# Patient Record
Sex: Female | Born: 1940 | Race: White | Hispanic: No | Smoking: Never smoker
Health system: Southern US, Community
[De-identification: ages and names within clinical notes are randomized; demographics above are authoritative.]

## PROBLEM LIST (undated history)

## (undated) DIAGNOSIS — I2510 Atherosclerotic heart disease of native coronary artery without angina pectoris: Secondary | ICD-10-CM

## (undated) DIAGNOSIS — E039 Hypothyroidism, unspecified: Secondary | ICD-10-CM

## (undated) DIAGNOSIS — E785 Hyperlipidemia, unspecified: Secondary | ICD-10-CM

## (undated) DIAGNOSIS — I4891 Unspecified atrial fibrillation: Secondary | ICD-10-CM

## (undated) DIAGNOSIS — I351 Nonrheumatic aortic (valve) insufficiency: Secondary | ICD-10-CM

## (undated) DIAGNOSIS — K219 Gastro-esophageal reflux disease without esophagitis: Secondary | ICD-10-CM

## (undated) DIAGNOSIS — I729 Aneurysm of unspecified site: Secondary | ICD-10-CM

## (undated) HISTORY — PX: NOSE SURGERY: SHX723

## (undated) HISTORY — DX: Unspecified atrial fibrillation: I48.91

## (undated) HISTORY — PX: ABDOMINAL HYSTERECTOMY: SHX81

## (undated) HISTORY — PX: PACEMAKER INSERTION: SHX728

## (undated) HISTORY — PX: NECK SURGERY: SHX720

## (undated) HISTORY — PX: BACK SURGERY: SHX140

## (undated) HISTORY — DX: Nonrheumatic aortic (valve) insufficiency: I35.1

## (undated) HISTORY — PX: TUBAL LIGATION: SHX77

## (undated) HISTORY — PX: FOOT SURGERY: SHX648

---
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
## 1998-01-09 ENCOUNTER — Inpatient Hospital Stay (HOSPITAL_COMMUNITY): Admission: EM | Admit: 1998-01-09 | Discharge: 1998-01-10 | Payer: Self-pay | Admitting: Cardiology

## 1998-01-09 ENCOUNTER — Encounter: Payer: Self-pay | Admitting: Cardiology

## 1998-01-10 ENCOUNTER — Encounter: Payer: Self-pay | Admitting: Cardiovascular Disease

## 1998-08-18 ENCOUNTER — Encounter: Payer: Self-pay | Admitting: Emergency Medicine

## 1998-08-18 ENCOUNTER — Inpatient Hospital Stay (HOSPITAL_COMMUNITY): Admission: EM | Admit: 1998-08-18 | Discharge: 1998-08-19 | Payer: Self-pay | Admitting: Emergency Medicine

## 2000-05-17 HISTORY — PX: CORONARY ANGIOPLASTY WITH STENT PLACEMENT: SHX49

## 2000-08-31 ENCOUNTER — Encounter: Payer: Self-pay | Admitting: Emergency Medicine

## 2000-08-31 ENCOUNTER — Observation Stay (HOSPITAL_COMMUNITY): Admission: EM | Admit: 2000-08-31 | Discharge: 2000-09-01 | Payer: Self-pay | Admitting: Emergency Medicine

## 2000-12-21 ENCOUNTER — Encounter: Payer: Self-pay | Admitting: Emergency Medicine

## 2000-12-21 ENCOUNTER — Inpatient Hospital Stay (HOSPITAL_COMMUNITY): Admission: EM | Admit: 2000-12-21 | Discharge: 2000-12-22 | Payer: Self-pay | Admitting: Emergency Medicine

## 2001-05-17 HISTORY — PX: CORONARY ANGIOPLASTY: SHX604

## 2001-11-29 ENCOUNTER — Encounter: Payer: Self-pay | Admitting: Cardiology

## 2001-11-29 ENCOUNTER — Ambulatory Visit (HOSPITAL_COMMUNITY): Admission: RE | Admit: 2001-11-29 | Discharge: 2001-11-30 | Payer: Self-pay | Admitting: Cardiology

## 2002-01-08 ENCOUNTER — Encounter: Payer: Self-pay | Admitting: *Deleted

## 2002-01-08 ENCOUNTER — Ambulatory Visit (HOSPITAL_COMMUNITY): Admission: AD | Admit: 2002-01-08 | Discharge: 2002-01-09 | Payer: Self-pay | Admitting: Cardiology

## 2002-01-18 ENCOUNTER — Inpatient Hospital Stay (HOSPITAL_COMMUNITY): Admission: EM | Admit: 2002-01-18 | Discharge: 2002-01-20 | Payer: Self-pay | Admitting: Emergency Medicine

## 2002-01-18 ENCOUNTER — Encounter: Payer: Self-pay | Admitting: Cardiovascular Disease

## 2002-03-13 ENCOUNTER — Encounter: Payer: Self-pay | Admitting: *Deleted

## 2002-03-13 ENCOUNTER — Emergency Department (HOSPITAL_COMMUNITY): Admission: EM | Admit: 2002-03-13 | Discharge: 2002-03-13 | Payer: Self-pay | Admitting: *Deleted

## 2002-05-17 HISTORY — PX: CORONARY ARTERY BYPASS GRAFT: SHX141

## 2002-06-05 ENCOUNTER — Ambulatory Visit (HOSPITAL_COMMUNITY): Admission: RE | Admit: 2002-06-05 | Discharge: 2002-06-06 | Payer: Self-pay | Admitting: Cardiovascular Disease

## 2002-09-26 ENCOUNTER — Inpatient Hospital Stay (HOSPITAL_COMMUNITY): Admission: AD | Admit: 2002-09-26 | Discharge: 2002-10-02 | Payer: Self-pay | Admitting: Cardiovascular Disease

## 2002-09-27 ENCOUNTER — Encounter: Payer: Self-pay | Admitting: Cardiovascular Disease

## 2002-09-28 ENCOUNTER — Encounter: Payer: Self-pay | Admitting: Thoracic Surgery (Cardiothoracic Vascular Surgery)

## 2002-09-29 ENCOUNTER — Encounter: Payer: Self-pay | Admitting: Thoracic Surgery (Cardiothoracic Vascular Surgery)

## 2002-09-30 ENCOUNTER — Encounter: Payer: Self-pay | Admitting: Thoracic Surgery (Cardiothoracic Vascular Surgery)

## 2002-12-10 ENCOUNTER — Encounter (HOSPITAL_COMMUNITY): Admission: RE | Admit: 2002-12-10 | Discharge: 2003-02-14 | Payer: Self-pay | Admitting: Cardiovascular Disease

## 2003-05-28 ENCOUNTER — Ambulatory Visit (HOSPITAL_COMMUNITY): Admission: RE | Admit: 2003-05-28 | Discharge: 2003-05-28 | Payer: Self-pay | Admitting: Unknown Physician Specialty

## 2005-03-24 ENCOUNTER — Ambulatory Visit (HOSPITAL_COMMUNITY): Admission: RE | Admit: 2005-03-24 | Discharge: 2005-03-24 | Payer: Self-pay | Admitting: *Deleted

## 2006-01-05 ENCOUNTER — Encounter
Admission: RE | Admit: 2006-01-05 | Discharge: 2006-04-05 | Payer: Self-pay | Admitting: Physical Medicine and Rehabilitation

## 2006-01-05 ENCOUNTER — Ambulatory Visit: Payer: Self-pay | Admitting: Physical Medicine and Rehabilitation

## 2006-02-18 ENCOUNTER — Ambulatory Visit: Payer: Self-pay | Admitting: Physical Medicine and Rehabilitation

## 2006-04-19 ENCOUNTER — Encounter
Admission: RE | Admit: 2006-04-19 | Discharge: 2006-07-18 | Payer: Self-pay | Admitting: Physical Medicine and Rehabilitation

## 2006-04-20 ENCOUNTER — Ambulatory Visit: Payer: Self-pay | Admitting: Physical Medicine and Rehabilitation

## 2006-06-14 ENCOUNTER — Encounter
Admission: RE | Admit: 2006-06-14 | Discharge: 2006-09-12 | Payer: Self-pay | Admitting: Physical Medicine and Rehabilitation

## 2006-06-14 ENCOUNTER — Ambulatory Visit: Payer: Self-pay | Admitting: Physical Medicine and Rehabilitation

## 2006-08-10 ENCOUNTER — Ambulatory Visit: Payer: Self-pay | Admitting: Physical Medicine and Rehabilitation

## 2006-09-13 ENCOUNTER — Encounter
Admission: RE | Admit: 2006-09-13 | Discharge: 2006-12-12 | Payer: Self-pay | Admitting: Physical Medicine and Rehabilitation

## 2006-09-13 ENCOUNTER — Ambulatory Visit: Payer: Self-pay | Admitting: Physical Medicine and Rehabilitation

## 2006-11-11 ENCOUNTER — Ambulatory Visit: Payer: Self-pay | Admitting: Physical Medicine and Rehabilitation

## 2007-01-10 ENCOUNTER — Encounter
Admission: RE | Admit: 2007-01-10 | Discharge: 2007-04-10 | Payer: Self-pay | Admitting: Physical Medicine and Rehabilitation

## 2007-01-10 ENCOUNTER — Ambulatory Visit: Payer: Self-pay | Admitting: Physical Medicine and Rehabilitation

## 2007-03-07 ENCOUNTER — Ambulatory Visit: Payer: Self-pay | Admitting: Physical Medicine and Rehabilitation

## 2007-04-21 ENCOUNTER — Ambulatory Visit: Payer: Self-pay | Admitting: Physical Medicine and Rehabilitation

## 2007-04-21 ENCOUNTER — Encounter
Admission: RE | Admit: 2007-04-21 | Discharge: 2007-07-20 | Payer: Self-pay | Admitting: Physical Medicine and Rehabilitation

## 2007-05-18 HISTORY — PX: BACK SURGERY: SHX140

## 2007-06-12 ENCOUNTER — Encounter: Admission: RE | Admit: 2007-06-12 | Discharge: 2007-06-12 | Payer: Self-pay | Admitting: Neurosurgery

## 2007-06-28 ENCOUNTER — Inpatient Hospital Stay (HOSPITAL_COMMUNITY): Admission: RE | Admit: 2007-06-28 | Discharge: 2007-06-30 | Payer: Self-pay | Admitting: Neurosurgery

## 2010-06-06 ENCOUNTER — Encounter: Payer: Self-pay | Admitting: Unknown Physician Specialty

## 2010-09-29 NOTE — Op Note (Signed)
NAMEJOHN, VASCONCELOS              ACCOUNT NO.:  [id number]   MEDICAL RECORD NO.:  [id number]          PATIENT TYPE:  INP   LOCATION:  3007                         FACILITY:  [facility]   PHYSICIAN:  Hilda Lias, M.D.   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   PROCEDURE:  Anterior C5-6, 6-7 diskectomy, decompression of spinal cord,  interbody fusion with allograft, plate, microscope.   SURGEON:  Hilda Lias, M.D.   ASSISTANT:  Hewitt Shorts, M.D.   CLINICAL HISTORY:  The patient was admitted because of neck pain with  radiation to both upper extremities associated with weakness.  X-rays  showed that she has stenosis and spondylosis at the level of 5-6, 6-7.  I have been following this lady almost a full year and she is getting  worse.  Surgery was advised.   PROCEDURE:  The patient was taken to the OR and after intubation the  neck was cleaned with DuraPrep.  Transverse incision was made through  the skin, subcutaneous tissue down to the cervical spine.  X-rays showed  that indeed we were at the level of 5-6.  Then we removed the anterior  osteophyte and with the microscope we opened the anterior ligament and  removal of degenerative disk was done.  The posterior ligament was  opened.  There was part of the posterior ligament which was calcified.  Decompression of the spinal cord as well as both C6 nerve roots was  achieved.  The same procedure with the same finding was done essentially  between 6 to 7.  Having good decompression of both levels, the endplate  were drilled and allograft with autograft inside was inserted.  The  height of both levels was 7 mm.  Then a plate using six screws was done.  X-rays showed good position  of bone graft.  Although we achieved a good  hemostasis nevertheless, this lady according to her, she has some  bleeding tendencies and because of that we decided to leave a drain.  Then the wound was closed with Vicryl and Steri-Strips.           ______________________________  Hilda Lias, M.D.     EB/MEDQ  D:  06/28/2007  T:  06/29/2007  Job:  [id number]

## 2010-09-29 NOTE — Assessment & Plan Note (Signed)
Evelyn [name] is a 70 year old married white female who is being followed  in our pain and rehabilitative clinic predominantly for cervicalgia,  intermittent left upper extremity pain, and occasional low backache.   She is back in today.  Does not need any refills of her medications.  She states that she has been thinking a little more about surgery lately  because of increased neck pain.  No history of any kind of injuries or  falls to exacerbate her pain.   She states her average pain is about an 8/10; however, in the clinic  today, it is a 0.  The pain when she does get it is described as aching,  and it is localized to the low cervical region and radiates to the left  upper extremity.   Her pain is typically worse with activity.  She gets good relief from  her pain with the Neurontin and Vicodin.   She is walking 15 minutes at a time.  She is able to climb stairs.  She  drives.  She is independent with her self-care.  No changes in review of  systems other than shortness of breath, and she maintains good contact  with her cardiologist and primary care physician.   No other changes in past medical, social, or family history since last  visit.   Medications prescribed by this clinic include Neurontin 300 mg, up to  twice daily, and Vicodin 5/500 1 p.o. up to twice daily as well.   PHYSICAL EXAMINATION:  Blood pressure is 121/58, pulse 53, respirations  18, 96% saturated on room air.  She is a well-developed and well-nourished female who does not appear in  any distress.  She is oriented x 3.  Speech is clear.  Affect is bright,  alert.  She is cooperative and pleasant, smiling and laughing  intermittently throughout our interview.   She transitions from sitting to standing quite easily.  Her gait in the  room is not antalgic. Tandem gait and Romberg's tests are assessed and  are performed adequately.  Some limitations are noted in cervical range  of motion, especially with  rotation to the left.  She has full shoulder  range of motion in both shoulders.  Reflexes are symmetric and intact in  the upper and lower extremities.  No abnormal tone is noted.  Toes are  downgoing with respect to Babinski's.  Sensory exam is intact to light  touch, pinprick, proprioception, and vibratory sensation in the feet.  Motor strength is 5/5 in the upper and lower extremities without focal  weakness.   IMPRESSION:  1. 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  2. Intermittent upper extremity radiculopathy.  3. Intermittent mild lumbago.   PLAN:  I reviewed Dr. Cassandria Santee surgical notes with her.  She initially  thought she may have had a thoracic spine problem; however, reviewing  his old notes and MRI scans, she understands it has been a cervical  spine issue.  I answered multiple questions regarding Dr. Cassandria Santee  previous recommendations with her.  At this point, she does not need any  refills on her medications.  She states that she does not feel her pain  warrants any surgical intervention at this point.  She will call Dr.  Jeral Fruit if she worsens.  Her neurologic exam is stable.  I did give her a  prescription for a  soft collar, which she can wear on a p.r.n. basis for intermittent neck  pain.  We will see her back in  two months.  She will call us if she  needs a refill on her Vicodin or her Neurontin.           ______________________________  Brantley Stage, M.D.     DMK/MedQ  D:  01/11/2007 11:25:44  T:  01/12/2007 00:18:43  Job #:  [id number]

## 2010-09-29 NOTE — H&P (Signed)
NAMEMarland Kitchen  Evelyn, [name] NO.:  [id number]   MEDICAL RECORD NO.:  [id number]          PATIENT TYPE:  INP   LOCATION:  3007                         FACILITY:  [facility]   PHYSICIAN:  Hilda Lias, M.D.   DATE OF BIRTH:  [birth year]   DATE OF ADMISSION:  06/28/2007  DATE OF DISCHARGE:                              HISTORY & PHYSICAL   Evelyn [name] is a lady who I have seen in my office for the past three  years with complaint of neck pain radiating to the left upper extremity,  to the point that she is not any better.  She was seen initially in 2005  and later about 10 days ago with pain going to both upper extremities.  Because of the findings, she is being admitted for surgery.   PAST MEDICAL/SURGICAL HISTORY:  Open heart surgery.   She is allergic to SULFA.   FAMILY HISTORY:  Unremarkable.   SOCIAL HISTORY:  She drinks socially.  Does not smoke.   REVIEW OF SYSTEMS:  Positive for shortness of breath, abdominal pain,  and also she has a history of some bleeding.   PHYSICAL EXAMINATION:  HEENT:  Normal.  NECK:  She has decreased flexibility of the cervical spine.  LUNGS:  There are some mild rhonchi bilaterally.  ABDOMEN:  Normal.  EXTREMITIES:  Normal.  NEURO:  Weakness of both biceps and wrist extensors. Decreased reflexes  in the level of the biceps.   The x-ray showed that she has severe spondylosis at the level of 5-6, 6-  7, and borderline between 3-4.   CLINICAL IMPRESSION:  C5-6, C6-7 spondylosis with chronic radiculopathy.   RECOMMENDATIONS:  Patient is being admitted for surgery.  The procedure  will be a anterior cervical diskectomy at the level of 5-6/6-7 with  fusion using __________ graft.  The surgery was fully explained to her  with the  possibility of infection, CSF leak, paralysis, stroke, need  for further surgery, and all of the risks associated with her history.           ______________________________  Hilda Lias, M.D.     EB/MEDQ  D:  06/28/2007  T:  06/29/2007  Job:  [id number]

## 2010-09-29 NOTE — Assessment & Plan Note (Signed)
[name] is a 70 year old married female who is being seen in our pain  and rehabilitative clinic for multiple pain complaints including  cervicalgia, intermittent left upper extremity pain, occasional low back  ache.  She has a history positive for coronary artery disease and has  undergone triple bypass procedure back in 2003 and has intermittent pain  from a sternotomy.  She has had this discomfort again on the last few  days, saw primary care physician, x-rays were ordered.  She has been  treated conservatively with it.  Pain is localized to the right  pectoralis region.   Current pain in the clinic is a 0 on a scale of 10, worse in the last  month has been about a 10.  Pain is described as dull and aching in  these areas including the posterior cervical region, low back and  currently the right chest wall is not bothering her since she took a  hydrocodone this morning.   Pain is exacerbated with some activities, improves with rest and  medication.  She is getting good relief with current medications  provided by our clinic.   ALLERGIES:  SULFA.   MEDICATIONS PROVIDED BY OUR CLINIC:  1. Vicodin 5/500 one p.o. b.i.d. p.r.n. pain #60.  2. Neurontin 300 mg b.i.d.   She is able to walk about 15 minutes at a time, is able to climb stairs  and drive.   She is independent with her self care, she is able to perform high level  household activities as well.  She has some intermittent numbness,  otherwise no complaints regarding review of systems.  No change in past  medical, social or family history.   EXAMINATION:  Today her blood pressure today is 105/59, pulse 61,  respirations 16, 96% saturated on room air.  She is a well-developed,  well-nourished female who appears her stated age, does not appear in any  distress.  She is oriented x3, her speech is clear, her affect is  bright.  She is alert, cooperative and pleasant.  She follows commands  without any difficulty.  She is  able to transition from sitting to  standing quite easily.  Gait in the room is normal.  Tandem gait,  Romberg's test are performed adequately.   Limitations are noted in cervical range of motion, especially with  rotation to the left.  She lacks approximately 15 degrees compared to  the right.  Forward flexion and extension do not exacerbate her pain.  She has full shoulder range of motion bilaterally.   She has a well-healed sternotomy scar.  She does have some tenderness  with palpitations in the right pectoralis region.   She has good strength however in both upper extremities and manual  muscle testing does not exacerbation her pain at this time.   She does have some acromial clavicular tenderness in the left shoulder.  Her reflexes are symmetric and intact in the upper extremities as well  as lower.  No abnormal tone is noted, no clonus is noted.  Motor  strength in the lower extremities is in the 5 over 5 range as well as in  the upper extremities.   IMPRESSION:  1. New sternotomy scar and right pectoralis muscle tenderness.  2. Cervical spondylosis with C5-6 cervicalgia.  3. Intermittent right upper extremity pain, may also be related to      cervical nerve root irritation, intermittent lumbago.   PLAN:  Ms. Schmader is then doing quite well on these  medications and is  able to maintain a very functional lifestyle.  We discussed treatment  options for her right anterior chest wall pain including physical  therapy or education on desensitization techniques as well as scar  mobilization.  She would like to defer this for now.  We also discussed  using Lidoderm patch in this area for the increased discomfort which  occurs on an occasional basis.  She will try the Lidoderm patches for  this.   I will refill her Vicodin 5/500 one p.o. b.i.d. p.r.n. neck, shoulder  pain #60, no refills, and we will refill her Neurontin 300 mg one p.o.  b.i.d. #60 with 3 refills.  She has not had  any complaints of over-  sedation or constipation from these medications.  She takes them as  directed.           ______________________________  Brantley Stage, M.D.     DMK/MedQ  D:  04/24/2007 10:40:47  T:  04/24/2007 12:09:39  Job #:  [id number]

## 2010-09-29 NOTE — Assessment & Plan Note (Signed)
Evelyn [name] is a pleasant 70 year old married female who was last seen in  our pain and rehabilitative clinic by me on January 11, 2007.  She has  had a nursing visit on February 08, 2007.  She is seen in our clinic  for cervicalgia, intermittent left upper extremity pain, and occasional  low backache.   She is back in today and reports that she does not need a refill on her  medication.  She states her pain averages about an 8 on a scale of 10  but it does not interfere at all with general activity, relationship  with others, or enjoyment of life.  She is able to sleep fairly well.  Pain improves with the use of medication.  She gets good relief with the  medication.  Pain is typically localized to across her scapula and  predominantly the left scapular region slightly over to the right  medially and the low back.   She is able to walk at least 15 minutes at a time.  She is able to climb  stairs and drive.   She is a high-functioning individual.  She is able to independently  perform her activities of daily living, as well as higher level hospital  activity.  She occasionally does get some shortness of breath, otherwise  review of systems is negative.   No changes in past medical, social, or family history.   MEDICATIONS PRESCRIBED BY OUR CLINIC:  1. Hydrocodone 5/500 one p.o. b.i.d. on a p.r.n. basis.  She brings in      27 pills left over from last month.  2. Neurontin.  She takes 300 mg twice a day.   EXAM:  Her blood pressure is 132/69.  Pulse 70.  Respirations 16, 98%  saturated on room air.  She is a well-developed, well-nourished female  who does not appear in any distress.  She is oriented x3.  Her speech is  clear.  Her affect is bright.  She is alert, cooperative, and pleasant.  She follows commands without any difficulty.  Transitions from sitting  to standing easily.  Gait in the room is stable, nonantalgic.  Tandem  gait and Romberg test are all performed adequately.   She has some  limitations in cervical, as well as lumbar motion.  Full shoulder range  is noted; however, she has intact reflexes in the upper and lower  extremities.  No abnormal tone is noted.  No clonus is noted.  Motor  strength is excellent as well.  No focal deficits are appreciated.  Sensory exam is intact to light touch.   IMPRESSION:  1. Cervical spondylosis, especially C5-6 with cervicalgia.  2. Intermittent left upper extremity pain most likely related to      cervical nerve root irritation.  3. Intermittent lumbago.   PLAN:  Evelyn [name] does not need any refills on her medications today.  She has no complications such as constipation or oversedation from these  medications.  Incidentally, she did recall that she started on a new  medication, Reclast.  She has been taking her pain medication, her  hydrocodone, sparingly.  She is not overusing this medication.  She  takes her Neurontin twice a day and finds it to be of benefit.  I will  see her back in 2 months.  Nursing visit next month.  If she needs a  refill on her hydrocodone, we will go ahead and call it in for her  before her next visit __________.  ______________________________  Brantley Stage, M.D.     DMK/MedQ  D:  03/08/2007 14:33:04  T:  03/09/2007 10:19:02  Job #:  [id number]

## 2010-10-02 NOTE — Discharge Summary (Signed)
NAME:  Evelyn [name], Evelyn [name]                          ACCOUNT NO.:  [id number]   MEDICAL RECORD NO.:  [id number]                   PATIENT TYPE:   LOCATION:                                       FACILITY:   PHYSICIAN:  Richard A. Alanda Amass, M.D.          DATE OF BIRTH:  1941/04/08   DATE OF ADMISSION:  06/05/2002  DATE OF DISCHARGE:  06/06/2002                                 DISCHARGE SUMMARY   HISTORY:  [name] is a 70 year old white married female patient  of Dr. 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  She does have a history of  chronic stable angina and coronary artery disease.  She apparently has a  single vessel left anterior descending disease on catheterization on December 21, 2000.  She received a stent to the LAD on November 29, 2001.  She had in  stent restenosis.  She had a cutting balloon angioplasty on January 08, 2002.  She had an IV ultrasound of her LAD.  She had proximal and distal stenosis  of the previous stent at 80%.  She received two stents to this area on  February 08, 2002.  She had recatheterization.  She had no restenosis.  She  came in to the office on June 01, 2002 with complaints of crescendo  angina, needing to use more nitroglycerin more frequent.  She had exercise  intolerance and the degree of pain was worse.  Thus, she was adamant about  having a cardiac catheterization instead of a Cardiolite because a previous  Cardiolite prior to her stents did not show any ischemia thus, a cardiac  catheterization was done.  This was performed on June 05, 2002 by Dr.  Susa Griffins.  She had in stent restenosis of her LAD.  She received  cutting balloon angioplasty and brachy therapy.  Please see his dictation  for complete details.  It appears looking at his note that her diagonal one  was also blocked by one of the stents.  On the morning of June 06, 2002  she was feeling much  improved.  Her laboratory are stable.  Her blood  pressure was slightly low, 104/54 to 110/38, her heart rate was 64 to 86,  her temperature was 97.8.  Room air saturations were 95%.  She had been up  in the halls walking.  Her right groin was only with a minor bruise.  It was  decided that she could be discharged home.  As an outpatient we had  discontinued her Norvasc and started her on Altace which she was tolerating.  We had also increased her Lipitor prior to hospitalization for an LDL of  111.   LABORATORY DATA:  On June 06, 2002 her hemoglobin was 10.5, hematocrit  30.7, platelets 182, WBC 6.  Sodium 141, potassium 3.5, BUN 7, creatinine  0.6, CK-MB 42/1.6.   DISCHARGE DIAGNOSES:  1. 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  2. Dyslipidemia.  Lipitor was increased on May 31, 2002.  3. Hypertension, controlled.  We had discontinued her Norvasc as an     outpatient and added Altace for improved endothelial function.  She is     tolerating this.  4. Osteoarthritis.  She takes Vioxx for arthritis.  5. Osteoporosis.  She is on Fosamax.  6. Gastroesophageal reflux disease.  She takes Nexium.     Lezlie Octave, N.P.                        Richard A. Alanda Amass, M.D.    BB/MEDQ  D:  06/06/2002  T:  06/07/2002  Job:  [id number]

## 2010-10-02 NOTE — Discharge Summary (Signed)
NAMEMarland Kitchen  Evelyn [name], Evelyn [name]                        ACCOUNT NO.:  [id number]   MEDICAL RECORD NO.:  [id number]                   PATIENT TYPE:  INP   LOCATION:  2005                                 FACILITY:  [facility]   PHYSICIAN:  Madaline Savage, M.D.             DATE OF BIRTH:  22-Oct-1940   DATE OF ADMISSION:  01/18/2002  DATE OF DISCHARGE:  01/20/2002                                 DISCHARGE SUMMARY   DISCHARGE DIAGNOSES:  1. Chest pain consistent with unstable angina, catheterization this     admission revealing patent left anterior descending stents.  2. Coronary disease, left anterior descending stenting July 2002, with     restenosis July 2003, treated with cutting balloon and restenosis again     August 2003, treated with an overlapping stent.  3. Controlled hypertension.  4. Treated hyperlipidemia.   HOSPITAL COURSE:  The patient is a 70 year old female with coronary disease  as described above.  The first stent was to the proximal LAD July 2002 and,  in July 2003, she had unstable angina and restenosis treated with cutting  balloon and angioplasty.  In August 2003, she had recurrent unstable angina,  and catheterization revealed an 80% stenosis, and this was treated with two  more stents.  She presented January 18, 2002, with recurrent chest pain  consistent with unstable angina.  She was seen by Dr. Allyson Sabal.  She was  started on IV heparin, IV nitrates, and set up for follow-up  catheterization.  Catheterization was done January 19, 2002, by Dr. Elsie Lincoln,  which revealed the RCA without significant disease; circumflex was normal.  LAD revealed three stent sites, all patent.  The patient had GI consult  during that hospitalization and was seen by Dr. Leone Payor with [facility].  After examination and interview of the patient, Dr. Leone Payor did not feel  endoscopy would reveal anything significant, and he recommended continued  treatment with Nexium and p.r.n. outpatient follow-up.   The patient was  discharged January 20, 2002.  She is okay to return to work on January 29, 2002.  Norvasc 2.5 mg was added.   FOLLOW UP:  She will follow up with Dr. Alanda Amass.   DISCHARGE MEDICATIONS:  1. Imdur 30 mg q.d.  2. Lipitor 20 mg q.d.  3. Nexium 40 mg q.d.  4. Toprol XL 50 mg q.d.  5. Plavix 75 mg q.d.  6. Aspirin 81 mg q.d.  7. Premarin 0.625 q.d.  8. Vioxx 12.5 mg q.d. if needed.  9. Norvasc 2.5 mg q.d.  10.      Nitroglycerin sublingual p.r.n.    LABORATORY DATA:  EKG shows sinus rhythm without acute changes.  Chest x-ray  shows no active disease.  White count 6.6, hemoglobin 11.6, hematocrit 34.5,  platelets 218, INR 0.9, sodium 140, potassium 3.8, BUN 16, creatinine 0.6.  Liver functions are normal.  CK-MB and troponins were negative x 3.  DISPOSITION:  The patient is discharged in stable condition and will follow  up with Dr. Alanda Amass.     Abelino Derrick, P.A.                      Madaline Savage, M.D.    Lenard Lance  D:  02/21/2002  T:  02/24/2002  Job:  [id number]

## 2010-10-02 NOTE — Discharge Summary (Signed)
NAMEMarland Kitchen  Evelyn [name], Evelyn [name]                        ACCOUNT NO.:  [id number]   MEDICAL RECORD NO.:  [id number]                   PATIENT TYPE:  INP   LOCATION:  2037                                 FACILITY:  [facility]   PHYSICIAN:  Salvatore Decent. Cornelius Moras, M.D.              DATE OF BIRTH:  [birth year]   DATE OF ADMISSION:  09/26/2002  DATE OF DISCHARGE:  10/02/2002                                 DISCHARGE SUMMARY   PRIMARY ADMITTING DIAGNOSIS:  Chest pain.   ADDITIONAL DISCHARGE DIAGNOSES:  1. Severe two-vessel coronary artery disease.  2. Class IV unstable angina.  3. History of hypertension.  4. Hyperlipidemia.  5. Gastroesophageal reflux disease.  6. Osteoporosis.   PROCEDURES PERFORMED:  1. Cardiac catheterization.  2. Coronary artery bypass grafting times three (left internal mammary artery     to the LAD, saphenous vein graft to the diagonal, saphenous vein graft to     the circumflex).  3. Endoscopic vein harvest, left thigh.   HISTORY:  The patient is a 70 year old female with a history of coronary  artery disease.  She has been followed by Dr. Alanda Amass.  She initially  developed single-vessel coronary artery disease, for which she underwent a  PTCA and stent placement to the LAD in August of 2002.  Around last summer,  she began to develop recurrent episodes of angina.  At that time,  catheterization showed restenosis within the stent.  She underwent cutting-  balloon dilatation for the end-stent restenosis at that time.  She continued  to have symptoms and underwent two further interventions in August of 2003.  In September of 2003, she underwent followup catheterization without  significant change and continued to have intermittent symptoms of chest  pain.  In January of 2004, she returned with worsening symptoms of recurrent  angina.  At that time, she underwent cutting-balloon atherectomy followed by  stent placements times two with coronary and brachytherapy.  Her symptoms  have continued to occur and have actually accelerated in frequency.  Over  the last two weeks, her symptoms have progressed considerably with  associated shortness of breath at rest and with activity.  She was seen by  Dr. Alanda Amass in the office on Sep 24, 2002, and was scheduled for repeat  cardiac catheterization.   HOSPITAL COURSE:  She was admitted on Sep 26, 2002, for cardiac  catheterization, which showed a long segment of end-stent restenosis of the  LAD as well as a newly-diagnosed osteostenosis of the LAD.  She underwent an  attempted PTCA and stent placement for the ostial LAD lesion, which was  unsuccessful.  Because of her accelerating symptoms and her failed  percutaneous intervention, cardiothoracic surgery consultation was obtained;  she was seen by Dr. Cornelius Moras, and it was agreed that surgical revascularization  was her best option at this point.  After the explanations of the risks,  benefits, and alternatives, she consented to proceed.  She was taken to the operating room on Sep 28, 2002, where she underwent  CABG times three, as described in detail above.  She tolerated the procedure  well and was transferred to the SICU in stable condition.  She was extubated  shortly after surgery.  She was hemodynamically stable and doing well on  postop day one.  At that time, she was able to be transferred to the floor.   Overall, she has done well postoperatively.  She has been mildly volume  overloaded and was started on Lasix for diuresis.  At the time of discharge,  she has been diuresed back down to within 4 pounds of her preoperative  weight.  She has remained in normal sinus rhythm, has been afebrile, and all  other vital signs have been stable.  Her surgical incision sites are healing  well.  She is ambulating in the halls with cardiac rehab phase one and is  doing very well.  She has been weaned off supplemental oxygen and is  maintaining O2 sats of greater than 90% on room  air.  It is felt that since  she is stable and doing well, she is ready for discharge home on Oct 02, 2002.   DISCHARGE MEDICATIONS:  1. Enteric-coated aspirin 325 mg daily.  2. Lopressor 25 mg t.i.d.  3. Lipitor 40 mg daily.  4. Premarin 0.625 mg daily.  5. Nexium 40 mg b.i.d.  6. Fosamax every week.  7. Lasix 40 mg daily times one week.  8. K-Dur 20 mEq daily times one week.  9. Multivitamin with iron daily.  10.      Tylox one to two q.4h. p.r.n. for pain.   DISCHARGE INSTRUCTIONS:   ACTIVITY:  1. She is to refrain from driving, heavy lifting, or strenuous activity.  2. She may continue daily walking and use of her incentive spirometer.  3. She is asked to shower daily and clean her incisions with soap and water.   DIET:  She will continue a low-fat, low-sodium diet.   DISCHARGE FOLLOW UP:  1. She is asked to schedule an appointment to see Dr. Alanda Amass in two     weeks.  2. The [facility] office will call her with an appointment to see Dr. Cornelius Moras in     three weeks.  She will have a chest x-ray at [facility]     one hour prior to this appointment and should bring the films for Dr.     Cornelius Moras to review.    SPECIAL INSTRUCTIONS:  She should call our office if she experiences any  problems or has questions in the interim.     Coral Ceo, P.A.                        Salvatore Decent. Cornelius Moras, M.D.    GC/MEDQ  D:  10/18/2002  T:  10/19/2002  Job:  [id number]   cc:   Gerlene Burdock A. Alanda Amass, M.D.  [phone] [address]  [place]  [place] [address]  Fax: [phone]   Colon Flattery, MD  [address]  [place]  [place] [address]  Fax: [phone]

## 2010-10-02 NOTE — Consult Note (Signed)
NAMEMarland Kitchen  Evelyn, [name]                        ACCOUNT NO.:  [id number]   MEDICAL RECORD NO.:  [id number]                   PATIENT TYPE:  OIB   LOCATION:  6524                                 FACILITY:  [facility]   PHYSICIAN:  Salvatore Decent. Cornelius Moras, M.D.              DATE OF BIRTH:  July 22, 1940   DATE OF CONSULTATION:  09/26/2002  DATE OF DISCHARGE:                                   CONSULTATION   REQUESTING PHYSICIAN:  Richard A. Alanda Amass, M.D.   REASON FOR CONSULTATION:  Severe two-vessel coronary artery disease, now  with left main equivalent, status post attempted percutaneous coronary  intervention on the osteal left anterior descending coronary artery with  class I stable angina.   HISTORY OF PRESENT ILLNESS:  Evelyn [name] is a 70 year old female from  [place] who is followed by Dr. Colon Flattery and Dr. Pearletha Furl. Alanda Amass  with history of coronary artery disease, hypertension, hyperlipidemia, and  GE reflux disease.  She apparently also has a history of rheumatic fever as  a child.  She initially developed single vessel coronary artery disease  involving the left anterior descending coronary artery for which she  underwent angioplasty and stent placement by Dr. Elsie Lincoln in August of 2002.  She did well until last summer when she developed recurrent symptoms of  angina.  Catheterization revealed restenosis within the previous stent.  She  underwent cutting balloon dilatation for in-stent restenosis at that time.  Her symptoms quickly redeveloped and she underwent 2 further interventions  August 2003.  In September 2003 she underwent follow up catheterization  without significant change, although she continued to have intermittent  symptoms of chest pain.  In January she returned with worsening symptoms of  recurrent angina.  At that time she underwent cutting balloon atherectomy,  followed by stent placement x 2 with coronary brachytherapy.  Since then she  has continued to suffer  from chest pain.  In recent weeks her symptoms of  angina have accelerated.  She now gets chest pain every day and frequently  several times a day.  She has severe headaches because of increasing doses  of oral nitrates.  She has had episodes of chest pain that are made worse by  physical activity, but that also occur at rest.  She has had worsening  shortness of breath both at rest and with physical activity.  Over the last  2 weeks symptoms have progressed considerably, prompting Evelyn [name] to  present to see Dr. Alanda Amass in the office on May 10.  She was promptly  scheduled for re-catheterization, which she had earlier today.  This  revealed evidence of long segment in-stent restenosis of the left anterior  descending coronary artery as well as newly appreciated osteal stenosis of  the left anterior descending coronary artery.  She subsequently underwent  attempted percutaneous coronary intervention and stent placement for the  osteal left anterior descending coronary artery.  This procedure was  unsuccessful, and following the completion of the procedure was notable for  presence of new haziness in the osteal portion of the left circumflex  coronary artery.  Left ventricular function is preserved.  Cardiac Surgical  consultation has been requested.   REVIEW OF SYSTEMS:  GENERAL:  The patient reports feeling well otherwise,  although her symptoms have progressed, recently related to unrelenting  angina that had dramatically effected her lifestyle and ability to go to  work.  She reports good appetite and has not been losing weight.  CARDIAC:  Notable for symptoms of class IV angina as well as symptoms of shortness of  breath both with activity and at rest.  The patient denies episodes of PND  or orthopnea.  She does have longstanding history of lower extremity edema,  which is much worse in the left lower extremity than the right.  She reports  frequent palpitations, but denies any  syncopal episodes.  RESPIRATORY:  Notable for occasional shortness of breath.  The patient denies productive  cough, hemoptysis, wheezing.  GASTROINTESTINAL:  Notable for occasional  symptoms of dyspepsia for which she is treated with Nexium.  She denies  history of hematochezia, hematemesis, melena.  She has not had problems with  constipation.  NEUROLOGICAL:  Notable for frequent headaches since she has  been on high-dose Imdur.  She denies transient visual disturbances and  reports no history of transient numbness or weakness involving either upper  or lower extremity.  MUSCULOSKELETAL:  Notable for osteoarthritis, partially  afflicting her right hip and right leg.  GENITOURINARY:  Negative.  INFECTIOUS:  Negative.  HEMATOLOGICAL:  Notable that the patient reports  longstanding bleeding diathesis.  This is worse with Plavix therapy, but she  notes that even before that she had a tendency to bleed easily and has had  problems with problems with previous surgeries relating to bleeding  complications.  ENDOCRINE:  Negative.  Although, the patient was recently  found to have elevated TSH level on baseline labs.  HEENT:  Negative.  PSYCHIATRIC:  Negative.  PERIPHEROVASCULAR:  Negative.   PAST MEDICAL HISTORY:  Notable for coronary artery disease, hypertension,  hyperlipidemia, GE reflux disease, osteoporosis.   PAST SURGICAL HISTORY:  Notable for surgery on both feet in the past,  hysterectomy with appendectomy in the remote past, tonsillectomy x 2 in the  remote past, tubal ligation.  The patient had reported bleeding  complications after her hysterectomy and her first tonsillectomy.   FAMILY HISTORY:  Notable for the absence of early onset coronary artery  disease.   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  She is a nonsmoker and denies significant alcohol   consumption.   MEDICATIONS PRIOR TO ADMISSION:  Include Plavix, aspirin, Toprol XL, Imdur,  Fosamax, Vioxx, Lipitor, Nexium, Premarin, Altace.   ALLERGIES:  THE PATIENT REPORTS DRUG SENSITIVITY TO SULFA.   PHYSICAL EXAMINATION:  GENERAL:  Notable for a well appearing white female  who appears stated age in no acute distress.  She has normal sinus rhythm  with heart rate 69 and blood pressure 109/63.  She is afebrile.  HEENT:  Within normal limits.  NECK:  Supple.  No cervical or supraclavicular lymphadenopathy.  No jugular  venous distention.  No carotid bruits are noted.  CHEST:  Auscultation of the chest demonstrates clear lung fields  bilaterally.  No wheezes or rhonchi are demonstrated.  CARDIOVASCULAR:  Reveals regular rate and rhythm.  No murmurs, rubs, or  gallops are noted.  ABDOMEN:  Flat, soft, nondistended.  There are no palpable masses.  The  liver edge is not enlarged.  EXTREMITIES:  Warm and well perfused.  There is no lower extremity edema.  Distal pulses are palpable in both lower legs in the dorsalis pedis  position.  There is no obvious venous insufficiency.  RECTAL/GU:  Both deferred.  NEUROLOGIC:  Grossly nonfocal and symmetrical throughout.   DIAGNOSTIC TESTS:  Cardiac catheterization performed today by Dr. Alanda Amass  is reviewed.  This demonstrates long-segment 70% in-stent restenosis of the  left anterior descending coronary artery.  There is a 70-80% osteal stenosis  of the left anterior descending coronary artery.  There is an 80% proximal  stenosis of the large first diagonal branch.  The distal left anterior  descending coronary artery is a relatively small vessel.  After attempted  percutaneous coronary intervention there is a hazy 30% osteal stenosis of  the left circumflex coronary artery.  The right coronary artery is dominant  and without disease.  Left ventricular function appears preserved.   IMPRESSION:  Severe two-vessel coronary artery disease  with long segment in-  stent restenosis of the left anterior descending coronary artery, plus  attempted but unsuccessful percutaneous coronary intervention of osteal left  anterior descending coronary artery stenosis, now with new osteal stenosis  of the left circumflex coronary artery.  The patient has class IV symptoms  of angina and preserved left ventricular function.  I believe the best  course of therapy is surgical revascularization.  The patient has  longstanding treatment with Plavix as well as additional history of possible  bleeding diathesis.  However, with the potentially undisturbed nature of her  coronary circulation due to attempted stent placement in the osteal left  anterior descending coronary artery, I feel that a prolonged delay prior to  surgery would carry with it considerable risk.   PLAN:  We tentatively plan to proceed with surgery, first case Friday for coronary artery bypass grafting.  Evelyn [name] and her husband understand and  accept all associated risks of surgery, including but not limited to risk of  death, stroke, myocardial infarction, congestive heart failure, respiratory  failure, bleeding requiring blood transfusion, arrhythmia, infection,  recurrent coronary artery disease.  They understand the increased risk of  associated bleeding complications due to longstanding history of therapy  with platelet agents including Plavix as well as the patient's otherwise  history of possible bleeding diathesis.  All of their questions have been  addressed.                                               Salvatore Decent. Cornelius Moras, M.D.    CHO/MEDQ  D:  09/26/2002  T:  09/27/2002  Job:  [id number]   cc:   Gerlene Burdock A. Alanda Amass, M.D.  [phone] [address], [address]  [place]  [place] [address]  Fax: [phone]   Colon Flattery, MD  [address]  [place]  [place] [address]  Fax: [phone]

## 2010-10-02 NOTE — Cardiovascular Report (Signed)
NAMEMarland Kitchen  Evelyn [name], Evelyn [name]                        ACCOUNT NO.:  [id number]   MEDICAL RECORD NO.:  [id number]                   PATIENT TYPE:  OIB   LOCATION:  6529                                 FACILITY:  [facility]   PHYSICIAN:  Richard A. Alanda Amass, M.D.          DATE OF BIRTH:  Nov 23, 1940   DATE OF PROCEDURE:  06/05/2002  DATE OF DISCHARGE:                              CARDIAC CATHETERIZATION   PROCEDURES:  Retrograde central aortic catheterization, selective coronary  angiography pre and post intracoronary nitroglycerin administration, left  ventricular angiogram RAO and LAO projection, subselective left internal  mammary artery, weight-adjusted heparin, Aggrastat bolus plus infusion, IVUS  interrogation, in-stent restenosis, segmental proximal LAD, Cutting Balloon  arthrectomy in-stent restenosis triple tandem left anterior descending  stents, post dilatation with upgraded 2.75 balloon, two dilated to 3.1 mm,  brachytherapy with P32 triple position Galileo beta system, dwell 364  seconds, 20 Gy.  Continued aspirin and Plavix.   BRIEF HISTORY:  The patient's history is well outlined.  The patient is a  very pleasant married mother of one with no grandchildren. She is a past  smoker, has a family history of premature coronary artery disease,  hypertension, hyperlipidemia, GERD.  She had essentially normal coronary  arteries August 1999 on catheterization by myself.  She developed coronary  disease and was found to have high-grade proximal LAD stenosis of greater  than 75% August 2002 and underwent stenting with a 2.5/18 Cordis Hepacoat  stent by Dr. Elsie Lincoln.  She subsequently had restenosis with recurrent  symptoms and on July 2003 underwent Cutting Balloon dilatation with a 2.5  Cutting Balloon.  She had recurrent angina and was studied by Dr. Jenne Campus,  January 08, 2002, had high-grade in-stent restenosis plus stenosis proximal  and distal to the previously placed LAD stent requiring  tandem 2.25/12 and  2.75/12 Express II stents placed.  She was restudies September 2003 with no  restenosis.  She now comes in with recurrent chest pain compatible with  ischemia.  She was setup for possible brachytherapy with good clinical  history for recurrent angina and possible ISR.  Informed consent was  obtained to proceed with diagnostic angiography. She was admitted on the  same day of admission, had been on outpatient aspirin and Plavix.  Preoperative creatinine was 0.6.  There is no history of diabetes and TSH  _________ and lipids pending.   DESCRIPTION OF PROCEDURE:  The right groin was prepped, draped in the usual  manner.  Then 1% Xylocaine was used for local anesthesia.  The RFA was  entered with a single anterior puncture using an 18 thin wall needle and a 6  French short Daig side-arm sheath was inserted without difficulty.  Catheterization was done with a 6 French 4 cm taper preformed Cordis  coronary and pigtail catheters using Omnipaque dye throughout the procedure.  IC nitroglycerin was given with repeat left coronary injection in multiple  projections.  Subselective LIMA  revealed no subclavian stenosis and widely  patent LIMA.   The patient was given a total of 4 mg of Nubain for sedation in the lab and  2 mg of Zofran from transient nausea during the procedure.  She was given an  extra 75 of Plavix p.o.   LV angiogram was done in the RAO and LAO projections, 25 cubic centimeter 14  cubic centimeter per second, 20 cubic centimeter 12 cubic centimeter per  second.  Pullback pressure of the CA was performed, showed no gradient  across the aortic valve. The patient tolerated the diagnostic procedure  well.   PRESSURES:  LV:  170/0, LVEDP 16/18 mmHg.  CA:  170/85 mmHg.  Post IC  nitroglycerin pressures came down to 140 mmHg systolic.   Fluoroscopy showed the triple tandem LAD stents positioned just beyond the  large diagonal #1 branch of the LAD.  There was no  significant intracardiac  or coronary calcification visualized.   LV angiogram demonstrated a normally contracting ventricle, EF approximately  55%.  No segmental wall motion abnormalities.  Angiographic MVP was present  with no MR.   The right coronary was a dominant vessel, moderately large with normal PDA  and PLA distally and two normal RV branches in the proximal and midportion.  It was widely patent and smooth throughout its course with no stenosis.   The main left coronary had no significant stenosis.   The first diagonal was a large vessel and had 30% smooth narrowing in its  proximal portion with good residual lumen and normal TIMI-3 flow that  bifurcated in its midportion and was a very large near twin diagonal.   It was difficult to tell if the triple tandem stents angiographically came  up to the origin of the diagonal  #1.  Just beyond the diagonal #1, there were tandem 85% irregular concentric  lesions compatible with restenosis, ISR.  There was another tandem 90%  lesion just beyond this and there was 60% in the distal third of the stents  which extended to the midportion of the LAD.  There were several septals  coming proximal to the stent and within the stent, and there was a very  small diagonal branch in the mid LAD. The LAD coursed to the apex of the  heart.  There was no significant stenosis beyond the stents.   This patient has recurrent symptoms and triple tandem stents, and it was  felt best to do IVUS interrogation for sizing of the vessel evaluating the  ISR and deciding whether any new stents need to be positioned just up to the  diagonal to assess whether brachytherapy would be the treatment of choice  and/or DES stenting. We did not want to consider brachytherapy if we were  going to put any new stents in place.   The patient was given weight-adjusted heparin 3000 units.  ACT was 299.  Following this, Aggrastat bolus plus infusion was started.  The  left main was intubated with a 6 French FL4 Scimed guiding catheter.  A  Forte Scimed 0.014 inch marker wire was then used under fluoroscopic control  to cross the in-stent restenosis (ISR) and was free in the distal LAD.  A  Atlantis Scimed IVUS catheter was then advanced over the guide wire and  pullback distal to the stent to the proximal LAD was performed.  This showed  moderate stenosis of approximately 60-70% in the distal third of the stent.  The stent was under expanded with  some plaque beyond it. The reference  diameter was about 26-27 within the stent.  However, the vessel measured  about 28 diameter.  The proximal portion of the stent had high-grade  stenosis of 80-85% with significant ISR and fibrotic type plaque.  The stent  was seen to extend beautifully just to the origin of the large diagonal #1.   In the reference diameter just beyond the diagonal #1 of the vessel was  approximately 3 mm outside the stent.   It was felt that all of the stenosis was ISR within the stent.  For this  reason, we used a 2.5/10 Cutting Balloon using exchange technique with the  IVUS over the Forte wire.  Dilatations were done in the mid LAD distal  portion of the stent at 10-52, the proximal portion 8-52, and in the  midportion 10-57 and 12-48.   The Cutting Balloon was then exchanged for a 2.75/20 Maverick Scimed balloon  and high pressure inflations were done up to 17 atmospheres in the mid,  distal and proximal portion with 15 to 45 seconds.  The patient had chest  pain with these inflations promptly relieved with deflation and tolerated  this well.   We then proceeded to do brachytherapy with P32 guiding [facility] system.  A 52  mm x 3.0 mm Galileo triple-step brachytherapy catheter was then advanced  over the wire through the 6 [place] guiding catheter positioned appropriately  across the lesion and the stents.  Triple pullback dwell time was a total of  364 seconds delivering  approximately 20 Gy to the ISR.  The brachytherapy  catheter was removed.  Final injection showed excellent angiographic result  with stenosis reduction to less than 10% with no dissection, good flow to  the twin diagonal #1 and good flow to the distal LAD.  The dilatation system  was removed.  Final ACT was obtained which was therapeutic (see sheet).  The  guide wire and system were removed.  Side-arm sheath was flushed, secure to  the skin with #1 silk suture to prevent migration.  The patient was brought  to the holding area for ACT measurement and sheath removal with pressure  hemostasis when appropriate.  She tolerated the procedure well.   We plan to continue aspirin and Plavix now at present, medical therapy of  her hypertension, associated problems such as hyperlipidemia. Hopefully she  will not have a restenosis, status post Cutting Balloon arthrectomy, post-  dilatation and brachytherapy now.  Should she have a restenosis, she could potentially be a candidate for sandwich stenting with DES stents.  She would  also be a candidate for a single-vessel LAD bypass.   CATHETERIZATION DIAGNOSES:  1. Arteriosclerotic heart disease.  Recurrent angina with past tandem left     anterior descending stenting, January 08, 2002.  2. Remote left anterior descending stent,August 2002.  3. Cutting Balloon arthrectomy for in-stent restenosis, July 2003.  4. Two tandem proximal and distal stents placed, January 08, 2002, for in-     stent restenosis (Dr. Jenne Campus).  5. No restenosis, September 2003.  6. Recurrent angina, proximal, distal in-stent restenosis, current study,     June 05, 2002, treated with Cutting Balloon arthrectomy, upgraded     balloon percutaneous transluminal coronary angioplasty and subsequent     brachytherapy as outlined.  7. Hypertension.  8. Hyperlipidemia.  9. Osteoporosis.  10.      Gastroesophageal reflux disease.  11.      Remote lumpectomy, no recurrence.  Richard A. Alanda Amass, M.D.    RAW/MEDQ  D:  06/05/2002  T:  06/06/2002  Job:  [id number]   cc:   CP Lab   Colon Flattery  [address]  [place]  [place] [address]  Fax: [phone]   Nanetta Batty, M.D.  [phone] [address]  [place]  [place] [address]  Fax: [phone]

## 2010-10-02 NOTE — Assessment & Plan Note (Signed)
Evelyn [name] is a pleasant 70 year old married female who is seen in our pain  rehabilitative clinic for left upper extremity pain and interscapular pain.  She was last seen January 07, 2006, referred by Dr. Jeral Fruit.  She had been  using hydrocodone for her periscapular pain and shoulder pain not more than  1-2 a day, typically.  She has had her recent prescription filled by her  primary care physician.  She also uses Mobic on a p.r.n. basis, not more  than three days per week.  She is back in today and reports her average pain  is about a 6 on a scale of 10 described as dull and burning.  Sleep is fair.  Interferes very little with her general activity.  She gets good relief with  the current meds that she is on.  She did try Lyrica last month and found it  would help get to sleep, however, she typically would awaken at 1:30 a.m.  and be quite wide awake and this was disturbing for her.  She did  not wish  to stay on the medication.   She does not have any problems controlling bowel or bladder.  She denies  depression or anxiety.  Denies suicidal ideation.  She does admit to some  numbness  in the left upper extremity intermittently.   There is no new changes in her past medical, social, or family history.  Interestingly, she did mention that when she has had some ischemic heart  disease, she had some radiating scapular pain in addition to chest pain.   On exam today, her blood pressure is 119/70, pulse 77, respirations 17, 97%  saturation on room air.  She is a well developed, well nourished female who  appears her stated age.  She does not appear in any distress.  She is  oriented x3.  Her speech is clear.  Her affect is bright, alert,  cooperative, pleasant, follows commands without any difficulty.  She  transitions from sit to stand without any problems.  Gait is normal.  She  has mild limitation in cervical range with rotation right and left.  Full  shoulder range of motion without  pain today is appreciated, as well.  Seated  reflexes are 2+ in the upper and lower extremities.  Motor strength is  excellent, no focal weakness appreciated.  No abnormal tone is noted.  No  clonus is noted.  Rombergs and tandem gait are performed adequately.  Balance and coordination are intact, as well.   IMPRESSION:  1. Cervical spondylosis.  2. Mild left upper extremity radiculopathy, variable in intensity.   PLAN:  We will trial her on Neurontin 100 mg one p.o. q.h.s.  She believes  she may have tried this medication years ago.  She is not sure whether it  made her nauseated or not, does not recall any significant adverse  reactions.  We will also give her samples of Lidoderm and Biofreeze.  I have  discussed the risks and benefits with these and proper use and cautions  regarding their use.  Anticipate we will be refilling her Hydrocodone in the  next month.  We will see her back in one month.  May consider increasing  Neurontin at that time.  She was started on 100 mg one p.o. q.h.s., #30.           ______________________________  Brantley Stage, M.D.     DMK/MedQ  D:  02/21/2006 13:44:14  T:  02/22/2006 16:26:35  Job #:  [id number]

## 2010-10-02 NOTE — Assessment & Plan Note (Signed)
Wednesday, April 20, 2006:   [name] is a 70 year old female who is being seen in our pain and  rehabilitation clinic for left upper extremity pain and intrascapular  pain.   She was last seen by me on March 21, 2006. She was referred by Dr.  Jeral Fruit.   She states her average pain is about a 10 on a scale of 10, however, she  gets good relief with the current meds.   She describes her pain as being dull, burning and aching in nature  throughout the interscapular region and down the left upper extremity.  Left upper extremity pain occurs almost every day, is worse with  activity, seems to improve with rest and medication.   Current meds from this clinic include Vicodin 5/500 one p.o. q. day to  b.i.d. and Neurontin 300 mg one p.o. t.i.d.   Functional status:  The patient is able to walk up to 20 minutes at a  time. She is driving, she is independent with all of her self-care. She  is a high-functioning woman.   Review of systems is positive for numbness and tingling left upper  extremity, otherwise negative.   She is followed by Dr.Weintraub for coronary artery disease. Next  appointment with Dr. Alanda Amass is next Friday.   Social, family history otherwise unchanged.   EXAMINATION:  Blood pressure 129/67, pulse 78, respirations 16, 98%  saturated on room air. She is a thin adult female who appears her stated  age. She is oriented x3. Her affect is bright, alert, cooperative and  pleasant. Speech is clear. She follows commands easily. She transitions  from sit to stand easily. Her gait in the room is stable. She has normal  Romberg's test, normal tandem gait. She has some very mild limitations  in rotation right with assessment of her cervical range of motion. She  has full shoulder range of motion without pain.   Seated reflexes in the upper extremity and lower extremity are symmetric  and intact. There is no abnormal tone appreciated. She has full strength  in the  upper extremities on manual muscle testing. No focal sensory  deficits are appreciated with light touch.   She has some tenderness at approximately T5-T6 with palpation. She  states that this has been tender since she had a compression fracture  years ago.   IMPRESSION:  1. Cervical spondylosis.  2. Mild left upper extremity radiculopathy which is variable in its      intensity throughout the month.   PLAN:  We will refill her Vicodin 5/500 one p.o. q. day to b.i.d. #45.  Will also refill her Neurontin 300 mg one p.o. t.i.d. #90 with two  refills.   She states she has tolerated the Neurontin well. She was concerned that  it may have caused her some gastric upset, however, it did not, and she  has been taking it three times a day. Notices some mild fatigue  associated with it right after she takes it; however, this dissipates.  She feels she is getting some good pain relief with the left upper  extremity pain with its use.   On occasion when she does have flare-ups, we discussed appropriate use  of Tylenol and nonsteroidal antiinflammatory medication. She still does  have medications provided through Dr. Yetta Barre. She takes generic Mobic 7.5  mg on a p.r.n. basis. Will have nursing staff refill her medications in  one month and I will see her back after the holidays. She has  been  stable on these medications provided by this clinic without any adverse  reactions. She does not display any aberrant behavior.           ______________________________  Brantley Stage, M.D.     DMK/MedQ  D:  04/20/2006 10:03:55  T:  04/20/2006 14:52:40  Job #:  [id number]

## 2010-10-02 NOTE — Procedures (Signed)
   Evelyn [name], Evelyn [name]                          ACCOUNT NO.:  [id number]   MEDICAL RECORD NO.:  [id number]                    PATIENT TYPE:   LOCATION:                                       FACILITY:   PHYSICIAN:  Edward L. Juanetta Gosling, M.D.             DATE OF BIRTH:   DATE OF PROCEDURE:  03/13/2002  DATE OF DISCHARGE:                                EKG INTERPRETATION   TIME AND DATE:  1221 on 03/13/02   INTERPRETATION:  The rhythm is sinus rhythm with a rate in the 60s.  Small T  waves are seen inferiorly and clinical correlation is suggested as to  significance.  There are nonspecific ST-T wave changes.   IMPRESSION:  Abnormal electrocardiogram.                                               Edward L. Juanetta Gosling, M.D.    ELH/MEDQ  D:  03/13/2002  T:  03/15/2002  Job:  [id number]

## 2010-10-02 NOTE — Cardiovascular Report (Signed)
NAMEMarland [name]  DESTANEE, BEDONIE                        ACCOUNT NO.:  [id number]   MEDICAL RECORD NO.:  [id number]                   PATIENT TYPE:  INP   LOCATION:  2005                                 FACILITY:  [facility]   PHYSICIAN:  Madaline Savage, M.D.             DATE OF BIRTH:  1941-03-19   DATE OF PROCEDURE:  01/19/2002  DATE OF DISCHARGE:  01/20/2002                              CARDIAC CATHETERIZATION   PROCEDURES PERFORMED:  1. Selective coronary angiography by Judkins technique.  2. Retrograde left heart catheterization.  3. Left ventricular angiography.  4. Intravascular ultrasound of left anterior descending coronary artery.  5. Intravascular ultrasound of the first diagonal branch of the left     anterior descending coronary artery.   COMPLICATIONS:  None.   ENTRY SITE:  Right femoral.   DYE USED:  Omnipaque.   PATIENT PROFILE:  The patient is a 70 year old woman who has had three  previous catheterization lab visits this year.  During those procedures, she  has had LAD percutaneous interventions, which will be described below, the  last one being on January 08, 2002.   She is now admitted for unstable anginal sounding chest pain that occurred  one to two days after the last interventional procedure was performed.  The  past history shows that on December 21, 2000, she had an LAD angioplasty and  stent performed by me to the mid LAD.  On November 29, 2001, she had an  angioplasty with a cutting balloon for in-stent restenosis.  On January 08, 2002, she had placement of two Express 2 Monorail stents distal to the  original stent and proximal to the original stent.   The first stent placed on December 21, 2000, was a 2.5 x 18 mm BX Velocity  stent with Hepacoat.  The stent placed distal to the first stent was an  Express 2 Monorail 2.25 x 12.  The third stent was placed proximal to the  original BX Velocity stent and that was a 2.75 x 12 Express Monorail stent.  Today's procedure  was performed on an inpatient basis electively.   RESULTS:  Pressures:  The left ventricular pressure was 120/16.  The central  aortic pressure was 125/70 with a mean of 90.  No aortic valve gradient by  pullback technique.   Angiographic Results:  By fluoroscopy, the only radio-opaque portions of the  cardiac silhouette were overlapping stents in what appeared to be the LAD.  The left main coronary artery was medium in length and showed no lesions.   Angiographically, the LAD is widely patent throughout its course.  There are  three overlapping stents starting in the proximal LAD just distal to the  take-off of a large diagonal branch.  The second stent appears to originate  just distal to the first septal perforator branch.  The third radio-opaque  stent originates just distal to the original BX Velocity stent.  I  previously stated that the LAD was widely patent, however, after  intracoronary nitroglycerin 200 mg was given, this vessel definitely  plumped up and the vasodilatory effect of the nitroglycerin equalized the  lumen diameter so that there appeared to be no step up between the proximal  unstented portion of the LAD and the distal unstented portion of the LAD.   The diagonal likewise showed no evidence of any obstructive lesions and even  the ostium of the diagonal showed no more than about a 30% area of luminal  irregularity.   The circumflex consisted of a large bifurcating obtuse marginal branch and a  medium-sized distal circumflex.  No lesions were seen.   The right coronary artery was large and dominant with no lesions seen.   The left ventricle showed normal contractility with no wall motion  abnormalities and I estimated the ejection fraction qualitatively at 55%  with no mitral regurgitation.   Intravascular ultrasound procedure:  This was performed by obtaining a  baseline ACT which was 150 and subsequently the patient was given 2000 units  of systemic  heparin.  A later ACT was found to be in excess of 300.   The guide catheter for the IVUS procedure was a 6 French left Judkins guide  catheter with a 4 cm tip.  The guide wire was a Radiographer, therapeutic.  The initial  Atlantis IVUS balloon was placed distally in the LAD and was pulled back in  the usual fashion.  We noted the distal vessel to measure approximately 2.5.  We saw the first stent to show a smooth interface between the distal LAD and  the first stent.  The first overlapped portion of the stent appeared to be a  smooth transition as did the later second area of stent overlap between  proximal and mid stents.  All three stents were well deployed.  Overlap  sites with __________  and no intraluminal plaque was noted.  It was noted  that the ostium of the LAD contained some plaque at 12 o'clock and at 3  o'clock.  The vessel lumen appeared to be 2.5 x 2.9 and the area was 6.2 sq  mm.   IVUS of the diagonal branch showed the vessel to be virtually 2.4 x 2.9 mm  in diameter and showed no evidence of any major obstruction.  There was  trivial plaque noted at the ostium.   The patient tolerated the procedure very well.  I explained the procedure to  her as we went.  We finished the case and sewed in the sheath because of the  ACT being in excess of 300 and plan to recover her in the recovery room of  the catheterization lab.  The patient is already on p.o. nitrates, as well  as beta blocker.  We will give her a calcium blocker as well to minimize  spasm.  We will also plan GI evaluation.   FINAL DIAGNOSES:  1. Patent left anterior descending stents to left anterior descending with     no residual stenosis.  2.     No significant stenosis in diagonal, circumflex, or right coronary artery.  3. Normal left ventricular systolic function.   PLAN:  As stated above.  Madaline Savage, M.D.   WHG/MEDQ  D:  01/19/2002  T:  01/21/2002  Job:   [id number]   cc:   Gerlene Burdock A. Alanda Amass, M.D.  [phone] [address], [address]  [place]  [place] [address]  Fax: [phone]   Catheterization Lab

## 2010-10-02 NOTE — Cardiovascular Report (Signed)
[place]  Patient:    Evelyn [name], Evelyn [name]                     MRN: [id number] Proc. Date: 12/21/00 Adm. Date:  [id number] Attending:  Ruta Hinds CC:         Richard A. Alanda Amass, M.D.  Cardiac Catheterization Laboratory   Cardiac Catheterization  PROCEDURES PERFORMED: 1. Intracoronary artery stenting of the mid left anterior descending coronary    artery. 2. Selective coronary angiography via Judkins technique. 3. Retrograde left heart catheterization. 4. Left ventricular angiography. 5. Predilatation of mid left anterior descending artery with balloon    angioplasty.  COMPLICATIONS:  None.  ENTRY SITE:  Right femoral.  DYE USED:  Omnipaque.  MEDICATIONS GIVEN:  Versed 1 mg IV, which worked well; weight adjusted Aggrastat bolus and drip.  CLINICAL PRESENTATION:  The patient is a 70 year old married female who is a cardiology patient of Dr. Alanda Amass, who has previously had cardiac catheterization in 1999, showing mild coronary disease.  She has had one week of unstable chest pain that has been nitrate responsive and has been provoked by activities.  The EKG in the emergency room failed to show ischemic change, but based on the clinical history she was brought to the catheterization laboratory electively after putting her on intravenous heparin and nitroglycerin.  This case was performed on an inpatient basis electively.  RESULTS:  PRESSURES: 1. Left ventricular pressure was 120/17. 2. Central aortic pressure was 120/60, mean of 85. 3. No aortic valve gradient by pullback technique.  ANGIOGRAPHIC RESULTS:  The left main coronary artery appeared normal.  The left circumflex coronary artery gave rise to one obtuse marginal branch and was a nondominant vessel.  No lesions were seen.  The LAD and diagonal were almost equal in size.  The LAD contained a 75% stenosis with a distal area of hypodense dilatation just after the  first septal perforator and extending to just beyond the second septal perforator branch over an area of 16-18 mm in length.  The distal LAD was normal in appearance.  The right coronary artery was a dominant vessel which gave rise to one major acute marginal branch and a twin pair of posterior descending branches and a twin pair of posterolateral continuation branches.  All spots in the RCA were patent.  The left ventricle showed excellent contractility.  We calculated an ejection fraction at 69%.  There was no mitral regurgitation, no LV thrombus, and no wall motion abnormalities.  PERCUTANEOUS INTERVENTION:  This was performed with a 7-French introducer sheath in the right groin.  The guide catheter was a 7-French #4 left Judkins-type guide catheter without side-holes.  The guide wire used was a 185 cm luge wire.  The predilatation balloon used was a 2.5 x 15 mm Maverick balloon, which was inflated to 6 atm of pressure with good result.  I then deployed a 2.5 x 18 mm Hepacoat stent (BX Velocity by Cordis) and deployed it twice, once to 14 atm and then to 16 atm.  I then did an inflation to 6 atm with some distal overlap into the native coronary and a 6 atm inflation into the proximal portion of the stent into the native coronary.  After these four inflations were performed, the 75% stenosis was reduced to 0% residual and TIMI-3 class flow was preserved and chest pain and ST segments which occurred during inflation, which reproduced the patients clinical pain, was relieved.  The  ACT during the case was approximately 240.  Aggrastat was infused.  No bleeding complication, hemodynamic complications, or other complications occurred.  FINAL DIAGNOSES: 1. Single-vessel coronary artery disease of the mid left anterior descending    artery with 75% stenosis. 2. Normal left ventricular systolic function. 3. Successful intracoronary artery stenting of the mid left anterior    descending  artery with reduction of the 75% lesion to 0% residual. DD:  12/21/00 TD:  12/22/00 Job: [id number]

## 2010-10-02 NOTE — Op Note (Signed)
NAMEMarland Kitchen  Evelyn [name], Evelyn [name]                        ACCOUNT NO.:  [id number]   MEDICAL RECORD NO.:  [id number]                   PATIENT TYPE:  INP   LOCATION:  2037                                 FACILITY:  [facility]   PHYSICIAN:  Salvatore Decent. Cornelius Moras, M.D.              DATE OF BIRTH:  1940-10-12   DATE OF PROCEDURE:  09/28/2002  DATE OF DISCHARGE:                                 OPERATIVE REPORT   PREOPERATIVE DIAGNOSIS:  Severe two-vessel coronary artery disease, with  class-IV stable angina.   POSTOPERATIVE DIAGNOSIS:  Severe two-vessel coronary artery disease, with  class-IV stable angina.   PROCEDURE:  Median sternotomy for coronary artery bypass grafting times  three (left internal mammary artery to the distal left anterior descending  coronary artery, saphenous vein graft to first diagonal branch, saphenous  vein graft to the circumflex marginal branch).   SURGEON:  Salvatore Decent. Cornelius Moras, M.D.   ASSISTANT:  Coral Ceo, P.A.   ANESTHESIA:  General.   BRIEF CLINICAL NOTE:  The patient is a 70 year old female with history of  coronary artery disease, hypertension, hyperlipidemia.  She is followed by  Dr. Colon Flattery and Dr. Susa Griffins.  She has undergone numerous  percutaneous coronary interventions on the left anterior descending coronary  artery.  She returns with symptoms of class-IV unstable angina.  She  underwent another attempted percutaneous coronary intervention on May 12th,  but this procedure was unsuccessful.  A full consultation note has been  dictated previously.  Left ventricular function is preserved.  The patient  and her husband have been counseled at length regarding the indications,  risks, and potential benefits of coronary artery bypass grafting.  They  understand and accept all associated risks of surgery and desire to proceed  as described.   OPERATIVE NOTE IN DETAIL:  The patient is brought to the operating room on  the mentioned date and placed in  the supine position on the operating table.  Central monitoring is established by the anesthesia service under the care  and direction of Dr. Bedelia Person.  Specifically, a Swan-Ganz catheter is  placed through the right internal jugular approach.  A radial arterial line  is placed.  Intravenous antibiotics are administered.  Following induction  with general endotracheal anesthesia, a Foley catheter is placed.   The patient's chest, abdomen, both groins and both lower extremities are  prepared and draped in a sterile manner.  A median sternotomy incision is  performed, and the left internal mammary artery is dissected from the chest  wall and prepared for bypass grafting.  The left internal mammary artery is  small caliber but has excellent forward flow.  Simultaneously, a saphenous  vein is obtained from the patient's left thigh using endoscopic vein harvest  technique.  Initially, an incision is made the right thigh, and the  saphenous vein is exposed.  However, the vein in the right thigh is too  small and unsatisfactory to be utilized for a conduit.  Therefore, vein is  obtained from the left thigh, as described.  The patient is heparinized  systemically.   The pericardium is opened.  The ascending aorta is normal in appearance.  The ascending aorta and the right atrium are cannulated for cardiopulmonary  bypass.  Adequate heparinization is verified.  Cardiopulmonary bypass is  begun, and the surface of the heart is inspected.  Distal sites are selected  for coronary artery bypass grafting.  Portions of saphenous vein and the  left internal mammary artery are trimmed to appropriate lengths.  A  temperature probe is placed in the left ventricular septum.  A cardioplegia  catheter is placed in the ascending aorta.   The patient is cooled to 32-degrees systemic temperature.  The aortic cross-  clamp is applied, and cardioplegia is delivered in the antegrade fashion  through the aortic  root. Iced saline flush is applied for topical  hypothermia.  The initial cardioplegic arrest and myocardial cooling are  felt to be excellent.  Repeat doses of cardioplegia are administered  intermittently throughout the cross-clamp portion of the operation, both  through the aortic root and down subsequently placed vein grafts to maintain  septal temperature below 15-degrees centigrade.  The following distal  coronary anastomoses are performed:  1) The first circumflex marginal branch  is grafted with a saphenous vein graft in a end-to-side fashion.  This  coronary measured 1.5 mm in diameter and is of good quality; 2) The first  diagonal branch off the left anterior descending coronary artery is grafted  with a saphenous vein graft in a end-to-side fashion.  This coronary  measures 1.5 mm in diameter and is of good quality; 3) The distal left  anterior descending coronary artery is grafted with the left internal  mammary artery in a end-to-side fashion.  This coronary measures 1.2 mm at  the site of distal bypass and is of fair quality.  Both proximal saphenous  vein anastomoses are performed directly to the ascending aorta, prior to  removal of the aorta cross-clamp.  The septal temperature is noted to rise  rapidly and dramatically upon reperfusion of the left internal mammary  artery.  The aortic cross-clamp is removed, after a total cross-clamp time  of 55-minutes.   The heart begins to beat spontaneously without need for cardioversion.  Normal sinus rhythm resumes spontaneously.  All proximal and distal  anastomoses are inspected for hemostasis and appropriate graft orientation.  The patient is rewarmed to 37-degrees centigrade temperature.  The patient  is weaned from cardiopulmonary bypass without difficulty.  The patient's  rhythm at separation from bypass is a normal sinus rhythm.  No inotropic support is required.  Total cardiopulmonary bypass time for the operation is   70-minutes.   The mediastinum and the left chest are irrigated with saline solution  containing Vancomycin.  The venous and arterial canulae are removed  uneventful.  Meticulous surgical hemostasis is ascertained.  The patient is  transfused one ten pack of adult platelets and two units of fresh frozen  plasma due to coagulopathy.  The patient also received two units packed red  blood cells during cardiopulmonary bypass due to anemia, which was present  preoperatively and exacerbated by dilution with cardiopulmonary bypass  circuit.   The mediastinum and both left and right pleural spaces are drained with four  chest tubes placed through separate stab incisions inferiorly.  The median  sternotomy is closed in routine  fashion.  The lower extremity incisions are  closed in multiple layers in routine fashion.  All skin incisions are closed  with subcuticular skin closures.   The patient tolerated the procedure well and is transported to the surgical  intensive care unit in stable condition.  There are no intraoperative  complications.  All sponge, instrument and needle counts are verified  correct at completion of the operation. No blood products were administered.                                               Salvatore Decent. Cornelius Moras, M.D.    CHO/MEDQ  D:  09/28/2002  T:  09/29/2002  Job:  [id number]   cc:   Gerlene Burdock A. Alanda Amass, M.D.  [phone] [address]  [place]  [place] [address]  Fax: [phone]   Colon Flattery, MD  [address]  [place]  [place] [address]  Fax: [phone]

## 2010-10-02 NOTE — Assessment & Plan Note (Signed)
Date of birth:  03/14/1941.   Evelyn [name] is a 70 year old married female who is being seen in our [facility] for cervicalgia, upper extremity pain  intermittently.   She was last seen by me on June 15, 2006.   She is back in today for a refill of her medications.   She states that she had a fall approximately 2 weeks ago. She got up to  answer the phone. She caught her foot on part of a rug and went down.  She saw her Cardiologist soon after that and was taken off of some of  her blood pressure medications.   She is back in today. Her average pain is about a 6 on a scale of 10.  Currently, in the office this is zero on a scale of 10. She has some  intrascapular pain and some mild lumbago on occasion, and intermittent  right and left upper extremity pain. However, today she has no pain at  this time. When she does get her pain, she states it is aching and  burning in nature. It really does not interfere with any of her  activities. She sleeps fairly well. She gets good relief with her  medications.   She lists this clinic prescribes the following medications for her:  Neurontin 300 mg up to 3 times a day and Vicodin 5/500 1 to 2 times a  day.   She is able to walk about 15 minutes at a time. She is independent with  her self-care. She denies constipation or over sedation. She reports she  does have some intermittent abdominal pain. I asked her to follow up  with her primary care or gynecologist.   No other changes in her past medical or social or family history are  appreciated. She does have a note from Dr.  Alanda Amass, her Cardiologist,  which is attached to the chart.   No changes in her social or family history since our last visit.   Exam:  Blood pressure is 118/64, pulse 67, respirations 16, 97%  saturations on room air. She is a thin adult female who does not appear  in any distress. She appears slightly younger than her stated age.   She is  oriented times three. Her affect is bright, alert. She is  cooperative and pleasant.   She transitions from sit to stand easily. Her gait in room is normal.  She has just a little bit of trouble with tandem gait. She states that  her left leg is a little bit sore from her fall.   A Romberg test is performed adequately.   Cervical lumbar range of motion is performed adequately without  increased pain.   Seated reflexes are symmetric and intact in lower extremities. Motor  strength is good throughout both lower extremities. Internal and  external rotation of her hips does not increase her pain. Vibratory  sensation is intact and position sense is intact as well in both lower  extremities.   IMPRESSION:  1. Cervical spondylosis.  2. Mild left upper extremity radiculopathy which is variable      throughout the month. Currently, not a problem at this point.   She states that she thinks the Neurontin is helping. She takes it only  twice a day, however. She takes Vicodin 1 to 2 times days as well on a  p.r.n. basis. She denies any problems with sedation or constipation. She  has not displayed any aberrant behavior and takes  her medications as  prescribed. We will see her back  in a month.           ______________________________  Evelyn [name], M.D.     DMK/MedQ  D:  08/15/2006 10:38:43  T:  08/15/2006 11:19:30  Job #:  [id number]   cc:   Hilda Lias, M.D.  Fax: [phone]

## 2010-10-02 NOTE — Assessment & Plan Note (Signed)
[name] is a pleasant 70 year old female who is seen in our [facility] for left upper extremity pain, intrascapular pain.   She was last seen by me on February 21, 2006.  She was referred by Dr. Jeral Fruit.   She states her pain can go up to an 8 on a scale of 10 in the left upper  extremity and the intrascapular region; however, her pain lasts really only  about 1 hour a day when she uses about 1 Vicodin each day.  Pain is  described as dull, burning and aching in nature.  Her pain does not  interfere at all with her general activity, relationship with others or  enjoyment of life.   She reports good sleep.   She gets good relief with the current medications that she is on.   She can walk about 20 minutes at a time.  She is able to climb stairs.  She  is independent with all of her self care.  She is currently retired.   Health and history form are reviewed.  She denies any new problems regarding  her past medical, social or family history.  She continues to live with her  husband.  She is a nonsmoker.   EXAMINATION:  Blood pressure 117/60, pulse 58, respirations 16, 96%  saturated on room air.  She is a thin adult female who appears her stated age.  She is oriented x3.  Her affect is bright, alert.  She is cooperative and  pleasant.  Her speech is clear.  She follows commands without any  difficulty. She does not appear in any distress.   She transitions from sit to stand easily.  Her balance is good. Her gait is  symmetric and nonantalgic.  Romberg's test and tandem gait are well within  normal limits.   She has minimal decreased range of motion with rotation both right and left  regarding her cervical spine.  She has fairly good flexion and extension.  She has full shoulder range of motion bilaterally.   Her reflexes are symmetric and intact in both upper and lower extremities  and sensory exam to light touch in the upper extremities is within normal  limits and she has good motor strength throughout both upper and lower  extremities.  No focal weakness is appreciated.   IMPRESSION:  1. Cervical spondylosis.  2. Mild left upper extremity radiculopathy, variable in intensity      throughout the month.   PLAN:  Will increase Neurontin from 100 mg nightly to 300 mg nightly for 3  days, then b.i.d. for 3 days and then t.i.d.  She also continues to take  Mobic on a p.r.n. basis and we will write her a prescription for Vicodin  5/500 1 p.o. q. day to b.i.d., #45, no refills.  I will see her back in a  month.  She will let us know how she tolerates the Neurontin.  In the past  she has had some trouble with some gastric intolerance when she took it and  whether or not we can attribute it to the Neurontin, it is not entirely  clear at this time.  She has been stable on these medications.  She  displays no aberrant behavior and she gets good relief with the medications  and is able to maintain a very functional lifestyle.           ______________________________  Brantley Stage, M.D.     DMK/MedQ  D:  03/21/2006 13:57:07  T:  03/22/2006 06:09:58  Job #:  [id number]

## 2010-10-02 NOTE — Consult Note (Signed)
NAME:  Evelyn [name], Evelyn [name]                        ACCOUNT NO.:  [id number]   MEDICAL RECORD NO.:  [id number]                   PATIENT TYPE:  INP   LOCATION:  2005                                 FACILITY:  [facility]   PHYSICIAN:  Iva Boop, M.D. [facility]           DATE OF BIRTH:  10/27/1940   DATE OF CONSULTATION:  01/19/2002  DATE OF DISCHARGE:  01/20/2002                                   CONSULTATION   CHIEF COMPLAINT:  I am asked to see this patient regarding non-cardiac chest  pain.   HISTORY:  This is a 70 year old woman with known coronary artery disease  that has had four catheterizations in the last couple of months.  She has  had a stent placed at the proximal LAD in July of 2002, and then in July of  2003, had some dull chest pressure, had restenosis in the stent, had cutting  balloon PTCA.  Recurrent chest pain in early August and had 80% stenosis in  the mid distal diagonal, and then had two additional stents placed.  Then  she did okay, but the pain began to return and she was taken to  catheterization today and it was normal.  She said that the pain went away  yesterday with the administration of Nitroglycerin.  She was describing dull  chest pressure with neck pain and pressure with this not related to movement  or eating and she denies any pyrosis, classic heartburn, reflux or dysphagia  or hoarseness.  She sleeps well.  She does not have anxiety issues.  She  does not have significant musculoskeletal illness.  She has never had an  endoscopy.  She was placed on Nexium 40 mg a day a number of months ago by  Dr. Alanda Amass, though she does not have any particular heartburn symptoms.  She did not notice any change in the way she felt on that medication.  Should note that she had intravascular ultrasound of the LAD and there is no  restenosis in this area.   DRUG ALLERGIES:  SULFA.   MEDICATIONS:  Aspirin 325 mg q.d., Plavix 75 mg q.d., Nexium 40 mg q.d.,  Premarin 0.625  mg q.d., Zocor, Altace, Toprol XL.   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  She lives in [place] with her husband.  No tobacco or alcohol.   FAMILY HISTORY:  Leukemia in her mother.  No clear cut GI disease.   PAST MEDICAL HISTORY:  Coronary artery disease, dyslipidemia, osteoporosis,  mitral valve prolapse, hysterectomy, tubal ligation, tonsillectomy, foot  surgery.   REVIEW OF SYSTEMS:  Notable for things mentioned above.  She has some back  and left hip and lower extremity pain at times.  All other systems appear  negative at this time.   PHYSICAL EXAMINATION:  GENERAL:  A pleasant, well-developed, well-nourished,  white woman in no acute distress.  VITAL SIGNS:  Temperature 97.3, blood pressure 93/51, pulse 53, respirations  20.  HEENT:  Eyes are anicteric.  Mouth free of lesions.  NECK:  Supple.  No mass or thyromegaly.  CHEST:  Chest wall nontender.  Clear.  HEART:  S1, S2.  No rubs or gallops.  ABDOMEN:  Soft, nontender without organomegaly or mass.  EXTREMITIES:  Free of edema.  SKIN:  Free of rash.  MENTAL STATUS:  She is alert and oriented times 3.  PSYCHE:  Good affect.  Not anxious.  I note that there is a pressure dressing in the right groin.  RECTAL:  Not done due to her recent catheterization today.   LABORATORY DATA:  Hemoglobin 11.8, hematocrit 33.8%, white count 5.5,  platelets 224, lipase normal, amylase normal, coag's normal on admission,  LFT is normal, BMP normal.  EKG was without any ischemia.  Showed sinus  bradycardia.   ASSESSMENT:  Atypical chest pain.  Etiology not apparent.  I do not think it  is related to reflux disease and it is extremely unlikely for her to have  any significant gastrointestinal pathology related to reflux or causing  chest pain while she is on Nexium.  It is clear she had some angina because  she responded to that in her cardiac catheterizations and angioplasties and  with stents  before.  I do not know what caused her pain now, but it has  resolved and I do not think the yield of endoscopy is worthwhile at this  point.  In fact, I think it would be reasonable to discontinue her Nexium to  see how she does.  It is not clear to me that medicine is providing  significant benefit.  If her symptoms came back and she was placed back on a  protime pump inhibitor I think that would be fairly conclusive for  gastroesophageal reflux disease, though in my estimation I think that is  pretty unlikely.   I would be happy to see her as an outpatient if there are persistent  problems.   I appreciate the opportunity to care for this patient.                                                Iva Boop, M.D. [facility]    CEG/MEDQ  D:  01/19/2002  T:  01/22/2002  Job:  [id number]   cc:   Madaline Savage, M.D.  [address], [address]  [place]  [place] [address]  Fax: [phone]

## 2010-10-02 NOTE — Cardiovascular Report (Signed)
[place]. [facility]  Patient:    Evelyn [name], Evelyn [name] Visit Number: [id number] MRN: [id number]          Service Type: CAT Location: [id number] Attending Physician:  Ophelia Shoulder Dictated by:   Madaline Savage, M.D. Proc. Date: 11/29/01 Admit Date:  11/29/2001 Discharge Date: 11/30/2001   CC:         Richard A. Alanda Amass, M.D.  Cardiac Catheterization Laboratory   Cardiac Catheterization  PROCEDURES PERFORMED: 1. Selective coronary angiography by Judkins technique. 2. Retrograde left heart catheterization. 3. Left ventricular angiography. 4. Abdominal aortography. 5. Percutaneous Cutting Balloon angioplasty for in-stent re-stenosis of the    mid left anterior descending coronary artery.  COMPLICATIONS: None.  ENTRY SITE: Right femoral.  DYE USED: Omnipaque.  PATIENT PROFILE: The patient is a 70 year old woman, who has had recent chest pain very similar to discomfort she had a year ago around the time when she was noted to have a lesion in her mid LAD. At that time, she underwent stenting successfully.  The patient enters the catheterization laboratory electively at this time.  RESULTS:  PRESSURES: The left ventricular pressure was 120/11, central aortic pressure was 120/60, mean 85.  No aortic valve gradient by pullback technique.  ANGIOGRAPHIC RESULTS: The left main coronary artery was normal.  The left anterior descending coronary artery coursed to the cardiac apex. It gave rise to one diagonal branch, which was actually larger than the LAD itself. No lesions were seen in the diagonal. The LAD contains a radiopaque stent just after the first septal perforator branch. That stent appears to be without significant re-stenosis in the distal half of the stent. The proximal half of the stent contains 90% re-stenosis. The re-stenosis proximally appears to extend a little bit more proximal in the LAD than the stent edge.  The  right coronary artery is a dominant vessel containing no lesions.  Left ventricle is normal. Abdominal aortography shows the aorta to be smooth with no lesions noted. Both coronary arteries are widely patent.  Both iliacs appear normal.  The percutaneous intervention was done by the right percutaneous femoral approach. The patient was systemically heparinized. The patient had an ACT of approximately 360. Integrilin was used. The guide catheter was a 6 [place] left Judkins 4 guide. The guide wire was a Radiographer, therapeutic. The first balloon used was a 2.5 x 15 mm Cutting Balloon inflated to 9 atmospheres on one occasion and then removed. I then used a 2.5 x 20 mm Maverick balloon dilating inside the stent, but also extending it well beyond the proximal edge of the stent and also well beyond the distal end of the stent. Inflation pressures were approximately 6 atmospheres on that Maverick balloon. After completion of this, the patient appeared to have smooth edges both proximally and distally and the re-stenosis was reduced to basically 0% residual. TIMI-3 class distal flow was preserved.  FINAL DIAGNOSES: 1. In-stent re-stenosis of the mid left anterior descending stent in the    proximal half of the stent. 2. Successful Cutting Balloon angioplasty of in-stent re-stenosis. 3. Single-vessel disease of the left anterior descending. 4. Normal left ventricular systolic function.  PLAN: The patient will be given Plavix and Integrilin for the next 18 hours. Dictated by:   Madaline Savage, M.D. Attending Physician:  Ophelia Shoulder DD:  11/29/01 TD:  12/04/01 Job: [id number]

## 2010-10-02 NOTE — Cardiovascular Report (Signed)
NAMEMarland Kitchen  HAJRA, PORT                        ACCOUNT NO.:  [id number]   MEDICAL RECORD NO.:  [id number]                   PATIENT TYPE:  OIB   LOCATION:  6523                                 FACILITY:  [facility]   PHYSICIAN:  Richard A. Alanda Amass, M.D.          DATE OF BIRTH:  1940-08-12   DATE OF PROCEDURE:  09/26/2002  DATE OF DISCHARGE:                              CARDIAC CATHETERIZATION   PROCEDURE:  1. Retrograde central aortic catheterization.  2. Selective coronary angiography, pre and post intracoronary nitroglycerin     administration.  3. Left ventricular angiogram, right anterior oblique/left anterior oblique     projection, subselective left internal mammary artery.  4. Attempted cutting balloon angioplasty and stent, ostial-proximal left     anterior descending.  5. Side-branch diagonal percutaneous transluminal coronary angioplasty.  6. Aggrastat bolus plus infusion; weight-adjusted heparin.   PROCEDURE:  The patient was brought to the second floor CP lab in a  postabsorptive state after 5 mg Valium p.o. premedication.  The right groin  was prepped, draped in the usual manner.  The CRFA was entered through a  single anterior puncture using an 18 thin-walled needle and using modified  Seldinger technique, a 6-French short Diag side-arm sheath was inserted  without difficulty.  Diagnostic coronary angiography was done with 6-French  4-cm tapered preformed coronary Cordis and pigtail catheters.  IC  nitroglycerin 0.4 was given into the left coronary with repeat injections  obtained.  Subselective LIMA was done with the right coronary catheter.  LV  angiogram was done with the pigtail catheter, RAO and LAO, at 25 mL, 14 mL  per second in RAO and hand injections in the LAO projection.  There was no  gradient across the aortic valve on catheter pullback.  Catheter was  removed, sidearm sheath was flushed, pending review of the patient's  cineangiograms.  She tolerated the  diagnostic procedure well.   PRESSURES:  LV:  160/0; LVEDP 16 mmHg.   CA:  160/85 mmHg.   No gradient across the aortic valve.   LEFT VENTRICULAR ANGIOGRAM:  Left ventricular angiogram demonstrated a  normally contracting LV in the LAO projection and mild anterolateral wall  hypokinesis in the RAO projection with EF approximately 55% and no mitral  regurgitation.  Mitral valve prolapse was present angiographically with no  mitral regurgitation.   The main left coronary artery tapered down to about 30-40% narrowing in the  distal third with good residual large lumen and was smooth.   The left anterior descending artery appeared to have ostial stenosis of  approximately 80-85% on using multiple angulated projections.  There was  segmental proximal, much more mild LAD disease of 30-40% up to the large  twin diagonal.  The large twin diagonal had 30% to 40% ostial narrowing,  bifurcated and was as large as the LAD.   Beyond the twin diagonal, there were previously-placed tandem coronary  stents.  There was 30%  narrowing in the proximal third, 20-30% in the  midportion and the distal third of the stents showed 70% or greater  concentric narrowing.  The LAD beyond this was slightly tortuous, of  moderate size; there was mild disease beyond the stents of about 40% but no  significant disease from the mid-distal section down.  The LAD bifurcated at  the apex.   The circumflex artery had about 30% ostial narrowing.  There was suggestion  of this on multiple projections and not seen on several others.   There were two small normal marginals proximally, a normal third marginal  from the midportion; the distal circumflex bifurcated with two large  branches and with no significant disease and smooth; the PAVG branch was  normal.   The dominant right coronary artery was widely patent, smooth and normal  throughout its course with a normal PDA and PLA.  There are no collaterals  to the  LAD.   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  She had  catheterization in 1999 with noncritical disease for chest pain.   She then developed fairly typical symptoms of angina and then underwent LAD  stenting for greater than 75% lesion on December 21, 2000 by Dr. Madaline Savage with a 2.5/18 Hepacoat stent.  She developed recurrent chest pain and  developed in-stent restenosis (ISR) and this was treated with a 2.5 cutting  balloon on November 17, 2001 by Dr. Elsie Lincoln.  She was restudied, August 2003, by  Dr. Darlin Priestly and had progression of disease and IVUS interrogation  was again done and she had two overlapping 2.25/12 and 2.75/12 Express  stents placed in the LAD.  These stents were beyond the large twin diagonal.  In September 2003, IVUS interrogation of the stents did not show high-grade  in-stent restenosis.   She did develop recurrent symptoms, however, and on May 19, 2002, was  found to have high-grade 85%, 85% and 90% triple tandem proximal in-stent  restenoses.  This was treated with cutting balloon atherectomy with a 2.5  under IVUS guidance, high-pressure inflations, and she subsequently had  brachytherapy for ISR with a Galileo P-32 Beta long system of 52 cm x 3.0.  She had a good result angiographically.  Unfortunately, she has had chest  pain almost since discharge from the hospital but it has been mild.  Over  the last month, she has had recurrent episodes of exertional chest pain when  she walks compatible with angina and no rest pain.  She has been on aspirin  and Plavix.   The Plavix was stopped in preparation for this catheterization for  evaluation, in case she required consideration for CABG.  The patient essentially has in-stent restenosis of the distal third of her  LAD stent, post brachytherapy,  and ostial LAD disease.  She is very adamant  about trying to treat this percutaneously if possible.  I have discussed the  risks, the benefits and the possibilities with the patient.  I feel she has  ostial and proximal LAD disease; some of this may have been missed on prior  catheterizations.  We felt that we could consider possible ostial proximal  LAD stenting, since we can see the lesion in angulated caudal views.  We  elected to proceed with IVUS interrogation to further evaluate this.   INTERVENTIONAL PROCEDURE:  The  patient was given 3000 units of heparin, ACT  was 276 and the LAD was intubated with a JR4 6-French guiding catheter.  The  LAD stents were crossed with an HDS 0.014-inch guidewire.  IVUS  interrogation was done with an Atlantis Pro 2.5-French 40 MHz IVUS.   This pullback was done beyond the in-stent restenosis in the distal third.   There was actually a moderate residual lumen of 2.2 x 2.3 in the distal in-  stent restenosis, although it was less than 4 mm in cross-sectional area.   The ostial proximal LAD, however, had high-grade eccentric stenosis of 1.5 x  1.8 diameter (2.2 sq mm CSA).   We started Aggrastat bolus plus infusion and we elected to try intervention  in this setting.  A 3.0/6 cutting balloon was passed across the ostial LAD  stenosis and carefully dilated at 6-35.  There was node dissection and  residual narrowing.  We then exchanged this for a 3.0/8 short TAXUS stent.  This one actually required overlap of the previous stent.  We tried to  precisely place this at the ostium of the LAD; it was deployed under  fluoroscopic control; it was deployed at 10-40.  Injections following this  showed good flow, however, there was residual ostial stenosis that was not  covered by the stent, approximately 1 to 2 mm, that was high-grade.  The  side-branch DX-1 had pinching, so this was crossed with the guidewire and  dilated with a 2.75/15 Maverick balloon at  7-43 and 8-30 with a good result  and stenosis reduction to less than 20% and good flow.  I then tried to  exchange this for the IVUS, however, the IVUS would not pass easily, so we  did not want to pursue this any further across the ostial LAD stent.  Final  injection showed some mild disruption at the LAD-circumflex bifurcation, no  thrombus formation and good flow to both branches.   I would consider this a failed intervention because of failure to cover the  angled LAD ostia.  The patient is quite stable and will be kept on IIb/IIIa  inhibitors.  Cardiovascular surgical consultation will be obtained.  I  believe the patient will require left coronary CABG because of high-grade  residual ostial stenosis of the LAD, probably progressed and not recognized  at earlier procedures, along with in-stent restenosis of the mid LAD stent. She is a candidate for LAD, possible large, probable twin diagonal and  circumflex bypass in this setting.   CATHETERIZATION DIAGNOSES:  1. Arteriosclerotic heart disease, recurrent angina.  2. Multiple prior procedures as outlined above with tandem proximal-mid left     anterior descending stenting, subsequent cutting balloon atherectomy and     restenting and subsequent brachytherapy (see above) with recurrent angina     since last procedure of January 2004.  3. Suboptimal result, attempted ostial-proximal left anterior descending     stent with residual ostial left anterior descending stenosis.  4. Well-preserved left ventricular function, ejection fraction approximately     55%.  5. Mitral valve prolapse angiographically without mitral regurgitation.  6. Hyperlipidemia.  7. Systemic hypertension.  8. Gastroesophageal reflux disease.  9. Remote lumpectomy, no recurrence.                                               Richard A. Alanda Amass, M.D.  RAW/MEDQ  D:  09/26/2002  T:  09/27/2002  Job:  [id number]   cc:   [facility]   Gwenith Daily.  Tyrone Sage, M.D.  [address]  [place]  [place] [address]  Fax: [phone]   Colon Flattery, MD  [address]  [place]  [place] [address]  Fax: [phone]   Nicki Guadalajara, M.D.  [phone] [address], [address]  [place], [place] [address]  Fax: [phone]

## 2010-10-02 NOTE — Group Therapy Note (Signed)
HISTORY OF PRESENT ILLNESS:  Evelyn [name] is a pleasant 70 year old married  female who is being seen in our pain and rehabilitative clinic for left  upper extremity pain and intrascapular pain.   She was kindly referred by Dr. Jeral Fruit for chronic pain management.   Apparently, she has been followed by Dr. Jeral Fruit who at one point had  considered surgery on her cervical spine. However, Evelyn [name] apparently has  gotten somewhat better and is not ready for a surgical option at this point.  She has had some electrodiagnostic studies May 11, 2005 which were  normal for evaluation for carpal tunnel or neuropathy. No needle EMG was  performed, however.   Also had cervical MRI which was positive for C5-6 osteophyte complex which  effaced to the ventral thecal sac and contents of the cord. There was some  biforaminal narrowing. Right foraminal narrowing at C3-4 and left greater  than right foraminal narrowing at C6-7 with reversal of the normal lordosis.   Evelyn [name] states that although she does have some pain in the left upper  extremity it does not stop her from engaging in her activities of daily  living. She describes the pain as fairly constant and average of a 6 on a  scale of 10. Pain is localized to an area below the elbow into her hand,  quite often involving the thumb region.   It does not really interfere much with her relations with others or general  activity, between a little and moderately in her enjoyment of life. Pain is  typically worse in the morning. Her sleep is fair, however. She gets good  relief taking two hydrocodone per day.   She is able to walk about 20 minutes at a time. She is able to climb stairs.  She drives. She is independent with her self-care as well as higher level  household activities. She is a retired Economist.   She denies problems controlling bowel or bladder. Denies problems  controlling bowel and bladder. Denies depression or  anxiety. Denies suicidal  ideation.   Health and history form are reviewed, and review of systems is evaluated.   PAST MEDICAL HISTORY:  Positive for coronary artery disease. Apparently, she  had been stented and eventually underwent triple bypass procedure in May of  2003. She also has a history of thyroid problems. Other surgeries including  tonsillectomy, tubal ligation, foot surgery, hysterectomy.   She also has a history of osteoporosis. She is followed by Dr. Alanda Amass for  her cardiac problems.   She is married. Denies legal substance use. Denies alcohol use. Denies  smoking.   FAMILY HISTORY:  Positive for heart disease.   PHYSICAL EXAMINATION:  VITAL SIGNS:  Blood pressure 136/76, pulse 71,  respirations 17, 97% saturated on room air.  GENERAL:  She is a well-developed, well-nourished female who appears her  stated age. She is oriented x3. Her affect is bright, alert. She is  cooperative and pleasant. She does not appear in any distress.   She transitions from sit to stand without difficulty. Her gait in the room  is normal. She has a normal Romberg's test and normal tandem gait. She has  some limitations in range of motion in her cervical range, especially with  rotation to the right. She reports some discomfort. Her reflexes are  symmetric and intact in the upper and lower extremities. No focal weakness  is appreciated today. I understand she may have had some wrist extensor  weakness. I do not appreciate on exam today. She does state that during  muscle testing that it is a little harder for her to keep it up. She has to  work hard to keep her strength as I tested.   Pinprick reveals decreased sensation along the left deltoid region but is  intact throughout the lower arm and hand to pinprick.   IMPRESSION:  1. Spondylosis of the cervical spine.  2. Involvement at C3-4, C5-6, C6-7 with some foraminal narrowing and facet      involvement as well.  3. Mild left  upper extremity radiculopathy.   PLAN:  The patient states she has a two-month supply of hydrocodone at this  point and does not need refills on this. She would like to trial Lyrica 1  p.o. every night #30 with 2 refills. If she does well on this, may increase  it to twice a day and see if we can improve the pain in her left upper  extremity with this particular medication. Risks of benefits of this  medication were reviewed with her. She also uses Mobic on a p.r.n. basis up  to 3 times a week. I reviewed the use of this medication as well as risks  and benefits with her today. We will see her back in 6 weeks.           ______________________________  Brantley Stage, M.D.     DMK/MedQ  D:  01/07/2006 13:09:27  T:  01/07/2006 16:51:24  Job #:  [id number]

## 2010-10-02 NOTE — Discharge Summary (Signed)
[place]. [facility]  Patient:    Evelyn [name], Evelyn [name]                     MRN: [id number] Adm. Date:  [id number] Disc. Date: [id number] Attending:  Darlin Priestly Dictator:   Halford Decamp. Delanna Ahmadi, R.N., N.P. CC:         Colon Flattery, D.O.   Discharge Summary  HISTORY OF PRESENT ILLNESS:  [name] is a 70 year old white female patient of Richard A. Alanda Amass, M.D., who came to the office with complaints of shortness of breath, slight diaphoresis, chest tightness and discomfort left arm radiating to the left side of her chest and was told to go to the ER at [facility].  She had apparently had two episodes of pain at night awakening her.  She has a history of mild CAD with a catheterization in 1999 with a 30 to 40% LAD stenosis.  She was seen by Dr. Tresa Endo and admitted overnight to rule out for an MI and for outpatient Cardiolite.  Her CK-MBs apparently were negative.  Her blood pressure was well controlled on current medications.  We put her on a proton pump inhibitor, because in the past when she had had chest pain it was thought to be secondary to reflux and she was discharged home with outpatient follow-up.  LABORATORY DATA:  Her hemoglobin was 12.5, hematocrit 36.8, WBC 7.3, and platelet count 260.  Her sodium 143, potassium 3.7, BUN 10, creatinine 0.7. CK-MB was #1 47/1.3 with troponin less than 0.01, #2 also negative as well as #3.  There is no chest x-ray on the chart at the time of this dictation.  Her telemetry showed she was in normal sinus rhythm.  DISCHARGE DIAGNOSES: 1. Chest pain unknown etiology, no myocardial infarction ruled out with CK-MBs    and troponins. 2. Previous coronary artery disease, mild, in 1999 with cardiac    catheterization with 30 to 40% left anterior descending and 30% diagonal.    She also had a negative Cardiolite in the year 2000. 3. Gastroesophageal reflux disease. 4. Osteoporosis. 5. Mitral  valve prolapse.  DISCHARGE MEDICATIONS: 1. Protonix 40 mg once a day. 2. Fosamax 70 mg once a week. 3. Vioxx 25 mg once per day as needed. 4. Enteric-coated aspirin 325 mg once per day. 5. Toprol XL 50 mg once per day. 6. Premarin 0.625 mg once per day.  ACTIVITY:  No strenuous activity this week.  She should be on a low fat diet. She may return to work on Monday and she will have a Persantine Cardiolite on May 1, at 1:45.  She will follow up with myself on May 13, at 11 a.m.  I have changed her to a Persantine Cardiolite because her previous treadmill Cardiolite done was submaximal heart rate and it was stopped early for fatigue and shortness of breath. DD:  09/01/00 TD:  09/02/00 Job: [id number]

## 2010-10-02 NOTE — Discharge Summary (Signed)
[place]. [facility]  Patient:    Evelyn [name], Evelyn [name]                     MRN: [id number] Adm. Date:  [id number] Disc. Date: [id number] Attending:  Ruta Hinds Dictator:   Mancel Bale, P.A. CC:         Colon Flattery, D.O.   Discharge Summary  ADMISSION DIAGNOSES:  1. Unstable angina.  2. History of cardiac catheterization 1999 revealing nonobstructive     coronary artery disease.  3. Hyperlipidemia.  4. Hypertension.  5. Osteoporosis.  6. Mitral valve prolapse.  7. History of hysterectomy.  8. History of tubal ligation.  9. History of tonsillectomy. 10. History of foot surgeries.  DISCHARGE DIAGNOSES:  1. Unstable angina.  2. History of cardiac catheterization 1999 revealing nonobstructive coronary     artery disease.  3. Hyperlipidemia.  4. Hypertension.  5. Osteoporosis.  6. Mitral valve prolapse.  7. History of hysterectomy.  8. History of tubal ligation.  9. History of tonsillectomy. 10. History of foot surgeries. 11. Status post cardiac catheterization on December 21, 2000, with percutaneous     transluminal coronary angioplasty stents to the left anterior descending.  HISTORY OF PRESENT ILLNESS:  Evelyn [name] is a 70 year old white female with a history of noncritical CAD by catheterization January 09, 1998.  She was hospitalized overnight, August 31, 2000, and September 01, 2000, and was ruled out for MI and discharged home with a follow up cardiolite which was performed on Sep 14, 2000.  This revealed normal perfusion and EF 68%.  During that hospital she was placed on proton pump inhibitor prophylactically.  AT her follow up office visit on October 18, 2000, she continued to have intermittent chest pains. At that time no changes were made other than changing her proton pump inhibitor over to nexium.  However, it was felt that if she continued to have her current chest pain that she may require repeat cardiac catheterization and/or GI  evaluation.  On December 21, 2000, she presents to [facility] Emergency Room after going to Dr.. Meredith Leeds office, earlier that morning.  She reports that for the past week she has arteriosclerotic heart disease nausea, chest pain in her mid chest and constant left shoulder pain.  However, that morning the pain radiated to her left neck and her left jaw.  Over the last week she was experiencing the radiation of the pain, nausea, but no emesis and increased fatigue.  However, at the time of interview in the ER she was having no chest pain, no arm pain, no neck pain and was symptom free.  As well, she reported that she had had one episode of chest pain while sleeping.  On the previous Sunday night, while sleeping she was awoken with chest tightness and diaphoresis.  As well she had noted that her symptoms usually increased when at work.  She has used one spray of nitroglycerin on multiple occasions with complete relief of her pain and has only required 2 sprays of nitroglycerin on 2 occasions.  On exam at that time she was hemodynamically stable with a heart rate of 78, blood pressure 166/74, and examination revealed no significant findings.  EKG showed normal sinus rhythm without ST or T changes.  Her cardiac enzymes were pending.  At that time she was seen and evaluated by Dr. Lavonne Chick who felt that she was experiencing crescendo angina and planned for admission.  Will check  serial enzymes, rule out MI, and she was placed on aspirin, plavix, heparin, nitroglycerin and planned for cardiac catheterization later that day.  HOSPITAL COURSE:  On December 21, 2000, Evelyn [name] underwent cardiac catheterization by Dr. Lavonne Chick.  He proceeded with PTCA stent to the mid LAD which had a 75% stenosis which had a 0% residual.  During the procedure he used IV heparin and IV integrilin.  She tolerated the procedure well without complication.  There was no other significant CAD and she had normal LV  with ejection fraction 69% and there was no MR.  On December 22, 2000, Evelyn [name] is stable.  She has had no further chest pain or shortness of breath.  She is afebrile.  Her BP was 110 systolically.  Her rhythm has maintained sinus rhythm.  Her right groin is stable without hematomA or bleed.  At this time she is seen and evaluated by Dr. Susa Griffins who deems her stable for discharge home.  HOSPITAL CONSULTATIONS:  None.  HOSPITAL PROCEDURES: 1. Cardiac catheterization on December 21, 2000, by Dr. Lavonne Chick.  Please see his dictated catheterization report for further detail, as it is unavailable at this time.  He performed successful PTCA stents to the mid LAD which had a 75% stenosis and had residual 0% at the end of case.  She had no other significant coronary artery disease and the other vessels were patent.  There was no MR.  She had normal LV function with EF 69%.  IV heparin and IV aggrastat were used during the procedure.  She tolerated the procedure well without complication.  LABORATORY DATA:  Cardiac enzymes were negative with first set: CK 40, MBs less than 0.3.  Troponin 0.01.  Second set:  CK 40, MB less than 0.3, troponin 0.03.  On admission CBC shows WBC 6.1, hemoglobin 12.6, hematocrit 35.8, platelet 249.  At time of discharge on November 21, 2000, WBC 5.9, hemoglobin 11.1, hematocrit 31.5, and platelets 226.  She remained stable throughout the hospitalization.  On admission sodium 142, potassium 3.5, chloride 108, CO2 26, BUN 10, creatinine 0.6, glucose 90.  At time of discharge sodium 140, potassium 3.5, chloride 110, CO2 28, glucose 97, BUN 7, creatinine 0.5.  Also on admission coagulation showed protime 13.1, INR 1.0, PTT 32.  EKG shows normal sinus rhythm with nonspecific ST-T change.  DISCHARGE MEDICATIONS:  1. Stop taking Norvasc.  2. Start plavix 75 mg once a day for 1 month.  3. Enteric-coated aspirin 325 mg once a day.  4. Fosamax 70 mg every week.  5.  Vioxx 25 mg once a day.  6. Nexium 40 mg once a day.   7. Pravachol 20 mg once a day.  8. Toprol XL 50 mg once a day.  9. Premarin 0.625 mg once a day. 10. Nitroglycerin spray as directed for chest pain.  ACTIVITY:  No strenuous activity, lifting greater than 5 pounds, driving or sexual activity for 3 days.  DIET:  Low salt, low fat, low cholesterol diet.  WOUND CARE:  May gently wash her groin with water and soap.  FOLLOWUP:  Call the office at [phone] if you have any bleeding or increased size or pain in the groin.  She has an appointment to follow up with Dr. Alanda Amass in the [facility] on January 05, 2001, at 9:00 a.m. DD:  12/22/00 TD:  12/23/00 Job: [id number]

## 2010-10-02 NOTE — Discharge Summary (Signed)
NAMEMarland Kitchen  Evelyn [name], Evelyn [name]                        ACCOUNT NO.:  [id number]   MEDICAL RECORD NO.:  [id number]                   PATIENT TYPE:  OIB   LOCATION:  6525                                 FACILITY:  [facility]   PHYSICIAN:  Richard A. Alanda Amass, M.D.          DATE OF BIRTH:  [birth year]   DATE OF ADMISSION:  01/08/2002  DATE OF DISCHARGE:  01/09/2002                                 DISCHARGE SUMMARY   ADMISSION DIAGNOSES:  1. Unstable angina.  2. Coronary artery disease.     a. Status post percutaneous transluminal coronary angioplasty stent to        the mid left anterior descending on December 21, 2000.     b. Status post catheterization, November 19, 2001, that showed the left        anterior descending stent with restenosis.  At that time, cutting        balloon angioplasty was performed with an excellent result visually        with a step up and step down into the stented portion of the vessel.     c. Status post Cardiolite stress test, December 29, 2001, that showed        ejection fraction 65%.  No ischemia.  3. Hyperlipidemia.   DISCHARGE DIAGNOSES:  1. Unstable angina.  2. Coronary artery disease.     a. Status post percutaneous transluminal coronary angioplasty stent to        the mid left anterior descending on December 21, 2000.     b. Status post catheterization, November 19, 2001, that showed the left        anterior descending stent with restenosis.  At that time, cutting        balloon angioplasty was performed with an excellent result visually        with a step up and step down into the stented portion of the vessel.     c. Status post Cardiolite stress test, December 29, 2001, that showed        ejection fraction 65%.  No ischemia.  3. Hyperlipidemia.  4. Status post cardiac catheterization, January 08, 2002, by Dr. Darlin Priestly, with intravascular ultrasound of the left anterior descending     and subsequent percutaneous transluminal coronary angioplasty stent to  the distal left anterior descending, going from 80% to less than 10%, and     percutaneous transluminal coronary angioplasty stent to the proximal left     anterior descending, going from 80% to less than 10%.   HISTORY OF PRESENT ILLNESS:  The patient is a 70 year old white female who  is a patient of Dr. Pearletha Furl. Alanda Amass, but saw Dr. Madaline Savage in  the office on January 04, 2002 secondary to chest pain.  At that time, she  reported that since her cutting balloon angioplasty, which had been  performed November 29, 2001, that she had been  having chest discomfort almost  ever since the following day of that procedure that had been occurring daily  since then.  It was in the left central chest and she also had left arm pain  and neck pain with it and it was all relieved with nitroglycerin.  It was  reviewed that she had had a Cardiolite test on December 29, 2001 that showed  EF 65% and no ischemia.  As well, as the time of this office visit on January 04, 2002, there was no EKG change.  However, it was felt that she continued  to have ongoing chest pain consistent with angina ever since the last  intervention.  Given that she had history of restenosis, it was felt that  she needed to undergo cardiac cath.  Risks and benefits of the procedure  were discussed and she was agreeable to proceed.   HOSPITAL COURSE:  On January 08, 2002, the patient underwent cardiac  catheterization by Dr. Lenise Herald.  Please see the dictated report as  that is not available.  He performed IVUS to the mid LAD.  This showed the  vessel to be 2.3 x 2.4 and the lumen 1.5 x 1.0.  He then proceeded with IVUS  to the proximal vessel.  The vessel was 3.0 x 3.0 and the lumen 2.0 x 1.5.  He then proceeded with PTCA stents to the distal LAD and this went from 80%  to less than 10% residual; he used an Express II stent.  He then performed  PTCA stent to the proximal LAD and this went from 80% to less than 10%   residual and this as well was an Express II stent.  At the end of procedure,  he planned to stop Angiomax and planned for probable discharge home the  following morning if stable.  She tolerated the procedure well and had no  complication.   On January 09, 2002, the patient is stable with no complaints.  She is having  no further chest pain or shortness of breath.  She is afebrile at 97.8,  pulse 65, blood pressure 116/56.  Labs are stable post procedure with  creatinine 0.6.  She has maintained sinus rhythm and has had no arrhythmias.  Groin site is stable with no hematoma or bleed.  At this time, she is seen  and evaluated by Dr. Lennette Bihari, who deems her stable for discharge  home.   HOSPITAL CONSULTANTS:  None.   HOSPITAL PROCEDURES:  Cardiac catheterization on January 08, 2002 by Dr.  Lenise Herald.  His dictated report is not available to me, so see that for  details.  He performed intravascular ultrasound to the mid left anterior  descending; this vessel was 2.3 x 2.4, the lumen 1.5 x 1.0.  He also  performed intravascular ultrasound to the proximal left anterior descending;  the vessel was 3.0 x 3.0 and the lumen 2.0 x 1.5.  He then proceeded with  percutaneous transluminal coronary angioplasty stents to the left anterior  descending.  The distal vessel went from 80% to less than 10% residual.  He  used an Express II stent.  He proceeded with percutaneous transluminal  coronary angioplasty stent of the proximal left anterior descending and this  also went from 80% to less than 10% and he had also used an Express II.  She  tolerated the procedure well and had no complication.  He planned to stop  Angiomax and planned for discharge home the  following morning if stable.   LABORATORY AND ACCESSORY CLINICAL DATA:  Preprocedural labs were done as an  outpatient and stable.  Discharge labs on day of discharge showed sodium 139, potassium 3.5, BUN 11, creatinine 0.6, glucose 99;  white count 6.2,  hemoglobin 11.5, hematocrit 34.6 and platelets 214,000.   Telemetry has shown sinus rhythm with no arrhythmias.   EKG shows normal sinus rhythm, no ST-T change.   DISCHARGE MEDICATIONS:  1. Imdur 30 mg once a day.  2. Lipitor 20 mg once a day.  3. Nexium 40 mg once a day.  4. Premarin 0.625 mg once a day.  5. Toprol-XL 50 mg once a day.  6. Plavix 75 mg once a day.  7. Aspirin 81 mg a day.  8. Vioxx 12.5 once a day.  9. Nitroglycerin 0.4 mg sublingual as directed for chest pain.   ACTIVITY:  No strenuous activity, lifting greater than 5 pounds, driving or  sexual activity for three days.   DIET:  Low-salt, low-fat and low-cholesterol diet.   WOUND CARE:  May gently wash the groin site with warm water and soap.   SPECIAL DISCHARGE INSTRUCTIONS:  Call [phone] if any bleeding or increased  size or pain in groin site.   FOLLOWUP:  The office is to contact me with a followup appointment.  Their  computers were down, but she will follow up with Dr. Alanda Amass in the  [place] office.       Mary B. Easley, P.A.-C.                   Richard A. Alanda Amass, M.D.    MBE/MEDQ  D:  01/09/2002  T:  01/11/2002  Job:  [id number]   cc:   [place], [place] Colon Flattery, D.O.   Richard A. Alanda Amass, M.D.  [phone] [address]  [place]  [place] [address]  Fax: [phone]

## 2010-10-02 NOTE — Cardiovascular Report (Signed)
NAMEMarland Kitchen  Evelyn [name], Evelyn [name]                        ACCOUNT NO.:  [id number]   MEDICAL RECORD NO.:  [id number]                   PATIENT TYPE:  OIB   LOCATION:  6525                                 FACILITY:  [facility]   PHYSICIAN:  Darlin Priestly, M.D.             DATE OF BIRTH:  [birth year]   DATE OF PROCEDURE:  01/08/2002  DATE OF DISCHARGE:                              CARDIAC CATHETERIZATION   PROCEDURES:  1. Left heart catheterization.  2. Coronary angiography.  3. Left ventriculogram.  4. LAD - mid.     a. Intravascular ultrasound imaging.     b. Placement of intracoronary stent x2.   COMPLICATIONS:  None.   INDICATIONS:  The patient is a 70 year old female, patient of Dr. Susa Griffins with a history of CAD, status post PTCA and stenting of her LAD in  August of 2002.  The patient underwent repeat catheterization July 2003 by  Dr. Lavonne Chick revealing a diffuse in-stent re-stenosis of her LAD. She  underwent Cutting Balloon angioplasty without complications. Recently, she  has continued to complain of recurrent chest pain and underwent Cardiolite  scan on December 29, 2001, revealing no evidence of ischemia. The patient has  continued to complain of chest pain and is now referred back for repeat  catheterization.   DESCRIPTION OF PROCEDURE:  After given informed written consent, the patient  was brought to the cardiac catheterization lab where her right and left  groins were shaved, prepped, and draped in the usual sterile fashion.  ECG  monitoring was established.  Using modified Seldinger technique, a #6 French  arterial sheath was inserted in the right femoral artery.  A 6 French  diagnostic catheter was then used to perform diagnostic angiography. There  was a large left main with no significant disease.   The LAD is a medium sized vessel which coursed to the apex and gave rise to  one large diagonal branch.  The LAD is noted to have a stent in its early  midportion with 70-80% narrowing just proximal to the stent and 80%  narrowing just distal to the stent.  The distal mid and distal LAD is a  small vessel which coursed to the apex.  The first diagonal is a large  vessel which bifurcates in its distal segment and has no significant  disease. There is 40-50% narrowing in the proximal LAD.   The left circumflex is a medium sized vessel which coursed in the AV groove  and gave rise to one large obtuse marginal branch.  AV groove circumflex has  no significant disease.  The first OM is a large vessel which bifurcates in  the mid segment and has no significant disease.   The right coronary artery is a large vessel which is dominant and gives rise  to both the PDA as well as the posterolateral branch.  There is no  significant disease in the RCA, PDA  or posterolateral branch.   LEFT VENTRICULOGRAM:  Left ventriculogram reveals normal EF at 50-55%.  There is mild anterolateral hypokinesis.   HEMODYNAMICS:  Systemic arterial pressure 131/66, LV systemic pressure  130/12 LVEDP of 16.   INTERVENTIONAL PROCEDURE:  Following diagnostic angiography, a #6 [place]  guiding catheter was then coaxially engaged in the left coronary ostium.  Next, a 0.014 short Patriot guide wire was advanced out of the guiding  catheter and positioned in the distal LAD without complications.  Next, a  Scimed Ultra-Cross IVUS imaging catheter was then positioned in the mid LAD  and mechanical pullbacks were then performed.  The mid LAD, as a distal  reference, had a vessel size of approximately 2.3 x 2.4.  Pullback just  distal to the stent revealed a vessel of approximately 2.3 x 2.4 with a  lumen of 1 to 1.5 with near complete obliteration of the vessel around the  IVUS catheter. Pullback within the stent revealed minimal evidence of in-  stent re-stenosis with good stent apposition at approximately 2.75 to 3.0  vessel as it came more proximal. Just proximal to the  stent, there was again  diffuse disease with vessel of approximately 3.0 x 3.0 with a lumen of 1.5  to 2.0, and almost complete obliteration of the vessel lumen around the IVUS  catheter.  Pullback to the proximal LAD revealed mild 50% narrowing with a  4.2 cm squared area stenosis. The diagonal as well as the left main had no  significant disease.   Following IVUS imaging, a Express II 2.25 x 12 mm stent was then positioned  in the distal portion of the stent extending out into the LAD with careful  attention to overlap the distal segments.  This stent was then deployed to a  maximum of 16 atmospheres for a total of 1 minute and 3 seconds.  This  balloon was then pulled back across the overlapping segment and one  additional inflation was performed to a maximum of 16 atmospheres for a  total of 44 seconds. Followup angiogram revealed no evidence of dissection  or thrombosis with TIMI-3 flow to the distal vessel.  This stent balloon was  then removed and an Express II 2.75 x 12 mm stent was then positioned in the  proximal LAD with careful attention to overlap the distal portions of the  stent. This stent was then deployed to a maximum of 16 atmospheres for a  total of 1 minute and 3 seconds.  This balloon was then advanced over the  overlapping segment and one additional inflation to a maximum of 18  atmospheres was then performed for a total of 45 seconds.  We were careful  not to jail the diagonal branch. Followup angiogram revealed no evidence of  dissection or thrombosis with TIMI-3 flow to the distal vessel. This stent  balloon was removed and the IVUS imaging catheter was then positioned in the  mid LAD and mechanical block pullback was again performed. This revealed  well opposed stents to the vessel wall as well as the distal segments of the  previously placed stent appeared to have well opposed stent within the stent. There was no evidence of dissection or thrombosis.  IV  Angiomax was  used throughout the case.   Final orthogonal angiograms reveal less than 10% residual stenosis in the  mid LAD with no evidence of dissection or thrombosis.  At this point, we  elected to conclude the procedure.  All balloons, wires,  and catheters  removed.  Hemostatic sheaths were sewn in place.  The patient was returned  back to the ward in stable condition.   CONCLUSION:  1. Successful intravascular ultrasound imaging of the mid left anterior     descending with a placement of an Express II 2.25 x 12 and 2.75 x 12     stent in the distal and proximal edges of the previously placed stent     respectively.  2. Adjunct use of Angiomax.  3. Normal left ventricular systolic function with wall motion abnormalities     as noted above.                                                   Darlin Priestly, M.D.   RHM/MEDQ  D:  01/08/2002  T:  01/09/2002  Job:  [id number]   cc:   Gerlene Burdock A. Alanda Amass, M.D.

## 2010-10-02 NOTE — Assessment & Plan Note (Signed)
Evelyn [name] is a 70 year old married white female who has been seen in  our pain and rehabilitation clinic for predominantly cervicalgia and  intermittent left upper extremity pain, felt secondary to cervical  spondylosis intermittently, radicular type pain in nature.   She was last seen by me on August 15, 2006.   She is back in today for a refill of her hydrocodone.   She has been doing well on it.  She takes 1 to 2 a day.  She has also  has been using Neurontin twice a day to help her arm pain as well as her  neck pain.   She does use Lidoderm on a rare basis.  For the most part, she does not  feel that she needs any further medication or management for her  discomfort.   Her average pain is about a 7 on a scale of 10.  Currently in the clinic  it is a 0.   She describes her pain and dully and achy across the shoulder blades and  into the left arm throughout the upper arm, forearm and into the thumb.   She reports the arm pain occurs about 2 to 3 times a week and is about a  5 on a scale of 10 when it occurs.  It does not really interfere with  any activities that she is doing at the time.   She is able to walk about 15 minutes at a time.  She is able to drive  and she is a high functioning individual.  She is independent with all  of her self-care and higher level hospital duties.   Review of systems is positive for recent blood work by Dr. Alanda Amass, as  well as a recent bone density which she does not have the results for.   No other changes in past medical, social or family history.   Medications prescribed by this clinic include:  1. Neurontin 300 mg 1 p.o. at 8:00 a.m. and 4:00 p.m.  2. Vicodin 5/500 mg 1 p.o. up to 1 to 2 times a day, #60 each month.   On exam today, her blood pressure is 126/70, pulse 65, respirations 16,  97% saturated on room air.  She is a well-developed, well-nourished  female who appears her stated age.   She is oriented x 3.  She does not  appear in any distress.  Her affect  is bright and alert.  She is cooperative and pleasant.  Her speech is  clear.   She follows commands easily.  She transitions from sitting to standing  easily.  She wears a small high heel in the room.  Her balance is quite  good.  Tandem gait and Romberg tests are performed adequately.   She has just mild limitations with rotation left and right and it does  not bother her performing these activities in the room today.  She has  full shoulder range of motion.   Reflexes are symmetric and present in the upper and lower extremities.  No abnormal tone is noted.  Motor strength is in the 5 over 5 range in  the upper extremities.   No numbness is appreciated with light touch in the upper extremities as  well.   IMPRESSION:  1. Cervical spondylosis.  2. Mild intermittent upper extremity radiculopathy.  Currently not a      problem at this time.  Occurring 2 to 3 times a week and not      interfering with  activities.   PLAN:  Will refill her Vicodin 5/500 mg 1 p.o. q. day to b.i.d., #60, no  refills.  She has a couple more refills on her Neurontin.   She does not report any problems with over sedation or palpitations from  the current medications.  She does not display aberrant behavior and she  takes her medications as prescribed and is able to maintain a quite  functional lifestyle.   For flare ups I have recommended occasional Lidoderm and a soft collar,  which I wrote a prescription for, which she can use on a p.r.n. basis.  We will see her back in a month.           ______________________________  Brantley Stage, M.D.     DMK/MedQ  D:  09/14/2006 09:03:13  T:  09/14/2006 09:35:40  Job #:  [id number]

## 2010-10-02 NOTE — Assessment & Plan Note (Signed)
Evelyn [name] is a 70 year old female who has been seen in our pain and  rehab clinic for intrascapular pain and left upper extremity pain.  This  is felt to be secondary to neurogenic type pain from cervical  spondylosis.   She was last seen by me April 20, 2006.   She also has a history of cardiac disease.   She is back in today for refill of her medications.  She was last seen  by me April 20, 2006.   She states her average pain is about a 7 on a scale of 10.  It does not  interfere much with her activities of daily living or her enjoyment of  life. She reports good sleep at night.  She reports almost complete  relief with the meds that she is on.  The pain typically improves with  rest and medication.  She is not really sure what aggravates her pain in  general.  The patient is located across the scapular region and down the  left upper extremity.   She is able to walk 20 minutes at a time.  She is able to climb stairs  and drive.   She is a high-functioning individual.  Independent with all of her self  care.   REVIEW OF SYSTEMS:  The 14 points are negative today.   PAST MEDICAL HISTORY:  Unchanged from last visit.   SOCIAL AND FAMILY HISTORY:  Also unchanged.   EXAMINATION:  VITAL SIGNS:  Blood pressure 116/69, pulse 63,  respirations 16 and 99% saturated on room air.  GENERAL:  She is a well-developed, well-nourished female who appears her  stated age.  She does not appear in any distress.  She is smiling and  has a bright alert affect.  NEUROLOGIC:  She is oriented x3.  Speech is clear.  MUSCULOSKELETAL:  Transitions from sit to stand independently without  any pain at behaviors appreciated.  Her gait in the room is normal.  Balance and Romberg's test are  negative.  She has fairly good range of motion in her neck today.  Full  shoulder range of motion is also appreciated.  She does not complain of  any pain with these movements.  Reflexes are symmetric and intact  in the  upper extremities.  No numbness is appreciated with light touch.  Motor  strength was good in the upper extremities.  No abnormal tone is noted  in the upper and lower extremities. Lower extremity reflexes are within  normal limits as well.   IMPRESSION:  1. Cervical spondylosis.  2. Mild left upper extremity radiculopathy, which is variable in its      intensity throughout the month.   Will continue to keep her on the Neurontin.  She had tapered off of it  earlier this month.  Apparently, she had been on some antibiotics, which  made her feel ill.  She is currently getting back on the Neurontin.  She  does find that her pain is much better treated when she is on the  medication.  She will go back to 3 times a day over the next week.  She  also takes Vicodin 5/500, one p.o. daily to b.i.d. on an occasional  basis.   She is tolerating both medications.  Does not feel really sedated or  constipated.   She will need a refill on her Vicodin today 1 to 2 p.o. daily #60, 5/500  and she does not need a refill on her Neurontin  at this time.  I will  see her back in 2 months.           ______________________________  Brantley Stage, M.D.     DMK/MedQ  D:  06/15/2006 14:13:42  T:  06/15/2006 14:42:22  Job #:  [id number]

## 2011-02-05 LAB — BASIC METABOLIC PANEL
Chloride: 104
GFR calc non Af Amer: >60
Glucose, Bld: 89
Potassium: 4.1
Sodium: 141

## 2011-02-05 LAB — CBC
HCT: 39.8
Hemoglobin: 13.6
MCV: 94.9
RDW: 14.3

## 2011-12-01 HISTORY — PX: NM MYOCAR PERF WALL MOTION: HXRAD629

## 2011-12-07 ENCOUNTER — Other Ambulatory Visit: Payer: Self-pay | Admitting: Cardiovascular Disease

## 2011-12-13 ENCOUNTER — Other Ambulatory Visit: Payer: Self-pay | Admitting: Cardiovascular Disease

## 2011-12-15 ENCOUNTER — Ambulatory Visit (HOSPITAL_COMMUNITY)
Admission: RE | Admit: 2011-12-15 | Discharge: 2011-12-15 | Disposition: A | Discharge status: Home / Self Care | Payer: Medicare Other | Source: Ambulatory Visit | Attending: Cardiovascular Disease | Admitting: Cardiovascular Disease

## 2011-12-15 ENCOUNTER — Encounter (HOSPITAL_COMMUNITY): Payer: Self-pay | Admitting: Cardiology

## 2011-12-15 ENCOUNTER — Encounter (HOSPITAL_COMMUNITY)
Admission: RE | Disposition: A | Discharge status: Home / Self Care | Payer: Self-pay | Source: Ambulatory Visit | Attending: Cardiovascular Disease

## 2011-12-15 DIAGNOSIS — I200 Unstable angina: Secondary | ICD-10-CM | POA: Diagnosis present

## 2011-12-15 DIAGNOSIS — E785 Hyperlipidemia, unspecified: Secondary | ICD-10-CM | POA: Diagnosis present

## 2011-12-15 DIAGNOSIS — R931 Abnormal findings on diagnostic imaging of heart and coronary circulation: Secondary | ICD-10-CM | POA: Diagnosis present

## 2011-12-15 DIAGNOSIS — Z951 Presence of aortocoronary bypass graft: Secondary | ICD-10-CM | POA: Insufficient documentation

## 2011-12-15 DIAGNOSIS — I2510 Atherosclerotic heart disease of native coronary artery without angina pectoris: Secondary | ICD-10-CM | POA: Insufficient documentation

## 2011-12-15 DIAGNOSIS — E039 Hypothyroidism, unspecified: Secondary | ICD-10-CM | POA: Diagnosis present

## 2011-12-15 DIAGNOSIS — K219 Gastro-esophageal reflux disease without esophagitis: Secondary | ICD-10-CM | POA: Diagnosis present

## 2011-12-15 HISTORY — DX: Gastro-esophageal reflux disease without esophagitis: K21.9

## 2011-12-15 HISTORY — PX: LEFT HEART CATHETERIZATION WITH CORONARY ANGIOGRAM: SHX5451

## 2011-12-15 HISTORY — DX: Hypothyroidism, unspecified: E03.9

## 2011-12-15 HISTORY — DX: Hyperlipidemia, unspecified: E78.5

## 2011-12-15 HISTORY — DX: Atherosclerotic heart disease of native coronary artery without angina pectoris: I25.10

## 2011-12-15 LAB — BASIC METABOLIC PANEL
BUN: 13 mg/dL (ref 6–23)
CO2: 27 mEq/L (ref 19–32)
Chloride: 104 mEq/L (ref 96–112)
Glucose, Bld: 87 mg/dL (ref 70–99)
Potassium: 3.8 mEq/L (ref 3.5–5.1)
Sodium: 142 mEq/L (ref 135–145)

## 2011-12-15 LAB — CBC
HCT: 40.7 % (ref 36.0–46.0)
Hemoglobin: 13.6 g/dL (ref 12.0–15.0)
MCH: 31.7 pg (ref 26.0–34.0)
MCHC: 33.4 g/dL (ref 30.0–36.0)
MCV: 94.9 fL (ref 78.0–100.0)
RBC: 4.29 MIL/uL (ref 3.87–5.11)

## 2011-12-15 LAB — PROTIME-INR: Prothrombin Time: 13 seconds (ref 11.6–15.2)

## 2011-12-15 SURGERY — LEFT HEART CATHETERIZATION WITH CORONARY ANGIOGRAM
Anesthesia: LOCAL

## 2011-12-15 MED ORDER — SODIUM CHLORIDE 0.9 % IV SOLN
INTRAVENOUS | Status: DC
  Administered 2011-12-15: 1000 mL via INTRAVENOUS

## 2011-12-15 MED ORDER — LIDOCAINE HCL (PF) 1 % IJ SOLN
INTRAMUSCULAR | Status: AC
  Filled 2011-12-15: qty 30

## 2011-12-15 MED ORDER — ONDANSETRON HCL 4 MG/2ML IJ SOLN: 4.0000 mg | Freq: Four times a day (QID) | INTRAMUSCULAR | Status: DC | PRN

## 2011-12-15 MED ORDER — NITROGLYCERIN 0.2 MG/ML ON CALL CATH LAB
INTRAVENOUS | Status: AC
  Filled 2011-12-15: qty 1

## 2011-12-15 MED ORDER — MIDAZOLAM HCL 2 MG/2ML IJ SOLN
INTRAMUSCULAR | Status: AC
  Filled 2011-12-15: qty 2

## 2011-12-15 MED ORDER — ACETAMINOPHEN 325 MG PO TABS: 650.0000 mg | ORAL_TABLET | ORAL | Status: DC | PRN

## 2011-12-15 MED ORDER — SODIUM CHLORIDE 0.9 % IJ SOLN: 3.0000 mL | INTRAMUSCULAR | Status: DC | PRN

## 2011-12-15 MED ORDER — ASPIRIN 81 MG PO CHEW: 324.0000 mg | CHEWABLE_TABLET | ORAL | Status: DC

## 2011-12-15 MED ORDER — SODIUM CHLORIDE 0.9 % IV SOLN: 1.0000 mL/kg/h | INTRAVENOUS | Status: DC

## 2011-12-15 MED ORDER — FENTANYL CITRATE 0.05 MG/ML IJ SOLN
INTRAMUSCULAR | Status: AC
  Filled 2011-12-15: qty 2

## 2011-12-15 MED ORDER — OXYCODONE-ACETAMINOPHEN 5-325 MG PO TABS: 1.0000 | ORAL_TABLET | ORAL | Status: DC | PRN

## 2011-12-15 MED ORDER — HEPARIN (PORCINE) IN NACL 2-0.9 UNIT/ML-% IJ SOLN
INTRAMUSCULAR | Status: AC
  Filled 2011-12-15: qty 2000

## 2011-12-15 MED ORDER — SODIUM CHLORIDE 0.9 % IV SOLN: 250.0000 mL | INTRAVENOUS | Status: DC | PRN

## 2011-12-15 MED ORDER — DIAZEPAM 5 MG PO TABS: 5.0000 mg | ORAL_TABLET | ORAL | Status: DC

## 2011-12-15 MED ORDER — ASPIRIN 81 MG PO CHEW
324.0000 mg | CHEWABLE_TABLET | ORAL | Status: AC
  Administered 2011-12-15: 324 mg via ORAL
  Filled 2011-12-15: qty 4

## 2011-12-15 NOTE — Progress Notes (Signed)
Patient ID: Evelyn [name] MRN: [id number], DOB/AGE: Feb 11, 1941   Admit date: 12/15/2011   Primary Physician: No primary provider on file. Primary Cardiologist: Dr Alanda Amass  HPI: 71 y/o with a history of prior CABG as described in problem list. In May she had seen Dr Alanda Amass for chest pain. He suggested a Myoview but she had to reschedule it. It was recently done as an OP and was abnormal. She is admitted now for diagnostic cath. She has occasional chest pain, no NTG use.   Problem List: Past Medical History  Diagnosis Date  . Hypothyroid   . Dyslipidemia   . CAD (coronary artery disease)   . GERD (gastroesophageal reflux disease)     Past Surgical History  Procedure Date  . Back surgery 2009  . Coronary artery bypass graft 2004     Allergies:  Allergies  Allergen Reactions  . Sulfa Antibiotics Swelling     Home Medications Prescriptions prior to admission  Medication Sig Dispense Refill  . aspirin EC 81 MG tablet Take 81 mg by mouth daily.      [name] atorvastatin (LIPITOR) 40 MG tablet Take 60 mg by mouth daily.      [name] esomeprazole (NEXIUM) 40 MG capsule Take 40 mg by mouth daily before breakfast.      . HYDROcodone-acetaminophen (VICODIN) 5-500 MG per tablet Take 1 tablet by mouth every 12 (twelve) hours as needed. For pain      . HYDROCORTISONE EX Apply 1 application topically 2 (two) times daily as needed. For skin irritation      . levothyroxine (SYNTHROID, LEVOTHROID) 50 MCG tablet Take 50 mcg by mouth daily.      . metoprolol (LOPRESSOR) 100 MG tablet Take 100 mg by mouth 2 (two) times daily.         No family history on file.   History   Social History  . Marital Status: Married    Spouse Name: N/A    Number of Children: N/A  . Years of Education: N/A   Occupational History  . Not on file.   Social History Main Topics  . Smoking status: Never Smoker   . Smokeless tobacco: Not on file  . Alcohol Use: Not on file  . Drug Use: No  . Sexually Active:  Not on file   Other Topics Concern  . Not on file   Social History Narrative   Mother of 2 with 4 grandchildren     Review of Systems: General: negative for chills, fever, night sweats or weight changes.  Cardiovascular: negative for chest pain, dyspnea on exertion, edema, orthopnea, palpitations, paroxysmal nocturnal dyspnea or shortness of breath Dermatological: negative for rash Respiratory: negative for cough or wheezing Urologic: negative for hematuria Abdominal: negative for nausea, vomiting, diarrhea, bright red blood per rectum, melena, or hematemesis Neurologic: negative for visual changes, syncope, or dizziness All other systems reviewed and are otherwise negative except as noted above.  Physical Exam: Blood pressure 98/58, pulse 55, temperature 96.9 F (36.1 C), temperature source Oral, resp. rate 18, height 5\' 2"  (1.575 m), weight 48.535 kg (107 lb), SpO2 96.00%.  General appearance: alert, cooperative, appears stated age and no distress Neck: no adenopathy, no carotid bruit, no JVD, supple, symmetrical, trachea midline and thyroid not enlarged, symmetric, no tenderness/mass/nodules Lungs: clear to auscultation bilaterally Heart: regular rate and rhythm, S1, S2 normal, no murmur, click, rub or gallop Abdomen: soft, non-tender; bowel sounds normal; no masses,  no organomegaly Extremities: extremities normal, atraumatic, no cyanosis  or edema Pulses: 2+ and symmetric Skin: Skin color, texture, turgor normal. No rashes or lesions Neurologic: Grossly normal    Labs:   Results for orders placed during the hospital encounter of 12/15/11 (from the past 24 hour(s))  BASIC METABOLIC PANEL     Status: Abnormal   Collection Time   12/15/11  7:55 AM      Component Value Range   Sodium 142  135 - 145 mEq/L   Potassium 3.8  3.5 - 5.1 mEq/L   Chloride 104  96 - 112 mEq/L   CO2 27  19 - 32 mEq/L   Glucose, Bld 87  70 - 99 mg/dL   BUN 13  6 - 23 mg/dL   Creatinine, Ser 1.19   0.50 - 1.10 mg/dL   Calcium 9.5  8.4 - 14.7 mg/dL   GFR calc non Af Amer 86 (*) >90 mL/min   GFR calc Af Amer >90  >90 mL/min  CBC     Status: Normal   Collection Time   12/15/11  7:55 AM      Component Value Range   WBC 7.3  4.0 - 10.5 K/uL   RBC 4.29  3.87 - 5.11 MIL/uL   Hemoglobin 13.6  12.0 - 15.0 g/dL   HCT 82.9  56.2 - 13.0 %   MCV 94.9  78.0 - 100.0 fL   MCH 31.7  26.0 - 34.0 pg   MCHC 33.4  30.0 - 36.0 g/dL   RDW 86.5  78.4 - 69.6 %   Platelets 222  150 - 400 K/uL  PROTIME-INR     Status: Normal   Collection Time   12/15/11  7:55 AM      Component Value Range   Prothrombin Time 13.0  11.6 - 15.2 seconds   INR 0.96  0.00 - 1.49     Radiology/Studies: No results found.  EKG:NSR with ant TWI, old  ASSESSMENT AND PLAN:  Principal Problem:  *Unstable angina Active Problems:  Abnormal nuclear cardiac imaging test  CAD, PCI '02, '04. CABG X 3 2004  Dyslipidemia  Hypothyroid  Plan- diagnostic cath today.  Deland Pretty, PA-C 12/15/2011, 1:52 PM

## 2011-12-15 NOTE — CV Procedure (Signed)
Evelyn [name], Evelyn [name] Female, 71 y.o., 24-Aug-1940  Location: [facility]  MRN: [id number]  CSN: [id number]  CARDIAC CATHETERIZATION REPORT   Procedures performed:  1. Left heart catheterization  2. Selective coronary angiography  3. Selective angiography of saphenous vein graft bypasses 4. Selective angiography of left internal mammary artery bypass 5. Left ventriculography  6. 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 A recent stress test showed marked ST segment depression  Consent: The risks, benefits, and details of the procedure were explained to the patient. Risks including death, MI, stroke, bleeding, limb ischemia, renal failure and allergy were described and accepted by the patient. Informed written consent was obtained prior to proceeding.  Technique: The patient was brought to the cardiac catheterization laboratory in the fasting state. He was prepped and draped in the usual sterile fashion. Local anesthesia with 1% lidocaine was administered to the right groin area. Using the modified Seldinger technique a 5 French right common femoral artery sheath was introduced without difficulty. Under fluoroscopic guidance, using 5 [place] JL4, JR and angled pigtail catheters, selective cannulation of the left coronary artery, right coronary artery and left ventricle were respectively performed. The JR catheter was also used to selectively  cannulate the saphenous vein graft bypasses in the left internal mammary artery. Several coronary angiograms in a variety of projections were recorded, as well as a left ventriculogram in the RAO projection. Left ventricular pressure and a pull back to the aorta were recorded. An aortogram in the LAO projection was also performed No immediate complications occurred. At the end of the procedure, all catheters were removed. After the procedure, hemostasis will be achieved with manual pressure.  Contrast used: 100 mL Omnipaque  Angiographic Findings:  1. The left main coronary artery is free of significant atherosclerosis and bifurcates in the usual fashion into the left anterior descending artery and left circumflex coronary artery, but the LAD artery is flush occluded at its ostium.  2. The left anterior descending artery is ostially occluded. A short segment of the proximal LAD fills via the saphenous vein graft bypass to the very large first diagonal artery. The segments also supplies the first septal artery. Shortly following the takeoff of the first septal artery the LAD artery is again occluded at the level of the previously placed stents. The entire stented segment is totally occluded. Beyond the last stents there is flow provided via the LIMA bypass to the distal LAD. The distal LAD is fairly small in caliber and has mild luminal irregularities but no significant stenoses.  3. The left circumflex coronary artery is a medium-size vessel non- dominant vessel that generates only one major oblique marginal artery. There is evidence of minimal luminal irregularities and no calcification. No hemodynamically meaningful stenoses are seen. 4. The right coronary artery is a medium-size dominant vessel that generates a bifurcate posterior lateral ventricular system as well as the posterior descending artery. There is evidence of no luminal irregularities and no calcification. No hemodynamically  meaningful  stenoses are seen.  5. The left ventricle is normal in size. The left ventricle systolic function is normal with an estimated ejection fraction of 60%. Regional wall motion abnormalities are not seen. No left ventricular thrombus is seen. There is no mitral insufficiency. There is no aortic valve stenosis by pullback. The left ventricular end-diastolic pressure is 6 mm Hg.  6. The ascending aorta appears mildly dilated. There is mild to moderate centrally directed aortic insufficiency 7. The left internal mammary artery bypass to the mid distal LAD is widely patent. No major diagonal arteries are seen taking off the distal LAD. A large thoracic branches seen taking off the very proximal segment of the LIMA. 8. The saphenous vein graft bypass to the OM artery is totally occluded shortly following its ostium 9. The saphenous vein graft bypass to the first diagonal artery is a large and widely patent conduit. There are mild luminal irregularities but no significant stenoses. The diagonal artery supplies arterial flow to the entire lateral wall. It also provides retrograde flow to the proximal LAD artery and the first septal branch. The ostium of the first diagonal artery has an approximately 80-90% stenosis.   IMPRESSION:  Total occlusion of the LAD artery. There is likely ischemia in the proximal septum since the first septal artery receives flow via the diagonal bypass and there is an ostial diagonal artery stenosis. In addition there are likely several occluded septal branches that used to take off in the link the territory of the mid LAD artery stents.   All the major epicardial territories however are well supplied with coronary flow.  The ascending aortic aneurysm remains small and aortic insufficiency is only mild to moderate.   RECOMMENDATION:  Continue medical therapy.

## 2011-12-29 NOTE — H&P (Signed)
Physician Assistant Signed Cardiology Progress Notes 12/15/2011 1:51 PM  Patient ID: Evelyn [name] MRN: [id number], DOB/AGE: August 26, 1940    Admit date: 12/15/2011     Primary Physician: No primary provider on file. Primary Cardiologist: Dr Alanda Amass   HPI: 71 y/o with a history of prior CABG as described in problem list. In May she had seen Dr Alanda Amass for chest pain. He suggested a Myoview but she had to reschedule it. It was recently done as an OP and was abnormal. She is admitted now for diagnostic cath. She has occasional chest pain, no NTG use.     Problem List: Past Medical History   Diagnosis  Date   .  Hypothyroid     .  Dyslipidemia     .  CAD (coronary artery disease)     .  GERD (gastroesophageal reflux disease)        Past Surgical History   Procedure  Date   .  Back surgery  2009   .  Coronary artery bypass graft  2004      Allergies:  Allergies   Allergen  Reactions   .  Sulfa Antibiotics  Swelling        Home Medications Prescriptions prior to admission   Medication  Sig  Dispense  Refill   .  aspirin EC 81 MG tablet  Take 81 mg by mouth daily.         [name]  atorvastatin (LIPITOR) 40 MG tablet  Take 60 mg by mouth daily.         [name]  esomeprazole (NEXIUM) 40 MG capsule  Take 40 mg by mouth daily before breakfast.         .  HYDROcodone-acetaminophen (VICODIN) 5-500 MG per tablet  Take 1 tablet by mouth every 12 (twelve) hours as needed. For pain         .  HYDROCORTISONE EX  Apply 1 application topically 2 (two) times daily as needed. For skin irritation         .  levothyroxine (SYNTHROID, LEVOTHROID) 50 MCG tablet  Take 50 mcg by mouth daily.         .  metoprolol (LOPRESSOR) 100 MG tablet  Take 100 mg by mouth 2 (two) times daily.              No family history on file.      History       Social History   .  Marital Status:  Married       Spouse Name:  N/A       Number of Children:  N/A   .  Years of Education:  N/A         Occupational History   .  Not on file.       Social History Main Topics   .  Smoking status:  Never Smoker    .  Smokeless tobacco:  Not on file   .  Alcohol Use:  Not on file   .  Drug Use:  No   .  Sexually Active:  Not on file       Other Topics  Concern   .  Not on file       Social History Narrative     Mother of 2 with 4 grandchildren        Review of Systems: General: negative for chills, fever, night sweats or weight changes.   Cardiovascular: negative for chest pain, dyspnea on exertion,  edema, orthopnea, palpitations, paroxysmal nocturnal dyspnea or shortness of breath Dermatological: negative for rash Respiratory: negative for cough or wheezing Urologic: negative for hematuria Abdominal: negative for nausea, vomiting, diarrhea, bright red blood per rectum, melena, or hematemesis Neurologic: negative for visual changes, syncope, or dizziness All other systems reviewed and are otherwise negative except as noted above.   Physical Exam: Blood pressure 98/58, pulse 55, temperature 96.9 F (36.1 C), temperature source Oral, resp. rate 18, height 5\' 2"  (1.575 m), weight 48.535 kg (107 lb), SpO2 96.00%.  General appearance: alert, cooperative, appears stated age and no distress Neck: no adenopathy, no carotid bruit, no JVD, supple, symmetrical, trachea midline and thyroid not enlarged, symmetric, no tenderness/mass/nodules Lungs: clear to auscultation bilaterally Heart: regular rate and rhythm, S1, S2 normal, no murmur, click, rub or gallop Abdomen: soft, non-tender; bowel sounds normal; no masses,  no organomegaly Extremities: extremities normal, atraumatic, no cyanosis or edema Pulses: 2+ and symmetric Skin: Skin color, texture, turgor normal. No rashes or lesions Neurologic: Grossly normal       Labs:   Results for orders placed during the hospital encounter of 12/15/11 (from the past 24 hour(s))   BASIC METABOLIC PANEL     Status: Abnormal      Collection Time     12/15/11  7:55 AM       Component  Value  Range     Sodium  142   135 - 145 mEq/L     Potassium  3.8   3.5 - 5.1 mEq/L     Chloride  104   96 - 112 mEq/L     CO2  27   19 - 32 mEq/L     Glucose, Bld  87   70 - 99 mg/dL     BUN  13   6 - 23 mg/dL     Creatinine, Ser  0.98   0.50 - 1.10 mg/dL     Calcium  9.5   8.4 - 10.5 mg/dL     GFR calc non Af Amer  86 (*)  >90 mL/min     GFR calc Af Amer  >90   >90 mL/min   CBC     Status: Normal     Collection Time     12/15/11  7:55 AM       Component  Value  Range     WBC  7.3   4.0 - 10.5 K/uL     RBC  4.29   3.87 - 5.11 MIL/uL     Hemoglobin  13.6   12.0 - 15.0 g/dL     HCT  11.9   14.7 - 46.0 %     MCV  94.9   78.0 - 100.0 fL     MCH  31.7   26.0 - 34.0 pg     MCHC  33.4   30.0 - 36.0 g/dL     RDW  82.9   56.2 - 15.5 %     Platelets  222   150 - 400 K/uL   PROTIME-INR     Status: Normal     Collection Time     12/15/11  7:55 AM       Component  Value  Range     Prothrombin Time  13.0   11.6 - 15.2 seconds     INR  0.96   0.00 - 1.49      Radiology/Studies: No results found.   EKG:NSR with ant TWI, old  ASSESSMENT AND PLAN:   Principal Problem:  *Unstable angina Active Problems:  Abnormal nuclear cardiac imaging test  CAD, PCI '02, '04. CABG X 3 2004  Dyslipidemia  Hypothyroid Plan- diagnostic cath today.   SignedAbelino Derrick, PA-C 12/15/2011, 1:52 PM  Cosigned by: Thurmon Fair, MD    [12/15/2011 2:12

## 2012-11-24 ENCOUNTER — Other Ambulatory Visit: Payer: Self-pay | Admitting: *Deleted

## 2012-11-24 DIAGNOSIS — I341 Nonrheumatic mitral (valve) prolapse: Secondary | ICD-10-CM

## 2012-11-24 DIAGNOSIS — R0989 Other specified symptoms and signs involving the circulatory and respiratory systems: Secondary | ICD-10-CM

## 2012-11-24 DIAGNOSIS — I351 Nonrheumatic aortic (valve) insufficiency: Secondary | ICD-10-CM

## 2012-11-29 ENCOUNTER — Telehealth: Payer: Self-pay | Admitting: Cardiovascular Disease

## 2012-11-29 NOTE — Telephone Encounter (Signed)
This message came from the answering service! She has a question,is it okay for her dentist to do dental work?

## 2012-11-29 NOTE — Telephone Encounter (Signed)
Message forwarded to [facility]. Berlinda Last, LPN to discuss w/ Dr. Alanda Amass.  This note and paper chart# [id number] placed on Dr. Kandis Cocking cart.

## 2012-12-01 NOTE — Telephone Encounter (Signed)
Pt. Called and informed that Dr. Alanda Amass had received her message and gave instructions that it was ok to have dental work

## 2012-12-02 ENCOUNTER — Encounter: Payer: Self-pay | Admitting: Cardiovascular Disease

## 2012-12-07 ENCOUNTER — Other Ambulatory Visit: Payer: Self-pay | Admitting: Cardiovascular Disease

## 2012-12-07 LAB — CBC WITH DIFFERENTIAL/PLATELET
Basophils Absolute: 0 10*3/uL (ref 0.0–0.1)
Basophils Relative: 0 % (ref 0–1)
Eosinophils Absolute: 0.1 10*3/uL (ref 0.0–0.7)
Eosinophils Relative: 1 % (ref 0–5)
HCT: 38.2 % (ref 36.0–46.0)
MCHC: 33.8 g/dL (ref 30.0–36.0)
MCV: 93.6 fL (ref 78.0–100.0)
Monocytes Absolute: 0.6 10*3/uL (ref 0.1–1.0)
Neutro Abs: 3.5 10*3/uL (ref 1.7–7.7)
RDW: 13.9 % (ref 11.5–15.5)

## 2012-12-07 LAB — COMPREHENSIVE METABOLIC PANEL
AST: 14 U/L (ref 0–37)
Alkaline Phosphatase: 52 U/L (ref 39–117)
BUN: 5 mg/dL — ABNORMAL LOW (ref 6–23)
Creat: 0.6 mg/dL (ref 0.50–1.10)
Total Bilirubin: 0.6 mg/dL (ref 0.3–1.2)

## 2012-12-07 LAB — LIPID PANEL
HDL: 53 mg/dL (ref >39)
LDL Cholesterol: 90 mg/dL (ref 0–99)
Total CHOL/HDL Ratio: 3 Ratio

## 2012-12-07 LAB — TSH: TSH: 8.612 u[IU]/mL — ABNORMAL HIGH (ref 0.350–4.500)

## 2012-12-18 ENCOUNTER — Ambulatory Visit (HOSPITAL_COMMUNITY): Payer: Medicare Other

## 2012-12-18 ENCOUNTER — Ambulatory Visit (HOSPITAL_COMMUNITY)
Admission: RE | Admit: 2012-12-18 | Discharge: 2012-12-18 | Disposition: A | Discharge status: Home / Self Care | Payer: Medicare Other | Source: Ambulatory Visit | Attending: Cardiology | Admitting: Cardiology

## 2012-12-18 ENCOUNTER — Encounter (HOSPITAL_COMMUNITY): Payer: Medicare Other

## 2012-12-18 DIAGNOSIS — I059 Rheumatic mitral valve disease, unspecified: Secondary | ICD-10-CM | POA: Insufficient documentation

## 2012-12-18 DIAGNOSIS — I341 Nonrheumatic mitral (valve) prolapse: Secondary | ICD-10-CM

## 2012-12-18 NOTE — Progress Notes (Signed)
2D Echo Performed 12/18/2012    Clearence Ped, RCS

## 2012-12-26 ENCOUNTER — Ambulatory Visit (HOSPITAL_COMMUNITY)
Admission: RE | Admit: 2012-12-26 | Discharge: 2012-12-26 | Disposition: A | Discharge status: Home / Self Care | Payer: Medicare Other | Source: Ambulatory Visit | Attending: Cardiovascular Disease | Admitting: Cardiovascular Disease

## 2012-12-26 DIAGNOSIS — R0989 Other specified symptoms and signs involving the circulatory and respiratory systems: Secondary | ICD-10-CM

## 2012-12-26 NOTE — Progress Notes (Signed)
Carotid Duplex Completed. Flara Storti, BS, RDMS, RVT  

## 2013-02-07 ENCOUNTER — Telehealth: Payer: Self-pay | Admitting: Cardiovascular Disease

## 2013-02-07 MED ORDER — LEVOTHYROXINE SODIUM 75 MCG PO TABS: 75.0000 ug | ORAL_TABLET | Freq: Every day | ORAL | Status: DC

## 2013-02-07 NOTE — Telephone Encounter (Signed)
Refill(s) sent to pharmacy.  Pt informed.

## 2013-02-07 NOTE — Telephone Encounter (Signed)
Will need a new prescription for her thyroid medicine since Dr Alanda Amass increased the dose.

## 2013-08-16 ENCOUNTER — Ambulatory Visit: Payer: Medicare Other | Admitting: Cardiovascular Disease

## 2013-08-17 ENCOUNTER — Ambulatory Visit: Payer: Medicare Other | Admitting: Cardiovascular Disease

## 2013-09-04 ENCOUNTER — Encounter: Payer: Self-pay | Admitting: *Deleted

## 2013-09-05 ENCOUNTER — Telehealth: Payer: Self-pay | Admitting: Cardiovascular Disease

## 2013-09-05 NOTE — Telephone Encounter (Signed)
Closed encounters °

## 2013-09-11 ENCOUNTER — Ambulatory Visit (INDEPENDENT_AMBULATORY_CARE_PROVIDER_SITE_OTHER): Payer: Medicare Other | Admitting: Cardiovascular Disease

## 2013-09-11 ENCOUNTER — Encounter: Payer: Self-pay | Admitting: Cardiovascular Disease

## 2013-09-11 VITALS — BP 170/76 | HR 60 | Ht 62.0 in | Wt 108.7 lb

## 2013-09-11 DIAGNOSIS — I2510 Atherosclerotic heart disease of native coronary artery without angina pectoris: Secondary | ICD-10-CM

## 2013-09-11 DIAGNOSIS — E782 Mixed hyperlipidemia: Secondary | ICD-10-CM

## 2013-09-11 DIAGNOSIS — G459 Transient cerebral ischemic attack, unspecified: Secondary | ICD-10-CM

## 2013-09-11 DIAGNOSIS — I10 Essential (primary) hypertension: Secondary | ICD-10-CM

## 2013-09-11 DIAGNOSIS — I351 Nonrheumatic aortic (valve) insufficiency: Secondary | ICD-10-CM

## 2013-09-11 DIAGNOSIS — I359 Nonrheumatic aortic valve disorder, unspecified: Secondary | ICD-10-CM

## 2013-09-11 DIAGNOSIS — E785 Hyperlipidemia, unspecified: Secondary | ICD-10-CM

## 2013-09-11 DIAGNOSIS — Z79899 Other long term (current) drug therapy: Secondary | ICD-10-CM

## 2013-09-11 MED ORDER — AMLODIPINE BESYLATE 2.5 MG PO TABS: 2.5000 mg | ORAL_TABLET | Freq: Every day | ORAL | Status: DC

## 2013-09-11 MED ORDER — ATORVASTATIN CALCIUM 40 MG PO TABS: 60.0000 mg | ORAL_TABLET | Freq: Every day | ORAL | Status: DC

## 2013-09-11 NOTE — Patient Instructions (Signed)
START Amlodipine 2.5mg  daily.  Restart Atorvastatin 40mg  1 1/2 tablets daily.  Have FASTING Lab work done June 22-30, 2015 at [facility].  Dr. Royann Shiversroitoru recommends that you schedule a follow-up appointment in: 6-8 weeks.

## 2013-09-16 ENCOUNTER — Encounter: Payer: Self-pay | Admitting: Cardiovascular Disease

## 2013-09-16 DIAGNOSIS — I10 Essential (primary) hypertension: Secondary | ICD-10-CM

## 2013-09-16 DIAGNOSIS — Z8673 Personal history of transient ischemic attack (TIA), and cerebral infarction without residual deficits: Secondary | ICD-10-CM | POA: Insufficient documentation

## 2013-09-16 DIAGNOSIS — I351 Nonrheumatic aortic (valve) insufficiency: Secondary | ICD-10-CM | POA: Insufficient documentation

## 2013-09-16 DIAGNOSIS — G459 Transient cerebral ischemic attack, unspecified: Secondary | ICD-10-CM | POA: Insufficient documentation

## 2013-09-16 HISTORY — DX: Essential (primary) hypertension: I10

## 2013-09-16 NOTE — Progress Notes (Signed)
Patient ID: Evelyn [name], female   DOB: 1941-01-18, 73 y.o.   MRN: [id number]     Reason for office visit New Cardiology follow up CAD s/p CABG, AI  Evelyn [name] is a long-term patient of Dr. Terance Ice is here to establish new cardiology followup after his retirement.  She has long-standing history of coronary disease, undergoing her first cardiac catheterization in 1999. She received stents to the LAD artery in 2002 and had to have cutting balloon atherectomy for in-stent restenosis in 2003 followed by a new LAD stent in 2004 for disease progression. Ultimately after again developing restenosis she underwent coronary bypass surgery (LIMA to LAD, SVG to diagonal, SVG to OM in 2004). I first met her when I performed her cardiac catheterization in July 2013. This demonstrated interval total occlusion of the proximal LAD artery although the distant bypasses are all widely patent. Nuclear stress test did not show significant areas of ischemia and left ventricular systolic function was normal. It is felt that she has occasional angina pectoris related to ischemia in the proximal septum since there is no retrograde flow from the bypasses to the proximal LAD. 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 She has treated systemic hypertension and hyperlipidemia and hypothyroidism. She had previous cervical spine surgery with bone grafting and discectomy in 2009.  In August of 2014 she had carotid duplex ultrasound performed without any visible lesions. At the same time she had an echocardiogram that showed no progression of her aortic insufficiency or significant dilatation of the visible parts of the aorta. LVEF was 55-60%. Diastolic dysfunction was described as grade 1.  She stopped her statin roughly one month ago since she was afraid it was giving her arthralgia. The arthralgia has not improved. She has rare episodes of central chest pressure  radiating to her back associated with some nausea. Rarely she has to take nitroglycerin sublingually, the last time about 2 months ago. She had a bad nightmare at that time and woke up screaming that she was dying.  Roughly 2 months ago she had an episode where she was abruptly unable to speak during a conversation with her daughter-in-law. The episode lasted for about 15 minutes and resolved without sequelae. She did not go to seek assistance at that time.  She has chronic mild shortness of breath generally manifested itself when she does housework or walks up hill. It does not interfere with most daily activities. She has occasional orthostatic dizziness. Overall she thinks she is doing quite well. She walks her husband's dog "10 times a day" without difficulty.   Allergies  Allergen Reactions  . Sulfa Antibiotics Swelling    Current Outpatient Prescriptions  Medication Sig Dispense Refill  . aspirin EC 81 MG tablet Take 81 mg by mouth daily.      [name] HYDROcodone-acetaminophen (NORCO/VICODIN) 5-325 MG per tablet Take 1 tablet by mouth every 6 (six) hours as needed for moderate pain.      [name] LEVOTHYROXINE SODIUM PO Take 60 mg by mouth daily.      . metoprolol (LOPRESSOR) 100 MG tablet Take 50-100 mg by mouth 2 (two) times daily. Take 166m AM, 564mAM      . nitroGLYCERIN (NITROSTAT) 0.4 MG SL tablet Place 0.4 mg under the tongue every 5 (five) minutes as needed for chest pain.      . Zoledronic Acid (RECLAST IV) Inject into the vein. Once yearly      .  amLODipine (NORVASC) 2.5 MG tablet Take 1 tablet (2.5 mg total) by mouth daily.  30 tablet  6  . atorvastatin (LIPITOR) 40 MG tablet Take 1.5 tablets (60 mg total) by mouth daily.  45 tablet  6   No current facility-administered medications for this visit.    Past Medical History  Diagnosis Date  . Hypothyroid   . Dyslipidemia   . CAD (coronary artery disease)     CABG LIMA to LAD,vein graft to diagonal,vein graft to OM  . GERD  (gastroesophageal reflux disease)   . Aortic insufficiency     Past Surgical History  Procedure Laterality Date  . Back surgery  2009  . Coronary artery bypass graft  2004    LIMA to LAD,vein graft to diagonal,vein graft to OM  . Coronary angioplasty with stent placement  2002    remote PCI & stenting LAD,  . Coronary angioplasty  2003    cutting balloon atherectomy in-stent stenosis of the LAD.  [name] Nm myocar perf wall motion  12/01/2011    normal study-persistent exercise induced ECG changes raises the concern for ischemia (balanced ischemia).    No family history on file.  History   Social History  . Marital Status: Married    Spouse Name: N/A    Number of Children: N/A  . Years of Education: N/A   Occupational History  . Not on file.   Social History Main Topics  . Smoking status: Never Smoker   . Smokeless tobacco: Never Used  . Alcohol Use: No  . Drug Use: No  . Sexual Activity: Not on file   Other Topics Concern  . Not on file   Social History Narrative   Mother of 2 with 4 grandchildren    Review of systems: The patient specifically denies orthopnea, paroxysmal nocturnal dyspnea, syncope, palpitations, focal neurological deficits, intermittent claudication, lower extremity edema, unexplained weight gain, cough, hemoptysis or wheezing.  The patient also denies abdominal pain, nausea, vomiting, dysphagia, diarrhea, constipation, polyuria, polydipsia, dysuria, hematuria, frequency, urgency, abnormal bleeding or bruising, fever, chills, unexpected weight changes, mood swings, change in skin or hair texture, change in voice quality, auditory or visual problems, allergic reactions or rashes, new musculoskeletal complaints other than usual "aches and pains".   PHYSICAL EXAM BP 170/76  Pulse 60  Ht _0  (1.575 m)  Wt 108 lb 11.2 oz (49.306 kg)  BMI 19.88 kg/m2  General: Alert, oriented x3, no distress Head: no evidence of trauma, PERRL, EOMI, no exophtalmos or  lid lag, no myxedema, no xanthelasma; normal ears, nose and oropharynx Neck: normal jugular venous pulsations and no hepatojugular reflux; brisk carotid pulses without delay and no carotid bruits Chest: clear to auscultation, no signs of consolidation by percussion or palpation, normal fremitus, symmetrical and full respiratory excursions, healthy sternotomy scar Cardiovascular: normal position and quality of the apical impulse, regular rhythm, normal first and second heart sounds, no murmurs, rubs or gallops Abdomen: no tenderness or distention, no masses by palpation, no abnormal pulsatility or arterial bruits, normal bowel sounds, no hepatosplenomegaly Extremities: no clubbing, cyanosis or edema; 2+ radial, ulnar and brachial pulses bilaterally; 2+ right femoral, posterior tibial and dorsalis pedis pulses; 2+ left femoral, posterior tibial and dorsalis pedis pulses; no subclavian or femoral bruits Neurological: grossly nonfocal   EKG: Normal sinus rhythm, nonspecific sagging ST segments in leads V2 through V6 and lead 1 with inverted T waves in leads V1 and V2, unchanged from previous tracings  Lipid Panel  Component Value Date/Time   CHOL 161 12/07/2012 1019   TRIG 91 12/07/2012 1019   HDL 53 12/07/2012 1019   CHOLHDL 3.0 12/07/2012 1019   VLDL 18 12/07/2012 1019   LDLCALC 90 12/07/2012 1019    BMET    Component Value Date/Time   NA 141 12/07/2012 1019   K 4.0 12/07/2012 1019   CL 106 12/07/2012 1019   CO2 27 12/07/2012 1019   GLUCOSE 89 12/07/2012 1019   BUN 5* 12/07/2012 1019   CREATININE 0.60 12/07/2012 1019   CREATININE 0.66 12/15/2011 0755   CALCIUM 9.0 12/07/2012 1019   GFRNONAA 86* 12/15/2011 0755   GFRAA >90 12/15/2011 0755     ASSESSMENT AND PLAN CAD, PCI '02, '04. CABG X 3 2004 She has very infrequent episodes of chest discomfort but these may indeed represent angina related to ischemia in the territory of the proximal LAD is occluded both upstream and downstream, without  direct flow from the left main or from the mammary artery bypass. Her last nuclear stress test was a low risk study and was relatively recent. She has preserved left ventricular systolic function. He does have an abnormal ECG response to exercise.  Aortic insufficiency Related to a very small aortic aneurysm, described as mild on the most recent echocardiogram, mild to moderate in the past. Not hemodynamically important.  Dyslipidemia She should restart her statin especially since her side effects did not resolve with a statin holiday. Recheck lipid profile in about 2-3 months  HTN (hypertension) Her blood pressure is unacceptably high, especially since she has aortic insufficiency and coronary disease. Add amlodipine 2.5 mg once daily.  Possible TIA (transient ischemic attack) Presumptive diagnosis, makes 2 months after the fact. She has or had evaluation of her carotid arteries and does not have any known history of atrial tachyarrhythmia. Continue taking aspirin for now. Improved blood pressure control. If new events occur I would strongly advise to add clopidogrel and may discuss implantation of a loop recorder.   Patient Instructions  START Amlodipine 2.55m daily.  Restart Atorvastatin 433m1 1/2 tablets daily.  Have FASTING Lab work done June 22-30, 2015 at [facility] Dr. CrSallyanne Kusterecommends that you schedule a follow-up appointment in: 6-8 weeks.      Orders Placed This Encounter  Procedures  . Comprehensive metabolic panel  . Lipid panel  . EKG 12-Lead   Meds ordered this encounter  Medications  . HYDROcodone-acetaminophen (NORCO/VICODIN) 5-325 MG per tablet    Sig: Take 1 tablet by mouth every 6 (six) hours as needed for moderate pain.  . Marland KitchenEVOTHYROXINE SODIUM PO    Sig: Take 60 mg by mouth daily.  . Zoledronic Acid (RECLAST IV)    Sig: Inject into the vein. Once yearly  . nitroGLYCERIN (NITROSTAT) 0.4 MG SL tablet    Sig: Place 0.4 mg under the tongue every 5  (five) minutes as needed for chest pain.  . Marland Kitchentorvastatin (LIPITOR) 40 MG tablet    Sig: Take 1.5 tablets (60 mg total) by mouth daily.    Dispense:  45 tablet    Refill:  6  . amLODipine (NORVASC) 2.5 MG tablet    Sig: Take 1 tablet (2.5 mg total) by mouth daily.    Dispense:  30 tablet    Refill:  6    Andric Kerce  MiSanda KleinMD, [facility] [phone]

## 2013-09-16 NOTE — Assessment & Plan Note (Signed)
Her blood pressure is unacceptably high, especially since she has aortic insufficiency and coronary disease. Add amlodipine 2.5 mg once daily.

## 2013-09-16 NOTE — Assessment & Plan Note (Signed)
Related to a very small aortic aneurysm, described as mild on the most recent echocardiogram, mild to moderate in the past. Not hemodynamically important.

## 2013-09-16 NOTE — Assessment & Plan Note (Signed)
Presumptive diagnosis, makes 2 months after the fact. She has or had evaluation of her carotid arteries and does not have any known history of atrial tachyarrhythmia. Continue taking aspirin for now. Improved blood pressure control. If new events occur I would strongly advise to add clopidogrel and may discuss implantation of a loop recorder.

## 2013-09-16 NOTE — Assessment & Plan Note (Signed)
She has very infrequent episodes of chest discomfort but these may indeed represent angina related to ischemia in the territory of the proximal LAD is occluded both upstream and downstream, without direct flow from the left main or from the mammary artery bypass. Her last nuclear stress test was a low risk study and was relatively recent. She has preserved left ventricular systolic function. He does have an abnormal ECG response to exercise.

## 2013-09-16 NOTE — Assessment & Plan Note (Signed)
She should restart her statin especially since her side effects did not resolve with a statin holiday. Recheck lipid profile in about 2-3 months

## 2013-10-05 ENCOUNTER — Telehealth: Payer: Self-pay | Admitting: Cardiovascular Disease

## 2013-10-05 NOTE — Telephone Encounter (Signed)
Needs lab order for her appt in July  Please call

## 2013-10-05 NOTE — Telephone Encounter (Signed)
Returned a call to he patient. She was requesting a "lab slip". Informed her that as long as she goes to a [facility] lab she will not need any paperwork. Confirmed the orders are in the computer. They can see them. If they have any problems when she goes to have blood drawn viewing the orders they will call us.

## 2013-11-08 ENCOUNTER — Other Ambulatory Visit: Payer: Self-pay | Admitting: *Deleted

## 2013-11-08 ENCOUNTER — Telehealth: Payer: Self-pay | Admitting: Cardiovascular Disease

## 2013-11-08 DIAGNOSIS — E782 Mixed hyperlipidemia: Secondary | ICD-10-CM

## 2013-11-08 DIAGNOSIS — Z79899 Other long term (current) drug therapy: Secondary | ICD-10-CM

## 2013-11-08 NOTE — Telephone Encounter (Signed)
Calling to have an order fax to [facility] .[name]. The fax number is [phone].. Thanks

## 2013-11-08 NOTE — Telephone Encounter (Signed)
[facility] and CMET.

## 2013-11-13 LAB — COMPREHENSIVE METABOLIC PANEL
ALBUMIN: 3.9 g/dL (ref 3.5–5.2)
ALT: 16 U/L (ref 0–35)
AST: 18 U/L (ref 0–37)
Alkaline Phosphatase: 49 U/L (ref 39–117)
BILIRUBIN TOTAL: 0.5 mg/dL (ref 0.2–1.2)
BUN: 8 mg/dL (ref 6–23)
CHLORIDE: 104 meq/L (ref 96–112)
CO2: 25 meq/L (ref 19–32)
Calcium: 8.7 mg/dL (ref 8.4–10.5)
Creat: 0.5 mg/dL (ref 0.50–1.10)
Glucose, Bld: 91 mg/dL (ref 70–99)
POTASSIUM: 4.1 meq/L (ref 3.5–5.3)
SODIUM: 139 meq/L (ref 135–145)
TOTAL PROTEIN: 6 g/dL (ref 6.0–8.3)

## 2013-11-13 LAB — LIPID PANEL
CHOLESTEROL: 98 mg/dL (ref 0–200)
HDL: 49 mg/dL (ref >39)
LDL CALC: 39 mg/dL (ref 0–99)
Total CHOL/HDL Ratio: 2 Ratio
Triglycerides: 52 mg/dL (ref <150)
VLDL: 10 mg/dL (ref 0–40)

## 2013-11-20 ENCOUNTER — Encounter: Payer: Self-pay | Admitting: Cardiovascular Disease

## 2013-11-20 ENCOUNTER — Ambulatory Visit (INDEPENDENT_AMBULATORY_CARE_PROVIDER_SITE_OTHER): Payer: Medicare Other | Admitting: Cardiovascular Disease

## 2013-11-20 VITALS — BP 120/70 | HR 75 | Resp 16 | Ht 62.0 in | Wt 107.1 lb

## 2013-11-20 DIAGNOSIS — I25729 Atherosclerosis of autologous artery coronary artery bypass graft(s) with unspecified angina pectoris: Secondary | ICD-10-CM

## 2013-11-20 DIAGNOSIS — I209 Angina pectoris, unspecified: Secondary | ICD-10-CM

## 2013-11-20 DIAGNOSIS — I25810 Atherosclerosis of coronary artery bypass graft(s) without angina pectoris: Secondary | ICD-10-CM

## 2013-11-20 DIAGNOSIS — R079 Chest pain, unspecified: Secondary | ICD-10-CM

## 2013-11-20 DIAGNOSIS — I359 Nonrheumatic aortic valve disorder, unspecified: Secondary | ICD-10-CM

## 2013-11-20 DIAGNOSIS — I351 Nonrheumatic aortic (valve) insufficiency: Secondary | ICD-10-CM

## 2013-11-20 DIAGNOSIS — G458 Other transient cerebral ischemic attacks and related syndromes: Secondary | ICD-10-CM

## 2013-11-20 NOTE — Patient Instructions (Signed)
Dr Royann Shiversroitoru wants you to follow-up in 6 months. You will receive a reminder letter in the mail one months in advance. If you don't receive a letter, please call our office to schedule the follow-up appointment.

## 2013-11-22 NOTE — Progress Notes (Signed)
Normal labs called to patient.  Voiced understanding.

## 2013-11-22 NOTE — Progress Notes (Signed)
Patient ID: Evelyn [name], female   DOB: [birth year], 73 y.o.   MRN: [id number]     Reason for office visit CAD, AI  Evelyn [name] returns in followup roughly 2 months after her last appointment, roughly 4 months after she underwent an episode that was probably a transient ischemic attack with a seizure that lasted for about 15 minutes. She has not had any new neurological problems. She has occasional exertional dyspnea, only when she walks uphill. She denies any angina pectoris. She did not have any improvement in arthralgia after a "statin holiday".  She has long-standing history of coronary disease, undergoing her first cardiac catheterization in 1999. She received stents to the LAD artery in 2002 and had to have cutting balloon atherectomy for in-stent restenosis in 2003 followed by a new LAD stent in 2004 for disease progression. Ultimately after again developing restenosis she underwent coronary bypass surgery (LIMA to LAD, SVG to diagonal, SVG to OM in 2004). I first met her when I performed her cardiac catheterization in July 2013. This demonstrated interval total occlusion of the proximal LAD artery although the distant bypasses are all widely patent. Nuclear stress test did not show significant areas of ischemia and left ventricular systolic function was normal. It is felt that she has occasional angina pectoris related to ischemia in the proximal septum since there is no retrograde flow from the bypasses to the proximal LAD. 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 She has treated systemic hypertension and hyperlipidemia and hypothyroidism. She had previous cervical spine surgery with bone grafting and discectomy in 2009.  In August of 2014 she had carotid duplex ultrasound performed without any visible lesions. At the same time she had an echocardiogram that showed no progression of her aortic insufficiency or significant dilatation of the  visible parts of the aorta. LVEF was 55-60%. Diastolic dysfunction was described as grade 1.    Allergies  Allergen Reactions  . Sulfa Antibiotics Swelling    Current Outpatient Prescriptions  Medication Sig Dispense Refill  . amLODipine (NORVASC) 2.5 MG tablet Take 1 tablet (2.5 mg total) by mouth daily.  30 tablet  6  . aspirin EC 81 MG tablet Take 81 mg by mouth daily.      [name] atorvastatin (LIPITOR) 40 MG tablet Take 1.5 tablets (60 mg total) by mouth daily.  45 tablet  6  . HYDROcodone-acetaminophen (NORCO/VICODIN) 5-325 MG per tablet Take 1 tablet by mouth every 6 (six) hours as needed for moderate pain.      [name] LEVOTHYROXINE SODIUM PO Take 60 mg by mouth daily.      . metoprolol (LOPRESSOR) 100 MG tablet Take 50-100 mg by mouth 2 (two) times daily. Take 165m AM, 5107mAM      . nitroGLYCERIN (NITROSTAT) 0.4 MG SL tablet Place 0.4 mg under the tongue every 5 (five) minutes as needed for chest pain.      . Zoledronic Acid (RECLAST IV) Inject into the vein. Once yearly       No current facility-administered medications for this visit.    Past Medical History  Diagnosis Date  . Hypothyroid   . Dyslipidemia   . CAD (coronary artery disease)     CABG LIMA to LAD,vein graft to diagonal,vein graft to OM  . GERD (gastroesophageal reflux disease)   . Aortic insufficiency     Past Surgical History  Procedure Laterality Date  . Back surgery  2009  .  Coronary artery bypass graft  2004    LIMA to LAD,vein graft to diagonal,vein graft to OM  . Coronary angioplasty with stent placement  2002    remote PCI & stenting LAD,  . Coronary angioplasty  2003    cutting balloon atherectomy in-stent stenosis of the LAD.  [name] Nm myocar perf wall motion  12/01/2011    normal study-persistent exercise induced ECG changes raises the concern for ischemia (balanced ischemia).    No family history on file.  History   Social History  . Marital Status: Married    Spouse Name: N/A    Number of  Children: N/A  . Years of Education: N/A   Occupational History  . Not on file.   Social History Main Topics  . Smoking status: Never Smoker   . Smokeless tobacco: Never Used  . Alcohol Use: No  . Drug Use: No  . Sexual Activity: Not on file   Other Topics Concern  . Not on file   Social History Narrative   Mother of 2 with 4 grandchildren    Review of systems: The patient specifically denies any chest pain at rest or with usual exertion, dyspnea at rest or with exertion, orthopnea, paroxysmal nocturnal dyspnea, syncope, palpitations, focal neurological deficits, intermittent claudication, lower extremity edema, unexplained weight gain, cough, hemoptysis or wheezing.  The patient also denies abdominal pain, nausea, vomiting, dysphagia, diarrhea, constipation, polyuria, polydipsia, dysuria, hematuria, frequency, urgency, abnormal bleeding or bruising, fever, chills, unexpected weight changes, mood swings, change in skin or hair texture, change in voice quality, auditory or visual problems, allergic reactions or rashes, new musculoskeletal complaints other than usual "aches and pains".   PHYSICAL EXAM BP 120/70  Pulse 75  Resp 16  Ht _0  (1.575 m)  Wt 107 lb 1.6 oz (48.58 kg)  BMI 19.58 kg/m2 General: Alert, oriented x3, no distress  Head: no evidence of trauma, PERRL, EOMI, no exophtalmos or lid lag, no myxedema, no xanthelasma; normal ears, nose and oropharynx  Neck: normal jugular venous pulsations and no hepatojugular reflux; brisk carotid pulses without delay and no carotid bruits  Chest: clear to auscultation, no signs of consolidation by percussion or palpation, normal fremitus, symmetrical and full respiratory excursions, healthy sternotomy scar  Cardiovascular: normal position and quality of the apical impulse, regular rhythm, normal first and second heart sounds, no murmurs, rubs or gallops  Abdomen: no tenderness or distention, no masses by palpation, no abnormal  pulsatility or arterial bruits, normal bowel sounds, no hepatosplenomegaly  Extremities: no clubbing, cyanosis or edema; 2+ radial, ulnar and brachial pulses bilaterally; 2+ right femoral, posterior tibial and dorsalis pedis pulses; 2+ left femoral, posterior tibial and dorsalis pedis pulses; no subclavian or femoral bruits  Neurological: grossly nonfocal   EKG: Normal sinus rhythm, nonspecific sagging ST segments in leads V2 through V6 and lead 1 with inverted T waves in leads V1 and V2, unchanged from previous tracings   Lipid Panel     Component Value Date/Time   CHOL 98 11/12/2013 0946   TRIG 52 11/12/2013 0946   HDL 49 11/12/2013 0946   CHOLHDL 2.0 11/12/2013 0946   VLDL 10 11/12/2013 0946   LDLCALC 39 11/12/2013 0946    BMET    Component Value Date/Time   NA 139 11/12/2013 0948   K 4.1 11/12/2013 0948   CL 104 11/12/2013 0948   CO2 25 11/12/2013 0948   GLUCOSE 91 11/12/2013 0948   BUN 8 11/12/2013 0948   CREATININE 0.50  11/12/2013 0948   CREATININE 0.66 12/15/2011 0755   CALCIUM 8.7 11/12/2013 0948   GFRNONAA 86* 12/15/2011 0755   GFRAA >90 12/15/2011 0755     ASSESSMENT AND PLAN CAD, PCI '02, '04. CABG X 3 2004  She has very infrequent episodes of chest discomfort but these may indeed represent angina related to ischemia in the territory of the proximal LAD is occluded both upstream and downstream, without direct flow from the left main or from the mammary artery bypass. Her last nuclear stress test was a low risk study and was relatively recent. She has preserved left ventricular systolic function. He does have an abnormal ECG response to exercise.  Aortic insufficiency  Related to a very small aortic aneurysm, described as mild on the most recent echocardiogram, mild to moderate in the past. Not hemodynamically important.  Dyslipidemia  She should restart her statin especially since her side effects did not resolve with a statin holiday. Recheck lipid profile in about 2-3 months    HTN (hypertension)  Her blood pressure is unacceptably high, especially since she has aortic insufficiency and coronary disease. Add amlodipine 2.5 mg once daily.  Possible TIA (transient ischemic attack)  Presumptive diagnosis, makes 2 months after the fact. She has or had evaluation of her carotid arteries and does not have any known history of atrial tachyarrhythmia. Continue taking aspirin for now. Improved blood pressure control. If new events occur I would strongly advise to add clopidogrel and may discuss implantation of a loop recorder.  Orders Placed This Encounter  Procedures  . EKG 12-Lead   No orders of the defined types were placed in this encounter.    Holli Humbles, MD, [place] [phone] office [phone] pager

## 2014-01-14 ENCOUNTER — Telehealth: Payer: Self-pay | Admitting: Cardiovascular Disease

## 2014-01-14 MED ORDER — METOPROLOL TARTRATE 100 MG PO TABS: 50.0000 mg | ORAL_TABLET | Freq: Two times a day (BID) | ORAL | Status: DC

## 2014-01-14 NOTE — Telephone Encounter (Signed)
Pt says she still have not received her Metoprolol 100 mg. Please call today to [phone].

## 2014-01-14 NOTE — Telephone Encounter (Signed)
Rx refill sent to patient pharmacy   

## 2014-01-15 ENCOUNTER — Telehealth: Payer: Self-pay | Admitting: Cardiovascular Disease

## 2014-01-15 NOTE — Telephone Encounter (Signed)
Confirmed with patient her metoprolol dose, freq. She takes  AM and  PM. Called pharmacy to update/clarify.

## 2014-01-15 NOTE — Telephone Encounter (Signed)
They received an electronic prescription for Metoprolol yesterday.They said the directions did not make sense.

## 2014-03-21 ENCOUNTER — Other Ambulatory Visit: Payer: Self-pay | Admitting: *Deleted

## 2014-03-21 ENCOUNTER — Telehealth: Payer: Self-pay | Admitting: Cardiovascular Disease

## 2014-03-21 MED ORDER — ATORVASTATIN CALCIUM 40 MG PO TABS: 60.0000 mg | ORAL_TABLET | Freq: Every day | ORAL | Status: DC

## 2014-03-21 NOTE — Telephone Encounter (Signed)
Pt's insurance is refusing to pay for 60mg  daily and only paying willing to cover  40mg  daily.  Can appeal at [phone] ID# [id number].  The extra 15 pills cost her $8 and she says its not really worth the trouble but was just wondering why all of a sudden this is happening.  I let her know I would forward to Dr C and his nurse to see if it is worth calling or just pay the extra $8

## 2014-03-21 NOTE — Telephone Encounter (Signed)
Patient has been on Atorvastatin 60mg  daily for years.  Insurance has always paid until today she had to make 2 co-pays (which was only $8) which she states she doesn't mind paying but wanted it checked out.  Per pharmacist insurance will no longer pay for the 60mg  qd to either go down to 40mg  or up to 80mg .  Patient informed Dr. Salena SanerC is out of town and will respond sometime next week.  Message to Dr. Salena Saner for review and advise.

## 2014-03-21 NOTE — Telephone Encounter (Signed)
Pt called in stating that she is having a problem with getting her Lipitor prescription refilled the quanity is wrong. Please call  Thanks

## 2014-03-21 NOTE — Telephone Encounter (Signed)
Called patient and unable to leave message, machine picks up but ask to enter remote access code.  Will try again later

## 2014-03-21 NOTE — Telephone Encounter (Signed)
Follow up ° ° ° ° ° °Returning Kelly's call °

## 2014-03-28 NOTE — Telephone Encounter (Signed)
If she wants, I suggest the following solution: Please give her a Rx for 80 mg daily. She will take 80 mg alternating with 40 mg (1/2 tab) every other day. That way she will avoid 2 co-pays. If she does not mind 2 copays, continue taking 20 + 40 as two separate Rx.

## 2014-03-29 MED ORDER — ATORVASTATIN CALCIUM 80 MG PO TABS: ORAL_TABLET | ORAL | Status: DC

## 2014-03-29 NOTE — Telephone Encounter (Signed)
Discussed options for atorvastatin - she would like to have the 80mg  tablets and alternate 80mg  w/40mg .  New Rx sent to pharmacy.

## 2014-04-25 ENCOUNTER — Encounter (HOSPITAL_COMMUNITY): Payer: Self-pay | Admitting: Cardiovascular Disease

## 2014-06-10 ENCOUNTER — Other Ambulatory Visit: Payer: Self-pay | Admitting: Cardiovascular Disease

## 2014-06-14 ENCOUNTER — Ambulatory Visit: Payer: Medicare Other | Admitting: Cardiovascular Disease

## 2014-06-18 ENCOUNTER — Telehealth: Payer: Self-pay | Admitting: Cardiovascular Disease

## 2014-06-26 NOTE — Telephone Encounter (Signed)
Closed encounter °

## 2014-06-28 ENCOUNTER — Telehealth: Payer: Self-pay | Admitting: *Deleted

## 2014-06-28 MED ORDER — ROSUVASTATIN CALCIUM 20 MG PO TABS: 20.0000 mg | ORAL_TABLET | Freq: Every day | ORAL | Status: DC

## 2014-06-28 NOTE — Telephone Encounter (Signed)
[facility] Atorvastatin as prescribed.  She currently takes 40mg  1 1/2 tablets daily.  Dr. Salena Saner will switch to crestor 20mg  and [facility]  Patient in agreement and voiced understanding.

## 2014-07-11 ENCOUNTER — Ambulatory Visit (INDEPENDENT_AMBULATORY_CARE_PROVIDER_SITE_OTHER): Payer: Medicare Other | Admitting: Cardiovascular Disease

## 2014-07-11 VITALS — BP 130/72 | HR 57 | Resp 12 | Ht 62.0 in | Wt 110.2 lb

## 2014-07-11 DIAGNOSIS — I10 Essential (primary) hypertension: Secondary | ICD-10-CM

## 2014-07-11 DIAGNOSIS — I25729 Atherosclerosis of autologous artery coronary artery bypass graft(s) with unspecified angina pectoris: Secondary | ICD-10-CM

## 2014-07-11 DIAGNOSIS — E785 Hyperlipidemia, unspecified: Secondary | ICD-10-CM | POA: Diagnosis not present

## 2014-07-11 DIAGNOSIS — Z79899 Other long term (current) drug therapy: Secondary | ICD-10-CM | POA: Diagnosis not present

## 2014-07-11 DIAGNOSIS — R130 Aphagia: Secondary | ICD-10-CM

## 2014-07-11 DIAGNOSIS — G451 Carotid artery syndrome (hemispheric): Secondary | ICD-10-CM

## 2014-07-11 DIAGNOSIS — I351 Nonrheumatic aortic (valve) insufficiency: Secondary | ICD-10-CM

## 2014-07-11 DIAGNOSIS — R0789 Other chest pain: Secondary | ICD-10-CM

## 2014-07-11 DIAGNOSIS — R4701 Aphasia: Secondary | ICD-10-CM | POA: Diagnosis not present

## 2014-07-11 LAB — COMPREHENSIVE METABOLIC PANEL
ALBUMIN: 4.5 g/dL (ref 3.5–5.2)
ALK PHOS: 51 U/L (ref 39–117)
ALT: 15 U/L (ref 0–35)
AST: 18 U/L (ref 0–37)
BUN: 10 mg/dL (ref 6–23)
CHLORIDE: 100 meq/L (ref 96–112)
CO2: 28 meq/L (ref 19–32)
CREATININE: 0.59 mg/dL (ref 0.50–1.10)
Calcium: 9.8 mg/dL (ref 8.4–10.5)
GLUCOSE: 90 mg/dL (ref 70–99)
Potassium: 4.5 mEq/L (ref 3.5–5.3)
Sodium: 138 mEq/L (ref 135–145)
Total Bilirubin: 0.7 mg/dL (ref 0.2–1.2)
Total Protein: 7.1 g/dL (ref 6.0–8.3)

## 2014-07-11 LAB — LIPID PANEL
CHOL/HDL RATIO: 2 ratio
CHOLESTEROL: 127 mg/dL (ref 0–200)
HDL: 62 mg/dL (ref >=46)
LDL Cholesterol: 53 mg/dL (ref 0–99)
Triglycerides: 62 mg/dL (ref <150)
VLDL: 12 mg/dL (ref 0–40)

## 2014-07-11 NOTE — Patient Instructions (Signed)
Your physician has recommended that you wear an event monitor. Event monitors are medical devices that record the heart's electrical activity. Doctors most often us these monitors to diagnose arrhythmias. Arrhythmias are problems with the speed or rhythm of the heartbeat. The monitor is a small, portable device. You can wear one while you do your normal daily activities. This is usually used to diagnose what is causing palpitations/syncope (passing out).  Your physician recommends that you return for lab work in[facility]  Dr. Royann Shiversroitoru recommends that you schedule a follow-up appointment in: 6 months.

## 2014-07-14 ENCOUNTER — Encounter: Payer: Self-pay | Admitting: Cardiovascular Disease

## 2014-07-14 NOTE — Progress Notes (Signed)
Patient ID: Evelyn [name], female   DOB: November 11, 1940, 74 y.o.   MRN: [id number]     Reason for office visit CAD s/p CABG, AI, possible TIA  About a year ago, Evelyn [name] had a possible TIA and/or seizure. No new events occurred until a a few weeks ago when she had a brief episode of being unable to say certain words (lasting a minute or two). She has palpitations every 2 or 3 months. She has occasional random episodes of chest pain radiating to her back, not exertional or with other clear triggers.  She has long-standing history of coronary disease, undergoing her first cardiac catheterization in 1999. She received stents to the LAD artery in 2002 and had to have cutting balloon atherectomy for in-stent restenosis in 2003 followed by a new LAD stent in 2004 for disease progression. Ultimately after again developing restenosis she underwent coronary bypass surgery (LIMA to LAD, SVG to diagonal, SVG to OM in 2004). She had cardiac catheterization in July 2013. This demonstrated interval total occlusion of the proximal LAD artery although the distant bypasses are all widely patent. Nuclear stress test did not show significant areas of ischemia and left ventricular systolic function was normal. It is felt that she has occasional angina pectoris related to ischemia in the proximal septum since there is no retrograde flow from the bypasses to the proximal LAD. 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 She has treated systemic hypertension and hyperlipidemia and hypothyroidism. She had previous cervical spine surgery with bone grafting and discectomy in 2009.  In August of 2014 she had carotid duplex ultrasound performed without any visible lesions. At the same time she had an echocardiogram that showed no progression of her aortic insufficiency or significant dilatation of the visible parts of the aorta. LVEF was 55-60%. Diastolic dysfunction was described  as grade 1.   Allergies  Allergen Reactions  . Sulfa Antibiotics Swelling    Current Outpatient Prescriptions  Medication Sig Dispense Refill  . aspirin EC 81 MG tablet Take 81 mg by mouth daily.    [name] HYDROcodone-acetaminophen (NORCO/VICODIN) 5-325 MG per tablet Take 1 tablet by mouth every 6 (six) hours as needed for moderate pain.    [name] LEVOTHYROXINE SODIUM PO Take 60 mg by mouth daily.    . metoprolol (LOPRESSOR) 100 MG tablet Take 50-100 mg by mouth 2 (two) times daily. Take  AM,  PM    . nitroGLYCERIN (NITROSTAT) 0.4 MG SL tablet Place 0.4 mg under the tongue every 5 (five) minutes as needed for chest pain.    . rosuvastatin (CRESTOR) 20 MG tablet Take 1 tablet (20 mg total) by mouth daily. 30 tablet 6  . Zoledronic Acid (RECLAST IV) Inject into the vein. Once yearly     No current facility-administered medications for this visit.    Past Medical History  Diagnosis Date  . Hypothyroid   . Dyslipidemia   . CAD (coronary artery disease)     CABG LIMA to LAD,vein graft to diagonal,vein graft to OM  . GERD (gastroesophageal reflux disease)   . Aortic insufficiency     Past Surgical History  Procedure Laterality Date  . Back surgery  2009  . Coronary artery bypass graft  2004    LIMA to LAD,vein graft to diagonal,vein graft to OM  . Coronary angioplasty with stent placement  2002    remote PCI & stenting LAD,  . Coronary angioplasty  2003  cutting balloon atherectomy in-stent stenosis of the LAD.  [name] Nm myocar perf wall motion  12/01/2011    normal study-persistent exercise induced ECG changes raises the concern for ischemia (balanced ischemia).  . Left heart catheterization with coronary angiogram N/A 12/15/2011    Procedure: LEFT HEART CATHETERIZATION WITH CORONARY ANGIOGRAM;  Surgeon: Evelyn Fair, MD;  Location: [facility];  Service: Cardiovascular;  Laterality: N/A;    No family history on file.  History   Social History  . Marital Status: Married     Spouse Name: N/A  . Number of Children: N/A  . Years of Education: N/A   Occupational History  . Not on file.   Social History Main Topics  . Smoking status: Never Smoker   . Smokeless tobacco: Never Used  . Alcohol Use: No  . Drug Use: No  . Sexual Activity: Not on file   Other Topics Concern  . Not on file   Social History Narrative   Mother of 2 with 4 grandchildren    Review of systems: The patient specifically denies dyspnea at rest or with exertion, orthopnea, paroxysmal nocturnal dyspnea, syncope, palpitations, intermittent claudication, lower extremity edema, unexplained weight gain, cough, hemoptysis or wheezing.  The patient also denies abdominal pain, nausea, vomiting, dysphagia, diarrhea, constipation, polyuria, polydipsia, dysuria, hematuria, frequency, urgency, abnormal bleeding or bruising, fever, chills, unexpected weight changes, mood swings, change in skin or hair texture, change in voice quality, auditory or visual problems, allergic reactions or rashes, new musculoskeletal complaints other than usual "aches and pains".   PHYSICAL EXAM BP 130/72 mmHg  Pulse 57  Resp 12  Ht  (1.575 m)  Wt 110 lb 3.2 oz (49.986 kg)  BMI 20.15 kg/m2 General: Alert, oriented x3, no distress  Head: no evidence of trauma, PERRL, EOMI, no exophtalmos or lid lag, no myxedema, no xanthelasma; normal ears, nose and oropharynx  Neck: normal jugular venous pulsations and no hepatojugular reflux; brisk carotid pulses without delay and no carotid bruits  Chest: clear to auscultation, no signs of consolidation by percussion or palpation, normal fremitus, symmetrical and full respiratory excursions, healthy sternotomy scar  Cardiovascular: normal position and quality of the apical impulse, regular rhythm, normal first and second heart sounds, no murmurs, rubs or gallops  Abdomen: no tenderness or distention, no masses by palpation, no abnormal pulsatility or arterial bruits, normal  bowel sounds, no hepatosplenomegaly  Extremities: no clubbing, cyanosis or edema; 2+ radial, ulnar and brachial pulses bilaterally; 2+ right femoral, posterior tibial and dorsalis pedis pulses; 2+ left femoral, posterior tibial and dorsalis pedis pulses; no subclavian or femoral bruits  Neurological: grossly nonfocal   EKG: NSR, T wave inversion V1-V2 and nonspecific ST changes in V5-V6 and the inferior leads (old)  Lipid Panel     Component Value Date/Time   CHOL 127 07/11/2014 0913   TRIG 62 07/11/2014 0913   HDL 62 07/11/2014 0913   CHOLHDL 2.0 07/11/2014 0913   VLDL 12 07/11/2014 0913   LDLCALC 53 07/11/2014 0913    BMET    Component Value Date/Time   NA 138 07/11/2014 0913   K 4.5 07/11/2014 0913   CL 100 07/11/2014 0913   CO2 28 07/11/2014 0913   GLUCOSE 90 07/11/2014 0913   BUN 10 07/11/2014 0913   CREATININE 0.59 07/11/2014 0913   CREATININE 0.66 12/15/2011 0755   CALCIUM 9.8 07/11/2014 0913   GFRNONAA 86* 12/15/2011 0755   GFRAA >90 12/15/2011 0755     ASSESSMENT AND PLAN  CAD, PCI '02, '04. CABG X 3 2004  She has very infrequent episodes of chest discomfort possibly related to ischemia in the territory of the proximal LAD is occluded both upstream and downstream, without direct flow from the left main or from the mammary artery bypass. Her last nuclear stress test was a low risk study July 2013. She has preserved left ventricular systolic function. She has an abnormal ECG response to exercise.  If chest pain pattern changes, consider imaging for ascending aortic aneurysm. Aortic insufficiency  Related to a very small aortic aneurysm, described as mild on the most recent echocardiogram, mild to moderate in the past. Not hemodynamically important.  Dyslipidemia  Excellent parameters on statin (checked today).  HTN (hypertension)  Her blood pressure is good today Possible TIA (transient ischemic attack)  Presumptive diagnosis, second episode in a year or  so. She had no stenosis on duplex US of the carotid arteries and does not have any known history of atrial tachyarrhythmia. Continue taking aspirin. Improved blood pressure control. 30 day event monitor for Afib. Consider clopidogrel. Orders Placed This Encounter  Procedures  . Lipid panel  . Comprehensive metabolic panel  . EKG 12-Lead  . Cardiac event monitor   Evelyn [name]  Evelyn FairMihai Ivyanna Sibert, MD, [facility] [phone] office [phone] pager

## 2014-08-13 ENCOUNTER — Encounter: Payer: Self-pay | Admitting: *Deleted

## 2014-08-26 ENCOUNTER — Telehealth: Payer: Self-pay | Admitting: Cardiovascular Disease

## 2014-08-26 NOTE — Telephone Encounter (Signed)
Spoke to pt, reported results & Dr. Renaye Rakers's recommendations. Pt expressed understanding.

## 2014-08-26 NOTE — Telephone Encounter (Signed)
Pt called in stating that she had some blood work done on 2/25 and she has not heard from anyone about the results. Please call back. She says that she will be leaving the house after 1pm and she no longer has a answering machine, so if you miss her try calling the following day  Thanks

## 2015-01-16 ENCOUNTER — Encounter: Payer: Self-pay | Admitting: Cardiovascular Disease

## 2015-01-16 ENCOUNTER — Ambulatory Visit (INDEPENDENT_AMBULATORY_CARE_PROVIDER_SITE_OTHER): Payer: Medicare Other | Admitting: Cardiovascular Disease

## 2015-01-16 VITALS — BP 134/84 | HR 50 | Ht 62.0 in | Wt 113.0 lb

## 2015-01-16 DIAGNOSIS — E785 Hyperlipidemia, unspecified: Secondary | ICD-10-CM | POA: Diagnosis not present

## 2015-01-16 DIAGNOSIS — I351 Nonrheumatic aortic (valve) insufficiency: Secondary | ICD-10-CM

## 2015-01-16 DIAGNOSIS — I2510 Atherosclerotic heart disease of native coronary artery without angina pectoris: Secondary | ICD-10-CM | POA: Diagnosis not present

## 2015-01-16 DIAGNOSIS — I10 Essential (primary) hypertension: Secondary | ICD-10-CM

## 2015-01-16 NOTE — Progress Notes (Signed)
Patient ID: Evelyn [name], female   DOB: Mar 26, 1941, 74 y.o.   MRN: [id number]     Cardiology Office Note   Date:  01/17/2015   ID:  Evelyn [name], DOB 1940/12/03, MRN [id number]  PCP:  Samuel Jester, DO  Cardiologist:   Thurmon Fair, MD   Chief Complaint  Patient presents with  . 6 months    Patient feels SOB.      History of Present Illness: Evelyn [name] is a 74 y.o. female who presents for follow-up for coronary artery disease with remote bypass surgery and aortic insufficiency.  She feels well. She has not had any angina pectoris in well over a month. She had labs performed with Dr. Charm Barges within the last couple of months. She has mild bradycardia but denies any dizziness, fatigue or dyspnea. She has never experienced syncope. She had a couple of episodes of what sounds like expressive aphasia last year, no new events in 2016.  She has long-standing history of coronary disease, undergoing her first cardiac catheterization in 1999. She received stents to the LAD artery in 2002 and had to have cutting balloon atherectomy for in-stent restenosis in 2003 followed by a new LAD stent in 2004 for disease progression. Ultimately after again developing restenosis she underwent coronary bypass surgery (LIMA to LAD, SVG to diagonal, SVG to OM in 2004). She had cardiac catheterization in July 2013. This demonstrated interval total occlusion of the proximal LAD artery although the distant bypasses are all widely patent. Nuclear stress test did not show significant areas of ischemia and left ventricular systolic function was normal. It is felt that she has occasional angina pectoris related to ischemia in the proximal septum since there is no retrograde flow from the bypasses to the proximal LAD. 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 She has treated systemic hypertension and hyperlipidemia and hypothyroidism. She had previous cervical  spine surgery with bone grafting and discectomy in 2009.  In August of 2014 she had carotid duplex ultrasound performed without any visible lesions. At the same time she had an echocardiogram that showed no progression of her aortic insufficiency or significant dilatation of the visible parts of the aorta. LVEF was 55-60%. Diastolic dysfunction was described as grade 1.  Past Medical History  Diagnosis Date  . Hypothyroid   . Dyslipidemia   . CAD (coronary artery disease)     CABG LIMA to LAD,vein graft to diagonal,vein graft to OM  . GERD (gastroesophageal reflux disease)   . Aortic insufficiency     Past Surgical History  Procedure Laterality Date  . Back surgery  2009  . Coronary artery bypass graft  2004    LIMA to LAD,vein graft to diagonal,vein graft to OM  . Coronary angioplasty with stent placement  2002    remote PCI & stenting LAD,  . Coronary angioplasty  2003    cutting balloon atherectomy in-stent stenosis of the LAD.  [name] Nm myocar perf wall motion  12/01/2011    normal study-persistent exercise induced ECG changes raises the concern for ischemia (balanced ischemia).  . Left heart catheterization with coronary angiogram N/A 12/15/2011    Procedure: LEFT HEART CATHETERIZATION WITH CORONARY ANGIOGRAM;  Surgeon: Thurmon Fair, MD;  Location: [facility];  Service: Cardiovascular;  Laterality: N/A;     Current Outpatient Prescriptions  Medication Sig Dispense Refill  . aspirin EC 81 MG tablet Take 81 mg by mouth daily.    [name]  HYDROcodone-acetaminophen (NORCO/VICODIN) 5-325 MG per tablet Take 1 tablet by mouth every 6 (six) hours as needed for moderate pain.    [name] LEVOTHYROXINE SODIUM PO Take 60 mg by mouth daily.    . metoprolol (LOPRESSOR) 100 MG tablet Take 50-100 mg by mouth 2 (two) times daily. Take  AM,  PM    . nitroGLYCERIN (NITROSTAT) 0.4 MG SL tablet Place 0.4 mg under the tongue every 5 (five) minutes as needed for chest pain.    . rosuvastatin (CRESTOR) 20  MG tablet Take 1 tablet (20 mg total) by mouth daily. 30 tablet 6  . Zoledronic Acid (RECLAST IV) Inject into the vein. Once yearly     No current facility-administered medications for this visit.    Allergies:   Sulfa antibiotics    Social History:  The patient  reports that she has never smoked. She has never used smokeless tobacco. She reports that she does not drink alcohol or use illicit drugs.   Family History:  The patient's family history includes Asthma in her maternal grandmother; Heart attack in her mother; Heart failure in her father; Hypertension in her father.    ROS:  Please see the history of present illness.    Otherwise, review of systems positive for none.   All other systems are reviewed and negative.    PHYSICAL EXAM: VS:  BP 134/84 mmHg  Pulse 50  Ht  (1.575 m)  Wt 113 lb (51.256 kg)  BMI 20.66 kg/m2 , BMI Body mass index is 20.66 kg/(m^2).  General: Alert, oriented x3, no distress Head: no evidence of trauma, PERRL, EOMI, no exophtalmos or lid lag, no myxedema, no xanthelasma; normal ears, nose and oropharynx Neck: normal jugular venous pulsations and no hepatojugular reflux; brisk carotid pulses without delay and no carotid bruits Chest: clear to auscultation, no signs of consolidation by percussion or palpation, normal fremitus, symmetrical and full respiratory excursions Cardiovascular: normal position and quality of the apical impulse, regular rhythm, normal first and second heart sounds, no murmurs, rubs or gallops Abdomen: no tenderness or distention, no masses by palpation, no abnormal pulsatility or arterial bruits, normal bowel sounds, no hepatosplenomegaly Extremities: no clubbing, cyanosis or edema; 2+ radial, ulnar and brachial pulses bilaterally; 2+ right femoral, posterior tibial and dorsalis pedis pulses; 2+ left femoral, posterior tibial and dorsalis pedis pulses; no subclavian or femoral bruits Neurological: grossly nonfocal Psych:  euthymic mood, full affect   EKG:  EKG is ordered today. The ekg ordered today demonstrates sinus bradycardia 50 bpm, left atrial abnormality, chronic T-wave inversion in leads V1-V3, QTC 404 ms   Recent Labs: 07/11/2014: ALT 15; BUN 10; Creat 0.59; Potassium 4.5; Sodium 138    Lipid Panel    Component Value Date/Time   CHOL 127 07/11/2014 0913   TRIG 62 07/11/2014 0913   HDL 62 07/11/2014 0913   CHOLHDL 2.0 07/11/2014 0913   VLDL 12 07/11/2014 0913   LDLCALC 53 07/11/2014 0913      Wt Readings from Last 3 Encounters:  01/16/15 113 lb (51.256 kg)  07/11/14 110 lb 3.2 oz (49.986 kg)  11/20/13 107 lb 1.6 oz (48.58 kg)      ASSESSMENT AND PLAN:  CAD, PCI '02, '04. CABG X 3 2004  She has very infrequent episodes of chest discomfort possibly related to ischemia in the territory of the proximal LAD (occluded both upstream and downstream, without direct flow from the left main or from the mammary artery bypass). Her last nuclear stress test  was a low risk study July 2013. She has preserved left ventricular systolic function. She has an abnormal ECG response to exercise.  If chest pain pattern changes, consider imaging for ascending aortic aneurysm.  Aortic insufficiency  Related to a very small aortic aneurysm, described as mild on the most recent echocardiogram, mild to moderate in the past. Not hemodynamically important.   Dyslipidemia  Excellent parameters on statin   HTN (hypertension)  Her blood pressure is good.  Possible TIA (transient ischemic attack)  Presumptive diagnosis,. She had no stenosis on duplex US of the carotid arteries and does not have any known history of atrial tachyarrhythmia. Continue taking aspirin. Improved blood pressure control. 30 day event monitor did not show evidence of atrial fibrillation. Consider clopidogrel if symptoms occur again.   Current medicines are reviewed at length with the patient today.  The patient does not have  concerns regarding medicines.  The following changes have been made:  no change  Labs/ tests ordered today include:  Orders Placed This Encounter  Procedures  . EKG 12-Lead    Patient Instructions  Dr. Royann Shivers recommends that you schedule a follow-up appointment in: ONE YEAR    SignedThurmon Fair, MD  01/17/2015 11:50 AM    Thurmon Fair, MD, [facility] [phone] office [phone] pager

## 2015-01-16 NOTE — Patient Instructions (Signed)
Dr. Croitoru recommends that you schedule a follow-up appointment in: ONE YEAR   

## 2015-03-06 ENCOUNTER — Other Ambulatory Visit: Payer: Self-pay

## 2015-03-06 MED ORDER — ROSUVASTATIN CALCIUM 20 MG PO TABS: 20.0000 mg | ORAL_TABLET | Freq: Every day | ORAL | Status: DC

## 2015-03-06 MED ORDER — METOPROLOL TARTRATE 100 MG PO TABS: 50.0000 mg | ORAL_TABLET | Freq: Two times a day (BID) | ORAL | Status: DC

## 2015-04-22 ENCOUNTER — Telehealth: Payer: Self-pay | Admitting: Cardiovascular Disease

## 2015-04-22 NOTE — Telephone Encounter (Signed)
No answer when dialed. 

## 2015-04-22 NOTE — Telephone Encounter (Signed)
Please call,left leg is swollen a lot.She usually does not have problem with swelling. The Hospice nurse was there to see her husband and told her to call and check on her leg.

## 2015-04-23 NOTE — Telephone Encounter (Signed)
Received a call from patient.Stated she has been having swelling in left lower leg up into left knee.Stated no pain or redness.Stated she was on her feet standing for long periods last week by her husband's bed.Stated hospice has been called in.Stated she has had blood clots in the past but this is different.Stated she does not eat salt.She has a bottle of lasix 20 mg that her husband does not take any longer.She wanted to ask Dr.Croitoru if she can take until the swelling goes down.Advised I will send message to him for advice.

## 2015-04-23 NOTE — Telephone Encounter (Signed)
Returning call from yesterday. °

## 2015-04-23 NOTE — Telephone Encounter (Signed)
Returned call to patient.Dr.Croitoru advised needs venous doppler of left leg.Advised schedulers will call back tomorrow 12/8 to schedule.

## 2015-04-23 NOTE — Telephone Encounter (Signed)
Asymmetrical /unilateral leg swelling is very concerning for DVT, especially in someone was had a DVT in the past. I am afraid that Lasix might not solve this problem. Recommend that she have a duplex ultrasound to exclude DVT as soon as possible

## 2015-04-23 NOTE — Telephone Encounter (Signed)
Returned call to patient no answer.No voice mail unable to leave a message. 

## 2015-04-24 ENCOUNTER — Other Ambulatory Visit: Payer: Self-pay | Admitting: Cardiovascular Disease

## 2015-04-24 ENCOUNTER — Ambulatory Visit (HOSPITAL_COMMUNITY)
Admission: RE | Admit: 2015-04-24 | Discharge: 2015-04-24 | Disposition: A | Discharge status: Home / Self Care | Payer: Medicare Other | Source: Ambulatory Visit | Attending: Cardiovascular Disease | Admitting: Cardiovascular Disease

## 2015-04-24 DIAGNOSIS — I10 Essential (primary) hypertension: Secondary | ICD-10-CM | POA: Insufficient documentation

## 2015-04-24 DIAGNOSIS — M7989 Other specified soft tissue disorders: Secondary | ICD-10-CM | POA: Diagnosis not present

## 2015-04-24 DIAGNOSIS — E785 Hyperlipidemia, unspecified: Secondary | ICD-10-CM | POA: Insufficient documentation

## 2015-04-24 NOTE — Telephone Encounter (Signed)
Rx(s) sent to pharmacy electronically.  

## 2015-04-24 NOTE — Telephone Encounter (Signed)
LE venous dopplers scheduled 12/8 at 4:30 pm.

## 2015-04-25 ENCOUNTER — Telehealth: Payer: Self-pay | Admitting: *Deleted

## 2015-04-25 MED ORDER — FUROSEMIDE 20 MG PO TABS: 20.0000 mg | ORAL_TABLET | Freq: Every day | ORAL | Status: DC

## 2015-04-25 NOTE — Progress Notes (Signed)
[facility]25[facility]15 2:16pm

## 2015-04-25 NOTE — Telephone Encounter (Signed)
[facility] for lab results  

## 2015-04-25 NOTE — Telephone Encounter (Signed)
LEA results called to patient.  Instructed to start Furosemide 20mg  daily and eat a high potassium diet.  Patient has this med at home from her husband who is at home on hospice.  Will eat potassium rich foods.

## 2015-04-25 NOTE — Telephone Encounter (Signed)
-----   Message from Mihai Croitoru, MD sent at 04/25/2015 10:35 AM EST ----- No DVT. Please prescribe furosemide 20 mg daily and recommend that she eat potassium rich foods. 

## 2015-04-25 NOTE — Telephone Encounter (Signed)
-----   Message from Thurmon FairMihai Croitoru, MD sent at 04/25/2015 10:35 AM EST ----- No DVT. Please prescribe furosemide 20 mg daily and recommend that she eat potassium rich foods.

## 2015-11-04 DIAGNOSIS — Z0000 Encounter for general adult medical examination without abnormal findings: Secondary | ICD-10-CM | POA: Diagnosis not present

## 2015-11-04 DIAGNOSIS — E782 Mixed hyperlipidemia: Secondary | ICD-10-CM | POA: Diagnosis not present

## 2015-11-04 DIAGNOSIS — I2510 Atherosclerotic heart disease of native coronary artery without angina pectoris: Secondary | ICD-10-CM | POA: Diagnosis not present

## 2015-11-04 DIAGNOSIS — Z6820 Body mass index (BMI) 20.0-20.9, adult: Secondary | ICD-10-CM | POA: Diagnosis not present

## 2016-01-06 ENCOUNTER — Ambulatory Visit (INDEPENDENT_AMBULATORY_CARE_PROVIDER_SITE_OTHER): Payer: Medicare HMO

## 2016-01-06 ENCOUNTER — Ambulatory Visit (INDEPENDENT_AMBULATORY_CARE_PROVIDER_SITE_OTHER): Payer: Medicare HMO | Admitting: Physician Assistant

## 2016-01-06 ENCOUNTER — Encounter: Payer: Self-pay | Admitting: Physician Assistant

## 2016-01-06 VITALS — BP 129/70 | Temp 97.0°F | Ht 62.0 in | Wt 113.4 lb

## 2016-01-06 DIAGNOSIS — M47812 Spondylosis without myelopathy or radiculopathy, cervical region: Secondary | ICD-10-CM | POA: Diagnosis not present

## 2016-01-06 DIAGNOSIS — R29898 Other symptoms and signs involving the musculoskeletal system: Secondary | ICD-10-CM

## 2016-01-06 DIAGNOSIS — M25511 Pain in right shoulder: Secondary | ICD-10-CM | POA: Diagnosis not present

## 2016-01-06 DIAGNOSIS — M810 Age-related osteoporosis without current pathological fracture: Secondary | ICD-10-CM

## 2016-01-06 NOTE — Progress Notes (Addendum)
BP 129/70 (BP Location: Left Arm, Patient Position: Sitting, Cuff Size: Normal)   Temp 97 F (36.1 C) (Oral)   Ht 5\' 2"  (1.575 m)   Wt 113 lb 6.4 oz (51.4 kg)   SpO2 95%   BMI 20.74 kg/m    Subjective:    Patient ID: Evelyn [name], female    DOB: [birth year], 75 y.o.   MRN: [id number]  HPI: Evelyn LacyShirley H [name] is a 75 y.o. female presenting on 01/06/2016 for right shoulder pain; Right arm pain; and Neck Pain   HPI Patient here to be established as new patient at [facility].  This patient is [facility] to [facility].  Had pain in the right shoulder that radiates down arm to hand and up to spine.  Most painful over top and anterior shoulder and in the cervical prominences. Known DDD and ruptured discs that [facility]. Had been quite stable. Denies any injury to the area. Some weakness in the right arm and hand, hard to carry bags when shopping.  She has had Reclast treatment for about 4 years for her osteoporosis and is due a DEXA scan.  Order completed for this.  Relevant past medical, surgical, family and social history reviewed and updated as indicated. Interim medical history since our last visit reviewed. Allergies and medications reviewed and updated.  Review of Systems  Constitutional: Negative.  Negative for activity change, fatigue and fever.  HENT: Negative.   Eyes: Negative.   Respiratory: Negative.  Negative for cough.   Cardiovascular: Negative.  Negative for chest pain.  Gastrointestinal: Negative.  Negative for abdominal pain.  Endocrine: Negative.   Genitourinary: Negative.  Negative for dysuria.  Musculoskeletal: Positive for arthralgias, back pain, joint swelling, myalgias, neck pain and neck stiffness.  Skin: Negative.   Neurological: Negative.   Psychiatric/Behavioral: Negative.     Per HPI unless specifically indicated above  Social History   Social History  . Marital status:  Married    Spouse name: N/A  . Number of children: N/A  . Years of education: N/A   Occupational History  . Not on file.   Social History Main Topics  . Smoking status: Never Smoker  . Smokeless tobacco: Never Used  . Alcohol use No  . Drug use: No  . Sexual activity: Not on file   Other Topics Concern  . Not on file   Social History Narrative   Mother of 2 with 4 grandchildren    Past Surgical History:  Procedure Laterality Date  . BACK SURGERY  2009  . CORONARY ANGIOPLASTY  2003   cutting balloon atherectomy in-stent stenosis of the LAD.  [name]. CORONARY ANGIOPLASTY WITH STENT PLACEMENT  2002   remote PCI & stenting LAD,  . CORONARY ARTERY BYPASS GRAFT  2004   LIMA to LAD,vein graft to diagonal,vein graft to OM  . LEFT HEART CATHETERIZATION WITH CORONARY ANGIOGRAM N/A 12/15/2011   Procedure: LEFT HEART CATHETERIZATION WITH CORONARY ANGIOGRAM;  Surgeon: Thurmon FairMihai Croitoru, MD;  Location: [facility] CATH [facility];  Service: Cardiovascular;  Laterality: N/A;  . NM MYOCAR PERF WALL MOTION  12/01/2011   normal study-persistent exercise induced ECG changes raises the concern for ischemia (balanced ischemia).    Family History  Problem Relation Age of Onset  . Heart attack Mother   . Heart failure Father   . Hypertension Father   . Asthma Maternal Grandmother       Medication List  Accurate as of 01/06/16  5:01 PM. Always use your most recent med list.          ALPRAZolam 0.25 MG tablet Commonly known as:  XANAX Take 0.25 mg by mouth 3 (three) times daily as needed.   aspirin EC 81 MG tablet Take 81 mg by mouth daily.   furosemide 20 MG tablet Commonly known as:  LASIX Take 1 tablet (20 mg total) by mouth daily.   HYDROcodone-acetaminophen 5-325 MG tablet Commonly known as:  NORCO/VICODIN Take 1 tablet by mouth every 6 (six) hours as needed for moderate pain.   levothyroxine 88 MCG tablet Commonly known as:  SYNTHROID, LEVOTHROID Take 88 mcg by mouth daily before  breakfast.   metoprolol 100 MG tablet Commonly known as:  LOPRESSOR Take 1 tablet (100 mg) in  the morning and take 1/2  tablet (50 mg) in the  evening   nitroGLYCERIN 0.4 MG SL tablet Commonly known as:  NITROSTAT Place 0.4 mg under the tongue every 5 (five) minutes as needed for chest pain.   RECLAST IV Inject into the vein. Once yearly   rosuvastatin 20 MG tablet Commonly known as:  CRESTOR Take 1 tablet (20 mg total) by mouth daily.          Objective:    BP 129/70 (BP Location: Left Arm, Patient Position: Sitting, Cuff Size: Normal)   Temp 97 F (36.1 C) (Oral)   Ht 5\' 2"  (1.575 m)   Wt 113 lb 6.4 oz (51.4 kg)   SpO2 95%   BMI 20.74 kg/m   Wt Readings from Last 3 Encounters:  01/06/16 113 lb 6.4 oz (51.4 kg)  01/16/15 113 lb (51.3 kg)  07/11/14 110 lb 3.2 oz (50 kg)    Physical Exam  Constitutional: She is oriented to person, place, and time. She appears well-developed and well-nourished.  HENT:  Head: Normocephalic and atraumatic.  Eyes: Conjunctivae and EOM are normal. Pupils are equal, round, and reactive to light.  Neck: Normal range of motion. Neck supple.  Cardiovascular: Normal rate, regular rhythm, normal heart sounds and intact distal pulses.   Pulmonary/Chest: Effort normal and breath sounds normal.  Abdominal: Soft. Bowel sounds are normal.  Musculoskeletal: She exhibits tenderness.       Right shoulder: She exhibits decreased range of motion, tenderness and bony tenderness. She exhibits no swelling, no effusion and no crepitus.       Cervical back: She exhibits decreased range of motion, tenderness, deformity, pain and spasm.  Neurological: She is alert and oriented to person, place, and time. She has normal reflexes.  Skin: Skin is warm and dry. No rash noted.  Psychiatric: She has a normal mood and affect. Her behavior is normal. Judgment and thought content normal.  Nursing note and vitals reviewed.       Assessment & Plan:   1. Cervical  spine degeneration  - DG Cervical Spine Complete; Future  2. Pain in joint of right shoulder  - DG Shoulder Right; Future  3. Weakness of right arm - DG Cervical Spine Complete; Future - DG Shoulder Right; Future  4. Osteoporosis - DG [facility] DEXA   Await xray results and refer to ortho or neurosurgeon [facility]. Plan osteoporosis treatment after DEXA. Continue all current meds as listed above. Follow up plan: Return in about 3 months (around 04/07/2016).  Arville Care, MD [facility] 01/06/2016, 5:01 PM     Patient's shoulder and cervical spine images: Preliminarly read by Remus Loffler,  PA-C Final report from radiology reviewed.

## 2016-01-06 NOTE — Patient Instructions (Signed)
osteopo Osteoporosis Osteoporosis is the thinning and loss of density in the bones. Osteoporosis makes the bones more brittle, fragile, and likely to break (fracture). Over time, osteoporosis can cause the bones to become so weak that they fracture after a simple fall. The bones most likely to fracture are the bones in the hip, wrist, and spine. CAUSES  The exact cause is not known. RISK FACTORS Anyone can develop osteoporosis. You may be at greater risk if you have a family history of the condition or have poor nutrition. You may also have a higher risk if you are:   Female.   77 years old or older.  A smoker.  Not physically active.   White or Asian.  Slender. SIGNS AND SYMPTOMS  A fracture might be the first sign of the disease, especially if it results from a fall or injury that would not usually cause a bone to break. Other signs and symptoms include:   Low back and neck pain.  Stooped posture.  Height loss. DIAGNOSIS  To make a diagnosis, your health care provider may:  Take a medical history.  Perform a physical exam.  Order tests, such as:  A bone mineral density test.  A dual-energy X-ray absorptiometry test. TREATMENT  The goal of osteoporosis treatment is to strengthen your bones to reduce your risk of a fracture. Treatment may involve:  Making lifestyle changes, such as:  Eating a diet rich in calcium.  Doing weight-bearing and muscle-strengthening exercises.  Stopping tobacco use.  Limiting alcohol intake.  Taking medicine to slow the process of bone loss or to increase bone density.  Monitoring your levels of calcium and vitamin D. HOME CARE INSTRUCTIONS  Include calcium and vitamin D in your diet. Calcium is important for bone health, and vitamin D helps the body absorb calcium.  Perform weight-bearing and muscle-strengthening exercises as directed by your health care provider.  Do not use any tobacco products, including cigarettes,  chewing tobacco, and electronic cigarettes. If you need help quitting, ask your health care provider.  Limit your alcohol intake.  Take medicines only as directed by your health care provider.  Keep all follow-up visits as directed by your health care provider. This is important.  Take precautions at home to lower your risk of falling, such as:  Keeping rooms well lit and clutter free.  Installing safety rails on stairs.  Using rubber mats in the bathroom and other areas that are often wet or slippery. SEEK IMMEDIATE MEDICAL CARE IF:  You fall or injure yourself.    This information is not intended to replace advice given to you by your health care provider. Make sure you discuss any questions you have with your health care provider.   Document Released: 02/10/2005 Document Revised: 05/24/2014 Document Reviewed: 10/11/2013 Elsevier Interactive Patient Education [employer]2016 Elsevier Inc.

## 2016-01-07 ENCOUNTER — Ambulatory Visit (INDEPENDENT_AMBULATORY_CARE_PROVIDER_SITE_OTHER): Payer: Medicare HMO

## 2016-01-07 DIAGNOSIS — M810 Age-related osteoporosis without current pathological fracture: Secondary | ICD-10-CM

## 2016-01-16 ENCOUNTER — Encounter: Payer: Self-pay | Admitting: Cardiovascular Disease

## 2016-01-16 ENCOUNTER — Ambulatory Visit (INDEPENDENT_AMBULATORY_CARE_PROVIDER_SITE_OTHER): Payer: Medicare HMO | Admitting: Cardiovascular Disease

## 2016-01-16 VITALS — BP 110/76 | HR 72 | Ht 62.0 in | Wt 113.6 lb

## 2016-01-16 DIAGNOSIS — R0602 Shortness of breath: Secondary | ICD-10-CM | POA: Diagnosis not present

## 2016-01-16 DIAGNOSIS — I712 Thoracic aortic aneurysm, without rupture: Secondary | ICD-10-CM

## 2016-01-16 DIAGNOSIS — I351 Nonrheumatic aortic (valve) insufficiency: Secondary | ICD-10-CM

## 2016-01-16 DIAGNOSIS — I25728 Atherosclerosis of autologous artery coronary artery bypass graft(s) with other forms of angina pectoris: Secondary | ICD-10-CM | POA: Diagnosis not present

## 2016-01-16 DIAGNOSIS — E785 Hyperlipidemia, unspecified: Secondary | ICD-10-CM

## 2016-01-16 DIAGNOSIS — I7121 Aneurysm of the ascending aorta, without rupture: Secondary | ICD-10-CM

## 2016-01-16 MED ORDER — NITROGLYCERIN 0.4 MG SL SUBL: 0.4000 mg | SUBLINGUAL_TABLET | SUBLINGUAL | 3 refills | Status: DC | PRN

## 2016-01-16 NOTE — Patient Instructions (Signed)
Medication Instructions: Dr Royann Shiversroitoru recommends that you continue on your current medications as directed. Please refer to the Current Medication list given to you today.  Labwork: NONE ORDERED  Testing/Procedures: 1. Echocardiogrm - Your physician has requested that you have an echocardiogram. Echocardiography is a painless test that uses sound waves to create images of your heart. It provides your doctor with information about the size and shape of your heart and how well your heart's chambers and valves are working. This procedure takes approximately one hour. There are no restrictions for this procedure.  2. Exercise Myoview Stress Test - Your physician has requested that you have en exercise stress myoview. For further information please visit [email or web address] Please follow instruction sheet, as given.  **Please schedule both tests to be done at our [facility] location ([address], [address]) on the same day!!  Follow-up: Dr Royann Shiversroitoru recommends that you schedule a follow-up appointment after your tests.  If you need a refill on your cardiac medications before your next appointment, please call your pharmacy.

## 2016-01-16 NOTE — Progress Notes (Signed)
Cardiology Office Note    Date:  01/17/2016   ID:  Evelyn [name], DOB [birth year], MRN [id number]  PCP:  Evelyn LofflerAngel S Jones, PA  Cardiologist:   Evelyn FairMihai Sharmane Dame, MD   Chief Complaint  Patient presents with  . Follow-up    pt c/o sob is getting worse    History of Present Illness:  Evelyn [name] is a 75 y.o. female with long-standing history of coronary artery disease with previous stents to LAD followed by bypass surgery (LIMA to LAD, SVG to diagonal, SVG to OM in 2004), hyperlipidemia here for follow-up.  Unfortunately, her husband of 60 years passed away just last December. She is still mourning his passing. She has had daily episodes of shortness of breath when she walks the dog if she tries to walk too fast. She also describes interscapular pain that radiates forward to the retrosternal area and a sits some dyspnea. Mostly this happens with activity but it has also happened at rest. Usually the episodes are quite brief lasting no more than a minute or so. She does not have nitroglycerin. She is taking aspirin and a statin as well as a relatively high dose of beta blocker. She denies palpitations, syncope, leg edema, claudication or focal neurological deficits. She mains very slender, actually with a BMI just under 21.  She has long-standing history of coronary disease, undergoing her first cardiac catheterization in 1999. She received stents to the LAD artery in 2002 and had to have cutting balloon atherectomy for in-stent restenosis in 2003 followed by a new LAD stent in 2004 for disease progression. Ultimately after again developing restenosis she underwent coronary bypass surgery (LIMA to LAD, SVG to diagonal, SVG to OM in 2004). She had cardiac catheterization in July 2013. This demonstrated interval total occlusion of the proximal LAD artery although the distant bypasses are all widely patent. Nuclear stress test did not show significant areas of ischemia and left ventricular systolic  function was normal. It is felt that she has occasional angina pectoris related to ischemia in the proximal septum since there is no retrograde flow from the bypasses to the proximal LAD. 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 She has treated systemic hypertension and hyperlipidemia and hypothyroidism. She had previous cervical spine surgery with bone grafting and discectomy in 2009.  In August of 2014 she had carotid duplex ultrasound performed without any visible lesions. At the same time she had an echocardiogram that showed no progression of her aortic insufficiency or significant dilatation of the visible parts of the aorta. LVEF was 55-60%. Diastolic dysfunction was described as grade 1. Her stress test in 2013 she had angina and an abnormal ECG response consistent with ischemia, but the perfusion images were "normal".  Past Medical History:  Diagnosis Date  . Aortic insufficiency   . CAD (coronary artery disease)    CABG LIMA to LAD,vein graft to diagonal,vein graft to OM  . Dyslipidemia   . GERD (gastroesophageal reflux disease)   . Hypothyroid     Past Surgical History:  Procedure Laterality Date  . BACK SURGERY  2009  . CORONARY ANGIOPLASTY  2003   cutting balloon atherectomy in-stent stenosis of the LAD.  [name]. CORONARY ANGIOPLASTY WITH STENT PLACEMENT  2002   remote PCI & stenting LAD,  . CORONARY ARTERY BYPASS GRAFT  2004   LIMA to LAD,vein graft to diagonal,vein graft to OM  . LEFT HEART CATHETERIZATION WITH CORONARY ANGIOGRAM  N/A 12/15/2011   Procedure: LEFT HEART CATHETERIZATION WITH CORONARY ANGIOGRAM;  Surgeon: Evelyn Fair, MD;  Location: [facility] CATH LAB;  Service: Cardiovascular;  Laterality: N/A;  . NM MYOCAR PERF WALL MOTION  12/01/2011   normal study-persistent exercise induced ECG changes raises the concern for ischemia (balanced ischemia).    Current Medications: Outpatient Medications Prior to Visit  Medication  Sig Dispense Refill  . ALPRAZolam (XANAX) 0.25 MG tablet Take 0.25 mg by mouth 3 (three) times daily as needed.     [name] aspirin EC 81 MG tablet Take 81 mg by mouth daily.    . furosemide (LASIX) 20 MG tablet Take 1 tablet (20 mg total) by mouth daily. 90 tablet 3  . HYDROcodone-acetaminophen (NORCO/VICODIN) 5-325 MG per tablet Take 1 tablet by mouth every 6 (six) hours as needed for moderate pain.    [name] levothyroxine (SYNTHROID, LEVOTHROID) 88 MCG tablet Take 88 mcg by mouth daily before breakfast.     . metoprolol (LOPRESSOR) 100 MG tablet Take 1 tablet (100 mg) in  the morning and take 1/2  tablet (50 mg) in the  evening 135 tablet 9  . rosuvastatin (CRESTOR) 20 MG tablet Take 1 tablet (20 mg total) by mouth daily. 30 tablet 6  . Zoledronic Acid (RECLAST IV) Inject into the vein. Once yearly    . nitroGLYCERIN (NITROSTAT) 0.4 MG SL tablet Place 0.4 mg under the tongue every 5 (five) minutes as needed for chest pain.     No facility-administered medications prior to visit.      Allergies:   Sulfa antibiotics   Social History   Social History  . Marital status: Married    Spouse name: N/A  . Number of children: N/A  . Years of education: N/A   Social History Main Topics  . Smoking status: Never Smoker  . Smokeless tobacco: Never Used  . Alcohol use No  . Drug use: No  . Sexual activity: Not Asked   Other Topics Concern  . None   Social History Narrative   Mother of 2 with 4 grandchildren     Family History:  The patient's family history includes Asthma in her maternal grandmother; Heart attack in her mother; Heart failure in her father; Hypertension in her father.   ROS:   Please see the history of present illness.    ROS All other systems reviewed and are negative.   PHYSICAL EXAM:   VS:  BP 110/76 (BP Location: Right Arm, Patient Position: Sitting, Cuff Size: Normal)   Pulse 72   Ht 5\' 2"  (1.575 m)   Wt 113 lb 9.6 oz (51.5 kg)   SpO2 91%   BMI 20.78 kg/m    GEN:  Well nourished, well developed, in no acute distress  HEENT: normal  Neck: no JVD, carotid bruits, or masses Cardiac: RRR; no murmurs, rubs, or gallops,no edema  Respiratory:  clear to auscultation bilaterally, normal work of breathing GI: soft, nontender, nondistended, + BS MS: no deformity or atrophy  Skin: warm and dry, no rash Neuro:  Alert and Oriented x 3, Strength and sensation are intact Psych: euthymic mood, full affect  Wt Readings from Last 3 Encounters:  01/16/16 113 lb 9.6 oz (51.5 kg)  01/06/16 113 lb 6.4 oz (51.4 kg)  01/16/15 113 lb (51.3 kg)      Studies/Labs Reviewed:   EKG:  EKG is ordered today.  The ekg ordered today demonstrates Normal sinus rhythm, T-wave inversion in leads V1-V3 (old), QTC 440 ms  Recent Labs: No results found for requested labs within last 8760 hours.   Lipid Panel    Component Value Date/Time   CHOL 127 07/11/2014 0913   TRIG 62 07/11/2014 0913   HDL 62 07/11/2014 0913   CHOLHDL 2.0 07/11/2014 0913   VLDL 12 07/11/2014 0913   LDLCALC 53 07/11/2014 0913   She had labs with her PCP Prudy Feeler in June but these are not currently available for review  ASSESSMENT:    1. Coronary artery disease involving autologous artery coronary bypass graft with other forms of angina pectoris (HCC)   2. Dyslipidemia   3. Aortic insufficiency   4. Ascending aortic aneurysm (HCC)   5. Shortness of breath      PLAN:  In order of problems listed above:  1. CAD s/p CABG: She continues to have exertional angina pectoris and this appears to be more frequent than a year ago and associated shortness of breath. Will repeat her echo and stress Myoview studies. 2. HLP: On statin, retrieve the most recent labs from PCP 3. AI: Unable to hear murmur on physical exam, reevaluated by echo 4. Asc Ao Aneurysm: Small by previous evaluation, will remeasure on echo. Consider reimaging with CT angiogram of the chest, especially for chest pain syndrome remains of  unclear etiology.    Medication Adjustments/Labs and Tests Ordered: Current medicines are reviewed at length with the patient today.  Concerns regarding medicines are outlined above.  Medication changes, Labs and Tests ordered today are listed in the Patient Instructions below. Patient Instructions  Medication Instructions: Dr Royann Shivers recommends that you continue on your current medications as directed. Please refer to the Current Medication list given to you today.  Labwork: NONE ORDERED  Testing/Procedures: 1. Echocardiogrm - Your physician has requested that you have an echocardiogram. Echocardiography is a painless test that uses sound waves to create images of your heart. It provides your doctor with information about the size and shape of your heart and how well your heart's chambers and valves are working. This procedure takes approximately one hour. There are no restrictions for this procedure.  2. Exercise Myoview Stress Test - Your physician has requested that you have en exercise stress myoview. For further information please visit [email or web address] Please follow instruction sheet, as given.  **Please schedule both tests to be done at our [facility] location ([address], [address]) on the same day!!  Follow-up: Dr Royann Shivers recommends that you schedule a follow-up appointment after your tests.  If you need a refill on your cardiac medications before your next appointment, please call your pharmacy.    Signed, Evelyn Fair, MD  01/17/2016 1:57 PM    [facility] [address] [place], [place], [place]  [address] Phone: [phone]; Fax: [phone]

## 2016-01-17 DIAGNOSIS — I7121 Aneurysm of the ascending aorta, without rupture: Secondary | ICD-10-CM | POA: Insufficient documentation

## 2016-01-17 DIAGNOSIS — I712 Thoracic aortic aneurysm, without rupture: Secondary | ICD-10-CM | POA: Insufficient documentation

## 2016-01-20 ENCOUNTER — Telehealth: Payer: Self-pay | Admitting: Physician Assistant

## 2016-01-20 NOTE — Telephone Encounter (Signed)
Patient called stating that she needed a referral for reclast and would like to know if she is continue taking lasix

## 2016-01-21 ENCOUNTER — Telehealth: Payer: Self-pay | Admitting: Physician Assistant

## 2016-01-21 ENCOUNTER — Ambulatory Visit (HOSPITAL_COMMUNITY): Payer: Medicare HMO | Attending: Internal Medicine

## 2016-01-21 ENCOUNTER — Other Ambulatory Visit: Payer: Self-pay

## 2016-01-21 DIAGNOSIS — I340 Nonrheumatic mitral (valve) insufficiency: Secondary | ICD-10-CM | POA: Insufficient documentation

## 2016-01-21 DIAGNOSIS — I071 Rheumatic tricuspid insufficiency: Secondary | ICD-10-CM | POA: Diagnosis not present

## 2016-01-21 DIAGNOSIS — R0602 Shortness of breath: Secondary | ICD-10-CM | POA: Insufficient documentation

## 2016-01-21 DIAGNOSIS — I358 Other nonrheumatic aortic valve disorders: Secondary | ICD-10-CM | POA: Insufficient documentation

## 2016-01-21 DIAGNOSIS — I351 Nonrheumatic aortic (valve) insufficiency: Secondary | ICD-10-CM | POA: Insufficient documentation

## 2016-01-21 DIAGNOSIS — R079 Chest pain, unspecified: Secondary | ICD-10-CM | POA: Diagnosis present

## 2016-01-21 MED ORDER — FUROSEMIDE 20 MG PO TABS: 20.0000 mg | ORAL_TABLET | Freq: Every day | ORAL | 3 refills | Status: DC

## 2016-01-21 NOTE — Telephone Encounter (Signed)
Prescription sent to pharmacy.

## 2016-01-21 NOTE — Telephone Encounter (Signed)
In the last note I had said she should continue and then she can take 1/2-1 daily depending on the edema.

## 2016-01-21 NOTE — Telephone Encounter (Signed)
Patient aware and verbalizes understanding. 

## 2016-01-21 NOTE — Telephone Encounter (Signed)
noted 

## 2016-01-21 NOTE — Telephone Encounter (Signed)
Tried to contact patient. No answer/ no VM. 

## 2016-01-21 NOTE — Telephone Encounter (Signed)
Do you want patient to continue on lasix? Please advice and send in if approved.

## 2016-01-21 NOTE — Telephone Encounter (Signed)
Continue lasix for now, may reduce 1/2 tab if she wants and go back up if she needs to.  I am still waiting for the final DEXA report.  Please let her know that it takes a while for that to get back and as soon as I can I will refer for Reclast.  Where do you all refer for Reclast infusion?

## 2016-01-21 NOTE — Telephone Encounter (Signed)
sent 

## 2016-01-26 ENCOUNTER — Encounter: Payer: Self-pay | Admitting: *Deleted

## 2016-01-26 NOTE — Progress Notes (Signed)
Will this result ever be in?

## 2016-01-27 ENCOUNTER — Other Ambulatory Visit: Payer: Self-pay | Admitting: *Deleted

## 2016-01-28 ENCOUNTER — Telehealth (HOSPITAL_COMMUNITY): Payer: Self-pay

## 2016-01-28 NOTE — Telephone Encounter (Signed)
I will attempt again at a later time. 

## 2016-01-29 ENCOUNTER — Telehealth (HOSPITAL_COMMUNITY): Payer: Self-pay

## 2016-01-29 NOTE — Telephone Encounter (Signed)
Encounter complete. 

## 2016-01-30 ENCOUNTER — Ambulatory Visit (HOSPITAL_COMMUNITY)
Admission: RE | Admit: 2016-01-30 | Discharge: 2016-01-30 | Disposition: A | Discharge status: Home / Self Care | Payer: Medicare HMO | Source: Ambulatory Visit | Attending: Cardiology | Admitting: Cardiology

## 2016-01-30 DIAGNOSIS — R9439 Abnormal result of other cardiovascular function study: Secondary | ICD-10-CM | POA: Insufficient documentation

## 2016-01-30 DIAGNOSIS — R0602 Shortness of breath: Secondary | ICD-10-CM

## 2016-01-30 MED ORDER — TECHNETIUM TC 99M TETROFOSMIN IV KIT
31.5000 | PACK | Freq: Once | INTRAVENOUS | Status: AC | PRN
  Administered 2016-01-30: 32 via INTRAVENOUS
  Filled 2016-01-30: qty 32

## 2016-01-30 MED ORDER — TECHNETIUM TC 99M TETROFOSMIN IV KIT
10.6000 | PACK | Freq: Once | INTRAVENOUS | Status: AC | PRN
  Administered 2016-01-30: 11 via INTRAVENOUS
  Filled 2016-01-30: qty 11

## 2016-02-03 ENCOUNTER — Telehealth: Payer: Self-pay | Admitting: Physician Assistant

## 2016-02-03 LAB — MYOCARDIAL PERFUSION IMAGING
CHL CUP NUCLEAR SDS: 2
CHL CUP NUCLEAR SRS: 1
CSEPEDS: 34 s
CSEPPHR: 130 {beats}/min
Estimated workload: 5.4 METS
Exercise duration (min): 4 min
LVDIAVOL: 60 mL (ref 46–106)
LVSYSVOL: 23 mL
MPHR: 143 {beats}/min
Percent HR: 90 %
RPE: 17
Rest HR: 64 {beats}/min
SSS: 3
TID: 1.18

## 2016-02-03 NOTE — Telephone Encounter (Signed)
Spoke with patient.

## 2016-02-05 ENCOUNTER — Other Ambulatory Visit: Payer: Medicare HMO

## 2016-02-05 DIAGNOSIS — I10 Essential (primary) hypertension: Secondary | ICD-10-CM | POA: Diagnosis not present

## 2016-02-06 ENCOUNTER — Telehealth: Payer: Self-pay | Admitting: Physician Assistant

## 2016-02-06 LAB — CMP14+EGFR
ALBUMIN: 4 g/dL (ref 3.5–4.8)
ALK PHOS: 56 IU/L (ref 39–117)
ALT: 10 IU/L (ref 0–32)
AST: 13 IU/L (ref 0–40)
Albumin/Globulin Ratio: 1.7 (ref 1.2–2.2)
BUN / CREAT RATIO: 12 (ref 12–28)
BUN: 7 mg/dL — AB (ref 8–27)
Bilirubin Total: 0.5 mg/dL (ref 0.0–1.2)
CALCIUM: 8.7 mg/dL (ref 8.7–10.3)
CO2: 24 mmol/L (ref 18–29)
CREATININE: 0.6 mg/dL (ref 0.57–1.00)
Chloride: 100 mmol/L (ref 96–106)
GFR calc Af Amer: 102 mL/min/{1.73_m2} (ref >59)
GFR, EST NON AFRICAN AMERICAN: 88 mL/min/{1.73_m2} (ref >59)
GLUCOSE: 86 mg/dL (ref 65–99)
Globulin, Total: 2.3 g/dL (ref 1.5–4.5)
Potassium: 3.9 mmol/L (ref 3.5–5.2)
Sodium: 139 mmol/L (ref 134–144)
Total Protein: 6.3 g/dL (ref 6.0–8.5)

## 2016-02-06 NOTE — Telephone Encounter (Signed)
Can you see what is going on?

## 2016-02-06 NOTE — Telephone Encounter (Signed)
re-faxed

## 2016-02-16 ENCOUNTER — Encounter: Payer: Self-pay | Admitting: Cardiovascular Disease

## 2016-02-16 ENCOUNTER — Ambulatory Visit (INDEPENDENT_AMBULATORY_CARE_PROVIDER_SITE_OTHER): Payer: Medicare HMO | Admitting: Cardiovascular Disease

## 2016-02-16 VITALS — BP 142/67 | HR 54 | Resp 11 | Ht 62.0 in | Wt 115.2 lb

## 2016-02-16 DIAGNOSIS — I351 Nonrheumatic aortic (valve) insufficiency: Secondary | ICD-10-CM

## 2016-02-16 DIAGNOSIS — E785 Hyperlipidemia, unspecified: Secondary | ICD-10-CM | POA: Diagnosis not present

## 2016-02-16 DIAGNOSIS — I7121 Aneurysm of the ascending aorta, without rupture: Secondary | ICD-10-CM

## 2016-02-16 DIAGNOSIS — I712 Thoracic aortic aneurysm, without rupture: Secondary | ICD-10-CM | POA: Diagnosis not present

## 2016-02-16 DIAGNOSIS — I25728 Atherosclerosis of autologous artery coronary artery bypass graft(s) with other forms of angina pectoris: Secondary | ICD-10-CM

## 2016-02-16 MED ORDER — FUROSEMIDE 20 MG PO TABS: 20.0000 mg | ORAL_TABLET | Freq: Every day | ORAL | 3 refills | Status: DC

## 2016-02-16 NOTE — Progress Notes (Signed)
Cardiology Office Note    Date:  02/16/2016   ID:  Evelyn [name], DOB 1940-08-01, MRN [id number]  PCP:  Remus LofflerAngel S Jones, PA  Cardiologist:   Thurmon FairMihai Reece Fehnel, MD   Chief Complaint  Patient presents with  . Follow-up    after echo; sob; no changes.cramping in legs and arms.     History of Present Illness:  Evelyn [name] is a 75 y.o. female with long-standing history of coronary artery disease with previous stents to LAD followed by bypass surgery (LIMA to LAD, SVG to diagonal, SVG to OM in 2004), hyperlipidemia here for follow-upAfter undergoing an echocardiogram and nuclear stress test. Both studies showed benign findings with preserved left ventricular ejection fraction and no perfusion abnormalities or regional wall motion abnormalities  She seems less troubled by her dyspnea. She is minimizing her chest discomfort compared with her last appointment. She has not needed to take nitroglycerin. She seems more energetic. She denies palpitations, syncope, leg edema, claudication or focal neurological deficits. She remains very slender, actually with a BMI just under 21.  She has long-standing history of coronary disease, undergoing her first cardiac catheterization in 1999. She received stents to the LAD artery in 2002 and had to have cutting balloon atherectomy for in-stent restenosis in 2003 followed by a new LAD stent in 2004 for disease progression. Ultimately after again developing restenosis she underwent coronary bypass surgery (LIMA to LAD, SVG to diagonal, SVG to OM in 2004). She had cardiac catheterization in July 2013. This demonstrated interval total occlusion of the proximal LAD artery although the distant bypasses are all widely patent. Nuclear stress test did not show significant areas of ischemia and left ventricular systolic function was normal. It is felt that she has occasional angina pectoris related to ischemia in the proximal septum since there is no retrograde flow from  the bypasses to the proximal LAD. 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 She has treated systemic hypertension and hyperlipidemia and hypothyroidism. She had previous cervical spine surgery with bone grafting and discectomy in 2009.  In August of 2014 she had carotid duplex ultrasound performed without any visible lesions. At the same time she had an echocardiogram that showed no progression of her aortic insufficiency or significant dilatation of the visible parts of the aorta. LVEF was 55-60%. Diastolic dysfunction was described as grade 1. Her stress test in 2013 she had angina and an abnormal ECG response consistent with ischemia, but the perfusion images were "normal".  Past Medical History:  Diagnosis Date  . Aortic insufficiency   . CAD (coronary artery disease)    CABG LIMA to LAD,vein graft to diagonal,vein graft to OM  . Dyslipidemia   . GERD (gastroesophageal reflux disease)   . Hypothyroid     Past Surgical History:  Procedure Laterality Date  . BACK SURGERY  2009  . CORONARY ANGIOPLASTY  2003   cutting balloon atherectomy in-stent stenosis of the LAD.  [name]. CORONARY ANGIOPLASTY WITH STENT PLACEMENT  2002   remote PCI & stenting LAD,  . CORONARY ARTERY BYPASS GRAFT  2004   LIMA to LAD,vein graft to diagonal,vein graft to OM  . LEFT HEART CATHETERIZATION WITH CORONARY ANGIOGRAM N/A 12/15/2011   Procedure: LEFT HEART CATHETERIZATION WITH CORONARY ANGIOGRAM;  Surgeon: Thurmon FairMihai Hussien Greenblatt, MD;  Location: [facility];  Service: Cardiovascular;  Laterality: N/A;  . NM MYOCAR PERF WALL MOTION  12/01/2011   normal study-persistent exercise induced ECG  changes raises the concern for ischemia (balanced ischemia).    Current Medications: Outpatient Medications Prior to Visit  Medication Sig Dispense Refill  . ALPRAZolam (XANAX) 0.25 MG tablet Take 0.25 mg by mouth 3 (three) times daily as needed.     [name] aspirin EC 81 MG tablet Take 81  mg by mouth daily.    [name] HYDROcodone-acetaminophen (NORCO/VICODIN) 5-325 MG per tablet Take 1 tablet by mouth every 6 (six) hours as needed for moderate pain.    [name] levothyroxine (SYNTHROID, LEVOTHROID) 88 MCG tablet Take 88 mcg by mouth daily before breakfast.     . metoprolol (LOPRESSOR) 100 MG tablet Take 1 tablet (100 mg) in  the morning and take 1/2  tablet (50 mg) in the  evening 135 tablet 9  . nitroGLYCERIN (NITROSTAT) 0.4 MG SL tablet Place 1 tablet (0.4 mg total) under the tongue every 5 (five) minutes as needed for chest pain. 25 tablet 3  . rosuvastatin (CRESTOR) 20 MG tablet Take 1 tablet (20 mg total) by mouth daily. 30 tablet 6  . Zoledronic Acid (RECLAST IV) Inject into the vein. Once yearly    . furosemide (LASIX) 20 MG tablet Take 1 tablet (20 mg total) by mouth daily. 90 tablet 3   No facility-administered medications prior to visit.      Allergies:   Sulfa antibiotics   Social History   Social History  . Marital status: Married    Spouse name: N/A  . Number of children: N/A  . Years of education: N/A   Social History Main Topics  . Smoking status: Never Smoker  . Smokeless tobacco: Never Used  . Alcohol use No  . Drug use: No  . Sexual activity: Not Asked   Other Topics Concern  . None   Social History Narrative   Mother of 2 with 4 grandchildren     Family History:  The patient's family history includes Asthma in her maternal grandmother; Heart attack in her mother; Heart failure in her father; Hypertension in her father.   ROS:   Please see the history of present illness.    ROS All other systems reviewed and are negative.   PHYSICAL EXAM:   VS:  BP (!) 142/67   Pulse (!) 54   Resp 11   Ht 5\' 2"  (1.575 m)   Wt 52.3 kg (115 lb 3.2 oz)   BMI 21.07 kg/m    GEN: Well nourished, well developed, in no acute distress  HEENT: normal  Neck: no JVD, carotid bruits, or masses Cardiac: RRR; no murmurs, rubs, or gallops,no edema  Respiratory:  clear to  auscultation bilaterally, normal work of breathing GI: soft, nontender, nondistended, + BS MS: no deformity or atrophy  Skin: warm and dry, no rash Neuro:  Alert and Oriented x 3, Strength and sensation are intact Psych: euthymic mood, full affect  Wt Readings from Last 3 Encounters:  02/16/16 52.3 kg (115 lb 3.2 oz)  01/30/16 51.3 kg (113 lb)  01/16/16 51.5 kg (113 lb 9.6 oz)      Studies/Labs Reviewed:   EKG:  EKG is ordered today.  The ekg ordered today demonstrates Normal sinus rhythm, T-wave inversion in leads V1-V3 (old), QTC 440 ms  Recent Labs: 02/05/2016: ALT 10; BUN 7; Creatinine, Ser 0.60; Potassium 3.9; Sodium 139   Lipid Panel    Component Value Date/Time   CHOL 127 07/11/2014 0913   TRIG 62 07/11/2014 0913   HDL 62 07/11/2014 0913   CHOLHDL 2.0  07/11/2014 0913   VLDL 12 07/11/2014 0913   LDLCALC 53 07/11/2014 0913     ASSESSMENT:    1. Ascending aortic aneurysm (HCC)      PLAN:  In order of problems listed above:  1. Asc Ao Aneurysm: Small by previous evaluation, 3.7 cm on current echo, smaller than before. She is a very small frame person. Since he don't have a clear explanation for her chest pain, will reimage with CT angiogram of the chest. 2. CAD s/p CABG: low-risk stress Myoview studies. 3. HLP: On statin, retrieve the most recent labs from PCP 4. AI: mild by echo, Unable to hear murmur on physical exam, reevaluated by echo.   Medication Adjustments/Labs and Tests Ordered: Current medicines are reviewed at length with the patient today.  Concerns regarding medicines are outlined above.  Medication changes, Labs and Tests ordered today are listed in the Patient Instructions below. Patient Instructions  Medication Instructions: Dr Royann Shivers recommends that you continue on your current medications as directed. Please refer to the Current Medication list given to you today.  Labwork: NONE ORDERED  Testing/Procedures: 1. CT Angiogram of the Chest  - Non-Cardiac CT Angiography (CTA), is a special type of CT scan that uses a computer to produce multi-dimensional views of major blood vessels throughout the body. In CT angiography, a contrast material is injected through an IV to help visualize the blood vessels.  Follow-up: Dr Royann Shivers recommends that you schedule a follow-up appointment in 1 year. You will receive a reminder letter in the mail two months in advance. If you don't receive a letter, please call our office to schedule the follow-up appointment.  If you need a refill on your cardiac medications before your next appointment, please call your pharmacy.    Signed, Thurmon Fair, MD  02/16/2016 12:55 PM    [facility] [address] [place], [place], [place]  [address] Phone: [phone]; Fax: [phone]

## 2016-02-16 NOTE — Patient Instructions (Signed)
Medication Instructions: Dr Royann Shiversroitoru recommends that you continue on your current medications as directed. Please refer to the Current Medication list given to you today.  Labwork: NONE ORDERED  Testing/Procedures: 1. CT Angiogram of the Chest - Non-Cardiac CT Angiography (CTA), is a special type of CT scan that uses a computer to produce multi-dimensional views of major blood vessels throughout the body. In CT angiography, a contrast material is injected through an IV to help visualize the blood vessels.  Follow-up: Dr Royann Shiversroitoru recommends that you schedule a follow-up appointment in 1 year. You will receive a reminder letter in the mail two months in advance. If you don't receive a letter, please call our office to schedule the follow-up appointment.  If you need a refill on your cardiac medications before your next appointment, please call your pharmacy.

## 2016-02-25 ENCOUNTER — Encounter (HOSPITAL_COMMUNITY): Payer: Medicare HMO

## 2016-02-25 ENCOUNTER — Telehealth: Payer: Self-pay | Admitting: Physician Assistant

## 2016-02-25 MED ORDER — HYDROCODONE-ACETAMINOPHEN 5-325 MG PO TABS: 1.0000 | ORAL_TABLET | Freq: Four times a day (QID) | ORAL | 0 refills | Status: DC | PRN

## 2016-02-25 NOTE — Telephone Encounter (Signed)
Please advise on script for hydrocodone.

## 2016-02-25 NOTE — Addendum Note (Signed)
Addended by: Remus LofflerJONES, Stormee Duda S on: 02/25/2016 02:42 PM   Modules accepted: Orders

## 2016-02-25 NOTE — Telephone Encounter (Signed)
Patient notified that rx up front 

## 2016-02-27 ENCOUNTER — Ambulatory Visit (INDEPENDENT_AMBULATORY_CARE_PROVIDER_SITE_OTHER)
Admission: RE | Admit: 2016-02-27 | Discharge: 2016-02-27 | Disposition: A | Discharge status: Home / Self Care | Payer: Medicare HMO | Source: Ambulatory Visit | Attending: Cardiovascular Disease | Admitting: Cardiovascular Disease

## 2016-02-27 DIAGNOSIS — I7121 Aneurysm of the ascending aorta, without rupture: Secondary | ICD-10-CM

## 2016-02-27 DIAGNOSIS — I712 Thoracic aortic aneurysm, without rupture: Secondary | ICD-10-CM | POA: Diagnosis not present

## 2016-02-27 DIAGNOSIS — I77810 Thoracic aortic ectasia: Secondary | ICD-10-CM | POA: Diagnosis not present

## 2016-02-27 MED ORDER — IOPAMIDOL (ISOVUE-370) INJECTION 76%
100.0000 mL | Freq: Once | INTRAVENOUS | Status: AC | PRN
  Administered 2016-02-27: 80 mL via INTRAVENOUS

## 2016-03-01 ENCOUNTER — Encounter: Payer: Self-pay | Admitting: Physician Assistant

## 2016-03-02 ENCOUNTER — Encounter (HOSPITAL_COMMUNITY): Payer: Self-pay

## 2016-03-02 ENCOUNTER — Telehealth: Payer: Self-pay | Admitting: Cardiovascular Disease

## 2016-03-02 ENCOUNTER — Encounter (HOSPITAL_COMMUNITY)
Admission: RE | Admit: 2016-03-02 | Discharge: 2016-03-02 | Disposition: A | Discharge status: Home / Self Care | Payer: Medicare HMO | Source: Ambulatory Visit | Attending: Physician Assistant | Admitting: Physician Assistant

## 2016-03-02 DIAGNOSIS — Z780 Asymptomatic menopausal state: Secondary | ICD-10-CM | POA: Insufficient documentation

## 2016-03-02 DIAGNOSIS — K909 Intestinal malabsorption, unspecified: Secondary | ICD-10-CM | POA: Insufficient documentation

## 2016-03-02 DIAGNOSIS — M810 Age-related osteoporosis without current pathological fracture: Secondary | ICD-10-CM | POA: Diagnosis not present

## 2016-03-02 MED ORDER — SODIUM CHLORIDE 0.9 % IV SOLN
Freq: Once | INTRAVENOUS | Status: AC
  Administered 2016-03-02: 09:00:00 via INTRAVENOUS

## 2016-03-02 MED ORDER — ZOLEDRONIC ACID 5 MG/100ML IV SOLN
5.0000 mg | Freq: Once | INTRAVENOUS | Status: AC
  Administered 2016-03-02: 5 mg via INTRAVENOUS
  Filled 2016-03-02: qty 100

## 2016-03-02 NOTE — Telephone Encounter (Signed)
Mrs. Evelyn [name] is calling regarding her CT Scan that she had on Friday  Have a conflicting report and needs clarification  . Please call   Thanks

## 2016-03-02 NOTE — Telephone Encounter (Signed)
Returned call, discussed CT findings. Pt noted she was given results as far as concerns for her thyroid but was not clear on what the results following her aortic aneurysm were & wanted to make sure this was folowed. 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 Royann Shiversroitoru noted this was stable and not appreciably changed from the prior study. Informed her of his recommendation to recheck in 3-5 years but to call sooner if symptoms of concern. Pt voiced understanding and thanks.

## 2016-03-09 ENCOUNTER — Encounter: Payer: Self-pay | Admitting: Physician Assistant

## 2016-03-09 ENCOUNTER — Ambulatory Visit (INDEPENDENT_AMBULATORY_CARE_PROVIDER_SITE_OTHER): Payer: Medicare HMO | Admitting: Physician Assistant

## 2016-03-09 VITALS — BP 111/72 | HR 87 | Temp 96.6°F | Ht 62.0 in | Wt 113.2 lb

## 2016-03-09 DIAGNOSIS — Z23 Encounter for immunization: Secondary | ICD-10-CM | POA: Diagnosis not present

## 2016-03-09 DIAGNOSIS — E042 Nontoxic multinodular goiter: Secondary | ICD-10-CM | POA: Diagnosis not present

## 2016-03-09 DIAGNOSIS — S22080D Wedge compression fracture of T11-T12 vertebra, subsequent encounter for fracture with routine healing: Secondary | ICD-10-CM | POA: Diagnosis not present

## 2016-03-09 DIAGNOSIS — K047 Periapical abscess without sinus: Secondary | ICD-10-CM

## 2016-03-09 DIAGNOSIS — Q891 Congenital malformations of adrenal gland: Secondary | ICD-10-CM | POA: Diagnosis not present

## 2016-03-09 DIAGNOSIS — E039 Hypothyroidism, unspecified: Secondary | ICD-10-CM | POA: Diagnosis not present

## 2016-03-09 MED ORDER — PENICILLIN V POTASSIUM 500 MG PO TABS: 500.0000 mg | ORAL_TABLET | Freq: Four times a day (QID) | ORAL | 1 refills | Status: DC

## 2016-03-09 NOTE — Progress Notes (Signed)
BP 111/72   Pulse 87   Temp (!) 96.6 F (35.9 C) (Oral)   Ht _0  (1.575 m)   Wt 113 lb 3.2 oz (51.3 kg)   BMI 20.70 kg/m    Subjective:    Patient ID: Evelyn [name], female    DOB: 1941-05-04, 75 y.o.   MRN: [id number]  HPI: Evelyn [name] is a 75 y.o. female presenting on 03/09/2016 for Discuss CT scan (and labs )  On the patient's most recent CTA performed through cardiology there was found to be a multinodular goiter, mild left adrenal gland hypertrophy and T11 vertebral body wedging. The vertebral body wedging goes back to at least 2005 or before and was known to [place]. Patient does have hypothyroidism and has been well controlled on 88 g of Synthroid. During the time of her hypothyroidism she was able to go off the medicine completely. She remembers that this was during the time she saw Dr. Rollene Fare. However she has had to go back on some medication in recent years. She has no specific symptoms of of thyroid disorder or adrenal gland symptoms. She has not seen an endocrinologist in the past.  The patient is also having a little bit of a dental gingivitis at this time. She does take Penicillin VK as prophylaxis when she goes to the dentist and would like to have a refill on this medication. We will send this in for her.  We will update her flu vaccine and have labs performed today. A referral will be made for endocrine.  Past Medical History:  Diagnosis Date  . Aortic insufficiency   . CAD (coronary artery disease)    CABG LIMA to LAD,vein graft to diagonal,vein graft to OM  . Dyslipidemia   . GERD (gastroesophageal reflux disease)   . Hypothyroid     Relevant past medical, surgical, family and social history reviewed and updated as indicated. Interim medical history since our last visit reviewed. Allergies and medications reviewed and updated. DATA REVIEWED: CHART IN EPIC  Social History   Social History  . Marital status: Married    Spouse name: N/A  . Number of  children: N/A  . Years of education: N/A   Occupational History  . Not on file.   Social History Main Topics  . Smoking status: Never Smoker  . Smokeless tobacco: Never Used  . Alcohol use No  . Drug use: No  . Sexual activity: Not on file   Other Topics Concern  . Not on file   Social History Narrative   Mother of 2 with 4 grandchildren    Past Surgical History:  Procedure Laterality Date  . BACK SURGERY  2009  . CORONARY ANGIOPLASTY  2003   cutting balloon atherectomy in-stent stenosis of the LAD.  [name] CORONARY ANGIOPLASTY WITH STENT PLACEMENT  2002   remote PCI & stenting LAD,  . CORONARY ARTERY BYPASS GRAFT  2004   LIMA to LAD,vein graft to diagonal,vein graft to OM  . LEFT HEART CATHETERIZATION WITH CORONARY ANGIOGRAM N/A 12/15/2011   Procedure: LEFT HEART CATHETERIZATION WITH CORONARY ANGIOGRAM;  Surgeon: Sanda Klein, MD;  Location: [facility];  Service: Cardiovascular;  Laterality: N/A;  . NM MYOCAR PERF WALL MOTION  12/01/2011   normal study-persistent exercise induced ECG changes raises the concern for ischemia (balanced ischemia).    Family History  Problem Relation Age of Onset  . Heart attack Mother   . Heart failure Father   . Hypertension Father   .  Asthma Maternal Grandmother     Review of Systems  Constitutional: Negative.  Negative for activity change, fatigue and fever.  HENT: Positive for mouth sores.   Eyes: Negative.   Respiratory: Negative.  Negative for cough.   Cardiovascular: Negative.  Negative for chest pain.  Gastrointestinal: Negative.  Negative for abdominal pain.  Endocrine: Negative.   Genitourinary: Negative.  Negative for dysuria.  Musculoskeletal: Positive for arthralgias, back pain and joint swelling.  Skin: Negative.   Neurological: Negative.   Psychiatric/Behavioral: Negative.       Medication List       Accurate as of 03/09/16  9:50 AM. Always use your most recent med list.          ALPRAZolam 0.25 MG  tablet Commonly known as:  XANAX Take 0.25 mg by mouth 3 (three) times daily as needed.   aspirin EC 81 MG tablet Take 81 mg by mouth daily.   furosemide 20 MG tablet Commonly known as:  LASIX Take 1 tablet (20 mg total) by mouth daily.   HYDROcodone-acetaminophen 5-325 MG tablet Commonly known as:  NORCO/VICODIN Take 1 tablet by mouth every 6 (six) hours as needed for moderate pain.   levothyroxine 88 MCG tablet Commonly known as:  SYNTHROID, LEVOTHROID Take 88 mcg by mouth daily before breakfast.   metoprolol 100 MG tablet Commonly known as:  LOPRESSOR Take 1 tablet (100 mg) in  the morning and take 1/2  tablet (50 mg) in the  evening   nitroGLYCERIN 0.4 MG SL tablet Commonly known as:  NITROSTAT Place 1 tablet (0.4 mg total) under the tongue every 5 (five) minutes as needed for chest pain.   penicillin v potassium 500 MG tablet Commonly known as:  VEETID Take 1 tablet (500 mg total) by mouth 4 (four) times daily.   RECLAST IV Inject into the vein. Once yearly   rosuvastatin 20 MG tablet Commonly known as:  CRESTOR Take 1 tablet (20 mg total) by mouth daily.          Objective:    BP 111/72   Pulse 87   Temp (!) 96.6 F (35.9 C) (Oral)   Ht _0  (1.575 m)   Wt 113 lb 3.2 oz (51.3 kg)   BMI 20.70 kg/m   Allergies  Allergen Reactions  . Sulfa Antibiotics Swelling    Wt Readings from Last 3 Encounters:  03/09/16 113 lb 3.2 oz (51.3 kg)  03/02/16 115 lb (52.2 kg)  02/16/16 115 lb 3.2 oz (52.3 kg)    Physical Exam  Constitutional: She is oriented to person, place, and time. She appears well-developed and well-nourished.  HENT:  Head: Normocephalic and atraumatic.  Eyes: Conjunctivae and EOM are normal. Pupils are equal, round, and reactive to light.  Neck: Trachea normal and normal range of motion. Neck supple. Carotid bruit is not present. No thyroid mass and no thyromegaly present.  Cardiovascular: Normal rate, regular rhythm, normal heart sounds  and intact distal pulses.   Pulmonary/Chest: Effort normal and breath sounds normal.  Neurological: She is alert and oriented to person, place, and time. She has normal reflexes.  Skin: Skin is warm and dry. No rash noted.  Psychiatric: She has a normal mood and affect. Her behavior is normal. Judgment and thought content normal.    Results for orders placed or performed in visit on 02/05/16  CMP14+EGFR  Result Value Ref Range   Glucose 86 65 - 99 mg/dL   BUN 7 (L) 8 - 27 mg/dL  Creatinine, Ser 0.60 0.57 - 1.00 mg/dL   GFR calc non Af Amer 88 >59 mL/min/1.73   GFR calc Af Amer 102 >59 mL/min/1.73   BUN/Creatinine Ratio 12 12 - 28   Sodium 139 134 - 144 mmol/L   Potassium 3.9 3.5 - 5.2 mmol/L   Chloride 100 96 - 106 mmol/L   CO2 24 18 - 29 mmol/L   Calcium 8.7 8.7 - 10.3 mg/dL   Total Protein 6.3 6.0 - 8.5 g/dL   Albumin 4.0 3.5 - 4.8 g/dL   Globulin, Total 2.3 1.5 - 4.5 g/dL   Albumin/Globulin Ratio 1.7 1.2 - 2.2   Bilirubin Total 0.5 0.0 - 1.2 mg/dL   Alkaline Phosphatase 56 39 - 117 IU/L   AST 13 0 - 40 IU/L   ALT 10 0 - 32 IU/L      Assessment & Plan:   1. 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 Closed wedge compression fracture of eleventh thoracic vertebra with routine healing, subsequent encounter  4. Hypothyroidism, unspecified type - Ambulatory referral to Endocrinology  5. Dental abscess - penicillin v potassium (VEETID) 500 MG tablet; Take 1 tablet (500 mg total) by mouth 4 (four) times daily.  Dispense: 40 tablet; Refill: 1   Continue all other maintenance medications as listed above.  Follow up plan: Return if symptoms worsen or fail to improve.  Orders Placed This Encounter  Procedures  . Thyroid Panel With TSH  . Ambulatory referral to Endocrinology    Educational handout given for goiter  Terald Sleeper  PA-C [facility] [address]  [place], [place] [address] [phone]   03/09/2016, 9:50 AM

## 2016-03-09 NOTE — Patient Instructions (Signed)
Goiter A goiter is an enlarged thyroid gland. The thyroid gland is located in the lower front of the neck. The gland produces hormones that regulate mood, body temperature, pulse rate, and digestion. Most goiters are painless and are not a cause for serious concern. Goiters and conditions that cause goiters can be treated, if necessary. CAUSES Causes of this condition include:  Diseases that attack healthy cells in your body (autoimmune diseases) and affect your thyroid function, such as:  Graves disease. This causes too much thyroid hormone to be produced and it makes your thyroid overly active (hyperthyroidism).  Hashimoto disease. This type of inflammation of the thyroid (thyroiditis) causes too little thyroid hormone to be produced and it makes your thyroid not active enough (hypothyroidism).  Other conditions that cause thyroiditis.  Nodular goiter. This means that there are one or more small growths on your thyroid. These can create too much thyroid hormone.  Pregnancy.  Thyroid cancer. This is rare.  Certain medicines.  Radiation exposure.  Iodine deficiency. In some cases, the cause may not be known (idiopathic). RISK FACTORS This condition is more likely to develop in:  People who have a family history of goiter.  Women.  People who do not get enough iodine in their diet.  People who are older than 40.  People who smoke tobacco. SYMPTOMS Common symptoms of this condition include:  Swelling in the lower part of the neck. This swelling can range from a very small bump to a large lump.  A tight feeling in the throat.  A hoarse voice. Other symptoms include:   Coughing.  Wheezing.  Difficulty swallowing.  Difficulty breathing.  Bulging neck veins.  Dizziness. In some cases, there are no symptoms and thyroid hormone levels may be normal. When a goiter is the result of hyperthyroidism, symptoms may also include:  Nervousness or  restlessness.  Inability to tolerate heat.  Unexplained weight loss.  Diarrhea.  Change in the texture of hair or skin.  Changes in heart beat, such as skipped beats, extra beats, or a rapid heart rate.  Loss of menstruation.  Shaky hands.  Increased appetite.  Sleep problems. When a goiter is the result of hypothyroidism, symptoms may also include:  Feeling like you have no energy (lethargy).  Inability to tolerate cold.  Weight gain that is not explained by a change in diet or exercise habits.  Dry skin.  Coarse hair.  Menstrual irregularity.  Constipation.  Sadness or depression. DIAGNOSIS This condition may be diagnosed with a medical history and physical exam. You may also have other tests, including:  Blood tests to check thyroid function.  Imaging tests, such as:  Ultrasonography.  CT scan.  MRI.  Thyroid scan. You will be given a safe radioactive injection, then images will be taken of your thyroid.  Tissue sample (biopsy) of the goiter or any nodules. This checks to see if the goiter or nodules are cancerous. TREATMENT Treatment for this condition depends on the cause. Treatment may include:  Medicines to control your thyroid.  Anti-inflammatory or steroid medicines, if inflammation is the cause.  Iodine supplements or changes in diet, if the goiter is caused by iodine deficiency.  Radiation therapy.  Surgery to remove your thyroid. In some cases, no treatment is necessary, and your health care provider will monitor your condition at regular checkups. HOME CARE INSTRUCTIONS  Follow recommendations from your health care provider for any changes to your diet.  Take over-the-counter and prescription medicines only as told by   your health care provider.  Do not use any tobacco products, including cigarettes, chewing tobacco, or e-cigarettes. If you need help quitting, ask your health care provider.  Keep all follow-up appointments as told  by your health care provider. This is important. SEEK MEDICAL CARE IF:  Your symptoms do not get better with treatment. SEEK IMMEDIATE MEDICAL CARE IF:  You develop sudden, unexplained confusion or other mental changes.  You have nausea, vomiting, or diarrhea.  You develop a fever.  Your skin or the whites of your eyes appear yellow (jaundice).  You develop chest pain.  You have trouble breathing or swallowing.  You suddenly become very weak.  You experience extreme restlessness.   This information is not intended to replace advice given to you by your health care provider. Make sure you discuss any questions you have with your health care provider.   Document Released: 10/21/2009 Document Revised: 09/17/2014 Document Reviewed: 04/29/2014 Elsevier Interactive Patient Education 2016 [employer]  

## 2016-03-10 LAB — THYROID PANEL WITH TSH
FREE THYROXINE INDEX: 1.3 (ref 1.2–4.9)
T3 UPTAKE RATIO: 22 % — AB (ref 24–39)
T4, Total: 6 ug/dL (ref 4.5–12.0)
TSH: 12.68 u[IU]/mL — ABNORMAL HIGH (ref 0.450–4.500)

## 2016-03-15 ENCOUNTER — Other Ambulatory Visit: Payer: Self-pay

## 2016-03-15 ENCOUNTER — Telehealth: Payer: Self-pay | Admitting: Physician Assistant

## 2016-03-15 MED ORDER — LEVOTHYROXINE SODIUM 100 MCG PO TABS: 100.0000 ug | ORAL_TABLET | Freq: Every day | ORAL | 2 refills | Status: DC

## 2016-03-15 NOTE — Telephone Encounter (Signed)
Called patient with lab results, patient aware of endocrinology appointment

## 2016-03-29 ENCOUNTER — Telehealth: Payer: Self-pay

## 2016-03-29 NOTE — Telephone Encounter (Signed)
[name] gave Evelyn CashingWendy Wray the [name] and the orders. Was sent. Patient received on 03/02/16 the infusion.  No other instructions given about any other processes.

## 2016-04-01 ENCOUNTER — Telehealth: Payer: Self-pay | Admitting: Physician Assistant

## 2016-04-01 NOTE — Telephone Encounter (Signed)
Busy - 11/16-jhb

## 2016-04-02 ENCOUNTER — Other Ambulatory Visit: Payer: Self-pay

## 2016-04-02 DIAGNOSIS — E039 Hypothyroidism, unspecified: Secondary | ICD-10-CM

## 2016-04-02 NOTE — Telephone Encounter (Signed)
Patient aware she only needs to come in to have labwork to recheck thyroid, [facility]

## 2016-04-07 ENCOUNTER — Ambulatory Visit: Payer: Medicare HMO | Admitting: Physician Assistant

## 2016-04-12 ENCOUNTER — Encounter: Payer: Self-pay | Admitting: "Endocrinology

## 2016-04-12 ENCOUNTER — Ambulatory Visit (INDEPENDENT_AMBULATORY_CARE_PROVIDER_SITE_OTHER): Payer: Medicare HMO | Admitting: "Endocrinology

## 2016-04-12 VITALS — BP 113/77 | HR 58 | Ht 62.0 in | Wt 113.0 lb

## 2016-04-12 DIAGNOSIS — E042 Nontoxic multinodular goiter: Secondary | ICD-10-CM

## 2016-04-12 DIAGNOSIS — E038 Other specified hypothyroidism: Secondary | ICD-10-CM

## 2016-04-12 NOTE — Progress Notes (Signed)
Subjective:    Patient ID: Evelyn [name], female    DOB: [birth year], PCP Terald Sleeper, PA-C   Past Medical History:  Diagnosis Date  . Aortic insufficiency   . CAD (coronary artery disease)    CABG LIMA to LAD,vein graft to diagonal,vein graft to OM  . Dyslipidemia   . GERD (gastroesophageal reflux disease)   . Hypothyroid    Past Surgical History:  Procedure Laterality Date  . BACK SURGERY  2009  . CORONARY ANGIOPLASTY  2003   cutting balloon atherectomy in-stent stenosis of the LAD.  [name] CORONARY ANGIOPLASTY WITH STENT PLACEMENT  2002   remote PCI & stenting LAD,  . CORONARY ARTERY BYPASS GRAFT  2004   LIMA to LAD,vein graft to diagonal,vein graft to OM  . LEFT HEART CATHETERIZATION WITH CORONARY ANGIOGRAM N/A 12/15/2011   Procedure: LEFT HEART CATHETERIZATION WITH CORONARY ANGIOGRAM;  Surgeon: Sanda Klein, MD;  Location: [facility];  Service: Cardiovascular;  Laterality: N/A;  . NM MYOCAR PERF WALL MOTION  12/01/2011   normal study-persistent exercise induced ECG changes raises the concern for ischemia (balanced ischemia).   Social History   Social History  . Marital status: Married    Spouse name: N/A  . Number of children: N/A  . Years of education: N/A   Social History Main Topics  . Smoking status: Never Smoker  . Smokeless tobacco: Never Used  . Alcohol use No  . Drug use: No  . Sexual activity: Not Asked   Other Topics Concern  . None   Social History Narrative   Mother of 2 with 4 grandchildren   Outpatient Encounter Prescriptions as of 04/12/2016  Medication Sig  . ALPRAZolam (XANAX) 0.25 MG tablet Take 0.25 mg by mouth 3 (three) times daily as needed.   [name] aspirin EC 81 MG tablet Take 81 mg by mouth daily.  . furosemide (LASIX) 20 MG tablet Take 1 tablet (20 mg total) by mouth daily.  [name] HYDROcodone-acetaminophen (NORCO/VICODIN) 5-325 MG tablet Take 1 tablet by mouth every 6 (six) hours as needed for moderate pain.  [name] levothyroxine (SYNTHROID,  LEVOTHROID) 100 MCG tablet Take 1 tablet (100 mcg total) by mouth daily.  . metoprolol (LOPRESSOR) 100 MG tablet Take 1 tablet (100 mg) in  the morning and take 1/2  tablet (50 mg) in the  evening  . nitroGLYCERIN (NITROSTAT) 0.4 MG SL tablet Place 1 tablet (0.4 mg total) under the tongue every 5 (five) minutes as needed for chest pain.  . rosuvastatin (CRESTOR) 20 MG tablet Take 1 tablet (20 mg total) by mouth daily.  . Zoledronic Acid (RECLAST IV) Inject into the vein. Once yearly  . [DISCONTINUED] penicillin v potassium (VEETID) 500 MG tablet Take 1 tablet (500 mg total) by mouth 4 (four) times daily.   No facility-administered encounter medications on file as of 04/12/2016.    ALLERGIES: Allergies  Allergen Reactions  . Sulfa Antibiotics Swelling   VACCINATION STATUS: Immunization History  Administered Date(s) Administered  . Influenza, High Dose Seasonal PF 03/09/2016    HPI  75 year old female with medical history as above. She is being seen in consultation for hypothyroidism and recently discovered multinodular goiter on CT scan requested by Particia Nearing - She reports that she was diagnosed with hypothyroidism approximately 4 years ago. Initially she was taking thyroid hormone on and off, however recently more consistently, currently 100 g since her last blood work on 03/09/2016 which showed significantly above target TSH of 12.6.  -  She denies family history of thyroid dysfunction. She feels occasional choking sensation in her neck, no recent voice change nor shortness of breath. She denies any exposure to neck radiation. - She denies any history of smoking.   Review of Systems Constitutional: She has steady weight, no fatigue, no subjective hyperthermia/hypothermia Eyes: no blurry vision, no xerophthalmia ENT: no sore throat, + nodules palpated in throat,  + intermittent dysphagia/odynophagia, no hoarseness Cardiovascular: no CP/SOB/palpitations/leg swelling Respiratory: no  cough/SOB Gastrointestinal: no N/V/D/C Musculoskeletal: no muscle/joint aches Skin: no rashes Neurological: no tremors/numbness/tingling/dizziness Psychiatric: no depression/anxiety  Objective:    BP 113/77   Pulse (!) 58   Ht 5' 2"  (1.575 m)   Wt 113 lb (51.3 kg)   BMI 20.67 kg/m   Wt Readings from Last 3 Encounters:  04/12/16 113 lb (51.3 kg)  03/09/16 113 lb 3.2 oz (51.3 kg)  03/02/16 115 lb (52.2 kg)    Physical Exam  Constitutional:  in NAD 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, strength intact in all 4 Skin: moist, warm, no rashes Neurological: no tremor with outstretched hands, DTR normal in all 4  CMP     Component Value Date/Time   NA 139 02/05/2016 1102   K 3.9 02/05/2016 1102   CL 100 02/05/2016 1102   CO2 24 02/05/2016 1102   GLUCOSE 86 02/05/2016 1102   GLUCOSE 90 07/11/2014 0913   BUN 7 (L) 02/05/2016 1102   CREATININE 0.60 02/05/2016 1102   CREATININE 0.59 07/11/2014 0913   CALCIUM 8.7 02/05/2016 1102   PROT 6.3 02/05/2016 1102   ALBUMIN 4.0 02/05/2016 1102   AST 13 02/05/2016 1102   ALT 10 02/05/2016 1102   ALKPHOS 56 02/05/2016 1102   BILITOT 0.5 02/05/2016 1102   GFRNONAA 88 02/05/2016 1102   GFRAA 102 02/05/2016 1102   Recent Results (from the past 2160 hour(s))  Myocardial Perfusion Imaging     Status: None   Collection Time: 01/30/16  3:38 PM  Result Value Ref Range   Rest HR 64 bpm   Rest BP 150/81 mmHg   RPE 17    Exercise duration (sec) 34 sec   Percent HR 90 %   Exercise duration (min) 4 min   Estimated workload 5.4 METS   Peak HR 130 bpm   Peak BP 160/91 mmHg   MPHR 143 bpm   SSS 3    SRS 1    SDS 2    TID 1.18    LV sys vol 23 mL   LV dias vol 60 46 - 106 mL  CMP14+EGFR     Status:  Abnormal   Collection Time: 02/05/16 11:02 AM  Result Value Ref Range   Glucose 86 65 - 99 mg/dL   BUN 7 (L) 8 - 27 mg/dL   Creatinine, Ser 0.60 0.57 - 1.00 mg/dL   GFR calc non Af Amer 88 >59 mL/min/1.73   GFR calc Af Amer 102 >59 mL/min/1.73   BUN/Creatinine Ratio 12 12 - 28   Sodium 139 134 - 144 mmol/L   Potassium 3.9 3.5 - 5.2 mmol/L   Chloride 100 96 - 106 mmol/L   CO2 24 18 - 29 mmol/L   Calcium 8.7 8.7 - 10.3 mg/dL   Total Protein 6.3 6.0 - 8.5 g/dL   Albumin 4.0 3.5 - 4.8  g/dL   Globulin, Total 2.3 1.5 - 4.5 g/dL   Albumin/Globulin Ratio 1.7 1.2 - 2.2   Bilirubin Total 0.5 0.0 - 1.2 mg/dL   Alkaline Phosphatase 56 39 - 117 IU/L   AST 13 0 - 40 IU/L   ALT 10 0 - 32 IU/L  Thyroid Panel With TSH     Status: Abnormal   Collection Time: 03/09/16  9:56 AM  Result Value Ref Range   TSH 12.680 (H) 0.450 - 4.500 uIU/mL   T4, Total 6.0 4.5 - 12.0 ug/dL   T3 Uptake Ratio 22 (L) 24 - 39 %   Free Thyroxine Index 1.3 1.2 - 4.9       Assessment & Plan:   1. Other specified hypothyroidism 2. Multinodular goiter - I have reviewed her available thyroid records as well as clinically evaluated patient. Regarding her hypothyroidism, we discussed the need for thyroid hormone for life. I will keep her on her current dose of levothyroxine 100 g by mouth every morning.  - We discussed about correct intake of levothyroxine, at fasting, with water, separated by at least 30 minutes from breakfast, and separated by more than 4 hours from calcium, iron, multivitamins, acid reflux medications (PPIs). -Patient is made aware of the fact that thyroid hormone replacement is needed for life, dose to be adjusted by periodic monitoring of thyroid function tests.  - Regarding CT finding of multinodular goiter, I discussed with her the need for more dedicated thyroid/neck imaging with ultrasound. This imaging will be scheduled to be done as soon as possible. Next episode depend on the findings. If she has  significant findings should be called to come sooner. If findings are unremarkable we will discuss it when she returns in 3 months with repeat thyroid function test.   - I advised patient to maintain close follow up with Terald Sleeper, PA-C for primary care needs. Follow up plan: Return in about 3 months (around 07/13/2016) for Thyroid / Neck Ultrasound, follow up with pre-visit labs.  Evelyn Lloyd, MD Phone: [phone]  Fax: [phone]   04/12/2016, 2:02 PM

## 2016-04-15 ENCOUNTER — Ambulatory Visit (INDEPENDENT_AMBULATORY_CARE_PROVIDER_SITE_OTHER): Payer: Medicare HMO | Admitting: Physician Assistant

## 2016-04-15 ENCOUNTER — Encounter: Payer: Self-pay | Admitting: Physician Assistant

## 2016-04-15 VITALS — BP 112/69 | HR 60 | Temp 97.8°F | Ht 62.0 in | Wt 113.0 lb

## 2016-04-15 DIAGNOSIS — E039 Hypothyroidism, unspecified: Secondary | ICD-10-CM

## 2016-04-15 DIAGNOSIS — S22080D Wedge compression fracture of T11-T12 vertebra, subsequent encounter for fracture with routine healing: Secondary | ICD-10-CM | POA: Diagnosis not present

## 2016-04-15 DIAGNOSIS — I10 Essential (primary) hypertension: Secondary | ICD-10-CM

## 2016-04-15 DIAGNOSIS — E782 Mixed hyperlipidemia: Secondary | ICD-10-CM

## 2016-04-15 MED ORDER — HYDROCODONE-ACETAMINOPHEN 5-325 MG PO TABS: 1.0000 | ORAL_TABLET | Freq: Four times a day (QID) | ORAL | 0 refills | Status: DC | PRN

## 2016-04-15 MED ORDER — ROSUVASTATIN CALCIUM 20 MG PO TABS: 20.0000 mg | ORAL_TABLET | Freq: Every day | ORAL | 11 refills | Status: DC

## 2016-04-15 NOTE — Patient Instructions (Signed)
Goiter Introduction A goiter is an enlarged thyroid gland. The thyroid gland is located in the lower front of the neck. The gland produces hormones that regulate mood, body temperature, pulse rate, and digestion. Most goiters are painless and are not a cause for serious concern. Goiters and conditions that cause goiters can be treated, if necessary. What are the causes? Causes of this condition include:  Diseases that attack healthy cells in your body (autoimmune diseases) and affect your thyroid function, such as:  Graves disease. This causes too much thyroid hormone to be produced and it makes your thyroid overly active (hyperthyroidism).  Hashimoto disease. This type of inflammation of the thyroid (thyroiditis) causes too little thyroid hormone to be produced and it makes your thyroid not active enough (hypothyroidism).  Other conditions that cause thyroiditis.  Nodular goiter. This means that there are one or more small growths on your thyroid. These can create too much thyroid hormone.  Pregnancy.  Thyroid cancer. This is rare.  Certain medicines.  Radiation exposure.  Iodine deficiency. In some cases, the cause may not be known (idiopathic). What increases the risk? This condition is more likely to develop in:  People who have a family history of goiter.  Women.  People who do not get enough iodine in their diet.  People who are older than 36.  People who smoke tobacco. What are the signs or symptoms? Common symptoms of this condition include:  Swelling in the lower part of the neck. This swelling can range from a very small bump to a large lump.  A tight feeling in the throat.  A hoarse voice. Other symptoms include:  Coughing.  Wheezing.  Difficulty swallowing.  Difficulty breathing.  Bulging neck veins.  Dizziness. In some cases, there are no symptoms and thyroid hormone levels may be normal. When a goiter is the result of hyperthyroidism, symptoms  may also include:  Nervousness or restlessness.  Inability to tolerate heat.  Unexplained weight loss.  Diarrhea.  Change in the texture of hair or skin.  Changes in heart beat, such as skipped beats, extra beats, or a rapid heart rate.  Loss of menstruation.  Shaky hands.  Increased appetite.  Sleep problems. When a goiter is the result of hypothyroidism, symptoms may also include:  Feeling like you have no energy (lethargy).  Inability to tolerate cold.  Weight gain that is not explained by a change in diet or exercise habits.  Dry skin.  Coarse hair.  Menstrual irregularity.  Constipation.  Sadness or depression. How is this diagnosed? This condition may be diagnosed with a medical history and physical exam. You may also have other tests, including:  Blood tests to check thyroid function.  Imaging tests, such as:  Ultrasonography.  CT scan.  MRI.  Thyroid scan. You will be given a safe radioactive injection, then images will be taken of your thyroid.  Tissue sample (biopsy) of the goiter or any nodules. This checks to see if the goiter or nodules are cancerous. How is this treated? Treatment for this condition depends on the cause. Treatment may include:  Medicines to control your thyroid.  Anti-inflammatory or steroid medicines, if inflammation is the cause.  Iodine supplements or changes in diet, if the goiter is caused by iodine deficiency.  Radiation therapy.  Surgery to remove your thyroid. In some cases, no treatment is necessary, and your health care provider will monitor your condition at regular checkups. Follow these instructions at home:  Follow recommendations from your health  care provider for any changes to your diet.  Take over-the-counter and prescription medicines only as told by your health care provider.  Do not use any tobacco products, including cigarettes, chewing tobacco, or e-cigarettes. If you need help quitting, ask  your health care provider.  Keep all follow-up appointments as told by your health care provider. This is important. Contact a health care provider if:  Your symptoms do not get better with treatment. Get help right away if:  You develop sudden, unexplained confusion or other mental changes.  You have nausea, vomiting, or diarrhea.  You develop a fever.  Your skin or the whites of your eyes appear yellow (jaundice).  You develop chest pain.  You have trouble breathing or swallowing.  You suddenly become very weak.  You experience extreme restlessness. This information is not intended to replace advice given to you by your health care provider. Make sure you discuss any questions you have with your health care provider. Document Released: 10/21/2009 Document Revised: 11/21/2015 Document Reviewed: 04/29/2014  2017 Elsevier

## 2016-04-16 LAB — LIPID PANEL
CHOLESTEROL TOTAL: 187 mg/dL (ref 100–199)
Chol/HDL Ratio: 3.6 ratio units (ref 0.0–4.4)
HDL: 52 mg/dL (ref >39)
LDL Calculated: 111 mg/dL — ABNORMAL HIGH (ref 0–99)
TRIGLYCERIDES: 118 mg/dL (ref 0–149)
VLDL Cholesterol Cal: 24 mg/dL (ref 5–40)

## 2016-04-18 NOTE — Progress Notes (Signed)
BP 112/69   Pulse 60   Temp 97.8 F (36.6 C) (Oral)   Ht 5\' 2"  (1.575 m)   Wt 113 lb (51.3 kg)   BMI 20.67 kg/m    Subjective:    Patient ID: Evelyn [name] Keithly, female    DOB: 25-Jun-1940, 75 y.o.   MRN: [id number]  HPI: Evelyn [name] Stauffer is a 75 y.o. female presenting on 04/15/2016 for Medication Refill (Pain med ) This patient comes in for periodic recheck on medications and conditions. All medications are reviewed today. There are no reports of any problems with the medications. All of the medical conditions are reviewed and updated.  Lab work is reviewed and will be ordered as medically necessary. There are no new problems reported with today's visit.  Had cardiology check and doing well.  She is on low dose of narcotic pain medication for DDD and she is unable to take NSAID due to CAD.   Past Medical History:  Diagnosis Date  . Aortic insufficiency   . CAD (coronary artery disease)    CABG LIMA to LAD,vein graft to diagonal,vein graft to OM  . Dyslipidemia   . GERD (gastroesophageal reflux disease)   . Hypothyroid    Relevant past medical, surgical, family and social history reviewed and updated as indicated. Interim medical history since our last visit reviewed. Allergies and medications reviewed and updated. DATA REVIEWED: CHART IN EPIC  Social History   Social History  . Marital status: Married    Spouse name: N/A  . Number of children: N/A  . Years of education: N/A   Occupational History  . Not on file.   Social History Main Topics  . Smoking status: Never Smoker  . Smokeless tobacco: Never Used  . Alcohol use No  . Drug use: No  . Sexual activity: Not on file   Other Topics Concern  . Not on file   Social History Narrative   Mother of 2 with 4 grandchildren    Past Surgical History:  Procedure Laterality Date  . BACK SURGERY  2009  . CORONARY ANGIOPLASTY  2003   cutting balloon atherectomy in-stent stenosis of the LAD.  [name]. CORONARY ANGIOPLASTY  WITH STENT PLACEMENT  2002   remote PCI & stenting LAD,  . CORONARY ARTERY BYPASS GRAFT  2004   LIMA to LAD,vein graft to diagonal,vein graft to OM  . LEFT HEART CATHETERIZATION WITH CORONARY ANGIOGRAM N/A 12/15/2011   Procedure: LEFT HEART CATHETERIZATION WITH CORONARY ANGIOGRAM;  Surgeon: Thurmon FairMihai Croitoru, MD;  Location: [facility];  Service: Cardiovascular;  Laterality: N/A;  . NM MYOCAR PERF WALL MOTION  12/01/2011   normal study-persistent exercise induced ECG changes raises the concern for ischemia (balanced ischemia).    Family History  Problem Relation Age of Onset  . Heart attack Mother   . Heart failure Father   . Hypertension Father   . Asthma Maternal Grandmother     Review of Systems  Constitutional: Negative.  Negative for activity change, fatigue and fever.  HENT: Negative.   Eyes: Negative.   Respiratory: Negative.  Negative for cough.   Cardiovascular: Negative.  Negative for chest pain.  Gastrointestinal: Negative.  Negative for abdominal pain.  Endocrine: Negative.   Genitourinary: Negative.  Negative for dysuria.  Musculoskeletal: Negative.   Skin: Negative.   Neurological: Negative.       Medication List       Accurate as of 04/15/16 11:59 PM. Always use your most recent med list.  ALPRAZolam 0.25 MG tablet Commonly known as:  XANAX Take 0.25 mg by mouth 3 (three) times daily as needed.   aspirin EC 81 MG tablet Take 81 mg by mouth daily.   furosemide 20 MG tablet Commonly known as:  LASIX Take 1 tablet (20 mg total) by mouth daily.   HYDROcodone-acetaminophen 5-325 MG tablet Commonly known as:  NORCO/VICODIN Take 1 tablet by mouth every 6 (six) hours as needed for moderate pain.   HYDROcodone-acetaminophen 5-325 MG tablet Commonly known as:  NORCO/VICODIN Take 1 tablet by mouth every 6 (six) hours as needed for moderate pain.   HYDROcodone-acetaminophen 5-325 MG tablet Commonly known as:  NORCO/VICODIN Take 1 tablet by mouth  every 6 (six) hours as needed for moderate pain.   levothyroxine 100 MCG tablet Commonly known as:  SYNTHROID, LEVOTHROID Take 1 tablet (100 mcg total) by mouth daily.   metoprolol 100 MG tablet Commonly known as:  LOPRESSOR Take 1 tablet (100 mg) in  the morning and take 1/2  tablet (50 mg) in the  evening   nitroGLYCERIN 0.4 MG SL tablet Commonly known as:  NITROSTAT Place 1 tablet (0.4 mg total) under the tongue every 5 (five) minutes as needed for chest pain.   RECLAST IV Inject into the vein. Once yearly   rosuvastatin 20 MG tablet Commonly known as:  CRESTOR Take 1 tablet (20 mg total) by mouth daily.          Objective:    BP 112/69   Pulse 60   Temp 97.8 F (36.6 C) (Oral)   Ht 5\' 2"  (1.575 m)   Wt 113 lb (51.3 kg)   BMI 20.67 kg/m   Allergies  Allergen Reactions  . Sulfa Antibiotics Swelling    Wt Readings from Last 3 Encounters:  04/15/16 113 lb (51.3 kg)  04/12/16 113 lb (51.3 kg)  03/09/16 113 lb 3.2 oz (51.3 kg)    Physical Exam  Constitutional: She is oriented to person, place, and time. She appears well-developed and well-nourished.  HENT:  Head: Normocephalic and atraumatic.  Eyes: Conjunctivae and EOM are normal. Pupils are equal, round, and reactive to light.  Cardiovascular: Normal rate, regular rhythm, normal heart sounds and intact distal pulses.   Pulmonary/Chest: Effort normal and breath sounds normal.  Abdominal: Soft. Bowel sounds are normal.  Neurological: She is alert and oriented to person, place, and time. She has normal reflexes.  Skin: Skin is warm and dry. No rash noted.  Psychiatric: She has a normal mood and affect. Her behavior is normal. Judgment and thought content normal.  Nursing note and vitals reviewed.   Results for orders placed or performed in visit on 04/15/16  Lipid panel  Result Value Ref Range   Cholesterol, Total 187 100 - 199 mg/dL   Triglycerides 147118 0 - 149 mg/dL   HDL 52 >82>39 mg/dL   VLDL Cholesterol  Cal 24 5 - 40 mg/dL   LDL Calculated 956111 ([name]) 0 - 99 mg/dL   Chol/HDL Ratio 3.6 0.0 - 4.4 ratio units      Assessment & Plan:   1. Essential hypertension  2. Hypothyroidism, unspecified type  3. Closed wedge compression fracture of eleventh thoracic vertebra with routine healing, subsequent encounter - HYDROcodone-acetaminophen (NORCO/VICODIN) 5-325 MG tablet; Take 1 tablet by mouth every 6 (six) hours as needed for moderate pain.  Dispense: 120 tablet; Refill: 0 - HYDROcodone-acetaminophen (NORCO/VICODIN) 5-325 MG tablet; Take 1 tablet by mouth every 6 (six) hours as needed for moderate  pain.  Dispense: 120 tablet; Refill: 0 - HYDROcodone-acetaminophen (NORCO/VICODIN) 5-325 MG tablet; Take 1 tablet by mouth every 6 (six) hours as needed for moderate pain.  Dispense: 120 tablet; Refill: 0  4. Mixed hyperlipidemia - rosuvastatin (CRESTOR) 20 MG tablet; Take 1 tablet (20 mg total) by mouth daily.  Dispense: 30 tablet; Refill: 11 - Lipid panel   Continue all other maintenance medications as listed above.  Follow up plan: Return in about 3 months (around 07/14/2016) for recheck 3 months.  Orders Placed This Encounter  Procedures  . Lipid panel    Educational handout given for hypertension  Remus Loffler PA-C [facility] [address]  [place], [place] [address] [phone]   04/18/2016, 9:59 PM

## 2016-04-20 ENCOUNTER — Telehealth: Payer: Self-pay

## 2016-04-20 NOTE — Telephone Encounter (Signed)
[facility] on 04-23-16 @ 11:30.

## 2016-04-23 ENCOUNTER — Ambulatory Visit (HOSPITAL_COMMUNITY): Payer: Medicare HMO

## 2016-04-28 ENCOUNTER — Ambulatory Visit (HOSPITAL_COMMUNITY)
Admission: RE | Admit: 2016-04-28 | Discharge: 2016-04-28 | Disposition: A | Discharge status: Home / Self Care | Payer: Medicare HMO | Source: Ambulatory Visit | Attending: "Endocrinology | Admitting: "Endocrinology

## 2016-04-28 DIAGNOSIS — E042 Nontoxic multinodular goiter: Secondary | ICD-10-CM | POA: Diagnosis not present

## 2016-04-30 ENCOUNTER — Encounter: Payer: Self-pay | Admitting: "Endocrinology

## 2016-04-30 ENCOUNTER — Ambulatory Visit (INDEPENDENT_AMBULATORY_CARE_PROVIDER_SITE_OTHER): Payer: Medicare HMO | Admitting: "Endocrinology

## 2016-04-30 VITALS — BP 133/80 | HR 80 | Ht 62.0 in | Wt 113.0 lb

## 2016-04-30 DIAGNOSIS — E039 Hypothyroidism, unspecified: Secondary | ICD-10-CM | POA: Diagnosis not present

## 2016-04-30 DIAGNOSIS — E042 Nontoxic multinodular goiter: Secondary | ICD-10-CM

## 2016-04-30 NOTE — Progress Notes (Signed)
Subjective:    Patient ID: Evelyn [name], female    DOB: May 13, 1941, PCP Terald Sleeper, PA-C   Past Medical History:  Diagnosis Date  . Aortic insufficiency   . CAD (coronary artery disease)    CABG LIMA to LAD,vein graft to diagonal,vein graft to OM  . Dyslipidemia   . GERD (gastroesophageal reflux disease)   . Hypothyroid    Past Surgical History:  Procedure Laterality Date  . BACK SURGERY  2009  . CORONARY ANGIOPLASTY  2003   cutting balloon atherectomy in-stent stenosis of the LAD.  [name] CORONARY ANGIOPLASTY WITH STENT PLACEMENT  2002   remote PCI & stenting LAD,  . CORONARY ARTERY BYPASS GRAFT  2004   LIMA to LAD,vein graft to diagonal,vein graft to OM  . LEFT HEART CATHETERIZATION WITH CORONARY ANGIOGRAM N/A 12/15/2011   Procedure: LEFT HEART CATHETERIZATION WITH CORONARY ANGIOGRAM;  Surgeon: Sanda Klein, MD;  Location: [facility];  Service: Cardiovascular;  Laterality: N/A;  . NM MYOCAR PERF WALL MOTION  12/01/2011   normal study-persistent exercise induced ECG changes raises the concern for ischemia (balanced ischemia).   Social History   Social History  . Marital status: Married    Spouse name: N/A  . Number of children: N/A  . Years of education: N/A   Social History Main Topics  . Smoking status: Never Smoker  . Smokeless tobacco: Never Used  . Alcohol use No  . Drug use: No  . Sexual activity: Not Asked   Other Topics Concern  . None   Social History Narrative   Mother of 2 with 4 grandchildren   Outpatient Encounter Prescriptions as of 04/30/2016  Medication Sig  . penicillin v potassium (VEETID) 500 MG tablet Take 500 mg by mouth 4 (four) times daily.  [name] ALPRAZolam (XANAX) 0.25 MG tablet Take 0.25 mg by mouth 3 (three) times daily as needed.   [name] aspirin EC 81 MG tablet Take 81 mg by mouth daily.  . furosemide (LASIX) 20 MG tablet Take 1 tablet (20 mg total) by mouth daily.  [name] HYDROcodone-acetaminophen (NORCO/VICODIN) 5-325 MG tablet Take 1  tablet by mouth every 6 (six) hours as needed for moderate pain.  [name] HYDROcodone-acetaminophen (NORCO/VICODIN) 5-325 MG tablet Take 1 tablet by mouth every 6 (six) hours as needed for moderate pain.  [name] levothyroxine (SYNTHROID, LEVOTHROID) 100 MCG tablet Take 1 tablet (100 mcg total) by mouth daily.  . metoprolol (LOPRESSOR) 100 MG tablet Take 1 tablet (100 mg) in  the morning and take 1/2  tablet (50 mg) in the  evening  . nitroGLYCERIN (NITROSTAT) 0.4 MG SL tablet Place 1 tablet (0.4 mg total) under the tongue every 5 (five) minutes as needed for chest pain.  . rosuvastatin (CRESTOR) 20 MG tablet Take 1 tablet (20 mg total) by mouth daily.  . Zoledronic Acid (RECLAST IV) Inject into the vein. Once yearly  . [DISCONTINUED] HYDROcodone-acetaminophen (NORCO/VICODIN) 5-325 MG tablet Take 1 tablet by mouth every 6 (six) hours as needed for moderate pain.   No facility-administered encounter medications on file as of 04/30/2016.    ALLERGIES: Allergies  Allergen Reactions  . Sulfa Antibiotics Swelling   VACCINATION STATUS: Immunization History  Administered Date(s) Administered  . Influenza, High Dose Seasonal PF 03/09/2016    HPI  75 year old female with medical history as above. She is being seen in f/u  For multinodular goiter confirmed by dedicated thyroid/neck ultrasound, after incidental finding of nodular goiter by a CT scan. -  She has history of hypothyroidism for the last 4 years currently on, levothyroxine 100 g by mouth every morning. - She denies family history of thyroid dysfunction. She feels occasional choking sensation in her neck, no recent voice change nor shortness of breath. She denies any exposure to neck radiation. - She denies any history of smoking.  Review of Systems Constitutional: She has steady weight, no fatigue, no subjective hyperthermia/hypothermia Eyes: no blurry vision, no xerophthalmia ENT: no sore throat, + nodules palpated in throat,  + intermittent  dysphagia/odynophagia, no hoarseness Cardiovascular: no CP/SOB/palpitations/leg swelling Respiratory: no cough/SOB Gastrointestinal: no N/V/D/C Musculoskeletal: no muscle/joint aches Skin: no rashes Neurological: no tremors/numbness/tingling/dizziness Psychiatric: no depression/anxiety  Objective:    BP 133/80   Pulse 80   Ht _0  (1.575 m)   Wt 113 lb (51.3 kg)   BMI 20.67 kg/m   Wt Readings from Last 3 Encounters:  04/30/16 113 lb (51.3 kg)  04/15/16 113 lb (51.3 kg)  04/12/16 113 lb (51.3 kg)    Physical Exam  Constitutional:  in NAD 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, strength intact in all 4 Skin: moist, warm, no rashes Neurological: no tremor with outstretched hands, DTR normal in all 4  CMP     Component Value Date/Time   NA 139 02/05/2016 1102   K 3.9 02/05/2016 1102   CL 100 02/05/2016 1102   CO2 24 02/05/2016 1102   GLUCOSE 86 02/05/2016 1102   GLUCOSE 90 07/11/2014 0913   BUN 7 (L) 02/05/2016 1102   CREATININE 0.60 02/05/2016 1102   CREATININE 0.59 07/11/2014 0913   CALCIUM 8.7 02/05/2016 1102   PROT 6.3 02/05/2016 1102   ALBUMIN 4.0 02/05/2016 1102   AST 13 02/05/2016 1102   ALT 10 02/05/2016 1102   ALKPHOS 56 02/05/2016 1102   BILITOT 0.5 02/05/2016 1102   GFRNONAA 88 02/05/2016 1102   GFRAA 102 02/05/2016 1102   Recent Results (from the past 2160 hour(s))  CMP14+EGFR     Status: Abnormal   Collection Time: 02/05/16 11:02 AM  Result Value Ref Range   Glucose 86 65 - 99 mg/dL   BUN 7 (L) 8 - 27 mg/dL   Creatinine, Ser 0.60 0.57 - 1.00 mg/dL   GFR calc non Af Amer 88 >59 mL/min/1.73   GFR calc Af Amer 102 >59 mL/min/1.73   BUN/Creatinine Ratio 12 12 - 28   Sodium 139 134 - 144 mmol/L   Potassium 3.9 3.5 -  5.2 mmol/L   Chloride 100 96 - 106 mmol/L   CO2 24 18 - 29 mmol/L   Calcium 8.7 8.7 - 10.3 mg/dL   Total Protein 6.3 6.0 - 8.5 g/dL   Albumin 4.0 3.5 - 4.8 g/dL   Globulin, Total 2.3 1.5 - 4.5 g/dL   Albumin/Globulin Ratio 1.7 1.2 - 2.2   Bilirubin Total 0.5 0.0 - 1.2 mg/dL   Alkaline Phosphatase 56 39 - 117 IU/L   AST 13 0 - 40 IU/L   ALT 10 0 - 32 IU/L  Thyroid Panel With TSH     Status: Abnormal   Collection Time: 03/09/16  9:56 AM  Result Value Ref Range   TSH 12.680 (H) 0.450 - 4.500 uIU/mL   T4, Total 6.0 4.5 - 12.0 ug/dL   T3 Uptake Ratio 22 (L) 24 - 39 %  Free Thyroxine Index 1.3 1.2 - 4.9  Lipid panel     Status: Abnormal   Collection Time: 04/15/16  3:40 PM  Result Value Ref Range   Cholesterol, Total 187 100 - 199 mg/dL   Triglycerides 118 0 - 149 mg/dL   HDL 52 >39 mg/dL   VLDL Cholesterol Cal 24 5 - 40 mg/dL   LDL Calculated 111 (H) 0 - 99 mg/dL   Chol/HDL Ratio 3.6 0.0 - 4.4 ratio units    Comment:                                   T. Chol/HDL Ratio                                             Men  Women                               1/2 Avg.Risk  3.4    3.3                                   Avg.Risk  5.0    4.4                                2X Avg.Risk  9.6    7.1                                3X Avg.Risk 23.4   11.0        Assessment & Plan:   1. Other specified hypothyroidism 2. Multinodular goiter - I have reviewed her available thyroid records as well as clinically evaluated patient. Regarding her hypothyroidism, we discussed the need for thyroid hormone for life. I will keep her on her current dose of levothyroxine 100 g by mouth every morning.  - We discussed about correct intake of levothyroxine, at fasting, with water, separated by at least 30 minutes from breakfast, and separated by more than 4 hours from calcium, iron, multivitamins, acid reflux medications (PPIs). -Patient is made aware of the fact that thyroid hormone replacement is needed  for life, dose to be adjusted by periodic monitoring of thyroid function tests.  - She was found to have clinical goiter last visit and one of her complaints was intermittent choking sensation in her neck. She just  underwent dedicated thyroid/neck ultrasound which is significant for multinodular goiter. Two nodules on the right (3.3 cm and 1.5 cm); one nodule on the left (2.8 cm)- all are suspicious for neoplastic process. She is approached for and agreed to fine needle aspiration of all 3 nodules. - This test will be scheduled to be done as soon as possible. She will return in 2 weeks with results. If the findings are suspicious, she will be approached for total thyroidectomy.  - I advised patient to maintain close follow up with Terald Sleeper, PA-C for primary care needs. Follow up plan: Return in about 2 weeks (around 05/14/2016) for biopsy results.  Glade Lloyd, MD Phone: [phone]  Fax: [phone]   04/30/2016, 11:32  AM

## 2016-05-08 ENCOUNTER — Other Ambulatory Visit: Payer: Self-pay | Admitting: Physician Assistant

## 2016-05-18 ENCOUNTER — Other Ambulatory Visit: Payer: Self-pay | Admitting: "Endocrinology

## 2016-05-18 ENCOUNTER — Telehealth: Payer: Self-pay

## 2016-05-18 DIAGNOSIS — E042 Nontoxic multinodular goiter: Secondary | ICD-10-CM

## 2016-05-18 NOTE — Telephone Encounter (Signed)
Pt notified of thyroid biopsy appt on 05/20/2016

## 2016-05-20 ENCOUNTER — Ambulatory Visit (HOSPITAL_COMMUNITY)
Admission: RE | Admit: 2016-05-20 | Discharge: 2016-05-20 | Disposition: A | Discharge status: Home / Self Care | Payer: Medicare HMO | Source: Ambulatory Visit | Attending: "Endocrinology | Admitting: "Endocrinology

## 2016-05-20 DIAGNOSIS — E042 Nontoxic multinodular goiter: Secondary | ICD-10-CM | POA: Insufficient documentation

## 2016-05-20 MED ORDER — LIDOCAINE HCL (PF) 2 % IJ SOLN
INTRAMUSCULAR | Status: AC
  Administered 2016-05-20: 20 mL
  Filled 2016-05-20: qty 20

## 2016-05-20 NOTE — Procedures (Signed)
PreOperative Dx: Multiple thyroid nodules Postoperative Dx: Multiple thyroid nodules Procedure:   US guided FNA of 3 thyroid nodules Radiologist:  Tyron RussellBoles Anesthesia:  5 ml of 2% lidocaine Specimen:  FNA x 3 of RIGHT superior nodule, FNA x 4 of RIGHT mid nodule, FNA x 3 of LEFT mid nodule  EBL:   < 1 ml Complications: None

## 2016-05-20 NOTE — Discharge Instructions (Signed)
Thyroid Biopsy °The thyroid gland is a butterfly-shaped gland located in the front of the neck. It produces hormones that affect metabolism, growth and development, and body temperature. Thyroid biopsy is a procedure in which small samples of tissue or fluid are removed from the thyroid gland. The samples are then looked at under a microscope to check for abnormalities. This procedure is done to determine the cause of thyroid problems. It may be done to check for infection, cancer, or other thyroid problems. °Two methods may be used for a thyroid biopsy. In one method, a thin needle is inserted through the skin and into the thyroid gland. In the other method, an open incision is made through the skin. °Tell a health care provider about: °· Any allergies you have. °· All medicines you are taking, including vitamins, herbs, eye drops, creams, and over-the-counter medicines. °· Any problems you or family members have had with anesthetic medicines. °· Any blood disorders you have. °· Any surgeries you have had. °· Any medical conditions you have. °What are the risks? °Generally, this is a safe procedure. However, problems can occur and include: °· Bleeding from the procedure site. °· Infection. °· Injury to structures near the thyroid gland. °What happens before the procedure? °· Ask your health care provider about: °¨ Changing or stopping your regular medicines. This is especially important if you are taking diabetes medicines or blood thinners. °¨ Taking medicines such as aspirin and ibuprofen. These medicines can thin your blood. Do not take these medicines before your procedure if your health care provider asks you not to. °· Do not eat or drink anything after midnight on the night before the procedure or as directed by your health care provider. °· You may have a blood sample taken. °What happens during the procedure? °Either of these methods may be used to perform a thyroid biopsy: °· Fine needle biopsy. You may  be given medicine to help you relax (sedative). You will be asked to lie on your back with your head tipped backward to extend your neck. An area on your neck will be cleaned. A needle will then be inserted through the skin of your neck. You may be asked to avoid coughing, talking, swallowing, or making sounds during some portions of the procedure. The needle will be withdrawn once the tissue or fluid samples have been removed. Pressure may be applied to your neck to reduce swelling and ensure that bleeding has stopped. The samples will be sent to a lab for examination. °· Open biopsy. You will be given medicine to make you sleep (general anesthetic). An incision will be made in your neck. A sample of thyroid tissue will be removed using surgical tools. The tissue sample will be sent for examination. In some cases, the sample may be examined during the biopsy. If that is done and cancer cells are found, some or all of the thyroid gland may be removed. The incision will be closed with stitches. °What happens after the procedure? °· Your recovery will be assessed and monitored. °· You may have soreness and tenderness at the site of the biopsy. This should go away after a few days. °· If you had an open biopsy, you may have a hoarse voice or sore throat for a couple days. °· It is your responsibility to get your test results. °This information is not intended to replace advice given to you by your health care provider. Make sure you discuss any questions you have with your health   care provider. °Document Released: 02/28/2007 Document Revised: 01/04/2016 Document Reviewed: 07/26/2013 °Elsevier Interactive Patient Education © 2017 [employer] ° °

## 2016-05-27 ENCOUNTER — Encounter: Payer: Self-pay | Admitting: "Endocrinology

## 2016-05-27 ENCOUNTER — Ambulatory Visit (INDEPENDENT_AMBULATORY_CARE_PROVIDER_SITE_OTHER): Payer: Medicare HMO | Admitting: "Endocrinology

## 2016-05-27 VITALS — BP 116/71 | HR 70 | Ht 62.0 in | Wt 111.0 lb

## 2016-05-27 DIAGNOSIS — E039 Hypothyroidism, unspecified: Secondary | ICD-10-CM | POA: Diagnosis not present

## 2016-05-27 DIAGNOSIS — E042 Nontoxic multinodular goiter: Secondary | ICD-10-CM

## 2016-05-27 NOTE — Progress Notes (Signed)
Subjective:    Patient ID: Evelyn [name], female    DOB: August 02, 1940, PCP Remus Loffler, PA-C   Past Medical History:  Diagnosis Date  . Aortic insufficiency   . CAD (coronary artery disease)    CABG LIMA to LAD,vein graft to diagonal,vein graft to OM  . Dyslipidemia   . GERD (gastroesophageal reflux disease)   . Hypothyroid    Past Surgical History:  Procedure Laterality Date  . BACK SURGERY  2009  . CORONARY ANGIOPLASTY  2003   cutting balloon atherectomy in-stent stenosis of the LAD.  [name] CORONARY ANGIOPLASTY WITH STENT PLACEMENT  2002   remote PCI & stenting LAD,  . CORONARY ARTERY BYPASS GRAFT  2004   LIMA to LAD,vein graft to diagonal,vein graft to OM  . LEFT HEART CATHETERIZATION WITH CORONARY ANGIOGRAM N/A 12/15/2011   Procedure: LEFT HEART CATHETERIZATION WITH CORONARY ANGIOGRAM;  Surgeon: Thurmon Fair, MD;  Location: [facility];  Service: Cardiovascular;  Laterality: N/A;  . NM MYOCAR PERF WALL MOTION  12/01/2011   normal study-persistent exercise induced ECG changes raises the concern for ischemia (balanced ischemia).   Social History   Social History  . Marital status: Married    Spouse name: N/A  . Number of children: N/A  . Years of education: N/A   Social History Main Topics  . Smoking status: Never Smoker  . Smokeless tobacco: Never Used  . Alcohol use No  . Drug use: No  . Sexual activity: Not Asked   Other Topics Concern  . None   Social History Narrative   Mother of 2 with 4 grandchildren   Outpatient Encounter Prescriptions as of 05/27/2016  Medication Sig  . ALPRAZolam (XANAX) 0.25 MG tablet TAKE 1 TABLET THREE TIMES A DAY  . aspirin EC 81 MG tablet Take 81 mg by mouth daily.  . furosemide (LASIX) 20 MG tablet Take 1 tablet (20 mg total) by mouth daily.  [name] HYDROcodone-acetaminophen (NORCO/VICODIN) 5-325 MG tablet Take 1 tablet by mouth every 6 (six) hours as needed for moderate pain.  [name] levothyroxine (SYNTHROID, LEVOTHROID) 100 MCG  tablet Take 1 tablet (100 mcg total) by mouth daily.  . metoprolol (LOPRESSOR) 100 MG tablet Take 1 tablet (100 mg) in  the morning and take 1/2  tablet (50 mg) in the  evening  . nitroGLYCERIN (NITROSTAT) 0.4 MG SL tablet Place 1 tablet (0.4 mg total) under the tongue every 5 (five) minutes as needed for chest pain.  [name] penicillin v potassium (VEETID) 500 MG tablet Take 500 mg by mouth 4 (four) times daily.  . rosuvastatin (CRESTOR) 20 MG tablet Take 1 tablet (20 mg total) by mouth daily.  . Zoledronic Acid (RECLAST IV) Inject into the vein. Once yearly  . [DISCONTINUED] HYDROcodone-acetaminophen (NORCO/VICODIN) 5-325 MG tablet Take 1 tablet by mouth every 6 (six) hours as needed for moderate pain.   No facility-administered encounter medications on file as of 05/27/2016.    ALLERGIES: Allergies  Allergen Reactions  . Sulfa Antibiotics Swelling   VACCINATION STATUS: Immunization History  Administered Date(s) Administered  . Influenza, High Dose Seasonal PF 03/09/2016    HPI  76 year old female with medical history as above. She is being seen in f/u  For multinodular goiter confirmed by dedicated thyroid/neck ultrasound, after incidental finding of nodular goiter by a CT scan. - She is status post FNA 3 nodules in her thyroid. - She has history of hypothyroidism for the last 4 years currently on, levothyroxine 100 g  by mouth every morning. - She denies family history of thyroid dysfunction. She feels occasional choking sensation in her neck, no recent voice change nor shortness of breath. She denies any exposure to neck radiation. - She denies any history of smoking.  Review of Systems Constitutional: She has steady weight, no fatigue, no subjective hyperthermia/hypothermia Eyes: no blurry vision, no xerophthalmia ENT: no sore throat, + nodules palpated in throat,  + intermittent dysphagia/odynophagia, no hoarseness Cardiovascular: no CP/SOB/palpitations/leg swelling Respiratory: no  cough/SOB Gastrointestinal: no N/V/D/C Musculoskeletal: no muscle/joint aches Skin: no rashes Neurological: no tremors/numbness/tingling/dizziness Psychiatric: no depression/anxiety  Objective:    BP 116/71   Pulse 70   Ht 5\' 2"  (1.575 m)   Wt 111 lb (50.3 kg)   BMI 20.30 kg/m   Wt Readings from Last 3 Encounters:  05/27/16 111 lb (50.3 kg)  04/30/16 113 lb (51.3 kg)  04/15/16 113 lb (51.3 kg)    Physical Exam  Constitutional:  in NAD 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, strength intact in all 4 Skin: moist, warm, no rashes Neurological: no tremor with outstretched hands, DTR normal in all 4  CMP     Component Value Date/Time   NA 139 02/05/2016 1102   K 3.9 02/05/2016 1102   CL 100 02/05/2016 1102   CO2 24 02/05/2016 1102   GLUCOSE 86 02/05/2016 1102   GLUCOSE 90 07/11/2014 0913   BUN 7 (L) 02/05/2016 1102   CREATININE 0.60 02/05/2016 1102   CREATININE 0.59 07/11/2014 0913   CALCIUM 8.7 02/05/2016 1102   PROT 6.3 02/05/2016 1102   ALBUMIN 4.0 02/05/2016 1102   AST 13 02/05/2016 1102   ALT 10 02/05/2016 1102   ALKPHOS 56 02/05/2016 1102   BILITOT 0.5 02/05/2016 1102   GFRNONAA 88 02/05/2016 1102   GFRAA 102 02/05/2016 1102   Recent Results (from the past 2160 hour(s))  Thyroid Panel With TSH     Status: Abnormal   Collection Time: 03/09/16  9:56 AM  Result Value Ref Range   TSH 12.680 (H) 0.450 - 4.500 uIU/mL   T4, Total 6.0 4.5 - 12.0 ug/dL   T3 Uptake Ratio 22 (L) 24 - 39 %   Free Thyroxine Index 1.3 1.2 - 4.9  Lipid panel     Status: Abnormal   Collection Time: 04/15/16  3:40 PM  Result Value Ref Range   Cholesterol, Total 187 100 - 199 mg/dL   Triglycerides 161 0 - 149 mg/dL   HDL 52 >09 mg/dL   VLDL Cholesterol  Cal 24 5 - 40 mg/dL   LDL Calculated 604 (H) 0 - 99 mg/dL   Chol/HDL Ratio 3.6 0.0 - 4.4 ratio units    Comment:                                   T. Chol/HDL Ratio                                             Men  Women  1/2 Avg.Risk  3.4    3.3                                   Avg.Risk  5.0    4.4                                2X Avg.Risk  9.6    7.1                                3X Avg.Risk 23.4   11.0    Fine-needle aspiration samples  of 3 nodules in her thyroid is benign   Assessment & Plan:   1. Other specified hypothyroidism 2. Multinodular goiter  Regarding her hypothyroidism, we discussed the need for thyroid hormone for life. I will keep her on her current dose of levothyroxine 100 g by mouth every morning.  - We discussed about correct intake of levothyroxine, at fasting, with water, separated by at least 30 minutes from breakfast, and separated by more than 4 hours from calcium, iron, multivitamins, acid reflux medications (PPIs). -Patient is made aware of the fact that thyroid hormone replacement is needed for life, dose to be adjusted by periodic monitoring of thyroid function tests.  - She was found to have clinical goiter last visit and one of her complaints was intermittent choking sensation in her neck. She just  underwent dedicated thyroid/neck ultrasound which is significant for multinodular goiter. Two nodules on the right (3.3 cm and 1.5 cm); one nodule on the left (2.8 cm) - Biopsy off or 3 nodules is negative for malignancy. He will not need intervention at this time. She may need repeat ultrasound 1-2 years from now. - I advised patient to maintain close follow up with Remus LofflerAngel S Jones, PA-C for primary care needs. Follow up plan: Return in about 6 months (around 11/24/2016) for follow up with pre-visit labs.  Marquis LunchGebre Donta Mcinroy, MD Phone: [phone]  Fax: [phone]   05/27/2016, 11:27 AM

## 2016-06-12 ENCOUNTER — Other Ambulatory Visit: Payer: Self-pay | Admitting: Physician Assistant

## 2016-07-14 ENCOUNTER — Ambulatory Visit: Payer: Medicare HMO | Admitting: Physician Assistant

## 2016-07-15 ENCOUNTER — Ambulatory Visit: Payer: Medicare HMO | Admitting: "Endocrinology

## 2016-07-21 ENCOUNTER — Ambulatory Visit (INDEPENDENT_AMBULATORY_CARE_PROVIDER_SITE_OTHER): Payer: Medicare HMO | Admitting: Physician Assistant

## 2016-07-21 ENCOUNTER — Encounter: Payer: Self-pay | Admitting: Physician Assistant

## 2016-07-21 VITALS — BP 113/58 | HR 56 | Temp 97.8°F | Ht 62.0 in | Wt 112.2 lb

## 2016-07-21 DIAGNOSIS — S22080D Wedge compression fracture of T11-T12 vertebra, subsequent encounter for fracture with routine healing: Secondary | ICD-10-CM

## 2016-07-21 DIAGNOSIS — I10 Essential (primary) hypertension: Secondary | ICD-10-CM

## 2016-07-21 DIAGNOSIS — I25728 Atherosclerosis of autologous artery coronary artery bypass graft(s) with other forms of angina pectoris: Secondary | ICD-10-CM | POA: Diagnosis not present

## 2016-07-21 DIAGNOSIS — E039 Hypothyroidism, unspecified: Secondary | ICD-10-CM

## 2016-07-21 DIAGNOSIS — R69 Illness, unspecified: Secondary | ICD-10-CM | POA: Diagnosis not present

## 2016-07-21 DIAGNOSIS — F419 Anxiety disorder, unspecified: Secondary | ICD-10-CM | POA: Diagnosis not present

## 2016-07-21 MED ORDER — HYDROCODONE-ACETAMINOPHEN 5-325 MG PO TABS: 1.0000 | ORAL_TABLET | Freq: Four times a day (QID) | ORAL | 0 refills | Status: DC | PRN

## 2016-07-21 MED ORDER — ALPRAZOLAM 0.25 MG PO TABS: 0.2500 mg | ORAL_TABLET | Freq: Three times a day (TID) | ORAL | 1 refills | Status: DC

## 2016-07-21 NOTE — Progress Notes (Signed)
BP (!) 113/58   Pulse (!) 56   Temp 97.8 F (36.6 C) (Oral)   Ht 5' 2"  (1.575 m)   Wt 112 lb 3.2 oz (50.9 kg)   BMI 20.52 kg/m    Subjective:    Patient ID: Evelyn [name], female    DOB: 1940/08/21, 76 y.o.   MRN: [id number]  HPI: Evelyn [name] is a 76 y.o. 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   All medications are reviewed today. There are no reports of any problems with the medications. All of the medical conditions are reviewed and updated.  Lab work is reviewed and will be ordered as medically necessary. There are no new problems reported with today's visit.   Relevant past medical, surgical, family and social history reviewed and updated as indicated. Allergies and medications reviewed and updated.  Past Medical History:  Diagnosis Date  . Aortic insufficiency   . CAD (coronary artery disease)    CABG LIMA to LAD,vein graft to diagonal,vein graft to OM  . Dyslipidemia   . GERD (gastroesophageal reflux disease)   . Hypothyroid     Past Surgical History:  Procedure Laterality Date  . BACK SURGERY  2009  . CORONARY ANGIOPLASTY  2003   cutting balloon atherectomy in-stent stenosis of the LAD.  [name] CORONARY ANGIOPLASTY WITH STENT PLACEMENT  2002   remote PCI & stenting LAD,  . CORONARY ARTERY BYPASS GRAFT  2004   LIMA to LAD,vein graft to diagonal,vein graft to OM  . LEFT HEART CATHETERIZATION WITH CORONARY ANGIOGRAM N/A 12/15/2011   Procedure: LEFT HEART CATHETERIZATION WITH CORONARY ANGIOGRAM;  Surgeon: Sanda Klein, MD;  Location: [facility];  Service: Cardiovascular;  Laterality: N/A;  . NM MYOCAR PERF WALL MOTION  12/01/2011   normal study-persistent exercise induced ECG changes raises the concern for ischemia (balanced ischemia).    Review  of Systems  Constitutional: Negative.  Negative for activity change, fatigue and fever.  HENT: Negative.   Eyes: Negative.   Respiratory: Negative.  Negative for cough.   Cardiovascular: Negative.  Negative for chest pain.  Gastrointestinal: Negative.  Negative for abdominal pain.  Endocrine: Negative.   Genitourinary: Negative.  Negative for dysuria.  Musculoskeletal: Positive for arthralgias and back pain.  Skin: Negative.   Neurological: Negative.   Psychiatric/Behavioral: The patient is nervous/anxious.     Allergies as of 07/21/2016      Reactions   Sulfa Antibiotics Swelling      Medication List       Accurate as of 07/21/16 11:42 AM. Always use your most recent med list.          ALPRAZolam 0.25 MG tablet Commonly known as:  XANAX Take 1 tablet (0.25 mg total) by mouth 3 (three) times daily.   aspirin EC 81 MG tablet Take 81 mg by mouth daily.   furosemide 20 MG tablet Commonly known as:  LASIX Take 1 tablet (20 mg total) by mouth daily.   HYDROcodone-acetaminophen 5-325 MG tablet Commonly known as:  NORCO/VICODIN Take 1 tablet by mouth every 6 (six) hours as needed for moderate pain.   HYDROcodone-acetaminophen 5-325 MG tablet Commonly known as:  NORCO/VICODIN Take 1 tablet by mouth every 6 (six) hours as needed for moderate pain.   levothyroxine 100 MCG tablet Commonly known as:  SYNTHROID, LEVOTHROID TAKE 1 TABLET (  100 MCG TOTAL) BY MOUTH DAILY.   metoprolol 100 MG tablet Commonly known as:  LOPRESSOR Take 1 tablet (100 mg) in  the morning and take 1/2  tablet (50 mg) in the  evening   nitroGLYCERIN 0.4 MG SL tablet Commonly known as:  NITROSTAT Place 1 tablet (0.4 mg total) under the tongue every 5 (five) minutes as needed for chest pain.   penicillin v potassium 500 MG tablet Commonly known as:  VEETID Take 500 mg by mouth 4 (four) times daily.   RECLAST IV Inject into the vein. Once yearly   rosuvastatin 20 MG tablet Commonly known as:   CRESTOR Take 1 tablet (20 mg total) by mouth daily.          Objective:    BP (!) 113/58   Pulse (!) 56   Temp 97.8 F (36.6 C) (Oral)   Ht 5' 2"  (1.575 m)   Wt 112 lb 3.2 oz (50.9 kg)   BMI 20.52 kg/m   Allergies  Allergen Reactions  . Sulfa Antibiotics Swelling    Physical Exam  Constitutional: She is oriented to person, place, and time. She appears well-developed and well-nourished.  HENT:  Head: Normocephalic and atraumatic.  Right Ear: Tympanic membrane, external ear and ear canal normal.  Left Ear: Tympanic membrane, external ear and ear canal normal.  Nose: Nose normal. No rhinorrhea.  Mouth/Throat: Oropharynx is clear and moist and mucous membranes are normal. No oropharyngeal exudate or posterior oropharyngeal erythema.  Eyes: Conjunctivae and EOM are normal. Pupils are equal, round, and reactive to light.  Neck: Normal range of motion. Neck supple.  Cardiovascular: Normal rate, regular rhythm, normal heart sounds and intact distal pulses.   Pulmonary/Chest: Effort normal and breath sounds normal.  Abdominal: Soft. Bowel sounds are normal.  Neurological: She is alert and oriented to person, place, and time. She has normal reflexes.  Skin: Skin is warm and dry. No rash noted.  Psychiatric: She has a normal mood and affect. Her behavior is normal. Judgment and thought content normal.  Nursing note and vitals reviewed.       Assessment & Plan:   1. Essential hypertension - Lipid panel - CMP14+EGFR  2. Closed wedge compression fracture of eleventh thoracic vertebra with routine healing, subsequent encounter - HYDROcodone-acetaminophen (NORCO/VICODIN) 5-325 MG tablet; Take 1 tablet by mouth every 6 (six) hours as needed for moderate pain.  Dispense: 120 tablet; Refill: 0 - HYDROcodone-acetaminophen (NORCO/VICODIN) 5-325 MG tablet; Take 1 tablet by mouth every 6 (six) hours as needed for moderate pain.  Dispense: 120 tablet; Refill: 0  3. Anxiety - ALPRAZolam  (XANAX) 0.25 MG tablet; Take 1 tablet (0.25 mg total) by mouth 3 (three) times daily.  Dispense: 90 tablet; Refill: 1  4. 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 Acquired hypothyroidism - T4, Free - TSH   Continue all other maintenance medications as listed above.  Follow up plan: Return in about 4 months (around 11/20/2016) for recheck.  Educational handout given for hypothyroidism  Terald Sleeper PA-C [facility] [address]  [place],  [address] [phone]   07/21/2016, 11:42 AM

## 2016-07-21 NOTE — Patient Instructions (Signed)
Hypothyroidism Hypothyroidism is a disorder of the thyroid. The thyroid is a large gland that is located in the lower front of the neck. The thyroid releases hormones that control how the body works. With hypothyroidism, the thyroid does not make enough of these hormones. What are the causes? Causes of hypothyroidism may include:  Viral infections.  Pregnancy.  Your own defense system (immune system) attacking your thyroid.  Certain medicines.  Birth defects.  Past radiation treatments to your head or neck.  Past treatment with radioactive iodine.  Past surgical removal of part or all of your thyroid.  Problems with the gland that is located in the center of your brain (pituitary).  What are the signs or symptoms? Signs and symptoms of hypothyroidism may include:  Feeling as though you have no energy (lethargy).  Inability to tolerate cold.  Weight gain that is not explained by a change in diet or exercise habits.  Dry skin.  Coarse hair.  Menstrual irregularity.  Slowing of thought processes.  Constipation.  Sadness or depression.  How is this diagnosed? Your health care provider may diagnose hypothyroidism with blood tests and ultrasound tests. How is this treated? Hypothyroidism is treated with medicine that replaces the hormones that your body does not make. After you begin treatment, it may take several weeks for symptoms to go away. Follow these instructions at home:  Take medicines only as directed by your health care provider.  If you start taking any new medicines, tell your health care provider.  Keep all follow-up visits as directed by your health care provider. This is important. As your condition improves, your dosage needs may change. You will need to have blood tests regularly so that your health care provider can watch your condition. Contact a health care provider if:  Your symptoms do not get better with treatment.  You are taking thyroid  replacement medicine and: ? You sweat excessively. ? You have tremors. ? You feel anxious. ? You lose weight rapidly. ? You cannot tolerate heat. ? You have emotional swings. ? You have diarrhea. ? You feel weak. Get help right away if:  You develop chest pain.  You develop an irregular heartbeat.  You develop a rapid heartbeat. This information is not intended to replace advice given to you by your health care provider. Make sure you discuss any questions you have with your health care provider. Document Released: 05/03/2005 Document Revised: 10/09/2015 Document Reviewed: 09/18/2013 Elsevier Interactive Patient Education  2017 Elsevier Inc.  

## 2016-07-22 LAB — T4, FREE: Free T4: 1.66 ng/dL (ref 0.82–1.77)

## 2016-07-22 LAB — CMP14+EGFR
ALBUMIN: 4.1 g/dL (ref 3.5–4.8)
ALT: 13 IU/L (ref 0–32)
AST: 12 IU/L (ref 0–40)
Albumin/Globulin Ratio: 1.8 (ref 1.2–2.2)
Alkaline Phosphatase: 55 IU/L (ref 39–117)
BUN / CREAT RATIO: 16 (ref 12–28)
BUN: 8 mg/dL (ref 8–27)
Bilirubin Total: 0.4 mg/dL (ref 0.0–1.2)
CALCIUM: 9.3 mg/dL (ref 8.7–10.3)
CO2: 25 mmol/L (ref 18–29)
Chloride: 104 mmol/L (ref 96–106)
Creatinine, Ser: 0.51 mg/dL — ABNORMAL LOW (ref 0.57–1.00)
GFR, EST AFRICAN AMERICAN: 107 mL/min/{1.73_m2} (ref >59)
GFR, EST NON AFRICAN AMERICAN: 93 mL/min/{1.73_m2} (ref >59)
GLOBULIN, TOTAL: 2.3 g/dL (ref 1.5–4.5)
Glucose: 86 mg/dL (ref 65–99)
Potassium: 4.6 mmol/L (ref 3.5–5.2)
Sodium: 142 mmol/L (ref 134–144)
TOTAL PROTEIN: 6.4 g/dL (ref 6.0–8.5)

## 2016-07-22 LAB — LIPID PANEL
CHOL/HDL RATIO: 2.8 ratio (ref 0.0–4.4)
CHOLESTEROL TOTAL: 157 mg/dL (ref 100–199)
HDL: 56 mg/dL (ref >39)
LDL Calculated: 86 mg/dL (ref 0–99)
Triglycerides: 76 mg/dL (ref 0–149)
VLDL Cholesterol Cal: 15 mg/dL (ref 5–40)

## 2016-07-22 LAB — TSH: TSH: 0.066 u[IU]/mL — ABNORMAL LOW (ref 0.450–4.500)

## 2016-07-23 MED ORDER — LEVOTHYROXINE SODIUM 88 MCG PO TABS: 88.0000 ug | ORAL_TABLET | Freq: Every day | ORAL | 3 refills | Status: DC

## 2016-07-23 NOTE — Addendum Note (Signed)
Addended by: Almeta MonasSTONE, JANIE M on: 07/23/2016 05:40 PM   Modules accepted: Orders

## 2016-08-18 ENCOUNTER — Telehealth: Payer: Self-pay | Admitting: Physician Assistant

## 2016-08-18 NOTE — Telephone Encounter (Signed)
Patient aware to return in 2 months to re check TSH and Free T4.

## 2016-09-01 ENCOUNTER — Emergency Department (HOSPITAL_COMMUNITY)
Admission: EM | Admit: 2016-09-01 | Discharge: 2016-09-01 | Disposition: A | Discharge status: Home / Self Care | Payer: Medicare HMO | Attending: Emergency Medicine | Admitting: Emergency Medicine

## 2016-09-01 ENCOUNTER — Emergency Department (HOSPITAL_COMMUNITY): Payer: Medicare HMO

## 2016-09-01 ENCOUNTER — Telehealth: Payer: Self-pay | Admitting: *Deleted

## 2016-09-01 ENCOUNTER — Encounter (HOSPITAL_COMMUNITY): Payer: Self-pay | Admitting: Emergency Medicine

## 2016-09-01 DIAGNOSIS — Z8673 Personal history of transient ischemic attack (TIA), and cerebral infarction without residual deficits: Secondary | ICD-10-CM | POA: Insufficient documentation

## 2016-09-01 DIAGNOSIS — R42 Dizziness and giddiness: Secondary | ICD-10-CM | POA: Diagnosis not present

## 2016-09-01 DIAGNOSIS — R072 Precordial pain: Secondary | ICD-10-CM

## 2016-09-01 DIAGNOSIS — Z951 Presence of aortocoronary bypass graft: Secondary | ICD-10-CM | POA: Insufficient documentation

## 2016-09-01 DIAGNOSIS — I10 Essential (primary) hypertension: Secondary | ICD-10-CM | POA: Diagnosis not present

## 2016-09-01 DIAGNOSIS — Z7982 Long term (current) use of aspirin: Secondary | ICD-10-CM | POA: Insufficient documentation

## 2016-09-01 DIAGNOSIS — G459 Transient cerebral ischemic attack, unspecified: Secondary | ICD-10-CM | POA: Diagnosis not present

## 2016-09-01 DIAGNOSIS — I2510 Atherosclerotic heart disease of native coronary artery without angina pectoris: Secondary | ICD-10-CM | POA: Insufficient documentation

## 2016-09-01 DIAGNOSIS — E039 Hypothyroidism, unspecified: Secondary | ICD-10-CM | POA: Insufficient documentation

## 2016-09-01 DIAGNOSIS — R0789 Other chest pain: Secondary | ICD-10-CM | POA: Diagnosis not present

## 2016-09-01 DIAGNOSIS — Z79899 Other long term (current) drug therapy: Secondary | ICD-10-CM | POA: Diagnosis not present

## 2016-09-01 DIAGNOSIS — R079 Chest pain, unspecified: Secondary | ICD-10-CM | POA: Diagnosis present

## 2016-09-01 HISTORY — DX: Aneurysm of unspecified site: I72.9

## 2016-09-01 LAB — I-STAT TROPONIN, ED: TROPONIN I, POC: 0.01 ng/mL (ref 0.00–0.08)

## 2016-09-01 LAB — DIFFERENTIAL
BASOS PCT: 0 %
Basophils Absolute: 0 10*3/uL (ref 0.0–0.1)
EOS PCT: 1 %
Eosinophils Absolute: 0.1 10*3/uL (ref 0.0–0.7)
LYMPHS ABS: 2.7 10*3/uL (ref 0.7–4.0)
LYMPHS PCT: 30 %
Monocytes Absolute: 0.6 10*3/uL (ref 0.1–1.0)
Monocytes Relative: 7 %
NEUTROS PCT: 62 %
Neutro Abs: 5.7 10*3/uL (ref 1.7–7.7)

## 2016-09-01 LAB — BASIC METABOLIC PANEL
Anion gap: 8 (ref 5–15)
BUN: 10 mg/dL (ref 6–20)
CALCIUM: 9.3 mg/dL (ref 8.9–10.3)
CO2: 23 mmol/L (ref 22–32)
Chloride: 105 mmol/L (ref 101–111)
Creatinine, Ser: 0.63 mg/dL (ref 0.44–1.00)
GFR calc Af Amer: >60 mL/min (ref >60)
Glucose, Bld: 99 mg/dL (ref 65–99)
Potassium: 4.4 mmol/L (ref 3.5–5.1)
Sodium: 136 mmol/L (ref 135–145)

## 2016-09-01 LAB — PROTIME-INR
INR: 1.04
Prothrombin Time: 13.6 seconds (ref 11.4–15.2)

## 2016-09-01 LAB — URINALYSIS, MICROSCOPIC (REFLEX)

## 2016-09-01 LAB — CBC
HCT: 38 % (ref 36.0–46.0)
Hemoglobin: 12.5 g/dL (ref 12.0–15.0)
MCH: 30.3 pg (ref 26.0–34.0)
MCHC: 32.9 g/dL (ref 30.0–36.0)
MCV: 92 fL (ref 78.0–100.0)
PLATELETS: 238 10*3/uL (ref 150–400)
RBC: 4.13 MIL/uL (ref 3.87–5.11)
RDW: 14.4 % (ref 11.5–15.5)
WBC: 9.8 10*3/uL (ref 4.0–10.5)

## 2016-09-01 LAB — URINALYSIS, ROUTINE W REFLEX MICROSCOPIC
Bilirubin Urine: NEGATIVE
GLUCOSE, UA: NEGATIVE mg/dL
Ketones, ur: NEGATIVE mg/dL
Nitrite: NEGATIVE
PH: 5.5 (ref 5.0–8.0)
Protein, ur: NEGATIVE mg/dL

## 2016-09-01 LAB — APTT: aPTT: 37 seconds — ABNORMAL HIGH (ref 24–36)

## 2016-09-01 NOTE — ED Provider Notes (Signed)
Pt feels better. MRI without significant abnormality.  Primary care follow-up.  She understands to return to the ER for new or worsening symptoms  Dg Chest 2 View  Result Date: 09/01/2016 CLINICAL DATA:  Chest tightness, dizziness, and lightheadedness. History of hypertension. EXAM: CHEST  2 VIEW COMPARISON:  CT chest 02/27/2016 FINDINGS: Postoperative changes in the mediastinum and cervical spine. Normal heart size and pulmonary vascularity. No focal airspace disease or consolidation in the lungs. No blunting of costophrenic angles. No pneumothorax. Mediastinal contours appear intact. Tortuous aorta. IMPRESSION: No active cardiopulmonary disease. Electronically Signed   By: Burman Nieves M.D.   On: 09/01/2016 06:54   Mr Brain Wo Contrast  Result Date: 09/01/2016 CLINICAL DATA:  76 year old female who awoke with dizziness and nausea. Symptoms now resolved. EXAM: MRI HEAD WITHOUT CONTRAST TECHNIQUE: Multiplanar, multiecho pulse sequences of the brain and surrounding structures were obtained without intravenous contrast. COMPARISON:  Cervical spine MRI 06/12/2007 FINDINGS: Brain: No restricted diffusion to suggest acute infarction. No midline shift, mass effect, evidence of mass lesion, ventriculomegaly, extra-axial collection or acute intracranial hemorrhage. Cervicomedullary junction and pituitary are within normal limits. Wallace Cullens and white matter signal is within normal limits for age throughout the brain. No cortical encephalomalacia. No chronic cerebral blood products. Deep gray matter nuclei, brainstem and cerebellum appear normal. Vascular: Major intracranial vascular flow voids are preserved. Mild intracranial artery tortuosity. Skull and upper cervical spine: Partially visible cervical ACDF hardware artifact. Chronic C3-C4 disc and endplate degeneration. Normal bone marrow signal. Sinuses/Orbits: Normal orbits soft tissues. Visualized paranasal sinuses and mastoids are well pneumatized. Other: Visible  internal auditory structures appear normal. Negative scalp soft tissues. IMPRESSION: 1. No acute intracranial abnormality. Normal for age noncontrast MRI appearance of the brain. 2. Chronic cervical spine degeneration. Partially visible cervical ACDF. Electronically Signed   By: Odessa Fleming M.D.   On: 09/01/2016 09:42      Azalia Bilis, MD 09/01/16 1032

## 2016-09-01 NOTE — ED Notes (Signed)
Patient to MRI.

## 2016-09-01 NOTE — Telephone Encounter (Signed)
appt made for tomorrow at 10;40

## 2016-09-01 NOTE — ED Notes (Signed)
Patient transported to X-ray 

## 2016-09-01 NOTE — Telephone Encounter (Signed)
-----   Message from Remus Loffler, PA-C sent at 09/01/2016  8:21 AM EDT ----- Regarding: RE: outpatient TIA workup Thank you and I will be letting our office staff get this started. Prudy Feeler, PA-C ----- Message ----- From: Derwood Kaplan, MD Sent: 09/01/2016   8:16 AM To: Remus Loffler, PA-C Subject: outpatient TIA workup                          Hi Ms. Yetta Barre,  Ms. Humbarger was in the Emergency room for dizziness. Her symptoms have now resolved, but had lasted for several minutes. We are getting an emergent MRI to ensure there is no stroke. If there is no stroke, Ms. Bevel was given the option for TIA admission vs. Outpatient workup - and we discussed the pros and cons for each option and she has elected to get outpatient workup completed. Will you kindly get her an appointment this week.  Thank you,  Ankit N[facility]

## 2016-09-01 NOTE — ED Provider Notes (Signed)
[facility]-EMERGENCY DEPT Provider Note   CSN: [id number] Arrival date & time: 09/01/16  4098     History   Chief Complaint Chief Complaint  Patient presents with  . Chest Pain    HPI Evelyn [name] is a 76 y.o. female.  HPI Pt comes in with cc of dizziness. Pt has hx of CAD, HTN, HL, ? TIA in the past. She reports that when she woke up (last normal 10 pm) she noted that she was significantly dizzy. Dizziness is  described as lightheadedness that was constant. Pt reports that she felt unsteady getting up and the symptoms continued while she was laying flat. Pt's symptoms improved en route to the ER, but were present for at least an hour. Pt had nausea, otherwise, no numbness, tingling, weakness, vision changes.   Past Medical History:  Diagnosis Date  . Aneurysm (HCC)   . Aortic insufficiency   . CAD (coronary artery disease)    CABG LIMA to LAD,vein graft to diagonal,vein graft to OM  . Dyslipidemia   . GERD (gastroesophageal reflux disease)   . Hypothyroid     Patient Active Problem List   Diagnosis Date Noted  . Anxiety 07/21/2016  . Multinodular goiter 03/09/2016  . 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 Wedge compression fracture of T11 vertebra with routine healing 03/09/2016  . Ascending aortic aneurysm (HCC) 01/17/2016  . Aortic insufficiency 09/16/2013  . HTN (hypertension) 09/16/2013  . Possible TIA (transient ischemic attack) 09/16/2013  . Unstable angina (HCC) 12/15/2011  . Abnormal nuclear cardiac imaging test 12/15/2011  . CAD, PCI '02, '04. CABG X 3 2004 12/15/2011  . Dyslipidemia 12/15/2011  . Acquired hypothyroidism 12/15/2011    Past Surgical History:  Procedure Laterality Date  . BACK SURGERY  2009  . CORONARY ANGIOPLASTY  2003   cutting balloon atherectomy in-stent stenosis of the LAD.  [name] CORONARY ANGIOPLASTY WITH STENT PLACEMENT  2002   remote PCI & stenting LAD,  . CORONARY ARTERY BYPASS GRAFT  2004   LIMA to LAD,vein graft to diagonal,vein  graft to OM  . LEFT HEART CATHETERIZATION WITH CORONARY ANGIOGRAM N/A 12/15/2011   Procedure: LEFT HEART CATHETERIZATION WITH CORONARY ANGIOGRAM;  Surgeon: Thurmon Fair, MD;  Location: [facility];  Service: Cardiovascular;  Laterality: N/A;  . NM MYOCAR PERF WALL MOTION  12/01/2011   normal study-persistent exercise induced ECG changes raises the concern for ischemia (balanced ischemia).    OB History    No data available       Home Medications    Prior to Admission medications   Medication Sig Start Date End Date Taking? Authorizing Provider  ALPRAZolam (XANAX) 0.25 MG tablet Take 1 tablet (0.25 mg total) by mouth 3 (three) times daily. 07/21/16   Remus Loffler, PA-C  aspirin EC 81 MG tablet Take 81 mg by mouth daily.    Historical Provider, MD  furosemide (LASIX) 20 MG tablet Take 1 tablet (20 mg total) by mouth daily. 02/16/16   Mihai Croitoru, MD  HYDROcodone-acetaminophen (NORCO/VICODIN) 5-325 MG tablet Take 1 tablet by mouth every 6 (six) hours as needed for moderate pain. 07/21/16   Remus Loffler, PA-C  HYDROcodone-acetaminophen (NORCO/VICODIN) 5-325 MG tablet Take 1 tablet by mouth every 6 (six) hours as needed for moderate pain. 07/21/16   Remus Loffler, PA-C  levothyroxine (SYNTHROID, LEVOTHROID) 88 MCG tablet Take 1 tablet (88 mcg total) by mouth daily. 07/23/16   Remus Loffler, PA-C  metoprolol (LOPRESSOR) 100 MG tablet Take 1  tablet (100 mg) in  the morning and take 1/2  tablet (50 mg) in the  evening 04/24/15   Mihai Croitoru, MD  nitroGLYCERIN (NITROSTAT) 0.4 MG SL tablet Place 1 tablet (0.4 mg total) under the tongue every 5 (five) minutes as needed for chest pain. 01/16/16   Mihai Croitoru, MD  penicillin v potassium (VEETID) 500 MG tablet Take 500 mg by mouth 4 (four) times daily.    Historical Provider, MD  rosuvastatin (CRESTOR) 20 MG tablet Take 1 tablet (20 mg total) by mouth daily. 04/15/16   Remus Loffler, PA-C  Zoledronic Acid (RECLAST IV) Inject into the vein. Once yearly     Historical Provider, MD    Family History Family History  Problem Relation Age of Onset  . Heart attack Mother   . Heart failure Father   . Hypertension Father   . Asthma Maternal Grandmother     Social History Social History  Substance Use Topics  . Smoking status: Never Smoker  . Smokeless tobacco: Never Used  . Alcohol use No     Allergies   Sulfa antibiotics   Review of Systems Review of Systems  Neurological: Positive for dizziness.  All other systems reviewed and are negative.    Physical Exam Updated Vital Signs BP (!) 128/54   Pulse (!) 50   Temp 97.6 F (36.4 C)   Resp 11   Ht  (1.575 m)   Wt 108 lb (49 kg)   SpO2 99%   BMI 19.75 kg/m   Physical Exam  Constitutional: She is oriented to person, place, and time. She appears well-developed and well-nourished.  HENT:  Head: Normocephalic and atraumatic.  Eyes: EOM are normal. Pupils are equal, round, and reactive to light.  Neck: Neck supple.  Cardiovascular: Normal rate, regular rhythm and normal heart sounds.   No murmur heard. Pulmonary/Chest: Effort normal. No respiratory distress.  Abdominal: Soft. She exhibits no distension. There is no tenderness. There is no rebound and no guarding.  Neurological: She is alert and oriented to person, place, and time. No cranial nerve deficit. Coordination normal.  Cerebellar exam is normal (finger to nose) Sensory exam normal for bilateral upper and lower extremities - and patient is able to discriminate between sharp and dull. Motor exam is 4+/5   Skin: Skin is warm and dry.  Nursing note and vitals reviewed.    ED Treatments / Results  Labs (all labs ordered are listed, but only abnormal results are displayed) Labs Reviewed  APTT - Abnormal; Notable for the following:       Result Value   aPTT 37 (*)    All other components within normal limits  BASIC METABOLIC PANEL  CBC  PROTIME-INR  DIFFERENTIAL  URINALYSIS, ROUTINE W REFLEX  MICROSCOPIC  I-STAT TROPOININ, ED    EKG  EKG Interpretation  Date/Time:  Wednesday September 01 2016 05:44:03 EDT Ventricular Rate:  56 PR Interval:    QRS Duration: 93 QT Interval:  427 QTC Calculation: 413 R Axis:   59 Text Interpretation:  Sinus rhythm Nonspecific T abnormalities, anterior leads ST depression in the lateral leads No acute changes Confirmed by Rhunette Croft, MD, Janey Genta ([id number]) on 09/01/2016 5:55:59 AM Also confirmed by Rhunette Croft, MD, Janey Genta [id number]), editor Misty Stanley [phone])  on 09/01/2016 6:52:41 AM       Radiology Dg Chest 2 View  Result Date: 09/01/2016 CLINICAL DATA:  Chest tightness, dizziness, and lightheadedness. History of hypertension. EXAM: CHEST  2 VIEW COMPARISON:  CT  chest 02/27/2016 FINDINGS: Postoperative changes in the mediastinum and cervical spine. Normal heart size and pulmonary vascularity. No focal airspace disease or consolidation in the lungs. No blunting of costophrenic angles. No pneumothorax. Mediastinal contours appear intact. Tortuous aorta. IMPRESSION: No active cardiopulmonary disease. Electronically Signed   By: Burman Nieves M.D.   On: 09/01/2016 06:54    Procedures Procedures (including critical care time)  Medications Ordered in ED Medications - No data to display   Initial Impression / Assessment and Plan / ED Course  I have reviewed the triage vital signs and the nursing notes.  Pertinent labs & imaging results that were available during my care of the patient were reviewed by me and considered in my medical decision making (see chart for details).     Pt comes in with cc of dizziness. Clinical concerns for TIA - as the symptoms have resolved and the neuro exam is nonfocal. No nystagmus. ABCD2 score is 4. MRI ordered - if neg pt prefers outpatient f/u with PCP, Ms. Yetta Barre for rest of the workup. I discussed with her the ABCD2 score and the prognosis (5% stroke risk factor in a week), and also discussed strict ER return  precautions. Pt will return to the ER if there is return of dizziness or any neuro symptoms. Daughter lives nearby and pt has been asked to be checked by someone frequently. I have also emailed Ms. Yetta Barre, PA-C about a prompt follow up with the patient. Her land line, listed as [phone] is not a valid number.  Final Clinical Impressions(s) / ED Diagnoses   Final diagnoses:  Transient cerebral ischemia, unspecified type  Dizziness    New Prescriptions New Prescriptions   No medications on file     Derwood Kaplan, MD 09/01/16 [phone]

## 2016-09-01 NOTE — ED Triage Notes (Signed)
Pt from home with multiple complaints. Pt states she woke up at 0315 this am feeling dizzy and "stiff" all over. Pt also c/o chest tightness and arm pain, hx of angina, states pain has been more frequent over the last few weeks. Also c/o "draining" around tooth.

## 2016-09-01 NOTE — ED Notes (Signed)
Pt is in stable condition upon d/c and ambulates from ED. 

## 2016-09-01 NOTE — Telephone Encounter (Signed)
I tried to contact patient and no answer at home. I have blocked the 10:40am appointment with [facility] for tomorrow 09/01/2016. We will try to contact patient again later today.

## 2016-09-02 ENCOUNTER — Encounter: Payer: Self-pay | Admitting: Physician Assistant

## 2016-09-02 ENCOUNTER — Ambulatory Visit (INDEPENDENT_AMBULATORY_CARE_PROVIDER_SITE_OTHER): Payer: Medicare HMO | Admitting: Physician Assistant

## 2016-09-02 VITALS — BP 102/55 | HR 87 | Temp 96.5°F | Ht 62.0 in | Wt 113.0 lb

## 2016-09-02 DIAGNOSIS — G459 Transient cerebral ischemic attack, unspecified: Secondary | ICD-10-CM

## 2016-09-02 DIAGNOSIS — I7121 Aneurysm of the ascending aorta, without rupture: Secondary | ICD-10-CM

## 2016-09-02 DIAGNOSIS — G451 Carotid artery syndrome (hemispheric): Secondary | ICD-10-CM

## 2016-09-02 DIAGNOSIS — I712 Thoracic aortic aneurysm, without rupture: Secondary | ICD-10-CM | POA: Diagnosis not present

## 2016-09-02 NOTE — Progress Notes (Signed)
BP (!) 102/55   Pulse 87   Temp (!) 96.5 F (35.8 C) (Oral)   Ht  (1.575 m)   Wt 113 lb (51.3 kg)   BMI 20.67 kg/m    Subjective:    Patient ID: Evelyn [name], female    DOB: 10/28/1940, 76 y.o.   MRN: [id number]  HPI: Evelyn [name] is a 76 y.o. female presenting on 09/02/2016 for ER Follow up  This patient comes in for emergency room follow-up. She was seen yesterday for possible TIA. Her MRI was clear. Emergency room physician contacted [place] to have an appointment set up so that she can have neurology evaluation as soon as possible. The patient comes in today with out any complaints at this time. She states overall she is not having any symptoms at all today. Yesterday morning when she got up she was unable to stand in both legs were weak and went out from under her. She had never had that happen before. She has a known cardiovascular patient with an aneurysm present and with previous MI issues. She takes very good care of herself and is concerned about the possibility of having a stroke. She has a brother that also has had a stroke. She had a husband had strokes multiple times.  Relevant past medical, surgical, family and social history reviewed and updated as indicated. Allergies and medications reviewed and updated.  Past Medical History:  Diagnosis Date  . Aneurysm (HCC)   . Aortic insufficiency   . CAD (coronary artery disease)    CABG LIMA to LAD,vein graft to diagonal,vein graft to OM  . Dyslipidemia   . GERD (gastroesophageal reflux disease)   . Hypothyroid     Past Surgical History:  Procedure Laterality Date  . BACK SURGERY  2009  . CORONARY ANGIOPLASTY  2003   cutting balloon atherectomy in-stent stenosis of the LAD.  [name] CORONARY ANGIOPLASTY WITH STENT PLACEMENT  2002   remote PCI & stenting LAD,  . CORONARY ARTERY BYPASS GRAFT  2004   LIMA to LAD,vein graft to diagonal,vein graft to OM  . LEFT HEART CATHETERIZATION WITH CORONARY ANGIOGRAM N/A 12/15/2011     Procedure: LEFT HEART CATHETERIZATION WITH CORONARY ANGIOGRAM;  Surgeon: Thurmon Fair, MD;  Location: [facility]ATH [facility];  Service: Cardiovascular;  Laterality: N/A;  . NM MYOCAR PERF WALL MOTION  12/01/2011   normal study-persistent exercise induced ECG changes raises the concern for ischemia (balanced ischemia).    Review of Systems  Constitutional: Negative.  Negative for activity change, fatigue and fever.  HENT: Negative.   Eyes: Negative.   Respiratory: Negative.  Negative for cough.   Cardiovascular: Negative.  Negative for chest pain.  Gastrointestinal: Negative.  Negative for abdominal pain.  Endocrine: Negative.   Genitourinary: Negative.  Negative for dysuria.  Musculoskeletal: Negative.   Skin: Negative.   Neurological: Negative.     Allergies as of 09/02/2016      Reactions   Sulfa Antibiotics Swelling      Medication List       Accurate as of 09/02/16 11:10 AM. Always use your most recent med list.          ALPRAZolam 0.25 MG tablet Commonly known as:  XANAX Take 1 tablet (0.25 mg total) by mouth 3 (three) times daily.   aspirin EC 81 MG tablet Take 81 mg by mouth daily.   furosemide 20 MG tablet Commonly known as:  LASIX Take 1 tablet (20 mg total) by mouth daily.  HYDROcodone-acetaminophen 5-325 MG tablet Commonly known as:  NORCO/VICODIN Take 1 tablet by mouth every 6 (six) hours as needed for moderate pain.   HYDROcodone-acetaminophen 5-325 MG tablet Commonly known as:  NORCO/VICODIN Take 1 tablet by mouth every 6 (six) hours as needed for moderate pain.   levothyroxine 88 MCG tablet Commonly known as:  SYNTHROID, LEVOTHROID Take 1 tablet (88 mcg total) by mouth daily.   metoprolol 100 MG tablet Commonly known as:  LOPRESSOR Take 1 tablet (100 mg) in  the morning and take 1/2  tablet (50 mg) in the  evening   nitroGLYCERIN 0.4 MG SL tablet Commonly known as:  NITROSTAT Place 1 tablet (0.4 mg total) under the tongue every 5 (five) minutes as  needed for chest pain.   penicillin v potassium 500 MG tablet Commonly known as:  VEETID Take 500 mg by mouth 4 (four) times daily.   RECLAST IV Inject into the vein. Once yearly   rosuvastatin 20 MG tablet Commonly known as:  CRESTOR Take 1 tablet (20 mg total) by mouth daily.          Objective:    BP (!) 102/55   Pulse 87   Temp (!) 96.5 F (35.8 C) (Oral)   Ht  (1.575 m)   Wt 113 lb (51.3 kg)   BMI 20.67 kg/m   Allergies  Allergen Reactions  . Sulfa Antibiotics Swelling    Physical Exam  Constitutional: She is oriented to person, place, and time. She appears well-developed and well-nourished.  HENT:  Head: Normocephalic and atraumatic.  Right Ear: Tympanic membrane, external ear and ear canal normal.  Left Ear: Tympanic membrane, external ear and ear canal normal.  Nose: Nose normal. No rhinorrhea.  Mouth/Throat: Oropharynx is clear and moist and mucous membranes are normal. No oropharyngeal exudate or posterior oropharyngeal erythema.  Eyes: Conjunctivae and EOM are normal. Pupils are equal, round, and reactive to light.  Neck: Normal range of motion. Neck supple.  Cardiovascular: Normal rate, regular rhythm, normal heart sounds and intact distal pulses.   Pulmonary/Chest: Effort normal and breath sounds normal.  Abdominal: Soft. Bowel sounds are normal.  Neurological: She is alert and oriented to person, place, and time. She has normal reflexes.  Skin: Skin is warm and dry. No rash noted.  Psychiatric: She has a normal mood and affect. Her behavior is normal. Judgment and thought content normal.  Nursing note and vitals reviewed.   Results for orders placed or performed during the hospital encounter of 09/01/16  Basic metabolic panel  Result Value Ref Range   Sodium 136 135 - 145 mmol/L   Potassium 4.4 3.5 - 5.1 mmol/L   Chloride 105 101 - 111 mmol/L   CO2 23 22 - 32 mmol/L   Glucose, Bld 99 65 - 99 mg/dL   BUN 10 6 - 20 mg/dL   Creatinine, Ser  1.61 0.44 - 1.00 mg/dL   Calcium 9.3 8.9 - 09.6 mg/dL   GFR calc non Af Amer >60 >60 mL/min   GFR calc Af Amer >60 >60 mL/min   Anion gap 8 5 - 15  CBC  Result Value Ref Range   WBC 9.8 4.0 - 10.5 K/uL   RBC 4.13 3.87 - 5.11 MIL/uL   Hemoglobin 12.5 12.0 - 15.0 g/dL   HCT 04.5 40.9 - 81.1 %   MCV 92.0 78.0 - 100.0 fL   MCH 30.3 26.0 - 34.0 pg   MCHC 32.9 30.0 - 36.0 g/dL  RDW 14.4 11.5 - 15.5 %   Platelets 238 150 - 400 K/uL  Protime-INR  Result Value Ref Range   Prothrombin Time 13.6 11.4 - 15.2 seconds   INR 1.04   APTT  Result Value Ref Range   aPTT 37 (H) 24 - 36 seconds  Urinalysis, Routine w reflex microscopic  Result Value Ref Range   Color, Urine YELLOW YELLOW   APPearance CLEAR CLEAR   Specific Gravity, Urine <1.005 (L) 1.005 - 1.030   pH 5.5 5.0 - 8.0   Glucose, UA NEGATIVE NEGATIVE mg/dL   Hgb urine dipstick SMALL (A) NEGATIVE   Bilirubin Urine NEGATIVE NEGATIVE   Ketones, ur NEGATIVE NEGATIVE mg/dL   Protein, ur NEGATIVE NEGATIVE mg/dL   Nitrite NEGATIVE NEGATIVE   Leukocytes, UA TRACE (A) NEGATIVE  Differential  Result Value Ref Range   Neutrophils Relative % 62 %   Neutro Abs 5.7 1.7 - 7.7 K/uL   Lymphocytes Relative 30 %   Lymphs Abs 2.7 0.7 - 4.0 K/uL   Monocytes Relative 7 %   Monocytes Absolute 0.6 0.1 - 1.0 K/uL   Eosinophils Relative 1 %   Eosinophils Absolute 0.1 0.0 - 0.7 K/uL   Basophils Relative 0 %   Basophils Absolute 0.0 0.0 - 0.1 K/uL  Urinalysis, Microscopic (reflex)  Result Value Ref Range   RBC / HPF 0-5 0 - 5 RBC/hpf   WBC, UA 0-5 0 - 5 WBC/hpf   Bacteria, UA MANY (A) NONE SEEN   Squamous Epithelial / LPF 0-5 (A) NONE SEEN  I-stat troponin, ED  Result Value Ref Range   Troponin i, poc 0.01 0.00 - 0.08 ng/mL   Comment 3              Assessment & Plan:   1. Transient cerebral ischemia, unspecified type - Ambulatory referral to Neurology Change ASA 81 to 325 mg for now.  2. Hemispheric carotid artery syndrome -  Ambulatory referral to Neurology  3. Ascending aortic aneurysm [facility]) - Ambulatory referral to Neurology   Current Outpatient Prescriptions:  .  ALPRAZolam (XANAX) 0.25 MG tablet, Take 1 tablet (0.25 mg total) by mouth 3 (three) times daily., Disp: 90 tablet, Rfl: 1 .  aspirin EC 81 MG tablet, Take 325 mg by mouth daily., Disp: , Rfl:  .  furosemide (LASIX) 20 MG tablet, Take 1 tablet (20 mg total) by mouth daily., Disp: 90 tablet, Rfl: 3 .  HYDROcodone-acetaminophen (NORCO/VICODIN) 5-325 MG tablet, Take 1 tablet by mouth every 6 (six) hours as needed for moderate pain., Disp: 120 tablet, Rfl: 0 .  HYDROcodone-acetaminophen (NORCO/VICODIN) 5-325 MG tablet, Take 1 tablet by mouth every 6 (six) hours as needed for moderate pain., Disp: 120 tablet, Rfl: 0 .  levothyroxine (SYNTHROID, LEVOTHROID) 88 MCG tablet, Take 1 tablet (88 mcg total) by mouth daily., Disp: 90 tablet, Rfl: 3 .  metoprolol (LOPRESSOR) 100 MG tablet, Take 1 tablet (100 mg) in  the morning and take 1/2  tablet (50 mg) in the  evening, Disp: 135 tablet, Rfl: 9 .  nitroGLYCERIN (NITROSTAT) 0.4 MG SL tablet, Place 1 tablet (0.4 mg total) under the tongue every 5 (five) minutes as needed for chest pain., Disp: 25 tablet, Rfl: 3 .  rosuvastatin (CRESTOR) 20 MG tablet, Take 1 tablet (20 mg total) by mouth daily., Disp: 30 tablet, Rfl: 11 .  Zoledronic Acid (RECLAST IV), Inject into the vein. Once yearly, Disp: , Rfl:  .  penicillin v potassium (VEETID) 500 MG tablet,  Take 500 mg by mouth 4 (four) times daily., Disp: , Rfl:   Continue all other maintenance medications as listed above.  Follow up plan: Return if symptoms worsen or fail to improve.  Educational handout given for TIA  Remus Loffler PA-C [facility] [address]  [place], [place] [address] [phone]   09/02/2016, 11:10 AM

## 2016-09-02 NOTE — Patient Instructions (Signed)
Aphasia Aphasia is damage to the part of your brain that you need to communicate. For most people, that area is on the left side of the brain. Aphasia does not affect your intelligence, but you may struggle to talk, understand speech, read, or write. Aphasia can happen to anyone at any age, but it is most common in older age. What are the causes? An interruption of blood supply to the brain (stroke) is the most common cause of aphasia. Any disease or disorder that damages the communication areas of the brain can cause aphasia. This includes:  Brain tumors.  Brain injuries.  Brain infections.  Progressive diseases of the nervous system (neurological disorders). What increases the risk? You may be at risk for aphasia if you have had any trauma, disease, or disorder that damaged the communication areas of the brain. What are the signs or symptoms? Aphasia may start suddenly if it is caused by a stroke or brain injury. Aphasia caused by a tumor or a progressive neurological disorder may start gradually. The condition affects people differently. Signs and symptoms of aphasia include:  Trouble finding the right word.  Using the wrong words.  Talking in sentences that do not make sense.  Making up words.  Being unable to understand other people's speech.  Having problems writing, spelling, or reading.  Having trouble with numbers.  Having trouble swallowing. How is this diagnosed? Your health care provider may suspect you have aphasia if you lose the ability to speak or understand language. You may need to see a specialist (speech and language pathologist) to help determine the diagnosis of aphasia. This person may do a series of tests to check your ability to:  Speak.  Express ideas.  Make conversation.  Understand speech.  Read and write. How is this treated? In some cases, aphasia may improve on its own over time. Treatment for aphasia usually involves therapy with a  pathologist. Your treatment will be designed to meet your needs and abilities. Common treatments include:  Speech therapy.  Learning other ways to communicate.  Working with family members to find the best ways to communicate.  Working with an occupational therapist to find ways to communicate at work. Follow these instructions at home:  Keep all follow-up appointments.  Make sure you have a good support system at home.  The following techniques may be helpful while communicating:  Use short, simple sentences. Ask family members to do the same. Sentences that require one-word answers are easiest.  Avoid distractions like background noise when trying to listen or talk.  Try communicating with gestures, pointing, or drawing.  Talk slowly. Ask family members to talk to you slowly.  Maintain eye contact when communicating. Contact a health care provider if:  Your symptoms change or get worse.  You need more support at home.  You are struggling with anxiety or depression.  You develop trouble swallowing. This information is not intended to replace advice given to you by your health care provider. Make sure you discuss any questions you have with your health care provider. Document Released: 01/23/2002 Document Revised: 11/21/2015 Document Reviewed: 07/23/2013 Elsevier Interactive Patient Education  2017 [employer].

## 2016-09-17 ENCOUNTER — Ambulatory Visit (INDEPENDENT_AMBULATORY_CARE_PROVIDER_SITE_OTHER): Payer: Medicare HMO | Admitting: Neurology

## 2016-09-17 ENCOUNTER — Encounter: Payer: Self-pay | Admitting: Neurology

## 2016-09-17 VITALS — Ht 62.0 in | Wt 113.6 lb

## 2016-09-17 DIAGNOSIS — G459 Transient cerebral ischemic attack, unspecified: Secondary | ICD-10-CM | POA: Diagnosis not present

## 2016-09-17 DIAGNOSIS — E782 Mixed hyperlipidemia: Secondary | ICD-10-CM

## 2016-09-17 MED ORDER — ROSUVASTATIN CALCIUM 20 MG PO TABS: ORAL_TABLET | ORAL | 11 refills | Status: DC

## 2016-09-17 MED ORDER — ROSUVASTATIN CALCIUM 10 MG PO TABS: ORAL_TABLET | ORAL | 11 refills | Status: DC

## 2016-09-17 NOTE — Progress Notes (Signed)
Thank you, glad to see her! Evelyn [name]

## 2016-09-17 NOTE — Patient Instructions (Signed)
Remember to drink plenty of fluid, eat healthy meals and do not skip any meals. Try to eat protein with a every meal and eat a healthy snack such as fruit or nuts in between meals. Try to keep a regular sleep-wake schedule and try to exercise daily, particularly in the form of walking, 20-30 minutes a day, if you can.   As far as your medications are concerned, I would like to suggest; Aspirin 325mg  daily, Crestor: Take 20mg  pill and 10mg  pill in the evenings together for a total of 30mg  daily  As far as diagnostic testing: CT of the head of the neck to look at arteries, a lab test today and please see Dr. Phillips Odor for echocardiogram  I would like to see you back as needed, sooner if we need to. Please call us with any interim questions, concerns, problems, updates or refill requests.   Our phone number is [phone]. We also have an after hours call service for urgent matters and there is a physician on-call for urgent questions. For any emergencies you know to call 911 or go to the nearest emergency room   Transient Ischemic Attack A transient ischemic attack (TIA) is a "warning stroke" that causes stroke-like symptoms. Unlike a stroke, a TIA does not cause permanent damage to the brain. The symptoms of a TIA can happen very fast and do not last long. It is important to know the symptoms of a TIA and what to do. This can help prevent a major stroke or death. What are the causes? A TIA is caused by a temporary blockage in an artery in the brain or neck (carotid artery). The blockage does not allow the brain to get the blood supply it needs and can cause different symptoms. The blockage can be caused by either:  A blood clot.  Fatty buildup (plaque) in a neck or brain artery. What increases the risk?  High blood pressure (hypertension).  High cholesterol.  Diabetes mellitus.  Heart disease.  The buildup of plaque in the blood vessels (peripheral artery disease or  atherosclerosis).  The buildup of plaque in the blood vessels that provide blood and oxygen to the brain (carotid artery stenosis).  An abnormal heart rhythm (atrial fibrillation).  Obesity.  Using any tobacco products, including cigarettes, chewing tobacco, or electronic cigarettes.  Taking oral contraceptives, especially in combination with using tobacco.  Physical inactivity.  A diet high in fats, salt (sodium), and calories.  Excessive alcohol use.  Use of illegal drugs (especially cocaine and methamphetamine).  Being female.  Being African American.  Being over the age of 45 years.  Family history of stroke.  Previous history of blood clots, stroke, TIA, or heart attack.  Sickle cell disease. What are the signs or symptoms? TIA symptoms are the same as a stroke but are temporary. These symptoms usually develop suddenly, or may be newly present upon waking from sleep:  Sudden weakness or numbness of the face, arm, or leg, especially on one side of the body.  Sudden trouble walking or difficulty moving arms or legs.  Sudden confusion.  Sudden personality changes.  Trouble speaking (aphasia) or understanding.  Difficulty swallowing.  Sudden trouble seeing in one or both eyes.  Double vision.  Dizziness.  Loss of balance or coordination.  Sudden severe headache with no known cause.  Trouble reading or writing.  Loss of bowel or bladder control.  Loss of consciousness. How is this diagnosed? Your health care provider may be able to determine  the presence or absence of a TIA based on your symptoms, history, and physical exam. CT scan of the brain is usually performed to help identify a TIA. Other tests may include:  Electrocardiography (ECG).  Continuous heart monitoring.  Echocardiography.  Carotid ultrasonography.  MRI.  A scan of the brain circulation.  Blood tests. How is this treated? Since the symptoms of TIA are the same as a stroke,  it is important to seek treatment as soon as possible. You may need a medicine to dissolve a blood clot (thrombolytic) if that is the cause of the TIA. This medicine cannot be given if too much time has passed. Treatment may also include:  Rest, oxygen, fluids through an IV tube, and medicines to thin the blood (anticoagulants).  Measures will be taken to prevent short-term and long-term complications, including infection from breathing foreign material into the lungs (aspiration pneumonia), blood clots in the legs, and falls.  Procedures to either remove plaque in the carotid arteries or dilate carotid arteries that have narrowed due to plaque. Those procedures are:  Carotid endarterectomy.  Carotid angioplasty and stenting.  Medicines and diet may be used to address diabetes, high blood pressure, and other underlying risk factors. Follow these instructions at home:  Take medicines only as directed by your health care provider. Follow the directions carefully. Medicines may be used to control risk factors for a stroke. Be sure you understand all your medicine instructions.  You may be told to take aspirin or the anticoagulant warfarin. Warfarin needs to be taken exactly as instructed.  Taking too much or too little warfarin is dangerous. Too much warfarin increases the risk of bleeding. Too little warfarin continues to allow the risk for blood clots. While taking warfarin, you will need to have regular blood tests to measure your blood clotting time. A PT blood test measures how long it takes for blood to clot. Your PT is used to calculate another value called an INR. Your PT and INR help your health care provider to adjust your dose of warfarin. The dose can change for many reasons. It is critically important that you take warfarin exactly as prescribed.  Many foods, especially foods high in vitamin K can interfere with warfarin and affect the PT and INR. Foods high in vitamin K include  spinach, kale, broccoli, cabbage, collard and turnip greens, Brussels sprouts, peas, cauliflower, seaweed, and parsley, as well as beef and pork liver, green tea, and soybean oil. You should eat a consistent amount of foods high in vitamin K. Avoid major changes in your diet, or notify your health care provider before changing your diet. Arrange a visit with a dietitian to answer your questions.  Many medicines can interfere with warfarin and affect the PT and INR. You must tell your health care provider about any and all medicines you take; this includes all vitamins and supplements. Be especially cautious with aspirin and anti-inflammatory medicines. Do not take or discontinue any prescribed or over-the-counter medicine except on the advice of your health care provider or pharmacist.  Warfarin can have side effects, such as excessive bruising or bleeding. You will need to hold pressure over cuts for longer than usual. Your health care provider or pharmacist will discuss other potential side effects.  Avoid sports or activities that may cause injury or bleeding.  Be careful when shaving, flossing your teeth, or handling sharp objects.  Alcohol can change the body's ability to handle warfarin. It is best to avoid alcoholic drinks  or consume only very small amounts while taking warfarin. Notify your health care provider if you change your alcohol intake.  Notify your dentist or other health care providers before procedures.  Eat a diet that includes 5 or more servings of fruits and vegetables each day. This may reduce the risk of stroke. Certain diets may be prescribed to address high blood pressure, high cholesterol, diabetes, or obesity.  A diet low in sodium, saturated fat, trans fat, and cholesterol is recommended to manage high blood pressure.  A diet low in saturated fat, trans fat, and cholesterol, and high in fiber may control cholesterol levels.  A controlled-carbohydrate,  controlled-sugar diet is recommended to manage diabetes.  A reduced-calorie diet that is low in sodium, saturated fat, trans fat, and cholesterol is recommended to manage obesity.  Maintain a healthy weight.  Stay physically active. It is recommended that you get at least 30 minutes of activity on most or all days.  Do not use any tobacco products, including cigarettes, chewing tobacco, or electronic cigarettes. If you need help quitting, ask your health care provider.  Limit alcohol intake to no more than 1 drink per day for nonpregnant women and 2 drinks per day for men. One drink equals 12 ounces of beer, 5 ounces of wine, or 1 ounces of hard liquor.  Do not abuse drugs.  A safe home environment is important to reduce the risk of falls. Your health care provider may arrange for specialists to evaluate your home. Having grab bars in the bedroom and bathroom is often important. Your health care provider may arrange for equipment to be used at home, such as raised toilets and a seat for the shower.  Follow all instructions for follow-up with your health care provider. This is very important. This includes any referrals and lab tests. Proper follow-up can prevent a stroke or another TIA from occurring. How is this prevented? The risk of a TIA can be decreased by appropriately treating high blood pressure, high cholesterol, diabetes, heart disease, and obesity, and by quitting smoking, limiting alcohol, and staying physically active. Contact a health care provider if:  You have personality changes.  You have difficulty swallowing.  You are seeing double.  You have dizziness.  You have a fever. Get help right away if: Any of the following symptoms may represent a serious problem that is an emergency. Do not wait to see if the symptoms will go away. Get medical help right away. Call your local emergency services (911 in [place] Do not drive yourself to the hospital.  You have sudden  weakness or numbness of the face, arm, or leg, especially on one side of the body.  You have sudden trouble walking or difficulty moving arms or legs.  You have sudden confusion.  You have trouble speaking (aphasia) or understanding.  You have sudden trouble seeing in one or both eyes.  You have a loss of balance or coordination.  You have a sudden, severe headache with no known cause.  You have new chest pain or an irregular heartbeat.  You have a partial or total loss of consciousness. This information is not intended to replace advice given to you by your health care provider. Make sure you discuss any questions you have with your health care provider. Document Released: 02/10/2005 Document Revised: 01/05/2016 Document Reviewed: 08/08/2013 Elsevier Interactive Patient Education  2017 [employer].

## 2016-09-17 NOTE — Progress Notes (Signed)
[facility]    Provider:  Dr Lucia [name] Referring Provider: Remus Loffler, PA-C Primary Care Physician:  Evelyn Loffler, PA-C  CC:  TIA  HPI:  Evelyn [name] is a 76 y.o. female here as a referral from Evelyn. Evelyn [name] for TIA. 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 Prior review of the charts, patient was seen in the emergency room in 09/01/2016. She woke up in the morning and she noted she was significantly dizzy. Dizziness described as lightheadedness that was constant. She felt unsteady getting up in the symptoms continue, she was laying flat. She improved in route to the ER but symptoms are present for at least an hour with nausea. No sensory changes. Per patient, shehas been dizzy in the past but never this severe. She kept getting up and falling back abd then it waxed and waned for several days and resolved. No facial droop, no dysarthria or aphasia, no focal weakness. She has had several episodes over the last few years or brief episodes of difficulty peaking, her tongue "wouldn't say anything" for a few minutes. Her husband died of a stroke a year ago. She had 2 episodes of speech difficulty. She was on 81mg  and since the event now on 325mg .   Reviewed notes, labs and imaging from outside physicians, which showed:   Personally reviewed MRI of the brain images and agree with the following:  No restricted diffusion to suggest acute infarction. No midline shift, mass effect, evidence of mass lesion, ventriculomegaly, extra-axial collection or acute intracranial hemorrhage. Cervicomedullary junction and pituitary are within normal limits.  Evelyn [name] and white matter signal is within normal limits for age throughout the brain. No cortical encephalomalacia. No chronic cerebral blood products. Deep gray matter nuclei, brainstem and cerebellum appear normal.  Vascular: Major intracranial vascular flow voids  are preserved. Mild intracranial artery tortuosity.  Skull and upper cervical spine: Partially visible cervical ACDF hardware artifact. Chronic C3-C4 disc and endplate degeneration.  Normal bone marrow signal.  Sinuses/Orbits: Normal orbits soft tissues. Visualized paranasal sinuses and mastoids are well pneumatized.  Other: Visible internal auditory structures appear normal. Negative scalp soft tissues.  IMPRESSION: 1. No acute intracranial abnormality. Normal for age noncontrast MRI appearance of the brain. 2. Chronic cervical spine degeneration. Partially visible cervical ACDF.  Review of Systems: Patient complains of symptoms per HPI as well as the following symptoms: dizziness, no CP, no SOB. Pertinent negatives per HPI. All others negative.   Social History   Social History  . Marital status: Married    Spouse name: N/A  . Number of children: N/A  . Years of education: N/A   Occupational History  . Not on file.   Social History Main Topics  . Smoking status: Never Smoker  . Smokeless tobacco: Never Used  . Alcohol use No  . Drug use: No  . Sexual activity: Not on file   Other Topics Concern  . Not on file   Social History Narrative   Mother of 2 with 4 grandchildren    Family History  Problem Relation Age of Onset  . Heart attack Mother   . Heart failure Father   . Hypertension Father   . Asthma Maternal Grandmother     Past Medical History:  Diagnosis Date  . Aneurysm (HCC)   . Aortic insufficiency   . CAD (coronary artery disease)    CABG LIMA to LAD,vein graft to diagonal,vein graft to OM  .  Dyslipidemia   . GERD (gastroesophageal reflux disease)   . Hypothyroid     Past Surgical History:  Procedure Laterality Date  . BACK SURGERY  2009  . CORONARY ANGIOPLASTY  2003   cutting balloon atherectomy in-stent stenosis of the LAD.  [name]. CORONARY ANGIOPLASTY WITH STENT PLACEMENT  2002   remote PCI & stenting LAD,  . CORONARY ARTERY BYPASS  GRAFT  2004   LIMA to LAD,vein graft to diagonal,vein graft to OM  . LEFT HEART CATHETERIZATION WITH CORONARY ANGIOGRAM N/A 12/15/2011   Procedure: LEFT HEART CATHETERIZATION WITH CORONARY ANGIOGRAM;  Surgeon: Evelyn FairMihai Croitoru, MD;  Location: [facility]ATH [facility];  Service: Cardiovascular;  Laterality: N/A;  . NM MYOCAR PERF WALL MOTION  12/01/2011   normal study-persistent exercise induced ECG changes raises the concern for ischemia (balanced ischemia).    Current Outpatient Prescriptions  Medication Sig Dispense Refill  . ALPRAZolam (XANAX) 0.25 MG tablet Take 1 tablet (0.25 mg total) by mouth 3 (three) times daily. 90 tablet 1  . aspirin EC 81 MG tablet Take 325 mg by mouth daily.    . furosemide (LASIX) 20 MG tablet Take 1 tablet (20 mg total) by mouth daily. 90 tablet 3  . HYDROcodone-acetaminophen (NORCO/VICODIN) 5-325 MG tablet Take 1 tablet by mouth every 6 (six) hours as needed for moderate pain. 120 tablet 0  . HYDROcodone-acetaminophen (NORCO/VICODIN) 5-325 MG tablet Take 1 tablet by mouth every 6 (six) hours as needed for moderate pain. 120 tablet 0  . levothyroxine (SYNTHROID, LEVOTHROID) 88 MCG tablet Take 1 tablet (88 mcg total) by mouth daily. 90 tablet 3  . metoprolol (LOPRESSOR) 100 MG tablet Take 1 tablet (100 mg) in  the morning and take 1/2  tablet (50 mg) in the  evening 135 tablet 9  . nitroGLYCERIN (NITROSTAT) 0.4 MG SL tablet Place 1 tablet (0.4 mg total) under the tongue every 5 (five) minutes as needed for chest pain. 25 tablet 3  . penicillin v potassium (VEETID) 500 MG tablet Take 500 mg by mouth 4 (four) times daily.    . rosuvastatin (CRESTOR) 20 MG tablet Take 1 tablet (20 mg total) by mouth daily. 30 tablet 11  . Zoledronic Acid (RECLAST IV) Inject into the vein. Once yearly     No current facility-administered medications for this visit.     Allergies as of 09/17/2016 - Review Complete 09/02/2016  Allergen Reaction Noted  . Sulfa antibiotics Swelling 12/15/2011     Vitals: There were no vitals taken for this visit. Last Weight:  Wt Readings from Last 1 Encounters:  09/02/16 113 lb (51.3 kg)   Last Height:   Ht Readings from Last 1 Encounters:  09/02/16 5\' 2"  (1.575 m)    Physical exam: Exam: Gen: NAD, conversant                    CV: RRR, no MRG. No Carotid Bruits. No peripheral edema, warm, nontender Eyes: Conjunctivae clear without exudates or hemorrhage  Neuro: Detailed Neurologic Exam  Speech:    Speech is normal; fluent and spontaneous with normal comprehension.  Cognition:    The patient is oriented to person, place, and time;     recent and remote memory intact;     language fluent;     normal attention, concentration,     fund of knowledge Cranial Nerves:    The pupils are equal, round, and reactive to light. Attempted funduscopic exam could not visualize due to small pupils. Visual fields are  full to finger confrontation. Extraocular movements are intact. Trigeminal sensation is intact and the muscles of mastication are normal. The face is symmetric. The palate elevates in the midline. Hearing intact. Voice is normal. Shoulder shrug is normal. The tongue has normal motion without fasciculations.   Coordination:    No dysmetria   Gait:    Normal native gait, and not ataxic, good stride and arm swing  Motor Observation:    No asymmetry, no atrophy, and no involuntary movements noted. Tone:    Normal muscle tone.    Posture:    Posture is normal. normal erect    Strength: Minimal proximal weakness, otherwise strength is V/V in the upper and lower limbs.      Sensation: intact to LT     Reflex Exam:  DTR's:    Deep tendon reflexes in the upper and lower extremities are brisk bilaterally.   Toes:    The toes are downgoing bilaterally.   Clonus:    Clonus is absent.      Assessment/Plan:  76 year old female with acute onset dizziness that resolved in 1 hour. MRI of the brain showed no acute findings. She  was referred here for evaluation of TIA. Dizziness is a very nonspecific symptom, unclear etiology of patient's episode but will workup for TIA. Also recommend aspirin 325 mg daily as opposed to 81 mg.  - CTA of the head and neck - ldl 86, recommend increasing statin for goal < 70. Will increase Crestor to 30 mg in the evenings. - check hgba1c - echocardiogram with bubble study (Evelyn. Royann Shivers is her cardiologist and I will send note to him, and ask her to see him about further testing and echocardiogram) - ASA 325mg  daily  Cc:  Evelyn Loffler, PA-C, Evelyn. Royann Shivers  I had a long d/w about risk for stroke/TIAs, personally independently reviewed imaging studies and lab evaluation results and answered questions. ASA 325mg  for secondary stroke prevention and maintain strict control of hypertension with blood pressure goal below 130/90, diabetes with hemoglobin A1c goal below 6.5% and lipids with LDL cholesterol goal below 70 mg/dL. I also advised the patient to eat a healthy diet with plenty of whole grains, cereals, fruits and vegetables, exercise regularly and maintain ideal body weight .  Followup in the future with Megan in 6 months or call earlier if necessary.  Orders Placed This Encounter  Procedures  . CT ANGIO HEAD W OR WO CONTRAST  . CT ANGIO NECK W OR WO CONTRAST  . Hemoglobin A1c    Cc:  Evelyn Loffler, PA-C  Naomie Dean, MD  [facility] [address] [place], [place] [address]  Phone [phone] Fax [phone]

## 2016-09-18 LAB — HEMOGLOBIN A1C
Est. average glucose Bld gHb Est-mCnc: 120 mg/dL
Hgb A1c MFr Bld: 5.8 % — ABNORMAL HIGH (ref 4.8–5.6)

## 2016-09-20 ENCOUNTER — Other Ambulatory Visit: Payer: Medicare HMO

## 2016-09-20 DIAGNOSIS — E039 Hypothyroidism, unspecified: Secondary | ICD-10-CM | POA: Diagnosis not present

## 2016-09-21 ENCOUNTER — Other Ambulatory Visit: Payer: Self-pay | Admitting: Physician Assistant

## 2016-09-21 ENCOUNTER — Telehealth: Payer: Self-pay | Admitting: *Deleted

## 2016-09-21 ENCOUNTER — Telehealth: Payer: Self-pay

## 2016-09-21 LAB — TSH: TSH: 0.174 u[IU]/mL — ABNORMAL LOW (ref 0.450–4.500)

## 2016-09-21 LAB — T4, FREE: Free T4: 1.58 ng/dL (ref 0.82–1.77)

## 2016-09-21 MED ORDER — LEVOTHYROXINE SODIUM 75 MCG PO TABS: 75.0000 ug | ORAL_TABLET | Freq: Every day | ORAL | 2 refills | Status: DC

## 2016-09-21 NOTE — Telephone Encounter (Signed)
-----   Message from [name], MD sent at 09/18/2016  9:58 AM EDT ----- Patient is pre-diabetic at hgba1c 5.8. Watch diet, exercise. thanks 

## 2016-09-21 NOTE — Telephone Encounter (Signed)
-----   Message from Anson FretAntonia B Ahern, MD sent at 09/18/2016  9:58 AM EDT ----- Patient is pre-diabetic at hgba1c 5.8. Watch diet, exercise. thanks

## 2016-09-21 NOTE — Telephone Encounter (Signed)
error 

## 2016-09-21 NOTE — Telephone Encounter (Signed)
Attempted to call pt w/ results but says "call cannot be completed at this time, please hang up and try your call again later."

## 2016-09-21 NOTE — Telephone Encounter (Signed)
Called pt w/ lab results and instructions. Verbalized understanding and appreciation for call.

## 2016-09-22 ENCOUNTER — Other Ambulatory Visit: Payer: Self-pay

## 2016-09-22 ENCOUNTER — Telehealth: Payer: Self-pay

## 2016-09-22 DIAGNOSIS — G459 Transient cerebral ischemic attack, unspecified: Secondary | ICD-10-CM

## 2016-09-22 DIAGNOSIS — E782 Mixed hyperlipidemia: Secondary | ICD-10-CM

## 2016-09-22 MED ORDER — ROSUVASTATIN CALCIUM 20 MG PO TABS: 30.0000 mg | ORAL_TABLET | Freq: Every day | ORAL | 11 refills | Status: DC

## 2016-09-22 NOTE — Telephone Encounter (Addendum)
Received fax from pharmacy. New rx for rosuvastatin 20 + 10 mg daily rejected d/t plan limitations exceeded.

## 2016-09-22 NOTE — Telephone Encounter (Signed)
-----   Message from Thurmon FairMihai Croitoru, MD sent at 09/17/2016  5:11 PM EDT ----- Can we please set up for echo WITH saline contrast (bubble study) first available and then first available follow up with me, please. MCr ----- Message ----- From: Anson FretAntonia B Ahern, MD Sent: 09/17/2016   1:19 PM To: Thurmon FairMihai Croitoru, MD  Dr. Royann Shiversroitoru: I am asking patient to see you regarding echocardiogram. She is a patient of yours, sent to me for eval of TIA. She should have echo as part of her workup and she prefers to see you again. Thanks!

## 2016-09-23 ENCOUNTER — Telehealth (HOSPITAL_COMMUNITY): Payer: Self-pay | Admitting: Cardiovascular Disease

## 2016-09-24 NOTE — Telephone Encounter (Signed)
Date/Time Type Contact Phone/Fax         09/23/2016 10:19 AM Phone (Outgoing) Rito EhrlichMartin, Evelyn [name] (Self) [phone] ([name])   By Trina AoGriffin, Ryheem Jay A    09/24/2016 09:58 AM Phone (Outgoing) Rito EhrlichMartin, Evelyn [name] (Self) [phone] ([name]) Remove  Completed - Called pt and spoke with her to get her scheduled for a Bubble echo. She voiced understanding .    By Trina AoGriffin, Jashawn Floyd A           Close PreviousNext

## 2016-09-27 MED ORDER — ROSUVASTATIN CALCIUM 40 MG PO TABS: 40.0000 mg | ORAL_TABLET | Freq: Every day | ORAL | 11 refills | Status: DC

## 2016-09-27 NOTE — Telephone Encounter (Signed)
Also rejected 1.5 tabs of 30 mg. Discussed w/ Dr. Lucia GaskinsAhern and rx changed to 40 mg daily in order to be covered by pt's insurance.

## 2016-09-27 NOTE — Addendum Note (Signed)
Addended by: Donnelly AngelicaHOGAN, JENNIFER L on: 09/27/2016 05:27 PM   Modules accepted: Orders

## 2016-10-06 ENCOUNTER — Ambulatory Visit
Admission: RE | Admit: 2016-10-06 | Discharge: 2016-10-06 | Disposition: A | Discharge status: Home / Self Care | Payer: Medicare HMO | Source: Ambulatory Visit | Attending: Neurology | Admitting: Neurology

## 2016-10-06 DIAGNOSIS — G459 Transient cerebral ischemic attack, unspecified: Secondary | ICD-10-CM

## 2016-10-06 DIAGNOSIS — R42 Dizziness and giddiness: Secondary | ICD-10-CM | POA: Diagnosis not present

## 2016-10-06 MED ORDER — IOPAMIDOL (ISOVUE-370) INJECTION 76%
100.0000 mL | Freq: Once | INTRAVENOUS | Status: AC | PRN
  Administered 2016-10-06: 100 mL via INTRAVENOUS

## 2016-10-08 NOTE — Telephone Encounter (Signed)
Scheduled for 10/12/16.

## 2016-10-12 ENCOUNTER — Other Ambulatory Visit: Payer: Self-pay

## 2016-10-12 ENCOUNTER — Ambulatory Visit (HOSPITAL_COMMUNITY): Payer: Medicare HMO | Attending: Cardiovascular Disease

## 2016-10-12 DIAGNOSIS — E785 Hyperlipidemia, unspecified: Secondary | ICD-10-CM | POA: Insufficient documentation

## 2016-10-12 DIAGNOSIS — I083 Combined rheumatic disorders of mitral, aortic and tricuspid valves: Secondary | ICD-10-CM | POA: Diagnosis not present

## 2016-10-12 DIAGNOSIS — I2510 Atherosclerotic heart disease of native coronary artery without angina pectoris: Secondary | ICD-10-CM | POA: Insufficient documentation

## 2016-10-12 DIAGNOSIS — E039 Hypothyroidism, unspecified: Secondary | ICD-10-CM | POA: Insufficient documentation

## 2016-10-12 DIAGNOSIS — G459 Transient cerebral ischemic attack, unspecified: Secondary | ICD-10-CM | POA: Diagnosis not present

## 2016-10-12 LAB — ECHOCARDIOGRAM LIMITED BUBBLE STUDY
AOASC: 37 cm
CHL CUP TV REG PEAK VELOCITY: 247 cm/s
E decel time: 271 msec
EERAT: 6.23
FS: 35 % (ref 28–44)
IV/PV OW: 0.9
LA ID, A-P, ES: 34 mm
LADIAMINDEX: 2.27 cm/m2
LEFT ATRIUM END SYS DIAM: 34 mm
LV E/e' medial: 6.23
LV E/e'average: 6.23
LV PW d: 10.6 mm — AB (ref 0.6–1.1)
LVELAT: 8.84 cm/s
MV Dec: 271
MV pk A vel: 78.3 m/s
MVPKEVEL: 55.1 m/s
TDI e' lateral: 8.84
TDI e' medial: 5.45
TRMAXVEL: 247 cm/s

## 2016-10-14 ENCOUNTER — Telehealth: Payer: Self-pay | Admitting: *Deleted

## 2016-10-14 NOTE — Telephone Encounter (Signed)
Spoke with patient and informed her that her CT angiogram of head and neck was fine. Advised it did not show any narrowing or acute, worrisome findings. She verbalized understanding, appreciation, had no questions.

## 2016-10-20 ENCOUNTER — Telehealth: Payer: Self-pay | Admitting: Cardiovascular Disease

## 2016-10-20 NOTE — Telephone Encounter (Signed)
Pt would like her Echo results from last Thursday please. Pt says she will not be home from 11 to 1 today.

## 2016-10-20 NOTE — Telephone Encounter (Signed)
Notes recorded by Thurmon Fairroitoru, Mihai, MD on 10/12/2016 at 10:24 AM EDT No evidence for a cardiac source of TIA was identified on echo.    Patient notified of results.

## 2016-11-17 ENCOUNTER — Other Ambulatory Visit: Payer: Self-pay | Admitting: Physician Assistant

## 2016-11-18 ENCOUNTER — Other Ambulatory Visit: Payer: Self-pay | Admitting: "Endocrinology

## 2016-11-18 ENCOUNTER — Other Ambulatory Visit: Payer: Medicare HMO

## 2016-11-18 DIAGNOSIS — E039 Hypothyroidism, unspecified: Secondary | ICD-10-CM

## 2016-11-19 LAB — TSH: TSH: 6.58 u[IU]/mL — ABNORMAL HIGH (ref 0.450–4.500)

## 2016-11-19 LAB — T4, FREE: FREE T4: 1.11 ng/dL (ref 0.82–1.77)

## 2016-11-23 ENCOUNTER — Encounter: Payer: Self-pay | Admitting: Physician Assistant

## 2016-11-23 ENCOUNTER — Ambulatory Visit (INDEPENDENT_AMBULATORY_CARE_PROVIDER_SITE_OTHER): Payer: Medicare HMO | Admitting: Physician Assistant

## 2016-11-23 DIAGNOSIS — S22080D Wedge compression fracture of T11-T12 vertebra, subsequent encounter for fracture with routine healing: Secondary | ICD-10-CM | POA: Diagnosis not present

## 2016-11-23 MED ORDER — HYDROCODONE-ACETAMINOPHEN 5-325 MG PO TABS: 1.0000 | ORAL_TABLET | Freq: Four times a day (QID) | ORAL | 0 refills | Status: DC | PRN

## 2016-11-23 NOTE — Patient Instructions (Signed)
In a few days you may receive a survey in the mail or online from [employer] regarding your visit with us today. Please take a moment to fill this out. Your feedback is very important to our whole office. It can help us better understand your needs as well as improve your experience and satisfaction. Thank you for taking your time to complete it. We care about you.  Raziyah Vanvleck, PA-C  

## 2016-11-23 NOTE — Progress Notes (Signed)
BP 106/63   Pulse (!) 49   Temp 97.8 F (36.6 C) (Oral)   Ht 5\' 2"  (1.575 m)   Wt 114 lb 12.8 oz (52.1 kg)   BMI 21.00 kg/m    Subjective:    Patient ID: Evelyn [name], female    DOB: [birth year], 76 y.o.   MRN: [id number]  HPI: Evelyn LacyShirley H Honaker is a 76 y.o. female presenting on 11/23/2016 for Medication Refill  This patient comes in for periodic recheck on medications and conditions including Chronic pain secondary to compression fracture and degenerative disc disease. She is doing very well and only takes at most 1 or 2 hydrocodone per day. She is a known cardiovascular patient cannot take any NSAID. She is still very active in trying to garden a lot, she tries to walk daily. [place]   All medications are reviewed today. There are no reports of any problems with the medications. All of the medical conditions are reviewed and updated.  Lab work is reviewed and will be ordered as medically necessary. There are no new problems reported with today's visit.   Relevant past medical, surgical, family and social history reviewed and updated as indicated. Allergies and medications reviewed and updated.  Past Medical History:  Diagnosis Date  . Aneurysm (HCC)   . Aortic insufficiency   . CAD (coronary artery disease)    CABG LIMA to LAD,vein graft to diagonal,vein graft to OM  . Dyslipidemia   . GERD (gastroesophageal reflux disease)   . Hypothyroid     Past Surgical History:  Procedure Laterality Date  . ABDOMINAL HYSTERECTOMY    . BACK SURGERY  2009  . CORONARY ANGIOPLASTY  2003   cutting balloon atherectomy in-stent stenosis of the LAD.  [name]. CORONARY ANGIOPLASTY WITH STENT PLACEMENT  2002   remote PCI & stenting LAD,  . CORONARY ARTERY BYPASS GRAFT  2004   LIMA to LAD,vein graft to diagonal,vein graft to OM  . FOOT SURGERY    . LEFT HEART CATHETERIZATION WITH CORONARY ANGIOGRAM N/A 12/15/2011   Procedure: LEFT HEART CATHETERIZATION  WITH CORONARY ANGIOGRAM;  Surgeon: Thurmon FairMihai Croitoru, MD;  Location: [facility];  Service: Cardiovascular;  Laterality: N/A;  . NECK SURGERY    . NM MYOCAR PERF WALL MOTION  12/01/2011   normal study-persistent exercise induced ECG changes raises the concern for ischemia (balanced ischemia).  . NOSE SURGERY    . TONSILLECTOMY  1948  . TUBAL LIGATION      Review of Systems  Constitutional: Negative.  Negative for activity change, fatigue and fever.  HENT: Negative.   Eyes: Negative.   Respiratory: Negative.  Negative for cough.   Cardiovascular: Negative.  Negative for chest pain.  Gastrointestinal: Negative.  Negative for abdominal pain.  Endocrine: Negative.   Genitourinary: Negative.  Negative for dysuria.  Musculoskeletal: Negative.   Skin: Negative.   Neurological: Negative.     Allergies as of 11/23/2016      Reactions   Sulfa Antibiotics Swelling      Medication List       Accurate as of 11/23/16  3:28 PM. Always use your most recent med list.          ALPRAZolam 0.25 MG tablet Commonly known as:  XANAX Take 1 tablet (0.25 mg total) by mouth 3 (three) times daily.   aspirin EC 325 MG tablet Take 325 mg by mouth daily.   furosemide 20 MG tablet  Commonly known as:  LASIX Take 1 tablet (20 mg total) by mouth daily.   HYDROcodone-acetaminophen 5-325 MG tablet Commonly known as:  NORCO/VICODIN Take 1 tablet by mouth every 6 (six) hours as needed for moderate pain.   HYDROcodone-acetaminophen 5-325 MG tablet Commonly known as:  NORCO/VICODIN Take 1 tablet by mouth every 6 (six) hours as needed for moderate pain.   HYDROcodone-acetaminophen 5-325 MG tablet Commonly known as:  NORCO/VICODIN Take 1 tablet by mouth every 6 (six) hours as needed for moderate pain.   levothyroxine 75 MCG tablet Commonly known as:  SYNTHROID, LEVOTHROID Take 1 tablet (75 mcg total) by mouth daily before breakfast.   metoprolol tartrate 100 MG tablet Commonly known as:   LOPRESSOR TAKE 1 TABLET TWICE A DAY   nitroGLYCERIN 0.4 MG SL tablet Commonly known as:  NITROSTAT Place 1 tablet (0.4 mg total) under the tongue every 5 (five) minutes as needed for chest pain.   penicillin v potassium 500 MG tablet Commonly known as:  VEETID Take 500 mg by mouth 4 (four) times daily.   RECLAST IV Inject into the vein. Once yearly   rosuvastatin 40 MG tablet Commonly known as:  CRESTOR Take 1 tablet (40 mg total) by mouth daily.          Objective:    BP 106/63   Pulse (!) 49   Temp 97.8 F (36.6 C) (Oral)   Ht 5\' 2"  (1.575 m)   Wt 114 lb 12.8 oz (52.1 kg)   BMI 21.00 kg/m   Allergies  Allergen Reactions  . Sulfa Antibiotics Swelling    Physical Exam  Constitutional: She is oriented to person, place, and time. She appears well-developed and well-nourished.  HENT:  Head: Normocephalic and atraumatic.  Right Ear: Tympanic membrane, external ear and ear canal normal.  Left Ear: Tympanic membrane, external ear and ear canal normal.  Nose: Nose normal. No rhinorrhea.  Mouth/Throat: Oropharynx is clear and moist and mucous membranes are normal. No oropharyngeal exudate or posterior oropharyngeal erythema.  Eyes: Conjunctivae and EOM are normal. Pupils are equal, round, and reactive to light.  Neck: Normal range of motion. Neck supple.  Cardiovascular: Normal rate, regular rhythm, normal heart sounds and intact distal pulses.   Pulmonary/Chest: Effort normal and breath sounds normal.  Abdominal: Soft. Bowel sounds are normal.  Neurological: She is alert and oriented to person, place, and time. She has normal reflexes.  Skin: Skin is warm and dry. No rash noted.  Psychiatric: She has a normal mood and affect. Her behavior is normal. Judgment and thought content normal.    Results for orders placed or performed in visit on 11/18/16  T4, Free  Result Value Ref Range   Free T4 1.11 0.82 - 1.77 ng/dL  TSH  Result Value Ref Range   TSH 6.580 (H) 0.450  - 4.500 uIU/mL      Assessment & Plan:   1. Closed wedge compression fracture of eleventh thoracic vertebra with routine healing, subsequent encounter - HYDROcodone-acetaminophen (NORCO/VICODIN) 5-325 MG tablet; Take 1 tablet by mouth every 6 (six) hours as needed for moderate pain.  Dispense: 120 tablet; Refill: 0 - HYDROcodone-acetaminophen (NORCO/VICODIN) 5-325 MG tablet; Take 1 tablet by mouth every 6 (six) hours as needed for moderate pain.  Dispense: 120 tablet; Refill: 0 - HYDROcodone-acetaminophen (NORCO/VICODIN) 5-325 MG tablet; Take 1 tablet by mouth every 6 (six) hours as needed for moderate pain.  Dispense: 120 tablet; Refill: 0   Continue all other maintenance medications as  listed above.  Follow up plan: Return in about 3 months (around 02/23/2017) for recheck.  Educational handout given for survey   Remus Loffler PA-C [facility] [address]  [place], [place] [address] [phone]   11/23/2016, 3:28 PM

## 2016-11-25 ENCOUNTER — Encounter: Payer: Self-pay | Admitting: "Endocrinology

## 2016-11-25 ENCOUNTER — Ambulatory Visit (INDEPENDENT_AMBULATORY_CARE_PROVIDER_SITE_OTHER): Payer: Medicare HMO | Admitting: "Endocrinology

## 2016-11-25 VITALS — BP 106/65 | HR 53 | Ht 62.0 in | Wt 115.0 lb

## 2016-11-25 DIAGNOSIS — E039 Hypothyroidism, unspecified: Secondary | ICD-10-CM

## 2016-11-25 DIAGNOSIS — E042 Nontoxic multinodular goiter: Secondary | ICD-10-CM

## 2016-11-25 MED ORDER — LEVOTHYROXINE SODIUM 112 MCG PO TABS: 112.0000 ug | ORAL_TABLET | Freq: Every day | ORAL | 6 refills | Status: DC

## 2016-11-25 NOTE — Progress Notes (Signed)
Subjective:    Patient ID: Evelyn [name], female    DOB: [birth year], PCP Terald Sleeper, PA-C   Past Medical History:  Diagnosis Date  . Aneurysm ([place])   . Aortic insufficiency   . CAD (coronary artery disease)    CABG LIMA to LAD,vein graft to diagonal,vein graft to OM  . Dyslipidemia   . GERD (gastroesophageal reflux disease)   . Hypothyroid    Past Surgical History:  Procedure Laterality Date  . ABDOMINAL HYSTERECTOMY    . BACK SURGERY  2009  . CORONARY ANGIOPLASTY  2003   cutting balloon atherectomy in-stent stenosis of the LAD.  [name] CORONARY ANGIOPLASTY WITH STENT PLACEMENT  2002   remote PCI & stenting LAD,  . CORONARY ARTERY BYPASS GRAFT  2004   LIMA to LAD,vein graft to diagonal,vein graft to OM  . FOOT SURGERY    . LEFT HEART CATHETERIZATION WITH CORONARY ANGIOGRAM N/A 12/15/2011   Procedure: LEFT HEART CATHETERIZATION WITH CORONARY ANGIOGRAM;  Surgeon: Sanda Klein, MD;  Location: [facility];  Service: Cardiovascular;  Laterality: N/A;  . NECK SURGERY    . NM MYOCAR PERF WALL MOTION  12/01/2011   normal study-persistent exercise induced ECG changes raises the concern for ischemia (balanced ischemia).  . NOSE SURGERY    . TONSILLECTOMY  1948  . TUBAL LIGATION     Social History   Social History  . Marital status: Married    Spouse name: N/A  . Number of children: 2  . Years of education: Associates   Occupational History  . Retired    Social History Main Topics  . Smoking status: Never Smoker  . Smokeless tobacco: Never Used  . Alcohol use No  . Drug use: No  . Sexual activity: Not Asked   Other Topics Concern  . None   Social History Narrative   Mother of 2 with 4 grandchildren   Right-handed   Caffeine: 8-10 cups coffee   Outpatient Encounter Prescriptions as of 11/25/2016  Medication Sig  . ALPRAZolam (XANAX) 0.25 MG tablet Take 1 tablet (0.25 mg total) by mouth 3 (three) times daily.  [name] aspirin EC 325 MG tablet Take 325 mg by mouth  daily.  . furosemide (LASIX) 20 MG tablet Take 1 tablet (20 mg total) by mouth daily.  [name] HYDROcodone-acetaminophen (NORCO/VICODIN) 5-325 MG tablet Take 1 tablet by mouth every 6 (six) hours as needed for moderate pain.  [name] levothyroxine (SYNTHROID, LEVOTHROID) 112 MCG tablet Take 1 tablet (112 mcg total) by mouth daily before breakfast.  . metoprolol tartrate (LOPRESSOR) 100 MG tablet TAKE 1 TABLET TWICE A DAY  . nitroGLYCERIN (NITROSTAT) 0.4 MG SL tablet Place 1 tablet (0.4 mg total) under the tongue every 5 (five) minutes as needed for chest pain.  . rosuvastatin (CRESTOR) 40 MG tablet Take 1 tablet (40 mg total) by mouth daily.  . Zoledronic Acid (RECLAST IV) Inject into the vein. Once yearly  . [DISCONTINUED] HYDROcodone-acetaminophen (NORCO/VICODIN) 5-325 MG tablet Take 1 tablet by mouth every 6 (six) hours as needed for moderate pain.  . [DISCONTINUED] HYDROcodone-acetaminophen (NORCO/VICODIN) 5-325 MG tablet Take 1 tablet by mouth every 6 (six) hours as needed for moderate pain.  . [DISCONTINUED] levothyroxine (SYNTHROID, LEVOTHROID) 75 MCG tablet Take 1 tablet (75 mcg total) by mouth daily before breakfast.  . [DISCONTINUED] penicillin v potassium (VEETID) 500 MG tablet Take 500 mg by mouth 4 (four) times daily.   No facility-administered encounter medications on file as of 11/25/2016.  ALLERGIES: Allergies  Allergen Reactions  . Sulfa Antibiotics Swelling   VACCINATION STATUS: Immunization History  Administered Date(s) Administered  . Influenza, High Dose Seasonal PF 03/09/2016    HPI  76 year old female with medical history as above. She is being seen in f/u  For  hypothyroidism and multinodular goiter confirmed by dedicated thyroid/neck ultrasound, after incidental finding of nodular goiter by a CT scan. - She is status post FNA 3 nodules in her thyroid. - She has history of hypothyroidism for the last 4 years currently on, levothyroxine 100 g by mouth every morning. - She  denies family history of thyroid dysfunction. She feels occasional choking sensation in her neck, no recent voice change nor shortness of breath. She denies any exposure to neck radiation. - She denies any history of smoking.  Review of Systems Constitutional: She has steady weight, no fatigue, no subjective hyperthermia/hypothermia Eyes: no blurry vision, no xerophthalmia ENT: no sore throat, + nodules palpated in throat,  + intermittent dysphagia/odynophagia, no hoarseness Cardiovascular: no CP/SOB/palpitations/leg swelling Respiratory: no cough/SOB Gastrointestinal: no N/V/D/C Musculoskeletal: no muscle/joint aches Skin: no rashes Neurological: no tremors/numbness/tingling/dizziness Psychiatric: no depression/anxiety  Objective:    BP 106/65   Pulse (!) 53   Ht _0  (1.575 m)   Wt 115 lb (52.2 kg)   BMI 21.03 kg/m   Wt Readings from Last 3 Encounters:  11/25/16 115 lb (52.2 kg)  11/23/16 114 lb 12.8 oz (52.1 kg)  09/17/16 113 lb 9.6 oz (51.5 kg)    Physical Exam  Constitutional:  in NAD 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, strength intact in all 4 Skin: moist, warm, no rashes Neurological: no tremor with outstretched hands, DTR normal in all 4  CMP     Component Value Date/Time   NA 136 09/01/2016 0550   NA 142 07/21/2016 0935   K 4.4 09/01/2016 0550   CL 105 09/01/2016 0550   CO2 23 09/01/2016 0550   GLUCOSE 99 09/01/2016 0550   BUN 10 09/01/2016 0550   BUN 8 07/21/2016 0935   CREATININE 0.63 09/01/2016 0550   CREATININE 0.59 07/11/2014 0913   CALCIUM 9.3 09/01/2016 0550   PROT 6.4 07/21/2016 0935   ALBUMIN 4.1 07/21/2016 0935   AST 12 07/21/2016 0935   ALT 13 07/21/2016 0935   ALKPHOS 55 07/21/2016 0935   BILITOT 0.4  07/21/2016 0935   GFRNONAA >60 09/01/2016 0550   GFRAA >60 09/01/2016 0550   Recent Results (from the past 2160 hour(s))  Basic metabolic panel     Status: None   Collection Time: 09/01/16  5:50 AM  Result Value Ref Range   Sodium 136 135 - 145 mmol/L   Potassium 4.4 3.5 - 5.1 mmol/L   Chloride 105 101 - 111 mmol/L   CO2 23 22 - 32 mmol/L   Glucose, Bld 99 65 - 99 mg/dL   BUN 10 6 - 20 mg/dL   Creatinine, Ser 0.63 0.44 - 1.00 mg/dL   Calcium 9.3 8.9 - 10.3 mg/dL   GFR calc non Af Amer >60 >60 mL/min   GFR calc Af Amer >60 >60 mL/min    Comment: (NOTE) The eGFR has been calculated using the CKD EPI equation. This calculation has not been validated in all clinical situations. eGFR's persistently <60 mL/min signify possible Chronic Kidney Disease.  Anion gap 8 5 - 15  CBC     Status: None   Collection Time: 09/01/16  5:50 AM  Result Value Ref Range   WBC 9.8 4.0 - 10.5 K/uL   RBC 4.13 3.87 - 5.11 MIL/uL   Hemoglobin 12.5 12.0 - 15.0 g/dL   HCT 38.0 36.0 - 46.0 %   MCV 92.0 78.0 - 100.0 fL   MCH 30.3 26.0 - 34.0 pg   MCHC 32.9 30.0 - 36.0 g/dL   RDW 14.4 11.5 - 15.5 %   Platelets 238 150 - 400 K/uL  Differential     Status: None   Collection Time: 09/01/16  5:50 AM  Result Value Ref Range   Neutrophils Relative % 62 %   Neutro Abs 5.7 1.7 - 7.7 K/uL   Lymphocytes Relative 30 %   Lymphs Abs 2.7 0.7 - 4.0 K/uL   Monocytes Relative 7 %   Monocytes Absolute 0.6 0.1 - 1.0 K/uL   Eosinophils Relative 1 %   Eosinophils Absolute 0.1 0.0 - 0.7 K/uL   Basophils Relative 0 %   Basophils Absolute 0.0 0.0 - 0.1 K/uL  I-stat troponin, ED     Status: None   Collection Time: 09/01/16  6:01 AM  Result Value Ref Range   Troponin i, poc 0.01 0.00 - 0.08 ng/mL   Comment 3            Comment: Due to the release kinetics of cTnI, a negative result within the first hours of the onset of symptoms does not rule out myocardial infarction with certainty. If myocardial infarction is  still suspected, repeat the test at appropriate intervals.   Protime-INR     Status: None   Collection Time: 09/01/16  6:25 AM  Result Value Ref Range   Prothrombin Time 13.6 11.4 - 15.2 seconds   INR 1.04   APTT     Status: Abnormal   Collection Time: 09/01/16  6:25 AM  Result Value Ref Range   aPTT 37 (H) 24 - 36 seconds    Comment:        IF BASELINE aPTT IS ELEVATED, SUGGEST PATIENT RISK ASSESSMENT BE USED TO DETERMINE APPROPRIATE ANTICOAGULANT THERAPY.   Urinalysis, Routine w reflex microscopic     Status: Abnormal   Collection Time: 09/01/16  8:49 AM  Result Value Ref Range   Color, Urine YELLOW YELLOW   APPearance CLEAR CLEAR   Specific Gravity, Urine <1.005 (L) 1.005 - 1.030   pH 5.5 5.0 - 8.0   Glucose, UA NEGATIVE NEGATIVE mg/dL   Hgb urine dipstick SMALL (A) NEGATIVE   Bilirubin Urine NEGATIVE NEGATIVE   Ketones, ur NEGATIVE NEGATIVE mg/dL   Protein, ur NEGATIVE NEGATIVE mg/dL   Nitrite NEGATIVE NEGATIVE   Leukocytes, UA TRACE (A) NEGATIVE  Urinalysis, Microscopic (reflex)     Status: Abnormal   Collection Time: 09/01/16  8:49 AM  Result Value Ref Range   RBC / HPF 0-5 0 - 5 RBC/hpf   WBC, UA 0-5 0 - 5 WBC/hpf   Bacteria, UA MANY (A) NONE SEEN   Squamous Epithelial / LPF 0-5 (A) NONE SEEN  Hemoglobin A1c     Status: Abnormal   Collection Time: 09/17/16 10:55 AM  Result Value Ref Range   Hgb A1c MFr Bld 5.8 (H) 4.8 - 5.6 %    Comment:          Pre-diabetes: 5.7 - 6.4          Diabetes: >6.4  Glycemic control for adults with diabetes: <7.0    Est. average glucose Bld gHb Est-mCnc 120 mg/dL  TSH     Status: Abnormal   Collection Time: 09/20/16 12:32 PM  Result Value Ref Range   TSH 0.174 (L) 0.450 - 4.500 uIU/mL  T4, Free     Status: None   Collection Time: 09/20/16 12:32 PM  Result Value Ref Range   Free T4 1.58 0.82 - 1.77 ng/dL  ECHOCARDIOGRAM LIMITED BUBBLE STUDY     Status: Abnormal   Collection Time: 10/12/16  9:07 AM  Result Value Ref  Range   LV PW d 10.6 (A) 0.6 - 1.1 mm   FS 35 28 - 44 %   Ao-asc 37 cm   LA ID, A-P, ES 34 mm   IVS/LV PW RATIO, ED .9    Reg peak vel 247 cm/s   LV e' LATERAL 8.84 cm/s   LV E/e' medial 6.23    LV E/e'average 6.23    LA diam index 2.27 cm/m2   E decel time 271 msec   E/e' ratio 6.23    MV pk E vel 55.1 m/s   TR max vel 247 cm/s   MV pk A vel 78.3 m/s   MV Dec 271    LA diam end sys 34.00 mm   TDI e' medial 5.45    TDI e' lateral 8.84   T4, Free     Status: None   Collection Time: 11/18/16 12:00 AM  Result Value Ref Range   Free T4 1.11 0.82 - 1.77 ng/dL  TSH     Status: Abnormal   Collection Time: 11/18/16 12:00 AM  Result Value Ref Range   TSH 6.580 (H) 0.450 - 4.500 uIU/mL   Fine-needle aspiration samples  of 3 nodules in her thyroid is benign   Assessment & Plan:   1. Other specified hypothyroidism 2. Multinodular goiter  Regarding her hypothyroidism, we discussed the need for thyroid hormone for life. - Based on her current thyroid function tests she will benefit from slight increase in her levothyroxine dose and I will prescribe levothyroxine 112 g by mouth every morning.   - We discussed about correct intake of levothyroxine, at fasting, with water, separated by at least 30 minutes from breakfast, and separated by more than 4 hours from calcium, iron, multivitamins, acid reflux medications (PPIs). -Patient is made aware of the fact that thyroid hormone replacement is needed for life, dose to be adjusted by periodic monitoring of thyroid function tests.  - She was found to have clinical goiter last visit and one of her complaints was intermittent choking sensation in her neck. She just  underwent dedicated thyroid/neck ultrasound which is significant for multinodular goiter. Two nodules on the right (3.3 cm and 1.5 cm); one nodule on the left (2.8 cm) - Biopsy off  3 nodules is negative for malignancy. He will not need intervention at this time. She may need repeat  ultrasound 1-2 years from now. - I advised patient to maintain close follow up with Terald Sleeper, PA-C for primary care needs. Follow up plan: Return in about 6 months (around 05/28/2017) for follow up with pre-visit labs.  Glade Lloyd, MD Phone: [phone]  Fax: [phone]   11/25/2016, 11:54 AM

## 2017-01-24 ENCOUNTER — Telehealth: Payer: Self-pay | Admitting: Physician Assistant

## 2017-01-24 NOTE — Telephone Encounter (Signed)
What symptoms do you have?pt thought she had bug bites but she has them all over her leg some have pus at first others have black spot in the middle, they are itching also she has been using cortizone but it is not working  How long have you been sick? The bites have been coming up on her about a week  Have you been seen for this problem? no  If your provider decides to give you a prescription, which [facility]   Patient informed that this information will be sent to the clinical staff for review and that they should receive a follow up call.

## 2017-01-24 NOTE — Telephone Encounter (Signed)
[facility]

## 2017-01-25 ENCOUNTER — Encounter: Payer: Self-pay | Admitting: Physician Assistant

## 2017-01-25 ENCOUNTER — Ambulatory Visit (INDEPENDENT_AMBULATORY_CARE_PROVIDER_SITE_OTHER): Payer: Medicare HMO | Admitting: Physician Assistant

## 2017-01-25 VITALS — BP 110/63 | HR 64 | Temp 97.5°F | Ht 62.0 in | Wt 113.0 lb

## 2017-01-25 DIAGNOSIS — L309 Dermatitis, unspecified: Secondary | ICD-10-CM

## 2017-01-25 MED ORDER — METHYLPREDNISOLONE ACETATE 80 MG/ML IJ SUSP
80.0000 mg | Freq: Once | INTRAMUSCULAR | Status: AC
  Administered 2017-01-25: 80 mg via INTRAMUSCULAR

## 2017-01-25 MED ORDER — LORATADINE 10 MG PO TABS: 10.0000 mg | ORAL_TABLET | Freq: Every day | ORAL | 11 refills | Status: DC

## 2017-01-25 MED ORDER — HYDROCORTISONE 2.5 % EX CREA
TOPICAL_CREAM | Freq: Two times a day (BID) | CUTANEOUS | 0 refills | Status: DC
Start: 2017-01-25 — End: 2017-10-07

## 2017-01-25 NOTE — Patient Instructions (Signed)

## 2017-01-25 NOTE — Progress Notes (Signed)
BP 110/63   Pulse 64   Temp (!) 97.5 F (36.4 C) (Oral)   Ht  (1.575 m)   Wt 113 lb (51.3 kg)   BMI 20.67 kg/m    Subjective:    Patient ID: Evelyn [name], female    DOB: [birth year], 76 y.o.   MRN: [id number]  HPI: Evelyn [name] is a 76 y.o. female presenting on 01/25/2017 for Bites (itchy bites all over body)  Over the past week the patient has had multiple red lesions on her legs and arms and back. She does not have any exposure to something like scabies or lice. She has checked her dog for fleas. She has treated all of her bed linens. The areas will start out with a red papule and then blister up at times. Not everyone will blister. She is experiencing severe itching with the lesions  Relevant past medical, surgical, family and social history reviewed and updated as indicated. Allergies and medications reviewed and updated.  Past Medical History:  Diagnosis Date  . Aneurysm ([facility])   . Aortic insufficiency   . CAD (coronary artery disease)    CABG LIMA to LAD,vein graft to diagonal,vein graft to OM  . Dyslipidemia   . GERD (gastroesophageal reflux disease)   . Hypothyroid     Past Surgical History:  Procedure Laterality Date  . ABDOMINAL HYSTERECTOMY    . BACK SURGERY  2009  . CORONARY ANGIOPLASTY  2003   cutting balloon atherectomy in-stent stenosis of the LAD.  [name] CORONARY ANGIOPLASTY WITH STENT PLACEMENT  2002   remote PCI & stenting LAD,  . CORONARY ARTERY BYPASS GRAFT  2004   LIMA to LAD,vein graft to diagonal,vein graft to OM  . FOOT SURGERY    . LEFT HEART CATHETERIZATION WITH CORONARY ANGIOGRAM N/A 12/15/2011   Procedure: LEFT HEART CATHETERIZATION WITH CORONARY ANGIOGRAM;  Surgeon: Thurmon Fair, MD;  Location: [facility];  Service: Cardiovascular;  Laterality: N/A;  . NECK SURGERY    . NM MYOCAR PERF WALL MOTION  12/01/2011   normal study-persistent exercise induced ECG changes raises the concern for ischemia (balanced ischemia).  . NOSE SURGERY      . TONSILLECTOMY  1948  . TUBAL LIGATION      Review of Systems  Constitutional: Negative.   HENT: Negative.   Eyes: Negative.   Respiratory: Negative.   Gastrointestinal: Negative.   Genitourinary: Negative.   Skin: Positive for color change, rash and wound.    Allergies as of 01/25/2017      Reactions   Sulfa Antibiotics Swelling      Medication List       Accurate as of 01/25/17  9:55 AM. Always use your most recent med list.          ALPRAZolam 0.25 MG tablet Commonly known as:  XANAX Take 1 tablet (0.25 mg total) by mouth 3 (three) times daily.   aspirin EC 325 MG tablet Take 325 mg by mouth daily.   furosemide 20 MG tablet Commonly known as:  LASIX Take 1 tablet (20 mg total) by mouth daily.   HYDROcodone-acetaminophen 5-325 MG tablet Commonly known as:  NORCO/VICODIN Take 1 tablet by mouth every 6 (six) hours as needed for moderate pain.   hydrocortisone 2.5 % cream Apply topically 2 (two) times daily.   levothyroxine 112 MCG tablet Commonly known as:  SYNTHROID, LEVOTHROID Take 1 tablet (112 mcg total) by mouth daily before breakfast.   loratadine 10 MG tablet Commonly  known as:  CLARITIN Take 1 tablet (10 mg total) by mouth daily.   metoprolol tartrate 100 MG tablet Commonly known as:  LOPRESSOR TAKE 1 TABLET TWICE A DAY   nitroGLYCERIN 0.4 MG SL tablet Commonly known as:  NITROSTAT Place 1 tablet (0.4 mg total) under the tongue every 5 (five) minutes as needed for chest pain.   RECLAST IV Inject into the vein. Once yearly   rosuvastatin 40 MG tablet Commonly known as:  CRESTOR Take 1 tablet (40 mg total) by mouth daily.            Discharge Care Instructions        Start     Ordered   01/25/17 0945  METHYLPREDNISOLONE ACETATE 80 MG/ML IJ SUSP   Once     01/25/17 0936   01/25/17 0000  loratadine (CLARITIN) 10 MG tablet  Daily    Question:  Supervising Provider  Answer:  Elenora GammaBRADSHAW, SAMUEL L   01/25/17 0936   01/25/17 0000   hydrocortisone 2.5 % cream  2 times daily    Question:  Supervising Provider  Answer:  Elenora GammaBRADSHAW, SAMUEL L   01/25/17 0936         Objective:    BP 110/63   Pulse 64   Temp (!) 97.5 F (36.4 C) (Oral)   Ht 5\' 2"  (1.575 m)   Wt 113 lb (51.3 kg)   BMI 20.67 kg/m   Allergies  Allergen Reactions  . Sulfa Antibiotics Swelling    Physical Exam  Constitutional: She is oriented to person, place, and time. She appears well-developed and well-nourished.  HENT:  Head: Normocephalic and atraumatic.  Eyes: Pupils are equal, round, and reactive to light. Conjunctivae and EOM are normal.  Cardiovascular: Normal rate, regular rhythm, normal heart sounds and intact distal pulses.   Pulmonary/Chest: Effort normal and breath sounds normal.  Abdominal: Soft. Bowel sounds are normal.  Neurological: She is alert and oriented to person, place, and time. She has normal reflexes.  Skin: Skin is warm and dry. Rash noted. Rash is papular and pustular. There is erythema.  Some lesions with blistering around the ankles and arm and one in the low back.  Psychiatric: She has a normal mood and affect. Her behavior is normal. Judgment and thought content normal.        Assessment & Plan:   1. Eczema, unspecified type - loratadine (CLARITIN) 10 MG tablet; Take 1 tablet (10 mg total) by mouth daily.  Dispense: 30 tablet; Refill: 11 - hydrocortisone 2.5 % cream; Apply topically 2 (two) times daily.  Dispense: 30 g; Refill: 0 - methylPREDNISolone acetate (DEPO-MEDROL) injection 80 mg; Inject 1 mL (80 mg total) into the muscle once.    Current Outpatient Prescriptions:  .  ALPRAZolam (XANAX) 0.25 MG tablet, Take 1 tablet (0.25 mg total) by mouth 3 (three) times daily., Disp: 90 tablet, Rfl: 1 .  aspirin EC 325 MG tablet, Take 325 mg by mouth daily., Disp: , Rfl:  .  furosemide (LASIX) 20 MG tablet, Take 1 tablet (20 mg total) by mouth daily., Disp: 90 tablet, Rfl: 3 .  HYDROcodone-acetaminophen  (NORCO/VICODIN) 5-325 MG tablet, Take 1 tablet by mouth every 6 (six) hours as needed for moderate pain., Disp: 120 tablet, Rfl: 0 .  levothyroxine (SYNTHROID, LEVOTHROID) 112 MCG tablet, Take 1 tablet (112 mcg total) by mouth daily before breakfast., Disp: 30 tablet, Rfl: 6 .  metoprolol tartrate (LOPRESSOR) 100 MG tablet, TAKE 1 TABLET TWICE A DAY, Disp: 60  tablet, Rfl: 3 .  nitroGLYCERIN (NITROSTAT) 0.4 MG SL tablet, Place 1 tablet (0.4 mg total) under the tongue every 5 (five) minutes as needed for chest pain., Disp: 25 tablet, Rfl: 3 .  rosuvastatin (CRESTOR) 40 MG tablet, Take 1 tablet (40 mg total) by mouth daily., Disp: 30 tablet, Rfl: 11 .  Zoledronic Acid (RECLAST IV), Inject into the vein. Once yearly, Disp: , Rfl:  .  hydrocortisone 2.5 % cream, Apply topically 2 (two) times daily., Disp: 30 g, Rfl: 0 .  loratadine (CLARITIN) 10 MG tablet, Take 1 tablet (10 mg total) by mouth daily., Disp: 30 tablet, Rfl: 11 Continue all other maintenance medications as listed above.  Follow up plan: No Follow-up on file.  Educational handout given for eczema  Remus Loffler PA-C [facility] [address]  [place], [place] [address] [phone]   01/25/2017, 9:55 AM

## 2017-01-28 ENCOUNTER — Telehealth: Payer: Self-pay | Admitting: Cardiovascular Disease

## 2017-01-28 NOTE — Telephone Encounter (Signed)
New message    Pt c/o Shortness Of Breath: STAT if SOB developed within the last 24 hours or pt is noticeably SOB on the phone  1. Are you currently SOB (can you hear that pt is SOB on the phone)? Not at the moment  2. How long have you been experiencing SOB? About a week  3. Are you SOB when sitting or when up moving around? Moving around  4. Are you currently experiencing any other symptoms? No    Pt scheduled an appt on Tuesday at 9 am with Dr. Royann Shivers.

## 2017-01-28 NOTE — Telephone Encounter (Signed)
Spoke to patient- okay with appointment- no issues at presenttime

## 2017-02-01 ENCOUNTER — Encounter: Payer: Self-pay | Admitting: Cardiovascular Disease

## 2017-02-01 ENCOUNTER — Ambulatory Visit (INDEPENDENT_AMBULATORY_CARE_PROVIDER_SITE_OTHER): Payer: Medicare HMO | Admitting: Cardiovascular Disease

## 2017-02-01 VITALS — BP 110/68 | HR 58 | Ht 62.0 in | Wt 112.2 lb

## 2017-02-01 DIAGNOSIS — I351 Nonrheumatic aortic (valve) insufficiency: Secondary | ICD-10-CM | POA: Diagnosis not present

## 2017-02-01 DIAGNOSIS — E785 Hyperlipidemia, unspecified: Secondary | ICD-10-CM

## 2017-02-01 DIAGNOSIS — I712 Thoracic aortic aneurysm, without rupture, unspecified: Secondary | ICD-10-CM

## 2017-02-01 DIAGNOSIS — I25728 Atherosclerosis of autologous artery coronary artery bypass graft(s) with other forms of angina pectoris: Secondary | ICD-10-CM | POA: Diagnosis not present

## 2017-02-01 DIAGNOSIS — I10 Essential (primary) hypertension: Secondary | ICD-10-CM | POA: Diagnosis not present

## 2017-02-01 NOTE — Patient Instructions (Addendum)
Dr Royann Shivers recommends that you have a Coronary CT Angiogram. This will be performed at [facility]. This has to be submitted to your insurance company for approval prior to scheduling.   You will need to have blood work done prior to the test.  Dr Royann Shivers recommends that you schedule a follow-up appointment in 6 months. You will receive a reminder letter in the mail two months in advance. If you don't receive a letter, please call our office to schedule the follow-up appointment.  If you need a refill on your cardiac medications before your next appointment, please call your pharmacy.  Cardiac CT Angiogram A cardiac CT angiogram is a procedure to look at the heart and the area around the heart. It may be done to help find the cause of chest pains or other symptoms of heart disease. During this procedure, a large X-ray machine, called a CT scanner, takes detailed pictures of the heart and the surrounding area after a dye (contrast material) has been injected into blood vessels in the area. The procedure is also sometimes called a coronary CT angiogram, coronary artery scanning, or CTA. A cardiac CT angiogram allows the health care provider to see how well blood is flowing to and from the heart. The health care provider will be able to see if there are any problems, such as:  Blockage or narrowing of the coronary arteries in the heart.  Fluid around the heart.  Signs of weakness or disease in the muscles, valves, and tissues of the heart.  Tell a health care provider about:  Any allergies you have. This is especially important if you have had a previous allergic reaction to contrast dye.  All medicines you are taking, including vitamins, herbs, eye drops, creams, and over-the-counter medicines.  Any blood disorders you have.  Any surgeries you have had.  Any medical conditions you have.  Whether you are pregnant or may be pregnant.  Any anxiety disorders, chronic pain, or  other conditions you have that may increase your stress or prevent you from lying still. What are the risks? Generally, this is a safe procedure. However, problems may occur, including:  Bleeding.  Infection.  Allergic reactions to medicines or dyes.  Damage to other structures or organs.  Kidney damage from the dye or contrast that is used.  Increased risk of cancer from radiation exposure. This risk is low. Talk with your health care provider about: ? The risks and benefits of testing. ? How you can receive the lowest dose of radiation.  What happens before the procedure?  Wear comfortable clothing and remove any jewelry, glasses, dentures, and hearing aids.  Follow instructions from your health care provider about eating and drinking. This may include: ? For 12 hours before the test - avoid caffeine. This includes tea, coffee, soda, energy drinks, and diet pills. Drink plenty of water or other fluids that do not have caffeine in them. Being well-hydrated can prevent complications. ? For 4-6 hours before the test - stop eating and drinking. The contrast dye can cause nausea, but this is less likely if your stomach is empty.  Ask your health care provider about changing or stopping your regular medicines. This is especially important if you are taking diabetes medicines, blood thinners, or medicines to treat erectile dysfunction. What happens during the procedure?  Hair on your chest may need to be removed so that small sticky patches called electrodes can be placed on your chest. These will transmit information that  helps to monitor your heart during the test.  An IV tube will be inserted into one of your veins.  You might be given a medicine to control your heart rate during the test. This will help to ensure that good images are obtained.  You will be asked to lie on an exam table. This table will slide in and out of the CT machine during the procedure.  Contrast dye will be  injected into the IV tube. You might feel warm, or you may get a metallic taste in your mouth.  You will be given a medicine (nitroglycerin) to relax (dilate) the arteries in your heart.  The table that you are lying on will move into the CT machine tunnel for the scan.  The person running the machine will give you instructions while the scans are being done. You may be asked to: ? Keep your arms above your head. ? Hold your breath. ? Stay very still, even if the table is moving.  When the scanning is complete, you will be moved out of the machine.  The IV tube will be removed. The procedure may vary among health care providers and hospitals. What happens after the procedure?  You might feel warm, or you may get a metallic taste in your mouth from the contrast dye.  You may have a headache from the nitroglycerin.  After the procedure, drink water or other fluids to wash (flush) the contrast material out of your body.  Contact a health care provider if you have any symptoms of allergy to the contrast. These symptoms include: ? Shortness of breath. ? Rash or hives. ? A racing heartbeat.  Most people can return to their normal activities right after the procedure. Ask your health care provider what activities are safe for you.  It is up to you to get the results of your procedure. Ask your health care provider, or the department that is doing the procedure, when your results will be ready. Summary  A cardiac CT angiogram is a procedure to look at the heart and the area around the heart. It may be done to help find the cause of chest pains or other symptoms of heart disease.  During this procedure, a large X-ray machine, called a CT scanner, takes detailed pictures of the heart and the surrounding area after a dye (contrast material) has been injected into blood vessels in the area.  Ask your health care provider about changing or stopping your regular medicines before the procedure.  This is especially important if you are taking diabetes medicines, blood thinners, or medicines to treat erectile dysfunction.  After the procedure, drink water or other fluids to wash (flush) the contrast material out of your body. This information is not intended to replace advice given to you by your health care provider. Make sure you discuss any questions you have with your health care provider. Document Released: 04/15/2008 Document Revised: 03/22/2016 Document Reviewed: 03/22/2016 Elsevier Interactive Patient Education  2017 [employer].

## 2017-02-01 NOTE — Progress Notes (Signed)
Cardiology Office Note    Date:  02/01/2017   ID:  Evelyn [name], DOB 04-24-1941, MRN [id number]  PCP:  Remus Loffler, PA-C  Cardiologist:   Thurmon Fair, MD   Chief Complaint  Patient presents with  . Follow-up    pt reports increased SOBOE w/ minimal exertion and chest pain several episodes - most recent 8/10 w/ 2 NTG tabs w/ relief    History of Present Illness:  Evelyn [name] is a 76 y.o. female with long-standing history of coronary artery disease with previous stents to LAD followed by bypass surgery (LIMA to LAD, SVG to diagonal, SVG to OM in 2004), hyperlipidemia here for A recent episode of exertional dyspnea and chest discomfort.  She was walking up a slope when she developed a burning/hurting sensation in the retrosternal area as well as dyspnea. She sat down and took 2 sublingual nitroglycerin with relief of symptoms after about 15 minutes. She has also noticed increasing dyspnea on exertion on other occasions as well, sometimes just walking to her mailbox.  Had an echocardiogram in May, showing normal LVEF 55-60%, with inferior and inferoseptal mild hypokinesis, mildly dilated ascending aorta, mild aortic insufficiency. In September 2017 her treadmill nuclear stress test showed normal myocardial perfusion and EF 62%. In October 2017 she had the last evaluation of her aorta, showing mild aneurysmal dilation of the ascending aorta at 3.7 cm and of the aortic arch at 3.3 cm. (She is a very small frame lady, 5 foot 2 inches tall, 112 pounds).  Her last cardiac catheterization was in July 2013 and showed total occlusion of the proximal LAD artery with patent distal bypasses (LIMA to LAD, SVG to diagonal, SVG to OM)  The patient specifically denies any chest pain at rest, dyspnea at rest, orthopnea, paroxysmal nocturnal dyspnea, syncope, palpitations, focal neurological deficits, intermittent claudication, lower extremity edema (right leg, site of saphenectomy harvest sites  is always a little larger), unexplained weight gain, cough, hemoptysis or wheezing.   She has long-standing history of coronary disease, undergoing her first cardiac catheterization in 1999. She received stents to the LAD artery in 2002 and had to have cutting balloon atherectomy for in-stent restenosis in 2003 followed by a new LAD stent in 2004 for disease progression. Ultimately after again developing restenosis she underwent coronary bypass surgery (LIMA to LAD, SVG to diagonal, SVG to OM in 2004). She had cardiac catheterization in July 2013. This demonstrated interval total occlusion of the proximal LAD artery although the distant bypasses are all widely patent. On her stress test in 2013 and 2017  the perfusion images were "normal". It is felt that she may have occasional angina pectoris related to ischemia in the proximal septum since there is no retrograde flow from the bypasses to the proximal LAD. Her last echo in May 2018 showed normal EF 55-60%, but reported inferior and inferoseptal mild hypokinesis. 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  CT angiogram of the head and neck performed in May 2018 for TIA showed mild extracranial atherosclerosis. She has treated systemic hypertension and hyperlipidemia and hypothyroidism.  She had previous cervical spine surgery with bone grafting and discectomy in 2009.   Past Medical History:  Diagnosis Date  . Aneurysm (HCC)   . Aortic insufficiency   . CAD (coronary artery disease)    CABG LIMA to LAD,vein graft to diagonal,vein graft to OM  . Dyslipidemia   . GERD (gastroesophageal reflux  disease)   . Hypothyroid     Past Surgical History:  Procedure Laterality Date  . ABDOMINAL HYSTERECTOMY    . BACK SURGERY  2009  . CORONARY ANGIOPLASTY  2003   cutting balloon atherectomy in-stent stenosis of the LAD.  [name] CORONARY ANGIOPLASTY WITH STENT PLACEMENT  2002   remote PCI & stenting LAD,  .  CORONARY ARTERY BYPASS GRAFT  2004   LIMA to LAD,vein graft to diagonal,vein graft to OM  . FOOT SURGERY    . LEFT HEART CATHETERIZATION WITH CORONARY ANGIOGRAM N/A 12/15/2011   Procedure: LEFT HEART CATHETERIZATION WITH CORONARY ANGIOGRAM;  Surgeon: Thurmon Fair, MD;  Location: [facility] CATH [facility];  Service: Cardiovascular;  Laterality: N/A;  . NECK SURGERY    . NM MYOCAR PERF WALL MOTION  12/01/2011   normal study-persistent exercise induced ECG changes raises the concern for ischemia (balanced ischemia).  . NOSE SURGERY    . TONSILLECTOMY  1948  . TUBAL LIGATION      Current Medications: Outpatient Medications Prior to Visit  Medication Sig Dispense Refill  . ALPRAZolam (XANAX) 0.25 MG tablet Take 1 tablet (0.25 mg total) by mouth 3 (three) times daily. 90 tablet 1  . aspirin EC 325 MG tablet Take 325 mg by mouth daily.    . furosemide (LASIX) 20 MG tablet Take 1 tablet (20 mg total) by mouth daily. 90 tablet 3  . HYDROcodone-acetaminophen (NORCO/VICODIN) 5-325 MG tablet Take 1 tablet by mouth every 6 (six) hours as needed for moderate pain. 120 tablet 0  . hydrocortisone 2.5 % cream Apply topically 2 (two) times daily. 30 g 0  . levothyroxine (SYNTHROID, LEVOTHROID) 112 MCG tablet Take 1 tablet (112 mcg total) by mouth daily before breakfast. 30 tablet 6  . loratadine (CLARITIN) 10 MG tablet Take 1 tablet (10 mg total) by mouth daily. 30 tablet 11  . metoprolol tartrate (LOPRESSOR) 100 MG tablet TAKE 1 TABLET TWICE A DAY 60 tablet 3  . nitroGLYCERIN (NITROSTAT) 0.4 MG SL tablet Place 1 tablet (0.4 mg total) under the tongue every 5 (five) minutes as needed for chest pain. 25 tablet 3  . rosuvastatin (CRESTOR) 40 MG tablet Take 1 tablet (40 mg total) by mouth daily. 30 tablet 11  . Zoledronic Acid (RECLAST IV) Inject into the vein. Once yearly     No facility-administered medications prior to visit.      Allergies:   Sulfa antibiotics   Social History   Social History  . Marital  status: Married    Spouse name: N/A  . Number of children: 2  . Years of education: Associates   Occupational History  . Retired    Social History Main Topics  . Smoking status: Never Smoker  . Smokeless tobacco: Never Used  . Alcohol use No  . Drug use: No  . Sexual activity: Not Asked   Other Topics Concern  . None   Social History Narrative   Mother of 2 with 4 grandchildren   Right-handed   Caffeine: 8-10 cups coffee     Family History:  The patient's family history includes Asthma in her maternal grandmother; Heart attack in her mother; Heart failure in her father; Hypertension in her father.   ROS:   Please see the history of present illness.    ROS All other systems reviewed and are negative.   PHYSICAL EXAM:   VS:  BP 110/68   Pulse (!) 58   Ht  (1.575 m)   Wt 112  lb 3.2 oz (50.9 kg)   BMI 20.52 kg/m     General: Alert, oriented x3, no distress, Very slender and appears fit and younger than stated age Head: no evidence of trauma, PERRL, EOMI, no exophtalmos or lid lag, no myxedema, no xanthelasma; normal ears, nose and oropharynx Neck: normal jugular venous pulsations and no hepatojugular reflux; brisk carotid pulses without delay and no carotid bruits Chest: clear to auscultation, no signs of consolidation by percussion or palpation, normal fremitus, symmetrical and full respiratory excursions Cardiovascular: normal position and quality of the apical impulse, regular rhythm, normal first and second heart sounds, no murmurs, rubs or gallops Abdomen: no tenderness or distention, no masses by palpation, no abnormal pulsatility or arterial bruits, normal bowel sounds, no hepatosplenomegaly Extremities: no clubbing, cyanosis or edema; 2+ radial, ulnar and brachial pulses bilaterally; 2+ right femoral, posterior tibial and dorsalis pedis pulses; 2+ left femoral, posterior tibial and dorsalis pedis pulses; no subclavian or femoral bruits Neurological: grossly  nonfocal Psych: Normal mood and affect   Wt Readings from Last 3 Encounters:  02/01/17 112 lb 3.2 oz (50.9 kg)  01/25/17 113 lb (51.3 kg)  11/25/16 115 lb (52.2 kg)      Studies/Labs Reviewed:   EKG:  EKG is ordered today.  The ekg ordered today demonstrates Sinus rhythm, chronic T-wave inversion in leads V1-V3, not changed from previous tracings. Recent Labs: 07/21/2016: ALT 13 09/01/2016: BUN 10; Creatinine, Ser 0.63; Hemoglobin 12.5; Platelets 238; Potassium 4.4; Sodium 136 11/18/2016: TSH 6.580   Lipid Panel    Component Value Date/Time   CHOL 157 07/21/2016 0935   TRIG 76 07/21/2016 0935   HDL 56 07/21/2016 0935   CHOLHDL 2.8 07/21/2016 0935   CHOLHDL 2.0 07/11/2014 0913   VLDL 12 07/11/2014 0913   LDLCALC 86 07/21/2016 0935     ASSESSMENT:    1. Thoracic aortic aneurysm without rupture (HCC)   2. Coronary artery disease involving autologous artery coronary bypass graft with other forms of angina pectoris (HCC)   3. Essential hypertension   4. Dyslipidemia   5. Nonrheumatic aortic valve insufficiency      PLAN:  In order of problems listed above:  1. Asc Ao Aneurysm: Even though the absolute measurement of the aorta is not very impressive, she is a very small framed person and yearly CT angiography is reasonable, especially since she is having some symptoms. 2. CAD s/p CABG: low-risk stress Myoview study in September 2017, but she is known to have occlusion of the proximal LAD and probably has proximal septal ischemia. We'll see if we can reevaluate her major conduits during the planned CT angiogram. We'll get a coronary dedicated gated study that should serve both to measure the aneurysm and to look for major new occlusive disease. She is already on beta blockers and is relatively bradycardic which should make for easy irrigating. 3. HTN: Excellent control on high-dose beta blocker. If symptoms become more persistent, consider adding a low-dose of amlodipine for  possible vasospasm prevention and antianginal effect. 4. HLP: Her LDL is not ideal, but is substantially lower than at baseline with a better than 50% reduction. Continue rosuvastatin maximum dose. 5. AI: mild by physical exam and by echo.   Medication Adjustments/Labs and Tests Ordered: Current medicines are reviewed at length with the patient today.  Concerns regarding medicines are outlined above.  Medication changes, Labs and Tests ordered today are listed in the Patient Instructions below. Patient Instructions  Dr Royann Shivers recommends that you have  a Coronary CT Angiogram. This will be performed at [facility]. This has to be submitted to your insurance company for approval prior to scheduling.   You will need to have blood work done prior to the test.  Dr Royann Shivers recommends that you schedule a follow-up appointment in 6 months. You will receive a reminder letter in the mail two months in advance. If you don't receive a letter, please call our office to schedule the follow-up appointment.  If you need a refill on your cardiac medications before your next appointment, please call your pharmacy.  Cardiac CT Angiogram A cardiac CT angiogram is a procedure to look at the heart and the area around the heart. It may be done to help find the cause of chest pains or other symptoms of heart disease. During this procedure, a large X-ray machine, called a CT scanner, takes detailed pictures of the heart and the surrounding area after a dye (contrast material) has been injected into blood vessels in the area. The procedure is also sometimes called a coronary CT angiogram, coronary artery scanning, or CTA. A cardiac CT angiogram allows the health care provider to see how well blood is flowing to and from the heart. The health care provider will be able to see if there are any problems, such as:  Blockage or narrowing of the coronary arteries in the heart.  Fluid around the heart.  Signs of  weakness or disease in the muscles, valves, and tissues of the heart.  Tell a health care provider about:  Any allergies you have. This is especially important if you have had a previous allergic reaction to contrast dye.  All medicines you are taking, including vitamins, herbs, eye drops, creams, and over-the-counter medicines.  Any blood disorders you have.  Any surgeries you have had.  Any medical conditions you have.  Whether you are pregnant or may be pregnant.  Any anxiety disorders, chronic pain, or other conditions you have that may increase your stress or prevent you from lying still. What are the risks? Generally, this is a safe procedure. However, problems may occur, including:  Bleeding.  Infection.  Allergic reactions to medicines or dyes.  Damage to other structures or organs.  Kidney damage from the dye or contrast that is used.  Increased risk of cancer from radiation exposure. This risk is low. Talk with your health care provider about: ? The risks and benefits of testing. ? How you can receive the lowest dose of radiation.  What happens before the procedure?  Wear comfortable clothing and remove any jewelry, glasses, dentures, and hearing aids.  Follow instructions from your health care provider about eating and drinking. This may include: ? For 12 hours before the test - avoid caffeine. This includes tea, coffee, soda, energy drinks, and diet pills. Drink plenty of water or other fluids that do not have caffeine in them. Being well-hydrated can prevent complications. ? For 4-6 hours before the test - stop eating and drinking. The contrast dye can cause nausea, but this is less likely if your stomach is empty.  Ask your health care provider about changing or stopping your regular medicines. This is especially important if you are taking diabetes medicines, blood thinners, or medicines to treat erectile dysfunction. What happens during the  procedure?  Hair on your chest may need to be removed so that small sticky patches called electrodes can be placed on your chest. These will transmit information that helps to monitor your heart  during the test.  An IV tube will be inserted into one of your veins.  You might be given a medicine to control your heart rate during the test. This will help to ensure that good images are obtained.  You will be asked to lie on an exam table. This table will slide in and out of the CT machine during the procedure.  Contrast dye will be injected into the IV tube. You might feel warm, or you may get a metallic taste in your mouth.  You will be given a medicine (nitroglycerin) to relax (dilate) the arteries in your heart.  The table that you are lying on will move into the CT machine tunnel for the scan.  The person running the machine will give you instructions while the scans are being done. You may be asked to: ? Keep your arms above your head. ? Hold your breath. ? Stay very still, even if the table is moving.  When the scanning is complete, you will be moved out of the machine.  The IV tube will be removed. The procedure may vary among health care providers and hospitals. What happens after the procedure?  You might feel warm, or you may get a metallic taste in your mouth from the contrast dye.  You may have a headache from the nitroglycerin.  After the procedure, drink water or other fluids to wash (flush) the contrast material out of your body.  Contact a health care provider if you have any symptoms of allergy to the contrast. These symptoms include: ? Shortness of breath. ? Rash or hives. ? A racing heartbeat.  Most people can return to their normal activities right after the procedure. Ask your health care provider what activities are safe for you.  It is up to you to get the results of your procedure. Ask your health care provider, or the department that is doing the  procedure, when your results will be ready. Summary  A cardiac CT angiogram is a procedure to look at the heart and the area around the heart. It may be done to help find the cause of chest pains or other symptoms of heart disease.  During this procedure, a large X-ray machine, called a CT scanner, takes detailed pictures of the heart and the surrounding area after a dye (contrast material) has been injected into blood vessels in the area.  Ask your health care provider about changing or stopping your regular medicines before the procedure. This is especially important if you are taking diabetes medicines, blood thinners, or medicines to treat erectile dysfunction.  After the procedure, drink water or other fluids to wash (flush) the contrast material out of your body. This information is not intended to replace advice given to you by your health care provider. Make sure you discuss any questions you have with your health care provider. Document Released: 04/15/2008 Document Revised: 03/22/2016 Document Reviewed: 03/22/2016 Elsevier Interactive Patient Education  [address]    Signed, Thurmon Fair, MD  02/01/2017 10:33 AM    [facility] [address] [place], [place], [place]  [address] Phone: [phone]; Fax: [phone]

## 2017-02-07 ENCOUNTER — Encounter: Payer: Self-pay | Admitting: Cardiovascular Disease

## 2017-02-10 DIAGNOSIS — I712 Thoracic aortic aneurysm, without rupture: Secondary | ICD-10-CM | POA: Diagnosis not present

## 2017-02-10 LAB — CREATININE, SERUM
Creatinine, Ser: 0.62 mg/dL (ref 0.57–1.00)
GFR calc Af Amer: 100 mL/min/{1.73_m2} (ref >59)
GFR, EST NON AFRICAN AMERICAN: 87 mL/min/{1.73_m2} (ref >59)

## 2017-02-14 ENCOUNTER — Ambulatory Visit (HOSPITAL_COMMUNITY)
Admission: RE | Admit: 2017-02-14 | Discharge: 2017-02-14 | Disposition: A | Discharge status: Home / Self Care | Payer: Medicare HMO | Source: Ambulatory Visit | Attending: Cardiovascular Disease | Admitting: Cardiovascular Disease

## 2017-02-14 DIAGNOSIS — I25728 Atherosclerosis of autologous artery coronary artery bypass graft(s) with other forms of angina pectoris: Secondary | ICD-10-CM | POA: Diagnosis not present

## 2017-02-14 DIAGNOSIS — I712 Thoracic aortic aneurysm, without rupture, unspecified: Secondary | ICD-10-CM

## 2017-02-14 DIAGNOSIS — Q2549 Other congenital malformations of aorta: Secondary | ICD-10-CM | POA: Diagnosis not present

## 2017-02-14 DIAGNOSIS — R079 Chest pain, unspecified: Secondary | ICD-10-CM | POA: Diagnosis not present

## 2017-02-14 MED ORDER — IOPAMIDOL (ISOVUE-370) INJECTION 76%
INTRAVENOUS | Status: AC
  Administered 2017-02-14: 80 mL via INTRAVENOUS
  Filled 2017-02-14: qty 100

## 2017-02-14 MED ORDER — NITROGLYCERIN 0.4 MG SL SUBL
SUBLINGUAL_TABLET | SUBLINGUAL | Status: AC
  Filled 2017-02-14: qty 2

## 2017-02-14 MED ORDER — NITROGLYCERIN 0.4 MG SL SUBL
0.8000 mg | SUBLINGUAL_TABLET | Freq: Once | SUBLINGUAL | Status: AC
  Administered 2017-02-14: 0.8 mg via SUBLINGUAL

## 2017-02-18 ENCOUNTER — Ambulatory Visit (HOSPITAL_COMMUNITY)
Admission: RE | Admit: 2017-02-18 | Discharge: 2017-02-18 | Disposition: A | Discharge status: Home / Self Care | Payer: Medicare HMO | Source: Ambulatory Visit | Attending: Cardiovascular Disease | Admitting: Cardiovascular Disease

## 2017-02-18 DIAGNOSIS — I25728 Atherosclerosis of autologous artery coronary artery bypass graft(s) with other forms of angina pectoris: Secondary | ICD-10-CM | POA: Insufficient documentation

## 2017-02-18 DIAGNOSIS — I712 Thoracic aortic aneurysm, without rupture, unspecified: Secondary | ICD-10-CM

## 2017-02-21 ENCOUNTER — Encounter: Payer: Self-pay | Admitting: Cardiovascular Disease

## 2017-02-21 ENCOUNTER — Ambulatory Visit (INDEPENDENT_AMBULATORY_CARE_PROVIDER_SITE_OTHER): Payer: Medicare HMO | Admitting: Cardiovascular Disease

## 2017-02-21 VITALS — BP 118/70 | HR 64 | Ht 62.0 in | Wt 114.0 lb

## 2017-02-21 DIAGNOSIS — I351 Nonrheumatic aortic (valve) insufficiency: Secondary | ICD-10-CM | POA: Diagnosis not present

## 2017-02-21 DIAGNOSIS — Z01812 Encounter for preprocedural laboratory examination: Secondary | ICD-10-CM

## 2017-02-21 DIAGNOSIS — E785 Hyperlipidemia, unspecified: Secondary | ICD-10-CM | POA: Diagnosis not present

## 2017-02-21 DIAGNOSIS — I200 Unstable angina: Secondary | ICD-10-CM | POA: Diagnosis not present

## 2017-02-21 DIAGNOSIS — I10 Essential (primary) hypertension: Secondary | ICD-10-CM | POA: Diagnosis not present

## 2017-02-21 DIAGNOSIS — I7121 Aneurysm of the ascending aorta, without rupture: Secondary | ICD-10-CM

## 2017-02-21 DIAGNOSIS — I712 Thoracic aortic aneurysm, without rupture: Secondary | ICD-10-CM

## 2017-02-21 LAB — BASIC METABOLIC PANEL
BUN / CREAT RATIO: 17 (ref 12–28)
BUN: 10 mg/dL (ref 8–27)
CHLORIDE: 104 mmol/L (ref 96–106)
CO2: 23 mmol/L (ref 20–29)
CREATININE: 0.59 mg/dL (ref 0.57–1.00)
Calcium: 9.4 mg/dL (ref 8.7–10.3)
GFR calc Af Amer: 102 mL/min/{1.73_m2} (ref >59)
GFR calc non Af Amer: 88 mL/min/{1.73_m2} (ref >59)
GLUCOSE: 88 mg/dL (ref 65–99)
POTASSIUM: 4 mmol/L (ref 3.5–5.2)
SODIUM: 141 mmol/L (ref 134–144)

## 2017-02-21 LAB — CBC
Hematocrit: 40.4 % (ref 34.0–46.6)
Hemoglobin: 13.5 g/dL (ref 11.1–15.9)
MCH: 30.7 pg (ref 26.6–33.0)
MCHC: 33.4 g/dL (ref 31.5–35.7)
MCV: 92 fL (ref 79–97)
PLATELETS: 260 10*3/uL (ref 150–379)
RBC: 4.4 x10E6/uL (ref 3.77–5.28)
RDW: 14.2 % (ref 12.3–15.4)
WBC: 8 10*3/uL (ref 3.4–10.8)

## 2017-02-21 LAB — PROTIME-INR
INR: 1 (ref 0.8–1.2)
Prothrombin Time: 10.2 s (ref 9.1–12.0)

## 2017-02-21 NOTE — Progress Notes (Signed)
 Cardiology Office Note    Date:  02/21/2017   ID:  Evelyn [name], DOB 11/27/1940, MRN [id number]  PCP:  Jones, Angel S, PA-C  Cardiologist:   Bary Limbach, MD   Chief Complaint  Patient presents with  . Follow-up    History of Present Illness:  Evelyn [name] is a 76 y.o. female with long-standing history of coronary artery disease with previous stents to LAD followed by bypass surgery (LIMA to LAD, SVG to diagonal, SVG to OM in 2004), hyperlipidemia here forFollow-up after coronary CT angiography.  The coronary CTA shows interval development of a roughly 70% distal LAD-ostial circumflex stenosis. She has known chronic occlusion of the SVG to OM by her previous invasive angiography procedure in 2013. Also known to have total occlusion of the proximal and mid LAD stents, with patent LIMA to distal LAD and SVG to D1. Non-obstructive 30% RCA stenosis.  Continues to have occasional unpredictable episodes of chest discomfort that resolve with sublingual nitroglycerin. One woke her from sleep last night at 2 AM and resolved a few minutes after taking nitroglycerin.  The symptoms began approximately 2 weeks ago, when she was walking up a slope when she developed a burning/hurting sensation in the retrosternal area as well as dyspnea. She sat down and took 2 sublingual nitroglycerin with relief of symptoms after about 15 minutes. She has also noticed increasing dyspnea on exertion on other occasions as well, sometimes just walking to her mailbox.  Had an echocardiogram in May, showing normal LVEF 55-60%, with inferior and inferoseptal mild hypokinesis, mildly dilated ascending aorta, mild aortic insufficiency. In September 2017 her treadmill nuclear stress test showed normal myocardial perfusion and EF 62%. In October 2017 she had the last evaluation of her aorta, showing mild aneurysmal dilation of the ascending aorta at 3.7 cm and of the aortic arch at 3.3 cm. (She is a very small frame  lady, 5 foot 2 inches tall, 112 pounds).  The patient specifically denies orthopnea, paroxysmal nocturnal dyspnea, syncope, palpitations, focal neurological deficits, intermittent claudication, lower extremity edema (right leg, site of saphenectomy harvest sites is always a little larger), unexplained weight gain, cough, hemoptysis or wheezing.   She has long-standing history of coronary disease, undergoing her first cardiac catheterization in 1999. She received stents to the LAD artery in 2002 and had to have cutting balloon atherectomy for in-stent restenosis in 2003 followed by a new LAD stent in 2004 for disease progression. Ultimately after again developing restenosis she underwent coronary bypass surgery (LIMA to LAD, SVG to diagonal, SVG to OM in 2004). She had cardiac catheterization in July 2013. This demonstrated interval total occlusion of the proximal LAD artery although the distant bypasses are all widely patent. On her stress test in 2013 and 2017  the perfusion images were "normal". It was felt that she may have occasional angina pectoris related to ischemia in the proximal septum since there is no retrograde flow from the bypasses to the proximal LAD. Her last echo in May 2018 showed normal EF 55-60%, but reported inferior and inferoseptal mild hypokinesis. She also has a small aneurysm of the ascending aorta 3.7 cm and arch 3.3 cm with mild aortic insufficiency. This was actually measured smaller at 3.3 cm on follow-up CT angiogram in October 2018 CT angiogram of the head and neck performed in May 2018 for TIA showed mild extracranial atherosclerosis. She has treated systemic hypertension and hyperlipidemia and hypothyroidism.  She had previous cervical spine surgery with bone grafting   and discectomy in 2009.   Past Medical History:  Diagnosis Date  . Aneurysm (HCC)   . Aortic insufficiency   . CAD (coronary artery disease)    CABG LIMA to LAD,vein graft to diagonal,vein graft to OM   . Dyslipidemia   . GERD (gastroesophageal reflux disease)   . Hypothyroid     Past Surgical History:  Procedure Laterality Date  . ABDOMINAL HYSTERECTOMY    . BACK SURGERY  2009  . CORONARY ANGIOPLASTY  2003   cutting balloon atherectomy in-stent stenosis of the LAD.  . CORONARY ANGIOPLASTY WITH STENT PLACEMENT  2002   remote PCI & stenting LAD,  . CORONARY ARTERY BYPASS GRAFT  2004   LIMA to LAD,vein graft to diagonal,vein graft to OM  . FOOT SURGERY    . LEFT HEART CATHETERIZATION WITH CORONARY ANGIOGRAM N/A 12/15/2011   Procedure: LEFT HEART CATHETERIZATION WITH CORONARY ANGIOGRAM;  Surgeon: Jearld Hemp, MD;  Location: [facility] CATH [facility];  Service: Cardiovascular;  Laterality: N/A;  . NECK SURGERY    . NM MYOCAR PERF WALL MOTION  12/01/2011   normal study-persistent exercise induced ECG changes raises the concern for ischemia (balanced ischemia).  . NOSE SURGERY    . TONSILLECTOMY  1948  . TUBAL LIGATION      Current Medications: Outpatient Medications Prior to Visit  Medication Sig Dispense Refill  . ALPRAZolam (XANAX) 0.25 MG tablet Take 1 tablet (0.25 mg total) by mouth 3 (three) times daily. 90 tablet 1  . amoxicillin (AMOXIL) 500 MG capsule Take 4 capsules by mouth once. Prior to dental appointments.  0  . aspirin EC 325 MG tablet Take 325 mg by mouth daily.    . furosemide (LASIX) 20 MG tablet Take 1 tablet (20 mg total) by mouth daily. 90 tablet 3  . HYDROcodone-acetaminophen (NORCO/VICODIN) 5-325 MG tablet Take 1 tablet by mouth every 6 (six) hours as needed for moderate pain. 120 tablet 0  . hydrocortisone 2.5 % cream Apply topically 2 (two) times daily. 30 g 0  . levothyroxine (SYNTHROID, LEVOTHROID) 112 MCG tablet Take 1 tablet (112 mcg total) by mouth daily before breakfast. 30 tablet 6  . loratadine (CLARITIN) 10 MG tablet Take 1 tablet (10 mg total) by mouth daily. 30 tablet 11  . metoprolol tartrate (LOPRESSOR) 100 MG tablet TAKE 1 TABLET TWICE A DAY 60 tablet 3  .  nitroGLYCERIN (NITROSTAT) 0.4 MG SL tablet Place 1 tablet (0.4 mg total) under the tongue every 5 (five) minutes as needed for chest pain. 25 tablet 3  . rosuvastatin (CRESTOR) 40 MG tablet Take 1 tablet (40 mg total) by mouth daily. 30 tablet 11  . Zoledronic Acid (RECLAST IV) Inject into the vein. Once yearly     No facility-administered medications prior to visit.      Allergies:   Sulfa antibiotics   Social History   Social History  . Marital status: Married    Spouse name: N/A  . Number of children: 2  . Years of education: Associates   Occupational History  . Retired    Social History Main Topics  . Smoking status: Never Smoker  . Smokeless tobacco: Never Used  . Alcohol use No  . Drug use: No  . Sexual activity: Not Asked   Other Topics Concern  . None   Social History Narrative   Mother of 2 with 4 grandchildren   Right-handed   Caffeine: 8-10 cups coffee     Family History:  The patient's family history   includes Asthma in her maternal grandmother; Heart attack in her mother; Heart failure in her father; Hypertension in her father.   ROS:   Please see the history of present illness.    ROS All other systems reviewed and are negative.   PHYSICAL EXAM:   VS:  BP 118/70   Pulse 64   Ht 5' 2" (1.575 m)   Wt 114 lb (51.7 kg)   BMI 20.85 kg/m      General: Alert, oriented x3, no distress, Very slender and appears fit and younger than stated age Head: no evidence of trauma, PERRL, EOMI, no exophtalmos or lid lag, no myxedema, no xanthelasma; normal ears, nose and oropharynx Neck: normal jugular venous pulsations and no hepatojugular reflux; brisk carotid pulses without delay and no carotid bruits Chest: clear to auscultation, no signs of consolidation by percussion or palpation, normal fremitus, symmetrical and full respiratory excursions Cardiovascular: normal position and quality of the apical impulse, regular rhythm, normal first and second heart sounds, no  murmurs, rubs or gallops Abdomen: no tenderness or distention, no masses by palpation, no abnormal pulsatility or arterial bruits, normal bowel sounds, no hepatosplenomegaly Extremities: no clubbing, cyanosis or edema; 2+ radial, ulnar and brachial pulses bilaterally; 2+ right femoral, posterior tibial and dorsalis pedis pulses; 2+ left femoral, posterior tibial and dorsalis pedis pulses; no subclavian or femoral bruits Neurological: grossly nonfocal Psych: Normal mood and affect   Wt Readings from Last 3 Encounters:  02/21/17 114 lb (51.7 kg)  02/01/17 112 lb 3.2 oz (50.9 kg)  01/25/17 113 lb (51.3 kg)      Studies/Labs Reviewed:   EKG:  EKG is not ordered today.  The ekg ordered 02/01/2017 demonstrates Sinus rhythm, chronic T-wave inversion in leads V1-V3, not changed from previous tracings. Recent Labs: 07/21/2016: ALT 13 09/01/2016: BUN 10; Hemoglobin 12.5; Platelets 238; Potassium 4.4; Sodium 136 11/18/2016: TSH 6.580 02/10/2017: Creatinine, Ser 0.62   Lipid Panel    Component Value Date/Time   CHOL 157 07/21/2016 0935   TRIG 76 07/21/2016 0935   HDL 56 07/21/2016 0935   CHOLHDL 2.8 07/21/2016 0935   LDLCALC 86 07/21/2016 0935     ASSESSMENT:    1. Unstable angina (HCC)   2. Essential hypertension   3. Dyslipidemia   4. Ascending aortic aneurysm (HCC)   5. Nonrheumatic aortic valve insufficiency   6. Pre-procedure lab exam      PLAN:  In order of problems listed above:  1. CAD s/p CABG: Coronary CT angiogram suggest progression of the native artery disease, with 70% stenosis at the distal left main-ostial circumflex, with known chronic occlusion of the SVG to circumflex. This may be the source of her new symptoms. Also known to have occlusion of the proximal and mid LAD and probably has proximal septal ischemia. Scheduled for diagnostic coronary angiography and possible angioplasty-stent with Dr. Cooper on Wednesday afternoon, 02/23/2017. This procedure has been fully  reviewed with the patient and written informed consent has been obtained. 2. HTN: Excellent control on high-dose beta blocker. If coronary angiography does not show a clear-cut stenosis that can be revascularized, consider adding amlodipine or long-acting nitrates. 3. HLP: Her LDL is not ideal, but is substantially lower than at baseline with a better than 50% reduction. Continue rosuvastatin maximum dose. 4. Asc Ao Aneurysm: Even though the absolute measurement of the aorta is not very impressive, she is a very small framed person and yearly CT angiography is reasonable, especially since she is having some symptoms.   5. AI: mild by physical exam and by echo.   Medication Adjustments/Labs and Tests Ordered: Current medicines are reviewed at length with the patient today.  Concerns regarding medicines are outlined above.  Medication changes, Labs and Tests ordered today are listed in the Patient Instructions below. Patient Instructions    [facility] [address] [place] [address] Dept: [phone] Loc: [phone]  Latresa H Kopke  02/21/2017  You are scheduled for a Cardiac Catheterization on Wednesday, October 10 with Dr. Michael Cooper.  1. Please arrive at the [facility] (Main Entrance A) at [facility]: [address] [place], [place] [address] at 12:30 PM (two hours before your procedure to ensure your preparation). Free valet parking service is available.   Special note: Every effort is made to have your procedure done on time. Please understand that emergencies sometimes delay scheduled procedures.  2. Diet: Do not eat or drink anything after midnight prior to your procedure except sips of water to take medications.  3. Labs: You will need to have blood drawn on Monday, October 8 at [facility] located within our office [address], [place]  You do not need to be  fasting.  4. Medication instructions in preparation for your procedure:  On the morning of your procedure, take Aspirin 325 mg and your morning medicines.  You may use sips of water.  5. Plan for one night stay--bring personal belongings.  6. Bring a current list of your medications and current insurance cards.  7. You MUST have a responsible person to drive you home.  8. Someone MUST be with you the first 24 hours after you arrive home or your discharge will be delayed.   9. Please wear clothes that are easy to get on and off and wear slip-on shoes.  Thank you for allowing us to care for you!   -- [facility]    Signed, Shandon Matson, MD  02/21/2017 11:19 AM    [facility] [address], [place], [place]  [address] Phone: [phone]; Fax: [phone]   

## 2017-02-21 NOTE — Patient Instructions (Addendum)
   [facility] CARDIOVASCULAR DIVISION [facility] [address] [place] [address] Dept: [phone] Loc: [phone]  Evelyn [name]  02/21/2017  You are scheduled for a Cardiac Catheterization on Wednesday, October 10 with Dr. Tonny Bollman.  1. Please arrive at the [facility] (Main Entrance A) at [facility]: [address] [place], [place] [address] at 12:30 PM (two hours before your procedure to ensure your preparation). Free valet parking service is available.   Special note: Every effort is made to have your procedure done on time. Please understand that emergencies sometimes delay scheduled procedures.  2. Diet: Do not eat or drink anything after midnight prior to your procedure except sips of water to take medications.  3. Labs: You will need to have blood drawn on Monday, October 8 at [facility] located within our office [address], [place]  You do not need to be fasting.  4. Medication instructions in preparation for your procedure:  On the morning of your procedure, take Aspirin 325 mg and your morning medicines.  You may use sips of water.  5. Plan for one night stay--bring personal belongings.  6. Bring a current list of your medications and current insurance cards.  7. You MUST have a responsible person to drive you home.  8. Someone MUST be with you the first 24 hours after you arrive home or your discharge will be delayed.   9. Please wear clothes that are easy to get on and off and wear slip-on shoes.  Thank you for allowing [place] to care for you!   -- [facility]

## 2017-02-22 ENCOUNTER — Telehealth: Payer: Self-pay

## 2017-02-22 ENCOUNTER — Other Ambulatory Visit: Payer: Self-pay | Admitting: Cardiovascular Disease

## 2017-02-22 NOTE — Telephone Encounter (Signed)
Patient contacted pre-catheterization at [facility] scheduled for:  02/23/2017 @ 1430 Verified arrival time and place:  [facility] @ 1230 Confirmed AM meds to be taken pre-cath with sip of water: Take ASA Hold lasix Confirmed patient has responsible person to drive home post procedure and observe patient for 24 hours:  yes Addl concerns:  none

## 2017-02-23 ENCOUNTER — Ambulatory Visit (HOSPITAL_COMMUNITY)
Admission: RE | Disposition: A | Discharge status: Home / Self Care | Payer: Self-pay | Source: Ambulatory Visit | Attending: Cardiovascular Disease

## 2017-02-23 ENCOUNTER — Ambulatory Visit (HOSPITAL_COMMUNITY)
Admission: RE | Admit: 2017-02-23 | Discharge: 2017-02-23 | Disposition: A | Discharge status: Home / Self Care | Payer: Medicare HMO | Source: Ambulatory Visit | Attending: Cardiovascular Disease | Admitting: Cardiovascular Disease

## 2017-02-23 DIAGNOSIS — I25709 Atherosclerosis of coronary artery bypass graft(s), unspecified, with unspecified angina pectoris: Secondary | ICD-10-CM

## 2017-02-23 DIAGNOSIS — Z8673 Personal history of transient ischemic attack (TIA), and cerebral infarction without residual deficits: Secondary | ICD-10-CM | POA: Insufficient documentation

## 2017-02-23 DIAGNOSIS — Z79899 Other long term (current) drug therapy: Secondary | ICD-10-CM | POA: Insufficient documentation

## 2017-02-23 DIAGNOSIS — Z8249 Family history of ischemic heart disease and other diseases of the circulatory system: Secondary | ICD-10-CM | POA: Insufficient documentation

## 2017-02-23 DIAGNOSIS — Z90710 Acquired absence of both cervix and uterus: Secondary | ICD-10-CM | POA: Diagnosis not present

## 2017-02-23 DIAGNOSIS — Z7982 Long term (current) use of aspirin: Secondary | ICD-10-CM | POA: Insufficient documentation

## 2017-02-23 DIAGNOSIS — E785 Hyperlipidemia, unspecified: Secondary | ICD-10-CM | POA: Diagnosis not present

## 2017-02-23 DIAGNOSIS — I351 Nonrheumatic aortic (valve) insufficiency: Secondary | ICD-10-CM | POA: Insufficient documentation

## 2017-02-23 DIAGNOSIS — I25700 Atherosclerosis of coronary artery bypass graft(s), unspecified, with unstable angina pectoris: Secondary | ICD-10-CM | POA: Insufficient documentation

## 2017-02-23 DIAGNOSIS — K219 Gastro-esophageal reflux disease without esophagitis: Secondary | ICD-10-CM | POA: Insufficient documentation

## 2017-02-23 DIAGNOSIS — E039 Hypothyroidism, unspecified: Secondary | ICD-10-CM | POA: Insufficient documentation

## 2017-02-23 DIAGNOSIS — I25110 Atherosclerotic heart disease of native coronary artery with unstable angina pectoris: Secondary | ICD-10-CM | POA: Diagnosis not present

## 2017-02-23 DIAGNOSIS — I712 Thoracic aortic aneurysm, without rupture: Secondary | ICD-10-CM | POA: Diagnosis not present

## 2017-02-23 DIAGNOSIS — I10 Essential (primary) hypertension: Secondary | ICD-10-CM | POA: Insufficient documentation

## 2017-02-23 DIAGNOSIS — Z882 Allergy status to sulfonamides status: Secondary | ICD-10-CM | POA: Diagnosis not present

## 2017-02-23 DIAGNOSIS — Z955 Presence of coronary angioplasty implant and graft: Secondary | ICD-10-CM | POA: Diagnosis not present

## 2017-02-23 DIAGNOSIS — Z825 Family history of asthma and other chronic lower respiratory diseases: Secondary | ICD-10-CM | POA: Insufficient documentation

## 2017-02-23 HISTORY — PX: LEFT HEART CATH AND CORS/GRAFTS ANGIOGRAPHY: CATH118250

## 2017-02-23 SURGERY — LEFT HEART CATH AND CORS/GRAFTS ANGIOGRAPHY
Anesthesia: LOCAL

## 2017-02-23 MED ORDER — SODIUM CHLORIDE 0.9 % IV SOLN: 250.0000 mL | INTRAVENOUS | Status: DC | PRN

## 2017-02-23 MED ORDER — FENTANYL CITRATE (PF) 100 MCG/2ML IJ SOLN
INTRAMUSCULAR | Status: AC
  Filled 2017-02-23: qty 2

## 2017-02-23 MED ORDER — ASPIRIN 81 MG PO CHEW: 324.0000 mg | CHEWABLE_TABLET | ORAL | Status: DC

## 2017-02-23 MED ORDER — IOPAMIDOL (ISOVUE-370) INJECTION 76%
INTRAVENOUS | Status: AC
  Filled 2017-02-23: qty 100

## 2017-02-23 MED ORDER — HEPARIN SODIUM (PORCINE) 1000 UNIT/ML IJ SOLN
INTRAMUSCULAR | Status: AC
  Filled 2017-02-23: qty 1

## 2017-02-23 MED ORDER — SODIUM CHLORIDE 0.9 % WEIGHT BASED INFUSION: 1.0000 mL/kg/h | INTRAVENOUS | Status: DC

## 2017-02-23 MED ORDER — LIDOCAINE HCL 2 % IJ SOLN
INTRAMUSCULAR | Status: AC
  Filled 2017-02-23: qty 10

## 2017-02-23 MED ORDER — HEPARIN SODIUM (PORCINE) 1000 UNIT/ML IJ SOLN
INTRAMUSCULAR | Status: DC | PRN
  Administered 2017-02-23: 3000 [IU] via INTRAVENOUS

## 2017-02-23 MED ORDER — FENTANYL CITRATE (PF) 100 MCG/2ML IJ SOLN
INTRAMUSCULAR | Status: DC | PRN
  Administered 2017-02-23: 50 ug via INTRAVENOUS

## 2017-02-23 MED ORDER — SODIUM CHLORIDE 0.9% FLUSH: 3.0000 mL | INTRAVENOUS | Status: DC | PRN

## 2017-02-23 MED ORDER — IOPAMIDOL (ISOVUE-370) INJECTION 76%
INTRAVENOUS | Status: DC | PRN
  Administered 2017-02-23: 35 mL via INTRA_ARTERIAL

## 2017-02-23 MED ORDER — HEPARIN (PORCINE) IN NACL 2-0.9 UNIT/ML-% IJ SOLN
INTRAMUSCULAR | Status: AC | PRN
  Administered 2017-02-23: 1000 mL

## 2017-02-23 MED ORDER — MIDAZOLAM HCL 2 MG/2ML IJ SOLN
INTRAMUSCULAR | Status: AC
  Filled 2017-02-23: qty 2

## 2017-02-23 MED ORDER — SODIUM CHLORIDE 0.9% FLUSH: 3.0000 mL | Freq: Two times a day (BID) | INTRAVENOUS | Status: DC

## 2017-02-23 MED ORDER — LIDOCAINE HCL (PF) 1 % IJ SOLN
INTRAMUSCULAR | Status: DC | PRN
  Administered 2017-02-23: 2 mL

## 2017-02-23 MED ORDER — SODIUM CHLORIDE 0.9 % IV SOLN: INTRAVENOUS | Status: DC

## 2017-02-23 MED ORDER — SODIUM CHLORIDE 0.9 % WEIGHT BASED INFUSION
3.0000 mL/kg/h | INTRAVENOUS | Status: AC
  Administered 2017-02-23: 3 mL/kg/h via INTRAVENOUS

## 2017-02-23 MED ORDER — ISOSORBIDE MONONITRATE ER 30 MG PO TB24: 30.0000 mg | ORAL_TABLET | Freq: Every day | ORAL | 6 refills | Status: DC

## 2017-02-23 MED ORDER — MIDAZOLAM HCL 2 MG/2ML IJ SOLN
INTRAMUSCULAR | Status: DC | PRN
  Administered 2017-02-23: 1 mg via INTRAVENOUS

## 2017-02-23 MED ORDER — HEPARIN (PORCINE) IN NACL 2-0.9 UNIT/ML-% IJ SOLN
INTRAMUSCULAR | Status: AC
  Filled 2017-02-23: qty 1000

## 2017-02-23 MED ORDER — VERAPAMIL HCL 2.5 MG/ML IV SOLN
INTRAVENOUS | Status: DC | PRN
  Administered 2017-02-23: 10 mL via INTRA_ARTERIAL

## 2017-02-23 MED ORDER — VERAPAMIL HCL 2.5 MG/ML IV SOLN
INTRAVENOUS | Status: AC
  Filled 2017-02-23: qty 2

## 2017-02-23 SURGICAL SUPPLY — 10 items
CATH OPTITORQUE TIG 4.0 5F (CATHETERS) ×1 IMPLANT
DEVICE RAD COMP TR BAND LRG (VASCULAR PRODUCTS) ×1 IMPLANT
GLIDESHEATH SLEND SS 6F .021 (SHEATH) ×1 IMPLANT
GUIDEWIRE INQWIRE 1.5J.035X260 (WIRE) IMPLANT
INQWIRE 1.5J .035X260CM (WIRE) ×2
KIT HEART LEFT (KITS) ×2 IMPLANT
PACK CARDIAC CATHETERIZATION (CUSTOM PROCEDURE TRAY) ×2 IMPLANT
SYR MEDRAD MARK V 150ML (SYRINGE) ×2 IMPLANT
TRANSDUCER W/STOPCOCK (MISCELLANEOUS) ×2 IMPLANT
TUBING CIL FLEX 10 FLL-RA (TUBING) ×2 IMPLANT

## 2017-02-23 NOTE — H&P (View-Only) (Signed)
Cardiology Office Note    Date:  02/21/2017   ID:  Evelyn [name], DOB 06/29/1940, MRN [id number]  PCP:  Remus Loffler, PA-C  Cardiologist:   Thurmon Fair, MD   Chief Complaint  Patient presents with  . Follow-up    History of Present Illness:  Evelyn [name] is a 76 y.o. female with long-standing history of coronary artery disease with previous stents to LAD followed by bypass surgery (LIMA to LAD, SVG to diagonal, SVG to OM in 2004), hyperlipidemia here forFollow-up after coronary CT angiography.  The coronary CTA shows interval development of a roughly 70% distal LAD-ostial circumflex stenosis. She has known chronic occlusion of the SVG to OM by her previous invasive angiography procedure in 2013. Also known to have total occlusion of the proximal and mid LAD stents, with patent LIMA to distal LAD and SVG to D1. Non-obstructive 30% RCA stenosis.  Continues to have occasional unpredictable episodes of chest discomfort that resolve with sublingual nitroglycerin. One woke her from sleep last night at 2 AM and resolved a few minutes after taking nitroglycerin.  The symptoms began approximately 2 weeks ago, when she was walking up a slope when she developed a burning/hurting sensation in the retrosternal area as well as dyspnea. She sat down and took 2 sublingual nitroglycerin with relief of symptoms after about 15 minutes. She has also noticed increasing dyspnea on exertion on other occasions as well, sometimes just walking to her mailbox.  Had an echocardiogram in May, showing normal LVEF 55-60%, with inferior and inferoseptal mild hypokinesis, mildly dilated ascending aorta, mild aortic insufficiency. In September 2017 her treadmill nuclear stress test showed normal myocardial perfusion and EF 62%. In October 2017 she had the last evaluation of her aorta, showing mild aneurysmal dilation of the ascending aorta at 3.7 cm and of the aortic arch at 3.3 cm. (She is a very small frame  lady, 5 foot 2 inches tall, 112 pounds).  The patient specifically denies orthopnea, paroxysmal nocturnal dyspnea, syncope, palpitations, focal neurological deficits, intermittent claudication, lower extremity edema (right leg, site of saphenectomy harvest sites is always a little larger), unexplained weight gain, cough, hemoptysis or wheezing.   She has long-standing history of coronary disease, undergoing her first cardiac catheterization in 1999. She received stents to the LAD artery in 2002 and had to have cutting balloon atherectomy for in-stent restenosis in 2003 followed by a new LAD stent in 2004 for disease progression. Ultimately after again developing restenosis she underwent coronary bypass surgery (LIMA to LAD, SVG to diagonal, SVG to OM in 2004). She had cardiac catheterization in July 2013. This demonstrated interval total occlusion of the proximal LAD artery although the distant bypasses are all widely patent. On her stress test in 2013 and 2017  the perfusion images were "normal". It was felt that she may have occasional angina pectoris related to ischemia in the proximal septum since there is no retrograde flow from the bypasses to the proximal LAD. Her last echo in May 2018 showed normal EF 55-60%, but reported inferior and inferoseptal mild hypokinesis. She also has a small aneurysm of the ascending aorta 3.7 cm and arch 3.3 cm with mild aortic insufficiency. This was actually measured smaller at 3.3 cm on follow-up CT angiogram in October 2018 CT angiogram of the head and neck performed in May 2018 for TIA showed mild extracranial atherosclerosis. She has treated systemic hypertension and hyperlipidemia and hypothyroidism.  She had previous cervical spine surgery with bone grafting  and discectomy in 2009.   Past Medical History:  Diagnosis Date  . Aneurysm (HCC)   . Aortic insufficiency   . CAD (coronary artery disease)    CABG LIMA to LAD,vein graft to diagonal,vein graft to OM   . Dyslipidemia   . GERD (gastroesophageal reflux disease)   . Hypothyroid     Past Surgical History:  Procedure Laterality Date  . ABDOMINAL HYSTERECTOMY    . BACK SURGERY  2009  . CORONARY ANGIOPLASTY  2003   cutting balloon atherectomy in-stent stenosis of the LAD.  [name] CORONARY ANGIOPLASTY WITH STENT PLACEMENT  2002   remote PCI & stenting LAD,  . CORONARY ARTERY BYPASS GRAFT  2004   LIMA to LAD,vein graft to diagonal,vein graft to OM  . FOOT SURGERY    . LEFT HEART CATHETERIZATION WITH CORONARY ANGIOGRAM N/A 12/15/2011   Procedure: LEFT HEART CATHETERIZATION WITH CORONARY ANGIOGRAM;  Surgeon: Thurmon Fair, MD;  Location: [facility];  Service: Cardiovascular;  Laterality: N/A;  . NECK SURGERY    . NM MYOCAR PERF WALL MOTION  12/01/2011   normal study-persistent exercise induced ECG changes raises the concern for ischemia (balanced ischemia).  . NOSE SURGERY    . TONSILLECTOMY  1948  . TUBAL LIGATION      Current Medications: Outpatient Medications Prior to Visit  Medication Sig Dispense Refill  . ALPRAZolam (XANAX) 0.25 MG tablet Take 1 tablet (0.25 mg total) by mouth 3 (three) times daily. 90 tablet 1  . amoxicillin (AMOXIL) 500 MG capsule Take 4 capsules by mouth once. Prior to dental appointments.  0  . aspirin EC 325 MG tablet Take 325 mg by mouth daily.    . furosemide (LASIX) 20 MG tablet Take 1 tablet (20 mg total) by mouth daily. 90 tablet 3  . HYDROcodone-acetaminophen (NORCO/VICODIN) 5-325 MG tablet Take 1 tablet by mouth every 6 (six) hours as needed for moderate pain. 120 tablet 0  . hydrocortisone 2.5 % cream Apply topically 2 (two) times daily. 30 g 0  . levothyroxine (SYNTHROID, LEVOTHROID) 112 MCG tablet Take 1 tablet (112 mcg total) by mouth daily before breakfast. 30 tablet 6  . loratadine (CLARITIN) 10 MG tablet Take 1 tablet (10 mg total) by mouth daily. 30 tablet 11  . metoprolol tartrate (LOPRESSOR) 100 MG tablet TAKE 1 TABLET TWICE A DAY 60 tablet 3  .  nitroGLYCERIN (NITROSTAT) 0.4 MG SL tablet Place 1 tablet (0.4 mg total) under the tongue every 5 (five) minutes as needed for chest pain. 25 tablet 3  . rosuvastatin (CRESTOR) 40 MG tablet Take 1 tablet (40 mg total) by mouth daily. 30 tablet 11  . Zoledronic Acid (RECLAST IV) Inject into the vein. Once yearly     No facility-administered medications prior to visit.      Allergies:   Sulfa antibiotics   Social History   Social History  . Marital status: Married    Spouse name: N/A  . Number of children: 2  . Years of education: Associates   Occupational History  . Retired    Social History Main Topics  . Smoking status: Never Smoker  . Smokeless tobacco: Never Used  . Alcohol use No  . Drug use: No  . Sexual activity: Not Asked   Other Topics Concern  . None   Social History Narrative   Mother of 2 with 4 grandchildren   Right-handed   Caffeine: 8-10 cups coffee     Family History:  The patient's family history  includes Asthma in her maternal grandmother; Heart attack in her mother; Heart failure in her father; Hypertension in her father.   ROS:   Please see the history of present illness.    ROS All other systems reviewed and are negative.   PHYSICAL EXAM:   VS:  BP 118/70   Pulse 64   Ht  (1.575 m)   Wt 114 lb (51.7 kg)   BMI 20.85 kg/m      General: Alert, oriented x3, no distress, Very slender and appears fit and younger than stated age Head: no evidence of trauma, PERRL, EOMI, no exophtalmos or lid lag, no myxedema, no xanthelasma; normal ears, nose and oropharynx Neck: normal jugular venous pulsations and no hepatojugular reflux; brisk carotid pulses without delay and no carotid bruits Chest: clear to auscultation, no signs of consolidation by percussion or palpation, normal fremitus, symmetrical and full respiratory excursions Cardiovascular: normal position and quality of the apical impulse, regular rhythm, normal first and second heart sounds, no  murmurs, rubs or gallops Abdomen: no tenderness or distention, no masses by palpation, no abnormal pulsatility or arterial bruits, normal bowel sounds, no hepatosplenomegaly Extremities: no clubbing, cyanosis or edema; 2+ radial, ulnar and brachial pulses bilaterally; 2+ right femoral, posterior tibial and dorsalis pedis pulses; 2+ left femoral, posterior tibial and dorsalis pedis pulses; no subclavian or femoral bruits Neurological: grossly nonfocal Psych: Normal mood and affect   Wt Readings from Last 3 Encounters:  02/21/17 114 lb (51.7 kg)  02/01/17 112 lb 3.2 oz (50.9 kg)  01/25/17 113 lb (51.3 kg)      Studies/Labs Reviewed:   EKG:  EKG is not ordered today.  The ekg ordered 02/01/2017 demonstrates Sinus rhythm, chronic T-wave inversion in leads V1-V3, not changed from previous tracings. Recent Labs: 07/21/2016: ALT 13 09/01/2016: BUN 10; Hemoglobin 12.5; Platelets 238; Potassium 4.4; Sodium 136 11/18/2016: TSH 6.580 02/10/2017: Creatinine, Ser 0.62   Lipid Panel    Component Value Date/Time   CHOL 157 07/21/2016 0935   TRIG 76 07/21/2016 0935   HDL 56 07/21/2016 0935   CHOLHDL 2.8 07/21/2016 0935   LDLCALC 86 07/21/2016 0935     ASSESSMENT:    1. Unstable angina (HCC)   2. Essential hypertension   3. Dyslipidemia   4. Ascending aortic aneurysm (HCC)   5. Nonrheumatic aortic valve insufficiency   6. Pre-procedure lab exam      PLAN:  In order of problems listed above:  1. CAD s/p CABG: Coronary CT angiogram suggest progression of the native artery disease, with 70% stenosis at the distal left main-ostial circumflex, with known chronic occlusion of the SVG to circumflex. This may be the source of her new symptoms. Also known to have occlusion of the proximal and mid LAD and probably has proximal septal ischemia. Scheduled for diagnostic coronary angiography and possible angioplasty-stent with Dr. Excell Seltzer on Wednesday afternoon, 02/23/2017. This procedure has been fully  reviewed with the patient and written informed consent has been obtained. 2. HTN: Excellent control on high-dose beta blocker. If coronary angiography does not show a clear-cut stenosis that can be revascularized, consider adding amlodipine or long-acting nitrates. 3. HLP: Her LDL is not ideal, but is substantially lower than at baseline with a better than 50% reduction. Continue rosuvastatin maximum dose. 4. Asc Ao Aneurysm: Even though the absolute measurement of the aorta is not very impressive, she is a very small framed person and yearly CT angiography is reasonable, especially since she is having some symptoms.  5. AI: mild by physical exam and by echo.   Medication Adjustments/Labs and Tests Ordered: Current medicines are reviewed at length with the patient today.  Concerns regarding medicines are outlined above.  Medication changes, Labs and Tests ordered today are listed in the Patient Instructions below. Patient Instructions    [facility] CARDIOVASCULAR DIVISION [facility] [address] [place] [address] Dept: [phone] Loc: [phone]  Evelyn [name]  02/21/2017  You are scheduled for a Cardiac Catheterization on Wednesday, October 10 with Dr. Tonny Bollman.  1. Please arrive at the [facility] (Main Entrance A) at [facility]: [address] [place], [place] [address] at 12:30 PM (two hours before your procedure to ensure your preparation). Free valet parking service is available.   Special note: Every effort is made to have your procedure done on time. Please understand that emergencies sometimes delay scheduled procedures.  2. Diet: Do not eat or drink anything after midnight prior to your procedure except sips of water to take medications.  3. Labs: You will need to have blood drawn on Monday, October 8 at [place] located within our office [address], [place]  You do not need to be  fasting.  4. Medication instructions in preparation for your procedure:  On the morning of your procedure, take Aspirin 325 mg and your morning medicines.  You may use sips of water.  5. Plan for one night stay--bring personal belongings.  6. Bring a current list of your medications and current insurance cards.  7. You MUST have a responsible person to drive you home.  8. Someone MUST be with you the first 24 hours after you arrive home or your discharge will be delayed.   9. Please wear clothes that are easy to get on and off and wear slip-on shoes.  Thank you for allowing [place] to care for you!   -- [facility] Invasive Cardiovascular services    Signed, Thurmon Fair, MD  02/21/2017 11:19 AM    [facility] [address] [place], [place], [place]  [address] Phone: [phone]; Fax: [phone]

## 2017-02-23 NOTE — Discharge Instructions (Signed)

## 2017-02-23 NOTE — Interval H&P Note (Signed)
Cath Lab Visit (complete for each Cath Lab visit)  Clinical Evaluation Leading to the Procedure:   ACS: No.  Non-ACS:    Anginal Classification: CCS III  Anti-ischemic medical therapy: Minimal Therapy (1 class of medications)  Non-Invasive Test Results: No non-invasive testing performed  Prior CABG: Previous CABG      History and Physical Interval Note:  02/23/2017 3:53 PM  Evelyn [name]  has presented today for surgery, with the diagnosis of CAD  The various methods of treatment have been discussed with the patient and family. After consideration of risks, benefits and other options for treatment, the patient has consented to  Procedure(s): LEFT HEART CATH AND CORS/GRAFTS ANGIOGRAPHY (N/A) as a surgical intervention .  The patient's history has been reviewed, patient examined, no change in status, stable for surgery.  I have reviewed the patient's chart and labs.  Questions were answered to the patient's satisfaction.     Lorine Bears

## 2017-02-24 ENCOUNTER — Encounter (HOSPITAL_COMMUNITY): Payer: Self-pay | Admitting: Cardiovascular Disease

## 2017-02-24 MED FILL — Lidocaine HCl Local Inj 2%: INTRAMUSCULAR | Qty: 10 | Status: AC

## 2017-03-02 ENCOUNTER — Encounter: Payer: Self-pay | Admitting: Physician Assistant

## 2017-03-02 ENCOUNTER — Ambulatory Visit (INDEPENDENT_AMBULATORY_CARE_PROVIDER_SITE_OTHER): Payer: Medicare HMO | Admitting: Physician Assistant

## 2017-03-02 VITALS — BP 108/63 | HR 64 | Temp 96.8°F | Ht 62.0 in | Wt 114.6 lb

## 2017-03-02 DIAGNOSIS — Z23 Encounter for immunization: Secondary | ICD-10-CM | POA: Diagnosis not present

## 2017-03-02 DIAGNOSIS — I10 Essential (primary) hypertension: Secondary | ICD-10-CM | POA: Diagnosis not present

## 2017-03-02 DIAGNOSIS — S22080D Wedge compression fracture of T11-T12 vertebra, subsequent encounter for fracture with routine healing: Secondary | ICD-10-CM | POA: Diagnosis not present

## 2017-03-02 DIAGNOSIS — I7121 Aneurysm of the ascending aorta, without rupture: Secondary | ICD-10-CM

## 2017-03-02 DIAGNOSIS — E785 Hyperlipidemia, unspecified: Secondary | ICD-10-CM

## 2017-03-02 DIAGNOSIS — I712 Thoracic aortic aneurysm, without rupture: Secondary | ICD-10-CM

## 2017-03-02 MED ORDER — HYDROCODONE-ACETAMINOPHEN 5-325 MG PO TABS: 1.0000 | ORAL_TABLET | Freq: Four times a day (QID) | ORAL | 0 refills | Status: DC | PRN

## 2017-03-02 NOTE — Progress Notes (Signed)
BP 108/63   Pulse 64   Temp (!) 96.8 F (36 C) (Oral)   Ht 5\' 2"  (1.575 m)   Wt 114 lb 9.6 oz (52 kg)   BMI 20.96 kg/m    Subjective:    Patient ID: Evelyn [name], female    DOB: May 11, 1941, 76 y.o.   MRN: [id number]  HPI: Evelyn [name] is a 76 y.o. female presenting on 03/02/2017 for Follow-up and Medication Refill  This patient comes in for periodic recheck on medications and conditions including Back pain secondary to fracture. She is doing very well with her medication. She takes it appropriately and sometimes is able to take a little bit less. Next  She has had some cardiac issues in recent weeks. She had a stent placed last week and is doing very well. She will follow-up with her cardiologist as always. She is looking forward to traveling again.   All medications are reviewed today. There are no reports of any problems with the medications. All of the medical conditions are reviewed and updated.  Lab work is reviewed and will be ordered as medically necessary. There are no new problems reported with today's visit.   Relevant past medical, surgical, family and social history reviewed and updated as indicated. Allergies and medications reviewed and updated.  Past Medical History:  Diagnosis Date  . Aneurysm (HCC)   . Aortic insufficiency   . CAD (coronary artery disease)    CABG LIMA to LAD,vein graft to diagonal,vein graft to OM  . Dyslipidemia   . GERD (gastroesophageal reflux disease)   . Hypothyroid     Past Surgical History:  Procedure Laterality Date  . ABDOMINAL HYSTERECTOMY    . BACK SURGERY  2009  . CORONARY ANGIOPLASTY  2003   cutting balloon atherectomy in-stent stenosis of the LAD.  [name] CORONARY ANGIOPLASTY WITH STENT PLACEMENT  2002   remote PCI & stenting LAD,  . CORONARY ARTERY BYPASS GRAFT  2004   LIMA to LAD,vein graft to diagonal,vein graft to OM  . FOOT SURGERY    . LEFT HEART CATH AND CORS/GRAFTS ANGIOGRAPHY N/A 02/23/2017   Procedure:  LEFT HEART CATH AND CORS/GRAFTS ANGIOGRAPHY;  Surgeon: Iran Ouch, MD;  Location: [facility] INVASIVE CV LAB;  Service: Cardiovascular;  Laterality: N/A;  . LEFT HEART CATHETERIZATION WITH CORONARY ANGIOGRAM N/A 12/15/2011   Procedure: LEFT HEART CATHETERIZATION WITH CORONARY ANGIOGRAM;  Surgeon: Thurmon Fair, MD;  Location: [facility] CATH LAB;  Service: Cardiovascular;  Laterality: N/A;  . NECK SURGERY    . NM MYOCAR PERF WALL MOTION  12/01/2011   normal study-persistent exercise induced ECG changes raises the concern for ischemia (balanced ischemia).  . NOSE SURGERY    . TONSILLECTOMY  1948  . TUBAL LIGATION      Review of Systems  Constitutional: Negative.  Negative for activity change, fatigue and fever.  HENT: Negative.   Eyes: Negative.   Respiratory: Negative.  Negative for cough.   Cardiovascular: Negative.  Negative for chest pain.  Gastrointestinal: Negative.  Negative for abdominal pain.  Endocrine: Negative.   Genitourinary: Negative.  Negative for dysuria.  Musculoskeletal: Positive for arthralgias, back pain and neck pain.  Skin: Negative.   Neurological: Negative.     Allergies as of 03/02/2017      Reactions   Sulfa Antibiotics Swelling      Medication List       Accurate as of 03/02/17 10:02 AM. Always use your most recent med list.  ALPRAZolam 0.25 MG tablet Commonly known as:  XANAX Take 1 tablet (0.25 mg total) by mouth 3 (three) times daily.   aspirin EC 325 MG tablet Take 325 mg by mouth daily.   furosemide 20 MG tablet Commonly known as:  LASIX TAKE 1 TABLET (20 MG TOTAL) BY MOUTH DAILY.   HYDROcodone-acetaminophen 5-325 MG tablet Commonly known as:  NORCO/VICODIN Take 1 tablet by mouth every 6 (six) hours as needed for moderate pain.   HYDROcodone-acetaminophen 5-325 MG tablet Commonly known as:  NORCO/VICODIN Take 1 tablet by mouth every 6 (six) hours as needed for moderate pain.   HYDROcodone-acetaminophen 5-325 MG tablet Commonly  known as:  NORCO/VICODIN Take 1 tablet by mouth every 6 (six) hours as needed for moderate pain. Fill 60 days from original script date   hydrocortisone 2.5 % cream Apply topically 2 (two) times daily.   isosorbide mononitrate 30 MG 24 hr tablet Commonly known as:  IMDUR Take 1 tablet (30 mg total) by mouth daily.   levothyroxine 112 MCG tablet Commonly known as:  SYNTHROID, LEVOTHROID Take 1 tablet (112 mcg total) by mouth daily before breakfast.   loratadine 10 MG tablet Commonly known as:  CLARITIN Take 1 tablet (10 mg total) by mouth daily.   metoprolol tartrate 100 MG tablet Commonly known as:  LOPRESSOR TAKE 1 TABLET TWICE A DAY   nitroGLYCERIN 0.4 MG SL tablet Commonly known as:  NITROSTAT Place 1 tablet (0.4 mg total) under the tongue every 5 (five) minutes as needed for chest pain.   RECLAST IV Inject into the vein. Once yearly   rosuvastatin 40 MG tablet Commonly known as:  CRESTOR Take 1 tablet (40 mg total) by mouth daily.          Objective:    BP 108/63   Pulse 64   Temp (!) 96.8 F (36 C) (Oral)   Ht 5\' 2"  (1.575 m)   Wt 114 lb 9.6 oz (52 kg)   BMI 20.96 kg/m   Allergies  Allergen Reactions  . Sulfa Antibiotics Swelling    Physical Exam  Constitutional: She is oriented to person, place, and time. She appears well-developed and well-nourished.  HENT:  Head: Normocephalic and atraumatic.  Eyes: Pupils are equal, round, and reactive to light. Conjunctivae and EOM are normal.  Cardiovascular: Normal rate, regular rhythm, normal heart sounds and intact distal pulses.   Pulmonary/Chest: Effort normal and breath sounds normal.  Abdominal: Soft. Bowel sounds are normal.  Neurological: She is alert and oriented to person, place, and time. She has normal reflexes.  Skin: Skin is warm and dry. No rash noted.  Psychiatric: She has a normal mood and affect. Her behavior is normal. Judgment and thought content normal.  Nursing note and vitals  reviewed.   Results for orders placed or performed in visit on 02/21/17  Basic metabolic panel  Result Value Ref Range   Glucose 88 65 - 99 mg/dL   BUN 10 8 - 27 mg/dL   Creatinine, Ser 1.61 0.57 - 1.00 mg/dL   GFR calc non Af Amer 88 >59 mL/min/1.73   GFR calc Af Amer 102 >59 mL/min/1.73   BUN/Creatinine Ratio 17 12 - 28   Sodium 141 134 - 144 mmol/L   Potassium 4.0 3.5 - 5.2 mmol/L   Chloride 104 96 - 106 mmol/L   CO2 23 20 - 29 mmol/L   Calcium 9.4 8.7 - 10.3 mg/dL  CBC  Result Value Ref Range   WBC 8.0 3.4 -  10.8 x10E3/uL   RBC 4.40 3.77 - 5.28 x10E6/uL   Hemoglobin 13.5 11.1 - 15.9 g/dL   Hematocrit 16.140.4 09.634.0 - 46.6 %   MCV 92 79 - 97 fL   MCH 30.7 26.6 - 33.0 pg   MCHC 33.4 31.5 - 35.7 g/dL   RDW 04.514.2 40.912.3 - 81.115.4 %   Platelets 260 150 - 379 x10E3/uL  Protime-INR  Result Value Ref Range   INR 1.0 0.8 - 1.2   Prothrombin Time 10.2 9.1 - 12.0 sec      Assessment & Plan:   1. Encounter for immunization - Flu vaccine HIGH DOSE PF  2. Ascending aortic aneurysm (HCC)  3. Essential hypertension  4. Dyslipidemia  5. Closed wedge compression fracture of eleventh thoracic vertebra with routine healing, subsequent encounter - HYDROcodone-acetaminophen (NORCO/VICODIN) 5-325 MG tablet; Take 1 tablet by mouth every 6 (six) hours as needed for moderate pain.  Dispense: 120 tablet; Refill: 0    Current Outpatient Prescriptions:  .  ALPRAZolam (XANAX) 0.25 MG tablet, Take 1 tablet (0.25 mg total) by mouth 3 (three) times daily., Disp: 90 tablet, Rfl: 1 .  aspirin EC 325 MG tablet, Take 325 mg by mouth daily., Disp: , Rfl:  .  furosemide (LASIX) 20 MG tablet, TAKE 1 TABLET (20 MG TOTAL) BY MOUTH DAILY., Disp: 90 tablet, Rfl: 1 .  HYDROcodone-acetaminophen (NORCO/VICODIN) 5-325 MG tablet, Take 1 tablet by mouth every 6 (six) hours as needed for moderate pain., Disp: 120 tablet, Rfl: 0 .  hydrocortisone 2.5 % cream, Apply topically 2 (two) times daily., Disp: 30 g, Rfl: 0 .   isosorbide mononitrate (IMDUR) 30 MG 24 hr tablet, Take 1 tablet (30 mg total) by mouth daily., Disp: 30 tablet, Rfl: 6 .  levothyroxine (SYNTHROID, LEVOTHROID) 112 MCG tablet, Take 1 tablet (112 mcg total) by mouth daily before breakfast., Disp: 30 tablet, Rfl: 6 .  loratadine (CLARITIN) 10 MG tablet, Take 1 tablet (10 mg total) by mouth daily., Disp: 30 tablet, Rfl: 11 .  metoprolol tartrate (LOPRESSOR) 100 MG tablet, TAKE 1 TABLET TWICE A DAY (Patient taking differently: TAKE 100 MG BY MOUTH TWICE DAILY), Disp: 60 tablet, Rfl: 3 .  nitroGLYCERIN (NITROSTAT) 0.4 MG SL tablet, Place 1 tablet (0.4 mg total) under the tongue every 5 (five) minutes as needed for chest pain., Disp: 25 tablet, Rfl: 3 .  rosuvastatin (CRESTOR) 40 MG tablet, Take 1 tablet (40 mg total) by mouth daily., Disp: 30 tablet, Rfl: 11 .  Zoledronic Acid (RECLAST IV), Inject into the vein. Once yearly, Disp: , Rfl:  .  HYDROcodone-acetaminophen (NORCO/VICODIN) 5-325 MG tablet, Take 1 tablet by mouth every 6 (six) hours as needed for moderate pain., Disp: 120 tablet, Rfl: 0 .  HYDROcodone-acetaminophen (NORCO/VICODIN) 5-325 MG tablet, Take 1 tablet by mouth every 6 (six) hours as needed for moderate pain. Fill 60 days from original script date, Disp: 120 tablet, Rfl: 0 Continue all other maintenance medications as listed above.  Follow up plan: Return in about 3 months (around 06/02/2017).  Educational handout given for survey  Remus LofflerAngel S. Caydyn Sprung PA-C [facility] [address]  [place], [place] [id number] [phone]   03/02/2017, 10:02 AM

## 2017-03-02 NOTE — Patient Instructions (Signed)
In a few days you may receive a survey in the mail or online from [employer] regarding your visit with us today. Please take a moment to fill this out. Your feedback is very important to our whole office. It can help us better understand your needs as well as improve your experience and satisfaction. Thank you for taking your time to complete it. We care about you.  Philena Obey, PA-C  

## 2017-03-03 ENCOUNTER — Encounter (HOSPITAL_COMMUNITY): Payer: Medicare HMO

## 2017-03-15 ENCOUNTER — Encounter (HOSPITAL_COMMUNITY)
Admission: RE | Admit: 2017-03-15 | Discharge: 2017-03-15 | Disposition: A | Discharge status: Home / Self Care | Payer: Medicare HMO | Source: Ambulatory Visit | Attending: Physician Assistant | Admitting: Physician Assistant

## 2017-03-15 ENCOUNTER — Encounter (HOSPITAL_COMMUNITY): Payer: Self-pay

## 2017-03-15 DIAGNOSIS — M810 Age-related osteoporosis without current pathological fracture: Secondary | ICD-10-CM | POA: Diagnosis present

## 2017-03-15 MED ORDER — SODIUM CHLORIDE 0.9 % IV SOLN
Freq: Once | INTRAVENOUS | Status: AC
  Administered 2017-03-15: 250 mL via INTRAVENOUS

## 2017-03-15 MED ORDER — ZOLEDRONIC ACID 5 MG/100ML IV SOLN
INTRAVENOUS | Status: AC
  Filled 2017-03-15: qty 100

## 2017-03-15 MED ORDER — ZOLEDRONIC ACID 5 MG/100ML IV SOLN
5.0000 mg | Freq: Once | INTRAVENOUS | Status: AC
  Administered 2017-03-15: 5 mg via INTRAVENOUS

## 2017-03-15 NOTE — Discharge Instructions (Signed)

## 2017-03-29 ENCOUNTER — Encounter: Payer: Self-pay | Admitting: Cardiovascular Disease

## 2017-03-29 ENCOUNTER — Ambulatory Visit: Payer: Medicare HMO | Admitting: Cardiovascular Disease

## 2017-03-29 VITALS — BP 127/76 | HR 77 | Ht 62.0 in | Wt 115.0 lb

## 2017-03-29 DIAGNOSIS — I10 Essential (primary) hypertension: Secondary | ICD-10-CM

## 2017-03-29 DIAGNOSIS — E785 Hyperlipidemia, unspecified: Secondary | ICD-10-CM | POA: Diagnosis not present

## 2017-03-29 DIAGNOSIS — I712 Thoracic aortic aneurysm, without rupture: Secondary | ICD-10-CM

## 2017-03-29 DIAGNOSIS — I351 Nonrheumatic aortic (valve) insufficiency: Secondary | ICD-10-CM | POA: Diagnosis not present

## 2017-03-29 DIAGNOSIS — I25718 Atherosclerosis of autologous vein coronary artery bypass graft(s) with other forms of angina pectoris: Secondary | ICD-10-CM | POA: Insufficient documentation

## 2017-03-29 DIAGNOSIS — I7121 Aneurysm of the ascending aorta, without rupture: Secondary | ICD-10-CM

## 2017-03-29 MED ORDER — ISOSORBIDE MONONITRATE ER 60 MG PO TB24: 60.0000 mg | ORAL_TABLET | Freq: Every day | ORAL | 3 refills | Status: DC

## 2017-03-29 NOTE — Patient Instructions (Signed)
Dr Royann Shiversroitoru has recommended making the following medication changes: 1. INCREASE Isosorbide to 60 mg daily  Please call back in about 1 month to speak with a nurse, let us know how you feel on this increased dose of Isosorbide.  Dr Royann Shiversroitoru recommends that you schedule a follow-up appointment in 6 months. You will receive a reminder letter in the mail two months in advance. If you don't receive a letter, please call our office to schedule the follow-up appointment.  If you need a refill on your cardiac medications before your next appointment, please call your pharmacy.

## 2017-03-29 NOTE — Progress Notes (Signed)
Cardiology Office Note    Date:  03/29/2017   ID:  Evelyn [name], DOB 1941-01-01, MRN [id number]  PCP:  Remus Loffler, PA-C  Cardiologist:   Thurmon Fair, MD   No chief complaint on file.   History of Present Illness:  Evelyn [name] is a 76 y.o. female with long-standing history of coronary artery disease with previous stents to LAD followed by bypass surgery (LIMA to LAD, SVG to diagonal, SVG to OM in 2004), hyperlipidemia here for follow-up after undergoing cardiac catheterization.  Angiography showed virtually no change in coronary anatomy since 2013.  She has a chronically occluded LAD artery with multiple occluded stents, but has good downstream blood flow via LIMA-LAD and SVG-diagonal bypasses.  There is probably a significant area of myocardial septum that is ischemic, upstream of the blocked mid LAD.  There was no mention of a SVG bypass to the oblique marginal branch, but the left circumflex coronary artery was widely patent with no more than 20% stenosis (Chest CTA showed that the SVG-OM is now occluded).  Left ventricular regional wall motion and overall systolic function remain normal and the LVEDP was only 14 mmHg.  Although the chest CT angiogram suggested there was a 70% stenosis in the left main coronary artery, that potentially could lead to ischemia in the circumflex territory, this was not found to be the case by invasive angiography.  Symptoms of exertional chest pain have improved substantially after we started the isosorbide mononitrate.  She has not had any chest pain in 3 or 4 days and only had the need to take sublingual nitroglycerin once or twice last week.  She still complains of shortness of breath walking a relatively short distance to her son's house, probably less than 100 feet.  Had an echocardiogram in May, showing normal LVEF 55-60%, with inferior and inferoseptal mild hypokinesis, mildly dilated ascending aorta, mild aortic insufficiency. In  September 2017 her treadmill nuclear stress test showed normal myocardial perfusion and EF 62%. In October 2017 she had the last evaluation of her aorta, showing mild aneurysmal dilation of the ascending aorta at 3.7 cm and of the aortic arch at 3.3 cm. (She is a very small frame lady, 5 foot 2 inches tall, 112 pounds).  CT angiography of the aorta October 2018 showed unchanged aortic root dimension at 3.3 cm.  The SVG to OM was described as occluded.  The patient specifically denies any chest pain at rest, dyspnea at rest, orthopnea, paroxysmal nocturnal dyspnea, syncope, palpitations, focal neurological deficits, intermittent claudication, lower extremity edema (right leg, site of saphenectomy harvest sites is always a little larger), unexplained weight gain, cough, hemoptysis or wheezing.   She has long-standing history of coronary disease, undergoing her first cardiac catheterization in 1999. She received stents to the LAD artery in 2002 and had to have cutting balloon atherectomy for in-stent restenosis in 2003 followed by a new LAD stent in 2004 for disease progression. Ultimately after again developing restenosis she underwent coronary bypass surgery (LIMA to LAD, SVG to diagonal, SVG to OM in 2004). She had cardiac catheterization in July 2013. This demonstrated interval total occlusion of the proximal LAD artery although the distant bypasses are all widely patent. On her stress test in 2013 and 2017  the perfusion images were "normal". It is felt that she may have occasional angina pectoris related to ischemia in the proximal septum since there is no retrograde flow from the bypasses to the proximal LAD. Her last echo  in May 2018 showed normal EF 55-60%, but reported inferior and inferoseptal mild hypokinesis. 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  CT angiogram of the head and neck performed in May 2018 for TIA showed mild extracranial  atherosclerosis. She has treated systemic hypertension and hyperlipidemia and hypothyroidism.  She had previous cervical spine surgery with bone grafting and discectomy in 2009.   Past Medical History:  Diagnosis Date  . Aneurysm (HCC)   . Aortic insufficiency   . CAD (coronary artery disease)    CABG LIMA to LAD,vein graft to diagonal,vein graft to OM  . Dyslipidemia   . GERD (gastroesophageal reflux disease)   . Hypothyroid     Past Surgical History:  Procedure Laterality Date  . ABDOMINAL HYSTERECTOMY    . BACK SURGERY  2009  . CORONARY ANGIOPLASTY  2003   cutting balloon atherectomy in-stent stenosis of the LAD.  [name]. CORONARY ANGIOPLASTY WITH STENT PLACEMENT  2002   remote PCI & stenting LAD,  . CORONARY ARTERY BYPASS GRAFT  2004   LIMA to LAD,vein graft to diagonal,vein graft to OM  . FOOT SURGERY    . NECK SURGERY    . NM MYOCAR PERF WALL MOTION  12/01/2011   normal study-persistent exercise induced ECG changes raises the concern for ischemia (balanced ischemia).  . NOSE SURGERY    . TONSILLECTOMY  1948  . TUBAL LIGATION      Current Medications: Outpatient Medications Prior to Visit  Medication Sig Dispense Refill  . ALPRAZolam (XANAX) 0.25 MG tablet Take 0.25 mg 3 (three) times daily as needed by mouth for anxiety.    [name]. aspirin EC 325 MG tablet Take 325 mg by mouth daily.    . furosemide (LASIX) 20 MG tablet TAKE 1 TABLET (20 MG TOTAL) BY MOUTH DAILY. 90 tablet 1  . HYDROcodone-acetaminophen (NORCO/VICODIN) 5-325 MG tablet Take 1 tablet by mouth every 6 (six) hours as needed for moderate pain. 120 tablet 0  . hydrocortisone 2.5 % cream Apply topically 2 (two) times daily. 30 g 0  . isosorbide mononitrate (IMDUR) 30 MG 24 hr tablet Take 1 tablet (30 mg total) by mouth daily. 30 tablet 6  . levothyroxine (SYNTHROID, LEVOTHROID) 112 MCG tablet Take 1 tablet (112 mcg total) by mouth daily before breakfast. 30 tablet 6  . loratadine (CLARITIN) 10 MG tablet Take 1 tablet  (10 mg total) by mouth daily. 30 tablet 11  . metoprolol tartrate (LOPRESSOR) 100 MG tablet Take 100 mg 2 (two) times daily by mouth.    . nitroGLYCERIN (NITROSTAT) 0.4 MG SL tablet Place 1 tablet (0.4 mg total) under the tongue every 5 (five) minutes as needed for chest pain. 25 tablet 3  . rosuvastatin (CRESTOR) 40 MG tablet Take 1 tablet (40 mg total) by mouth daily. 30 tablet 11  . Zoledronic Acid (RECLAST IV) Inject into the vein. Once yearly    . ALPRAZolam (XANAX) 0.25 MG tablet Take 1 tablet (0.25 mg total) by mouth 3 (three) times daily. (Patient not taking: Reported on 03/29/2017) 90 tablet 1  . HYDROcodone-acetaminophen (NORCO/VICODIN) 5-325 MG tablet Take 1 tablet by mouth every 6 (six) hours as needed for moderate pain. 120 tablet 0  . HYDROcodone-acetaminophen (NORCO/VICODIN) 5-325 MG tablet Take 1 tablet by mouth every 6 (six) hours as needed for moderate pain. Fill 60 days from original script date 120 tablet 0  . metoprolol tartrate (LOPRESSOR) 100 MG tablet TAKE 1 TABLET TWICE  A DAY (Patient not taking: Reported on 03/29/2017) 60 tablet 3   No facility-administered medications prior to visit.      Allergies:   Sulfa antibiotics   Social History   Socioeconomic History  . Marital status: Married    Spouse name: None  . Number of children: 2  . Years of education: Associates  . Highest education level: None  Social Needs  . Financial resource strain: None  . Food insecurity - worry: None  . Food insecurity - inability: None  . Transportation needs - medical: None  . Transportation needs - non-medical: None  Occupational History  . Occupation: Retired  Tobacco Use  . Smoking status: Never Smoker  . Smokeless tobacco: Never Used  Substance and Sexual Activity  . Alcohol use: No  . Drug use: No  . Sexual activity: None  Other Topics Concern  . None  Social History Narrative   Mother of 2 with 4 grandchildren   Right-handed   Caffeine: 8-10 cups coffee      Family History:  The patient's family history includes Asthma in her maternal grandmother; Heart attack in her mother; Heart failure in her father; Hypertension in her father.   ROS:   Please see the history of present illness.    ROS All other systems reviewed and are negative.   PHYSICAL EXAM:   VS:  BP 127/76   Pulse 77   Ht 5\' 2"  (1.575 m)   Wt 115 lb (52.2 kg)   BMI 21.03 kg/m      General: Alert, oriented x3, no distress, lean and small frame Head: no evidence of trauma, PERRL, EOMI, no exophtalmos or lid lag, no myxedema, no xanthelasma; normal ears, nose and oropharynx Neck: normal jugular venous pulsations and no hepatojugular reflux; brisk carotid pulses without delay and no carotid bruits Chest: clear to auscultation, no signs of consolidation by percussion or palpation, normal fremitus, symmetrical and full respiratory excursions Cardiovascular: normal position and quality of the apical impulse, regular rhythm, normal first and second heart sounds, no murmurs, rubs or gallops Abdomen: no tenderness or distention, no masses by palpation, no abnormal pulsatility or arterial bruits, normal bowel sounds, no hepatosplenomegaly Extremities: no clubbing, cyanosis or edema; 2+ radial, ulnar and brachial pulses bilaterally; 2+ right femoral, posterior tibial and dorsalis pedis pulses; 2+ left femoral, posterior tibial and dorsalis pedis pulses; no subclavian or femoral bruits Neurological: grossly nonfocal Psych: Normal mood and affect    Wt Readings from Last 3 Encounters:  03/29/17 115 lb (52.2 kg)  03/15/17 114 lb (51.7 kg)  03/02/17 114 lb 9.6 oz (52 kg)      Studies/Labs Reviewed:   EKG:  EKG is not ordered today.  Recent Labs: 07/21/2016: ALT 13 11/18/2016: TSH 6.580 02/21/2017: BUN 10; Creatinine, Ser 0.59; Hemoglobin 13.5; Platelets 260; Potassium 4.0; Sodium 141   Lipid Panel    Component Value Date/Time   CHOL 157 07/21/2016 0935   TRIG 76 07/21/2016 0935    HDL 56 07/21/2016 0935   CHOLHDL 2.8 07/21/2016 0935   CHOLHDL 2.0 07/11/2014 0913   VLDL 12 07/11/2014 0913   LDLCALC 86 07/21/2016 0935     ASSESSMENT:    No diagnosis found.   PLAN:  In order of problems listed above:  1. CAD s/p CABG:  probably has proximal septal ischemia.  Angina has improved substantially with long-acting nitrates, but does still occur occasionally.  Will increase to 60 mg daily.  She is already on very high  dose beta-blocker. 2. Asc Ao Aneurysm: Very mild at 3.3 cm, stable in size.  I do not think we need to continue to monitor this unless she develops new symptoms. 3. HTN: Well-controlled 4. HLP: On maximum dose rosuvastatin.  LDL is not at target less than 70, but is much much better than her baseline, well over 50% reduction.  There has been no progression in native coronary artery disease over the last 5 years.  She is reluctant to take on the cost of ezetimibe.  Will continue same medications. 5. AI: Not hemodynamically significant   Medication Adjustments/Labs and Tests Ordered: Current medicines are reviewed at length with the patient today.  Concerns regarding medicines are outlined above.  Medication changes, Labs and Tests ordered today are listed in the Patient Instructions below. There are no Patient Instructions on file for this visit.   Signed, Thurmon FairMihai Janeann Paisley, MD  03/29/2017 11:01 AM    [facility] [address], [place], [place]  [address] Phone: [phone]; Fax: [phone]

## 2017-04-06 ENCOUNTER — Telehealth: Payer: Self-pay | Admitting: Cardiovascular Disease

## 2017-04-06 NOTE — Telephone Encounter (Signed)
Spoke w patient. Reporting headache & assoc symptoms which she's unsure is cold-related or due to medication (recent increase on imdur when seen 11/13).  Advised her to avoid -D, -DM OTC cold meds, OK to take coricidin or similar heart-safe cold medications. However, advised that if the headache does not improve after a few days to give us a call as it may be related to the imdur.   Pt verbalized understanding and thanks.

## 2017-04-06 NOTE — Telephone Encounter (Signed)
Mrs. Evelyn [name] is calling to see what over the counter medication she can take for a cold . Please call

## 2017-05-04 ENCOUNTER — Telehealth: Payer: Self-pay | Admitting: Cardiovascular Disease

## 2017-05-04 NOTE — Telephone Encounter (Signed)
Patient calling,states that she was instructed to call back to report how the medications worked to treat SOB. Patient will not be available after 4pm but can be reached at mobile number of [phone] (after 4pm)  just for today.

## 2017-05-06 NOTE — Telephone Encounter (Signed)
Increase to 90 mg then please

## 2017-05-06 NOTE — Telephone Encounter (Signed)
Returned call to patient (who called in on 12/19). She had been having a lot of problems with SOB. She states her SOB has not gone away, nor has improved much. She reports SOB with minimal exertion, when bending over for example. She reports less chest pain since imdur was increased. Patient does not monitor home BP, but reports it is generally always good. Advised to continue current meds and I will forward message to MD to review/advise. She voiced understanding.

## 2017-05-06 NOTE — Telephone Encounter (Signed)
Called patient. She currently takes isosorbide mononitrate (imdur) 60mg  daily.   Will routed to MD for clarification on med/dose

## 2017-05-06 NOTE — Telephone Encounter (Signed)
Please ask her to try increasing the Imdur to 60 mg once daily. MCr

## 2017-05-06 NOTE — Telephone Encounter (Signed)
Attempted to call patient x2; line busy

## 2017-05-09 MED ORDER — ISOSORBIDE MONONITRATE ER 60 MG PO TB24: 90.0000 mg | ORAL_TABLET | Freq: Every day | ORAL | 3 refills | Status: DC

## 2017-05-09 NOTE — Telephone Encounter (Signed)
Spoke with pt, aware of dr croitoru's recommendations.  

## 2017-05-12 ENCOUNTER — Telehealth: Payer: Self-pay | Admitting: Physician Assistant

## 2017-05-12 NOTE — Telephone Encounter (Signed)
Do not see where patient was taken off of Crestor. Please review. Covering PCP

## 2017-05-26 ENCOUNTER — Other Ambulatory Visit: Payer: Self-pay | Admitting: Cardiovascular Disease

## 2017-05-30 ENCOUNTER — Ambulatory Visit: Payer: Medicare HMO | Admitting: "Endocrinology

## 2017-06-08 ENCOUNTER — Other Ambulatory Visit: Payer: Self-pay | Admitting: "Endocrinology

## 2017-06-08 DIAGNOSIS — E039 Hypothyroidism, unspecified: Secondary | ICD-10-CM

## 2017-06-09 ENCOUNTER — Other Ambulatory Visit: Payer: Medicare HMO

## 2017-06-09 DIAGNOSIS — E039 Hypothyroidism, unspecified: Secondary | ICD-10-CM | POA: Diagnosis not present

## 2017-06-10 ENCOUNTER — Other Ambulatory Visit: Payer: Self-pay | Admitting: "Endocrinology

## 2017-06-10 LAB — T4, FREE: FREE T4: 2.41 ng/dL — AB (ref 0.82–1.77)

## 2017-06-10 LAB — TSH: TSH: <0.006 u[IU]/mL — ABNORMAL LOW (ref 0.450–4.500)

## 2017-06-10 MED ORDER — LEVOTHYROXINE SODIUM 100 MCG PO TABS: 100.0000 ug | ORAL_TABLET | Freq: Every day | ORAL | 2 refills | Status: DC

## 2017-06-16 ENCOUNTER — Ambulatory Visit: Payer: Medicare HMO | Admitting: "Endocrinology

## 2017-06-16 ENCOUNTER — Ambulatory Visit (INDEPENDENT_AMBULATORY_CARE_PROVIDER_SITE_OTHER): Payer: Medicare HMO | Admitting: "Endocrinology

## 2017-06-16 ENCOUNTER — Encounter: Payer: Self-pay | Admitting: "Endocrinology

## 2017-06-16 VITALS — BP 138/86 | HR 62 | Ht 62.0 in | Wt 113.0 lb

## 2017-06-16 DIAGNOSIS — E042 Nontoxic multinodular goiter: Secondary | ICD-10-CM

## 2017-06-16 DIAGNOSIS — E039 Hypothyroidism, unspecified: Secondary | ICD-10-CM

## 2017-06-16 NOTE — Progress Notes (Signed)
Subjective:    Patient ID: Evelyn [name], female    DOB: 26-Feb-1941, PCP Remus Loffler, PA-C   Past Medical History:  Diagnosis Date  . Aneurysm (HCC)   . Aortic insufficiency   . CAD (coronary artery disease)    CABG LIMA to LAD,vein graft to diagonal,vein graft to OM  . Dyslipidemia   . GERD (gastroesophageal reflux disease)   . Hypothyroid    Past Surgical History:  Procedure Laterality Date  . ABDOMINAL HYSTERECTOMY    . BACK SURGERY  2009  . CORONARY ANGIOPLASTY  2003   cutting balloon atherectomy in-stent stenosis of the LAD.  [name] CORONARY ANGIOPLASTY WITH STENT PLACEMENT  2002   remote PCI & stenting LAD,  . CORONARY ARTERY BYPASS GRAFT  2004   LIMA to LAD,vein graft to diagonal,vein graft to OM  . FOOT SURGERY    . LEFT HEART CATH AND CORS/GRAFTS ANGIOGRAPHY N/A 02/23/2017   Procedure: LEFT HEART CATH AND CORS/GRAFTS ANGIOGRAPHY;  Surgeon: Iran Ouch, MD;  Location: [facility]ASIVE [facility];  Service: Cardiovascular;  Laterality: N/A;  . LEFT HEART CATHETERIZATION WITH CORONARY ANGIOGRAM N/A 12/15/2011   Procedure: LEFT HEART CATHETERIZATION WITH CORONARY ANGIOGRAM;  Surgeon: Thurmon Fair, MD;  Location: [facility] CATH [facility];  Service: Cardiovascular;  Laterality: N/A;  . NECK SURGERY    . NM MYOCAR PERF WALL MOTION  12/01/2011   normal study-persistent exercise induced ECG changes raises the concern for ischemia (balanced ischemia).  . NOSE SURGERY    . TONSILLECTOMY  1948  . TUBAL LIGATION     Social History   Socioeconomic History  . Marital status: Married    Spouse name: None  . Number of children: 2  . Years of education: Associates  . Highest education level: None  Social Needs  . Financial resource strain: None  . Food insecurity - worry: None  . Food insecurity - inability: None  . Transportation needs - medical: None  . Transportation needs - non-medical: None  Occupational History  . Occupation: Retired  Tobacco Use  . Smoking status: Never  Smoker  . Smokeless tobacco: Never Used  Substance and Sexual Activity  . Alcohol use: No  . Drug use: No  . Sexual activity: None  Other Topics Concern  . None  Social History Narrative   Mother of 2 with 4 grandchildren   Right-handed   Caffeine: 8-10 cups coffee   Outpatient Encounter Medications as of 06/16/2017  Medication Sig  . ALPRAZolam (XANAX) 0.25 MG tablet Take 0.25 mg 3 (three) times daily as needed by mouth for anxiety.  [name] aspirin EC 325 MG tablet Take 325 mg by mouth daily.  . furosemide (LASIX) 20 MG tablet TAKE 1 TABLET (20 MG TOTAL) BY MOUTH DAILY.  [name] HYDROcodone-acetaminophen (NORCO/VICODIN) 5-325 MG tablet Take 1 tablet by mouth every 6 (six) hours as needed for moderate pain.  . hydrocortisone 2.5 % cream Apply topically 2 (two) times daily.  . isosorbide mononitrate (IMDUR) 60 MG 24 hr tablet Take 1.5 tablets (90 mg total) by mouth daily.  [name] levothyroxine (SYNTHROID, LEVOTHROID) 100 MCG tablet Take 1 tablet (100 mcg total) by mouth daily before breakfast.  . loratadine (CLARITIN) 10 MG tablet Take 1 tablet (10 mg total) by mouth daily.  . metoprolol tartrate (LOPRESSOR) 100 MG tablet Take 100 mg 2 (two) times daily by mouth.  . nitroGLYCERIN (NITROSTAT) 0.4 MG SL tablet PLACE 1 TABLET (0.4 MG TOTAL) UNDER THE TONGUE EVERY 5 (FIVE)  MINUTES AS NEEDED FOR CHEST PAIN.  . rosuvastatin (CRESTOR) 40 MG tablet Take 1 tablet (40 mg total) by mouth daily.  . Zoledronic Acid (RECLAST IV) Inject into the vein. Once yearly   No facility-administered encounter medications on file as of 06/16/2017.    ALLERGIES: Allergies  Allergen Reactions  . Sulfa Antibiotics Swelling   VACCINATION STATUS: Immunization History  Administered Date(s) Administered  . Influenza, High Dose Seasonal PF 03/09/2016, 03/02/2017    HPI  77 year old female with medical history as above. She is being seen in f/u  For  hypothyroidism and multinodular goiter confirmed by dedicated thyroid/neck  ultrasound, after incidental finding of nodular goiter by a CT scan. - She is status post FNA 3 nodules in her thyroid. - She has history of hypothyroidism for the last 4 years currently on, levothyroxine 112 g by mouth every morning. -Her previous thyroid function tests are consistent with over replacement. - She denies family history of thyroid dysfunction. She feels occasional choking sensation in her neck, no recent voice change nor shortness of breath. She denies any exposure to neck radiation. - She denies any history of smoking.  Review of Systems Constitutional: She has a steady body weight , no fatigue, no subjective hyperthermia/hypothermia Eyes: no blurry vision, no xerophthalmia ENT: no sore throat, + nodules palpated in throat,  + intermittent dysphagia/odynophagia, no hoarseness Cardiovascular: no chest pain, shortness of breath, palpitations.   Respiratory: no cough/SOB Gastrointestinal: no N/V/D/C Musculoskeletal: no muscle/joint aches Skin: no rashes Neurological: denies Tremors, tingling. Psychiatric: no depression/anxiety  Objective:    BP 138/86   Pulse 62   Ht 5\' 2"  (1.575 m)   Wt 113 lb (51.3 kg)   BMI 20.67 kg/m   Wt Readings from Last 3 Encounters:  06/16/17 113 lb (51.3 kg)  03/29/17 115 lb (52.2 kg)  03/15/17 114 lb (51.7 kg)    Physical Exam  Constitutional: Not in acute distress, normal state of mind. 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, strength intact in all 4 Skin: moist, warm, no rashes Neurological: - tremor with outstretched hands, DTR normal in all 4  CMP     Component Value Date/Time   NA 141 02/21/2017 1148   K 4.0 02/21/2017 1148   CL 104 02/21/2017 1148   CO2 23 02/21/2017 1148   GLUCOSE 88  02/21/2017 1148   GLUCOSE 99 09/01/2016 0550   BUN 10 02/21/2017 1148   CREATININE 0.59 02/21/2017 1148   CREATININE 0.59 07/11/2014 0913   CALCIUM 9.4 02/21/2017 1148   PROT 6.4 07/21/2016 0935   ALBUMIN 4.1 07/21/2016 0935   AST 12 07/21/2016 0935   ALT 13 07/21/2016 0935   ALKPHOS 55 07/21/2016 0935   BILITOT 0.4 07/21/2016 0935   GFRNONAA 88 02/21/2017 1148   GFRAA 102 02/21/2017 1148   Recent Results (from the past 2160 hour(s))  T4, Free     Status: Abnormal   Collection Time: 06/09/17 10:40 AM  Result Value Ref Range   Free T4 2.41 (H) 0.82 - 1.77 ng/dL  TSH     Status: Abnormal   Collection Time: 06/09/17 10:40 AM  Result Value Ref Range   TSH <0.006 (L) 0.450 - 4.500 uIU/mL   Fine-needle aspiration samples  of 3 nodules in her thyroid is benign   Assessment & Plan:  1. Other specified hypothyroidism 2. Multinodular goiter  -Her previsit thyroid function tests are consistent with over replacement.  I advised her to lower her levothyroxine to 100 mcg p.o. every morning.     - We discussed about correct intake of levothyroxine, at fasting, with water, separated by at least 30 minutes from breakfast, and separated by more than 4 hours from calcium, iron, multivitamins, acid reflux medications (PPIs). -Patient is made aware of the fact that thyroid hormone replacement is needed for life, dose to be adjusted by periodic monitoring of thyroid function tests.  - She was found to have clinical goiter last visit and one of her complaints was intermittent choking sensation in her neck. She just  underwent dedicated thyroid/neck ultrasound which is significant for multinodular goiter. Two nodules on the right (3.3 cm and 1.5 cm); one nodule on the left (2.8 cm) - Biopsy off  3 nodules is negative for malignancy. He will not need intervention at this time. She may need repeat ultrasound 1-2 years from now.  - I advised patient to maintain close follow up with Remus Loffler,  PA-C for primary care needs. Follow up plan: Return in about 3 months (around 09/13/2017) for follow up with pre-visit labs.  Marquis Lunch, MD Phone: [phone]  Fax: [phone]  -  This note was partially dictated with voice recognition software. Similar sounding words can be transcribed inadequately or may not  be corrected upon review.  06/16/2017, 1:04 PM

## 2017-07-08 ENCOUNTER — Telehealth: Payer: Self-pay | Admitting: Cardiovascular Disease

## 2017-07-08 NOTE — Telephone Encounter (Signed)
Tried to call back-phone just keeps ringing no VM

## 2017-07-08 NOTE — Telephone Encounter (Signed)
Returned call to patient of Dr. Salena Saner  She had a chest pain/tachycardic episode about 1.5 weeks ago. The pain went down her back/arm, she got nauseous. This episode lasted 20 minutes. A similar episode occurred today, lasting 20 minutes. She took NTGx1 today with some relief. Advised she got to ED for acute chest pain symptoms. She states she will go to ED if she has another episode. Scheduled for OV on Feb 27 with Harriet PhoK. Lawrence, DNP  Routed to Dr. Salena Saner as Lorain ChildesFYI

## 2017-07-08 NOTE — Telephone Encounter (Signed)
Patient will be available after 12:30 pm today  Patient c/o Palpitations:  High priority if patient c/o lightheadedness, shortness of breath, or chest pain  1) How long have you had palpitations/irregular HR/ Afib? Are you having the symptoms now? Twice in the last week and half  2) Are you currently experiencing lightheadedness, SOB or CP? Chest pain   3) Do you have a history of afib (atrial fibrillation) or irregular heart rhythm? Yes   4) Have you checked your BP or HR? (document readings if available): No   5) Are you experiencing any other symptoms? Arm pain, nausea

## 2017-07-12 NOTE — Progress Notes (Signed)
Cardiology Office Note   Date:  07/13/2017   ID:  Evelyn [name], DOB 1941-05-01, MRN [id number]  PCP:  Remus Loffler, PA-C  Cardiologist: Dr.  Royann Shivers Chief Complaint  Patient presents with  . Chest Pain  . AAA  . Coronary Artery Disease     History of Present Illness: Evelyn [name] is a 77 y.o. female who presents for ongoing assessment and management of CAD, history of stent to the LAD, coronary artery bypass grafting (LIMA to LAD, SVG to diagonal, SVG to OM 2004), ascending aortic aneurysm, hypertension, and hyperlipidemia. Most recent cardiac catheterization in November 2018 revealed virtually no change in coronary anatomy 2013.  Due to recurrent chest discomfort she had been placed on isosorbide mononitrate which helped her symptoms significantly. He was last seen by Dr. Royann Shivers on 03/29/2017. Isosorbide was increased to 60 mg daily. No further testing or medication adjustments were made  The patient called our office on 07/08/2017 with complaints of recurrent chest discomfort, tachycardia, with pain radiating down her back and arm associated nausea. Episode lasted approximately 20 minutes. She had a similar episode about a week and a half earlier.  She states that she can't nitroglycerin once the pain went away after. Since that time she had no further complaints.  Past Medical History:  Diagnosis Date  . Aneurysm (HCC)   . Aortic insufficiency   . CAD (coronary artery disease)    CABG LIMA to LAD,vein graft to diagonal,vein graft to OM  . Dyslipidemia   . GERD (gastroesophageal reflux disease)   . Hypothyroid     Past Surgical History:  Procedure Laterality Date  . ABDOMINAL HYSTERECTOMY    . BACK SURGERY  2009  . CORONARY ANGIOPLASTY  2003   cutting balloon atherectomy in-stent stenosis of the LAD.  [name] CORONARY ANGIOPLASTY WITH STENT PLACEMENT  2002   remote PCI & stenting LAD,  . CORONARY ARTERY BYPASS GRAFT  2004   LIMA to LAD,vein graft to diagonal,vein  graft to OM  . FOOT SURGERY    . LEFT HEART CATH AND CORS/GRAFTS ANGIOGRAPHY N/A 02/23/2017   Procedure: LEFT HEART CATH AND CORS/GRAFTS ANGIOGRAPHY;  Surgeon: Iran Ouch, MD;  Location: [facility];  Service: Cardiovascular;  Laterality: N/A;  . LEFT HEART CATHETERIZATION WITH CORONARY ANGIOGRAM N/A 12/15/2011   Procedure: LEFT HEART CATHETERIZATION WITH CORONARY ANGIOGRAM;  Surgeon: Thurmon Fair, MD;  Location: [facility]ATH [facility];  Service: Cardiovascular;  Laterality: N/A;  . NECK SURGERY    . NM MYOCAR PERF WALL MOTION  12/01/2011   normal study-persistent exercise induced ECG changes raises the concern for ischemia (balanced ischemia).  . NOSE SURGERY    . TONSILLECTOMY  1948  . TUBAL LIGATION       Current Outpatient Medications  Medication Sig Dispense Refill  . ALPRAZolam (XANAX) 0.25 MG tablet Take 0.25 mg 3 (three) times daily as needed by mouth for anxiety.    [name] aspirin EC 325 MG tablet Take 325 mg by mouth daily.    . furosemide (LASIX) 20 MG tablet TAKE 1 TABLET (20 MG TOTAL) BY MOUTH DAILY. 90 tablet 1  . HYDROcodone-acetaminophen (NORCO/VICODIN) 5-325 MG tablet Take 1 tablet by mouth every 6 (six) hours as needed for moderate pain. 120 tablet 0  . hydrocortisone 2.5 % cream Apply topically 2 (two) times daily. 30 g 0  . isosorbide mononitrate (IMDUR) 60 MG 24 hr tablet Take 1.5 tablets (90 mg total) by mouth daily. 90 tablet 3  .  levothyroxine (SYNTHROID, LEVOTHROID) 100 MCG tablet Take 1 tablet (100 mcg total) by mouth daily before breakfast. 30 tablet 2  . loratadine (CLARITIN) 10 MG tablet Take 1 tablet (10 mg total) by mouth daily. 30 tablet 11  . metoprolol tartrate (LOPRESSOR) 100 MG tablet Take 100 mg 2 (two) times daily by mouth.    . nitroGLYCERIN (NITROSTAT) 0.4 MG SL tablet PLACE 1 TABLET (0.4 MG TOTAL) UNDER THE TONGUE EVERY 5 (FIVE) MINUTES AS NEEDED FOR CHEST PAIN. 25 tablet 0  . rosuvastatin (CRESTOR) 40 MG tablet Take 1 tablet (40 mg total) by mouth  daily. 30 tablet 11  . Zoledronic Acid (RECLAST IV) Inject into the vein. Once yearly     No current facility-administered medications for this visit.     Allergies:   Sulfa antibiotics    Social History:  The patient  reports that  has never smoked. she has never used smokeless tobacco. She reports that she does not drink alcohol or use drugs.   Family History:  The patient's family history includes Asthma in her maternal grandmother; Heart attack in her mother; Heart failure in her father; Hypertension in her father.    ROS: All other systems are reviewed and negative. Unless otherwise mentioned in H&P    PHYSICAL EXAM: VS:  BP 122/60   Pulse 61   Ht 5\' 2"  (1.575 m)   Wt 112 lb 3.2 oz (50.9 kg)   SpO2 95%   BMI 20.52 kg/m  , BMI Body mass index is 20.52 kg/m. GEN: Well nourished, well developed, in no acute distress  HEENT: normal  Neck: no JVD, carotid bruits, or masses Cardiac: RRR; no murmurs, rubs, or gallops,no edema  Respiratory:  clear to auscultation bilaterally, normal work of breathing GI: soft, nontender, nondistended, + BS. No Murphy's sign. MS: no deformity or atrophy  Skin: warm and dry, no rash Neuro:  Strength and sensation are intact Psych: euthymic mood, full affect   Recent Labs: 07/21/2016: ALT 13 02/21/2017: BUN 10; Creatinine, Ser 0.59; Hemoglobin 13.5; Platelets 260; Potassium 4.0; Sodium 141 06/09/2017: TSH <0.006    Lipid Panel    Component Value Date/Time   CHOL 157 07/21/2016 0935   TRIG 76 07/21/2016 0935   HDL 56 07/21/2016 0935   CHOLHDL 2.8 07/21/2016 0935   CHOLHDL 2.0 07/11/2014 0913   VLDL 12 07/11/2014 0913   LDLCALC 86 07/21/2016 0935      Wt Readings from Last 3 Encounters:  07/13/17 112 lb 3.2 oz (50.9 kg)  06/16/17 113 lb (51.3 kg)  03/29/17 115 lb (52.2 kg)      Other studies Reviewed: Cardiac Cath and 10/10/ 2018 Conclusion     The left ventricular systolic function is normal.  LV end diastolic pressure is  mildly elevated.  The left ventricular ejection fraction is 55-65% by visual estimate.  Ost LAD to Mid LAD lesion, 100 %stenosed.  Ost Cx lesion, 20 %stenosed.  LM lesion, 20 %stenosed.  Prox RCA lesion, 20 %stenosed.  SVG and is normal in caliber and anatomically normal.  LIMA and is normal in caliber and anatomically normal.   1. Significant underlying one-vessel coronary artery disease with chronically occluded stents in the LAD. Patent LIMA to LAD and patent SVG to first diagonal. No significant obstructive disease affecting the distal left main or ostial left circumflex. The SVG to OM is known to be occluded and was not injected.  2. Normal LV systolic function mildly elevated left ventricular end-diastolic pressure.  Recommendations:  I don't see clear culprit for the patient's symptoms. Some of her symptoms might be related to ischemia in the proximal LAD distribution. Imdur 30 mg once daily was added.   Echocardiogram 10/12/2016 Left ventricle: The cavity size was normal. Systolic function was   normal. The estimated ejection fraction was in the range of 55%   to 60%. Mild hypokinesis of the inferior and inferoseptal   myocardium. Doppler parameters are consistent with abnormal left   ventricular relaxation (grade 1 diastolic dysfunction). Doppler   parameters are consistent with indeterminate ventricular filling   pressure. - Aortic valve: Transvalvular velocity was within the normal range.   There was no stenosis. There was mild regurgitation. - Aorta: Ascending aortic diameter: 37 mm (S). - Ascending aorta: The ascending aorta was mildly dilated. - Mitral valve: Transvalvular velocity was within the normal range.   There was no evidence for stenosis. There was mild regurgitation. - Right ventricle: The cavity size was normal. Wall thickness was   normal. Systolic function was normal. - Atrial septum: No defect or patent foramen ovale was identified   by color flow  Doppler or saline microcavitation study. - Tricuspid valve: There was moderate regurgitation. - Pulmonary arteries: Systolic pressure was within the normal   range. PA peak pressure: 27 mm Hg (S).  ASSESSMENT AND PLAN:  1. Coronary artery disease: His recent cardiac catheterization in 2018 as above. No new culprit lesion, felt that her angina was related to proximal LAD. Use current medication regimen as directed. The patient now admits that the first time she had chest discomfort was when she found out that her sister died. In later when she was getting ready for her memorial service. I will not plan any further testing at this time unless her symptoms become more frequent or severe. May consider repeating her catheterization but will discuss this first with Dr.Croitoru. She is to call us for any recurrent symptoms   2. AAA: Repeat ultrasound within the next weeks, and then have her follow-up with her primary cardiologist.  3. Hypertension: Blood pressure is well-controlled. No changes in her regimen.  4. Hypercholesterolemia: Continue statin therapy.  Current medicines are reviewed at length with the patient today.    Labs/ tests ordered today include:Abdominal ultrasound for  AAA surveillance.   Bettey MareKathryn M. Liborio NixonLawrence DNP, ANP, AACC   07/13/2017 1:17 PM    [facility] [address], [place], [place] [address] Phone: [phone]; Fax: [phone]

## 2017-07-13 ENCOUNTER — Encounter: Payer: Self-pay | Admitting: Adult Health

## 2017-07-13 ENCOUNTER — Ambulatory Visit: Payer: Medicare HMO | Admitting: Adult Health

## 2017-07-13 VITALS — BP 122/60 | HR 61 | Ht 62.0 in | Wt 112.2 lb

## 2017-07-13 DIAGNOSIS — I712 Thoracic aortic aneurysm, without rupture: Secondary | ICD-10-CM

## 2017-07-13 DIAGNOSIS — E7800 Pure hypercholesterolemia, unspecified: Secondary | ICD-10-CM

## 2017-07-13 DIAGNOSIS — I2510 Atherosclerotic heart disease of native coronary artery without angina pectoris: Secondary | ICD-10-CM | POA: Diagnosis not present

## 2017-07-13 DIAGNOSIS — I10 Essential (primary) hypertension: Secondary | ICD-10-CM | POA: Diagnosis not present

## 2017-07-13 DIAGNOSIS — I351 Nonrheumatic aortic (valve) insufficiency: Secondary | ICD-10-CM

## 2017-07-13 DIAGNOSIS — I7121 Aneurysm of the ascending aorta, without rupture: Secondary | ICD-10-CM

## 2017-07-13 NOTE — Progress Notes (Signed)
Agree. I do not think we need t repeat cath at this time. MCr

## 2017-07-13 NOTE — Patient Instructions (Signed)
Medication Instructions:  NO CHANGES-Your physician recommends that you continue on your current medications as directed. Please refer to the Current Medication list given to you today.  If you need a refill on your cardiac medications before your next appointment, please call your pharmacy.  Testing/Procedures: Your physician has requested that you have an abdominal aorta duplex. During this test, an ultrasound is used to evaluate the aorta. Allow 30 minutes for this exam. Do not eat after midnight the day before and avoid carbonated beverages  Follow-Up: Your physician wants you to follow-up in: 3-4 MONTHS WITH DR ZOXWRUEACROITORU.   Thank you for choosing [facility]!!

## 2017-07-22 ENCOUNTER — Ambulatory Visit (HOSPITAL_COMMUNITY)
Admission: RE | Admit: 2017-07-22 | Discharge: 2017-07-22 | Disposition: A | Discharge status: Home / Self Care | Payer: Medicare HMO | Source: Ambulatory Visit | Attending: Cardiology | Admitting: Cardiology

## 2017-07-22 DIAGNOSIS — I712 Thoracic aortic aneurysm, without rupture: Secondary | ICD-10-CM | POA: Diagnosis not present

## 2017-07-22 DIAGNOSIS — I351 Nonrheumatic aortic (valve) insufficiency: Secondary | ICD-10-CM | POA: Insufficient documentation

## 2017-07-22 DIAGNOSIS — I700 Atherosclerosis of aorta: Secondary | ICD-10-CM | POA: Diagnosis not present

## 2017-07-22 DIAGNOSIS — I7121 Aneurysm of the ascending aorta, without rupture: Secondary | ICD-10-CM

## 2017-07-25 ENCOUNTER — Other Ambulatory Visit: Payer: Self-pay

## 2017-07-25 DIAGNOSIS — I7121 Aneurysm of the ascending aorta, without rupture: Secondary | ICD-10-CM

## 2017-07-25 DIAGNOSIS — I712 Thoracic aortic aneurysm, without rupture, unspecified: Secondary | ICD-10-CM

## 2017-07-29 ENCOUNTER — Other Ambulatory Visit: Payer: Self-pay | Admitting: Physician Assistant

## 2017-08-01 ENCOUNTER — Encounter: Payer: Self-pay | Admitting: Physician Assistant

## 2017-08-01 ENCOUNTER — Ambulatory Visit: Payer: Medicare HMO | Admitting: Physician Assistant

## 2017-08-01 VITALS — BP 133/67 | HR 64 | Temp 96.6°F | Ht 62.0 in | Wt 113.0 lb

## 2017-08-01 DIAGNOSIS — E039 Hypothyroidism, unspecified: Secondary | ICD-10-CM | POA: Diagnosis not present

## 2017-08-01 DIAGNOSIS — E785 Hyperlipidemia, unspecified: Secondary | ICD-10-CM

## 2017-08-01 DIAGNOSIS — R52 Pain, unspecified: Secondary | ICD-10-CM

## 2017-08-01 DIAGNOSIS — S22080D Wedge compression fracture of T11-T12 vertebra, subsequent encounter for fracture with routine healing: Secondary | ICD-10-CM

## 2017-08-01 DIAGNOSIS — I10 Essential (primary) hypertension: Secondary | ICD-10-CM | POA: Diagnosis not present

## 2017-08-01 MED ORDER — FUROSEMIDE 20 MG PO TABS: 20.0000 mg | ORAL_TABLET | Freq: Every day | ORAL | 3 refills | Status: DC

## 2017-08-01 MED ORDER — HYDROCODONE-ACETAMINOPHEN 5-325 MG PO TABS: 1.0000 | ORAL_TABLET | Freq: Four times a day (QID) | ORAL | 0 refills | Status: DC | PRN

## 2017-08-01 MED ORDER — HYDROCODONE-ACETAMINOPHEN 5-325 MG PO TABS: 1.0000 | ORAL_TABLET | Freq: Four times a day (QID) | ORAL | 0 refills | Status: AC | PRN

## 2017-08-01 MED ORDER — ALPRAZOLAM 0.25 MG PO TABS: 0.2500 mg | ORAL_TABLET | Freq: Three times a day (TID) | ORAL | 5 refills | Status: DC | PRN

## 2017-08-01 MED ORDER — METOPROLOL TARTRATE 100 MG PO TABS: 100.0000 mg | ORAL_TABLET | Freq: Two times a day (BID) | ORAL | 11 refills | Status: DC

## 2017-08-01 NOTE — Patient Instructions (Signed)
In a few days you may receive a survey in the mail or online from [employer] regarding your visit with us today. Please take a moment to fill this out. Your feedback is very important to our whole office. It can help us better understand your needs as well as improve your experience and satisfaction. Thank you for taking your time to complete it. We care about you.  Azir Muzyka, PA-C  

## 2017-08-01 NOTE — Progress Notes (Signed)
BP 133/67 (BP Location: Left Arm)   Pulse 64   Temp (!) 96.6 F (35.9 C) (Oral)   Ht _0  (1.575 m)   Wt 113 lb (51.3 kg)   BMI 20.67 kg/m    Subjective:    Patient ID: Evelyn [name], female    DOB: 03/10/1941, 77 y.o.   MRN: [id number]  HPI: Evelyn [name] is a 77 y.o. 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  Due to the patient's cardiovascular status she cannot take NSAIDs.  She uses a limited amount of hydrocodone for severe pain.  It might be typically 90-100 in a month.  Chronic pain is under control, patient has no new complaints. Medications are keeping things stable. Needs refills for the next three months.  [place] Controlled Substance website checked and normal. Drug screen normal this year.  All of the medical conditions are reviewed and updated.  Lab work is reviewed and will be ordered as medically necessary. There are no new problems reported with today's visit.   Past Medical History:  Diagnosis Date  . Aneurysm ([place])   . Aortic insufficiency   . CAD (coronary artery disease)    CABG LIMA to LAD,vein graft to diagonal,vein graft to OM  . Dyslipidemia   . GERD (gastroesophageal reflux disease)   . Hypothyroid    Relevant past medical, surgical, family and social history reviewed and updated as indicated. Interim medical history since our last visit reviewed. Allergies and medications reviewed and updated. DATA REVIEWED: CHART IN EPIC  Family History reviewed for pertinent findings.  Review of Systems  Constitutional: Negative.   HENT: Negative.   Eyes: Negative.   Respiratory: Negative.   Gastrointestinal: Negative.   Genitourinary: Negative.     Allergies as of 08/01/2017      Reactions   Sulfa Antibiotics Swelling      Medication List        Accurate as of 08/01/17  2:35 PM. Always use your most recent med list.          ALPRAZolam 0.25 MG tablet Commonly known as:  XANAX Take 1 tablet (0.25 mg total) by mouth 3 (three) times daily as needed for anxiety.   aspirin EC 325 MG tablet Take 325 mg by mouth daily.   furosemide 20 MG tablet Commonly known as:  LASIX Take 1 tablet (20 mg total) by mouth daily.   HYDROcodone-acetaminophen 5-325 MG tablet Commonly known as:  NORCO/VICODIN Take 1 tablet by mouth every 6 (six) hours as needed for moderate pain.   HYDROcodone-acetaminophen 5-325 MG tablet Commonly known as:  NORCO/VICODIN Take 1 tablet by mouth every 6 (six) hours as needed for moderate pain. Start taking on:  08/30/2017   HYDROcodone-acetaminophen 5-325 MG tablet Commonly known as:  NORCO/VICODIN Take 1 tablet by mouth every 6 (six) hours as needed for moderate pain. Start taking on:  09/27/2017   hydrocortisone 2.5 % cream Apply topically 2 (two) times daily.   isosorbide mononitrate 60 MG 24 hr tablet Commonly known as:  IMDUR Take 1.5 tablets (90 mg total) by mouth daily.   levothyroxine 100 MCG tablet Commonly known as:  SYNTHROID, LEVOTHROID Take 1 tablet (100 mcg total) by mouth daily before breakfast.   loratadine 10 MG tablet Commonly known as:  CLARITIN Take 1 tablet (10 mg total) by mouth  daily.   metoprolol tartrate 100 MG tablet Commonly known as:  LOPRESSOR Take 1 tablet (100 mg total) by mouth 2 (two) times daily.   nitroGLYCERIN 0.4 MG SL tablet Commonly known as:  NITROSTAT PLACE 1 TABLET (0.4 MG TOTAL) UNDER THE TONGUE EVERY 5 (FIVE) MINUTES AS NEEDED FOR CHEST PAIN.   RECLAST IV Inject into the vein. Once yearly   rosuvastatin 40 MG tablet Commonly known as:  CRESTOR Take 1 tablet (40 mg total) by mouth daily.          Objective:    BP 133/67 (BP Location: Left Arm)   Pulse 64   Temp (!) 96.6 F (35.9 C) (Oral)   Ht _0  (1.575 m)   Wt 113 lb (51.3 kg)   BMI  20.67 kg/m   Allergies  Allergen Reactions  . Sulfa Antibiotics Swelling    Wt Readings from Last 3 Encounters:  08/01/17 113 lb (51.3 kg)  07/13/17 112 lb 3.2 oz (50.9 kg)  06/16/17 113 lb (51.3 kg)    Physical Exam  Constitutional: She is oriented to person, place, and time. She appears well-developed and well-nourished.  HENT:  Head: Normocephalic and atraumatic.  Eyes: Conjunctivae and EOM are normal. Pupils are equal, round, and reactive to light.  Cardiovascular: Normal rate, regular rhythm, normal heart sounds and intact distal pulses.  Pulmonary/Chest: Effort normal and breath sounds normal.  Abdominal: Soft. Bowel sounds are normal.  Neurological: She is alert and oriented to person, place, and time. She has normal reflexes.  Skin: Skin is warm and dry. No rash noted.  Psychiatric: She has a normal mood and affect. Her behavior is normal. Judgment and thought content normal.    Results for orders placed or performed in visit on 06/08/17  T4, Free  Result Value Ref Range   Free T4 2.41 (H) 0.82 - 1.77 ng/dL  TSH  Result Value Ref Range   TSH <0.006 (L) 0.450 - 4.500 uIU/mL      Assessment & Plan:   1. Essential hypertension - CMP14+EGFR; Future - Lipid panel; Future - CBC with Differential/Platelet; Future - VITAMIN D 25 Hydroxy (Vit-D Deficiency, Fractures); Future  2. Dyslipidemia - CMP14+EGFR; Future - Lipid panel; Future - CBC with Differential/Platelet; Future - VITAMIN D 25 Hydroxy (Vit-D Deficiency, Fractures); Future  3. Hypothyroidism, unspecified type - CMP14+EGFR; Future - Lipid panel; Future - CBC with Differential/Platelet; Future - VITAMIN D 25 Hydroxy (Vit-D Deficiency, Fractures); Future  4. Pain management   5. Closed wedge compression fracture of eleventh thoracic vertebra with routine healing, subsequent encounter - HYDROcodone-acetaminophen (NORCO/VICODIN) 5-325 MG tablet; Take 1 tablet by mouth every 6 (six) hours as needed for  moderate pain.  Dispense: 120 tablet; Refill: 0 - HYDROcodone-acetaminophen (NORCO/VICODIN) 5-325 MG tablet; Take 1 tablet by mouth every 6 (six) hours as needed for moderate pain.  Dispense: 120 tablet; Refill: 0 - HYDROcodone-acetaminophen (NORCO/VICODIN) 5-325 MG tablet; Take 1 tablet by mouth every 6 (six) hours as needed for moderate pain.  Dispense: 120 tablet; Refill: 0   Continue all other maintenance medications as listed above.  Follow up plan: Return in about 3 months (around 11/01/2017) for recheck.  Educational handout given for [place] PA-C [place] [address]  [place], [place] [address] [phone]   08/01/2017, 2:35 PM

## 2017-08-05 ENCOUNTER — Ambulatory Visit: Payer: Medicare HMO | Admitting: Physician Assistant

## 2017-09-07 ENCOUNTER — Other Ambulatory Visit: Payer: Self-pay | Admitting: "Endocrinology

## 2017-09-07 DIAGNOSIS — E039 Hypothyroidism, unspecified: Secondary | ICD-10-CM

## 2017-09-09 ENCOUNTER — Other Ambulatory Visit: Payer: Medicare HMO

## 2017-09-09 DIAGNOSIS — E039 Hypothyroidism, unspecified: Secondary | ICD-10-CM | POA: Diagnosis not present

## 2017-09-10 LAB — TSH: TSH: 0.078 u[IU]/mL — ABNORMAL LOW (ref 0.450–4.500)

## 2017-09-10 LAB — T4, FREE: FREE T4: 1.27 ng/dL (ref 0.82–1.77)

## 2017-09-13 ENCOUNTER — Ambulatory Visit (INDEPENDENT_AMBULATORY_CARE_PROVIDER_SITE_OTHER): Payer: Medicare HMO | Admitting: "Endocrinology

## 2017-09-13 ENCOUNTER — Encounter: Payer: Self-pay | Admitting: "Endocrinology

## 2017-09-13 VITALS — BP 129/79 | HR 65 | Ht 62.0 in | Wt 114.0 lb

## 2017-09-13 DIAGNOSIS — E039 Hypothyroidism, unspecified: Secondary | ICD-10-CM

## 2017-09-13 MED ORDER — LEVOTHYROXINE SODIUM 88 MCG PO TABS: 88.0000 ug | ORAL_TABLET | Freq: Every day | ORAL | 6 refills | Status: DC

## 2017-09-13 NOTE — Progress Notes (Signed)
Subjective:    Patient ID: Evelyn [name], female    DOB: [birth year], PCP Remus Loffler, PA-C   Past Medical History:  Diagnosis Date  . Aneurysm (HCC)   . Aortic insufficiency   . CAD (coronary artery disease)    CABG LIMA to LAD,vein graft to diagonal,vein graft to OM  . Dyslipidemia   . GERD (gastroesophageal reflux disease)   . Hypothyroid    Past Surgical History:  Procedure Laterality Date  . ABDOMINAL HYSTERECTOMY    . BACK SURGERY  2009  . CORONARY ANGIOPLASTY  2003   cutting balloon atherectomy in-stent stenosis of the LAD.  [name] CORONARY ANGIOPLASTY WITH STENT PLACEMENT  2002   remote PCI & stenting LAD,  . CORONARY ARTERY BYPASS GRAFT  2004   LIMA to LAD,vein graft to diagonal,vein graft to OM  . FOOT SURGERY    . LEFT HEART CATH AND CORS/GRAFTS ANGIOGRAPHY N/A 02/23/2017   Procedure: LEFT HEART CATH AND CORS/GRAFTS ANGIOGRAPHY;  Surgeon: Iran Ouch, MD;  Location: [facility];  Service: Cardiovascular;  Laterality: N/A;  . LEFT HEART CATHETERIZATION WITH CORONARY ANGIOGRAM N/A 12/15/2011   Procedure: LEFT HEART CATHETERIZATION WITH CORONARY ANGIOGRAM;  Surgeon: Thurmon Fair, MD;  Location: [facility];  Service: Cardiovascular;  Laterality: N/A;  . NECK SURGERY    . NM MYOCAR PERF WALL MOTION  12/01/2011   normal study-persistent exercise induced ECG changes raises the concern for ischemia (balanced ischemia).  . NOSE SURGERY    . TONSILLECTOMY  1948  . TUBAL LIGATION     Social History   Socioeconomic History  . Marital status: Married    Spouse name: Not on file  . Number of children: 2  . Years of education: Associates  . Highest education level: Not on file  Occupational History  . Occupation: Retired  Engineer, production  . Financial resource strain: Not on file  . Food insecurity:    Worry: Not on file    Inability: Not on file  . Transportation needs:    Medical: Not on file    Non-medical: Not on file  Tobacco Use  . Smoking  status: Never Smoker  . Smokeless tobacco: Never Used  Substance and Sexual Activity  . Alcohol use: No  . Drug use: No  . Sexual activity: Not on file  Lifestyle  . Physical activity:    Days per week: Not on file    Minutes per session: Not on file  . Stress: Not on file  Relationships  . Social connections:    Talks on phone: Not on file    Gets together: Not on file    Attends religious service: Not on file    Active member of club or organization: Not on file    Attends meetings of clubs or organizations: Not on file    Relationship status: Not on file  Other Topics Concern  . Not on file  Social History Narrative   Mother of 2 with 4 grandchildren   Right-handed   Caffeine: 8-10 cups coffee   Outpatient Encounter Medications as of 09/13/2017  Medication Sig  . ALPRAZolam (XANAX) 0.25 MG tablet Take 1 tablet (0.25 mg total) by mouth 3 (three) times daily as needed for anxiety.  [name] aspirin EC 325 MG tablet Take 325 mg by mouth daily.  . furosemide (LASIX) 20 MG tablet Take 1 tablet (20 mg total) by mouth daily.  Melene Muller ON 09/27/2017] HYDROcodone-acetaminophen (NORCO/VICODIN) 5-325 MG  tablet Take 1 tablet by mouth every 6 (six) hours as needed for moderate pain.  . hydrocortisone 2.5 % cream Apply topically 2 (two) times daily.  . isosorbide mononitrate (IMDUR) 60 MG 24 hr tablet Take 1.5 tablets (90 mg total) by mouth daily.  [name] levothyroxine (SYNTHROID, LEVOTHROID) 88 MCG tablet Take 1 tablet (88 mcg total) by mouth daily before breakfast.  . loratadine (CLARITIN) 10 MG tablet Take 1 tablet (10 mg total) by mouth daily.  . metoprolol tartrate (LOPRESSOR) 100 MG tablet Take 1 tablet (100 mg total) by mouth 2 (two) times daily.  . nitroGLYCERIN (NITROSTAT) 0.4 MG SL tablet PLACE 1 TABLET (0.4 MG TOTAL) UNDER THE TONGUE EVERY 5 (FIVE) MINUTES AS NEEDED FOR CHEST PAIN.  . rosuvastatin (CRESTOR) 40 MG tablet Take 1 tablet (40 mg total) by mouth daily.  . Zoledronic Acid (RECLAST  IV) Inject into the vein. Once yearly  . [DISCONTINUED] HYDROcodone-acetaminophen (NORCO/VICODIN) 5-325 MG tablet Take 1 tablet by mouth every 6 (six) hours as needed for moderate pain.  . [DISCONTINUED] levothyroxine (SYNTHROID, LEVOTHROID) 100 MCG tablet Take 1 tablet (100 mcg total) by mouth daily before breakfast.   No facility-administered encounter medications on file as of 09/13/2017.    ALLERGIES: Allergies  Allergen Reactions  . Sulfa Antibiotics Swelling   VACCINATION STATUS: Immunization History  Administered Date(s) Administered  . Influenza, High Dose Seasonal PF 03/09/2016, 03/02/2017    HPI  77 year old female with medical history as above. She is being seen in follow-up for hypothyroidism and multinodular goiter. She is on levothyroxine 100 mcg p.o. every morning.   - She is status post FNA 3 nodules in her thyroid benign findings. -She is compliant to her medications.  She has no new complaints today. -Her previsit thyroid function tests are consistent with over replacement. - She denies family history of thyroid dysfunction. She feels occasional choking sensation in her neck, no recent voice change nor shortness of breath. She denies any exposure to neck radiation. - She denies any history of smoking.  Review of Systems Constitutional: She has a steady body weight , no fatigue, no subjective hyper or hypothermia.    Eyes: no blurry vision, no xerophthalmia ENT: no sore throat, + nodules palpated in throat,  + intermittent dysphagia/odynophagia, no hoarseness Cardiovascular: no chest pain, no shortness of breath,  No palpitations.   Musculoskeletal: no muscle/joint aches Skin: no rashes Neurological: denies Tremors, tingling. Psychiatric: no depression/anxiety  Objective:    BP 129/79   Pulse 65   Ht  (1.575 m)   Wt 114 lb (51.7 kg)   BMI 20.85 kg/m   Wt Readings from Last 3 Encounters:  09/13/17 114 lb (51.7 kg)  08/01/17 113 lb (51.3 kg)   07/13/17 112 lb 3.2 oz (50.9 kg)    Physical Exam  Constitutional: Not in acute distress, normal state of mind. 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, strength intact in all 4 Skin: moist, warm, no rashes Neurological: - tremor with outstretched hands, DTR normal in all 4  CMP     Component Value Date/Time   NA 141 02/21/2017 1148   K 4.0 02/21/2017 1148   CL 104 02/21/2017 1148   CO2 23 02/21/2017 1148   GLUCOSE 88 02/21/2017 1148   GLUCOSE 99 09/01/2016 0550   BUN 10 02/21/2017 1148   CREATININE 0.59 02/21/2017 1148   CREATININE 0.59 07/11/2014 0913  CALCIUM 9.4 02/21/2017 1148   PROT 6.4 07/21/2016 0935   ALBUMIN 4.1 07/21/2016 0935   AST 12 07/21/2016 0935   ALT 13 07/21/2016 0935   ALKPHOS 55 07/21/2016 0935   BILITOT 0.4 07/21/2016 0935   GFRNONAA 88 02/21/2017 1148   GFRAA 102 02/21/2017 1148   Recent Results (from the past 2160 hour(s))  T4, Free     Status: None   Collection Time: 09/09/17  3:49 PM  Result Value Ref Range   Free T4 1.27 0.82 - 1.77 ng/dL  TSH     Status: Abnormal   Collection Time: 09/09/17  3:49 PM  Result Value Ref Range   TSH 0.078 (L) 0.450 - 4.500 uIU/mL   Fine-needle aspiration samples  of 3 nodules in her thyroid is benign   Assessment & Plan:   1.  hypothyroidism 2. Multinodular goiter  -Her previsit thyroid function tests are still consistent with over replacement.  I discussed and lowered her levothyroxine to 80 mcg p.o. every morning.   - We discussed about correct intake of levothyroxine, at fasting, with water, separated by at least 30 minutes from breakfast, and separated by more than 4 hours from calcium, iron, multivitamins, acid reflux medications (PPIs). -Patient is made aware of the fact that thyroid hormone replacement is needed for life, dose to be adjusted by periodic  monitoring of thyroid function tests.  - She was found to have clinical goiter last visit and one of her complaints was intermittent choking sensation in her neck. She  underwent dedicated thyroid/neck ultrasound which is significant for multinodular goiter. Two nodules on the right (3.3 cm and 1.5 cm); one nodule on the left (2.8 cm) - Biopsy off  3 nodules is negative for malignancy. She will not need intervention at this time. She may need repeat ultrasound 1 year from now.  - I advised patient to maintain close follow up with Remus Loffler, PA-C for primary care needs.  Follow up plan: Return in about 6 months (around 03/15/2018) for follow up with pre-visit labs.  Marquis Lunch, MD Phone: [phone]  Fax: [phone]  -  This note was partially dictated with voice recognition software. Similar sounding words can be transcribed inadequately or may not  be corrected upon review.  09/13/2017, 1:31 PM

## 2017-10-07 ENCOUNTER — Other Ambulatory Visit: Payer: Self-pay | Admitting: Physician Assistant

## 2017-10-07 DIAGNOSIS — L309 Dermatitis, unspecified: Secondary | ICD-10-CM

## 2017-10-13 ENCOUNTER — Other Ambulatory Visit: Payer: Self-pay | Admitting: Neurology

## 2017-10-13 DIAGNOSIS — G459 Transient cerebral ischemic attack, unspecified: Secondary | ICD-10-CM

## 2017-10-13 DIAGNOSIS — E782 Mixed hyperlipidemia: Secondary | ICD-10-CM

## 2017-10-13 MED ORDER — ROSUVASTATIN CALCIUM 20 MG PO TABS: 30.0000 mg | ORAL_TABLET | Freq: Every evening | ORAL | 5 refills | Status: DC

## 2017-10-13 NOTE — Addendum Note (Signed)
Addended by: Bertram Savin on: 10/13/2017 10:33 AM   Modules accepted: Orders

## 2017-10-13 NOTE — Telephone Encounter (Signed)
Called [facility] pharmacy and discussed the patient's Rosuvastatin. RN told that pt has been picking up the 20 mg tablet but has not picked up any 40 mg tablets and she hasn't picked up the 10 mg tablet since May 2018.   Called pt and discussed. She stated that she has been taking the Rosuvastatin 20 mg tablet and she takes 1.5 tablets to total 30 mg. Discussed that it has been a year since she was seen. Patient stated that she has "has not had any problems" and as much as she liked Dr. Lucia Gaskins, she does not want to add anymore doctors back in. She would like to stick with her PCP and Cardiology. Dr. Lucia Gaskins has offered to refill her Rosuvastatin for 6 months until she can get refills from PCP or cardiology. Pt verbalized appreciation.   Rosuvastatin 40 mg d/c as pt not taking it. Refilled Rosuvastatin 20 mg and changed instructions to take 1.5 tablets every evening, #45, refills 5.

## 2017-10-14 ENCOUNTER — Encounter: Payer: Self-pay | Admitting: Cardiovascular Disease

## 2017-10-14 ENCOUNTER — Ambulatory Visit: Payer: Medicare HMO | Admitting: Cardiovascular Disease

## 2017-10-14 VITALS — BP 122/68 | HR 60 | Ht 62.0 in | Wt 114.0 lb

## 2017-10-14 DIAGNOSIS — I712 Thoracic aortic aneurysm, without rupture: Secondary | ICD-10-CM

## 2017-10-14 DIAGNOSIS — I25718 Atherosclerosis of autologous vein coronary artery bypass graft(s) with other forms of angina pectoris: Secondary | ICD-10-CM | POA: Diagnosis not present

## 2017-10-14 DIAGNOSIS — I10 Essential (primary) hypertension: Secondary | ICD-10-CM

## 2017-10-14 DIAGNOSIS — I7121 Aneurysm of the ascending aorta, without rupture: Secondary | ICD-10-CM

## 2017-10-14 DIAGNOSIS — E785 Hyperlipidemia, unspecified: Secondary | ICD-10-CM

## 2017-10-14 DIAGNOSIS — I351 Nonrheumatic aortic (valve) insufficiency: Secondary | ICD-10-CM

## 2017-10-14 NOTE — Progress Notes (Signed)
Cardiology Office Note    Date:  10/16/2017   ID:  BLEU MOISAN, DOB 09/06/1940, MRN [id number]  PCP:  Remus Loffler, PA-C  Cardiologist:   Thurmon Fair, MD   Chief Complaint  Patient presents with  . Follow-up    3-4 months  . Headache  . Shortness of Breath  . Chest Pain    History of Present Illness:  Evelyn [name] is a 77 y.o. female with long-standing history of coronary artery disease with previous stents to LAD followed by bypass surgery (LIMA to LAD, SVG to diagonal, SVG to OM in 2004), hyperlipidemia here for follow-up.  Increased dose of long-acting nitrates at her last appointment has led to improvement in her angina.  She has had one episode of chest discomfort that woke her from sleep about 2 weeks ago and resolved spontaneously in about 20 minutes, without nitroglycerin.  She does not have exertional dyspnea or angina.  She does get short of breath bending over.  She has occasional orthostatic dizziness, only being on a couple of occasions.  Denies leg edema, orthopnea, PND, palpitations, focal neurological events, leg edema or claudication.  Angiography in October 2018 showed virtually no change in coronary anatomy since 2013.  She has a chronically occluded LAD artery with multiple occluded stents, but has good downstream blood flow via LIMA-LAD and SVG-diagonal bypasses.  There is probably a significant area of myocardial septum that is ischemic, upstream of the blocked mid LAD.  There was no mention of a SVG bypass to the oblique marginal branch, but the left circumflex coronary artery was widely patent with no more than 20% stenosis (Chest CTA showed that the SVG-OM is now occluded).  Left ventricular regional wall motion and overall systolic function remain normal and the LVEDP was only 14 mmHg. Although the chest CT angiogram suggested there was a 70% stenosis in the left main coronary artery, that potentially could lead to ischemia in the circumflex territory,  this was not found to be the case by invasive angiography.  Had an echocardiogram in May 2018, showing normal LVEF 55-60%, with inferior and inferoseptal mild hypokinesis, mildly dilated ascending aorta, mild aortic insufficiency. In September 2017 her treadmill nuclear stress test showed normal myocardial perfusion and EF 62%. In October 2017 she had the last evaluation of her aorta, showing mild aneurysmal dilation of the ascending aorta at 3.7 cm and of the aortic arch at 3.3 cm. (She is a very small frame lady, 5 foot 2 inches tall, 112 pounds).  CT angiography of the aorta October 2018 showed unchanged aortic root dimension at 3.3 cm.  The SVG to OM was described as occluded.  She has long-standing history of coronary disease, undergoing her first cardiac catheterization in 1999. She received stents to the LAD artery in 2002 and had to have cutting balloon atherectomy for in-stent restenosis in 2003 followed by a new LAD stent in 2004 for disease progression. Ultimately after again developing restenosis she underwent coronary bypass surgery (LIMA to LAD, SVG to diagonal, SVG to OM in 2004). She had cardiac catheterization in July 2013 and October 2018, without evidence of significant progression. This demonstrated interval total occlusion of the proximal LAD artery although the distant bypasses are all widely patent. On her stress test in 2013 and 2017  the perfusion images were "normal". It is felt that she may have occasional angina pectoris related to ischemia in the proximal septum since there is no retrograde flow from the bypasses  to the proximal LAD. Her last echo in May 2018 showed normal EF 55-60%, but reported inferior and inferoseptal mild hypokinesis. 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  CT angiogram of the head and neck performed in May 2018 for TIA showed mild extracranial atherosclerosis. She has treated systemic hypertension and  hyperlipidemia and hypothyroidism.  She had previous cervical spine surgery with bone grafting and discectomy in 2009.   Past Medical History:  Diagnosis Date  . Aneurysm (HCC)   . Aortic insufficiency   . CAD (coronary artery disease)    CABG LIMA to LAD,vein graft to diagonal,vein graft to OM  . Dyslipidemia   . GERD (gastroesophageal reflux disease)   . Hypothyroid     Past Surgical History:  Procedure Laterality Date  . ABDOMINAL HYSTERECTOMY    . BACK SURGERY  2009  . CORONARY ANGIOPLASTY  2003   cutting balloon atherectomy in-stent stenosis of the LAD.  [name] CORONARY ANGIOPLASTY WITH STENT PLACEMENT  2002   remote PCI & stenting LAD,  . CORONARY ARTERY BYPASS GRAFT  2004   LIMA to LAD,vein graft to diagonal,vein graft to OM  . FOOT SURGERY    . LEFT HEART CATH AND CORS/GRAFTS ANGIOGRAPHY N/A 02/23/2017   Procedure: LEFT HEART CATH AND CORS/GRAFTS ANGIOGRAPHY;  Surgeon: Iran Ouch, MD;  Location: [facility];  Service: Cardiovascular;  Laterality: N/A;  . LEFT HEART CATHETERIZATION WITH CORONARY ANGIOGRAM N/A 12/15/2011   Procedure: LEFT HEART CATHETERIZATION WITH CORONARY ANGIOGRAM;  Surgeon: Thurmon Fair, MD;  Location: [facility];  Service: Cardiovascular;  Laterality: N/A;  . NECK SURGERY    . NM MYOCAR PERF WALL MOTION  12/01/2011   normal study-persistent exercise induced ECG changes raises the concern for ischemia (balanced ischemia).  . NOSE SURGERY    . TONSILLECTOMY  1948  . TUBAL LIGATION      Current Medications: Outpatient Medications Prior to Visit  Medication Sig Dispense Refill  . ALPRAZolam (XANAX) 0.25 MG tablet Take 1 tablet (0.25 mg total) by mouth 3 (three) times daily as needed for anxiety. 30 tablet 5  . aspirin EC 81 MG tablet Take 81 mg by mouth daily.     . furosemide (LASIX) 20 MG tablet Take 1 tablet (20 mg total) by mouth daily. 90 tablet 3  . HYDROcodone-acetaminophen (NORCO/VICODIN) 5-325 MG tablet Take 1 tablet by mouth  every 6 (six) hours as needed for moderate pain. 120 tablet 0  . hydrocortisone 2.5 % cream APPLY TO AFFECTED AREA TWICE A DAY 28.35 g 2  . isosorbide mononitrate (IMDUR) 60 MG 24 hr tablet Take 1.5 tablets (90 mg total) by mouth daily. 90 tablet 3  . levothyroxine (SYNTHROID, LEVOTHROID) 88 MCG tablet Take 1 tablet (88 mcg total) by mouth daily before breakfast. 30 tablet 6  . loratadine (CLARITIN) 10 MG tablet Take 1 tablet (10 mg total) by mouth daily. 30 tablet 11  . metoprolol tartrate (LOPRESSOR) 100 MG tablet Take 1 tablet (100 mg total) by mouth 2 (two) times daily. 60 tablet 11  . nitroGLYCERIN (NITROSTAT) 0.4 MG SL tablet PLACE 1 TABLET (0.4 MG TOTAL) UNDER THE TONGUE EVERY 5 (FIVE) MINUTES AS NEEDED FOR CHEST PAIN. 25 tablet 0  . rosuvastatin (CRESTOR) 20 MG tablet Take 1.5 tablets (30 mg total) by mouth every evening. Please have either primary care or cardiologist provide future refills. 45 tablet 5  . Zoledronic Acid (RECLAST IV) Inject into the  vein. Once yearly     No facility-administered medications prior to visit.      Allergies:   Sulfa antibiotics   Social History   Socioeconomic History  . Marital status: Married    Spouse name: Not on file  . Number of children: 2  . Years of education: Associates  . Highest education level: Not on file  Occupational History  . Occupation: Retired  Engineer, productionocial Needs  . Financial resource strain: Not on file  . Food insecurity:    Worry: Not on file    Inability: Not on file  . Transportation needs:    Medical: Not on file    Non-medical: Not on file  Tobacco Use  . Smoking status: Never Smoker  . Smokeless tobacco: Never Used  Substance and Sexual Activity  . Alcohol use: No  . Drug use: No  . Sexual activity: Not on file  Lifestyle  . Physical activity:    Days per week: Not on file    Minutes per session: Not on file  . Stress: Not on file  Relationships  . Social connections:    Talks on phone: Not on file    Gets  together: Not on file    Attends religious service: Not on file    Active member of club or organization: Not on file    Attends meetings of clubs or organizations: Not on file    Relationship status: Not on file  Other Topics Concern  . Not on file  Social History Narrative   Mother of 2 with 4 grandchildren   Right-handed   Caffeine: 8-10 cups coffee     Family History:  The patient's family history includes Asthma in her maternal grandmother; Heart attack in her mother; Heart failure in her father; Hypertension in her father.   ROS:   Please see the history of present illness.    ROS All other systems reviewed and are negative.   PHYSICAL EXAM:   VS:  BP 122/68 (BP Location: Left Arm, Patient Position: Sitting, Cuff Size: Normal)   Pulse 60   Ht 5\' 2"  (1.575 m)   Wt 114 lb (51.7 kg)   BMI 20.85 kg/m      General: Alert, oriented x3, no distress, very lean and appears fit Head: no evidence of trauma, PERRL, EOMI, no exophtalmos or lid lag, no myxedema, no xanthelasma; normal ears, nose and oropharynx Neck: normal jugular venous pulsations and no hepatojugular reflux; brisk carotid pulses without delay and no carotid bruits Chest: clear to auscultation, no signs of consolidation by percussion or palpation, normal fremitus, symmetrical and full respiratory excursions Cardiovascular: normal position and quality of the apical impulse, regular rhythm, normal first and second heart sounds, no murmurs, rubs or gallops Abdomen: no tenderness or distention, no masses by palpation, no abnormal pulsatility or arterial bruits, normal bowel sounds, no hepatosplenomegaly Extremities: no clubbing, cyanosis or edema; 2+ radial, ulnar and brachial pulses bilaterally; 2+ right femoral, posterior tibial and dorsalis pedis pulses; 2+ left femoral, posterior tibial and dorsalis pedis pulses; no subclavian or femoral bruits Neurological: grossly nonfocal Psych: Normal mood and affect   Wt  Readings from Last 3 Encounters:  10/14/17 114 lb (51.7 kg)  09/13/17 114 lb (51.7 kg)  08/01/17 113 lb (51.3 kg)      Studies/Labs Reviewed:   EKG:  EKG is ordered today.  It shows normal sinus rhythm with nonspecific T wave changes and QTC 412 ms Recent Labs: 02/21/2017: BUN 10; Creatinine,  Ser 0.59; Hemoglobin 13.5; Platelets 260; Potassium 4.0; Sodium 141 09/09/2017: TSH 0.078   Lipid Panel    Component Value Date/Time   CHOL 157 07/21/2016 0935   TRIG 76 07/21/2016 0935   HDL 56 07/21/2016 0935   CHOLHDL 2.8 07/21/2016 0935   CHOLHDL 2.0 07/11/2014 0913   VLDL 12 07/11/2014 0913   LDLCALC 86 07/21/2016 0935     ASSESSMENT:    1. 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 Ascending aortic aneurysm (HCC)   3. Essential hypertension   4. Dyslipidemia   5. Nonrheumatic aortic valve insufficiency      PLAN:  In order of problems listed above:  1. CAD s/p CABG: Now with well-controlled angina pectoris, CCS class 1-2; probably has proximal septal ischemia.  Angina has improved substantially with long-acting nitrates, but does still occur occasionally.  She is on 2 antianginal medications.  Since her symptoms more recently have been nocturnal, cannot exclude a component of coronary vasospasm, but the episodes are rare and is not worth adding a calcium channel blocker at this time. 2. Asc Ao Aneurysm: Very mild at 3.3 cm, stable in size.  Unchanged on numerous serial studies, will reevaluate only if she develops symptoms. 3. HTN: Very well controlled 4. HLP: On maximum dose rosuvastatin.  LDL is not at target less than 70, but is much much better than her baseline, well over 50% reduction.  There has been no progression in native coronary artery disease over the last 5 years.  She is reluctant to take on the cost of ezetimibe.  Will continue same medications. 5. AI: I cannot hear this on exam and is not likely to be  hemodynamically significant.  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   Medication Adjustments/Labs and Tests Ordered: Current medicines are reviewed at length with the patient today.  Concerns regarding medicines are outlined above.  Medication changes, Labs and Tests ordered today are listed in the Patient Instructions below. Patient Instructions  Dr Royann Shivers recommends that you schedule a follow-up appointment in 12 months. You will receive a reminder letter in the mail two months in advance. If you don't receive a letter, please call our office to schedule the follow-up appointment.  If you need a refill on your cardiac medications before your next appointment, please call your pharmacy.    Signed, Thurmon Fair, MD  10/16/2017 3:30 PM    [facility] [address] [place], [place], [place]  [address] Phone: [phone]; Fax: [phone]

## 2017-10-14 NOTE — Patient Instructions (Signed)
Dr Croitoru recommends that you schedule a follow-up appointment in 12 months. You will receive a reminder letter in the mail two months in advance. If you don't receive a letter, please call our office to schedule the follow-up appointment.  If you need a refill on your cardiac medications before your next appointment, please call your pharmacy. 

## 2017-10-16 ENCOUNTER — Encounter: Payer: Self-pay | Admitting: Cardiovascular Disease

## 2017-11-15 ENCOUNTER — Encounter: Payer: Self-pay | Admitting: Physician Assistant

## 2017-11-15 ENCOUNTER — Ambulatory Visit: Payer: Medicare HMO | Admitting: Physician Assistant

## 2017-11-15 VITALS — BP 95/64 | HR 65 | Temp 96.8°F | Ht 62.0 in | Wt 114.2 lb

## 2017-11-15 DIAGNOSIS — I10 Essential (primary) hypertension: Secondary | ICD-10-CM

## 2017-11-15 DIAGNOSIS — S22080D Wedge compression fracture of T11-T12 vertebra, subsequent encounter for fracture with routine healing: Secondary | ICD-10-CM

## 2017-11-15 MED ORDER — HYDROCODONE-ACETAMINOPHEN 5-325 MG PO TABS: 1.0000 | ORAL_TABLET | Freq: Four times a day (QID) | ORAL | 0 refills | Status: DC | PRN

## 2017-11-16 NOTE — Progress Notes (Signed)
BP 95/64   Pulse 65   Temp (!) 96.8 F (36 C) (Oral)   Ht 5\' 2"  (1.575 m)   Wt 114 lb 3.2 oz (51.8 kg)   BMI 20.89 kg/m    Subjective:    Patient ID: Evelyn [name], female    DOB: [birth year], 77 y.o.   MRN: [id number]  HPI: Evelyn [name] is a 77 y.o. 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  She does have known heart disease and is followed by cardiology.  At this time everything is doing well she has no complaints.  Her medications do need to be refilled..   All medications are reviewed today. There are no reports of any problems with the medications. All of the medical conditions are reviewed and updated.  Lab work is reviewed and will be ordered as medically necessary. There are no new problems reported with today's visit.   Past Medical History:  Diagnosis Date  . Aneurysm (HCC)   . Aortic insufficiency   . CAD (coronary artery disease)    CABG LIMA to LAD,vein graft to diagonal,vein graft to OM  . Dyslipidemia   . GERD (gastroesophageal reflux disease)   . Hypothyroid    Relevant past medical, surgical, family and social history reviewed and updated as indicated. Interim medical history since our last visit reviewed. Allergies and medications reviewed and updated. DATA REVIEWED: CHART IN EPIC  Family History reviewed for pertinent findings.  Review of Systems  Constitutional: Negative.   HENT: Negative.   Eyes: Negative.   Respiratory: Negative.   Gastrointestinal: Negative.   Genitourinary: Negative.   Musculoskeletal: Positive for arthralgias and back pain.    Allergies as of 11/15/2017      Reactions   Sulfa Antibiotics Swelling      Medication List        Accurate as of 11/15/17 11:59 PM. Always use your most recent med list.          ALPRAZolam  0.25 MG tablet Commonly known as:  XANAX Take 1 tablet (0.25 mg total) by mouth 3 (three) times daily as needed for anxiety.   aspirin 325 MG tablet Take 325 mg by mouth daily.   furosemide 20 MG tablet Commonly known as:  LASIX Take 1 tablet (20 mg total) by mouth daily.   HYDROcodone-acetaminophen 5-325 MG tablet Commonly known as:  NORCO Take 1 tablet by mouth every 6 (six) hours as needed for moderate pain.   HYDROcodone-acetaminophen 5-325 MG tablet Commonly known as:  NORCO Take 1 tablet by mouth every 6 (six) hours as needed for moderate pain.   HYDROcodone-acetaminophen 5-325 MG tablet Commonly known as:  NORCO/VICODIN Take 1 tablet by mouth every 6 (six) hours as needed for moderate pain.   hydrocortisone 2.5 % cream APPLY TO AFFECTED AREA TWICE A DAY   isosorbide mononitrate 60 MG 24 hr tablet Commonly known as:  IMDUR Take 1.5 tablets (90 mg total) by mouth daily.   levothyroxine 88 MCG tablet Commonly known as:  SYNTHROID, LEVOTHROID Take 1 tablet (88 mcg total) by mouth daily before breakfast.   loratadine 10 MG tablet Commonly known as:  CLARITIN Take 1 tablet (10 mg total) by mouth daily.   metoprolol tartrate 100 MG tablet Commonly known as:  LOPRESSOR Take 1 tablet (100 mg total) by mouth 2 (  two) times daily.   nitroGLYCERIN 0.4 MG SL tablet Commonly known as:  NITROSTAT PLACE 1 TABLET (0.4 MG TOTAL) UNDER THE TONGUE EVERY 5 (FIVE) MINUTES AS NEEDED FOR CHEST PAIN.   RECLAST IV Inject into the vein. Once yearly   rosuvastatin 20 MG tablet Commonly known as:  CRESTOR Take 1.5 tablets (30 mg total) by mouth every evening. Please have either primary care or cardiologist provide future refills.          Objective:    BP 95/64   Pulse 65   Temp (!) 96.8 F (36 C) (Oral)   Ht 5\' 2"  (1.575 m)   Wt 114 lb 3.2 oz (51.8 kg)   BMI 20.89 kg/m   Allergies  Allergen Reactions  . Sulfa Antibiotics Swelling    Wt Readings from Last 3  Encounters:  11/15/17 114 lb 3.2 oz (51.8 kg)  10/14/17 114 lb (51.7 kg)  09/13/17 114 lb (51.7 kg)    Physical Exam  Constitutional: She is oriented to person, place, and time. She appears well-developed and well-nourished.  HENT:  Head: Normocephalic and atraumatic.  Eyes: Pupils are equal, round, and reactive to light. Conjunctivae and EOM are normal.  Cardiovascular: Normal rate, regular rhythm, normal heart sounds and intact distal pulses.  Pulmonary/Chest: Effort normal and breath sounds normal.  Abdominal: Soft. Bowel sounds are normal.  Neurological: She is alert and oriented to person, place, and time. She has normal reflexes.  Skin: Skin is warm and dry. No rash noted.  Psychiatric: She has a normal mood and affect. Her behavior is normal. Judgment and thought content normal.    Results for orders placed or performed in visit on 09/07/17  T4, Free  Result Value Ref Range   Free T4 1.27 0.82 - 1.77 ng/dL  TSH  Result Value Ref Range   TSH 0.078 (L) 0.450 - 4.500 uIU/mL      Assessment & Plan:   1. Closed wedge compression fracture of eleventh thoracic vertebra with routine healing, subsequent encounter - HYDROcodone-acetaminophen (NORCO) 5-325 MG tablet; Take 1 tablet by mouth every 6 (six) hours as needed for moderate pain.  Dispense: 120 tablet; Refill: 0 - HYDROcodone-acetaminophen (NORCO) 5-325 MG tablet; Take 1 tablet by mouth every 6 (six) hours as needed for moderate pain.  Dispense: 120 tablet; Refill: 0 - HYDROcodone-acetaminophen (NORCO/VICODIN) 5-325 MG tablet; Take 1 tablet by mouth every 6 (six) hours as needed for moderate pain.  Dispense: 120 tablet; Refill: 0  2. Essential hypertension Continue medications per cardiology   Continue all other maintenance medications as listed above.  Follow up plan: Return in about 3 months (around 02/15/2018) for recheck.  Educational handout given for survey  Remus Loffler PA-C [facility] [address]  [place], [place] [address] [phone]   11/16/2017, 4:49 PM

## 2017-12-12 ENCOUNTER — Ambulatory Visit: Payer: Medicare HMO | Admitting: Nurse Practitioner

## 2017-12-12 ENCOUNTER — Encounter: Payer: Self-pay | Admitting: Nurse Practitioner

## 2017-12-12 VITALS — BP 105/64 | HR 60 | Temp 97.0°F | Ht 62.0 in | Wt 115.6 lb

## 2017-12-12 DIAGNOSIS — L03114 Cellulitis of left upper limb: Secondary | ICD-10-CM | POA: Diagnosis not present

## 2017-12-12 MED ORDER — DOXYCYCLINE HYCLATE 100 MG PO TABS: 100.0000 mg | ORAL_TABLET | Freq: Two times a day (BID) | ORAL | 0 refills | Status: DC

## 2017-12-12 NOTE — Progress Notes (Signed)
   Subjective:    Patient ID: Evelyn LacyShirley H Delapena, female    DOB: [birth year], 77 y.o.   MRN: [id number]   Chief Complaint: L arm redness (started on Friday, pain with itching)   HPI Patient comes in c/o left arm pain and swelling. Started Friday and has spread.   Review of Systems  Constitutional: Negative.  Negative for chills and fever.  Respiratory: Negative.   Cardiovascular: Negative.   Gastrointestinal: Negative.   Genitourinary: Negative.   Skin:       Left arm swelling and erythema  Neurological: Negative.   Psychiatric/Behavioral: Negative.   All other systems reviewed and are negative.      Objective:   Physical Exam  Constitutional: She is oriented to person, place, and time. She appears well-developed and well-nourished. No distress.  Cardiovascular: Normal rate and regular rhythm.  Pulmonary/Chest: Effort normal.  Neurological: She is alert and oriented to person, place, and time.  Skin: Skin is warm.  Left forearm erythematous and edematous and hot to touch.  Psychiatric: She has a normal mood and affect. Her behavior is normal.   BP 105/64 (BP Location: Left Arm, Patient Position: Sitting, Cuff Size: Normal)   Pulse 60   Temp (!) 97 F (36.1 C)   Ht 5\' 2"  (1.575 m)   Wt 115 lb 9.6 oz (52.4 kg)   BMI 21.14 kg/m            Assessment & Plan:  Evelyn LacyShirley H Angus in today with chief complaint of L arm redness (started on Friday, pain with itching)   1. Cellulitis of left forearm Meds ordered this encounter  Medications  . doxycycline (VIBRA-TABS) 100 MG tablet    Sig: Take 1 tablet (100 mg total) by mouth 2 (two) times daily. 1 po bid    Dispense:  20 tablet    Refill:  0    Order Specific Question:   Supervising Provider    Answer:   Johna SheriffVINCENT, CAROL L [[id number]]   Cool compresses RTO if no improvemnet over the next 24 hours  Mary-Margaret Daphine DeutscherMartin, FNP

## 2017-12-12 NOTE — Patient Instructions (Signed)

## 2017-12-13 ENCOUNTER — Telehealth: Payer: Self-pay | Admitting: Physician Assistant

## 2017-12-13 ENCOUNTER — Ambulatory Visit: Payer: Medicare HMO | Admitting: Family Medicine

## 2017-12-15 ENCOUNTER — Ambulatory Visit: Payer: Medicare HMO

## 2018-01-11 ENCOUNTER — Encounter: Payer: Self-pay | Admitting: *Deleted

## 2018-01-11 ENCOUNTER — Ambulatory Visit (INDEPENDENT_AMBULATORY_CARE_PROVIDER_SITE_OTHER): Payer: Medicare HMO | Admitting: *Deleted

## 2018-01-11 VITALS — BP 110/63 | HR 60 | Ht 59.75 in | Wt 115.0 lb

## 2018-01-11 DIAGNOSIS — Z23 Encounter for immunization: Secondary | ICD-10-CM | POA: Diagnosis not present

## 2018-01-11 DIAGNOSIS — Z0000 Encounter for general adult medical examination without abnormal findings: Secondary | ICD-10-CM

## 2018-01-11 DIAGNOSIS — H9192 Unspecified hearing loss, left ear: Secondary | ICD-10-CM

## 2018-01-11 NOTE — Progress Notes (Addendum)
Subjective:   Evelyn [name] is a 77 y.o. female who presents for a Medicare Annual Wellness Visit. Talbert ForestShirley lives at home alone since her husband passed away 2 years ago from a stroke. She has her husband's miniature dachshund. She has two adult children. One son and one daughter and multiple grandchildren. She has a female friend that she often spends time with. 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 She walks some everyday tries to incorporate it into her daily life and routine. Enjoys playing Bingo a couple times a week and reading.    Review of Systems    Patient reports that her overall health is unchanged compared to last year.  Cardiac Risk Factors include: advanced age (>9855men, 79>65 women);dyslipidemia;family history of premature cardiovascular disease(extensive heart disease). Has an extensive cardiac surgery with stent placement.  All other systems negative       Current Medications (verified) Outpatient Encounter Medications as of 01/11/2018  Medication Sig  . ALPRAZolam (XANAX) 0.25 MG tablet Take 1 tablet (0.25 mg total) by mouth 3 (three) times daily as needed for anxiety.  [name]. aspirin 325 MG tablet Take 325 mg by mouth daily.  . furosemide (LASIX) 20 MG tablet Take 1 tablet (20 mg total) by mouth daily.  [name]. HYDROcodone-acetaminophen (NORCO) 5-325 MG tablet Take 1 tablet by mouth every 6 (six) hours as needed for moderate pain.  [name]. HYDROcodone-acetaminophen (NORCO) 5-325 MG tablet Take 1 tablet by mouth every 6 (six) hours as needed for moderate pain.  [name]. HYDROcodone-acetaminophen (NORCO/VICODIN) 5-325 MG tablet Take 1 tablet by mouth every 6 (six) hours as needed for moderate pain.  . hydrocortisone 2.5 % cream APPLY TO AFFECTED AREA TWICE A DAY  . isosorbide mononitrate (IMDUR) 60 MG 24 hr tablet Take 1.5 tablets (90 mg total) by mouth daily.  [name]. levothyroxine (SYNTHROID, LEVOTHROID) 88 MCG tablet Take 1  tablet (88 mcg total) by mouth daily before breakfast.  . loratadine (CLARITIN) 10 MG tablet Take 1 tablet (10 mg total) by mouth daily.  . metoprolol tartrate (LOPRESSOR) 100 MG tablet Take 1 tablet (100 mg total) by mouth 2 (two) times daily.  . nitroGLYCERIN (NITROSTAT) 0.4 MG SL tablet PLACE 1 TABLET (0.4 MG TOTAL) UNDER THE TONGUE EVERY 5 (FIVE) MINUTES AS NEEDED FOR CHEST PAIN.  . rosuvastatin (CRESTOR) 20 MG tablet Take 1.5 tablets (30 mg total) by mouth every evening. Please have either primary care or cardiologist provide future refills.  . Zoledronic Acid (RECLAST IV) Inject into the vein. Once yearly  . doxycycline (VIBRA-TABS) 100 MG tablet Take 1 tablet (100 mg total) by mouth 2 (two) times daily. 1 po bid   No facility-administered encounter medications on file as of 01/11/2018.     Allergies (verified) Sulfa antibiotics   History: Past Medical History:  Diagnosis Date  . Aneurysm (HCC)   . Aortic insufficiency   . CAD (coronary artery disease)    CABG LIMA to LAD,vein graft to diagonal,vein graft to OM  . Dyslipidemia   . GERD (gastroesophageal reflux disease)   . Hypothyroid    Past Surgical History:  Procedure Laterality Date  . ABDOMINAL HYSTERECTOMY    . BACK SURGERY  2009  . CORONARY ANGIOPLASTY  2003   cutting balloon atherectomy in-stent stenosis of the LAD.  [name]. CORONARY ANGIOPLASTY WITH STENT PLACEMENT  2002   remote PCI & stenting LAD,  . CORONARY ARTERY BYPASS GRAFT  2004   LIMA to LAD,vein graft to diagonal,vein graft to OM  . FOOT SURGERY    . LEFT HEART CATH AND CORS/GRAFTS ANGIOGRAPHY N/A 02/23/2017   Procedure: LEFT HEART CATH AND CORS/GRAFTS ANGIOGRAPHY;  Surgeon: Iran Ouch, MD;  Location: [facility] INVASIVE CV LAB;  Service: Cardiovascular;  Laterality: N/A;  . LEFT HEART CATHETERIZATION WITH CORONARY ANGIOGRAM N/A 12/15/2011   Procedure: LEFT HEART CATHETERIZATION WITH CORONARY ANGIOGRAM;  Surgeon: Thurmon Fair, MD;  Location: [facility] CATH LAB;   Service: Cardiovascular;  Laterality: N/A;  . NECK SURGERY    . NM MYOCAR PERF WALL MOTION  12/01/2011   normal study-persistent exercise induced ECG changes raises the concern for ischemia (balanced ischemia).  . NOSE SURGERY    . TONSILLECTOMY  1948  . TUBAL LIGATION     Family History  Problem Relation Age of Onset  . Heart attack Mother   . Heart failure Father   . Hypertension Father   . Asthma Maternal Grandmother   . Stroke Brother   . Voice disorder Daughter        vocal dystonia  . Stroke Daughter   . Cancer Sister 63   Social History   Socioeconomic History  . Marital status: Married    Spouse name: Not on file  . Number of children: 2  . Years of education: Associates  . Highest education level: Associate degree: academic program  Occupational History  . Occupation: Retired  Engineer, production  . Financial resource strain: Not hard at all  . Food insecurity:    Worry: Never true    Inability: Never true  . Transportation needs:    Medical: No    Non-medical: No  Tobacco Use  . Smoking status: Never Smoker  . Smokeless tobacco: Never Used  Substance and Sexual Activity  . Alcohol use: No  . Drug use: No  . Sexual activity: Not Currently  Lifestyle  . Physical activity:    Days per week: 7 days    Minutes per session: 30 min  . Stress: Not at all  Relationships  . Social connections:    Talks on phone: More than three times a week    Gets together: More than three times a week    Attends religious service: More than 4 times per year    Active member of club or organization: Yes    Attends meetings of clubs or organizations: More than 4 times per year    Relationship status: Widowed  Other Topics Concern  . Not on file  Social History Narrative   Mother of 2 with 4 grandchildren   Right-handed   Caffeine: 8-10 cups coffee    Tobacco Use No.  Clinical Intake:  Pre-visit preparation completed: No  Pain : No/denies pain     Nutritional  Status: BMI of 19-24  Normal Diabetes: No  How often do you need to have someone help you when you read instructions, pamphlets, or other written materials from your doctor or pharmacy?: 1 - Never What is the last grade level you completed in school?: Associates degree  Interpreter Needed?: No  Information entered by :: Demetrios Loll, RN   Activities of Daily Living In your present state of health, do you have any difficulty performing the following activities: 01/11/2018 02/23/2017  Hearing? Y N  Comment her friend has noticed that she has some trouble with hearing at times and she is considering an evaluation -  Vision? N N  Comment has cataracts but  they have been monitoring them regularly.  -  Difficulty concentrating or making decisions? N N  Walking or climbing stairs? N N  Dressing or bathing? N N  Doing errands, shopping? N -  Preparing Food and eating ? N -  Using the Toilet? N -  In the past six months, have you accidently leaked urine? N -  Do you have problems with loss of bowel control? N -  Managing your Medications? N -  Managing your Finances? N -  Housekeeping or managing your Housekeeping? N -  Some recent data might be hidden    Diet Eats when hungry. Doesn't necessarily have set meal times.  She does enjoy baking but doesn't cook a lot. She does eat out often. She really likes hotdogs. Drinks a lot of coffee She is working on drinking more water  Exercise Current Exercise Habits: Home exercise routine, Time (Minutes): 30, Frequency (Times/Week): 7, Weekly Exercise (Minutes/Week): 210, Intensity: Mild, Exercise limited by: orthopedic condition(s)   Depression Screen PHQ 2/9 Scores 01/11/2018 11/15/2017 08/01/2017 06/16/2017 03/02/2017 01/25/2017 11/23/2016  PHQ - 2 Score 0 0 0 0 0 0 0     Fall Risk Fall Risk  01/11/2018 11/15/2017 08/01/2017 06/16/2017 03/02/2017  Falls in the past year? No No No No No  Number falls in past yr: - - - - -  Injury with Fall? - - -  - -    Safety Is the patient's home free of loose throw rugs in walkways, pet beds, electrical cords, etc?   yes      Grab bars in the bathroom? yes      Walkin shower? no      Shower Seat? no      Handrails on the stairs?   yes      Adequate lighting?   yes  Patient Care Team: Caryl Never as PCP - General (Physician Assistant)  Hospitalizations, surgeries, and ER visits in previous 12 months No hospitalizations, ER visits, or surgeries this past year.   Objective:    Today's Vitals   01/11/18 1018  BP: 110/63  Pulse: 60  Weight: 115 lb (52.2 kg)  Height: 4' 11.75" (1.518 m)   Body mass index is 22.65 kg/m.  Advanced Directives 01/11/2018 02/23/2017 09/01/2016 12/15/2011  Does Patient Have a Medical Advance Directive? Yes No Yes Patient does not have advance directive  Type of Advance Directive Living will - Living will -  Does patient want to make changes to medical advance directive? Yes (MAU/Ambulatory/Procedural Areas - Information given) - No - Patient declined -  Copy of Healthcare Power of Attorney in Chart? No - copy requested - - -  Would patient like information on creating a medical advance directive? - No - Patient declined - -    Hearing/Vision  No hearing or vision deficits noted during visit.  Cognitive Function: MMSE - Mini Mental State Exam 01/11/2018  Orientation to time 5  Orientation to Place 5  Registration 3  Attention/ Calculation 5  Recall 2  Language- name 2 objects 2  Language- repeat 1  Language- follow 3 step command 3  Language- read & follow direction 1  Write a sentence 1  Copy design 1  Total score 29       Normal Cognitive Function Screening: Yes    Immunizations and Health Maintenance Immunization History  Administered Date(s) Administered  . Influenza, High Dose Seasonal PF 03/09/2016, 03/02/2017  . Pneumococcal Conjugate-13 01/11/2018   Health Maintenance Due  Topic Date Due  . INFLUENZA VACCINE  12/15/2017     Health Maintenance  Topic Date Due  . INFLUENZA VACCINE  12/15/2017  . PNA vac Low Risk Adult (2 of 2 - PPSV23) 01/12/2019  . TETANUS/TDAP  01/12/2023  . DEXA SCAN  Completed        Assessment:   This is a routine wellness examination for Evelyn [name].    Plan:    Goals    . Exercise 150 min/wk Moderate Activity       Additional Screening Recommendations: Lung: Low Dose CT Chest recommended if Age 71-80 years, 30 pack-year currently smoking OR have quit w/in 15years. Patient does not qualify. Hepatitis C Screening recommended: no  Today's Orders Orders Placed This Encounter  Procedures  . Pneumococcal conjugate vaccine 13-valent  . Ambulatory referral to Audiology    Referral Priority:   Routine    Referral Type:   Audiology Exam    Referral Reason:   Specialty Services Required    Number of Visits Requested:   1    Keep f/u with Remus Loffler, PA-C and any other specialty appointments you may have Continue current medications Move carefully to avoid falls. Use assistive devices like a cane or walker if needed. Aim for at least 150 minutes of moderate activity a week. This can be done with chair exercises if necessary. Read or work on puzzles daily Stay connected with friends and family  I have personally reviewed and noted the following in the patient's chart:   . Medical and social history . Use of alcohol, tobacco or illicit drugs  . Current medications and supplements . Functional ability and status . Nutritional status . Physical activity . Advanced directives . List of other physicians . Hospitalizations, surgeries, and ER visits in previous 12 months . Vitals . Screenings to include cognitive, depression, and falls . Referrals and appointments  In addition, I have reviewed and discussed with patient certain preventive protocols, quality metrics, and best practice recommendations. A written personalized care plan for preventive services as well as  general preventive health recommendations were provided to patient.     Demetrios Loll, RN   01/11/2018   I have reviewed and agree with the above AWV documentation.   Remus Loffler PA-C [facility] [address]  [place], [place] [address] [phone]

## 2018-01-11 NOTE — Patient Instructions (Addendum)
Evelyn [name] , Thank you for taking time to come for your Medicare Wellness Visit. I appreciate your ongoing commitment to your health goals. Please review the following plan we discussed and let me know if I can assist you in the future.   These are the goals we discussed: Goals    . Exercise 150 min/wk Moderate Activity       This is a list of the screening recommended for you and due dates:  Health Maintenance  Topic Date Due  . Flu Shot  12/15/2017  . Pneumonia vaccines (2 of 2 - PPSV23) 01/12/2019  . Tetanus Vaccine  01/12/2023  . DEXA scan (bone density measurement)  Completed    Pneumococcal Conjugate Vaccine suspension for injection What is this medicine? PNEUMOCOCCAL VACCINE (NEU mo KOK al vak SEEN) is a vaccine used to prevent pneumococcus bacterial infections. These bacteria can cause serious infections like pneumonia, meningitis, and blood infections. This vaccine will lower your chance of getting pneumonia. If you do get pneumonia, it can make your symptoms milder and your illness shorter. This vaccine will not treat an infection and will not cause infection. This vaccine is recommended for infants and young children, adults with certain medical conditions, and adults 65 years or older. This medicine may be used for other purposes; ask your health care provider or pharmacist if you have questions. COMMON BRAND NAME(S): Prevnar, Prevnar 13 What should I tell my health care provider before I take this medicine? They need to know if you have any of these conditions: -bleeding problems -fever -immune system problems -an unusual or allergic reaction to pneumococcal vaccine, diphtheria toxoid, other vaccines, latex, other medicines, foods, dyes, or preservatives -pregnant or trying to get pregnant -breast-feeding How should I use this medicine? This vaccine is for injection into a muscle. It is given by a health care professional. A copy of Vaccine Information Statements will  be given before each vaccination. Read this sheet carefully each time. The sheet may change frequently. Talk to your pediatrician regarding the use of this medicine in children. While this drug may be prescribed for children as young as 22 weeks old for selected conditions, precautions do apply. Overdosage: If you think you have taken too much of this medicine contact a poison control center or emergency room at once. NOTE: This medicine is only for you. Do not share this medicine with others. What if I miss a dose? It is important not to miss your dose. Call your doctor or health care professional if you are unable to keep an appointment. What may interact with this medicine? -medicines for cancer chemotherapy -medicines that suppress your immune function -steroid medicines like prednisone or cortisone This list may not describe all possible interactions. Give your health care provider a list of all the medicines, herbs, non-prescription drugs, or dietary supplements you use. Also tell them if you smoke, drink alcohol, or use illegal drugs. Some items may interact with your medicine. What should I watch for while using this medicine? Mild fever and pain should go away in 3 days or less. Report any unusual symptoms to your doctor or health care professional. What side effects may I notice from receiving this medicine? Side effects that you should report to your doctor or health care professional as soon as possible: -allergic reactions like skin rash, itching or hives, swelling of the face, lips, or tongue -breathing problems -confused -fast or irregular heartbeat -fever over 102 degrees F -seizures -unusual bleeding or bruising -unusual  muscle weakness Side effects that usually do not require medical attention (report to your doctor or health care professional if they continue or are bothersome): -aches and pains -diarrhea -fever of 102 degrees F or less -headache -irritable -loss of  appetite -pain, tender at site where injected -trouble sleeping This list may not describe all possible side effects. Call your doctor for medical advice about side effects. You may report side effects to FDA at 1-800-FDA-1088. Where should I keep my medicine? This does not apply. This vaccine is given in a clinic, pharmacy, doctor's office, or other health care setting and will not be stored at home. NOTE: This sheet is a summary. It may not cover all possible information. If you have questions about this medicine, talk to your doctor, pharmacist, or health care provider.  2018 Elsevier/Gold Standard (2014-02-07 10:27:27)

## 2018-01-25 ENCOUNTER — Telehealth: Payer: Self-pay | Admitting: Physician Assistant

## 2018-01-25 DIAGNOSIS — H9192 Unspecified hearing loss, left ear: Secondary | ICD-10-CM

## 2018-01-30 NOTE — Telephone Encounter (Signed)
Patient is just wanting her hearing checked for the decreased hearing.

## 2018-01-30 NOTE — Addendum Note (Signed)
Addended by: Tamera PuntWRAY, WENDY S on: 01/30/2018 09:53 AM   Modules accepted: Orders

## 2018-01-30 NOTE — Telephone Encounter (Signed)
But if it is abnormal, will she want a hearing aid. If yes, schedule to the appropriate office

## 2018-02-21 ENCOUNTER — Other Ambulatory Visit: Payer: Self-pay | Admitting: Physician Assistant

## 2018-02-21 DIAGNOSIS — L309 Dermatitis, unspecified: Secondary | ICD-10-CM

## 2018-02-23 ENCOUNTER — Other Ambulatory Visit: Payer: Self-pay | Admitting: *Deleted

## 2018-02-23 DIAGNOSIS — M810 Age-related osteoporosis without current pathological fracture: Secondary | ICD-10-CM

## 2018-02-23 DIAGNOSIS — E039 Hypothyroidism, unspecified: Secondary | ICD-10-CM

## 2018-02-23 NOTE — Progress Notes (Signed)
Due for Reclast infusion Physician order received from [facility]. Working on completion now and will fax back to [place] at [facility].  Patient has not had a serum calcium level or creatinine clearance since Oct 2018. New order placed. Attempted to contact patient and ask her to come in for this labwork but there was no answer and no way to leave a message.  Also started prior authorization on Cover My Meds. Should receive a response within a day or two.

## 2018-03-06 ENCOUNTER — Encounter: Payer: Self-pay | Admitting: Family Medicine

## 2018-03-06 ENCOUNTER — Ambulatory Visit: Payer: Medicare HMO | Admitting: Family Medicine

## 2018-03-06 VITALS — BP 129/74 | HR 73 | Temp 97.0°F | Ht 59.75 in | Wt 115.6 lb

## 2018-03-06 DIAGNOSIS — S39012A Strain of muscle, fascia and tendon of lower back, initial encounter: Secondary | ICD-10-CM | POA: Diagnosis not present

## 2018-03-06 MED ORDER — BACLOFEN 10 MG PO TABS: 10.0000 mg | ORAL_TABLET | Freq: Three times a day (TID) | ORAL | 1 refills | Status: DC

## 2018-03-06 MED ORDER — PREDNISONE 20 MG PO TABS: ORAL_TABLET | ORAL | 0 refills | Status: DC

## 2018-03-06 NOTE — Progress Notes (Signed)
BP 129/74   Pulse 73   Temp (!) 97 F (36.1 C) (Oral)   Ht 4' 11.75" (1.518 m)   Wt 115 lb 9.6 oz (52.4 kg)   BMI 22.77 kg/m    Subjective:    Patient ID: Evelyn [name], female    DOB: [birth year], 77 y.o.   MRN: [id number]  HPI: Evelyn [name] is a 77 y.o. female presenting on 03/06/2018 for Hip Pain (Right and goes down to right knee- x 3 days)   HPI Right low back pain that sometimes goes down to the knee Patient says that she was 3 days ago in a car for about 15 to 20 minutes and then went to get out and felt like something caught or pulled in her back and then she was having cutting and searing pain.  She says it hurts most when she tries to get up and move from a laying to a sitting or sitting to a standing position.  She says she has been using some of her hydrocodone that she has from her PCP to help with this but is coming in just because it still very irritated and very painful.  She says is moderate to severe in intensity.  Patient denies any shortness of breath or wheezing.  Patient denies any numbness or weakness in either of her legs.  Patient denies any urinary symptoms.  She feels like it has been worsening and not improving over the past 3 days.  Relevant past medical, surgical, family and social history reviewed and updated as indicated. Interim medical history since our last visit reviewed. Allergies and medications reviewed and updated.  Review of Systems  Constitutional: Negative for chills and fever.  Eyes: Negative for visual disturbance.  Respiratory: Negative for chest tightness and shortness of breath.   Cardiovascular: Negative for chest pain and leg swelling.  Genitourinary: Negative for difficulty urinating and dysuria.  Musculoskeletal: Positive for back pain and myalgias. Negative for gait problem.  Skin: Negative for color change and rash.  Neurological: Negative for light-headedness and headaches.  Psychiatric/Behavioral: Negative for agitation  and behavioral problems.  All other systems reviewed and are negative.   Per HPI unless specifically indicated above   Allergies as of 03/06/2018      Reactions   Sulfa Antibiotics Swelling      Medication List        Accurate as of 03/06/18  4:51 PM. Always use your most recent med list.          ALPRAZolam 0.25 MG tablet Commonly known as:  XANAX Take 1 tablet (0.25 mg total) by mouth 3 (three) times daily as needed for anxiety.   aspirin 325 MG tablet Take 325 mg by mouth daily.   furosemide 20 MG tablet Commonly known as:  LASIX Take 1 tablet (20 mg total) by mouth daily.   HYDROcodone-acetaminophen 5-325 MG tablet Commonly known as:  NORCO/VICODIN Take 1 tablet by mouth every 6 (six) hours as needed for moderate pain.   HYDROcodone-acetaminophen 5-325 MG tablet Commonly known as:  NORCO/VICODIN Take 1 tablet by mouth every 6 (six) hours as needed for moderate pain.   HYDROcodone-acetaminophen 5-325 MG tablet Commonly known as:  NORCO/VICODIN Take 1 tablet by mouth every 6 (six) hours as needed for moderate pain.   hydrocortisone 2.5 % cream APPLY TO AFFECTED AREA TWICE A DAY   isosorbide mononitrate 60 MG 24 hr tablet Commonly known as:  IMDUR Take 1.5 tablets (90 mg total) by  mouth daily.   levothyroxine 88 MCG tablet Commonly known as:  SYNTHROID, LEVOTHROID Take 1 tablet (88 mcg total) by mouth daily before breakfast.   loratadine 10 MG tablet Commonly known as:  CLARITIN TAKE 1 TABLET BY MOUTH EVERY DAY   metoprolol tartrate 100 MG tablet Commonly known as:  LOPRESSOR Take 1 tablet (100 mg total) by mouth 2 (two) times daily.   nitroGLYCERIN 0.4 MG SL tablet Commonly known as:  NITROSTAT PLACE 1 TABLET (0.4 MG TOTAL) UNDER THE TONGUE EVERY 5 (FIVE) MINUTES AS NEEDED FOR CHEST PAIN.   RECLAST IV Inject into the vein. Once yearly   rosuvastatin 20 MG tablet Commonly known as:  CRESTOR Take 1.5 tablets (30 mg total) by mouth every evening.  Please have either primary care or cardiologist provide future refills.          Objective:    BP 129/74   Pulse 73   Temp (!) 97 F (36.1 C) (Oral)   Ht 4' 11.75" (1.518 m)   Wt 115 lb 9.6 oz (52.4 kg)   BMI 22.77 kg/m   Wt Readings from Last 3 Encounters:  03/06/18 115 lb 9.6 oz (52.4 kg)  01/11/18 115 lb (52.2 kg)  12/12/17 115 lb 9.6 oz (52.4 kg)    Physical Exam  Constitutional: She is oriented to person, place, and time. She appears well-developed and well-nourished. No distress.  Eyes: Conjunctivae are normal.  Cardiovascular: Normal rate, regular rhythm, normal heart sounds and intact distal pulses.  No murmur heard. Pulmonary/Chest: Effort normal and breath sounds normal. No respiratory distress. She has no wheezes.  Musculoskeletal: Normal range of motion. She exhibits tenderness (Right lower back tenderness, negative straight leg). She exhibits no edema.  Neurological: She is alert and oriented to person, place, and time. Coordination normal.  Skin: Skin is warm and dry. No rash noted. She is not diaphoretic.  Psychiatric: She has a normal mood and affect. Her behavior is normal.  Nursing note and vitals reviewed.       Assessment & Plan:   Problem List Items Addressed This Visit    None    Visit Diagnoses    Strain of lumbar region, initial encounter    -  Primary   Relevant Medications   predniSONE (DELTASONE) 20 MG tablet   baclofen (LIORESAL) 10 MG tablet       Follow up plan: Return if symptoms worsen or fail to improve.  Counseling provided for all of the vaccine components No orders of the defined types were placed in this encounter.   Arville Care, MD [facility] 03/06/2018, 4:51 PM

## 2018-03-14 ENCOUNTER — Other Ambulatory Visit: Payer: Medicare HMO

## 2018-03-14 DIAGNOSIS — E039 Hypothyroidism, unspecified: Secondary | ICD-10-CM | POA: Diagnosis not present

## 2018-03-15 ENCOUNTER — Ambulatory Visit: Payer: Medicare HMO | Admitting: "Endocrinology

## 2018-03-15 ENCOUNTER — Encounter (HOSPITAL_COMMUNITY): Payer: Self-pay

## 2018-03-15 ENCOUNTER — Encounter (HOSPITAL_COMMUNITY)
Admission: RE | Admit: 2018-03-15 | Discharge: 2018-03-15 | Disposition: A | Discharge status: Home / Self Care | Payer: Medicare HMO | Source: Ambulatory Visit | Attending: Physician Assistant | Admitting: Physician Assistant

## 2018-03-15 DIAGNOSIS — M810 Age-related osteoporosis without current pathological fracture: Secondary | ICD-10-CM | POA: Diagnosis not present

## 2018-03-15 LAB — BMP8+EGFR
BUN/Creatinine Ratio: 15 (ref 12–28)
BUN: 9 mg/dL (ref 8–27)
CO2: 23 mmol/L (ref 20–29)
Calcium: 9.1 mg/dL (ref 8.7–10.3)
Chloride: 103 mmol/L (ref 96–106)
Creatinine, Ser: 0.61 mg/dL (ref 0.57–1.00)
GFR calc Af Amer: 100 mL/min/{1.73_m2} (ref >59)
GFR calc non Af Amer: 87 mL/min/{1.73_m2} (ref >59)
GLUCOSE: 100 mg/dL — AB (ref 65–99)
POTASSIUM: 3.6 mmol/L (ref 3.5–5.2)
SODIUM: 143 mmol/L (ref 134–144)

## 2018-03-15 LAB — TSH: TSH: 0.131 u[IU]/mL — ABNORMAL LOW (ref 0.450–4.500)

## 2018-03-15 LAB — T4, FREE: Free T4: 1.7 ng/dL (ref 0.82–1.77)

## 2018-03-15 MED ORDER — SODIUM CHLORIDE 0.9 % IV SOLN
Freq: Once | INTRAVENOUS | Status: AC
  Administered 2018-03-15: 14:00:00 via INTRAVENOUS

## 2018-03-15 MED ORDER — ZOLEDRONIC ACID 5 MG/100ML IV SOLN
5.0000 mg | Freq: Once | INTRAVENOUS | Status: AC
  Administered 2018-03-15: 5 mg via INTRAVENOUS

## 2018-03-20 ENCOUNTER — Ambulatory Visit: Payer: Medicare HMO | Admitting: Physician Assistant

## 2018-03-20 ENCOUNTER — Encounter: Payer: Self-pay | Admitting: Physician Assistant

## 2018-03-20 VITALS — BP 123/75 | HR 81 | Temp 96.8°F | Ht 59.75 in | Wt 112.6 lb

## 2018-03-20 DIAGNOSIS — S22080D Wedge compression fracture of T11-T12 vertebra, subsequent encounter for fracture with routine healing: Secondary | ICD-10-CM | POA: Diagnosis not present

## 2018-03-20 DIAGNOSIS — S39012A Strain of muscle, fascia and tendon of lower back, initial encounter: Secondary | ICD-10-CM | POA: Diagnosis not present

## 2018-03-20 DIAGNOSIS — R69 Illness, unspecified: Secondary | ICD-10-CM | POA: Diagnosis not present

## 2018-03-20 DIAGNOSIS — F419 Anxiety disorder, unspecified: Secondary | ICD-10-CM

## 2018-03-20 MED ORDER — HYDROCODONE-ACETAMINOPHEN 5-325 MG PO TABS: 1.0000 | ORAL_TABLET | Freq: Four times a day (QID) | ORAL | 0 refills | Status: DC | PRN

## 2018-03-20 MED ORDER — BACLOFEN 10 MG PO TABS: 10.0000 mg | ORAL_TABLET | Freq: Three times a day (TID) | ORAL | 1 refills | Status: DC

## 2018-03-20 MED ORDER — ALPRAZOLAM 0.25 MG PO TABS: 0.2500 mg | ORAL_TABLET | Freq: Three times a day (TID) | ORAL | 5 refills | Status: DC | PRN

## 2018-03-21 ENCOUNTER — Ambulatory Visit: Payer: Medicare HMO | Admitting: "Endocrinology

## 2018-03-21 ENCOUNTER — Encounter: Payer: Self-pay | Admitting: "Endocrinology

## 2018-03-21 VITALS — BP 128/81 | HR 71 | Ht 59.75 in | Wt 113.0 lb

## 2018-03-21 DIAGNOSIS — E039 Hypothyroidism, unspecified: Secondary | ICD-10-CM

## 2018-03-21 MED ORDER — LEVOTHYROXINE SODIUM 75 MCG PO TABS: 75.0000 ug | ORAL_TABLET | Freq: Every day | ORAL | 6 refills | Status: DC

## 2018-03-21 NOTE — Progress Notes (Signed)
Endocrinology follow-up note  Subjective:    Patient ID: Evelyn [name], female    DOB: 05/14/1941, PCP Terald Sleeper, PA-C   Past Medical History:  Diagnosis Date  . Aneurysm ([place])   . Aortic insufficiency   . CAD (coronary artery disease)    CABG LIMA to LAD,vein graft to diagonal,vein graft to OM  . Dyslipidemia   . GERD (gastroesophageal reflux disease)   . Hypothyroid    Past Surgical History:  Procedure Laterality Date  . ABDOMINAL HYSTERECTOMY    . BACK SURGERY  2009  . CORONARY ANGIOPLASTY  2003   cutting balloon atherectomy in-stent stenosis of the LAD.  [name] CORONARY ANGIOPLASTY WITH STENT PLACEMENT  2002   remote PCI & stenting LAD,  . CORONARY ARTERY BYPASS GRAFT  2004   LIMA to LAD,vein graft to diagonal,vein graft to OM  . FOOT SURGERY    . LEFT HEART CATH AND CORS/GRAFTS ANGIOGRAPHY N/A 02/23/2017   Procedure: LEFT HEART CATH AND CORS/GRAFTS ANGIOGRAPHY;  Surgeon: Wellington Hampshire, MD;  Location: [facility];  Service: Cardiovascular;  Laterality: N/A;  . LEFT HEART CATHETERIZATION WITH CORONARY ANGIOGRAM N/A 12/15/2011   Procedure: LEFT HEART CATHETERIZATION WITH CORONARY ANGIOGRAM;  Surgeon: Sanda Klein, MD;  Location: [facility];  Service: Cardiovascular;  Laterality: N/A;  . NECK SURGERY    . NM MYOCAR PERF WALL MOTION  12/01/2011   normal study-persistent exercise induced ECG changes raises the concern for ischemia (balanced ischemia).  . NOSE SURGERY    . TONSILLECTOMY  1948  . TUBAL LIGATION     Social History   Socioeconomic History  . Marital status: Married    Spouse name: Not on file  . Number of children: 2  . Years of education: Associates  . Highest education level: Associate degree: academic program  Occupational History  . Occupation: Retired  Scientific laboratory technician  . Financial resource strain: Not hard at all  . Food insecurity:    Worry: Never true    Inability: Never true  . Transportation needs:    Medical: No   Non-medical: No  Tobacco Use  . Smoking status: Never Smoker  . Smokeless tobacco: Never Used  Substance and Sexual Activity  . Alcohol use: No  . Drug use: No  . Sexual activity: Not Currently  Lifestyle  . Physical activity:    Days per week: 7 days    Minutes per session: 30 min  . Stress: Not at all  Relationships  . Social connections:    Talks on phone: More than three times a week    Gets together: More than three times a week    Attends religious service: More than 4 times per year    Active member of club or organization: Yes    Attends meetings of clubs or organizations: More than 4 times per year    Relationship status: Widowed  Other Topics Concern  . Not on file  Social History Narrative   Mother of 2 with 4 grandchildren   Right-handed   Caffeine: 8-10 cups coffee   Outpatient Encounter Medications as of 03/21/2018  Medication Sig  . ALPRAZolam (XANAX) 0.25 MG tablet Take 1 tablet (0.25 mg total) by mouth 3 (three) times daily as needed for anxiety.  [name] aspirin 325 MG tablet Take 325 mg by mouth daily.  . baclofen (LIORESAL) 10 MG tablet Take 1 tablet (10 mg total) by mouth 3 (three) times daily.  . furosemide (LASIX) 20  MG tablet Take 1 tablet (20 mg total) by mouth daily.  [name] HYDROcodone-acetaminophen (NORCO) 5-325 MG tablet Take 1 tablet by mouth every 6 (six) hours as needed for moderate pain.  . hydrocortisone 2.5 % cream APPLY TO AFFECTED AREA TWICE A DAY  . isosorbide mononitrate (IMDUR) 60 MG 24 hr tablet Take 1.5 tablets (90 mg total) by mouth daily.  [name] levothyroxine (SYNTHROID, LEVOTHROID) 75 MCG tablet Take 1 tablet (75 mcg total) by mouth daily before breakfast.  . loratadine (CLARITIN) 10 MG tablet TAKE 1 TABLET BY MOUTH EVERY DAY  . metoprolol tartrate (LOPRESSOR) 100 MG tablet Take 1 tablet (100 mg total) by mouth 2 (two) times daily.  . nitroGLYCERIN (NITROSTAT) 0.4 MG SL tablet PLACE 1 TABLET (0.4 MG TOTAL) UNDER THE TONGUE EVERY 5 (FIVE) MINUTES AS  NEEDED FOR CHEST PAIN.  . rosuvastatin (CRESTOR) 20 MG tablet Take 1.5 tablets (30 mg total) by mouth every evening. Please have either primary care or cardiologist provide future refills.  . Zoledronic Acid (RECLAST IV) Inject into the vein. Once yearly  . [DISCONTINUED] HYDROcodone-acetaminophen (NORCO) 5-325 MG tablet Take 1 tablet by mouth every 6 (six) hours as needed for moderate pain.  . [DISCONTINUED] HYDROcodone-acetaminophen (NORCO/VICODIN) 5-325 MG tablet Take 1 tablet by mouth every 6 (six) hours as needed for moderate pain.  . [DISCONTINUED] levothyroxine (SYNTHROID, LEVOTHROID) 88 MCG tablet Take 1 tablet (88 mcg total) by mouth daily before breakfast.   No facility-administered encounter medications on file as of 03/21/2018.    ALLERGIES: Allergies  Allergen Reactions  . Sulfa Antibiotics Swelling   VACCINATION STATUS: Immunization History  Administered Date(s) Administered  . Influenza, High Dose Seasonal PF 03/09/2016, 03/02/2017  . Pneumococcal Conjugate-13 01/11/2018    HPI  77 year old female with medical history as above. She is being seen in follow-up for hypothyroidism and multinodular goiter. She is on levothyroxine 88 mcg p.o. every morning.   - She is status post FNA 3 nodules in her thyroid benign findings. -She is compliant to her medications.  She has no new complaints today.  -Her previsit thyroid function tests are consistent with over replacement. - She denies family history of thyroid dysfunction. She feels occasional choking sensation in her neck, no recent voice change nor shortness of breath. She denies any exposure to neck radiation. - She denies any history of smoking.  Review of Systems Constitutional: She has a steady body weight , no fatigue, no subjective hyper or hypothermia.    Eyes: no blurry vision, no xerophthalmia ENT: no sore throat, + nodules palpated in throat,  + intermittent dysphagia/odynophagia, no hoarseness Cardiovascular: no  chest pain, no shortness of breath,  No palpitations.   Musculoskeletal: no muscle/joint aches Skin: no rashes Neurological: denies Tremors, tingling. Psychiatric: no depression/anxiety  Objective:    BP 128/81   Pulse 71   Ht 4' 11.75" (1.518 m)   Wt 113 lb (51.3 kg)   BMI 22.25 kg/m   Wt Readings from Last 3 Encounters:  03/21/18 113 lb (51.3 kg)  03/20/18 112 lb 9.6 oz (51.1 kg)  03/06/18 115 lb 9.6 oz (52.4 kg)    Physical Exam  Constitutional: Not in acute distress, normal state of mind. 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, strength intact in all 4 Skin: moist, warm, no rashes Neurological: - tremor with outstretched hands, DTR normal in all 4  CMP  Component Value Date/Time   NA 143 03/14/2018 1023   K 3.6 03/14/2018 1023   CL 103 03/14/2018 1023   CO2 23 03/14/2018 1023   GLUCOSE 100 (H) 03/14/2018 1023   GLUCOSE 99 09/01/2016 0550   BUN 9 03/14/2018 1023   CREATININE 0.61 03/14/2018 1023   CREATININE 0.59 07/11/2014 0913   CALCIUM 9.1 03/14/2018 1023   PROT 6.4 07/21/2016 0935   ALBUMIN 4.1 07/21/2016 0935   AST 12 07/21/2016 0935   ALT 13 07/21/2016 0935   ALKPHOS 55 07/21/2016 0935   BILITOT 0.4 07/21/2016 0935   GFRNONAA 87 03/14/2018 1023   GFRAA 100 03/14/2018 1023   Recent Results (from the past 2160 hour(s))  T4, Free     Status: None   Collection Time: 03/14/18 10:23 AM  Result Value Ref Range   Free T4 1.70 0.82 - 1.77 ng/dL  TSH     Status: Abnormal   Collection Time: 03/14/18 10:23 AM  Result Value Ref Range   TSH 0.131 (L) 0.450 - 4.500 uIU/mL  BMP8+EGFR     Status: Abnormal   Collection Time: 03/14/18 10:23 AM  Result Value Ref Range   Glucose 100 (H) 65 - 99 mg/dL   BUN 9 8 - 27 mg/dL   Creatinine, Ser 0.61 0.57 - 1.00 mg/dL   GFR calc non Af Amer 87 >59 mL/min/1.73   GFR calc Af Amer  100 >59 mL/min/1.73   BUN/Creatinine Ratio 15 12 - 28   Sodium 143 134 - 144 mmol/L   Potassium 3.6 3.5 - 5.2 mmol/L   Chloride 103 96 - 106 mmol/L   CO2 23 20 - 29 mmol/L   Calcium 9.1 8.7 - 10.3 mg/dL   Fine-needle aspiration samples  of 3 nodules in her thyroid is benign   Assessment & Plan:   1.  hypothyroidism 2. Multinodular goiter  -Her previsit thyroid function tests are still consistent with over replacement.  I discussed and lowered her levothyroxine to 75 mcg p.o. every morning.     - We discussed about correct intake of levothyroxine, at fasting, with water, separated by at least 30 minutes from breakfast, and separated by more than 4 hours from calcium, iron, multivitamins, acid reflux medications (PPIs). -Patient is made aware of the fact that thyroid hormone replacement is needed for life, dose to be adjusted by periodic monitoring of thyroid function tests.  - Biopsy off  3 nodules is negative for malignancy. She will not need intervention at this time. She will have repeat ultrasound of the thyroid before her next visit.     - I advised patient to maintain close follow up with Terald Sleeper, PA-C for primary care needs.  Follow up plan: Return in about 6 months (around 09/19/2018) for Follow up with Pre-visit Labs, Thyroid / Neck Ultrasound.  Glade Lloyd, MD Phone: [phone]  Fax: [phone]  -  This note was partially dictated with voice recognition software. Similar sounding words can be transcribed inadequately or may not  be corrected upon review.  03/21/2018, 1:18 PM

## 2018-03-21 NOTE — Progress Notes (Signed)
BP 123/75   Pulse 81   Temp (!) 96.8 F (36 C) (Oral)   Ht 4' 11.75" (1.518 m)   Wt 112 lb 9.6 oz (51.1 kg)   BMI 22.18 kg/m    Subjective:    Patient ID: Evelyn [name], female    DOB: 1940/08/13, 77 y.o.   MRN: [id number]  HPI: Evelyn [name] is a 77 y.o. female presenting on 03/20/2018 for Hypertension; Hypothyroidism; Nasal Congestion; and Cough  This patient comes in for periodic recheck on her hypertension, hypothyroidism and she also is having a current cough and congestion for several weeks.  She has tried multiple over-the-counter medications without much relief.  She denies any fever or chills.  She is doing very well with her blood pressure and thyroid.  She will be seeing her endocrinologist soon.  She still goes to the cardiologist once a year.  Overall her back has been stable.  NEW narcotic contract is reviewed and signed at today's visit.   Past Medical History:  Diagnosis Date  . Aneurysm ([place])   . Aortic insufficiency   . CAD (coronary artery disease)    CABG LIMA to LAD,vein graft to diagonal,vein graft to OM  . Dyslipidemia   . GERD (gastroesophageal reflux disease)   . Hypothyroid    Relevant past medical, surgical, family and social history reviewed and updated as indicated. Interim medical history since our last visit reviewed. Allergies and medications reviewed and updated. DATA REVIEWED: CHART IN EPIC  Family History reviewed for pertinent findings.  Review of Systems  Constitutional: Positive for chills and fatigue. Negative for activity change and appetite change.  HENT: Positive for congestion, postnasal drip and sore throat.   Eyes: Negative.   Respiratory: Positive for cough and wheezing.   Cardiovascular: Negative.  Negative for chest pain, palpitations and leg swelling.  Gastrointestinal: Negative.   Genitourinary: Negative.   Musculoskeletal: Negative.   Skin: Negative.   Neurological: Positive for headaches.    Allergies as of  03/20/2018      Reactions   Sulfa Antibiotics Swelling      Medication List        Accurate as of 03/20/18 11:59 PM. Always use your most recent med list.          ALPRAZolam 0.25 MG tablet Commonly known as:  XANAX Take 1 tablet (0.25 mg total) by mouth 3 (three) times daily as needed for anxiety.   aspirin 325 MG tablet Take 325 mg by mouth daily.   baclofen 10 MG tablet Commonly known as:  LIORESAL Take 1 tablet (10 mg total) by mouth 3 (three) times daily.   furosemide 20 MG tablet Commonly known as:  LASIX Take 1 tablet (20 mg total) by mouth daily.   HYDROcodone-acetaminophen 5-325 MG tablet Commonly known as:  NORCO/VICODIN Take 1 tablet by mouth every 6 (six) hours as needed for moderate pain.   HYDROcodone-acetaminophen 5-325 MG tablet Commonly known as:  NORCO/VICODIN Take 1 tablet by mouth every 6 (six) hours as needed for moderate pain.   HYDROcodone-acetaminophen 5-325 MG tablet Commonly known as:  NORCO/VICODIN Take 1 tablet by mouth every 6 (six) hours as needed for moderate pain.   hydrocortisone 2.5 % cream APPLY TO AFFECTED AREA TWICE A DAY   isosorbide mononitrate 60 MG 24 hr tablet Commonly known as:  IMDUR Take 1.5 tablets (90 mg total) by mouth daily.   levothyroxine 88 MCG tablet Commonly known as:  SYNTHROID, LEVOTHROID Take 1 tablet (88  mcg total) by mouth daily before breakfast.   loratadine 10 MG tablet Commonly known as:  CLARITIN TAKE 1 TABLET BY MOUTH EVERY DAY   metoprolol tartrate 100 MG tablet Commonly known as:  LOPRESSOR Take 1 tablet (100 mg total) by mouth 2 (two) times daily.   nitroGLYCERIN 0.4 MG SL tablet Commonly known as:  NITROSTAT PLACE 1 TABLET (0.4 MG TOTAL) UNDER THE TONGUE EVERY 5 (FIVE) MINUTES AS NEEDED FOR CHEST PAIN.   RECLAST IV Inject into the vein. Once yearly   rosuvastatin 20 MG tablet Commonly known as:  CRESTOR Take 1.5 tablets (30 mg total) by mouth every evening. Please have either primary  care or cardiologist provide future refills.          Objective:    BP 123/75   Pulse 81   Temp (!) 96.8 F (36 C) (Oral)   Ht 4' 11.75" (1.518 m)   Wt 112 lb 9.6 oz (51.1 kg)   BMI 22.18 kg/m   Allergies  Allergen Reactions  . Sulfa Antibiotics Swelling    Wt Readings from Last 3 Encounters:  03/21/18 113 lb (51.3 kg)  03/20/18 112 lb 9.6 oz (51.1 kg)  03/06/18 115 lb 9.6 oz (52.4 kg)    Physical Exam  Constitutional: She is oriented to person, place, and time. She appears well-developed and well-nourished.  HENT:  Head: Normocephalic and atraumatic.  Right Ear: A middle ear effusion is present.  Left Ear: A middle ear effusion is present.  Nose: Mucosal edema present. Right sinus exhibits no frontal sinus tenderness. Left sinus exhibits no frontal sinus tenderness.  Mouth/Throat: Posterior oropharyngeal erythema present. No oropharyngeal exudate or tonsillar abscesses.  Eyes: Pupils are equal, round, and reactive to light. Conjunctivae and EOM are normal.  Neck: Normal range of motion.  Cardiovascular: Normal rate, regular rhythm, normal heart sounds and intact distal pulses.  Pulmonary/Chest: Effort normal and breath sounds normal.  Abdominal: Soft. Bowel sounds are normal.  Neurological: She is alert and oriented to person, place, and time. She has normal reflexes.  Skin: Skin is warm and dry. No rash noted.  Psychiatric: She has a normal mood and affect. Her behavior is normal. Judgment and thought content normal.  Nursing note and vitals reviewed.   Results for orders placed or performed in visit on 03/14/18  T4, Free  Result Value Ref Range   Free T4 1.70 0.82 - 1.77 ng/dL  TSH  Result Value Ref Range   TSH 0.131 (L) 0.450 - 4.500 uIU/mL  BMP8+EGFR  Result Value Ref Range   Glucose 100 (H) 65 - 99 mg/dL   BUN 9 8 - 27 mg/dL   Creatinine, Ser 0.61 0.57 - 1.00 mg/dL   GFR calc non Af Amer 87 >59 mL/min/1.73   GFR calc Af Amer 100 >59 mL/min/1.73    BUN/Creatinine Ratio 15 12 - 28   Sodium 143 134 - 144 mmol/L   Potassium 3.6 3.5 - 5.2 mmol/L   Chloride 103 96 - 106 mmol/L   CO2 23 20 - 29 mmol/L   Calcium 9.1 8.7 - 10.3 mg/dL      Assessment & Plan:   1. Closed wedge compression fracture of eleventh thoracic vertebra with routine healing, subsequent encounter - HYDROcodone-acetaminophen (NORCO) 5-325 MG tablet; Take 1 tablet by mouth every 6 (six) hours as needed for moderate pain.  Dispense: 120 tablet; Refill: 0  2. Strain of lumbar region, initial encounter - baclofen (LIORESAL) 10 MG tablet; Take 1 tablet (  10 mg total) by mouth 3 (three) times daily.  Dispense: 30 each; Refill: 1  3. Anxiety - ALPRAZolam (XANAX) 0.25 MG tablet; Take 1 tablet (0.25 mg total) by mouth 3 (three) times daily as needed for anxiety.  Dispense: 30 tablet; Refill: 5   Continue all other maintenance medications as listed above.  Follow up plan: Return in about 3 months (around 06/20/2018).  Educational handout given for [place] PA-C [facility] [address]  [place], [place] [address] [phone]   03/21/2018, 10:34 PM

## 2018-03-29 MED FILL — Sodium Chloride IV Soln 0.9%: INTRAVENOUS | Qty: 250 | Status: AC

## 2018-05-13 ENCOUNTER — Emergency Department (HOSPITAL_COMMUNITY): Payer: Medicare HMO

## 2018-05-13 ENCOUNTER — Encounter (HOSPITAL_COMMUNITY): Payer: Self-pay

## 2018-05-13 ENCOUNTER — Emergency Department (HOSPITAL_COMMUNITY)
Admission: EM | Admit: 2018-05-13 | Discharge: 2018-05-14 | Disposition: A | Discharge status: Home / Self Care | Payer: Medicare HMO | Attending: Emergency Medicine | Admitting: Emergency Medicine

## 2018-05-13 DIAGNOSIS — M25511 Pain in right shoulder: Secondary | ICD-10-CM | POA: Diagnosis not present

## 2018-05-13 DIAGNOSIS — I25810 Atherosclerosis of coronary artery bypass graft(s) without angina pectoris: Secondary | ICD-10-CM | POA: Diagnosis not present

## 2018-05-13 DIAGNOSIS — M542 Cervicalgia: Secondary | ICD-10-CM | POA: Insufficient documentation

## 2018-05-13 DIAGNOSIS — E039 Hypothyroidism, unspecified: Secondary | ICD-10-CM | POA: Insufficient documentation

## 2018-05-13 DIAGNOSIS — I10 Essential (primary) hypertension: Secondary | ICD-10-CM | POA: Insufficient documentation

## 2018-05-13 DIAGNOSIS — Z79899 Other long term (current) drug therapy: Secondary | ICD-10-CM | POA: Insufficient documentation

## 2018-05-13 DIAGNOSIS — Z7982 Long term (current) use of aspirin: Secondary | ICD-10-CM | POA: Insufficient documentation

## 2018-05-13 MED ORDER — PREDNISONE 20 MG PO TABS: 60.0000 mg | ORAL_TABLET | Freq: Once | ORAL | Status: DC

## 2018-05-13 MED ORDER — LIDOCAINE 5 % EX PTCH
1.0000 | MEDICATED_PATCH | CUTANEOUS | Status: DC
  Administered 2018-05-13: 1 via TRANSDERMAL
  Filled 2018-05-13: qty 1

## 2018-05-13 MED ORDER — HYDROCODONE-ACETAMINOPHEN 5-325 MG PO TABS: 2.0000 | ORAL_TABLET | Freq: Once | ORAL | Status: DC

## 2018-05-13 NOTE — ED Triage Notes (Signed)
Pt endorses posterior neck pain x 3 weeks. Has hx of same and seen multiple drs for it. States that her left leg is giving out occasionally. Ambulatory here and moving all extremities well. NAD, VSS>

## 2018-05-13 NOTE — ED Provider Notes (Signed)
[facility] EMERGENCY DEPARTMENT Provider Note   CSN: [id number] Arrival date & time: 05/13/18  1829     History   Chief Complaint Chief Complaint  Patient presents with  . Neck Pain    HPI Evelyn [name] is a 77 y.o. female.  HPI  Patient is a 77 year old female with past medical history of CAD, dyslipidemia, GERD, hypothyroidism, and chronic neck and back pain currently being followed by outpatient pain management clinic who presents for evaluation of acute on chronic neck pain that she states began approximate 5 days ago it radiates down her right arm.  She denies any acute traumatic injuries or heavy lifting precipitating the onset of the pain.  She denies any other acute symptoms including headache, earache, sore throat, chest pain, cough, trouble breathing abdominal pain, nausea, vomiting, diarrhea, dysuria, rash, or other acute complaints.  She denies any weakness or numbness in any of her extremities.  She endorses prior similar episodes and states she is currently taking Percocet and Robaxin at home with minimal improvement in her pain.  She denies any other alleviating or aggravating factors.  Past Medical History:  Diagnosis Date  . Aneurysm ([facility])   . Aortic insufficiency   . CAD (coronary artery disease)    CABG LIMA to LAD,vein graft to diagonal,vein graft to OM  . Dyslipidemia   . GERD (gastroesophageal reflux disease)   . Hypothyroid     Patient Active Problem List   Diagnosis Date Noted  . Pain management 08/01/2017  . 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 Eczema 01/25/2017  . Anxiety 07/21/2016  . Multinodular goiter 03/09/2016  . 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 Wedge compression fracture of T11 vertebra with routine healing 03/09/2016  . Ascending aortic aneurysm ([facility]) 01/17/2016  . Aortic insufficiency 09/16/2013  . HTN (hypertension) 09/16/2013  . Possible TIA  (transient ischemic attack) 09/16/2013  . Unstable angina ([facility]) 12/15/2011  . Abnormal nuclear cardiac imaging test 12/15/2011  . CAD, PCI '02, '04. CABG X 3 2004 12/15/2011  . Dyslipidemia 12/15/2011  . Hypothyroidism 12/15/2011    Past Surgical History:  Procedure Laterality Date  . ABDOMINAL HYSTERECTOMY    . BACK SURGERY  2009  . CORONARY ANGIOPLASTY  2003   cutting balloon atherectomy in-stent stenosis of the LAD.  [name] CORONARY ANGIOPLASTY WITH STENT PLACEMENT  2002   remote PCI & stenting LAD,  . CORONARY ARTERY BYPASS GRAFT  2004   LIMA to LAD,vein graft to diagonal,vein graft to OM  . FOOT SURGERY    . LEFT HEART CATH AND CORS/GRAFTS ANGIOGRAPHY N/A 02/23/2017   Procedure: LEFT HEART CATH AND CORS/GRAFTS ANGIOGRAPHY;  Surgeon: Iran Ouch, MD;  Location: [facility] INVASIVE CV LAB;  Service: Cardiovascular;  Laterality: N/A;  . LEFT HEART CATHETERIZATION WITH CORONARY ANGIOGRAM N/A 12/15/2011   Procedure: LEFT HEART CATHETERIZATION WITH CORONARY ANGIOGRAM;  Surgeon: Thurmon Fair, MD;  Location: [facility] CATH LAB;  Service: Cardiovascular;  Laterality: N/A;  . NECK SURGERY    . NM MYOCAR PERF WALL MOTION  12/01/2011   normal study-persistent exercise induced ECG changes raises the concern for ischemia (balanced ischemia).  . NOSE SURGERY    . TONSILLECTOMY  1948  . TUBAL LIGATION       OB History   No obstetric history on file.      Home Medications    Prior to Admission medications   Medication Sig Start Date End Date  Taking? Authorizing Provider  ALPRAZolam (XANAX) 0.25 MG tablet Take 1 tablet (0.25 mg total) by mouth 3 (three) times daily as needed for anxiety. 03/20/18   Remus LofflerJones, Angel S, PA-C  aspirin 325 MG tablet Take 325 mg by mouth daily.    [provider]  baclofen (LIORESAL) 10 MG tablet Take 1 tablet (10 mg total) by mouth 3 (three) times daily. 03/20/18   Remus LofflerJones, Angel S, PA-C  furosemide (LASIX) 20 MG tablet Take 1 tablet (20 mg total) by mouth daily.  08/01/17   Remus LofflerJones, Angel S, PA-C  HYDROcodone-acetaminophen (NORCO) 5-325 MG tablet Take 1 tablet by mouth every 6 (six) hours as needed for moderate pain. 03/20/18   Remus LofflerJones, Angel S, PA-C  hydrocortisone 2.5 % cream APPLY TO AFFECTED AREA TWICE A DAY 10/07/17   Remus LofflerJones, Angel S, PA-C  isosorbide mononitrate (IMDUR) 60 MG 24 hr tablet Take 1.5 tablets (90 mg total) by mouth daily. 05/09/17 05/09/18  Croitoru, Mihai, MD  levothyroxine (SYNTHROID, LEVOTHROID) 75 MCG tablet Take 1 tablet (75 mcg total) by mouth daily before breakfast. 03/21/18   Nida, Denman GeorgeGebreselassie W, MD  loratadine (CLARITIN) 10 MG tablet TAKE 1 TABLET BY MOUTH EVERY DAY 02/21/18   Remus LofflerJones, Angel S, PA-C  metoprolol tartrate (LOPRESSOR) 100 MG tablet Take 1 tablet (100 mg total) by mouth 2 (two) times daily. 08/01/17   Remus LofflerJones, Angel S, PA-C  nitroGLYCERIN (NITROSTAT) 0.4 MG SL tablet PLACE 1 TABLET (0.4 MG TOTAL) UNDER THE TONGUE EVERY 5 (FIVE) MINUTES AS NEEDED FOR CHEST PAIN. 05/26/17   Croitoru, Mihai, MD  predniSONE (DELTASONE) 10 MG tablet Take 4 tablets (40 mg total) by mouth daily for 5 days. 05/14/18 05/19/18  Antoine PrimasSmith, , MD  rosuvastatin (CRESTOR) 20 MG tablet Take 1.5 tablets (30 mg total) by mouth every evening. Please have either primary care or cardiologist provide future refills. 10/13/17   Anson FretAhern, Antonia B, MD  Zoledronic Acid (RECLAST IV) Inject into the vein. Once yearly    [provider]    Family History Family History  Problem Relation Age of Onset  . Heart attack Mother   . Heart failure Father   . Hypertension Father   . Asthma Maternal Grandmother   . Stroke Brother   . Voice disorder Daughter        vocal dystonia  . Stroke Daughter   . Cancer Sister [id number]    Social History Social History   Tobacco Use  . Smoking status: Never Smoker  . Smokeless tobacco: Never Used  Substance Use Topics  . Alcohol use: No  . Drug use: No     Allergies   Sulfa antibiotics   Review of Systems Review of  Systems  Constitutional: Negative for chills and fever.  HENT: Negative for ear pain and sore throat.   Eyes: Negative for pain and visual disturbance.  Respiratory: Negative for cough and shortness of breath.   Cardiovascular: Negative for chest pain and palpitations.  Gastrointestinal: Negative for abdominal pain and vomiting.  Genitourinary: Negative for dysuria and hematuria.  Musculoskeletal: Positive for myalgias ( neck and R shoulder) and neck pain. Negative for arthralgias and back pain.  Skin: Negative for color change and rash.  Neurological: Negative for seizures and syncope.  All other systems reviewed and are negative.    Physical Exam Updated Vital Signs BP (!) 154/70 (BP Location: Right Arm)   Pulse 90   Temp 98.2 F (36.8 C) (Oral)   Resp 16   SpO2 96%  Physical Exam Vitals signs and nursing note reviewed.  Constitutional:      General: She is not in acute distress.    Appearance: She is well-developed.  HENT:     Head: Normocephalic and atraumatic.     Right Ear: External ear normal.     Left Ear: External ear normal.     Nose: Nose normal.     Mouth/Throat:     Mouth: Mucous membranes are moist.  Eyes:     Conjunctiva/sclera: Conjunctivae normal.  Neck:     Musculoskeletal: Neck supple. No neck rigidity.  Cardiovascular:     Rate and Rhythm: Normal rate and regular rhythm.     Heart sounds: No murmur.  Pulmonary:     Effort: Pulmonary effort is normal. No respiratory distress.     Breath sounds: Normal breath sounds.  Abdominal:     Palpations: Abdomen is soft.     Tenderness: There is no abdominal tenderness.  Skin:    General: Skin is warm and dry.     Capillary Refill: Capillary refill takes less than 2 seconds.  Neurological:     General: No focal deficit present.     Mental Status: She is alert.     Comments: Cranial nerves II through XII are intact.  Patient has no finger dysmetria or pronator drift.  Patient has full strength in both  upper extremities as well as both lower extremities.  Sensation is intact throughout all extremities to light touch.  Patient does have some mild tenderness palpation over her right superior posterior shoulder.      ED Treatments / Results  Labs (all labs ordered are listed, but only abnormal results are displayed) Labs Reviewed - No data to display  EKG None  Radiology Dg Shoulder Right  Result Date: 05/13/2018 CLINICAL DATA:  Subacute onset of right shoulder and posterior neck pain. EXAM: RIGHT SHOULDER - 2+ VIEW COMPARISON:  Right shoulder radiographs performed 01/06/2016 FINDINGS: There is no evidence of fracture or dislocation. The right humeral head is seated within the glenoid fossa. Mild degenerative change is noted at the right acromioclavicular joint. No significant soft tissue abnormalities are seen. The visualized portions of the right lung are clear. The patient is status post median sternotomy. Cervical spinal fusion hardware is noted. IMPRESSION: No evidence of fracture or dislocation. Electronically Signed   By: Roanna Raider M.D.   On: 05/13/2018 22:19   Ct Cervical Spine Wo Contrast  Result Date: 05/14/2018 CLINICAL DATA:  Neck pain radiating to right shoulder three days. EXAM: CT CERVICAL SPINE WITHOUT CONTRAST TECHNIQUE: Multidetector CT imaging of the cervical spine was performed without intravenous contrast. Multiplanar CT image reconstructions were also generated. COMPARISON:  CTA head neck 10/06/2016 FINDINGS: Alignment: Grade 1 anterolisthesis at C4-C5 unchanged. Skull base and vertebrae: No acute fracture. There is C5-7 ACDF. Soft tissues and spinal canal: No prevertebral fluid or swelling. No visible canal hematoma. Disc levels: Bilateral facet arthrosis at C4-5. Upper chest: No pneumothorax, pulmonary nodule or pleural effusion. Other: Normal visualized paraspinal cervical soft tissues. IMPRESSION: 1. No acute fracture or static subluxation of the cervical spine. 2.  C5-7 ACDF without adverse features. Electronically Signed   By: Deatra Robinson M.D.   On: 05/14/2018 00:23    Procedures Procedures (including critical care time)  Medications Ordered in ED Medications  lidocaine (LIDODERM) 5 % 1 patch (1 patch Transdermal Patch Applied 05/13/18 2235)  HYDROcodone-acetaminophen (NORCO/VICODIN) 5-325 MG per tablet 2 tablet (has no administration in time  range)  predniSONE (DELTASONE) tablet 60 mg (has no administration in time range)     Initial Impression / Assessment and Plan / ED Course  I have reviewed the triage vital signs and the nursing notes.  Pertinent labs & imaging results that were available during my care of the patient were reviewed by me and considered in my medical decision making (see chart for details).     Patient is an annual female who presents above-stated history exam.  On presentation patient is afebrile stable vital signs.  Exam as above.  ABCs intact as above.  Exam remarkable for no focal neurological deficits, sling scar to auscultation bilaterally, abdomen soft nontender.  History exam is most consistent with cervical radiculopathy.  CT of the neck was obtained that did not show any acute fractures dislocations.  In addition an x-ray of the patient's right shoulder was obtained that showed no acute fractures dislocations.  History exam is not consistent with sepsis, meningitis, acute traumatic injury, DVT, cellulitis, or other imminent life-threatening etiology.  Patient given p.o. analgesia in the emergency department.  Patient discharged in stable condition.  Strict precautions advised discussed.  Patient was discharged with a prescription for prednisone to be taken for 5 days and instructed follow-up with her PCP in 2 to 3 days.  Patient voiced understanding and agreement with this plan.  Final Clinical Impressions(s) / ED Diagnoses   Final diagnoses:  Neck pain   ED Discharge Orders         Ordered    predniSONE  (DELTASONE) 10 MG tablet  Daily     05/14/18 0052           Antoine PrimasSmith, , MD 05/14/18 0112    Charlynne PanderYao, David Hsienta, MD 05/16/18 Barry Brunner1935

## 2018-05-14 ENCOUNTER — Emergency Department (HOSPITAL_COMMUNITY): Payer: Medicare HMO

## 2018-05-14 DIAGNOSIS — Z79899 Other long term (current) drug therapy: Secondary | ICD-10-CM | POA: Diagnosis not present

## 2018-05-14 DIAGNOSIS — I25810 Atherosclerosis of coronary artery bypass graft(s) without angina pectoris: Secondary | ICD-10-CM | POA: Diagnosis not present

## 2018-05-14 DIAGNOSIS — E039 Hypothyroidism, unspecified: Secondary | ICD-10-CM | POA: Diagnosis not present

## 2018-05-14 DIAGNOSIS — Z7982 Long term (current) use of aspirin: Secondary | ICD-10-CM | POA: Diagnosis not present

## 2018-05-14 DIAGNOSIS — I10 Essential (primary) hypertension: Secondary | ICD-10-CM | POA: Diagnosis not present

## 2018-05-14 DIAGNOSIS — M542 Cervicalgia: Secondary | ICD-10-CM | POA: Diagnosis not present

## 2018-05-14 MED ORDER — PREDNISONE 10 MG PO TABS: 40.0000 mg | ORAL_TABLET | Freq: Every day | ORAL | 0 refills | Status: AC

## 2018-05-14 NOTE — ED Notes (Signed)
Patient verbalizes understanding of discharge instructions. Opportunity for questioning and answers were provided. Armband removed by staff, pt discharged from ED.  

## 2018-05-18 ENCOUNTER — Telehealth: Payer: Self-pay | Admitting: Physician Assistant

## 2018-05-18 NOTE — Telephone Encounter (Signed)
Pt scheduled with AJ 05/23/18 at 11:40 for ER follow up due to neck pain.

## 2018-05-22 ENCOUNTER — Other Ambulatory Visit: Payer: Self-pay | Admitting: Cardiovascular Disease

## 2018-05-23 ENCOUNTER — Encounter: Payer: Self-pay | Admitting: Physician Assistant

## 2018-05-23 ENCOUNTER — Ambulatory Visit: Payer: Medicare HMO | Admitting: Physician Assistant

## 2018-05-23 VITALS — BP 136/77 | HR 50 | Temp 96.7°F | Ht 59.75 in | Wt 114.0 lb

## 2018-05-23 DIAGNOSIS — S22080D Wedge compression fracture of T11-T12 vertebra, subsequent encounter for fracture with routine healing: Secondary | ICD-10-CM

## 2018-05-23 DIAGNOSIS — M5412 Radiculopathy, cervical region: Secondary | ICD-10-CM | POA: Insufficient documentation

## 2018-05-23 MED ORDER — PREDNISONE 10 MG (48) PO TBPK: ORAL_TABLET | ORAL | 0 refills | Status: DC

## 2018-05-23 MED ORDER — HYDROCODONE-ACETAMINOPHEN 10-325 MG PO TABS: 1.0000 | ORAL_TABLET | Freq: Four times a day (QID) | ORAL | 0 refills | Status: DC | PRN

## 2018-05-23 NOTE — Telephone Encounter (Signed)
Rx request sent to pharmacy.  

## 2018-05-25 ENCOUNTER — Telehealth: Payer: Self-pay | Admitting: Physician Assistant

## 2018-05-25 NOTE — Progress Notes (Signed)
BP 136/77   Pulse (!) 50   Temp (!) 96.7 F (35.9 C) (Oral)   Ht 4' 11.75" (1.518 m)   Wt 114 lb (51.7 kg)   BMI 22.45 kg/m    Subjective:    Patient ID: Evelyn [name], female    DOB: 12/28/1940, 78 y.o.   MRN: [id number]  HPI: Evelyn [name] is a 78 y.o. female presenting on 05/23/2018 for Back Pain (ER follow up )  She has had a greater flare up of the DDD and compression fracture pain in her neck and upper back. She even had to go to the ED for treatment because the pain got so bad.  She had 3 surgeries in the past by Dr. Joya Salm.  Past Medical History:  Diagnosis Date  . Aneurysm ([place])   . Aortic insufficiency   . CAD (coronary artery disease)    CABG LIMA to LAD,vein graft to diagonal,vein graft to OM  . Dyslipidemia   . GERD (gastroesophageal reflux disease)   . Hypothyroid    Relevant past medical, surgical, family and social history reviewed and updated as indicated. Interim medical history since our last visit reviewed. Allergies and medications reviewed and updated. DATA REVIEWED: CHART IN EPIC  Family History reviewed for pertinent findings.  Review of Systems  Constitutional: Negative.   HENT: Negative.   Eyes: Negative.   Respiratory: Negative.   Gastrointestinal: Negative.   Genitourinary: Negative.     Allergies as of 05/23/2018      Reactions   Sulfa Antibiotics Swelling      Medication List       Accurate as of May 23, 2018 11:59 PM. Always use your most recent med list.        ALPRAZolam 0.25 MG tablet Commonly known as:  XANAX Take 1 tablet (0.25 mg total) by mouth 3 (three) times daily as needed for anxiety.   aspirin 325 MG tablet Take 325 mg by mouth daily.   baclofen 10 MG tablet Commonly known as:  LIORESAL Take 1 tablet (10 mg total) by mouth 3 (three) times daily.   furosemide 20 MG tablet Commonly known as:  LASIX Take 1 tablet (20 mg total) by mouth daily.   HYDROcodone-acetaminophen 10-325 MG tablet Commonly  known as:  NORCO Take 1 tablet by mouth every 6 (six) hours as needed.   hydrocortisone 2.5 % cream APPLY TO AFFECTED AREA TWICE A DAY   isosorbide mononitrate 60 MG 24 hr tablet Commonly known as:  IMDUR Take 1.5 tablets (90 mg total) by mouth daily.   levothyroxine 75 MCG tablet Commonly known as:  SYNTHROID, LEVOTHROID Take 1 tablet (75 mcg total) by mouth daily before breakfast.   loratadine 10 MG tablet Commonly known as:  CLARITIN TAKE 1 TABLET BY MOUTH EVERY DAY   metoprolol tartrate 100 MG tablet Commonly known as:  LOPRESSOR Take 1 tablet (100 mg total) by mouth 2 (two) times daily.   nitroGLYCERIN 0.4 MG SL tablet Commonly known as:  NITROSTAT PLACE 1 TABLET (0.4 MG TOTAL) UNDER THE TONGUE EVERY 5 (FIVE) MINUTES AS NEEDED FOR CHEST PAIN.   predniSONE 10 MG (48) Tbpk tablet Commonly known as:  STERAPRED UNI-PAK 48 TAB Take as directed for 12 days   RECLAST IV Inject into the vein. Once yearly   rosuvastatin 20 MG tablet Commonly known as:  CRESTOR Take 1.5 tablets (30 mg total) by mouth every evening. Please have either primary care or cardiologist provide future refills.  Objective:    BP 136/77   Pulse (!) 50   Temp (!) 96.7 F (35.9 C) (Oral)   Ht 4' 11.75" (1.518 m)   Wt 114 lb (51.7 kg)   BMI 22.45 kg/m   Allergies  Allergen Reactions  . Sulfa Antibiotics Swelling    Wt Readings from Last 3 Encounters:  05/23/18 114 lb (51.7 kg)  03/21/18 113 lb (51.3 kg)  03/20/18 112 lb 9.6 oz (51.1 kg)    Physical Exam Constitutional:      Appearance: She is well-developed.  HENT:     Head: Normocephalic and atraumatic.  Eyes:     Conjunctiva/sclera: Conjunctivae normal.     Pupils: Pupils are equal, round, and reactive to light.  Cardiovascular:     Rate and Rhythm: Normal rate and regular rhythm.     Heart sounds: Normal heart sounds.  Pulmonary:     Effort: Pulmonary effort is normal.     Breath sounds: Normal breath sounds.    Abdominal:     General: Bowel sounds are normal.     Palpations: Abdomen is soft.  Musculoskeletal:     Cervical back: She exhibits decreased range of motion, pain and spasm.       Back:  Skin:    General: Skin is warm and dry.     Findings: No rash.  Neurological:     Mental Status: She is alert and oriented to person, place, and time.     Deep Tendon Reflexes: Reflexes are normal and symmetric.  Psychiatric:        Behavior: Behavior normal.        Thought Content: Thought content normal.        Judgment: Judgment normal.     Results for orders placed or performed in visit on 03/14/18  T4, Free  Result Value Ref Range   Free T4 1.70 0.82 - 1.77 ng/dL  TSH  Result Value Ref Range   TSH 0.131 (L) 0.450 - 4.500 uIU/mL  BMP8+EGFR  Result Value Ref Range   Glucose 100 (H) 65 - 99 mg/dL   BUN 9 8 - 27 mg/dL   Creatinine, Ser 0.61 0.57 - 1.00 mg/dL   GFR calc non Af Amer 87 >59 mL/min/1.73   GFR calc Af Amer 100 >59 mL/min/1.73   BUN/Creatinine Ratio 15 12 - 28   Sodium 143 134 - 144 mmol/L   Potassium 3.6 3.5 - 5.2 mmol/L   Chloride 103 96 - 106 mmol/L   CO2 23 20 - 29 mmol/L   Calcium 9.1 8.7 - 10.3 mg/dL      Assessment & Plan:   1. Closed wedge compression fracture of eleventh thoracic vertebra with routine healing, subsequent encounter - predniSONE (STERAPRED UNI-PAK 48 TAB) 10 MG (48) TBPK tablet; Take as directed for 12 days  Dispense: 48 tablet; Refill: 0 - HYDROcodone-acetaminophen (NORCO) 10-325 MG tablet; Take 1 tablet by mouth every 6 (six) hours as needed.  Dispense: 120 tablet; Refill: 0  2. Cervical neuropathic pain - predniSONE (STERAPRED UNI-PAK 48 TAB) 10 MG (48) TBPK tablet; Take as directed for 12 days  Dispense: 48 tablet; Refill: 0 - HYDROcodone-acetaminophen (NORCO) 10-325 MG tablet; Take 1 tablet by mouth every 6 (six) hours as needed.  Dispense: 120 tablet; Refill: 0   Continue all other maintenance medications as listed above.  Follow up  plan: No follow-ups on file.  Educational handout given for [place] PA[facility] [place]  [address]  [employer], [place] [address] [phone]   05/25/2018, 9:17 PM

## 2018-05-30 NOTE — Telephone Encounter (Signed)
Attempted to contact patient - no call back, this encounter will be closed 

## 2018-06-07 DIAGNOSIS — I10 Essential (primary) hypertension: Secondary | ICD-10-CM | POA: Diagnosis not present

## 2018-06-07 DIAGNOSIS — R0902 Hypoxemia: Secondary | ICD-10-CM | POA: Diagnosis not present

## 2018-06-07 DIAGNOSIS — R0689 Other abnormalities of breathing: Secondary | ICD-10-CM | POA: Diagnosis not present

## 2018-06-07 DIAGNOSIS — Z951 Presence of aortocoronary bypass graft: Secondary | ICD-10-CM | POA: Diagnosis not present

## 2018-06-07 DIAGNOSIS — Z23 Encounter for immunization: Secondary | ICD-10-CM | POA: Diagnosis not present

## 2018-06-07 DIAGNOSIS — R0789 Other chest pain: Secondary | ICD-10-CM | POA: Diagnosis not present

## 2018-06-07 DIAGNOSIS — G459 Transient cerebral ischemic attack, unspecified: Secondary | ICD-10-CM | POA: Diagnosis not present

## 2018-06-07 DIAGNOSIS — R29818 Other symptoms and signs involving the nervous system: Secondary | ICD-10-CM | POA: Diagnosis not present

## 2018-06-07 DIAGNOSIS — H538 Other visual disturbances: Secondary | ICD-10-CM | POA: Diagnosis not present

## 2018-06-07 DIAGNOSIS — I2510 Atherosclerotic heart disease of native coronary artery without angina pectoris: Secondary | ICD-10-CM | POA: Diagnosis not present

## 2018-06-07 DIAGNOSIS — R4182 Altered mental status, unspecified: Secondary | ICD-10-CM | POA: Diagnosis not present

## 2018-06-08 DIAGNOSIS — G459 Transient cerebral ischemic attack, unspecified: Secondary | ICD-10-CM | POA: Diagnosis not present

## 2018-06-08 DIAGNOSIS — I2510 Atherosclerotic heart disease of native coronary artery without angina pectoris: Secondary | ICD-10-CM | POA: Diagnosis not present

## 2018-06-09 ENCOUNTER — Other Ambulatory Visit: Payer: Self-pay | Admitting: Cardiovascular Disease

## 2018-06-14 ENCOUNTER — Encounter (INDEPENDENT_AMBULATORY_CARE_PROVIDER_SITE_OTHER): Payer: Self-pay

## 2018-06-14 ENCOUNTER — Other Ambulatory Visit: Payer: Self-pay

## 2018-06-14 ENCOUNTER — Encounter: Payer: Self-pay | Admitting: Cardiovascular Disease

## 2018-06-14 ENCOUNTER — Ambulatory Visit: Payer: Medicare HMO | Admitting: Cardiovascular Disease

## 2018-06-14 ENCOUNTER — Ambulatory Visit (HOSPITAL_COMMUNITY)
Admission: RE | Admit: 2018-06-14 | Discharge: 2018-06-14 | Disposition: A | Discharge status: Home / Self Care | Payer: Medicare HMO | Source: Other Acute Inpatient Hospital | Attending: Cardiovascular Disease | Admitting: Cardiovascular Disease

## 2018-06-14 ENCOUNTER — Encounter (HOSPITAL_COMMUNITY)
Admission: RE | Disposition: A | Discharge status: Home / Self Care | Payer: Self-pay | Attending: Cardiovascular Disease

## 2018-06-14 VITALS — BP 130/60 | HR 63 | Ht 62.0 in | Wt 115.4 lb

## 2018-06-14 DIAGNOSIS — E039 Hypothyroidism, unspecified: Secondary | ICD-10-CM | POA: Insufficient documentation

## 2018-06-14 DIAGNOSIS — I712 Thoracic aortic aneurysm, without rupture: Secondary | ICD-10-CM | POA: Insufficient documentation

## 2018-06-14 DIAGNOSIS — I351 Nonrheumatic aortic (valve) insufficiency: Secondary | ICD-10-CM | POA: Insufficient documentation

## 2018-06-14 DIAGNOSIS — Z8249 Family history of ischemic heart disease and other diseases of the circulatory system: Secondary | ICD-10-CM | POA: Insufficient documentation

## 2018-06-14 DIAGNOSIS — Z955 Presence of coronary angioplasty implant and graft: Secondary | ICD-10-CM | POA: Insufficient documentation

## 2018-06-14 DIAGNOSIS — Z79899 Other long term (current) drug therapy: Secondary | ICD-10-CM | POA: Diagnosis not present

## 2018-06-14 DIAGNOSIS — I25810 Atherosclerosis of coronary artery bypass graft(s) without angina pectoris: Secondary | ICD-10-CM | POA: Insufficient documentation

## 2018-06-14 DIAGNOSIS — E785 Hyperlipidemia, unspecified: Secondary | ICD-10-CM | POA: Diagnosis not present

## 2018-06-14 DIAGNOSIS — Z882 Allergy status to sulfonamides status: Secondary | ICD-10-CM | POA: Diagnosis not present

## 2018-06-14 DIAGNOSIS — I10 Essential (primary) hypertension: Secondary | ICD-10-CM | POA: Diagnosis not present

## 2018-06-14 DIAGNOSIS — G451 Carotid artery syndrome (hemispheric): Secondary | ICD-10-CM | POA: Diagnosis not present

## 2018-06-14 DIAGNOSIS — E7800 Pure hypercholesterolemia, unspecified: Secondary | ICD-10-CM | POA: Diagnosis not present

## 2018-06-14 DIAGNOSIS — G459 Transient cerebral ischemic attack, unspecified: Secondary | ICD-10-CM | POA: Diagnosis not present

## 2018-06-14 DIAGNOSIS — Z8673 Personal history of transient ischemic attack (TIA), and cerebral infarction without residual deficits: Secondary | ICD-10-CM | POA: Diagnosis not present

## 2018-06-14 DIAGNOSIS — Z7982 Long term (current) use of aspirin: Secondary | ICD-10-CM | POA: Diagnosis not present

## 2018-06-14 DIAGNOSIS — K219 Gastro-esophageal reflux disease without esophagitis: Secondary | ICD-10-CM | POA: Diagnosis not present

## 2018-06-14 DIAGNOSIS — I25708 Atherosclerosis of coronary artery bypass graft(s), unspecified, with other forms of angina pectoris: Secondary | ICD-10-CM

## 2018-06-14 DIAGNOSIS — I7121 Aneurysm of the ascending aorta, without rupture: Secondary | ICD-10-CM

## 2018-06-14 HISTORY — PX: LOOP RECORDER INSERTION: EP1214

## 2018-06-14 SURGERY — LOOP RECORDER INSERTION

## 2018-06-14 MED ORDER — LIDOCAINE-EPINEPHRINE 1 %-1:100000 IJ SOLN
INTRAMUSCULAR | Status: DC | PRN
  Administered 2018-06-14: 10 mL via INTRADERMAL

## 2018-06-14 SURGICAL SUPPLY — 2 items
LOOP REVEAL LINQSYS (Prosthesis & Implant Heart) ×1 IMPLANT
PACK LOOP INSERTION (CUSTOM PROCEDURE TRAY) ×2 IMPLANT

## 2018-06-14 NOTE — Progress Notes (Signed)
 Cardiology Office Note    Date:  06/14/2018   ID:  Evelyn [name], DOB 11/25/1940, MRN [id number]  PCP:  Jones, Angel S, PA-C  Cardiologist:   Mailen Newborn, MD   Chief Complaint  Patient presents with  . Transient Ischemic Attack    History of Present Illness:  Evelyn [name] is a 78 y.o. female with long-standing history of coronary artery disease with previous stents to LAD followed by bypass surgery (LIMA to LAD, SVG to diagonal, SVG to OM in 2004), hyperlipidemia here for follow-up.  Last week she had an episode of transient expressive aphasia that occurred while she was speaking with her son on the phone ("mom, you are speaking gibberish").  Symptoms like.  She was taken to the emergency room at [facility].  I cannot get access to the records, not sure what work-up was performed, it was told that there was no "lasting damage" on her CT.  She remembers having multiple ECG, but is not certain that she had carotid Dopplers.  She has not had any neurological events since that time.  The week prior to the TIA she had increased frequency of her usual angina pectoris and did feel mild shortness of breath.  This has also improved.  She has a normal rhythm today (normal sinus at 63 bpm).  She had a previous episode suggesting TIA in May 2018.  She had a CT angiogram that did not show any evidence of meaningful extracranial carotid atherosclerosis or stenoses.  She also had an echocardiogram with a bubble study in May 2018 without any evidence of intracardiac shunt.  She had normal carotid ultrasound in 2014.  Angiography in October 2018 showed virtually no change in coronary anatomy since 2013.  She has a chronically occluded LAD artery with multiple occluded stents, but has good downstream blood flow via LIMA-LAD and SVG-diagonal bypasses.  There is probably a significant area of myocardial septum that is ischemic, upstream of the blocked mid LAD.  There was no mention of a SVG  bypass to the oblique marginal branch, but the left circumflex coronary artery was widely patent with no more than 20% stenosis (Chest CTA showed that the SVG-OM is now occluded).  Left ventricular regional wall motion and overall systolic function remain normal and the LVEDP was only 14 mmHg. Although the chest CT angiogram suggested there was a 70% stenosis in the left main coronary artery, that potentially could lead to ischemia in the circumflex territory, this was not found to be the case by invasive angiography.  Had an echocardiogram in May 2018, showing normal LVEF 55-60%, with inferior and inferoseptal mild hypokinesis, mildly dilated ascending aorta, mild aortic insufficiency. In September 2017 her treadmill nuclear stress test showed normal myocardial perfusion and EF 62%. In October 2017 she had the last evaluation of her aorta, showing mild aneurysmal dilation of the ascending aorta at 3.7 cm and of the aortic arch at 3.3 cm. (She is a very small frame lady, 5 foot 2 inches tall, 112 pounds).  CT angiography of the aorta October 2018 showed unchanged aortic root dimension at 3.3 cm.  The SVG to OM was described as occluded.  She has long-standing history of coronary disease, undergoing her first cardiac catheterization in 1999. She received stents to the LAD artery in 2002 and had to have cutting balloon atherectomy for in-stent restenosis in 2003 followed by a new LAD stent in 2004 for disease progression. Ultimately after again developing restenosis she   underwent coronary bypass surgery (LIMA to LAD, SVG to diagonal, SVG to OM in 2004). She had cardiac catheterization in July 2013 and October 2018, without evidence of significant progression. This demonstrated interval total occlusion of the proximal LAD artery although the distant bypasses are all widely patent. On her stress test in 2013 and 2017  the perfusion images were "normal". It is felt that she may have occasional angina pectoris related  to ischemia in the proximal septum since there is no retrograde flow from the bypasses to the proximal LAD. Her last echo in May 2018 showed normal EF 55-60%, but reported inferior and inferoseptal mild hypokinesis. 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  CT angiogram of the head and neck performed in May 2018 for TIA showed mild extracranial atherosclerosis. She has treated systemic hypertension and hyperlipidemia and hypothyroidism.  She had previous cervical spine surgery with bone grafting and discectomy in 2009.   Past Medical History:  Diagnosis Date  . Aneurysm (HCC)   . Aortic insufficiency   . CAD (coronary artery disease)    CABG LIMA to LAD,vein graft to diagonal,vein graft to OM  . Dyslipidemia   . GERD (gastroesophageal reflux disease)   . Hypothyroid     Past Surgical History:  Procedure Laterality Date  . ABDOMINAL HYSTERECTOMY    . BACK SURGERY  2009  . CORONARY ANGIOPLASTY  2003   cutting balloon atherectomy in-stent stenosis of the LAD.  . CORONARY ANGIOPLASTY WITH STENT PLACEMENT  2002   remote PCI & stenting LAD,  . CORONARY ARTERY BYPASS GRAFT  2004   LIMA to LAD,vein graft to diagonal,vein graft to OM  . FOOT SURGERY    . LEFT HEART CATH AND CORS/GRAFTS ANGIOGRAPHY N/A 02/23/2017   Procedure: LEFT HEART CATH AND CORS/GRAFTS ANGIOGRAPHY;  Surgeon: Arida, Muhammad A, MD;  Location: [facility];  Service: Cardiovascular;  Laterality: N/A;  . LEFT HEART CATHETERIZATION WITH CORONARY ANGIOGRAM N/A 12/15/2011   Procedure: LEFT HEART CATHETERIZATION WITH CORONARY ANGIOGRAM;  Surgeon: Jonanthan Bolender, MD;  Location: [facility] CATH [facility];  Service: Cardiovascular;  Laterality: N/A;  . NECK SURGERY    . NM MYOCAR PERF WALL MOTION  12/01/2011   normal study-persistent exercise induced ECG changes raises the concern for ischemia (balanced ischemia).  . NOSE SURGERY    . TONSILLECTOMY  1948  . TUBAL LIGATION       Current Medications: Outpatient Medications Prior to Visit  Medication Sig Dispense Refill  . ALPRAZolam (XANAX) 0.25 MG tablet Take 1 tablet (0.25 mg total) by mouth 3 (three) times daily as needed for anxiety. 30 tablet 5  . aspirin 325 MG tablet Take 325 mg by mouth daily.    . furosemide (LASIX) 20 MG tablet Take 1 tablet (20 mg total) by mouth daily. 90 tablet 3  . HYDROcodone-acetaminophen (NORCO) 10-325 MG tablet Take 1 tablet by mouth every 6 (six) hours as needed. 120 tablet 0  . hydrocortisone 2.5 % cream APPLY TO AFFECTED AREA TWICE A DAY 28.35 g 2  . isosorbide mononitrate (IMDUR) 60 MG 24 hr tablet TAKE 1 TABLET (60 MG TOTAL) DAILY BY MOUTH. 90 tablet 0  . levothyroxine (SYNTHROID, LEVOTHROID) 75 MCG tablet Take 1 tablet (75 mcg total) by mouth daily before breakfast. 30 tablet 6  . loratadine (CLARITIN) 10 MG tablet TAKE 1 TABLET BY MOUTH EVERY DAY 90 tablet 0  . metoprolol tartrate (LOPRESSOR) 100 MG tablet Take 1   tablet (100 mg total) by mouth 2 (two) times daily. 60 tablet 11  . nitroGLYCERIN (NITROSTAT) 0.4 MG SL tablet PLACE 1 TABLET (0.4 MG TOTAL) UNDER THE TONGUE EVERY 5 (FIVE) MINUTES AS NEEDED FOR CHEST PAIN. 25 tablet 0  . rosuvastatin (CRESTOR) 20 MG tablet Take 1.5 tablets (30 mg total) by mouth every evening. Please have either primary care or cardiologist provide future refills. 45 tablet 5  . Zoledronic Acid (RECLAST IV) Inject into the vein. Once yearly    . baclofen (LIORESAL) 10 MG tablet Take 1 tablet (10 mg total) by mouth 3 (three) times daily. 30 each 1  . predniSONE (STERAPRED UNI-PAK 48 TAB) 10 MG (48) TBPK tablet Take as directed for 12 days 48 tablet 0   No facility-administered medications prior to visit.      Allergies:   Sulfa antibiotics   Social History   Socioeconomic History  . Marital status: Married    Spouse name: Not on file  . Number of children: 2  . Years of education: Associates  . Highest education level: Associate degree:  academic program  Occupational History  . Occupation: Retired  Social Needs  . Financial resource strain: Not hard at all  . Food insecurity:    Worry: Never true    Inability: Never true  . Transportation needs:    Medical: No    Non-medical: No  Tobacco Use  . Smoking status: Never Smoker  . Smokeless tobacco: Never Used  Substance and Sexual Activity  . Alcohol use: No  . Drug use: No  . Sexual activity: Not Currently  Lifestyle  . Physical activity:    Days per week: 7 days    Minutes per session: 30 min  . Stress: Not at all  Relationships  . Social connections:    Talks on phone: More than three times a week    Gets together: More than three times a week    Attends religious service: More than 4 times per year    Active member of club or organization: Yes    Attends meetings of clubs or organizations: More than 4 times per year    Relationship status: Widowed  Other Topics Concern  . Not on file  Social History Narrative   Mother of 2 with 4 grandchildren   Right-handed   Caffeine: 8-10 cups coffee     Family History:  The patient's family history includes Asthma in her maternal grandmother; Cancer (age of onset: 76) in her sister; Heart attack in her mother; Heart failure in her father; Hypertension in her father; Stroke in her brother and daughter; Voice disorder in her daughter.   ROS:   Please see the history of present illness.    ROS All other systems reviewed and are negative.   PHYSICAL EXAM:   VS:  BP 130/60   Pulse 63   Ht 5' 2" (1.575 m)   Wt 115 lb 6.4 oz (52.3 kg)   BMI 21.11 kg/m      General: Alert, oriented x3, no distress, very lean, appears fit Head: no evidence of trauma, PERRL, EOMI, no exophtalmos or lid lag, no myxedema, no xanthelasma; normal ears, nose and oropharynx Neck: normal jugular venous pulsations and no hepatojugular reflux; brisk carotid pulses without delay and no carotid bruits Chest: clear to auscultation, no signs  of consolidation by percussion or palpation, normal fremitus, symmetrical and full respiratory excursions Cardiovascular: normal position and quality of the apical impulse, regular rhythm, normal first   and second heart sounds, no murmurs, rubs or gallops Abdomen: no tenderness or distention, no masses by palpation, no abnormal pulsatility or arterial bruits, normal bowel sounds, no hepatosplenomegaly Extremities: no clubbing, cyanosis or edema; 2+ radial, ulnar and brachial pulses bilaterally; 2+ right femoral, posterior tibial and dorsalis pedis pulses; 2+ left femoral, posterior tibial and dorsalis pedis pulses; no subclavian or femoral bruits Neurological: grossly nonfocal Psych: Normal mood and affect   Wt Readings from Last 3 Encounters:  06/14/18 115 lb 6.4 oz (52.3 kg)  05/23/18 114 lb (51.7 kg)  03/21/18 113 lb (51.3 kg)      Studies/Labs Reviewed:   EKG:  EKG is ordered today.  I it shows normal sinus rhythm, nonspecific T wave changes (subtle T wave inversion V2-V3, unchanged from previous tracings), QTC 411 seconds. 03/14/2018: BUN 9; Creatinine, Ser 0.61; Potassium 3.6; Sodium 143; TSH 0.131   Lipid Panel    Component Value Date/Time   CHOL 157 07/21/2016 0935   TRIG 76 07/21/2016 0935   HDL 56 07/21/2016 0935   CHOLHDL 2.8 07/21/2016 0935   CHOLHDL 2.0 07/11/2014 0913   VLDL 12 07/11/2014 0913   LDLCALC 86 07/21/2016 0935     ASSESSMENT:    1. Hemispheric carotid artery syndrome   2. Ascending aortic aneurysm (HCC)   3. Coronary artery disease of bypass graft of native heart with stable angina pectoris (HCC)   4. Essential hypertension   5. Hypercholesterolemia   6. Nonrheumatic aortic (valve) insufficiency      PLAN:  In order of problems listed above:  1. CAD s/p CABG: Continues to have infrequent episodes of angina pectoris, CCS class I-2 probably related to ischemia in the proximal septum downstream of the occluded LAD artery but upstream of the LIMA  supplied territory.  Also has an occluded SVG to the oblique marginal artery, although the native circumflex artery is patent.  Essential hypertension generally well controlled on 2 antianginal medications.  Had some increase in angina the week before and I wonder whether that could have been due to atrial fibrillation?   2. TIA: I think it is imperative that we exclude paroxysmal atrial fibrillation since this is the second time she has had a transient neurological event in the last 2 years.  She actually states that she had 2 other brief episodes that she did not seek medical attention for.  I recommended implantation of a loop recorder and she agrees to go ahead with it.This procedure has been fully reviewed with the patient and written informed consent has been obtained.  She is currently taking aspirin 325 mg once daily, which is appropriate because of the recent neurological event.  However, if we identify atrial fibrillation we will switch to a direct oral anticoagulant such as Eliquis or Xarelto. 3. Asc Ao Aneurysm: Very mild, stable in size on previous sequential studies.  I do not think she requires serial studies to evaluate this. 4. HTN: Well-controlled on current medications 5. HLP: On maximum dose rosuvastatin.  Ideally would treat to LDL cholesterol less than 70.  LDL is not at target less than 70, but is much much better than her baseline, well over 50% reduction.  There has been no progression in native coronary artery disease over the last 5 years.  We have talked about adding PCSK9 inhibitors or ezetimibe but she is reluctant due to the extra cost. 6. 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  Asymptomatic.   Medication Adjustments/Labs   and Tests Ordered: Current medicines are reviewed at length with the patient today.  Concerns regarding medicines are outlined above.  Medication changes, Labs and Tests ordered today are listed in the Patient  Instructions below. Patient Instructions  Implantable Loop Recorder Placement  An implantable loop recorder is a small electronic device that is placed under the skin of your chest. It is about the size of an AA ("double A") battery. The device records the electrical activity of your heart over a long period of time. Your health care provider can download these recordings to monitor your heart. You may need an implantable loop recorder if you have periods of abnormal heart activity (arrhythmias) or unexplained fainting (syncope). The recorder can be left in place for 1 year or longer. Tell a health care provider about:  Any allergies you have.  All medicines you are taking, including vitamins, herbs, eye drops, creams, and over-the-counter medicines.  Any problems you or family members have had with anesthetic medicines.  Any blood disorders you have.  Any surgeries you have had.  Any medical conditions you have.  Whether you are pregnant or may be pregnant. What are the risks? Generally, this is a safe procedure. However, problems may occur, including:  Infection.  Bleeding.  Allergic reactions to anesthetic medicines.  Damage to nerves or blood vessels.  Failure of the device to work. This could require another surgery to replace it. What happens before the procedure?   You may have a physical exam, blood tests, and imaging tests of your heart, such as a chest X-ray.  Follow instructions from your health care provider about eating or drinking restrictions.  Ask your health care provider about: ? Changing or stopping your regular medicines. This is especially important if you are taking diabetes medicines or blood thinners. ? Taking medicines such as aspirin and ibuprofen. These medicines can thin your blood. Do not take these medicines unless your health care provider tells you to take them. ? Taking over-the-counter medicines, vitamins, herbs, and supplements.  Ask  your health care provider how your surgical site will be marked or identified.  Ask your health care provider what steps will be taken to help prevent infection. These may include: ? Removing hair at the surgery site. ? Washing skin with a germ-killing soap.  Plan to have someone take you home from the hospital or clinic.  Plan to have a responsible adult care for you for at least 24 hours after you leave the hospital or clinic. This is important.  Do not use any products that contain nicotine or tobacco, such as cigarettes and e-cigarettes. If you need help quitting, ask your health care provider. What happens during the procedure?  An IV will be inserted into one of your veins.  You may be given one or more of the following: ? A medicine to help you relax (sedative). ? A medicine to numb the area (local anesthetic).  A small incision will be made on the left side of your upper chest.  A pocket will be created under your skin.  The device will be placed in the pocket.  The incision will be closed with stitches (sutures) or adhesive strips.  A bandage (dressing) will be placed over the incision. The procedure may vary among health care providers and hospitals. What happens after the procedure?  Your blood pressure, heart rate, breathing rate, and blood oxygen level will be monitored until you leave the hospital or clinic.  You may be able   to go home on the day of your surgery. Before you go home: ? Your health care provider will program your recorder. ? You will learn how to trigger your device with a handheld activator. ? You will learn how to send recordings to your health care provider. ? You will get an ID card for your device, and you will be told when to use it.  Do not drive for 24 hours if you were given a sedative during your procedure. Summary  An implantable loop recorder is a small electronic device that is placed under the skin of your chest to monitor your  heart over a long period of time.  The recorder can be left in place for 1 year or longer.  Plan to have someone take you home from the hospital or clinic. This information is not intended to replace advice given to you by your health care provider. Make sure you discuss any questions you have with your health care provider. Document Released: 04/14/2015 Document Revised: 06/18/2017 Document Reviewed: 06/18/2017 Elsevier Interactive Patient Education  2019 [employer]  WOUND CHECK IN 2 WEEKS AT [address]  Your physician recommends that you schedule a follow-up appointment in: 6 MONTHS WITH DR Everlyn Farabaugh     Signed, Riven Beebe, MD  06/14/2018 11:38 AM    [facility] [address], [place], [place]  [address] Phone: [phone]; Fax: [phone]   

## 2018-06-14 NOTE — Op Note (Signed)
LOOP RECORDER IMPLANT   Procedure report  Procedure performed:  Loop recorder implantation   Reason for procedure:  1. Cryptogenic TIA/stroke Procedure performed by:  Thurmon Fair, MD  Complications:  None  Estimated blood loss:  <5 mL  Medications administered during procedure:  Lidocaine 1% with 1/10,000 epinephrine 10 mL locally Device details:  Medtronic Reveal Linq model number [id number], serial number [id number] Procedure details:  After the risks and benefits of the procedure were discussed the patient provided informed consent. The patient was prepped and draped in usual sterile fashion. Local anesthesia was administered to an area 2 cm to the left of the sternum in the 4th intercostal space. A cutaneous incision was made using the incision tool. The introducer was then used to create a subcutaneous tunnel and carefully deploy the device. Local pressure was held to ensure hemostasis.  The incision was closed with SteriStrips and a sterile dressing was applied.   Thurmon Fair, MD, [facility] [phone] office [phone] pager 06/14/2018 2:22 PM

## 2018-06-14 NOTE — Interval H&P Note (Signed)
History and Physical Interval Note:  06/14/2018 1:37 PM  Evelyn [name]  has presented today for surgery, with the diagnosis of TIA  The various methods of treatment have been discussed with the patient and family. After consideration of risks, benefits and other options for treatment, the patient has consented to  Procedure(s): LOOP RECORDER INSERTION (N/A) as a surgical intervention .  The patient's history has been reviewed, patient examined, no change in status, stable for surgery.  I have reviewed the patient's chart and labs.  Questions were answered to the patient's satisfaction.     Dhruvan Gullion

## 2018-06-14 NOTE — Discharge Instructions (Signed)
SEE ADDITIONAL DISCHARGE PAPER THAT WAS GIVEN TO THE PATIENT

## 2018-06-14 NOTE — Patient Instructions (Signed)
Implantable Loop Recorder Placement  An implantable loop recorder is a small electronic device that is placed under the skin of your chest. It is about the size of an AA ("double A") battery. The device records the electrical activity of your heart over a long period of time. Your health care provider can download these recordings to monitor your heart. You may need an implantable loop recorder if you have periods of abnormal heart activity (arrhythmias) or unexplained fainting (syncope). The recorder can be left in place for 1 year or longer. Tell a health care provider about:  Any allergies you have.  All medicines you are taking, including vitamins, herbs, eye drops, creams, and over-the-counter medicines.  Any problems you or family members have had with anesthetic medicines.  Any blood disorders you have.  Any surgeries you have had.  Any medical conditions you have.  Whether you are pregnant or may be pregnant. What are the risks? Generally, this is a safe procedure. However, problems may occur, including:  Infection.  Bleeding.  Allergic reactions to anesthetic medicines.  Damage to nerves or blood vessels.  Failure of the device to work. This could require another surgery to replace it. What happens before the procedure?   You may have a physical exam, blood tests, and imaging tests of your heart, such as a chest X-ray.  Follow instructions from your health care provider about eating or drinking restrictions.  Ask your health care provider about: ? Changing or stopping your regular medicines. This is especially important if you are taking diabetes medicines or blood thinners. ? Taking medicines such as aspirin and ibuprofen. These medicines can thin your blood. Do not take these medicines unless your health care provider tells you to take them. ? Taking over-the-counter medicines, vitamins, herbs, and supplements.  Ask your health care provider how your surgical  site will be marked or identified.  Ask your health care provider what steps will be taken to help prevent infection. These may include: ? Removing hair at the surgery site. ? Washing skin with a germ-killing soap.  Plan to have someone take you home from the hospital or clinic.  Plan to have a responsible adult care for you for at least 24 hours after you leave the hospital or clinic. This is important.  Do not use any products that contain nicotine or tobacco, such as cigarettes and e-cigarettes. If you need help quitting, ask your health care provider. What happens during the procedure?  An IV will be inserted into one of your veins.  You may be given one or more of the following: ? A medicine to help you relax (sedative). ? A medicine to numb the area (local anesthetic).  A small incision will be made on the left side of your upper chest.  A pocket will be created under your skin.  The device will be placed in the pocket.  The incision will be closed with stitches (sutures) or adhesive strips.  A bandage (dressing) will be placed over the incision. The procedure may vary among health care providers and hospitals. What happens after the procedure?  Your blood pressure, heart rate, breathing rate, and blood oxygen level will be monitored until you leave the hospital or clinic.  You may be able to go home on the day of your surgery. Before you go home: ? Your health care provider will program your recorder. ? You will learn how to trigger your device with a handheld activator. ? You will learn  how to send recordings to your health care provider. ? You will get an ID card for your device, and you will be told when to use it.  Do not drive for 24 hours if you were given a sedative during your procedure. Summary  An implantable loop recorder is a small electronic device that is placed under the skin of your chest to monitor your heart over a long period of time.  The  recorder can be left in place for 1 year or longer.  Plan to have someone take you home from the hospital or clinic. This information is not intended to replace advice given to you by your health care provider. Make sure you discuss any questions you have with your health care provider. Document Released: 04/14/2015 Document Revised: 06/18/2017 Document Reviewed: 06/18/2017 Elsevier Interactive Patient Education  2019 Elsevier Inc.  WOUND CHECK IN 2 WEEKS AT [address]  Your physician recommends that you schedule a follow-up appointment in: 6 MONTHS WITH DR Royann Shivers

## 2018-06-14 NOTE — H&P (View-Only) (Signed)
Cardiology Office Note    Date:  06/14/2018   ID:  Evelyn LacyShirley H [name], DOB 03/13/1941, MRN [id number]  PCP:  Remus LofflerJones, Angel S, PA-C  Cardiologist:   Thurmon FairMihai Bita Cartwright, MD   Chief Complaint  Patient presents with  . Transient Ischemic Attack    History of Present Illness:  Evelyn [name] is a 78 y.o. female with long-standing history of coronary artery disease with previous stents to LAD followed by bypass surgery (LIMA to LAD, SVG to diagonal, SVG to OM in 2004), hyperlipidemia here for follow-up.  Last week she had an episode of transient expressive aphasia that occurred while she was speaking with her son on the phone ("mom, you are speaking gibberish").  Symptoms like.  [facility]  I cannot get access to the records, not sure what work-up was performed, it was told that there was no "lasting damage" on her CT.  She remembers having multiple ECG, but is not certain that she had carotid Dopplers.  She has not had any neurological events since that time.  The week prior to the TIA she had increased frequency of her usual angina pectoris and did feel mild shortness of breath.  This has also improved.  She has a normal rhythm today (normal sinus at 63 bpm).  She had a previous episode suggesting TIA in May 2018.  She had a CT angiogram that did not show any evidence of meaningful extracranial carotid atherosclerosis or stenoses.  She also had an echocardiogram with a bubble study in May 2018 without any evidence of intracardiac shunt.  She had normal carotid ultrasound in 2014.  Angiography in October 2018 showed virtually no change in coronary anatomy since 2013.  She has a chronically occluded LAD artery with multiple occluded stents, but has good downstream blood flow via LIMA-LAD and SVG-diagonal bypasses.  There is probably a significant area of myocardial septum that is ischemic, upstream of the blocked mid LAD.  There was no mention of a SVG  bypass to the oblique marginal branch, but the left circumflex coronary artery was widely patent with no more than 20% stenosis (Chest CTA showed that the SVG-OM is now occluded).  Left ventricular regional wall motion and overall systolic function remain normal and the LVEDP was only 14 mmHg. Although the chest CT angiogram suggested there was a 70% stenosis in the left main coronary artery, that potentially could lead to ischemia in the circumflex territory, this was not found to be the case by invasive angiography.  Had an echocardiogram in May 2018, showing normal LVEF 55-60%, with inferior and inferoseptal mild hypokinesis, mildly dilated ascending aorta, mild aortic insufficiency. In September 2017 her treadmill nuclear stress test showed normal myocardial perfusion and EF 62%. In October 2017 she had the last evaluation of her aorta, showing mild aneurysmal dilation of the ascending aorta at 3.7 cm and of the aortic arch at 3.3 cm. (She is a very small frame lady, 5 foot 2 inches tall, 112 pounds).  CT angiography of the aorta October 2018 showed unchanged aortic root dimension at 3.3 cm.  The SVG to OM was described as occluded.  She has long-standing history of coronary disease, undergoing her first cardiac catheterization in 1999. She received stents to the LAD artery in 2002 and had to have cutting balloon atherectomy for in-stent restenosis in 2003 followed by a new LAD stent in 2004 for disease progression. Ultimately after again developing restenosis she  underwent coronary bypass surgery (LIMA to LAD, SVG to diagonal, SVG to OM in 2004). She had cardiac catheterization in July 2013 and October 2018, without evidence of significant progression. This demonstrated interval total occlusion of the proximal LAD artery although the distant bypasses are all widely patent. On her stress test in 2013 and 2017  the perfusion images were "normal". It is felt that she may have occasional angina pectoris related  to ischemia in the proximal septum since there is no retrograde flow from the bypasses to the proximal LAD. Her last echo in May 2018 showed normal EF 55-60%, but reported inferior and inferoseptal mild hypokinesis. 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  CT angiogram of the head and neck performed in May 2018 for TIA showed mild extracranial atherosclerosis. She has treated systemic hypertension and hyperlipidemia and hypothyroidism.  She had previous cervical spine surgery with bone grafting and discectomy in 2009.   Past Medical History:  Diagnosis Date  . Aneurysm (HCC)   . Aortic insufficiency   . CAD (coronary artery disease)    CABG LIMA to LAD,vein graft to diagonal,vein graft to OM  . Dyslipidemia   . GERD (gastroesophageal reflux disease)   . Hypothyroid     Past Surgical History:  Procedure Laterality Date  . ABDOMINAL HYSTERECTOMY    . BACK SURGERY  2009  . CORONARY ANGIOPLASTY  2003   cutting balloon atherectomy in-stent stenosis of the LAD.  [name] CORONARY ANGIOPLASTY WITH STENT PLACEMENT  2002   remote PCI & stenting LAD,  . CORONARY ARTERY BYPASS GRAFT  2004   LIMA to LAD,vein graft to diagonal,vein graft to OM  . FOOT SURGERY    . LEFT HEART CATH AND CORS/GRAFTS ANGIOGRAPHY N/A 02/23/2017   Procedure: LEFT HEART CATH AND CORS/GRAFTS ANGIOGRAPHY;  Surgeon: Iran Ouch, MD;  Location: [facility];  Service: Cardiovascular;  Laterality: N/A;  . LEFT HEART CATHETERIZATION WITH CORONARY ANGIOGRAM N/A 12/15/2011   Procedure: LEFT HEART CATHETERIZATION WITH CORONARY ANGIOGRAM;  Surgeon: Thurmon Fair, MD;  Location: [facility] CATH LAB;  Service: Cardiovascular;  Laterality: N/A;  . NECK SURGERY    . NM MYOCAR PERF WALL MOTION  12/01/2011   normal study-persistent exercise induced ECG changes raises the concern for ischemia (balanced ischemia).  . NOSE SURGERY    . TONSILLECTOMY  1948  . TUBAL LIGATION       Current Medications: Outpatient Medications Prior to Visit  Medication Sig Dispense Refill  . ALPRAZolam (XANAX) 0.25 MG tablet Take 1 tablet (0.25 mg total) by mouth 3 (three) times daily as needed for anxiety. 30 tablet 5  . aspirin 325 MG tablet Take 325 mg by mouth daily.    . furosemide (LASIX) 20 MG tablet Take 1 tablet (20 mg total) by mouth daily. 90 tablet 3  . HYDROcodone-acetaminophen (NORCO) 10-325 MG tablet Take 1 tablet by mouth every 6 (six) hours as needed. 120 tablet 0  . hydrocortisone 2.5 % cream APPLY TO AFFECTED AREA TWICE A DAY 28.35 g 2  . isosorbide mononitrate (IMDUR) 60 MG 24 hr tablet TAKE 1 TABLET (60 MG TOTAL) DAILY BY MOUTH. 90 tablet 0  . levothyroxine (SYNTHROID, LEVOTHROID) 75 MCG tablet Take 1 tablet (75 mcg total) by mouth daily before breakfast. 30 tablet 6  . loratadine (CLARITIN) 10 MG tablet TAKE 1 TABLET BY MOUTH EVERY DAY 90 tablet 0  . metoprolol tartrate (LOPRESSOR) 100 MG tablet Take 1  tablet (100 mg total) by mouth 2 (two) times daily. 60 tablet 11  . nitroGLYCERIN (NITROSTAT) 0.4 MG SL tablet PLACE 1 TABLET (0.4 MG TOTAL) UNDER THE TONGUE EVERY 5 (FIVE) MINUTES AS NEEDED FOR CHEST PAIN. 25 tablet 0  . rosuvastatin (CRESTOR) 20 MG tablet Take 1.5 tablets (30 mg total) by mouth every evening. Please have either primary care or cardiologist provide future refills. 45 tablet 5  . Zoledronic Acid (RECLAST IV) Inject into the vein. Once yearly    . baclofen (LIORESAL) 10 MG tablet Take 1 tablet (10 mg total) by mouth 3 (three) times daily. 30 each 1  . predniSONE (STERAPRED UNI-PAK 48 TAB) 10 MG (48) TBPK tablet Take as directed for 12 days 48 tablet 0   No facility-administered medications prior to visit.      Allergies:   Sulfa antibiotics   Social History   Socioeconomic History  . Marital status: Married    Spouse name: Not on file  . Number of children: 2  . Years of education: Associates  . Highest education level: Associate degree:  academic program  Occupational History  . Occupation: Retired  Engineer, production  . Financial resource strain: Not hard at all  . Food insecurity:    Worry: Never true    Inability: Never true  . Transportation needs:    Medical: No    Non-medical: No  Tobacco Use  . Smoking status: Never Smoker  . Smokeless tobacco: Never Used  Substance and Sexual Activity  . Alcohol use: No  . Drug use: No  . Sexual activity: Not Currently  Lifestyle  . Physical activity:    Days per week: 7 days    Minutes per session: 30 min  . Stress: Not at all  Relationships  . Social connections:    Talks on phone: More than three times a week    Gets together: More than three times a week    Attends religious service: More than 4 times per year    Active member of club or organization: Yes    Attends meetings of clubs or organizations: More than 4 times per year    Relationship status: Widowed  Other Topics Concern  . Not on file  Social History Narrative   Mother of 2 with 4 grandchildren   Right-handed   Caffeine: 8-10 cups coffee     Family History:  The patient's family history includes Asthma in her maternal grandmother; Cancer (age of onset: 51) in her sister; Heart attack in her mother; Heart failure in her father; Hypertension in her father; Stroke in her brother and daughter; Voice disorder in her daughter.   ROS:   Please see the history of present illness.    ROS All other systems reviewed and are negative.   PHYSICAL EXAM:   VS:  BP 130/60   Pulse 63   Ht 5\' 2"  (1.575 m)   Wt 115 lb 6.4 oz (52.3 kg)   BMI 21.11 kg/m      General: Alert, oriented x3, no distress, very lean, appears fit Head: no evidence of trauma, PERRL, EOMI, no exophtalmos or lid lag, no myxedema, no xanthelasma; normal ears, nose and oropharynx Neck: normal jugular venous pulsations and no hepatojugular reflux; brisk carotid pulses without delay and no carotid bruits Chest: clear to auscultation, no signs  of consolidation by percussion or palpation, normal fremitus, symmetrical and full respiratory excursions Cardiovascular: normal position and quality of the apical impulse, regular rhythm, normal first  and second heart sounds, no murmurs, rubs or gallops Abdomen: no tenderness or distention, no masses by palpation, no abnormal pulsatility or arterial bruits, normal bowel sounds, no hepatosplenomegaly Extremities: no clubbing, cyanosis or edema; 2+ radial, ulnar and brachial pulses bilaterally; 2+ right femoral, posterior tibial and dorsalis pedis pulses; 2+ left femoral, posterior tibial and dorsalis pedis pulses; no subclavian or femoral bruits Neurological: grossly nonfocal Psych: Normal mood and affect   Wt Readings from Last 3 Encounters:  06/14/18 115 lb 6.4 oz (52.3 kg)  05/23/18 114 lb (51.7 kg)  03/21/18 113 lb (51.3 kg)      Studies/Labs Reviewed:   EKG:  EKG is ordered today.  I it shows normal sinus rhythm, nonspecific T wave changes (subtle T wave inversion V2-V3, unchanged from previous tracings), QTC 411 seconds. 03/14/2018: BUN 9; Creatinine, Ser 0.61; Potassium 3.6; Sodium 143; TSH 0.131   Lipid Panel    Component Value Date/Time   CHOL 157 07/21/2016 0935   TRIG 76 07/21/2016 0935   HDL 56 07/21/2016 0935   CHOLHDL 2.8 07/21/2016 0935   CHOLHDL 2.0 07/11/2014 0913   VLDL 12 07/11/2014 0913   LDLCALC 86 07/21/2016 0935     ASSESSMENT:    1. Hemispheric carotid artery syndrome   2. Ascending aortic aneurysm (HCC)   3. Coronary artery disease of bypass graft of native heart with stable angina pectoris (HCC)   4. Essential hypertension   5. Hypercholesterolemia   6. Nonrheumatic aortic (valve) insufficiency      PLAN:  In order of problems listed above:  1. CAD s/p CABG: Continues to have infrequent episodes of angina pectoris, CCS class I-2 probably related to ischemia in the proximal septum downstream of the occluded LAD artery but upstream of the LIMA  supplied territory.  Also has an occluded SVG to the oblique marginal artery, although the native circumflex artery is patent.  Essential hypertension generally well controlled on 2 antianginal medications.  Had some increase in angina the week before and I wonder whether that could have been due to atrial fibrillation?   2. TIA: I think it is imperative that we exclude paroxysmal atrial fibrillation since this is the second time she has had a transient neurological event in the last 2 years.  She actually states that she had 2 other brief episodes that she did not seek medical attention for.  I recommended implantation of a loop recorder and she agrees to go ahead with it.This procedure has been fully reviewed with the patient and written informed consent has been obtained.  She is currently taking aspirin 325 mg once daily, which is appropriate because of the recent neurological event.  However, if we identify atrial fibrillation we will switch to a direct oral anticoagulant such as Eliquis or Xarelto. 3. Asc Ao Aneurysm: Very mild, stable in size on previous sequential studies.  I do not think she requires serial studies to evaluate this. 4. HTN: Well-controlled on current medications 5. HLP: On maximum dose rosuvastatin.  Ideally would treat to LDL cholesterol less than 70.  LDL is not at target less than 70, but is much much better than her baseline, well over 50% reduction.  There has been no progression in native coronary artery disease over the last 5 years.  We have talked about adding PCSK9 inhibitors or ezetimibe but she is reluctant due to the extra cost. 6. 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  Asymptomatic.   Medication Adjustments/Labs  and Tests Ordered: Current medicines are reviewed at length with the patient today.  Concerns regarding medicines are outlined above.  Medication changes, Labs and Tests ordered today are listed in the Patient  Instructions below. Patient Instructions  Implantable Loop Recorder Placement  An implantable loop recorder is a small electronic device that is placed under the skin of your chest. It is about the size of an AA ("double A") battery. The device records the electrical activity of your heart over a long period of time. Your health care provider can download these recordings to monitor your heart. You may need an implantable loop recorder if you have periods of abnormal heart activity (arrhythmias) or unexplained fainting (syncope). The recorder can be left in place for 1 year or longer. Tell a health care provider about:  Any allergies you have.  All medicines you are taking, including vitamins, herbs, eye drops, creams, and over-the-counter medicines.  Any problems you or family members have had with anesthetic medicines.  Any blood disorders you have.  Any surgeries you have had.  Any medical conditions you have.  Whether you are pregnant or may be pregnant. What are the risks? Generally, this is a safe procedure. However, problems may occur, including:  Infection.  Bleeding.  Allergic reactions to anesthetic medicines.  Damage to nerves or blood vessels.  Failure of the device to work. This could require another surgery to replace it. What happens before the procedure?   You may have a physical exam, blood tests, and imaging tests of your heart, such as a chest X-ray.  Follow instructions from your health care provider about eating or drinking restrictions.  Ask your health care provider about: ? Changing or stopping your regular medicines. This is especially important if you are taking diabetes medicines or blood thinners. ? Taking medicines such as aspirin and ibuprofen. These medicines can thin your blood. Do not take these medicines unless your health care provider tells you to take them. ? Taking over-the-counter medicines, vitamins, herbs, and supplements.  Ask  your health care provider how your surgical site will be marked or identified.  Ask your health care provider what steps will be taken to help prevent infection. These may include: ? Removing hair at the surgery site. ? Washing skin with a germ-killing soap.  Plan to have someone take you home from the hospital or clinic.  Plan to have a responsible adult care for you for at least 24 hours after you leave the hospital or clinic. This is important.  Do not use any products that contain nicotine or tobacco, such as cigarettes and e-cigarettes. If you need help quitting, ask your health care provider. What happens during the procedure?  An IV will be inserted into one of your veins.  You may be given one or more of the following: ? A medicine to help you relax (sedative). ? A medicine to numb the area (local anesthetic).  A small incision will be made on the left side of your upper chest.  A pocket will be created under your skin.  The device will be placed in the pocket.  The incision will be closed with stitches (sutures) or adhesive strips.  A bandage (dressing) will be placed over the incision. The procedure may vary among health care providers and hospitals. What happens after the procedure?  Your blood pressure, heart rate, breathing rate, and blood oxygen level will be monitored until you leave the hospital or clinic.  You may be able  to go home on the day of your surgery. Before you go home: ? Your health care provider will program your recorder. ? You will learn how to trigger your device with a handheld activator. ? You will learn how to send recordings to your health care provider. ? You will get an ID card for your device, and you will be told when to use it.  Do not drive for 24 hours if you were given a sedative during your procedure. Summary  An implantable loop recorder is a small electronic device that is placed under the skin of your chest to monitor your  heart over a long period of time.  The recorder can be left in place for 1 year or longer.  Plan to have someone take you home from the hospital or clinic. This information is not intended to replace advice given to you by your health care provider. Make sure you discuss any questions you have with your health care provider. Document Released: 04/14/2015 Document Revised: 06/18/2017 Document Reviewed: 06/18/2017 Elsevier Interactive Patient Education  2019 [employer].  WOUND CHECK IN 2 WEEKS AT [address]  Your physician recommends that you schedule a follow-up appointment in: 6 MONTHS WITH DR Lenard Forth, MD  06/14/2018 11:38 AM    [facility] [address] [place], [place], [place]  [address] Phone: [phone]; Fax: [phone]

## 2018-06-15 ENCOUNTER — Encounter (HOSPITAL_COMMUNITY): Payer: Self-pay | Admitting: Cardiovascular Disease

## 2018-06-16 ENCOUNTER — Telehealth: Payer: Self-pay | Admitting: Cardiovascular Disease

## 2018-06-16 ENCOUNTER — Telehealth: Payer: Self-pay

## 2018-06-16 NOTE — Telephone Encounter (Signed)
06/16/2018 Received records on Rito Ehrlich from [facility] that was requested by Dr. Royann Shivers.  Records was put in [employer].  cbr

## 2018-06-19 ENCOUNTER — Telehealth: Payer: Self-pay | Admitting: *Deleted

## 2018-06-19 ENCOUNTER — Ambulatory Visit: Payer: Medicare HMO | Admitting: Physician Assistant

## 2018-06-19 ENCOUNTER — Encounter: Payer: Self-pay | Admitting: Physician Assistant

## 2018-06-19 DIAGNOSIS — M5412 Radiculopathy, cervical region: Secondary | ICD-10-CM

## 2018-06-19 DIAGNOSIS — S22080D Wedge compression fracture of T11-T12 vertebra, subsequent encounter for fracture with routine healing: Secondary | ICD-10-CM | POA: Diagnosis not present

## 2018-06-19 MED ORDER — HYDROCODONE-ACETAMINOPHEN 5-325 MG PO TABS: 1.0000 | ORAL_TABLET | Freq: Four times a day (QID) | ORAL | 0 refills | Status: DC | PRN

## 2018-06-19 NOTE — Telephone Encounter (Signed)
I agree that is AFib with RVR , despite 100 mg BID metoprolol! I called and explained the need for anticoagulation and better rate control. Will plan the following:  -Eliquis 2,5 mg BID (small body habitus, will be 78 yo next month) - stop aspirin - digoxin 0125 mg daily - continue metoprolol. - follow up w APP 4-6 weeks.  Thanks, Irving Burton! Elnita Maxwell, could you please send the Rx to [facility]? Thanks  [employer]

## 2018-06-19 NOTE — Telephone Encounter (Signed)
Received alert for 2 "AF" episodes and 6 "tachy" episodes. Available ECGs appear true AF. Message routed to Dr. Royann Shivers for recommendations

## 2018-06-20 MED ORDER — APIXABAN 2.5 MG PO TABS: 2.5000 mg | ORAL_TABLET | Freq: Two times a day (BID) | ORAL | 6 refills | Status: DC

## 2018-06-20 NOTE — Progress Notes (Signed)
   BP (!) 146/82   Pulse 63   Temp (!) 96.7 F (35.9 C) (Oral)   Ht 5' 2" (1.575 m)   Wt 114 lb 9.6 oz (52 kg)   BMI 20.96 kg/m    Subjective:    Patient ID: Evelyn [name], female    DOB: 08/26/1940, 78 y.o.   MRN: [id number]  HPI: Evelyn [name] is a 78 y.o. female presenting on 06/19/2018 for Hospitalization Follow-up This patient comes in for hospital follow-up from a TIA that she has had.  Her son took her to the hospital.  Her scans have cleared up.  She has had echocardiogram and it was clear.  She has not had any other significant issues.  She denies any chest pain.  There is no nausea vomiting or diarrhea.  She also needs refills on her chronic pain medications. No red flags on the registry.   Past Medical History:  Diagnosis Date  . Aneurysm (HCC)   . Aortic insufficiency   . CAD (coronary artery disease)    CABG LIMA to LAD,vein graft to diagonal,vein graft to OM  . Dyslipidemia   . GERD (gastroesophageal reflux disease)   . Hypothyroid    Relevant past medical, surgical, family and social history reviewed and updated as indicated. Interim medical history since our last visit reviewed. Allergies and medications reviewed and updated. DATA REVIEWED: CHART IN EPIC  Family History reviewed for pertinent findings.  Review of Systems  Constitutional: Negative.   HENT: Negative.   Eyes: Negative.   Respiratory: Negative.   Gastrointestinal: Negative.   Genitourinary: Negative.     Allergies as of 06/19/2018      Reactions   Sulfa Antibiotics Swelling      Medication List       Accurate as of June 19, 2018 11:59 PM. Always use your most recent med list.        ALPRAZolam 0.25 MG tablet Commonly known as:  XANAX Take 1 tablet (0.25 mg total) by mouth 3 (three) times daily as needed for anxiety.   apixaban 2.5 MG Tabs tablet Commonly known as:  ELIQUIS Take 1 tablet (2.5 mg total) by mouth 2 (two) times daily.   aspirin 325 MG tablet Take 325  mg by mouth daily.   furosemide 20 MG tablet Commonly known as:  LASIX Take 1 tablet (20 mg total) by mouth daily.   HYDROcodone-acetaminophen 5-325 MG tablet Commonly known as:  NORCO Take 1 tablet by mouth every 6 (six) hours as needed for moderate pain.   HYDROcodone-acetaminophen 5-325 MG tablet Commonly known as:  NORCO Take 1 tablet by mouth every 6 (six) hours as needed for moderate pain.   HYDROcodone-acetaminophen 5-325 MG tablet Commonly known as:  NORCO Take 1 tablet by mouth every 6 (six) hours as needed for moderate pain.   hydrocortisone 2.5 % cream APPLY TO AFFECTED AREA TWICE A DAY   isosorbide mononitrate 60 MG 24 hr tablet Commonly known as:  IMDUR TAKE 1 TABLET (60 MG TOTAL) DAILY BY MOUTH.   levothyroxine 75 MCG tablet Commonly known as:  SYNTHROID, LEVOTHROID Take 1 tablet (75 mcg total) by mouth daily before breakfast.   loratadine 10 MG tablet Commonly known as:  CLARITIN TAKE 1 TABLET BY MOUTH EVERY DAY   metoprolol tartrate 100 MG tablet Commonly known as:  LOPRESSOR Take 1 tablet (100 mg total) by mouth 2 (two) times daily.   nitroGLYCERIN 0.4 MG SL tablet Commonly known as:  NITROSTAT   PLACE 1 TABLET (0.4 MG TOTAL) UNDER THE TONGUE EVERY 5 (FIVE) MINUTES AS NEEDED FOR CHEST PAIN.   RECLAST IV Inject into the vein. Once yearly   rosuvastatin 20 MG tablet Commonly known as:  CRESTOR Take 1.5 tablets (30 mg total) by mouth every evening. Please have either primary care or cardiologist provide future refills.          Objective:    BP (!) 146/82   Pulse 63   Temp (!) 96.7 F (35.9 C) (Oral)   Ht 5' 2" (1.575 m)   Wt 114 lb 9.6 oz (52 kg)   BMI 20.96 kg/m   Allergies  Allergen Reactions  . Sulfa Antibiotics Swelling    Wt Readings from Last 3 Encounters:  06/19/18 114 lb 9.6 oz (52 kg)  06/14/18 115 lb 6.4 oz (52.3 kg)  05/23/18 114 lb (51.7 kg)    Physical Exam Constitutional:      Appearance: She is well-developed.    HENT:     Head: Normocephalic and atraumatic.  Eyes:     Conjunctiva/sclera: Conjunctivae normal.     Pupils: Pupils are equal, round, and reactive to light.  Cardiovascular:     Rate and Rhythm: Normal rate and regular rhythm.     Heart sounds: Normal heart sounds.  Pulmonary:     Effort: Pulmonary effort is normal.     Breath sounds: Normal breath sounds.  Abdominal:     General: Bowel sounds are normal.     Palpations: Abdomen is soft.  Skin:    General: Skin is warm and dry.     Findings: No rash.  Neurological:     Mental Status: She is alert and oriented to person, place, and time.     Deep Tendon Reflexes: Reflexes are normal and symmetric.  Psychiatric:        Behavior: Behavior normal.        Thought Content: Thought content normal.        Judgment: Judgment normal.     Results for orders placed or performed in visit on 03/14/18  T4, Free  Result Value Ref Range   Free T4 1.70 0.82 - 1.77 ng/dL  TSH  Result Value Ref Range   TSH 0.131 (L) 0.450 - 4.500 uIU/mL  BMP8+EGFR  Result Value Ref Range   Glucose 100 (H) 65 - 99 mg/dL   BUN 9 8 - 27 mg/dL   Creatinine, Ser 0.61 0.57 - 1.00 mg/dL   GFR calc non Af Amer 87 >59 mL/min/1.73   GFR calc Af Amer 100 >59 mL/min/1.73   BUN/Creatinine Ratio 15 12 - 28   Sodium 143 134 - 144 mmol/L   Potassium 3.6 3.5 - 5.2 mmol/L   Chloride 103 96 - 106 mmol/L   CO2 23 20 - 29 mmol/L   Calcium 9.1 8.7 - 10.3 mg/dL      Assessment & Plan:   1. Closed wedge compression fracture of eleventh thoracic vertebra with routine healing, subsequent encounter Follow as needed  2. Cervical neuropathic pain Refill medications   Continue all other maintenance medications as listed above.  Follow up plan: No follow-ups on file.  Educational handout given for survey  Angel S. Jones PA-C [facility] [address]  [place],  [address] [phone]   06/20/2018, 10:00 PM  

## 2018-06-20 NOTE — Telephone Encounter (Signed)
Spoke to patient Dr.Croitoru's recommendations given.Advised to stop aspirin.Start eliquis 2.5 mg twice a day.Prescription sent to pharmacy.Appointment scheduled with Joni Reining DNP 07/24/18 at 10:00 am.

## 2018-06-26 ENCOUNTER — Ambulatory Visit (INDEPENDENT_AMBULATORY_CARE_PROVIDER_SITE_OTHER): Payer: Medicare HMO | Admitting: Nurse Practitioner

## 2018-06-26 DIAGNOSIS — Z79899 Other long term (current) drug therapy: Secondary | ICD-10-CM | POA: Diagnosis not present

## 2018-06-26 DIAGNOSIS — I480 Paroxysmal atrial fibrillation: Secondary | ICD-10-CM

## 2018-06-26 LAB — CBC
HEMOGLOBIN: 12.9 g/dL (ref 11.1–15.9)
Hematocrit: 39.1 % (ref 34.0–46.6)
MCH: 31.2 pg (ref 26.6–33.0)
MCHC: 33 g/dL (ref 31.5–35.7)
MCV: 94 fL (ref 79–97)
Platelets: 357 10*3/uL (ref 150–450)
RBC: 4.14 x10E6/uL (ref 3.77–5.28)
RDW: 13.6 % (ref 11.7–15.4)
WBC: 8.9 10*3/uL (ref 3.4–10.8)

## 2018-06-26 LAB — BASIC METABOLIC PANEL
BUN/Creatinine Ratio: 17 (ref 12–28)
BUN: 12 mg/dL (ref 8–27)
CO2: 24 mmol/L (ref 20–29)
Calcium: 9.2 mg/dL (ref 8.7–10.3)
Chloride: 103 mmol/L (ref 96–106)
Creatinine, Ser: 0.72 mg/dL (ref 0.57–1.00)
GFR calc Af Amer: 92 mL/min/{1.73_m2} (ref >59)
GFR calc non Af Amer: 80 mL/min/{1.73_m2} (ref >59)
Glucose: 90 mg/dL (ref 65–99)
Potassium: 4.2 mmol/L (ref 3.5–5.2)
Sodium: 140 mmol/L (ref 134–144)

## 2018-06-26 LAB — CUP PACEART INCLINIC DEVICE CHECK
Date Time Interrogation Session: 20200210094907
Implantable Pulse Generator Implant Date: 20200129

## 2018-06-26 NOTE — Patient Instructions (Signed)
Medication Instructions:  NONE If you need a refill on your cardiac medications before your next appointment, please call your pharmacy.   Lab work:TODAY CBC BMET If you have labs (blood work) drawn today and your tests are completely normal, you will receive your results only by: [name] MyChart Message (if you have MyChart) OR . A paper copy in the mail If you have any lab test that is abnormal or we need to change your treatment, we will call you to review the results.  Testing/Procedures: NONE  Follow-Up: At [facility], you and your health needs are our priority.  As part of our continuing mission to provide you with exceptional heart care, we have created designated Provider Care Teams.  These Care Teams include your primary Cardiologist (physician) and Advanced Practice Providers (APPs -  Physician Assistants and Nurse Practitioners) who all work together to provide you with the care you need, when you need it. .   Any Other Special Instructions Will Be Listed Below (If Applicable).

## 2018-06-26 NOTE — Progress Notes (Signed)
ILR wound check in clinic. AF burden 0.3%. V rates controlled. Pt tolerating Eliquis with no bleeding issues. will obtain CBC, BMET today. Follow up with Dr C as scheduled

## 2018-06-28 ENCOUNTER — Telehealth: Payer: Self-pay

## 2018-06-28 NOTE — Telephone Encounter (Signed)
Notes recorded by Sigurd Sosapp, Angelyne Terwilliger, RN on 06/28/2018 at 8:21 AM EST The patient has been notified of the result and verbalized understanding. All questions (if any) were answered. Sigurd SosMichael Neka Bise, RN 06/28/2018 8:21 AM

## 2018-06-28 NOTE — Telephone Encounter (Signed)
-----   Message from Amber K Seiler, NP sent at 06/27/2018 11:54 AM EST ----- Please notify patient of stable labs. Thanks! 

## 2018-07-18 ENCOUNTER — Ambulatory Visit (INDEPENDENT_AMBULATORY_CARE_PROVIDER_SITE_OTHER): Payer: Medicare HMO | Admitting: *Deleted

## 2018-07-18 DIAGNOSIS — G459 Transient cerebral ischemic attack, unspecified: Secondary | ICD-10-CM

## 2018-07-21 NOTE — Progress Notes (Signed)
Cardiology Office Note   Date:  07/24/2018   ID:  Evelyn [name], DOB 12-05-1940, MRN [id number]  PCP:  Evelyn Loffler, PA-C  Cardiologist:  Dr. Royann [name]  Chief Complaint  Patient presents with  . Follow-up  . Coronary Artery Disease  . Atrial Fibrillation    IRL in situ     History of Present Illness: Evelyn [name] is a 78 y.o. female who presents for ongoing assessment and management of CAD with stents to the LAD, CABG (LIMA to LAD, SVG to Diagonal, SVG to OM in 2004, HL. She had a TIA in 05/2018 with symptoms of expressive aphasia.   She apparently had symptoms of increased angina one week prior to TIA. It was postulated that these symptoms may have been related to atrial fib.. As a result of this, it was recommended by Dr. Royann [name] that she have a IRL placed to evaluate for PAF.  Other history includes ascending aortic aneurysm, very mild and stable in size on follow up studies., HTN, HL, and aortic insufficiency. Loop recorder was inserted on 06/14/2018. Interrogation of ILR on 06/19/2018 revealed atrial fib with RVR. She was subsequently placed on Eliquis 2.5 mg, ASA was stopped, digoxin 0.125 mg was added, she was to continue metoprolol 100 mg BID.   She was seen by Evelyn Soda, NP on 06/26/2018 with ILR wound check and interrogation. She reported AF burden of 0.3% with V rates controlled. Follow up labs were ordered to include BMET and CBC as she continued on Eliquis. Labs revealed normal kidney function with Creatinine 0.72, she was not found to be anemic or thrombocytopenic.   She comes today without any cardiac complaints. She has some dyspnea when she bends over. She denies chest pain, dizziness or fatigue. She is medically compliant.   Past Medical History:  Diagnosis Date  . Aneurysm (HCC)   . Aortic insufficiency   . CAD (coronary artery disease)    CABG LIMA to LAD,vein graft to diagonal,vein graft to OM  . Dyslipidemia   . GERD (gastroesophageal reflux disease)   .  Hypothyroid     Past Surgical History:  Procedure Laterality Date  . ABDOMINAL HYSTERECTOMY    . BACK SURGERY  2009  . CORONARY ANGIOPLASTY  2003   cutting balloon atherectomy in-stent stenosis of the LAD.  [name] CORONARY ANGIOPLASTY WITH STENT PLACEMENT  2002   remote PCI & stenting LAD,  . CORONARY ARTERY BYPASS GRAFT  2004   LIMA to LAD,vein graft to diagonal,vein graft to OM  . FOOT SURGERY    . LEFT HEART CATH AND CORS/GRAFTS ANGIOGRAPHY N/A 02/23/2017   Procedure: LEFT HEART CATH AND CORS/GRAFTS ANGIOGRAPHY;  Surgeon: Evelyn Ouch, MD;  Location: [facility] INVASIVE CV LAB;  Service: Cardiovascular;  Laterality: N/A;  . LEFT HEART CATHETERIZATION WITH CORONARY ANGIOGRAM N/A 12/15/2011   Procedure: LEFT HEART CATHETERIZATION WITH CORONARY ANGIOGRAM;  Surgeon: Evelyn Fair, MD;  Location: [facility] CATH LAB;  Service: Cardiovascular;  Laterality: N/A;  . LOOP RECORDER INSERTION N/A 06/14/2018   Procedure: LOOP RECORDER INSERTION;  Surgeon: Evelyn Fair, MD;  Location: [facility] INVASIVE CV LAB;  Service: Cardiovascular;  Laterality: N/A;  . NECK SURGERY    . NM MYOCAR PERF WALL MOTION  12/01/2011   normal study-persistent exercise induced ECG changes raises the concern for ischemia (balanced ischemia).  . NOSE SURGERY    . TONSILLECTOMY  1948  . TUBAL LIGATION       Current Outpatient Medications  Medication Sig Dispense Refill  .  ALPRAZolam (XANAX) 0.25 MG tablet Take 1 tablet (0.25 mg total) by mouth 3 (three) times daily as needed for anxiety. 30 tablet 5  . apixaban (ELIQUIS) 2.5 MG TABS tablet Take 1 tablet (2.5 mg total) by mouth 2 (two) times daily. 60 tablet 6  . aspirin 325 MG tablet Take 325 mg by mouth daily.    . furosemide (LASIX) 20 MG tablet Take 1 tablet (20 mg total) by mouth daily. 90 tablet 3  . HYDROcodone-acetaminophen (NORCO) 5-325 MG tablet Take 1 tablet by mouth every 6 (six) hours as needed for moderate pain. 120 tablet 0  . hydrocortisone 2.5 % cream APPLY TO AFFECTED  AREA TWICE A DAY 28.35 g 2  . isosorbide mononitrate (IMDUR) 60 MG 24 hr tablet TAKE 1 TABLET (60 MG TOTAL) DAILY BY MOUTH. 90 tablet 0  . levothyroxine (SYNTHROID, LEVOTHROID) 75 MCG tablet Take 1 tablet (75 mcg total) by mouth daily before breakfast. 30 tablet 6  . loratadine (CLARITIN) 10 MG tablet TAKE 1 TABLET BY MOUTH EVERY DAY 90 tablet 0  . metoprolol tartrate (LOPRESSOR) 100 MG tablet Take 1 tablet (100 mg total) by mouth 2 (two) times daily. 60 tablet 11  . nitroGLYCERIN (NITROSTAT) 0.4 MG SL tablet PLACE 1 TABLET (0.4 MG TOTAL) UNDER THE TONGUE EVERY 5 (FIVE) MINUTES AS NEEDED FOR CHEST PAIN. 25 tablet 0  . rosuvastatin (CRESTOR) 20 MG tablet Take 1.5 tablets (30 mg total) by mouth every evening. Please have either primary care or cardiologist provide future refills. 45 tablet 5  . Zoledronic Acid (RECLAST IV) Inject into the vein. Once yearly     No current facility-administered medications for this visit.     Allergies:   Sulfa antibiotics    Social History:  The patient  reports that she has never smoked. She has never used smokeless tobacco. She reports that she does not drink alcohol or use drugs.   Family History:  The patient's family history includes Asthma in her maternal grandmother; Cancer (age of onset: 43) in her sister; Heart attack in her mother; Heart failure in her father; Hypertension in her father; Stroke in her brother and daughter; Voice disorder in her daughter.    ROS: All other systems are reviewed and negative. Unless otherwise mentioned in H&P    PHYSICAL EXAM: VS:  BP (!) 146/82   Pulse (!) 52   Ht  (1.575 m)   Wt 115 lb (52.2 kg)   BMI 21.03 kg/m  , BMI Body mass index is 21.03 kg/m. GEN: Well nourished, well developed, in no acute distress HEENT: mild enlargement of the thyroid on distal lobes.  Neck: no JVD, carotid bruits, or masses Cardiac: RRR; no murmurs, rubs, or gallops,no edema  Respiratory:  Clear to auscultation bilaterally,  normal work of breathing GI: soft, nontender, nondistended, + BS MS: no deformity or atrophy Skin: warm and dry, no rash Neuro:  Strength and sensation are intact Psych: euthymic mood, full affect   EKG:  Not completed this office visit.   Recent Labs: 03/14/2018: TSH 0.131 06/26/2018: BUN 12; Creatinine, Ser 0.72; Hemoglobin 12.9; Platelets 357; Potassium 4.2; Sodium 140    Lipid Panel    Component Value Date/Time   CHOL 157 07/21/2016 0935   TRIG 76 07/21/2016 0935   HDL 56 07/21/2016 0935   CHOLHDL 2.8 07/21/2016 0935   CHOLHDL 2.0 07/11/2014 0913   VLDL 12 07/11/2014 0913   LDLCALC 86 07/21/2016 0935      Wt Readings  from Last 3 Encounters:  07/24/18 115 lb (52.2 kg)  06/19/18 114 lb 9.6 oz (52 kg)  06/14/18 115 lb 6.4 oz (52.3 kg)      Other studies Reviewed: AAA Duplex Ultrasound 07/22/2017.   Final Interpretation: Abdominal Aorta: No evidence of an abdominal aortic aneurysm was visualized. The largest aortic measurement is 1.8 cm  Cardiac Cath 02/23/2017 Conclusion     The left ventricular systolic function is normal.  LV end diastolic pressure is mildly elevated.  The left ventricular ejection fraction is 55-65% by visual estimate.  Ost LAD to Mid LAD lesion, 100 %stenosed.  Ost Cx lesion, 20 %stenosed.  LM lesion, 20 %stenosed.  Prox RCA lesion, 20 %stenosed.  SVG and is normal in caliber and anatomically normal.  LIMA and is normal in caliber and anatomically normal.   1. Significant underlying one-vessel coronary artery disease with chronically occluded stents in the LAD. Patent LIMA to LAD and patent SVG to first diagonal. No significant obstructive disease affecting the distal left main or ostial left circumflex. The SVG to OM is known to be occluded and was not injected.  2. Normal LV systolic function mildly elevated left ventricular end-diastolic pressure.     ASSESSMENT AND PLAN:  1. Atrial fib: She has loop recorder in situ.  Recently interrogated in February and is being remotely interrogated as well. She has <3% burden for atrial fib. Continue Eliquis. She is given a voucher card for the Eliquis as she had the anticoagulant called in to the pharmacy and not prescribed in office. Heart rate is well controlled. . .   2. Hypertension:  Elevated today. She was late to the appointment. Will not make any changes in her regimen at this time. She is on isosorbide and metoprolol.   3. CAD: Hx of CABG in 2004, and stents to the LAD. She remains on metoprolol and isosorbide with hx of angina. She states she is pain free on this regimen.   4. TIA: No residual neurologic deficits at this time.   5. Hypothyroidism. Has been diagnosed with goiters. On levothyroxine.    Current medicines are reviewed at length with the patient today.    Labs/ tests ordered today include: None  Bettey Mare. Liborio Nixon, ANP, AACC   07/24/2018 11:01 AM    [facility] [address] Office [phone] Fax [phone]

## 2018-07-23 LAB — CUP PACEART REMOTE DEVICE CHECK
Date Time Interrogation Session: 20200302184132
Implantable Pulse Generator Implant Date: 20200129

## 2018-07-24 ENCOUNTER — Encounter: Payer: Self-pay | Admitting: Adult Health

## 2018-07-24 ENCOUNTER — Other Ambulatory Visit: Payer: Self-pay

## 2018-07-24 ENCOUNTER — Ambulatory Visit: Payer: Medicare HMO | Admitting: Adult Health

## 2018-07-24 VITALS — BP 146/82 | HR 52 | Ht 62.0 in | Wt 115.0 lb

## 2018-07-24 DIAGNOSIS — G459 Transient cerebral ischemic attack, unspecified: Secondary | ICD-10-CM | POA: Diagnosis not present

## 2018-07-24 DIAGNOSIS — I2510 Atherosclerotic heart disease of native coronary artery without angina pectoris: Secondary | ICD-10-CM | POA: Diagnosis not present

## 2018-07-24 DIAGNOSIS — I10 Essential (primary) hypertension: Secondary | ICD-10-CM

## 2018-07-24 DIAGNOSIS — I480 Paroxysmal atrial fibrillation: Secondary | ICD-10-CM | POA: Diagnosis not present

## 2018-07-24 NOTE — Patient Instructions (Addendum)
Follow-Up: You will need a follow up appointment in 6 months.  Please call our office 2 months in advance, July 2020 to schedule this September 2020 appointment.  You may see Thurmon Fair, MD or one of the following Advanced Practice Providers on your designated Care Team:  Azalee Course, PA-C  Micah Flesher, [place]     Medication Instructions:  NO CHANGES- Your physician recommends that you continue on your current medications as directed. Please refer to the Current Medication list given to you today. If you need a refill on your cardiac medications before your next appointment, please call your pharmacy. Labwork: When you have labs (blood work) and your tests are completely normal, you will receive your results ONLY by MyChart Message (if you have MyChart) -OR- A paper copy in the mail.  At [facility], you and your health needs are our priority.  As part of our continuing mission to provide you with exceptional heart care, we have created designated Provider Care Teams.  These Care Teams include your primary Cardiologist (physician) and Advanced Practice Providers (APPs -  Physician Assistants and Nurse Practitioners) who all work together to provide you with the care you need, when you need it.  Thank you for choosing [facility] at [facility]!!

## 2018-07-26 NOTE — Progress Notes (Signed)
Carelink Summary Report / Loop Recorder 

## 2018-07-26 NOTE — Progress Notes (Signed)
Ty! MCr 

## 2018-07-27 NOTE — Telephone Encounter (Signed)
NA

## 2018-07-31 ENCOUNTER — Other Ambulatory Visit: Payer: Self-pay | Admitting: Internal Medicine

## 2018-08-21 ENCOUNTER — Ambulatory Visit (INDEPENDENT_AMBULATORY_CARE_PROVIDER_SITE_OTHER): Payer: Medicare HMO | Admitting: *Deleted

## 2018-08-21 ENCOUNTER — Other Ambulatory Visit: Payer: Self-pay

## 2018-08-21 DIAGNOSIS — G459 Transient cerebral ischemic attack, unspecified: Secondary | ICD-10-CM | POA: Diagnosis not present

## 2018-08-21 LAB — CUP PACEART REMOTE DEVICE CHECK
Date Time Interrogation Session: 20200404194127
Implantable Pulse Generator Implant Date: 20200129

## 2018-08-30 NOTE — Progress Notes (Signed)
Carelink Summary Report / Loop Recorder 

## 2018-09-13 DIAGNOSIS — R079 Chest pain, unspecified: Secondary | ICD-10-CM | POA: Diagnosis not present

## 2018-09-19 ENCOUNTER — Ambulatory Visit: Payer: Medicare HMO | Admitting: "Endocrinology

## 2018-09-21 ENCOUNTER — Ambulatory Visit (INDEPENDENT_AMBULATORY_CARE_PROVIDER_SITE_OTHER): Payer: Medicare HMO | Admitting: *Deleted

## 2018-09-21 ENCOUNTER — Other Ambulatory Visit: Payer: Self-pay

## 2018-09-21 DIAGNOSIS — G459 Transient cerebral ischemic attack, unspecified: Secondary | ICD-10-CM

## 2018-09-22 LAB — CUP PACEART REMOTE DEVICE CHECK
Date Time Interrogation Session: 20200507201029
Implantable Pulse Generator Implant Date: 20200129

## 2018-09-23 ENCOUNTER — Other Ambulatory Visit: Payer: Self-pay | Admitting: Cardiovascular Disease

## 2018-09-26 NOTE — Progress Notes (Signed)
Carelink Summary Report / Loop Recorder 

## 2018-09-27 ENCOUNTER — Other Ambulatory Visit: Payer: Self-pay | Admitting: Neurology

## 2018-09-27 DIAGNOSIS — G459 Transient cerebral ischemic attack, unspecified: Secondary | ICD-10-CM

## 2018-09-27 DIAGNOSIS — E782 Mixed hyperlipidemia: Secondary | ICD-10-CM

## 2018-10-03 ENCOUNTER — Other Ambulatory Visit: Payer: Self-pay

## 2018-10-03 ENCOUNTER — Other Ambulatory Visit: Payer: Self-pay | Admitting: "Endocrinology

## 2018-10-03 DIAGNOSIS — E039 Hypothyroidism, unspecified: Secondary | ICD-10-CM

## 2018-10-12 ENCOUNTER — Other Ambulatory Visit: Payer: Self-pay | Admitting: Physician Assistant

## 2018-10-12 ENCOUNTER — Other Ambulatory Visit: Payer: Self-pay

## 2018-10-12 ENCOUNTER — Telehealth: Payer: Self-pay | Admitting: Physician Assistant

## 2018-10-12 ENCOUNTER — Telehealth: Payer: Self-pay | Admitting: *Deleted

## 2018-10-12 DIAGNOSIS — E782 Mixed hyperlipidemia: Secondary | ICD-10-CM

## 2018-10-12 DIAGNOSIS — G459 Transient cerebral ischemic attack, unspecified: Secondary | ICD-10-CM

## 2018-10-12 MED ORDER — ROSUVASTATIN CALCIUM 20 MG PO TABS: 20.0000 mg | ORAL_TABLET | Freq: Every evening | ORAL | 1 refills | Status: DC

## 2018-10-12 MED ORDER — ROSUVASTATIN CALCIUM 10 MG PO TABS: 10.0000 mg | ORAL_TABLET | Freq: Every evening | ORAL | 1 refills | Status: DC

## 2018-10-12 NOTE — Telephone Encounter (Signed)
New orders sent

## 2018-10-12 NOTE — Telephone Encounter (Signed)
Fax from [facility] [place] Rosuvastatin refill request Pt would like 20 mg & 10 mg tabs in order to take 30 mg Instead of taking 1.5 tabs of 20 mg If appropriate send in new Rx Please advise

## 2018-10-13 ENCOUNTER — Encounter: Payer: Self-pay | Admitting: Physician Assistant

## 2018-10-13 ENCOUNTER — Ambulatory Visit: Payer: Medicare HMO | Admitting: Physician Assistant

## 2018-10-13 VITALS — BP 127/62 | HR 55 | Temp 96.9°F | Ht 62.0 in | Wt 115.6 lb

## 2018-10-13 DIAGNOSIS — S22080D Wedge compression fracture of T11-T12 vertebra, subsequent encounter for fracture with routine healing: Secondary | ICD-10-CM

## 2018-10-13 DIAGNOSIS — Z79891 Long term (current) use of opiate analgesic: Secondary | ICD-10-CM | POA: Diagnosis not present

## 2018-10-13 DIAGNOSIS — M5030 Other cervical disc degeneration, unspecified cervical region: Secondary | ICD-10-CM

## 2018-10-13 DIAGNOSIS — E782 Mixed hyperlipidemia: Secondary | ICD-10-CM | POA: Diagnosis not present

## 2018-10-13 DIAGNOSIS — M5412 Radiculopathy, cervical region: Secondary | ICD-10-CM

## 2018-10-13 DIAGNOSIS — I10 Essential (primary) hypertension: Secondary | ICD-10-CM

## 2018-10-13 DIAGNOSIS — E039 Hypothyroidism, unspecified: Secondary | ICD-10-CM | POA: Diagnosis not present

## 2018-10-13 LAB — CMP14+EGFR
ALT: 12 IU/L (ref 0–32)
AST: 15 IU/L (ref 0–40)
Albumin/Globulin Ratio: 1.8 (ref 1.2–2.2)
Albumin: 4.2 g/dL (ref 3.7–4.7)
Alkaline Phosphatase: 53 IU/L (ref 39–117)
BUN/Creatinine Ratio: 14 (ref 12–28)
BUN: 11 mg/dL (ref 8–27)
Bilirubin Total: 0.4 mg/dL (ref 0.0–1.2)
CO2: 24 mmol/L (ref 20–29)
Calcium: 9.5 mg/dL (ref 8.7–10.3)
Chloride: 104 mmol/L (ref 96–106)
Creatinine, Ser: 0.78 mg/dL (ref 0.57–1.00)
GFR calc Af Amer: 83 mL/min/{1.73_m2} (ref >59)
GFR calc non Af Amer: 72 mL/min/{1.73_m2} (ref >59)
Globulin, Total: 2.3 g/dL (ref 1.5–4.5)
Glucose: 94 mg/dL (ref 65–99)
Potassium: 3.8 mmol/L (ref 3.5–5.2)
Sodium: 143 mmol/L (ref 134–144)
Total Protein: 6.5 g/dL (ref 6.0–8.5)

## 2018-10-13 LAB — CBC WITH DIFFERENTIAL/PLATELET
Basophils Absolute: 0.1 10*3/uL (ref 0.0–0.2)
Basos: 1 %
EOS (ABSOLUTE): 0.1 10*3/uL (ref 0.0–0.4)
Eos: 1 %
Hematocrit: 39.3 % (ref 34.0–46.6)
Hemoglobin: 13.3 g/dL (ref 11.1–15.9)
Immature Grans (Abs): 0 10*3/uL (ref 0.0–0.1)
Immature Granulocytes: 0 %
Lymphocytes Absolute: 3.9 10*3/uL — ABNORMAL HIGH (ref 0.7–3.1)
Lymphs: 45 %
MCH: 30.7 pg (ref 26.6–33.0)
MCHC: 33.8 g/dL (ref 31.5–35.7)
MCV: 91 fL (ref 79–97)
Monocytes Absolute: 0.8 10*3/uL (ref 0.1–0.9)
Monocytes: 9 %
Neutrophils Absolute: 3.8 10*3/uL (ref 1.4–7.0)
Neutrophils: 44 %
Platelets: 241 10*3/uL (ref 150–450)
RBC: 4.33 x10E6/uL (ref 3.77–5.28)
RDW: 13.4 % (ref 11.7–15.4)
WBC: 8.7 10*3/uL (ref 3.4–10.8)

## 2018-10-13 LAB — LIPID PANEL
Chol/HDL Ratio: 2.8 ratio (ref 0.0–4.4)
Cholesterol, Total: 179 mg/dL (ref 100–199)
HDL: 65 mg/dL (ref >39)
LDL Calculated: 99 mg/dL (ref 0–99)
Triglycerides: 76 mg/dL (ref 0–149)
VLDL Cholesterol Cal: 15 mg/dL (ref 5–40)

## 2018-10-13 MED ORDER — HYDROCODONE-ACETAMINOPHEN 5-325 MG PO TABS: 1.0000 | ORAL_TABLET | Freq: Four times a day (QID) | ORAL | 0 refills | Status: DC | PRN

## 2018-10-13 NOTE — Progress Notes (Signed)
BP 127/62   Pulse (!) 55   Temp (!) 96.9 F (36.1 C) (Oral)   Ht 5' 2"  (1.575 m)   Wt 115 lb 9.6 oz (52.4 kg)   BMI 21.14 kg/m    Subjective:    Patient ID: Evelyn [name], female    DOB: 25-Oct-1940, 78 y.o.   MRN: [id number]  HPI: Evelyn [name] is a 78 y.o. female presenting on 10/13/2018 for Hypertension (3 month); Hyperlipidemia; Hypothyroidism; and Medical Management of Chronic Issues  Patient reports that overall she is doing well and just needs to come in for a periodic recheck on her chronic medical conditions.  They do include hypertension, hyperlipidemia, hypothyroidism.  She has been followed by endocrinology and cardiology.  She also has chronic pain related to her multiple compression fractures in her spine.  She does take a limited amount medication and does not have any difficulty with taking them.   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 Multiple thoracic wedge fractures  C5-7 ACDF without adverse features. anterior compression deformity at T11 with an associated Schmorl's node involving the superior end plate at this level.   At T8-9, a left paracentral focal disc protrusion.   At T5-6, there is a central broad-based annular disc bulge.  A moderate sized hemangioma at T9.  This patient returns for a 3 month recheck on narcotic use for the above named conditions  Current medications- Hydrocodone 5/325 1 every 6 hours as needed for severe pain Medication side effects- onne Any concerns-none  Pain on scale of 1-10-7 Frequency-Daily What increases pain-bending and lifting What makes pain Better-nothing mild Effects on ADL -mild Any change in general medical condition-no  Effectiveness of current meds- good Adverse reactions form pain meds-no PMP AWARE website reviewed: Yes Any suspicious activity on PMP Aware: No MME daily dose: 20   Contract on file 03/23/2018 Last UDS 10/13/2018  [facility]  script sent or current  History of overdose or risk of abuse no  Past Medical History:  Diagnosis Date  . Aneurysm ([place])   . Aortic insufficiency   . CAD (coronary artery disease)    CABG LIMA to LAD,vein graft to diagonal,vein graft to OM  . Dyslipidemia   . GERD (gastroesophageal reflux disease)   . Hypothyroid    Relevant past medical, surgical, family and social history reviewed and updated as indicated. Interim medical history since our last visit reviewed. Allergies and medications reviewed and updated. DATA REVIEWED: CHART IN EPIC  Family History reviewed for pertinent findings.  Review of Systems  Constitutional: Negative.   HENT: Negative.   Eyes: Negative.   Respiratory: Negative.   Gastrointestinal: Negative.   Genitourinary: Negative.   Musculoskeletal: Positive for arthralgias, myalgias, neck pain and neck stiffness.    Allergies as of 10/13/2018      Reactions   Sulfa Antibiotics Swelling      Medication List       Accurate as of Oct 13, 2018 11:59 PM. If you have any questions, ask your nurse or doctor.        STOP taking these medications   aspirin 325 MG tablet Stopped by:  Terald Sleeper, PA-C     TAKE these medications   ALPRAZolam 0.25 MG tablet Commonly known as:  XANAX Take 1 tablet (0.25 mg total) by mouth 3 (three) times daily as needed for anxiety.   apixaban 2.5 MG Tabs tablet Commonly known as:  Eliquis Take  1 tablet (2.5 mg total) by mouth 2 (two) times daily.   furosemide 20 MG tablet Commonly known as:  LASIX Take 1 tablet (20 mg total) by mouth daily.   HYDROcodone-acetaminophen 5-325 MG tablet Commonly known as:  Norco Take 1 tablet by mouth every 6 (six) hours as needed for moderate pain. What changed:  Another medication with the same name was added. Make sure you understand how and when to take each. Changed by:  Terald Sleeper, PA-C   HYDROcodone-acetaminophen 5-325 MG tablet Commonly known as:  Norco Take 1 tablet  by mouth every 6 (six) hours as needed for moderate pain. What changed:  You were already taking a medication with the same name, and this prescription was added. Make sure you understand how and when to take each. Changed by:  Terald Sleeper, PA-C   HYDROcodone-acetaminophen 5-325 MG tablet Commonly known as:  Norco Take 1 tablet by mouth every 6 (six) hours as needed for moderate pain. What changed:  You were already taking a medication with the same name, and this prescription was added. Make sure you understand how and when to take each. Changed by:  Terald Sleeper, PA-C   hydrocortisone 2.5 % cream APPLY TO AFFECTED AREA TWICE A DAY   isosorbide mononitrate 60 MG 24 hr tablet Commonly known as:  IMDUR TAKE 1 TABLET (60 MG TOTAL) DAILY BY MOUTH.   levothyroxine 75 MCG tablet Commonly known as:  SYNTHROID Take 1 tablet (75 mcg total) by mouth daily before breakfast.   loratadine 10 MG tablet Commonly known as:  CLARITIN TAKE 1 TABLET BY MOUTH EVERY DAY   metoprolol tartrate 100 MG tablet Commonly known as:  LOPRESSOR Take 1 tablet (100 mg total) by mouth 2 (two) times daily.   nitroGLYCERIN 0.4 MG SL tablet Commonly known as:  NITROSTAT PLACE 1 TABLET (0.4 MG TOTAL) UNDER THE TONGUE EVERY 5 (FIVE) MINUTES AS NEEDED FOR CHEST PAIN.   RECLAST IV Inject into the vein. Once yearly   rosuvastatin 10 MG tablet Commonly known as:  Crestor Take 1 tablet (10 mg total) by mouth every evening.   rosuvastatin 20 MG tablet Commonly known as:  CRESTOR Take 1 tablet (20 mg total) by mouth every evening.          Objective:    BP 127/62   Pulse (!) 55   Temp (!) 96.9 F (36.1 C) (Oral)   Ht 5' 2"  (1.575 m)   Wt 115 lb 9.6 oz (52.4 kg)   BMI 21.14 kg/m   Allergies  Allergen Reactions  . Sulfa Antibiotics Swelling    Wt Readings from Last 3 Encounters:  10/13/18 115 lb 9.6 oz (52.4 kg)  07/24/18 115 lb (52.2 kg)  06/19/18 114 lb 9.6 oz (52 kg)    Physical Exam  Constitutional:      Appearance: She is well-developed.  HENT:     Head: Normocephalic and atraumatic.  Eyes:     Conjunctiva/sclera: Conjunctivae normal.     Pupils: Pupils are equal, round, and reactive to light.  Cardiovascular:     Rate and Rhythm: Normal rate and regular rhythm.     Heart sounds: Normal heart sounds.  Pulmonary:     Effort: Pulmonary effort is normal.     Breath sounds: Normal breath sounds.  Abdominal:     General: Bowel sounds are normal.     Palpations: Abdomen is soft.  Skin:    General: Skin is warm and dry.  Findings: No rash.  Neurological:     Mental Status: She is alert and oriented to person, place, and time.     Deep Tendon Reflexes: Reflexes are normal and symmetric.  Psychiatric:        Behavior: Behavior normal.        Thought Content: Thought content normal.        Judgment: Judgment normal.     Results for orders placed or performed in visit on 10/13/18  CBC with Differential/Platelet  Result Value Ref Range   WBC 8.7 3.4 - 10.8 x10E3/uL   RBC 4.33 3.77 - 5.28 x10E6/uL   Hemoglobin 13.3 11.1 - 15.9 g/dL   Hematocrit 39.3 34.0 - 46.6 %   MCV 91 79 - 97 fL   MCH 30.7 26.6 - 33.0 pg   MCHC 33.8 31.5 - 35.7 g/dL   RDW 13.4 11.7 - 15.4 %   Platelets 241 150 - 450 x10E3/uL   Neutrophils 44 Not Estab. %   Lymphs 45 Not Estab. %   Monocytes 9 Not Estab. %   Eos 1 Not Estab. %   Basos 1 Not Estab. %   Neutrophils Absolute 3.8 1.4 - 7.0 x10E3/uL   Lymphocytes Absolute 3.9 (H) 0.7 - 3.1 x10E3/uL   Monocytes Absolute 0.8 0.1 - 0.9 x10E3/uL   EOS (ABSOLUTE) 0.1 0.0 - 0.4 x10E3/uL   Basophils Absolute 0.1 0.0 - 0.2 x10E3/uL   Immature Granulocytes 0 Not Estab. %   Immature Grans (Abs) 0.0 0.0 - 0.1 x10E3/uL  CMP14+EGFR  Result Value Ref Range   Glucose 94 65 - 99 mg/dL   BUN 11 8 - 27 mg/dL   Creatinine, Ser 0.78 0.57 - 1.00 mg/dL   GFR calc non Af Amer 72 >59 mL/min/1.73   GFR calc Af Amer 83 >59 mL/min/1.73   BUN/Creatinine  Ratio 14 12 - 28   Sodium 143 134 - 144 mmol/L   Potassium 3.8 3.5 - 5.2 mmol/L   Chloride 104 96 - 106 mmol/L   CO2 24 20 - 29 mmol/L   Calcium 9.5 8.7 - 10.3 mg/dL   Total Protein 6.5 6.0 - 8.5 g/dL   Albumin 4.2 3.7 - 4.7 g/dL   Globulin, Total 2.3 1.5 - 4.5 g/dL   Albumin/Globulin Ratio 1.8 1.2 - 2.2   Bilirubin Total 0.4 0.0 - 1.2 mg/dL   Alkaline Phosphatase 53 39 - 117 IU/L   AST 15 0 - 40 IU/L   ALT 12 0 - 32 IU/L  Lipid panel  Result Value Ref Range   Cholesterol, Total 179 100 - 199 mg/dL   Triglycerides 76 0 - 149 mg/dL   HDL 65 >39 mg/dL   VLDL Cholesterol Cal 15 5 - 40 mg/dL   LDL Calculated 99 0 - 99 mg/dL   Chol/HDL Ratio 2.8 0.0 - 4.4 ratio  Microalbumin / creatinine urine ratio  Result Value Ref Range   Creatinine, Urine 95.6 Not Estab. mg/dL   Microalbumin, Urine 15.4 Not Estab. ug/mL   Microalb/Creat Ratio 16 0 - 29 mg/g creat      Assessment & Plan:   1. Closed wedge compression fracture of eleventh thoracic vertebra with routine healing, subsequent encounter - ToxASSURE Select 13 (MW), Urine - HYDROcodone-acetaminophen (NORCO) 5-325 MG tablet; Take 1 tablet by mouth every 6 (six) hours as needed for moderate pain.  Dispense: 120 tablet; Refill: 0  2. Cervical neuropathic pain - ToxASSURE Select 13 (MW), Urine - HYDROcodone-acetaminophen (NORCO) 5-325 MG tablet; Take 1  tablet by mouth every 6 (six) hours as needed for moderate pain.  Dispense: 120 tablet; Refill: 0  3. Essential hypertension - CBC with Differential/Platelet - CMP14+EGFR - Lipid panel - Microalbumin / creatinine urine ratio  4. Mixed hyperlipidemia - CBC with Differential/Platelet - CMP14+EGFR - Lipid panel - Microalbumin / creatinine urine ratio   Continue all other maintenance medications as listed above.  Follow up plan: Return in about 3 months (around 01/13/2019).  Educational handout given for [place] PA-C [place] [address]  [place], [place] [address] [phone]   10/17/2018, 10:03 PM

## 2018-10-14 LAB — MICROALBUMIN / CREATININE URINE RATIO
Creatinine, Urine: 95.6 mg/dL
Microalb/Creat Ratio: 16 mg/g creat (ref 0–29)
Microalbumin, Urine: 15.4 ug/mL

## 2018-10-14 LAB — T4, FREE: Free T4: 0.81 ng/dL — ABNORMAL LOW (ref 0.82–1.77)

## 2018-10-14 LAB — TSH: TSH: 7.29 u[IU]/mL — ABNORMAL HIGH (ref 0.450–4.500)

## 2018-10-17 ENCOUNTER — Encounter: Payer: Self-pay | Admitting: Physician Assistant

## 2018-10-17 DIAGNOSIS — E782 Mixed hyperlipidemia: Secondary | ICD-10-CM | POA: Insufficient documentation

## 2018-10-17 DIAGNOSIS — M5030 Other cervical disc degeneration, unspecified cervical region: Secondary | ICD-10-CM | POA: Insufficient documentation

## 2018-10-18 ENCOUNTER — Ambulatory Visit (HOSPITAL_COMMUNITY)
Admission: RE | Admit: 2018-10-18 | Discharge: 2018-10-18 | Disposition: A | Discharge status: Home / Self Care | Payer: Medicare HMO | Source: Ambulatory Visit | Attending: "Endocrinology | Admitting: "Endocrinology

## 2018-10-18 ENCOUNTER — Other Ambulatory Visit: Payer: Self-pay

## 2018-10-18 DIAGNOSIS — E039 Hypothyroidism, unspecified: Secondary | ICD-10-CM | POA: Insufficient documentation

## 2018-10-18 DIAGNOSIS — E042 Nontoxic multinodular goiter: Secondary | ICD-10-CM | POA: Diagnosis not present

## 2018-10-19 LAB — TOXASSURE SELECT 13 (MW), URINE

## 2018-10-23 ENCOUNTER — Encounter: Payer: Self-pay | Admitting: "Endocrinology

## 2018-10-23 ENCOUNTER — Ambulatory Visit (INDEPENDENT_AMBULATORY_CARE_PROVIDER_SITE_OTHER): Payer: Medicare HMO | Admitting: "Endocrinology

## 2018-10-23 ENCOUNTER — Other Ambulatory Visit: Payer: Self-pay

## 2018-10-23 VITALS — BP 132/71 | HR 71 | Ht 62.0 in | Wt 115.0 lb

## 2018-10-23 DIAGNOSIS — E039 Hypothyroidism, unspecified: Secondary | ICD-10-CM | POA: Diagnosis not present

## 2018-10-23 DIAGNOSIS — E042 Nontoxic multinodular goiter: Secondary | ICD-10-CM | POA: Diagnosis not present

## 2018-10-23 MED ORDER — LEVOTHYROXINE SODIUM 88 MCG PO TABS: 88.0000 ug | ORAL_TABLET | Freq: Every day | ORAL | 6 refills | Status: DC

## 2018-10-23 NOTE — Progress Notes (Signed)
Endocrinology follow-up note  Subjective:    Patient ID: Evelyn [name], female    DOB: 06/24/1940, PCP Terald Sleeper, PA-C   Past Medical History:  Diagnosis Date  . Aneurysm ([place])   . Aortic insufficiency   . CAD (coronary artery disease)    CABG LIMA to LAD,vein graft to diagonal,vein graft to OM  . Dyslipidemia   . GERD (gastroesophageal reflux disease)   . Hypothyroid    Past Surgical History:  Procedure Laterality Date  . ABDOMINAL HYSTERECTOMY    . BACK SURGERY  2009  . CORONARY ANGIOPLASTY  2003   cutting balloon atherectomy in-stent stenosis of the LAD.  [name] CORONARY ANGIOPLASTY WITH STENT PLACEMENT  2002   remote PCI & stenting LAD,  . CORONARY ARTERY BYPASS GRAFT  2004   LIMA to LAD,vein graft to diagonal,vein graft to OM  . FOOT SURGERY    . LEFT HEART CATH AND CORS/GRAFTS ANGIOGRAPHY N/A 02/23/2017   Procedure: LEFT HEART CATH AND CORS/GRAFTS ANGIOGRAPHY;  Surgeon: Wellington Hampshire, MD;  Location: [facility];  Service: Cardiovascular;  Laterality: N/A;  . LEFT HEART CATHETERIZATION WITH CORONARY ANGIOGRAM N/A 12/15/2011   Procedure: LEFT HEART CATHETERIZATION WITH CORONARY ANGIOGRAM;  Surgeon: Sanda Klein, MD;  Location: [facility];  Service: Cardiovascular;  Laterality: N/A;  . LOOP RECORDER INSERTION N/A 06/14/2018   Procedure: LOOP RECORDER INSERTION;  Surgeon: Sanda Klein, MD;  Location: [facility];  Service: Cardiovascular;  Laterality: N/A;  . NECK SURGERY    . NM MYOCAR PERF WALL MOTION  12/01/2011   normal study-persistent exercise induced ECG changes raises the concern for ischemia (balanced ischemia).  . NOSE SURGERY    . TONSILLECTOMY  1948  . TUBAL LIGATION     Social History   Socioeconomic History  . Marital status: Married    Spouse name: Not on file  . Number of children: 2  . Years of education: Associates  . Highest education level: Associate degree: academic program  Occupational History  . Occupation: Retired   Scientific laboratory technician  . Financial resource strain: Not hard at all  . Food insecurity:    Worry: Never true    Inability: Never true  . Transportation needs:    Medical: No    Non-medical: No  Tobacco Use  . Smoking status: Never Smoker  . Smokeless tobacco: Never Used  Substance and Sexual Activity  . Alcohol use: No  . Drug use: No  . Sexual activity: Not Currently  Lifestyle  . Physical activity:    Days per week: 7 days    Minutes per session: 30 min  . Stress: Not at all  Relationships  . Social connections:    Talks on phone: More than three times a week    Gets together: More than three times a week    Attends religious service: More than 4 times per year    Active member of club or organization: Yes    Attends meetings of clubs or organizations: More than 4 times per year    Relationship status: Widowed  Other Topics Concern  . Not on file  Social History Narrative   Mother of 2 with 4 grandchildren   Right-handed   Caffeine: 8-10 cups coffee   Outpatient Encounter Medications as of 10/23/2018  Medication Sig  . ALPRAZolam (XANAX) 0.25 MG tablet Take 1 tablet (0.25 mg total) by mouth 3 (three) times daily as needed for anxiety.  [name] apixaban (ELIQUIS)  2.5 MG TABS tablet Take 1 tablet (2.5 mg total) by mouth 2 (two) times daily.  . furosemide (LASIX) 20 MG tablet Take 1 tablet (20 mg total) by mouth daily.  [name] HYDROcodone-acetaminophen (NORCO) 5-325 MG tablet Take 1 tablet by mouth every 6 (six) hours as needed for moderate pain.  . hydrocortisone 2.5 % cream APPLY TO AFFECTED AREA TWICE A DAY  . isosorbide mononitrate (IMDUR) 60 MG 24 hr tablet TAKE 1 TABLET (60 MG TOTAL) DAILY BY MOUTH.  [name] levothyroxine (SYNTHROID) 88 MCG tablet Take 1 tablet (88 mcg total) by mouth daily before breakfast.  . loratadine (CLARITIN) 10 MG tablet TAKE 1 TABLET BY MOUTH EVERY DAY  . metoprolol tartrate (LOPRESSOR) 100 MG tablet Take 1 tablet (100 mg total) by mouth 2 (two) times daily.  .  nitroGLYCERIN (NITROSTAT) 0.4 MG SL tablet PLACE 1 TABLET (0.4 MG TOTAL) UNDER THE TONGUE EVERY 5 (FIVE) MINUTES AS NEEDED FOR CHEST PAIN.  . rosuvastatin (CRESTOR) 10 MG tablet Take 1 tablet (10 mg total) by mouth every evening.  . rosuvastatin (CRESTOR) 20 MG tablet Take 1 tablet (20 mg total) by mouth every evening.  . Zoledronic Acid (RECLAST IV) Inject into the vein. Once yearly  . [DISCONTINUED] HYDROcodone-acetaminophen (NORCO) 5-325 MG tablet Take 1 tablet by mouth every 6 (six) hours as needed for moderate pain.  . [DISCONTINUED] HYDROcodone-acetaminophen (NORCO) 5-325 MG tablet Take 1 tablet by mouth every 6 (six) hours as needed for moderate pain.  . [DISCONTINUED] levothyroxine (SYNTHROID, LEVOTHROID) 75 MCG tablet Take 1 tablet (75 mcg total) by mouth daily before breakfast.   No facility-administered encounter medications on file as of 10/23/2018.    ALLERGIES: Allergies  Allergen Reactions  . Sulfa Antibiotics Swelling   VACCINATION STATUS: Immunization History  Administered Date(s) Administered  . Influenza, High Dose Seasonal PF 03/09/2016, 03/02/2017  . Pneumococcal Conjugate-13 01/11/2018    HPI  78 year old female with medical history as above. She is being seen in follow-up for hypothyroidism and multinodular goiter. She is on levothyroxine 75 mcg p.o. every morning.   - She is status post FNA 3 nodules in her thyroid benign findings. -Her previsit thyroid ultrasound is summarized below. -She was recently diagnosed with acute coronary syndrome on medical management. -She is compliant to her medications.  She has no new complaints today.  -Her previsit labs show evidence of under replacement.  - She denies family history of thyroid dysfunction. She feels occasional choking sensation in her neck, no recent voice change nor shortness of breath. She denies any exposure to neck radiation. - She denies any history of smoking.  Review of Systems Constitutional: She has  a steady body weight , no fatigue, no subjective hyper or hypothermia.    Eyes: no blurry vision, no xerophthalmia ENT: no sore throat, + nodules palpated in throat,  + intermittent dysphagia/odynophagia, no hoarseness Cardiovascular: no chest pain, no shortness of breath,  No palpitations.   Musculoskeletal: no muscle/joint aches Skin: no rashes Neurological: denies Tremors, tingling. Psychiatric: no depression/anxiety  Objective:    BP 132/71   Pulse 71   Ht _0  (1.575 m)   Wt 115 lb (52.2 kg)   SpO2 98%   BMI 21.03 kg/m   Wt Readings from Last 3 Encounters:  10/23/18 115 lb (52.2 kg)  10/13/18 115 lb 9.6 oz (52.4 kg)  07/24/18 115 lb (52.2 kg)    Physical Exam  Constitutional:  not in acute distress, normal state of mind Eyes:  EOMI,  no exophthalmos Neck: Supple Respiratory: Adequate breathing efforts Musculoskeletal: no gross deformities, strength intact in all four extremities Skin:  no rashes, no hyperemia Neurological: no tremor with outstretched hands.   CMP     Component Value Date/Time   NA 143 10/13/2018 0907   K 3.8 10/13/2018 0907   CL 104 10/13/2018 0907   CO2 24 10/13/2018 0907   GLUCOSE 94 10/13/2018 0907   GLUCOSE 99 09/01/2016 0550   BUN 11 10/13/2018 0907   CREATININE 0.78 10/13/2018 0907   CREATININE 0.59 07/11/2014 0913   CALCIUM 9.5 10/13/2018 0907   PROT 6.5 10/13/2018 0907   ALBUMIN 4.2 10/13/2018 0907   AST 15 10/13/2018 0907   ALT 12 10/13/2018 0907   ALKPHOS 53 10/13/2018 0907   BILITOT 0.4 10/13/2018 0907   GFRNONAA 72 10/13/2018 0907   GFRAA 83 10/13/2018 0907   Recent Results (from the past 2160 hour(s))  CUP PACEART REMOTE DEVICE CHECK     Status: None   Collection Time: 08/19/18  7:41 PM  Result Value Ref Range   Date Time Interrogation Session [id number]    Pulse Generator Manufacturer MERM    Pulse Gen Model BPZ02 Reveal LINQ    Pulse Gen Serial Number [id number]    Clinic Name [facility]    Implantable  Pulse Generator Type ICM/ILR    Implantable Pulse Generator Implant Date [id number]   CUP PACEART REMOTE DEVICE CHECK     Status: None   Collection Time: 09/21/18  8:10 PM  Result Value Ref Range   Date Time Interrogation Session [id number]    Pulse Generator Manufacturer MERM    Pulse Gen Model RXV40 Reveal LINQ    Pulse Gen Serial Number [id number]    Clinic Name [facility]    Implantable Pulse Generator Type ICM/ILR    Implantable Pulse Generator Implant Date [id number]   T4, Free     Status: Abnormal   Collection Time: 10/13/18  9:07 AM  Result Value Ref Range   Free T4 0.81 (L) 0.82 - 1.77 ng/dL  TSH     Status: Abnormal   Collection Time: 10/13/18  9:07 AM  Result Value Ref Range   TSH 7.290 (H) 0.450 - 4.500 uIU/mL  CBC with Differential/Platelet     Status: Abnormal   Collection Time: 10/13/18  9:07 AM  Result Value Ref Range   WBC 8.7 3.4 - 10.8 x10E3/uL   RBC 4.33 3.77 - 5.28 x10E6/uL   Hemoglobin 13.3 11.1 - 15.9 g/dL   Hematocrit 39.3 34.0 - 46.6 %   MCV 91 79 - 97 fL   MCH 30.7 26.6 - 33.0 pg   MCHC 33.8 31.5 - 35.7 g/dL   RDW 13.4 11.7 - 15.4 %   Platelets 241 150 - 450 x10E3/uL   Neutrophils 44 Not Estab. %   Lymphs 45 Not Estab. %   Monocytes 9 Not Estab. %   Eos 1 Not Estab. %   Basos 1 Not Estab. %   Neutrophils Absolute 3.8 1.4 - 7.0 x10E3/uL   Lymphocytes Absolute 3.9 (H) 0.7 - 3.1 x10E3/uL   Monocytes Absolute 0.8 0.1 - 0.9 x10E3/uL   EOS (ABSOLUTE) 0.1 0.0 - 0.4 x10E3/uL   Basophils Absolute 0.1 0.0 - 0.2 x10E3/uL   Immature Granulocytes 0 Not Estab. %   Immature Grans (Abs) 0.0 0.0 - 0.1 x10E3/uL  CMP14+EGFR     Status: None   Collection Time: 10/13/18  9:07 AM  Result Value Ref Range   Glucose  94 65 - 99 mg/dL   BUN 11 8 - 27 mg/dL   Creatinine, Ser 0.78 0.57 - 1.00 mg/dL   GFR calc non Af Amer 72 >59 mL/min/1.73   GFR calc Af Amer 83 >59 mL/min/1.73   BUN/Creatinine Ratio 14 12 - 28   Sodium 143 134 - 144 mmol/L   Potassium 3.8  3.5 - 5.2 mmol/L   Chloride 104 96 - 106 mmol/L   CO2 24 20 - 29 mmol/L   Calcium 9.5 8.7 - 10.3 mg/dL   Total Protein 6.5 6.0 - 8.5 g/dL   Albumin 4.2 3.7 - 4.7 g/dL   Globulin, Total 2.3 1.5 - 4.5 g/dL   Albumin/Globulin Ratio 1.8 1.2 - 2.2   Bilirubin Total 0.4 0.0 - 1.2 mg/dL   Alkaline Phosphatase 53 39 - 117 IU/L   AST 15 0 - 40 IU/L   ALT 12 0 - 32 IU/L  Lipid panel     Status: None   Collection Time: 10/13/18  9:07 AM  Result Value Ref Range   Cholesterol, Total 179 100 - 199 mg/dL   Triglycerides 76 0 - 149 mg/dL   HDL 65 >39 mg/dL   VLDL Cholesterol Cal 15 5 - 40 mg/dL   LDL Calculated 99 0 - 99 mg/dL   Chol/HDL Ratio 2.8 0.0 - 4.4 ratio    Comment:                                   T. Chol/HDL Ratio                                             Men  Women                               1/2 Avg.Risk  3.4    3.3                                   Avg.Risk  5.0    4.4                                2X Avg.Risk  9.6    7.1                                3X Avg.Risk 23.4   11.0   ToxASSURE Select 13 (MW), Urine     Status: None   Collection Time: 10/13/18  9:50 AM  Result Value Ref Range   Summary FINAL     Comment: ==================================================================== TOXASSURE SELECT 13 (MW) ==================================================================== Test                             Result       Flag       Units Drug Present and Declared for Prescription Verification   Hydrocodone                    62           EXPECTED  ng/mg creat   Hydromorphone                  77           EXPECTED   ng/mg creat   Dihydrocodeine                 72           EXPECTED   ng/mg creat   Norhydrocodone                 581          EXPECTED   ng/mg creat    Sources of hydrocodone include scheduled prescription    medications. Hydromorphone, dihydrocodeine and norhydrocodone are    expected metabolites of hydrocodone. Hydromorphone and    dihydrocodeine are also  available as scheduled prescription    medications. Drug Absent but Declared for Prescription Verification   Alprazolam                     Not Detected UNEXPECTED ng/mg creat ======================================= ============================= Test                      Result    Flag   Units      Ref Range   Creatinine              99               mg/dL      >=20 ==================================================================== Declared Medications:  The flagging and interpretation on this report are based on the  following declared medications.  Unexpected results may arise from  inaccuracies in the declared medications.  **Note: The testing scope of this panel includes these medications:  Alprazolam  Hydrocodone ==================================================================== For clinical consultation, please call [phone]. ====================================================================   Microalbumin / creatinine urine ratio     Status: None   Collection Time: 10/13/18  9:50 AM  Result Value Ref Range   Creatinine, Urine 95.6 Not Estab. mg/dL   Microalbumin, Urine 15.4 Not Estab. ug/mL   Microalb/Creat Ratio 16 0 - 29 mg/g creat    Comment:                        Normal:                0 -  29                        Moderately increased: 30 - 300                        Severely increased:       >300               **Please note reference interval change**    Fine-needle aspiration samples  of 3 nodules in her thyroid is benign   Assessment & Plan:   1.  hypothyroidism 2. Multinodular goiter  -Her previsit thyroid function tests are still consistent with under replacement.  I discussed and increased her levothyroxine to 88 mcg p.o. every morning.    - We discussed about the correct intake of her thyroid hormone, on empty stomach at fasting, with water, separated by at least 30 minutes from breakfast and other medications,  and separated by more than 4  hours from calcium, iron, multivitamins, acid reflux medications (PPIs). -Patient is made aware  of the fact that thyroid hormone replacement is needed for life, dose to be adjusted by periodic monitoring of thyroid function tests.   - Biopsy off  3 nodules is negative for malignancy. She will not need intervention at this time.   -Her previsit thyroid ultrasound shows unremarkable findings, with some mild enlargement in some of the nodules and decrease in others.  She will be considered for repeat thyroid ultrasound before her next visit.    - I advised patient to maintain close follow up with Terald Sleeper, PA-C for primary care needs.  Time for visit 15 minutes.  Evelyn [name] participated in the discussions, expressed understanding, and voiced agreement with the above plans.  All questions were answered to her satisfaction. she is encouraged to contact clinic should she have any questions or concerns prior to her return visit.   Follow up plan: Return in about 6 months (around 04/24/2019) for Follow up with Pre-visit Labs, Thyroid / Neck Ultrasound.  Glade Lloyd, MD Phone: [phone]  Fax: [phone]  -  This note was partially dictated with voice recognition software. Similar sounding words can be transcribed inadequately or may not  be corrected upon review.  10/23/2018, 3:52 PM

## 2018-10-24 ENCOUNTER — Ambulatory Visit (INDEPENDENT_AMBULATORY_CARE_PROVIDER_SITE_OTHER): Payer: Medicare HMO | Admitting: *Deleted

## 2018-10-24 DIAGNOSIS — G459 Transient cerebral ischemic attack, unspecified: Secondary | ICD-10-CM

## 2018-10-24 DIAGNOSIS — I480 Paroxysmal atrial fibrillation: Secondary | ICD-10-CM

## 2018-10-25 LAB — CUP PACEART REMOTE DEVICE CHECK
Date Time Interrogation Session: 20200609203958
Implantable Pulse Generator Implant Date: 20200129

## 2018-10-30 IMAGING — CT CT HEART MORP W/ CTA COR W/ SCORE W/ CA W/CM &/OR W/O CM
4 of 7 series · 9 of 20 positions shown, 10 images · IV contrast (APPLIED)
Comparison: Chest CT 02/27/2016.

CLINICAL DATA: Chest pain

EXAM:
Cardiac CTA
MEDICATIONS:
Sub lingual nitro. 4mg and lopressor 0mg
TECHNIQUE: The patient was scanned on a Siemens Force [REDACTED]ice scanner. Gantry
rotation speed was 250 msecs. Collimation was .6 mm . A 100 kV
prospective scan was triggered in the ascending thoracic aorta at
140 HU's full mA used between 35-75% of the R-R interval. Average HR
during the scan was 52 bpm. The 3D data set was interpreted on a
dedicated work station using MPR, MIP and VRT modes. A total of 80cc
of contrast was used.

[Series 7: best diast 70 % · axial · 0.35mm/px · z∈[+931,+1048]mm · 3 of 586 slices shown, 4 images]
[im 147/586  vessel]
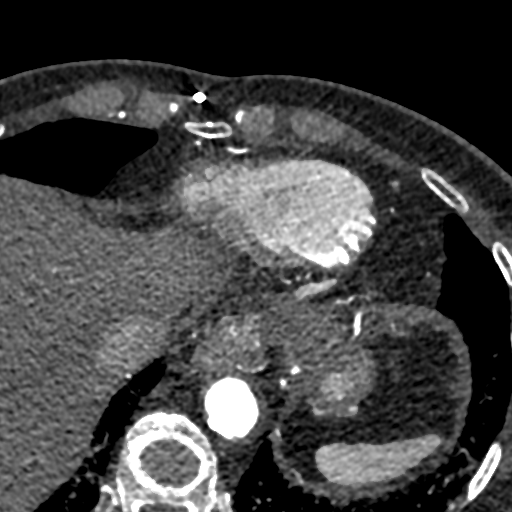
[im 147/586  lung]
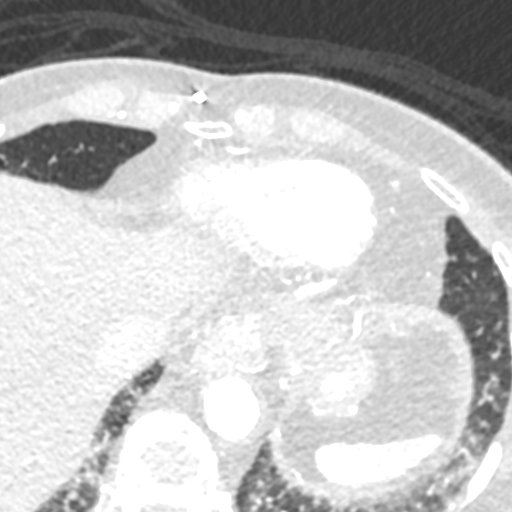
[im 293/586  vessel]
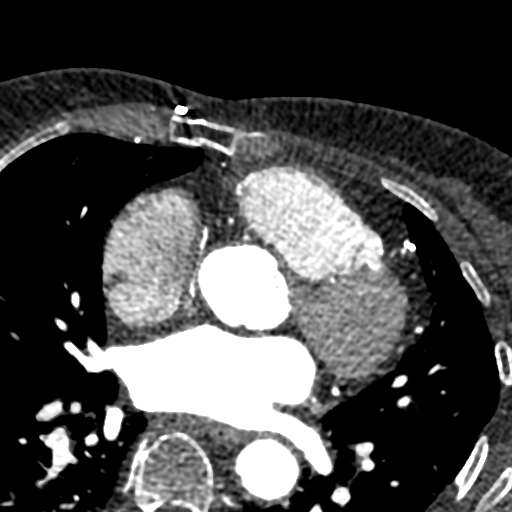
[im 439/586  vessel]
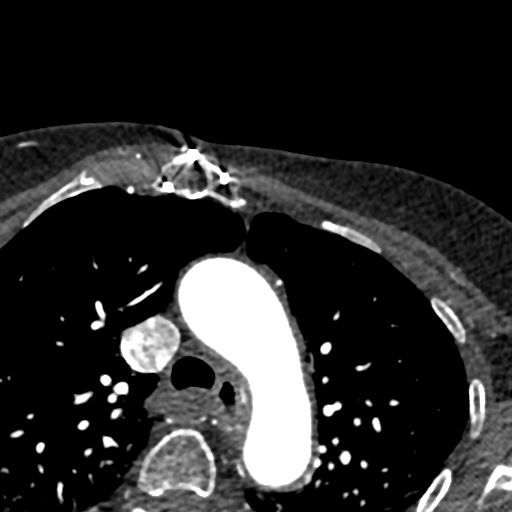

[Series 8: best syst 38 % · axial · 0.35mm/px · z∈[+963,+1035]mm · 2 of 540 slices shown]
[im 180/540  vessel]
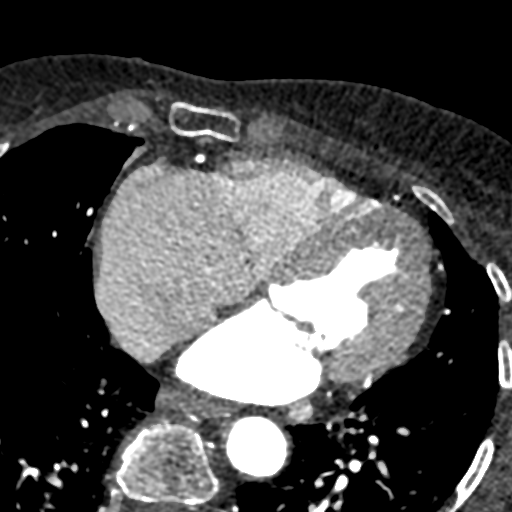
[im 360/540  vessel]
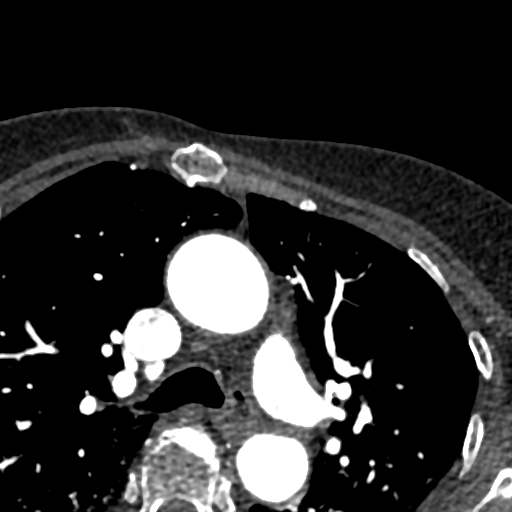

[Series 9: ts diast sharp 70 % · axial · 0.35mm/px · z∈[+963,+1035]mm · 2 of 540 slices shown]
[im 180/540  lung]
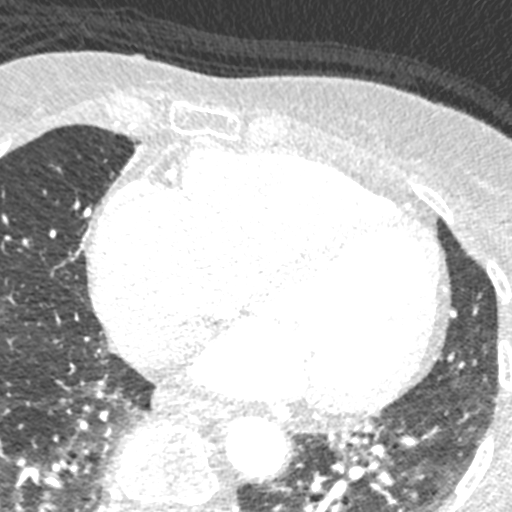
[im 360/540  lung]
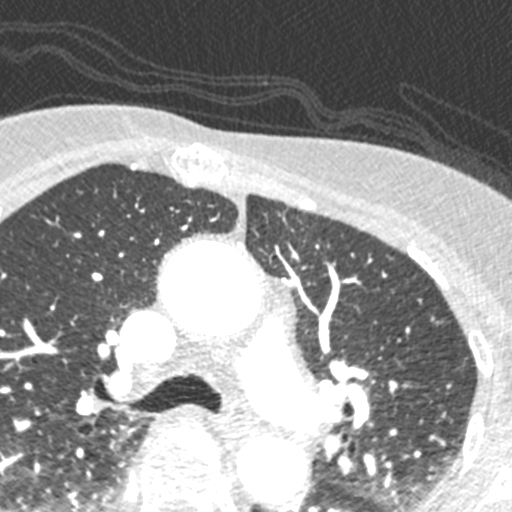

[Series 10: ts syst sharp 38 % · axial · 0.35mm/px · z∈[+969,+1038]mm · 2 of 517 slices shown]
[im 173/517  lung]
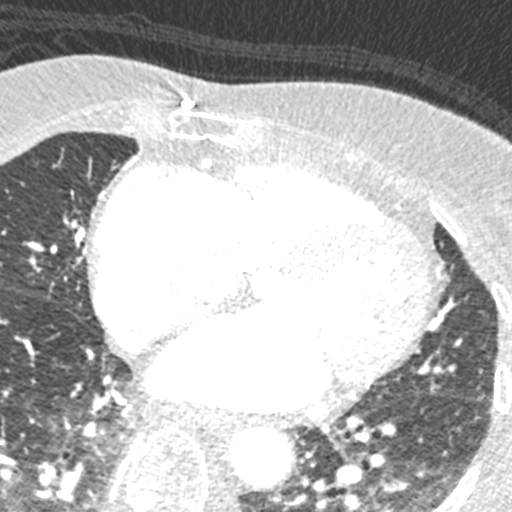
[im 345/517  lung]
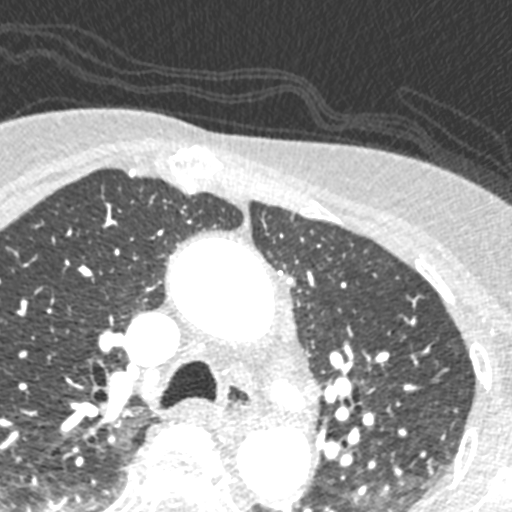

[9 of 20 positions shown; findings below may reference images not displayed]

FINDINGS: Non-cardiac: See separate report from [REDACTED]. No
significant findings on limited lung and soft tissue windows.

Calcium Score:  Not done due to previous stenting of the LAD

Coronary Arteries: Right dominant with no anomalies

LM:  Calcified 70% stenosis impinging on ostium of the circumflex

LAD: Long area of stenting that is totally occluded Mid and distal
LAD fills from patent [REDACTED]

D1:  Fills via patent NISHANT to D1

Circumflex: Less than 30% calcified proximal stenosis ostium
impinged on by distal LM disease and occluded ostial LAD/stent

OM1:  Fills antegrade from [place] to OM is occluded

RCA: Dominant and less than 30% calcified disease in proximal vessel

PDA:  Normal

PLA:  Normal

Aorta is not aneurysmal Bovine arch mild mixed atherosclerotic
Debris

Aortic Root 3.3 cm

Sinus:  Non- 2.8 cm left 2.9 cm right 2.7 cm

STJ:  2.4 cm

Arch:  2.7 cm

Descending Thoracic 2.2 cm
IMPRESSION: 1) Stable aortic root dimension 3.3 cm

2) Bovine Arch with mild atherosclerotic disease

3) Occluded proximal and mid LAD stents

4) Patent NISHANT to D1 and patent [REDACTED] to mid and distal LAD

5) Occluded NISHANT to OM. Potential ischemic territory in OM1 due to
distal LM disease greater than 70%

Menor Wisdom

EXAM:
OVER-READ INTERPRETATION  CT CHEST

The following report is an over-read performed by radiologist Dr.
over-read does not include interpretation of cardiac or coronary
anatomy or pathology. The coronary calcium score/coronary CTA
interpretation by the cardiologist is attached.
FINDINGS: Aortic atherosclerosis. Within the visualized portions of the thorax
there are no suspicious appearing pulmonary nodules or masses, there
is no acute consolidative airspace disease, no pleural effusions, no
pneumothorax and no lymphadenopathy. Visualized portions of the
upper abdomen are unremarkable. There are no aggressive appearing
lytic or blastic lesions noted in the visualized portions of the
skeleton. Median sternotomy wires. Chronic compression fracture of
superior endplate of T10 with 20% loss of anterior vertebral body
height is unchanged.
IMPRESSION: 1.  Aortic Atherosclerosis ([id number].H).
2. Additional incidental findings, as above.

## 2018-11-02 NOTE — Progress Notes (Signed)
Carelink Summary Report / Loop Recorder 

## 2018-11-09 ENCOUNTER — Other Ambulatory Visit: Payer: Self-pay | Admitting: "Endocrinology

## 2018-11-15 ENCOUNTER — Telehealth: Payer: Self-pay | Admitting: Physician Assistant

## 2018-11-15 NOTE — Telephone Encounter (Signed)
Diagnosis code given.

## 2018-11-20 ENCOUNTER — Telehealth: Payer: Self-pay | Admitting: Student

## 2018-11-20 NOTE — Telephone Encounter (Signed)
Pt is having symptoms of chest pressure with Afib RVR (Known CAD). She thinks she only has the symptoms when her heart rate is elevated. Walked patient through sending a manual transmission to review additional episodes. She states she had planned to call and ask for an appointment if she was having more episodes, and asked if we could schedule that today.  Will forward her information.    Legrand Como [address]" [place], PA-C 11/20/2018 9:16 AM   Episode from 11/16/2018, duration 56 seconds. Overall burden At/AF 0.1%

## 2018-11-20 NOTE — Telephone Encounter (Signed)
Thank you :)

## 2018-11-20 NOTE — Telephone Encounter (Signed)
Algie Coffer for scheduling Kathryne Sharper is RN [facility] call Mrs. Streets Thanks

## 2018-11-21 NOTE — Telephone Encounter (Signed)
Call placed to the patient. She stated that she feels good, just tired. She denied any chest pressure today. An appointment has been made with Dr. Sallyanne Kuster for Friday 7/10.      COVID-19 Pre-Screening Questions:  . In the past 7 to 10 days have you had a cough,  shortness of breath, headache, congestion, fever (100 or greater) body aches, chills, sore throat, or sudden loss of taste or sense of smell? No . Have you been around anyone with known Covid 19. No . Have you been around anyone who is awaiting Covid 19 test results in the past 7 to 10 days? No . Have you been around anyone who has been exposed to Covid 19, or has mentioned symptoms of Covid 19 within the past 7 to 10 days? No  If you have any concerns/questions about symptoms patients report during screening (either on the phone or at threshold). Contact the provider seeing the patient or DOD for further guidance.  If neither are available contact a member of the leadership team.

## 2018-11-23 ENCOUNTER — Telehealth: Payer: Self-pay | Admitting: Cardiovascular Disease

## 2018-11-23 NOTE — Telephone Encounter (Signed)
I called pt to confirm her appt for 11-24-18 with Dr C.

## 2018-11-24 ENCOUNTER — Encounter: Payer: Self-pay | Admitting: Cardiovascular Disease

## 2018-11-24 ENCOUNTER — Ambulatory Visit (INDEPENDENT_AMBULATORY_CARE_PROVIDER_SITE_OTHER): Payer: Medicare HMO | Admitting: Cardiovascular Disease

## 2018-11-24 ENCOUNTER — Other Ambulatory Visit: Payer: Self-pay

## 2018-11-24 VITALS — BP 153/75 | HR 60 | Ht 62.0 in | Wt 114.8 lb

## 2018-11-24 DIAGNOSIS — I712 Thoracic aortic aneurysm, without rupture: Secondary | ICD-10-CM | POA: Diagnosis not present

## 2018-11-24 DIAGNOSIS — I25718 Atherosclerosis of autologous vein coronary artery bypass graft(s) with other forms of angina pectoris: Secondary | ICD-10-CM | POA: Diagnosis not present

## 2018-11-24 DIAGNOSIS — I10 Essential (primary) hypertension: Secondary | ICD-10-CM

## 2018-11-24 DIAGNOSIS — E7800 Pure hypercholesterolemia, unspecified: Secondary | ICD-10-CM

## 2018-11-24 DIAGNOSIS — I480 Paroxysmal atrial fibrillation: Secondary | ICD-10-CM | POA: Diagnosis not present

## 2018-11-24 DIAGNOSIS — Z7901 Long term (current) use of anticoagulants: Secondary | ICD-10-CM | POA: Diagnosis not present

## 2018-11-24 DIAGNOSIS — G459 Transient cerebral ischemic attack, unspecified: Secondary | ICD-10-CM

## 2018-11-24 DIAGNOSIS — I351 Nonrheumatic aortic (valve) insufficiency: Secondary | ICD-10-CM | POA: Diagnosis not present

## 2018-11-24 DIAGNOSIS — I7121 Aneurysm of the ascending aorta, without rupture: Secondary | ICD-10-CM

## 2018-11-24 NOTE — Progress Notes (Signed)
Cardiology Office Note    Date:  11/25/2018   ID:  Evelyn LacyShirley H Curl, DOB 1940-10-06, MRN [id number]  PCP:  Remus LofflerJones, Angel S, PA-C  Cardiologist:   Thurmon FairMihai Charmin Aguiniga, MD   Chief Complaint  Patient presents with  . Shortness of Breath  . Coronary Artery Disease  . Atrial Fibrillation    Loop recorder interrogation    History of Present Illness:  Evelyn [name] is a 78 y.o. female with long-standing history of coronary artery disease with previous stents to LAD followed by bypass surgery (LIMA to LAD, SVG to diagonal, SVG to OM in 2004), hyperlipidemia, recent TIA which led to implantation of a loop recorder that promptly demonstrated the presence of asymptomatic paroxysmal atrial fibrillation with rapid ventricular response, mild ascending aortic aneurysm, aortic insufficiency, hypertension, hyperlipidemia.    For the most part she feels well and still takes care of her own household independently.  She is physically and socially active.  She does have episodes of dyspnea when she climbs steps or when she bends over but has not had edema, orthopnea or PND.  She also has unexplained episodes of weakness and dyspnea that appear to occur randomly, possibly related to arrhythmia.    Her loop recorder detected a short but a very fast episode of atrial tachycardia with 1: 1 conduction that is almost certainly atrial flutter (cycle length around 200 ms).  Aberrant conduction is apparent intermittently throughout the episode.  It lasted for about a minute, after which she went to supraventricular tachycardia with a rate about 150 bpm (am not sure if it was sinus tachycardia or ectopic atrial tachycardia.  She felt very short of breath and had presyncopal symptoms but never lost consciousness.  She did not have angina during the episode.  She had a previous episode suggesting TIA in May 2018.  She had a CT angiogram that did not show any evidence of meaningful extracranial carotid atherosclerosis or  stenoses.  She also had an echocardiogram with a bubble study in May 2018 without any evidence of intracardiac shunt.  She had normal carotid ultrasound in 2014.  Angiography in October 2018 showed virtually no change in coronary anatomy since 2013.  She has a chronically occluded LAD artery with multiple occluded stents, but has good downstream blood flow via LIMA-LAD and SVG-diagonal bypasses.  There is probably a significant area of myocardial septum that is ischemic, upstream of the blocked mid LAD.  There was no mention of a SVG bypass to the oblique marginal branch, but the left circumflex coronary artery was widely patent with no more than 20% stenosis (Chest CTA showed that the SVG-OM is now occluded).  Left ventricular regional wall motion and overall systolic function remain normal and the LVEDP was only 14 mmHg. Although the chest CT angiogram suggested there was a 70% stenosis in the left main coronary artery, that potentially could lead to ischemia in the circumflex territory, this was not found to be the case by invasive angiography.  Had an echocardiogram in May 2018, showing normal LVEF 55-60%, with inferior and inferoseptal mild hypokinesis, mildly dilated ascending aorta, mild aortic insufficiency. In September 2017 her treadmill nuclear stress test showed normal myocardial perfusion and EF 62%. In October 2017 she had the last evaluation of her aorta, showing mild aneurysmal dilation of the ascending aorta at 3.7 cm and of the aortic arch at 3.3 cm. (She is a very small frame lady, 5 foot 2 inches tall, 112 pounds).  CT angiography  of the aorta October 2018 showed unchanged aortic root dimension at 3.3 cm.  The SVG to OM was described as occluded.  She has long-standing history of coronary disease, undergoing her first cardiac catheterization in 1999. She received stents to the LAD artery in 2002 and had to have cutting balloon atherectomy for in-stent restenosis in 2003 followed by a new  LAD stent in 2004 for disease progression. Ultimately after again developing restenosis she underwent coronary bypass surgery (LIMA to LAD, SVG to diagonal, SVG to OM in 2004). She had cardiac catheterization in July 2013 and October 2018, without evidence of significant progression. This demonstrated interval total occlusion of the proximal LAD artery although the distant bypasses are all widely patent. On her stress test in 2013 and 2017  the perfusion images were "normal". It is felt that she may have occasional angina pectoris related to ischemia in the proximal septum since there is no retrograde flow from the bypasses to the proximal LAD. Her last echo in May 2018 showed normal EF 55-60%, but reported inferior and inferoseptal mild hypokinesis. 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  CT angiogram of the head and neck performed in May 2018 for TIA showed mild extracranial atherosclerosis. She has treated systemic hypertension and hyperlipidemia and hypothyroidism.  She had previous cervical spine surgery with bone grafting and discectomy in 2009.   Past Medical History:  Diagnosis Date  . Aneurysm (HCC)   . Aortic insufficiency   . CAD (coronary artery disease)    CABG LIMA to LAD,vein graft to diagonal,vein graft to OM  . Dyslipidemia   . GERD (gastroesophageal reflux disease)   . Hypothyroid     Past Surgical History:  Procedure Laterality Date  . ABDOMINAL HYSTERECTOMY    . BACK SURGERY  2009  . CORONARY ANGIOPLASTY  2003   cutting balloon atherectomy in-stent stenosis of the LAD.  [name]. CORONARY ANGIOPLASTY WITH STENT PLACEMENT  2002   remote PCI & stenting LAD,  . CORONARY ARTERY BYPASS GRAFT  2004   LIMA to LAD,vein graft to diagonal,vein graft to OM  . FOOT SURGERY    . LEFT HEART CATH AND CORS/GRAFTS ANGIOGRAPHY N/A 02/23/2017   Procedure: LEFT HEART CATH AND CORS/GRAFTS ANGIOGRAPHY;  Surgeon: Iran OuchArida, Muhammad A, MD;   Location: [facility];  Service: Cardiovascular;  Laterality: N/A;  . LEFT HEART CATHETERIZATION WITH CORONARY ANGIOGRAM N/A 12/15/2011   Procedure: LEFT HEART CATHETERIZATION WITH CORONARY ANGIOGRAM;  Surgeon: Thurmon FairMihai Marti Mclane, MD;  Location: [facility] CATH [facility];  Service: Cardiovascular;  Laterality: N/A;  . LOOP RECORDER INSERTION N/A 06/14/2018   Procedure: LOOP RECORDER INSERTION;  Surgeon: Thurmon Fairroitoru, Glennette Galster, MD;  Location: [facility];  Service: Cardiovascular;  Laterality: N/A;  . NECK SURGERY    . NM MYOCAR PERF WALL MOTION  12/01/2011   normal study-persistent exercise induced ECG changes raises the concern for ischemia (balanced ischemia).  . NOSE SURGERY    . TONSILLECTOMY  1948  . TUBAL LIGATION      Current Medications: Outpatient Medications Prior to Visit  Medication Sig Dispense Refill  . ALPRAZolam (XANAX) 0.25 MG tablet Take 1 tablet (0.25 mg total) by mouth 3 (three) times daily as needed for anxiety. 30 tablet 5  . apixaban (ELIQUIS) 2.5 MG TABS tablet Take 1 tablet (2.5 mg total) by mouth 2 (two) times daily. 60 tablet 6  . furosemide (LASIX) 20 MG tablet Take 1 tablet (20 mg total)  by mouth daily. 90 tablet 3  . HYDROcodone-acetaminophen (NORCO) 5-325 MG tablet Take 1 tablet by mouth every 6 (six) hours as needed for moderate pain. 120 tablet 0  . hydrocortisone 2.5 % cream APPLY TO AFFECTED AREA TWICE A DAY 28.35 g 2  . isosorbide mononitrate (IMDUR) 60 MG 24 hr tablet TAKE 1 TABLET (60 MG TOTAL) DAILY BY MOUTH. 90 tablet 0  . levothyroxine (SYNTHROID) 75 MCG tablet TAKE 1 TABLET (75 MCG TOTAL) BY MOUTH DAILY BEFORE BREAKFAST. 30 tablet 6  . levothyroxine (SYNTHROID) 88 MCG tablet Take 1 tablet (88 mcg total) by mouth daily before breakfast. 30 tablet 6  . loratadine (CLARITIN) 10 MG tablet TAKE 1 TABLET BY MOUTH EVERY DAY 90 tablet 0  . metoprolol tartrate (LOPRESSOR) 100 MG tablet Take 1 tablet (100 mg total) by mouth 2 (two) times daily. 60 tablet 11  .  nitroGLYCERIN (NITROSTAT) 0.4 MG SL tablet PLACE 1 TABLET (0.4 MG TOTAL) UNDER THE TONGUE EVERY 5 (FIVE) MINUTES AS NEEDED FOR CHEST PAIN. 25 tablet 0  . rosuvastatin (CRESTOR) 10 MG tablet Take 1 tablet (10 mg total) by mouth every evening. 90 tablet 1  . rosuvastatin (CRESTOR) 20 MG tablet Take 1 tablet (20 mg total) by mouth every evening. 90 tablet 1  . Zoledronic Acid (RECLAST IV) Inject into the vein. Once yearly     No facility-administered medications prior to visit.      Allergies:   Sulfa antibiotics   Social History   Socioeconomic History  . Marital status: Married    Spouse name: Not on file  . Number of children: 2  . Years of education: Associates  . Highest education level: Associate degree: academic program  Occupational History  . Occupation: Retired  Engineer, production  . Financial resource strain: Not hard at all  . Food insecurity    Worry: Never true    Inability: Never true  . Transportation needs    Medical: No    Non-medical: No  Tobacco Use  . Smoking status: Never Smoker  . Smokeless tobacco: Never Used  Substance and Sexual Activity  . Alcohol use: No  . Drug use: No  . Sexual activity: Not Currently  Lifestyle  . Physical activity    Days per week: 7 days    Minutes per session: 30 min  . Stress: Not at all  Relationships  . Social connections    Talks on phone: More than three times a week    Gets together: More than three times a week    Attends religious service: More than 4 times per year    Active member of club or organization: Yes    Attends meetings of clubs or organizations: More than 4 times per year    Relationship status: Widowed  Other Topics Concern  . Not on file  Social History Narrative   Mother of 2 with 4 grandchildren   Right-handed   Caffeine: 8-10 cups coffee     Family History:  The patient's family history includes Asthma in her maternal grandmother; Cancer (age of onset: 65) in her sister; Heart attack in her  mother; Heart failure in her father; Hypertension in her father; Stroke in her brother and daughter; Voice disorder in her daughter.   ROS:   Please see the history of present illness.    ROS All other systems reviewed and are negative.   PHYSICAL EXAM:   VS:  BP (!) 153/75   Pulse 60  Ht 5\' 2"  (1.575 m)   Wt 114 lb 12.8 oz (52.1 kg)   SpO2 96%   BMI 21.00 kg/m      General: Alert, oriented x3, no distress, very lean, appears fit Head: no evidence of trauma, PERRL, EOMI, no exophtalmos or lid lag, no myxedema, no xanthelasma; normal ears, nose and oropharynx Neck: normal jugular venous pulsations and no hepatojugular reflux; brisk carotid pulses without delay and no carotid bruits Chest: clear to auscultation, no signs of consolidation by percussion or palpation, normal fremitus, symmetrical and full respiratory excursions Cardiovascular: normal position and quality of the apical impulse, regular rhythm, normal first and second heart sounds, no murmurs, rubs or gallops Abdomen: no tenderness or distention, no masses by palpation, no abnormal pulsatility or arterial bruits, normal bowel sounds, no hepatosplenomegaly Extremities: no clubbing, cyanosis or edema; 2+ radial, ulnar and brachial pulses bilaterally; 2+ right femoral, posterior tibial and dorsalis pedis pulses; 2+ left femoral, posterior tibial and dorsalis pedis pulses; no subclavian or femoral bruits Neurological: grossly nonfocal Psych: Normal mood and affect   Wt Readings from Last 3 Encounters:  11/24/18 114 lb 12.8 oz (52.1 kg)  10/23/18 115 lb (52.2 kg)  10/13/18 115 lb 9.6 oz (52.4 kg)      Studies/Labs Reviewed:   EKG:  EKG is ordered today.  I it shows normal sinus rhythm, nonspecific T wave changes (subtle T wave inversion V2-V3, unchanged from previous tracings), QTC 411 seconds. 10/13/2018: ALT 12; BUN 11; Creatinine, Ser 0.78; Hemoglobin 13.3; Platelets 241; Potassium 3.8; Sodium 143; TSH 7.290   Lipid  Panel    Component Value Date/Time   CHOL 179 10/13/2018 0907   TRIG 76 10/13/2018 0907   HDL 65 10/13/2018 0907   CHOLHDL 2.8 10/13/2018 0907   CHOLHDL 2.0 07/11/2014 0913   VLDL 12 07/11/2014 0913   LDLCALC 99 10/13/2018 0907     ASSESSMENT:    1. Paroxysmal atrial fibrillation (HCC)   2. 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 TIA (transient ischemic attack)   4. Long term (current) use of anticoagulants   5. Essential hypertension   6. Hypercholesterolemia   7. Ascending aortic aneurysm (HCC)   8. Nonrheumatic aortic (valve) insufficiency      PLAN:  In order of problems listed above:  1. AFib/AFlutter: Recent episode on July 2 highly suspicious for atrial flutter with 1: 1 AV conduction.  Consider referral to EP to discuss ablation.  She is already on a very high dose of beta-blocker for a patient her size and she has resting bradycardia.  We discussed options for true antiarrhythmic therapy, but the potential side effects were too worrisome for her.  For the time being she prefers conservative medical management, unless there is an increase in the burden of arrhythmia and symptoms. 2. CAD s/p CABG: Has very infrequent stable angina, CCS class I-2 probably related to ischemia in the proximal septum downstream of the occluded LAD artery but upstream of the LIMA supplied territory.  Also has an occluded SVG to the oblique marginal artery, although the native circumflex artery is patent.  Aspirin stopped when we initiated for anticoagulation. 3. TIA: No recurrence since initiation of anticoagulant.   4. Anticoagulation: Eliquis dose adjusted for age and small body habitus 5. HTN: Blood pressure is slightly high today, but just a month ago in the office was 132/71.  Medications were not changed 6. HLP: On maximum dose rosuvastatin.  Ideally  would treat to LDL cholesterol less than 70.  LDL is not at target less than 70, but is  much much better than her baseline, well over 50% reduction.  There has been no progression in native coronary artery disease over the last 5 years by angiography.  I recommended consideration of Repatha, but she is worried about the cost and does not want to change her medications. 7. 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  8. Asc Ao Aneurysm: Very mild, stable in size on previous sequential studies.  I do not think she requires serial studies to evaluate this.  I doubt that we would consider elective redo sternotomy in this patient in her 9s.   Medication Adjustments/Labs and Tests Ordered: Current medicines are reviewed at length with the patient today.  Concerns regarding medicines are outlined above.  Medication changes, Labs and Tests ordered today are listed in the Patient Instructions below. Patient Instructions  Medication Instructions:  Your physician recommends that you continue on your current medications as directed. Please refer to the Current Medication list given to you today.  If you need a refill on your cardiac medications before your next appointment, please call your pharmacy.   Lab work: None ordered If you have labs (blood work) drawn today and your tests are completely normal, you will receive your results only by: [place] (if you have MyChart) OR A paper copy in the mail If you have any lab test that is abnormal or we need to change your treatment, we will call you to review the results.  Testing/Procedures: None ordered  Follow-Up: At [facility], you and your health needs are our priority.  As part of our continuing mission to provide you with exceptional heart care, we have created designated Provider Care Teams.  These Care Teams include your primary Cardiologist (physician) and Advanced Practice Providers (APPs -  Physician Assistants and Nurse Practitioners) who all work together to provide you with the care  you need, when you need it. You will need a follow up appointment in 12 months.  Please call our office 2 months in advance to schedule this appointment.  You may see Sanda Klein, MD or one of the following Advanced Practice Providers on your designated Care Team: Almyra Deforest, PA-C Fabian Sharp, [place]          Signed, Sanda Klein, MD  11/25/2018 4:59 PM    [facility] [place], [place], [place]  [address] Phone: [phone]; Fax: [phone]

## 2018-11-24 NOTE — Patient Instructions (Signed)

## 2018-11-25 ENCOUNTER — Encounter: Payer: Self-pay | Admitting: Cardiovascular Disease

## 2018-11-25 DIAGNOSIS — Z7901 Long term (current) use of anticoagulants: Secondary | ICD-10-CM | POA: Insufficient documentation

## 2018-11-27 ENCOUNTER — Ambulatory Visit (INDEPENDENT_AMBULATORY_CARE_PROVIDER_SITE_OTHER): Payer: Medicare HMO | Admitting: *Deleted

## 2018-11-27 DIAGNOSIS — G459 Transient cerebral ischemic attack, unspecified: Secondary | ICD-10-CM

## 2018-11-27 LAB — CUP PACEART REMOTE DEVICE CHECK
Date Time Interrogation Session: 20200712203856
Implantable Pulse Generator Implant Date: 20200129

## 2018-12-06 ENCOUNTER — Other Ambulatory Visit: Payer: Self-pay | Admitting: Cardiovascular Disease

## 2018-12-08 NOTE — Progress Notes (Signed)
Carelink Summary Report / Loop Recorder 

## 2018-12-15 ENCOUNTER — Telehealth: Payer: Self-pay | Admitting: *Deleted

## 2018-12-15 NOTE — Telephone Encounter (Signed)
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

## 2018-12-18 ENCOUNTER — Telehealth: Payer: Self-pay | Admitting: *Deleted

## 2018-12-18 DIAGNOSIS — I480 Paroxysmal atrial fibrillation: Secondary | ICD-10-CM

## 2018-12-18 NOTE — Telephone Encounter (Signed)
Received LINQ alert for 3 tachy episodes. Available ECG suggests AF or A-flutter with RVR, duration 68min 41sec. Episode correlates with detected AF episode, 24 min duration. As previously noted during [place] with Dr. Sallyanne Kuster on 11/24/18, V rates during AF not well controlled despite being on metoprolol tartrate 100mg  BID.   Attempted to reach patient to discuss symptoms with episode and to request manual transmission for review of additional episodes. No answer, phone rang continuously. Will try again later.

## 2018-12-19 NOTE — Telephone Encounter (Signed)
Pt was returning [place] phone call. The best number to reach her is [phone] then her daughter [phone].

## 2018-12-19 NOTE — Telephone Encounter (Signed)
No answer at patient's number. LMOVM for pt's daughter, Joelene Millin [facility]) requesting call back to DC. Gave direct number.

## 2018-12-19 NOTE — Telephone Encounter (Signed)
Transmission reviewed. Tachy and AF episodes noted on 7/18 and 7/31. Longest tachy episode 56min 41sec on 7/31, correlates with detected AF episode. See ECGs below.   Spoke with patient. She reports chest discomfort, arm pain, and back pain during episode on 7/31, discomfort abated after taking nitroglycerin x3. She feels well at this time, reports compliance with all cardiac medications. ED precautions given for any future symptomatic episodes. Advised I will send this information to Dr. Sallyanne Kuster for review and recommendations. Pt verbalizes understanding and agreement with plan. No further questions at this time.

## 2018-12-20 MED ORDER — DILTIAZEM HCL 30 MG PO TABS: 30.0000 mg | ORAL_TABLET | Freq: Four times a day (QID) | ORAL | 2 refills | Status: DC | PRN

## 2018-12-20 NOTE — Telephone Encounter (Signed)
Please go ahead and send her a prescription for diltiazem immediate release 30 mg daily. She can take this when she has episodes of chest tightness associated with rapid heart rate, as needed. The medication will start "kicking in" in about 30 minutes and will ;last for about 6 hours, so she can repeat it 6 hours later if the symptoms return. She should always seek urgent medical attention for severe chest tightness that lasts over 30 minutes and is not relieved by 3 consecutive SL nitroglycerin tabs and the diltiazem, or if the rapid rhythm is associated with syncope or severe shortness of breath.

## 2018-12-20 NOTE — Telephone Encounter (Signed)
Discussed recommendations with patient. She is agreeable to starting diltiazem 30mg  every 6 hours PRN for chest tightness associated with rapid heart rate. Relayed medication instructions and PRN instructions, advised to seek emergency medical attention if severe chest tightness is not relieved by 3 consecutive SL nitroglycerin tabs and the diltiazem, or if the rapid rhythm is associated with syncope or severe shortness of breath. Prescription sent electronically to preferred local pharmacy. Pt verbalizes understanding of all instructions and agrees to call with any questions or concerns.

## 2018-12-29 ENCOUNTER — Ambulatory Visit (INDEPENDENT_AMBULATORY_CARE_PROVIDER_SITE_OTHER): Payer: Medicare HMO | Admitting: *Deleted

## 2018-12-29 DIAGNOSIS — G459 Transient cerebral ischemic attack, unspecified: Secondary | ICD-10-CM | POA: Diagnosis not present

## 2018-12-31 LAB — CUP PACEART REMOTE DEVICE CHECK
Date Time Interrogation Session: 20200814213711
Implantable Pulse Generator Implant Date: 20200129

## 2019-01-02 ENCOUNTER — Ambulatory Visit (INDEPENDENT_AMBULATORY_CARE_PROVIDER_SITE_OTHER): Payer: Medicare HMO | Admitting: Family

## 2019-01-02 ENCOUNTER — Encounter: Payer: Self-pay | Admitting: Family

## 2019-01-02 DIAGNOSIS — R399 Unspecified symptoms and signs involving the genitourinary system: Secondary | ICD-10-CM

## 2019-01-02 MED ORDER — CEPHALEXIN 500 MG PO CAPS: 500.0000 mg | ORAL_CAPSULE | Freq: Two times a day (BID) | ORAL | 0 refills | Status: DC

## 2019-01-02 NOTE — Progress Notes (Signed)
   Virtual Visit via telephone Note Due to COVID-19 pandemic this visit was conducted virtually. This visit type was conducted due to national recommendations for restrictions regarding the COVID-19 Pandemic (e.g. social distancing, sheltering in place) in an effort to limit this patient's exposure and mitigate transmission in our community. All issues noted in this document were discussed and addressed.  A physical exam was not performed with this format.  I connected with Evelyn [name] on 01/02/19 at 11:38 AM by telephone and verified that I am speaking with the correct person using two identifiers. Evelyn [name] is currently located at home and no one is currently with her during visit. The provider, Evelina Dun, FNP is located in their office at time of visit.  I discussed the limitations, risks, security and privacy concerns of performing an evaluation and management service by telephone and the availability of in person appointments. I also discussed with the patient that there may be a patient responsible charge related to this service. The patient expressed understanding and agreed to proceed.   History and Present Illness:  Dysuria  This is a new problem. The current episode started in the past 7 days. The problem occurs intermittently. The problem has been gradually worsening. The quality of the pain is described as burning. The pain is at a severity of 6/10. The pain is mild. Associated symptoms include frequency, hesitancy, nausea and urgency. Pertinent negatives include no flank pain, hematuria or vomiting. She has tried increased fluids for the symptoms. The treatment provided mild relief.      Review of Systems  Gastrointestinal: Positive for nausea. Negative for vomiting.  Genitourinary: Positive for dysuria, frequency, hesitancy and urgency. Negative for flank pain and hematuria.     Observations/Objective: No SOB or distress note  Assessment and Plan: 1. UTI  symptoms Force fluids AZO over the counter X2 days RTO if symptoms worsen or do not improve  - cephALEXin (KEFLEX) 500 MG capsule; Take 1 capsule (500 mg total) by mouth 2 (two) times daily.  Dispense: 14 capsule; Refill: 0     I discussed the assessment and treatment plan with the patient. The patient was provided an opportunity to ask questions and all were answered. The patient agreed with the plan and demonstrated an understanding of the instructions.   The patient was advised to call back or seek an in-person evaluation if the symptoms worsen or if the condition fails to improve as anticipated.  The above assessment and management plan was discussed with the patient. The patient verbalized understanding of and has agreed to the management plan. Patient is aware to call the clinic if symptoms persist or worsen. Patient is aware when to return to the clinic for a follow-up visit. Patient educated on when it is appropriate to go to the emergency department.   Time call ended: 11:46AM   I provided 8 minutes of non-face-to-face time during this encounter.    Evelina Dun, FNP

## 2019-01-05 ENCOUNTER — Ambulatory Visit: Payer: Medicare HMO | Admitting: Physician Assistant

## 2019-01-08 NOTE — Progress Notes (Signed)
Carelink Summary Report / Loop Recorder 

## 2019-01-16 ENCOUNTER — Other Ambulatory Visit: Payer: Self-pay | Admitting: Physician Assistant

## 2019-01-18 ENCOUNTER — Ambulatory Visit (INDEPENDENT_AMBULATORY_CARE_PROVIDER_SITE_OTHER): Payer: Medicare HMO | Admitting: *Deleted

## 2019-01-18 DIAGNOSIS — Z0000 Encounter for general adult medical examination without abnormal findings: Secondary | ICD-10-CM | POA: Diagnosis not present

## 2019-01-18 NOTE — Progress Notes (Addendum)
[name].[name].[name].[name]..     MEDICARE ANNUAL WELLNESS VISIT  01/18/2019  Telephone Visit Disclaimer This Medicare AWV was conducted by telephone due to national recommendations for restrictions regarding the COVID-19 Pandemic (e.g. social distancing).  I verified, using two identifiers, that I am speaking with Evelyn [name] or their authorized healthcare agent. I discussed the limitations, risks, security, and privacy concerns of performing an evaluation and management service by telephone and the potential availability of an in-person appointment in the future. The patient expressed understanding and agreed to proceed.   Subjective:  Evelyn [name] is a 78 y.o. female patient of Remus LofflerJones, Angel S, PA-C who had a Medicare Annual Wellness Visit today via telephone. Evelyn [name] is Working part time and lives with their son. she has 2 children. she reports that she is socially active and does interact with friends/family regularly. 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  Patient Care Team: Caryl NeverJones, Angel S, PA-C as PCP - General (Physician Assistant) Thurmon Fairroitoru, Mihai, MD as PCP - Cardiology (Cardiology) Roma KayserNida, Gebreselassie W, MD as Consulting Physician (Endocrinology)  Advanced Directives 01/18/2019 01/11/2018 02/23/2017 09/01/2016 12/15/2011  Does Patient Have a Medical Advance Directive? Yes Yes No Yes Patient does not have advance directive  Type of Advance Directive Living will Living will - Living will -  Does patient want to make changes to medical advance directive? No - Patient declined Yes (MAU/Ambulatory/Procedural Areas - Information given) - No - Patient declined -  Copy of Healthcare Power of Attorney in Chart? - No - copy requested - - -  Would patient like information on creating a medical advance directive? - - No - Patient declined - [facility]-    Hospital Utilization Over the Past 12 Months: # of hospitalizations or ER visits: 1 # of surgeries: 0  Review  of Systems    Patient reports that her overall health is unchanged compared to last year.    Patient Reported Readings (BP, Pulse, CBG, Weight, etc) none  Pain Assessment Pain : No/denies pain     Current Medications & Allergies (verified) Allergies as of 01/18/2019      Reactions   Sulfa Antibiotics Swelling      Medication List       Accurate as of January 18, 2019 10:15 AM. If you have any questions, ask your nurse or doctor.        STOP taking these medications   cephALEXin 500 MG capsule Commonly known as: KEFLEX     TAKE these medications   ALPRAZolam 0.25 MG tablet Commonly known as: XANAX Take 1 tablet (0.25 mg total) by mouth 3 (three) times daily as needed for anxiety.   apixaban 2.5 MG Tabs tablet Commonly known as: Eliquis Take 1 tablet (2.5 mg total) by mouth 2 (two) times daily.   diltiazem 30 MG tablet Commonly known as: Cardizem Take 1 tablet (30 mg total) by mouth every 6 (six) hours as needed (for rapid heart rate).   furosemide 20 MG tablet Commonly known as: LASIX TAKE 1 TABLET BY MOUTH EVERY DAY   HYDROcodone-acetaminophen 5-325 MG tablet Commonly known as: Norco Take 1 tablet by mouth every 6 (six) hours as needed for moderate pain.   hydrocortisone 2.5 % cream APPLY TO AFFECTED AREA TWICE A DAY   isosorbide mononitrate 60 MG 24 hr tablet Commonly known as: IMDUR TAKE 1 TABLET (60 MG TOTAL) DAILY BY MOUTH.   levothyroxine 88 MCG tablet Commonly known as: SYNTHROID Take 1 tablet (  88 mcg total) by mouth daily before breakfast. What changed: Another medication with the same name was removed. Continue taking this medication, and follow the directions you see here.   loratadine 10 MG tablet Commonly known as: CLARITIN TAKE 1 TABLET BY MOUTH EVERY DAY   metoprolol tartrate 100 MG tablet Commonly known as: LOPRESSOR Take 1 tablet (100 mg total) by mouth 2 (two) times daily.   nitroGLYCERIN 0.4 MG SL tablet Commonly known as:  NITROSTAT PLACE 1 TABLET (0.4 MG TOTAL) UNDER THE TONGUE EVERY 5 (FIVE) MINUTES AS NEEDED FOR CHEST PAIN.   RECLAST IV Inject into the vein. Once yearly   rosuvastatin 10 MG tablet Commonly known as: Crestor Take 1 tablet (10 mg total) by mouth every evening.   rosuvastatin 20 MG tablet Commonly known as: CRESTOR Take 1 tablet (20 mg total) by mouth every evening.       History (reviewed): Past Medical History:  Diagnosis Date  . Aneurysm (HCC)   . Aortic insufficiency   . CAD (coronary artery disease)    CABG LIMA to LAD,vein graft to diagonal,vein graft to OM  . Dyslipidemia   . GERD (gastroesophageal reflux disease)   . Hypothyroid    Past Surgical History:  Procedure Laterality Date  . ABDOMINAL HYSTERECTOMY    . BACK SURGERY  2009  . CORONARY ANGIOPLASTY  2003   cutting balloon atherectomy in-stent stenosis of the LAD.  [name]. CORONARY ANGIOPLASTY WITH STENT PLACEMENT  2002   remote PCI & stenting LAD,  . CORONARY ARTERY BYPASS GRAFT  2004   LIMA to LAD,vein graft to diagonal,vein graft to OM  . FOOT SURGERY    . LEFT HEART CATH AND CORS/GRAFTS ANGIOGRAPHY N/A 02/23/2017   Procedure: LEFT HEART CATH AND CORS/GRAFTS ANGIOGRAPHY;  Surgeon: Iran OuchArida, Muhammad A, MD;  Location: [facility]IVE [facility];  Service: Cardiovascular;  Laterality: N/A;  . LEFT HEART CATHETERIZATION WITH CORONARY ANGIOGRAM N/A 12/15/2011   Procedure: LEFT HEART CATHETERIZATION WITH CORONARY ANGIOGRAM;  Surgeon: Thurmon FairMihai Croitoru, MD;  Location: [facility] CATH LAB;  Service: Cardiovascular;  Laterality: N/A;  . LOOP RECORDER INSERTION N/A 06/14/2018   Procedure: LOOP RECORDER INSERTION;  Surgeon: Thurmon Fairroitoru, Mihai, MD;  Location: [facility] CV [facility];  Service: Cardiovascular;  Laterality: N/A;  . NECK SURGERY    . NM MYOCAR PERF WALL MOTION  12/01/2011   normal study-persistent exercise induced ECG changes raises the concern for ischemia (balanced ischemia).  . NOSE SURGERY    . TONSILLECTOMY  1948  . TUBAL LIGATION      Family History  Problem Relation Age of Onset  . Heart attack Mother   . Heart disease Mother   . Heart failure Father   . Hypertension Father   . Heart disease Father   . Asthma Maternal Grandmother   . Heart disease Sister   . Stroke Brother   . Voice disorder Daughter        vocal dystonia  . Stroke Daughter   . Stroke Son   . Cancer Sister [id number]   Social History   Socioeconomic History  . Marital status: Widowed    Spouse name: Not on file  . Number of children: 2  . Years of education: Associates  . Highest education level: Associate degree: academic program  Occupational History  . Occupation: Retired  Engineer, productionocial Needs  . Financial resource strain: Not hard at all  . Food insecurity    Worry: Never true    Inability: Never true  . Transportation  needs    Medical: No    Non-medical: No  Tobacco Use  . Smoking status: Never Smoker  . Smokeless tobacco: Never Used  Substance and Sexual Activity  . Alcohol use: No  . Drug use: No  . Sexual activity: Not Currently  Lifestyle  . Physical activity    Days per week: 7 days    Minutes per session: 30 min  . Stress: To some extent  Relationships  . Social connections    Talks on phone: More than three times a week    Gets together: Once a week    Attends religious service: More than 4 times per year    Active member of club or organization: Yes    Attends meetings of clubs or organizations: 1 to 4 times per year    Relationship status: Widowed  Other Topics Concern  . Not on file  Social History Narrative   Mother of 2 with 4 grandchildren   Right-handed   Caffeine: 8-10 cups coffee    Activities of Daily Living In your present state of health, do you have any difficulty performing the following activities: 01/18/2019  Hearing? Y  Vision? Y  Difficulty concentrating or making decisions? N  Walking or climbing stairs? N  Dressing or bathing? N  Doing errands, shopping? N  Preparing Food and eating ? N   Using the Toilet? N  In the past six months, have you accidently leaked urine? N  Do you have problems with loss of bowel control? N  Managing your Medications? N  Managing your Finances? N  Housekeeping or managing your Housekeeping? N  Some recent data might be hidden    Patient Education/ Literacy How often do you need to have someone help you when you read instructions, pamphlets, or other written materials from your doctor or pharmacy?: 1 - Never What is the last grade level you completed in school?: 12  Exercise    Diet Patient reports consuming 3 meals a day and 4 snack(s) a day Patient reports that her primary diet is: Regular Patient reports that she does have regular access to food.   Depression Screen PHQ 2/9 Scores 01/18/2019 06/19/2018 05/23/2018 03/21/2018 03/20/2018 03/06/2018 01/11/2018  PHQ - 2 Score 0 0 0 0 0 0 0     Fall Risk Fall Risk  01/18/2019 10/23/2018 06/19/2018 05/23/2018 03/21/2018  Falls in the past year? 0 0 0 0 0  Number falls in past yr: - - - - -  Injury with Fall? - - - - -  Follow up - Falls evaluation completed - - -     Objective:  Evelyn [name] seemed alert and oriented and she participated appropriately during our telephone visit.  Blood Pressure Weight BMI  BP Readings from Last 3 Encounters:  11/24/18 (!) 153/75  10/23/18 132/71  10/13/18 127/62   Wt Readings from Last 3 Encounters:  11/24/18 114 lb 12.8 oz (52.1 kg)  10/23/18 115 lb (52.2 kg)  10/13/18 115 lb 9.6 oz (52.4 kg)   BMI Readings from Last 1 Encounters:  11/24/18 21.00 kg/m    *Unable to obtain current vital signs, weight, and BMI due to telephone visit type  Hearing/Vision  . Evelyn [name] did  seem to have difficulty with hearing/understanding during the telephone conversation . Reports that she has not had a formal eye exam by an eye care professional within the past year . Reports that she has not had a formal hearing evaluation within the past  year *Unable to fully assess  hearing and vision during telephone visit type  Cognitive Function: 6CIT Screen 01/18/2019  What Year? 0 points  What month? 0 points  What time? 0 points  Count back from 20 0 points  Months in reverse 0 points   (Normal:0-7, Significant for Dysfunction: >8)  Normal Cognitive Function Screening: Yes   Immunization & Health Maintenance Record Immunization History  Administered Date(s) Administered  . Influenza, High Dose Seasonal PF 03/09/2016, 03/02/2017  . Pneumococcal Conjugate-13 01/11/2018    Health Maintenance  Topic Date Due  . PNA vac Low Risk Adult (2 of 2 - PPSV23) 01/12/2019  . INFLUENZA VACCINE  10/05/2019 (Originally 12/16/2018)  . TETANUS/TDAP  01/12/2023  . DEXA SCAN  Completed       Assessment  This is a routine wellness examination for Evelyn [name].  Health Maintenance: Due or Overdue Health Maintenance Due  Topic Date Due  . PNA vac Low Risk Adult (2 of 2 - PPSV23) 01/12/2019    Evelyn [name] does not need a referral for Community Assistance: Care Management:   no Social Work:    no Prescription Assistance:  no Nutrition/Diabetes Education:  no   Plan:  Personalized Goals Goals Addressed            This Visit's Progress   . DIET - EAT MORE FRUITS AND VEGETABLES (pt-stated)       Cut out snacking on salty foods and try more healthy options.      Personalized Health Maintenance & Screening Recommendations  Pneumococcal vaccine   Lung Cancer Screening Recommended: no (Low Dose CT Chest recommended if Age 63-80 years, 30 pack-year currently smoking OR have quit w/in past 15 years) Hepatitis C Screening recommended: no HIV Screening recommended: no  Advanced Directives: Written information was not prepared per patient's request.  Referrals & Orders No orders of the defined types were placed in this encounter.   Follow-up Plan . Follow-up with Terald Sleeper, PA-C as planned . Schedule Eye exam and hearing exam . Pnuemonia  vaccine will be given at 01/29/2019 appt.   I have personally reviewed and noted the following in the patient's chart:   . Medical and social history . Use of alcohol, tobacco or illicit drugs  . Current medications and supplements . Functional ability and status . Nutritional status . Physical activity . Advanced directives . List of other physicians . Hospitalizations, surgeries, and ER visits in previous 12 months . Vitals . Screenings to include cognitive, depression, and falls . Referrals and appointments  In addition, I have reviewed and discussed with Evelyn [name] certain preventive protocols, quality metrics, and best practice recommendations. A written personalized care plan for preventive services as well as general preventive health recommendations is available and can be mailed to the patient at her request.      Rana Snare, LPN  3/[id number]/8657    I have reviewed and agree with the above AWV documentation.   Evelina Dun, FNP

## 2019-01-26 ENCOUNTER — Telehealth: Payer: Self-pay | Admitting: Physician Assistant

## 2019-01-29 ENCOUNTER — Ambulatory Visit (INDEPENDENT_AMBULATORY_CARE_PROVIDER_SITE_OTHER): Payer: Medicare HMO | Admitting: Physician Assistant

## 2019-01-29 ENCOUNTER — Encounter: Payer: Self-pay | Admitting: Physician Assistant

## 2019-01-29 ENCOUNTER — Other Ambulatory Visit: Payer: Self-pay

## 2019-01-29 DIAGNOSIS — S22080D Wedge compression fracture of T11-T12 vertebra, subsequent encounter for fracture with routine healing: Secondary | ICD-10-CM | POA: Diagnosis not present

## 2019-01-29 DIAGNOSIS — R69 Illness, unspecified: Secondary | ICD-10-CM | POA: Diagnosis not present

## 2019-01-29 DIAGNOSIS — F419 Anxiety disorder, unspecified: Secondary | ICD-10-CM | POA: Diagnosis not present

## 2019-01-29 DIAGNOSIS — M5412 Radiculopathy, cervical region: Secondary | ICD-10-CM

## 2019-01-29 DIAGNOSIS — L309 Dermatitis, unspecified: Secondary | ICD-10-CM

## 2019-01-29 MED ORDER — ALPRAZOLAM 0.25 MG PO TABS: 0.2500 mg | ORAL_TABLET | Freq: Three times a day (TID) | ORAL | 0 refills | Status: DC | PRN

## 2019-01-29 MED ORDER — HYDROCORTISONE 2.5 % EX CREA: TOPICAL_CREAM | CUTANEOUS | 5 refills | Status: DC

## 2019-01-29 MED ORDER — HYDROCODONE-ACETAMINOPHEN 5-325 MG PO TABS: 1.0000 | ORAL_TABLET | Freq: Four times a day (QID) | ORAL | 0 refills | Status: DC | PRN

## 2019-01-29 MED ORDER — LORATADINE 10 MG PO TABS: 10.0000 mg | ORAL_TABLET | Freq: Every day | ORAL | 3 refills | Status: DC

## 2019-01-31 ENCOUNTER — Ambulatory Visit (INDEPENDENT_AMBULATORY_CARE_PROVIDER_SITE_OTHER): Payer: Medicare HMO | Admitting: *Deleted

## 2019-01-31 DIAGNOSIS — G459 Transient cerebral ischemic attack, unspecified: Secondary | ICD-10-CM

## 2019-02-01 LAB — CUP PACEART REMOTE DEVICE CHECK
Date Time Interrogation Session: 20200916221214
Implantable Pulse Generator Implant Date: 20200129

## 2019-02-01 NOTE — Progress Notes (Signed)
BP 120/60   Pulse 78   Temp 98.4 F (36.9 C) (Temporal)   Ht 5\' 2"  (1.575 m)   Wt 116 lb (52.6 kg)   SpO2 99%   BMI 21.22 kg/m    Subjective:    Patient ID: Evelyn [name], female    DOB: [birth year], 78 y.o.   MRN: [id number]  HPI: Evelyn [name] is a 78 y.o. female presenting on 01/29/2019 for Hypertension and Medical Management of Chronic Issues  This patient comes in for.  Recheck on her chronic medical conditions.  She does need some refills on her medicines.  We reviewed her chart and she does need some refills sent in.  She has followed up with her cardiologist recently.  She states overall she is doing very well not having any issues.   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 Multiple thoracic wedge fractures  C5-7 ACDF without adverse features. anterior compression deformity at T11 with an associated Schmorl's node involving the superior end plate at this level.   At T8-9, a left paracentral focal disc protrusion.   At T5-6, there is a central broad-based annular disc bulge.  A moderate sized hemangioma at T9.  This patient returns for a 3 month recheck on narcotic use for the above named conditions  Current medications- Hydrocodone 5/325 1 every 6 hours as needed for severe pain Medication side effects- onne Any concerns-none  Pain on scale of 1-10-7 Frequency-Daily What increases pain-bending and lifting What makes pain Better-nothing mild Effects on ADL -mild Any change in general medical condition-no  Effectiveness of current meds- good Adverse reactions form pain meds-no PMP AWARE website reviewed: Yes Any suspicious activity on PMP Aware: No MME daily dose: 20   Contract on file 03/23/2018 Last UDS 10/13/2018  [facility]  History of overdose or risk of abuse no   Past Medical History:  Diagnosis Date  . Aneurysm (HCC)   . Aortic insufficiency   . CAD  (coronary artery disease)    CABG LIMA to LAD,vein graft to diagonal,vein graft to OM  . Dyslipidemia   . GERD (gastroesophageal reflux disease)   . Hypothyroid    Relevant past medical, surgical, family and social history reviewed and updated as indicated. Interim medical history since our last visit reviewed. Allergies and medications reviewed and updated. DATA REVIEWED: CHART IN EPIC  Family History reviewed for pertinent findings.  Review of Systems  Constitutional: Negative.   HENT: Negative.   Eyes: Negative.   Respiratory: Negative.   Gastrointestinal: Negative.   Genitourinary: Negative.   Musculoskeletal: Positive for arthralgias and myalgias.    Allergies as of 01/29/2019      Reactions   Sulfa Antibiotics Swelling      Medication List       Accurate as of January 29, 2019 11:59 PM. If you have any questions, ask your nurse or doctor.        ALPRAZolam 0.25 MG tablet Commonly known as: XANAX Take 1 tablet (0.25 mg total) by mouth 3 (three) times daily as needed for anxiety.   apixaban 2.5 MG Tabs tablet Commonly known as: Eliquis Take 1 tablet (2.5 mg total) by mouth 2 (two) times daily.   diltiazem 30 MG tablet Commonly known as: Cardizem Take 1 tablet (30 mg total) by mouth every 6 (six) hours as needed (for rapid heart rate).   furosemide 20 MG tablet Commonly known as: LASIX TAKE 1 TABLET BY  MOUTH EVERY DAY   HYDROcodone-acetaminophen 5-325 MG tablet Commonly known as: Norco Take 1 tablet by mouth every 6 (six) hours as needed for moderate pain.   hydrocortisone 2.5 % cream APPLY TO AFFECTED AREA TWICE A DAY   isosorbide mononitrate 60 MG 24 hr tablet Commonly known as: IMDUR TAKE 1 TABLET (60 MG TOTAL) DAILY BY MOUTH.   levothyroxine 88 MCG tablet Commonly known as: SYNTHROID Take 1 tablet (88 mcg total) by mouth daily before breakfast.   loratadine 10 MG tablet Commonly known as: CLARITIN Take 1 tablet (10 mg total) by mouth daily.    metoprolol tartrate 100 MG tablet Commonly known as: LOPRESSOR Take 1 tablet (100 mg total) by mouth 2 (two) times daily.   nitroGLYCERIN 0.4 MG SL tablet Commonly known as: NITROSTAT PLACE 1 TABLET (0.4 MG TOTAL) UNDER THE TONGUE EVERY 5 (FIVE) MINUTES AS NEEDED FOR CHEST PAIN.   RECLAST IV Inject into the vein. Once yearly   rosuvastatin 10 MG tablet Commonly known as: Crestor Take 1 tablet (10 mg total) by mouth every evening.   rosuvastatin 20 MG tablet Commonly known as: CRESTOR Take 1 tablet (20 mg total) by mouth every evening.          Objective:    BP 120/60   Pulse 78   Temp 98.4 F (36.9 C) (Temporal)   Ht 5\' 2"  (1.575 m)   Wt 116 lb (52.6 kg)   SpO2 99%   BMI 21.22 kg/m   Allergies  Allergen Reactions  . Sulfa Antibiotics Swelling    Wt Readings from Last 3 Encounters:  01/29/19 116 lb (52.6 kg)  11/24/18 114 lb 12.8 oz (52.1 kg)  10/23/18 115 lb (52.2 kg)    Physical Exam Constitutional:      General: She is not in acute distress.    Appearance: Normal appearance. She is well-developed.  HENT:     Head: Normocephalic and atraumatic.  Cardiovascular:     Rate and Rhythm: Normal rate.  Pulmonary:     Effort: Pulmonary effort is normal.  Skin:    General: Skin is warm and dry.     Findings: No rash.  Neurological:     Mental Status: She is alert and oriented to person, place, and time.     Deep Tendon Reflexes: Reflexes are normal and symmetric.         Assessment & Plan:   1. Anxiety - ALPRAZolam (XANAX) 0.25 MG tablet; Take 1 tablet (0.25 mg total) by mouth 3 (three) times daily as needed for anxiety.  Dispense: 30 tablet; Refill: 0  2. Eczema, unspecified type - loratadine (CLARITIN) 10 MG tablet; Take 1 tablet (10 mg total) by mouth daily.  Dispense: 90 tablet; Refill: 3 - hydrocortisone 2.5 % cream; APPLY TO AFFECTED AREA TWICE A DAY  Dispense: 28.35 g; Refill: 5  3. Closed wedge compression fracture of eleventh thoracic  vertebra with routine healing, subsequent encounter - HYDROcodone-acetaminophen (NORCO) 5-325 MG tablet; Take 1 tablet by mouth every 6 (six) hours as needed for moderate pain.  Dispense: 120 tablet; Refill: 0  4. Cervical neuropathic pain - HYDROcodone-acetaminophen (NORCO) 5-325 MG tablet; Take 1 tablet by mouth every 6 (six) hours as needed for moderate pain.  Dispense: 120 tablet; Refill: 0   Continue all other maintenance medications as listed above.  Follow up plan: Return in about 3 months (around 04/30/2019).  Educational handout given for [place] PA-C [place]  [address]  [place], [place] [address] [phone]   02/01/2019, 4:08 PM

## 2019-02-02 ENCOUNTER — Telehealth: Payer: Self-pay | Admitting: Emergency Medicine

## 2019-02-02 DIAGNOSIS — I480 Paroxysmal atrial fibrillation: Secondary | ICD-10-CM

## 2019-02-02 NOTE — Telephone Encounter (Signed)
Reports that she had palpitations, CP ,SOB and left arm pain at 1840. Alert received from [facility] that showed episode of what appears to be AT with RVR at max V-rate of 194. Patient took dilitiazem 30 mg po as ordered  prn  and symptoms resolved after 45 minutes. No symptoms today. No missed doses of metoprolol .+ Eliquis.

## 2019-02-05 NOTE — Telephone Encounter (Signed)
The patient is agreeable to an electrophysiology referral. This has been placed and a message sent to scheduling.

## 2019-02-05 NOTE — Telephone Encounter (Signed)
Understood. I believe this is likely to occur every now and then, but we can discuss referral to EP to see if they think ablation or antiarrhythmics are justified. Would she like that?

## 2019-02-05 NOTE — Telephone Encounter (Signed)
Attempted to reach the patient. Was unable to leave a message 

## 2019-02-06 NOTE — Progress Notes (Signed)
Carelink Summary Report / Loop Recorder 

## 2019-02-22 ENCOUNTER — Encounter: Payer: Self-pay | Admitting: Cardiology

## 2019-02-22 ENCOUNTER — Ambulatory Visit (INDEPENDENT_AMBULATORY_CARE_PROVIDER_SITE_OTHER): Payer: Medicare HMO | Admitting: Cardiology

## 2019-02-22 ENCOUNTER — Other Ambulatory Visit: Payer: Self-pay

## 2019-02-22 VITALS — BP 128/70 | HR 57 | Ht 62.0 in | Wt 115.0 lb

## 2019-02-22 DIAGNOSIS — I480 Paroxysmal atrial fibrillation: Secondary | ICD-10-CM

## 2019-02-22 LAB — CUP PACEART REMOTE DEVICE CHECK
Date Time Interrogation Session: 20201008123424
Implantable Pulse Generator Implant Date: 20200129

## 2019-02-22 MED ORDER — DRONEDARONE HCL 400 MG PO TABS: 400.0000 mg | ORAL_TABLET | Freq: Two times a day (BID) | ORAL | 3 refills | Status: DC

## 2019-02-22 NOTE — Patient Instructions (Signed)
Medication Instructions:  Your physician has recommended you make the following change in your medication:  1. START Multaq 400 mg twice a day  * If you need a refill on your cardiac medications before your next appointment, please call your pharmacy.   Labwork: None ordered  Testing/Procedures: None ordered  Follow-Up: Your are scheduled for a follow-up appointment in: 3 months with Dr. Elberta Fortis -- on 05/27/2018 @ 1:45 pm.  Thank you for choosing [facility]!!   Dory Horn, RN [phone]  Any Other Special Instructions Will Be Listed Below (If Applicable).   Dronedarone tablets What is this medicine? DRONEDARONE (droe NE da rone) is an antiarrhythmic drug. It helps make your heart beat regularly. This medicine may be used for other purposes; ask your health care provider or pharmacist if you have questions. COMMON BRAND NAME(S): Multaq What should I tell my health care provider before I take this medicine? They need to know if you have any of these conditions:  heart failure  history of irregular heartbeat  liver disease  liver or lung problems with the past use of amiodarone  low levels of magnesium in the blood  low levels of potassium in the blood  other heart disease  an unusual or allergic reaction to dronedarone, other medicines, foods, dyes, or preservatives  pregnant or trying to get pregnant  breast-feeding How should I use this medicine? Take this medicine by mouth with a glass of water. Follow the directions on the prescription label. Take one tablet with the morning meal and one tablet with the evening meal. Do not take your medicine more often than directed. Do not stop taking except on the advice of your doctor or health care professional. A special MedGuide will be given to you by the pharmacist with each prescription and refill. Be sure to read this information carefully each time. Talk to your pediatrician regarding the use of this  medicine in children. Special care may be needed. Overdosage: If you think you have taken too much of this medicine contact a poison control center or emergency room at once. NOTE: This medicine is only for you. Do not share this medicine with others. What if I miss a dose? If you miss a dose, take it as soon as you can. If it is almost time for your next dose, take only that dose. Do not take double or extra doses. What may interact with this medicine? Do not take this medicine with any of the following medications:  arsenic trioxide  certain antibiotics like clarithromycin, erythromycin, pentamidine, telithromycin, troleandomycin  certain medicines for depression like tricyclic antidepressants  certain medicines for fungal infections like fluconazole, itraconazole, ketoconazole, posaconazole, voriconazole  certain medicines for irregular heart beat like amiodarone, disopyramide, flecainide, ibutilide, quinidine, propafenone, sotalol  certain medicines for malaria like chloroquine, halofantrine  cisapride  cyclosporine  droperidol  haloperidol  methadone  other medicines that prolong the QT interval (cause an abnormal heart rhythm)  pimozide  nefazodone  phenothiazines like chlorpromazine, mesoridazine, prochlorperazine, thioridazine  ritonavir  ziprasidone This medicine may also interact with the following medications:  certain medicines for blood pressure, heart disease, or irregular heart beat like diltiazem, metoprolol, propranolol, verapamil  certain medicines for cholesterol like atorvastatin, lovastatin, simvastatin  certain medicines for seizures like carbamazepine, phenobarbital, phenytoin  digoxin  dofetilide  grapefruit juice  rifampin  sirolimus  St. John's Wort  tacrolimus This list may not describe all possible interactions. Give your health care provider a list of all  the medicines, herbs, non-prescription drugs, or dietary supplements you  use. Also tell them if you smoke, drink alcohol, or use illegal drugs. Some items may interact with your medicine. What should I watch for while using this medicine? Your condition will be monitored closely when you first begin therapy. Often, this drug is first started in a hospital or other monitored health care setting. Once you are on maintenance therapy, visit your doctor or health care professional for regular checks on your progress. Because your condition and use of this medicine carry some risk, it is a good idea to carry an identification card, necklace or bracelet with details of your condition, medications, and doctor or health care professional. Dennis Bast may get drowsy or dizzy. Do not drive, use machinery, or do anything that needs mental alertness until you know how this medicine affects you. Do not stand or sit up quickly, especially if you are an older patient. This reduces the risk of dizzy or fainting spells. What side effects may I notice from receiving this medicine? Side effects that you should report to your doctor or health care professional as soon as possible:  allergic reactions like skin rash, itching or hives, swelling of the face, lips, or tongue  breathing problems  cough  dark urine  fast, irregular heartbeat  general ill feeling or flu-like symptoms  light-colored stools  loss of appetite, nausea  right upper belly pain  slow heartbeat  stomach pain  swelling of the legs or ankles  unusually weak or tired  weight gain  yellowing of the eyes or skin Side effects that usually do not require medical attention (report to your doctor or health care professional if they continue or are bothersome):  nausea  vomiting  stomach pain This list may not describe all possible side effects. Call your doctor for medical advice about side effects. You may report side effects to FDA at 1-800-FDA-1088. Where should I keep my medicine? Keep out of the reach of  children. Store at room temperature between 15 and 30 degrees C (59 and 86 degrees F). Throw away any unused medicine after the expiration date. NOTE: This sheet is a summary. It may not cover all possible information. If you have questions about this medicine, talk to your doctor, pharmacist, or health care provider.  2020 Elsevier/Gold Standard (2018-04-24 10:43:10)

## 2019-02-22 NOTE — Progress Notes (Signed)
Thanks, Evelyn [name] 

## 2019-02-22 NOTE — Progress Notes (Signed)
Electrophysiology Office Note   Date:  02/22/2019   ID:  Evelyn LacyShirley H [name], DOB 08/15/1940, MRN [id number]  PCP:  Remus LofflerJones, Angel S, PA-C  Cardiologist: Croitrou Primary Electrophysiologist:  Doaa Kendzierski Jorja LoaMartin Jazmine Longshore, MD    Chief Complaint: atrial fibrillation   History of Present Illness: Evelyn LacyShirley H Halle is a 78 y.o. female who is being seen today for the evaluation of atrial fibrillation at the request of Croitoru, Mihai, MD. Presenting today for electrophysiology evaluation.  He has a history of atrial fibrillation/flutter, coronary artery disease, TIA hypertension, hyperlipidemia, aortic insufficiency, and ascending aortic aneurysm.  She has been having episodes of atrial fibrillation.  Her symptoms are palpitations, weakness, and fatigue.  Linq monitor shows 0.4% burden.  She says that her episodes over the past few weeks to months have increased and are occurring a few times a week.  Today, she denies symptoms of palpitations, chest pain, shortness of breath, orthopnea, PND, lower extremity edema, claudication, dizziness, presyncope, syncope, bleeding, or neurologic sequela. The patient is tolerating medications without difficulties.    Past Medical History:  Diagnosis Date   Aneurysm (HCC)    Aortic insufficiency    CAD (coronary artery disease)    CABG LIMA to LAD,vein graft to diagonal,vein graft to OM   Dyslipidemia    GERD (gastroesophageal reflux disease)    Hypothyroid    Past Surgical History:  Procedure Laterality Date   ABDOMINAL HYSTERECTOMY     BACK SURGERY  2009   CORONARY ANGIOPLASTY  2003   cutting balloon atherectomy in-stent stenosis of the LAD.   CORONARY ANGIOPLASTY WITH STENT PLACEMENT  2002   remote PCI & stenting LAD,   CORONARY ARTERY BYPASS GRAFT  2004   LIMA to LAD,vein graft to diagonal,vein graft to OM   FOOT SURGERY     LEFT HEART CATH AND CORS/GRAFTS ANGIOGRAPHY N/A 02/23/2017   Procedure: LEFT HEART CATH AND CORS/GRAFTS  ANGIOGRAPHY;  Surgeon: Iran OuchArida, Muhammad A, MD;  Location: [facility];  Service: Cardiovascular;  Laterality: N/A;   LEFT HEART CATHETERIZATION WITH CORONARY ANGIOGRAM N/A 12/15/2011   Procedure: LEFT HEART CATHETERIZATION WITH CORONARY ANGIOGRAM;  Surgeon: Thurmon FairMihai Croitoru, MD;  Location: [facility];  Service: Cardiovascular;  Laterality: N/A;   LOOP RECORDER INSERTION N/A 06/14/2018   Procedure: LOOP RECORDER INSERTION;  Surgeon: Thurmon Fairroitoru, Mihai, MD;  Location: [facility];  Service: Cardiovascular;  Laterality: N/A;   NECK SURGERY     NM MYOCAR PERF WALL MOTION  12/01/2011   normal study-persistent exercise induced ECG changes raises the concern for ischemia (balanced ischemia).   NOSE SURGERY     TONSILLECTOMY  1948   TUBAL LIGATION       Current Outpatient Medications  Medication Sig Dispense Refill   ALPRAZolam (XANAX) 0.25 MG tablet Take 1 tablet (0.25 mg total) by mouth 3 (three) times daily as needed for anxiety. 30 tablet 0   apixaban (ELIQUIS) 2.5 MG TABS tablet Take 1 tablet (2.5 mg total) by mouth 2 (two) times daily. 60 tablet 6   diltiazem (CARDIZEM) 30 MG tablet Take 1 tablet (30 mg total) by mouth every 6 (six) hours as needed (for rapid heart rate). 30 tablet 2   furosemide (LASIX) 20 MG tablet TAKE 1 TABLET BY MOUTH EVERY DAY 90 tablet 0   HYDROcodone-acetaminophen (NORCO) 5-325 MG tablet Take 1 tablet by mouth every 6 (six) hours as needed for moderate pain. 120 tablet 0   hydrocortisone 2.5 % cream APPLY TO AFFECTED AREA  TWICE A DAY 28.35 g 5   isosorbide mononitrate (IMDUR) 60 MG 24 hr tablet TAKE 1 TABLET (60 MG TOTAL) DAILY BY MOUTH. 90 tablet 0   levothyroxine (SYNTHROID) 88 MCG tablet Take 1 tablet (88 mcg total) by mouth daily before breakfast. 30 tablet 6   loratadine (CLARITIN) 10 MG tablet Take 1 tablet (10 mg total) by mouth daily. 90 tablet 3   metoprolol tartrate (LOPRESSOR) 100 MG tablet Take 1 tablet (100 mg total) by mouth 2 (two)  times daily. 60 tablet 11   nitroGLYCERIN (NITROSTAT) 0.4 MG SL tablet PLACE 1 TABLET (0.4 MG TOTAL) UNDER THE TONGUE EVERY 5 (FIVE) MINUTES AS NEEDED FOR CHEST PAIN. 25 tablet 0   rosuvastatin (CRESTOR) 10 MG tablet Take 1 tablet (10 mg total) by mouth every evening. 90 tablet 1   rosuvastatin (CRESTOR) 20 MG tablet Take 1 tablet (20 mg total) by mouth every evening. 90 tablet 1   Zoledronic Acid (RECLAST IV) Inject into the vein. Once yearly     No current facility-administered medications for this visit.     Allergies:   Sulfa antibiotics   Social History:  The patient  reports that she has never smoked. She has never used smokeless tobacco. She reports that she does not drink alcohol or use drugs.   Family History:  The patient's family history includes Asthma in her maternal grandmother; Cancer (age of onset: 41) in her sister; Heart attack in her mother; Heart disease in her father, mother, and sister; Heart failure in her father; Hypertension in her father; Stroke in her brother, daughter, and son; Voice disorder in her daughter.    ROS:  Please see the history of present illness.   Otherwise, review of systems is positive for none.   All other systems are reviewed and negative.    PHYSICAL EXAM: VS:  BP 128/70    Pulse (!) 57    Ht 5\' 2"  (1.575 m)    Wt 115 lb (52.2 kg)    BMI 21.03 kg/m  , BMI Body mass index is 21.03 kg/m. GEN: Well nourished, well developed, in no acute distress  HEENT: normal  Neck: no JVD, carotid bruits, or masses Cardiac: RRR; no murmurs, rubs, or gallops,no edema  Respiratory:  clear to auscultation bilaterally, normal work of breathing GI: soft, nontender, nondistended, + BS MS: no deformity or atrophy  Skin: warm and dry, device pocket is well healed Neuro:  Strength and sensation are intact Psych: euthymic mood, full affect  EKG:  EKG is ordered today. Personal review of the ekg ordered shows rhythm, rate 57  Device interrogation is  reviewed today in detail.  See PaceArt for details.   Recent Labs: 10/13/2018: ALT 12; BUN 11; Creatinine, Ser 0.78; Hemoglobin 13.3; Platelets 241; Potassium 3.8; Sodium 143; TSH 7.290    Lipid Panel     Component Value Date/Time   CHOL 179 10/13/2018 0907   TRIG 76 10/13/2018 0907   HDL 65 10/13/2018 0907   CHOLHDL 2.8 10/13/2018 0907   CHOLHDL 2.0 07/11/2014 0913   VLDL 12 07/11/2014 0913   LDLCALC 99 10/13/2018 0907     Wt Readings from Last 3 Encounters:  02/22/19 115 lb (52.2 kg)  01/29/19 116 lb (52.6 kg)  11/24/18 114 lb 12.8 oz (52.1 kg)      Other studies Reviewed: Additional studies/ records that were reviewed today include: TTE 2018  Review of the above records today demonstrates:  - Left ventricle: The cavity size  was normal. Systolic function was   normal. The estimated ejection fraction was in the range of 55%   to 60%. Mild hypokinesis of the inferior and inferoseptal   myocardium. Doppler parameters are consistent with abnormal left   ventricular relaxation (grade 1 diastolic dysfunction). Doppler   parameters are consistent with indeterminate ventricular filling   pressure. - Aortic valve: Transvalvular velocity was within the normal range.   There was no stenosis. There was mild regurgitation. - Aorta: Ascending aortic diameter: 37 mm (S). - Ascending aorta: The ascending aorta was mildly dilated. - Mitral valve: Transvalvular velocity was within the normal range.   There was no evidence for stenosis. There was mild regurgitation. - Right ventricle: The cavity size was normal. Wall thickness was   normal. Systolic function was normal. - Atrial septum: No defect or patent foramen ovale was identified   by color flow Doppler or saline microcavitation study. - Tricuspid valve: There was moderate regurgitation. - Pulmonary arteries: Systolic pressure was within the normal   range. PA peak pressure: 27 mm Hg (S).  [place] 2018  The left ventricular  systolic function is normal.  LV end diastolic pressure is mildly elevated.  The left ventricular ejection fraction is 55-65% by visual estimate.  Ost LAD to Mid LAD lesion, 100 %stenosed.  Ost Cx lesion, 20 %stenosed.  LM lesion, 20 %stenosed.  Prox RCA lesion, 20 %stenosed.  SVG and is normal in caliber and anatomically normal.  LIMA and is normal in caliber and anatomically normal.  ASSESSMENT AND PLAN:  1.  Paroxysmal atrial fibrillation: Currently on Eliquis and diltiazem as needed.  Also on daily metoprolol.  She does have coronary artery disease but a normal ejection fraction.  She would not tolerate flecainide or pathilon.  She is having 0.4% atrial fibrillation noted on cardiac monitor.  Due to that and her symptoms, Huan Pollok plan to start [facility] today.  This patients CHA2DS2-VASc Score and unadjusted Ischemic Stroke Rate (% per year) is equal to 9.7 % stroke rate/year from a score of 6  Above score calculated as 1 point each if present [CHF, HTN, DM, Vascular=MI/PAD/Aortic Plaque, Age if 65-74, or Female] Above score calculated as 2 points each if present [Age > 75, or Stroke/TIA/TE]  .  Coronary artery disease status post CABG: No current chest pain.  3.  Hyperlipidemia: Continue Crestor per primary cardiology  Case discussed with referring cardiology  Current medicines are reviewed at length with the patient today.   The patient does not have concerns regarding her medicines.  The following changes were made today:  none  Labs/ tests ordered today include:  Orders Placed This Encounter  Procedures   EKG 12-Lead     Disposition:   FU with Madaleine Simmon 3 months  Signed, Liz Pinho Meredith Leeds, MD  02/22/2019 11:11 AM     [facility] [address] [place] [address] [phone] (office) [phone] (fax)

## 2019-02-22 NOTE — Addendum Note (Signed)
Addended by: Stanton Kidney on: 02/22/2019 11:21 AM   Modules accepted: Orders

## 2019-02-28 ENCOUNTER — Telehealth: Payer: Self-pay

## 2019-02-28 ENCOUNTER — Other Ambulatory Visit: Payer: Self-pay | Admitting: Interventional Cardiology

## 2019-02-28 MED ORDER — DRONEDARONE HCL 400 MG PO TABS: 400.0000 mg | ORAL_TABLET | Freq: Two times a day (BID) | ORAL | 3 refills | Status: DC

## 2019-02-28 NOTE — Telephone Encounter (Signed)
**Note De-Identified Navayah Sok Obfuscation** A completed [employer] pt asst application for Multaq was left at the office for this pt. We have completed the provider part of the application, Dr Tamala Julian (DOD) has signed it as well as a Merchant [employer] officer for [employer], we have faxed all to [employer] Pt Asst program.

## 2019-03-02 ENCOUNTER — Telehealth: Payer: Self-pay | Admitting: Cardiology

## 2019-03-02 NOTE — Telephone Encounter (Signed)
Would hold multaq if she feels that this is causing her symptoms. Her other options are amiodarone or admission for tikosyn loading. We can discuss with her next week.

## 2019-03-02 NOTE — Telephone Encounter (Signed)
Pt called to report that she has been having stomach pains, nausea, and increased SOB....she does not think she can tolerate the Multaq...she has been on it for 1 week.. and symptoms started after the first dose.   Pt is asking if she can d/c it and what Dr.Camnitz recommends.   Pt says she has an appt at 3pm today and will not be home until after 5pm. Her son has her cell so we cannot call it.[name] She will try to call us back before 5 pm.

## 2019-03-02 NOTE — Telephone Encounter (Signed)
° ° °  Pt c/o Shortness Of Breath: STAT if SOB developed within the last 24 hours or pt is noticeably SOB on the phone  1. Are you currently SOB (can you hear that pt is SOB on the phone)? no  2. How long have you been experiencing SOB? 1 week  3. Are you SOB when sitting or when up moving around? Moving around  4. Are you currently experiencing any other symptoms? Nausea, stomach discomfort. Should patient continue dronedarone (MULTAQ) 400 MG tablet

## 2019-03-02 NOTE — Telephone Encounter (Signed)
Pt advised Dr.Camnitz recommendation to hold the Multaq and will call us next week and if she is feeling better we can talk to her about other antiarrythmic options.

## 2019-03-05 ENCOUNTER — Other Ambulatory Visit: Payer: Self-pay | Admitting: Physician Assistant

## 2019-03-05 ENCOUNTER — Ambulatory Visit (INDEPENDENT_AMBULATORY_CARE_PROVIDER_SITE_OTHER): Payer: Medicare HMO | Admitting: *Deleted

## 2019-03-05 DIAGNOSIS — G459 Transient cerebral ischemic attack, unspecified: Secondary | ICD-10-CM | POA: Diagnosis not present

## 2019-03-06 LAB — CUP PACEART REMOTE DEVICE CHECK
Date Time Interrogation Session: 20201019221110
Implantable Pulse Generator Implant Date: 20200129

## 2019-03-07 NOTE — Telephone Encounter (Signed)
Follow up  Patient states that the medication prescribed did not work and that she is feeling better.

## 2019-03-09 NOTE — Telephone Encounter (Signed)
Attempted to reach pt, no answer, no voice mail

## 2019-03-12 NOTE — Telephone Encounter (Signed)
Follow up ° ° °Patient is returning your call. Please call. ° ° ° °

## 2019-03-12 NOTE — Telephone Encounter (Signed)
Pt states she is feeling so much better off the Multaq, but she is continuing to experience palpitations/PAF. Aware I will get advisement from [facility] and call her back this week w/ his recommendation. Patient verbalized understanding and agreeable to plan.

## 2019-03-14 NOTE — Telephone Encounter (Signed)
No answer, no voicemail. Attempted to call pt to discuss possible Tikosyn start/adx per Camnitz recommendation to start Tikosyn

## 2019-03-15 ENCOUNTER — Encounter (HOSPITAL_COMMUNITY)
Admission: RE | Admit: 2019-03-15 | Discharge: 2019-03-15 | Disposition: A | Discharge status: Home / Self Care | Payer: Medicare HMO | Source: Ambulatory Visit | Attending: Physician Assistant | Admitting: Physician Assistant

## 2019-03-26 NOTE — Progress Notes (Signed)
Carelink Summary Report / Loop Recorder 

## 2019-03-27 NOTE — Telephone Encounter (Signed)
Attempted to reach pt again, no answer, no voicemail. Will attempt again before sending a letter

## 2019-03-28 ENCOUNTER — Encounter (HOSPITAL_COMMUNITY): Payer: Self-pay

## 2019-03-28 ENCOUNTER — Other Ambulatory Visit: Payer: Self-pay

## 2019-03-28 ENCOUNTER — Encounter (HOSPITAL_COMMUNITY)
Admission: RE | Admit: 2019-03-28 | Discharge: 2019-03-28 | Disposition: A | Discharge status: Home / Self Care | Payer: Medicare HMO | Source: Ambulatory Visit | Attending: Physician Assistant | Admitting: Physician Assistant

## 2019-03-28 DIAGNOSIS — M810 Age-related osteoporosis without current pathological fracture: Secondary | ICD-10-CM | POA: Insufficient documentation

## 2019-03-28 LAB — BASIC METABOLIC PANEL
Anion gap: 8 (ref 5–15)
BUN: 12 mg/dL (ref 8–23)
CO2: 25 mmol/L (ref 22–32)
Calcium: 8.9 mg/dL (ref 8.9–10.3)
Chloride: 105 mmol/L (ref 98–111)
Creatinine, Ser: 0.57 mg/dL (ref 0.44–1.00)
GFR calc Af Amer: >60 mL/min (ref >60)
GFR calc non Af Amer: >60 mL/min (ref >60)
Glucose, Bld: 94 mg/dL (ref 70–99)
Potassium: 4.4 mmol/L (ref 3.5–5.1)
Sodium: 138 mmol/L (ref 135–145)

## 2019-03-28 MED ORDER — SODIUM CHLORIDE 0.9 % IV SOLN
Freq: Once | INTRAVENOUS | Status: AC
  Administered 2019-03-28: 12:00:00 via INTRAVENOUS

## 2019-03-28 MED ORDER — ZOLEDRONIC ACID 5 MG/100ML IV SOLN
5.0000 mg | Freq: Once | INTRAVENOUS | Status: AC
  Administered 2019-03-28: 5 mg via INTRAVENOUS

## 2019-04-04 ENCOUNTER — Encounter: Payer: Self-pay | Admitting: Physician Assistant

## 2019-04-04 ENCOUNTER — Ambulatory Visit (INDEPENDENT_AMBULATORY_CARE_PROVIDER_SITE_OTHER): Payer: Medicare HMO | Admitting: Physician Assistant

## 2019-04-04 DIAGNOSIS — M5412 Radiculopathy, cervical region: Secondary | ICD-10-CM | POA: Diagnosis not present

## 2019-04-04 DIAGNOSIS — S22080D Wedge compression fracture of T11-T12 vertebra, subsequent encounter for fracture with routine healing: Secondary | ICD-10-CM

## 2019-04-04 MED ORDER — HYDROCODONE-ACETAMINOPHEN 5-325 MG PO TABS: 1.0000 | ORAL_TABLET | Freq: Four times a day (QID) | ORAL | 0 refills | Status: DC | PRN

## 2019-04-04 NOTE — Progress Notes (Signed)
Telephone visit  Subjective: Evelyn [name]:OACZYSA pain medication PCP: Remus Loffler, PA-C YTK:[id number] Evelyn [name] is a 78 y.o. female calls for telephone consult today. Patient provides verbal consent for consult held via phone.  Patient is identified with 2 separate identifiers.  At this time the entire area is on COVID-19 social distancing and stay home orders are in place.  Patient is of higher risk and therefore we are performing this by a virtual method.  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 Multiple thoracic wedge fractures  C5-7 ACDF without adverse features. anterior compression deformity at T11 with an associated Schmorl's node involving the superior end plate at this level.  At T8-9, a left paracentral focal disc protrusion.  At T5-6, there is a central broad-based annular disc bulge.  A moderate sized hemangioma at T9.  This patient returns for a 3 month recheck on narcotic use for the above named conditions  Current medications- Hydrocodone5/325 1 every 6 hours as needed for severe pain Medication side effects-onne Any concerns-none  Pain on scale of 1-10-7 Frequency-Daily What increases pain-bending and lifting What makes pain Better-nothing mild Effects on ADL -mild Any change in general medical condition-no  Effectiveness of current meds-good Adverse reactions form pain meds-no PMP AWARE website reviewed:Yes Any suspicious activity on PMP Aware:No MME daily dose:20  Contract on file11/11/2017 Last UDS5/29/2020    ROS: Per HPI  Allergies  Allergen Reactions   Sulfa Antibiotics Swelling   Past Medical History:  Diagnosis Date   Aneurysm (HCC)    Aortic insufficiency    CAD (coronary artery disease)    CABG LIMA to LAD,vein graft to diagonal,vein graft to OM   Dyslipidemia    GERD  (gastroesophageal reflux disease)    Hypothyroid     Current Outpatient Medications:    ALPRAZolam (XANAX) 0.25 MG tablet, Take 1 tablet (0.25 mg total) by mouth 3 (three) times daily as needed for anxiety., Disp: 30 tablet, Rfl: 0   apixaban (ELIQUIS) 2.5 MG TABS tablet, Take 1 tablet (2.5 mg total) by mouth 2 (two) times daily., Disp: 60 tablet, Rfl: 6   diltiazem (CARDIZEM) 30 MG tablet, Take 1 tablet (30 mg total) by mouth every 6 (six) hours as needed (for rapid heart rate)., Disp: 30 tablet, Rfl: 2   dronedarone (MULTAQ) 400 MG tablet, Take 1 tablet (400 mg total) by mouth 2 (two) times daily with a meal., Disp: 180 tablet, Rfl: 3   furosemide (LASIX) 20 MG tablet, TAKE 1 TABLET BY MOUTH EVERY DAY, Disp: 90 tablet, Rfl: 0   HYDROcodone-acetaminophen (NORCO) 5-325 MG tablet, Take 1 tablet by mouth every 6 (six) hours as needed for moderate pain., Disp: 120 tablet, Rfl: 0   HYDROcodone-acetaminophen (NORCO) 5-325 MG tablet, Take 1 tablet by mouth every 6 (six) hours as needed for moderate pain., Disp: 120 tablet, Rfl: 0   HYDROcodone-acetaminophen (NORCO) 5-325 MG tablet, Take 1 tablet by mouth every 6 (six) hours as needed for moderate pain., Disp: 120 tablet, Rfl: 0   hydrocortisone 2.5 % cream, APPLY TO AFFECTED AREA TWICE A DAY, Disp: 28.35 g, Rfl: 5   isosorbide mononitrate (IMDUR) 60 MG 24 hr tablet, TAKE 1 TABLET (60 MG TOTAL) DAILY BY MOUTH., Disp: 90 tablet, Rfl: 0   levothyroxine (SYNTHROID) 88 MCG tablet, Take 1 tablet (88 mcg total) by mouth daily before breakfast., Disp: 30 tablet, Rfl: 6  loratadine (CLARITIN) 10 MG tablet, Take 1 tablet (10 mg total) by mouth daily., Disp: 90 tablet, Rfl: 3   metoprolol tartrate (LOPRESSOR) 100 MG tablet, TAKE 1 TABLET BY MOUTH TWICE A DAY, Disp: 180 tablet, Rfl: 3   nitroGLYCERIN (NITROSTAT) 0.4 MG SL tablet, PLACE 1 TABLET (0.4 MG TOTAL) UNDER THE TONGUE EVERY 5 (FIVE) MINUTES AS NEEDED FOR CHEST PAIN., Disp: 25 tablet, Rfl:  0   rosuvastatin (CRESTOR) 10 MG tablet, Take 1 tablet (10 mg total) by mouth every evening., Disp: 90 tablet, Rfl: 1   rosuvastatin (CRESTOR) 20 MG tablet, Take 1 tablet (20 mg total) by mouth every evening., Disp: 90 tablet, Rfl: 1   Zoledronic Acid (RECLAST IV), Inject into the vein. Once yearly, Disp: , Rfl:   Assessment/ Plan: 78 y.o. female   1. Closed wedge compression fracture of T11 vertebra with routine healing, subsequent encounter - HYDROcodone-acetaminophen (NORCO) 5-325 MG tablet; Take 1 tablet by mouth every 6 (six) hours as needed for moderate pain.  Dispense: 120 tablet; Refill: 0  2. Cervical neuropathic pain - HYDROcodone-acetaminophen (NORCO) 5-325 MG tablet; Take 1 tablet by mouth every 6 (six) hours as needed for moderate pain.  Dispense: 120 tablet; Refill: 0   No follow-ups on file.  Continue all other maintenance medications as listed above.  Start time: 3:44 PM End time: 3:52 PM  Meds ordered this encounter  Medications   HYDROcodone-acetaminophen (NORCO) 5-325 MG tablet    Sig: Take 1 tablet by mouth every 6 (six) hours as needed for moderate pain.    Dispense:  120 tablet    Refill:  0    Fill 04/04/19    Order Specific Question:   Supervising Provider    Answer:   [name] [[id number]]   HYDROcodone-acetaminophen ([place]) 5-325 MG tablet    Sig: Take 1 tablet by mouth every 6 (six) hours as needed for moderate pain.    Dispense:  120 tablet    Refill:  0    Fill 05/13/19    Order Specific Question:   Supervising Provider    Answer:   [name] [[id number]]   HYDROcodone-acetaminophen ([place]) 5-325 MG tablet    Sig: Take 1 tablet by mouth every 6 (six) hours as needed for moderate pain.    Dispense:  120 tablet    Refill:  0    Fill 06/01/2018    Order Specific Question:   Supervising Provider    Answer:   [name] [[id number]]    Particia Nearing PA-C [place] [phone]

## 2019-04-05 NOTE — Telephone Encounter (Signed)
Attempted to reach pt again, no answer, no voicemail, hangs up after so many rings. Will send pt letter asking her to call the office to discuss.

## 2019-04-08 LAB — CUP PACEART REMOTE DEVICE CHECK
Date Time Interrogation Session: 20201121171025
Implantable Pulse Generator Implant Date: 20200129

## 2019-04-09 ENCOUNTER — Ambulatory Visit (INDEPENDENT_AMBULATORY_CARE_PROVIDER_SITE_OTHER): Payer: Medicare HMO | Admitting: *Deleted

## 2019-04-09 DIAGNOSIS — G459 Transient cerebral ischemic attack, unspecified: Secondary | ICD-10-CM

## 2019-04-09 NOTE — Telephone Encounter (Signed)
Pt sent MyChart message asking her to call the office to discuss, after several failed attempts to reach her via telephone.

## 2019-04-18 ENCOUNTER — Ambulatory Visit (HOSPITAL_COMMUNITY)
Admission: RE | Admit: 2019-04-18 | Discharge: 2019-04-18 | Disposition: A | Discharge status: Home / Self Care | Payer: Medicare HMO | Source: Ambulatory Visit | Attending: "Endocrinology | Admitting: "Endocrinology

## 2019-04-18 ENCOUNTER — Other Ambulatory Visit: Payer: Self-pay

## 2019-04-18 DIAGNOSIS — E039 Hypothyroidism, unspecified: Secondary | ICD-10-CM | POA: Diagnosis not present

## 2019-04-18 DIAGNOSIS — E042 Nontoxic multinodular goiter: Secondary | ICD-10-CM | POA: Diagnosis not present

## 2019-04-25 ENCOUNTER — Ambulatory Visit: Payer: Medicare HMO | Admitting: "Endocrinology

## 2019-04-27 ENCOUNTER — Other Ambulatory Visit: Payer: Self-pay

## 2019-04-27 ENCOUNTER — Other Ambulatory Visit: Payer: Self-pay | Admitting: "Endocrinology

## 2019-04-27 ENCOUNTER — Other Ambulatory Visit: Payer: Medicare HMO

## 2019-04-27 DIAGNOSIS — E039 Hypothyroidism, unspecified: Secondary | ICD-10-CM

## 2019-04-28 LAB — T4, FREE: Free T4: 1.84 ng/dL — ABNORMAL HIGH (ref 0.82–1.77)

## 2019-04-28 LAB — TSH: TSH: 0.04 u[IU]/mL — ABNORMAL LOW (ref 0.450–4.500)

## 2019-05-05 ENCOUNTER — Other Ambulatory Visit: Payer: Self-pay | Admitting: Physician Assistant

## 2019-05-05 DIAGNOSIS — E782 Mixed hyperlipidemia: Secondary | ICD-10-CM

## 2019-05-05 DIAGNOSIS — G459 Transient cerebral ischemic attack, unspecified: Secondary | ICD-10-CM

## 2019-05-07 ENCOUNTER — Ambulatory Visit (INDEPENDENT_AMBULATORY_CARE_PROVIDER_SITE_OTHER): Payer: Medicare HMO | Admitting: "Endocrinology

## 2019-05-07 ENCOUNTER — Encounter: Payer: Self-pay | Admitting: "Endocrinology

## 2019-05-07 DIAGNOSIS — E039 Hypothyroidism, unspecified: Secondary | ICD-10-CM | POA: Diagnosis not present

## 2019-05-07 DIAGNOSIS — E042 Nontoxic multinodular goiter: Secondary | ICD-10-CM

## 2019-05-07 MED ORDER — LEVOTHYROXINE SODIUM 75 MCG PO TABS: 75.0000 ug | ORAL_TABLET | Freq: Every day | ORAL | 6 refills | Status: DC

## 2019-05-07 NOTE — Progress Notes (Signed)
05/07/2019                                Endocrinology Telehealth Visit Follow up Note -During COVID -19 Pandemic  I connected with Evelyn [name] on 05/07/2019   by telephone and verified that I am speaking with the correct person using two identifiers. Evelyn [name], 1940-12-07. she has verbally consented to this visit. All issues noted in this document were discussed and addressed. The format was not optimal for physical exam.   Subjective:    Patient ID: Evelyn [name], female    DOB: 1941-02-12, PCP Terald Sleeper, PA-C   Past Medical History:  Diagnosis Date  . Aneurysm ([place])   . Aortic insufficiency   . CAD (coronary artery disease)    CABG LIMA to LAD,vein graft to diagonal,vein graft to OM  . Dyslipidemia   . GERD (gastroesophageal reflux disease)   . Hypothyroid    Past Surgical History:  Procedure Laterality Date  . ABDOMINAL HYSTERECTOMY    . BACK SURGERY  2009  . CORONARY ANGIOPLASTY  2003   cutting balloon atherectomy in-stent stenosis of the LAD.  [name] CORONARY ANGIOPLASTY WITH STENT PLACEMENT  2002   remote PCI & stenting LAD,  . CORONARY ARTERY BYPASS GRAFT  2004   LIMA to LAD,vein graft to diagonal,vein graft to OM  . FOOT SURGERY    . LEFT HEART CATH AND CORS/GRAFTS ANGIOGRAPHY N/A 02/23/2017   Procedure: LEFT HEART CATH AND CORS/GRAFTS ANGIOGRAPHY;  Surgeon: Wellington Hampshire, MD;  Location: [facility];  Service: Cardiovascular;  Laterality: N/A;  . LEFT HEART CATHETERIZATION WITH CORONARY ANGIOGRAM N/A 12/15/2011   Procedure: LEFT HEART CATHETERIZATION WITH CORONARY ANGIOGRAM;  Surgeon: Sanda Klein, MD;  Location: [facility];  Service: Cardiovascular;  Laterality: N/A;  . LOOP RECORDER INSERTION N/A 06/14/2018   Procedure: LOOP RECORDER INSERTION;  Surgeon: Sanda Klein, MD;  Location: [facility];  Service: Cardiovascular;  Laterality: N/A;  . NECK SURGERY    . NM MYOCAR PERF WALL MOTION  12/01/2011   normal study-persistent  exercise induced ECG changes raises the concern for ischemia (balanced ischemia).  . NOSE SURGERY    . TONSILLECTOMY  1948  . TUBAL LIGATION     Social History   Socioeconomic History  . Marital status: Widowed    Spouse name: Not on file  . Number of children: 2  . Years of education: Associates  . Highest education level: Associate degree: academic program  Occupational History  . Occupation: Retired  Tobacco Use  . Smoking status: Never Smoker  . Smokeless tobacco: Never Used  Substance and Sexual Activity  . Alcohol use: No  . Drug use: No  . Sexual activity: Not Currently  Other Topics Concern  . Not on file  Social History Narrative   Mother of 2 with 4 grandchildren   Right-handed   Caffeine: 8-10 cups coffee   Social Determinants of Health   Financial Resource Strain:   . Difficulty of Paying Living Expenses: Not on file  Food Insecurity:   . Worried About Charity fundraiser in the Last Year: Not on file  . Ran Out of Food in the Last Year: Not on file  Transportation Needs:   . Lack of Transportation (Medical): Not on file  . Lack of Transportation (Non-Medical): Not on file  Physical Activity:   . Days of Exercise  per Week: Not on file  . Minutes of Exercise per Session: Not on file  Stress: Stress Concern Present  . Feeling of Stress : To some extent  Social Connections: Unknown  . Frequency of Communication with Friends and Family: Not on file  . Frequency of Social Gatherings with Friends and Family: Once a week  . Attends Religious Services: Not on file  . Active Member of Clubs or Organizations: Not on file  . Attends BankerClub or Organization Meetings: 1 to 4 times per year  . Marital Status: Not on file   Outpatient Encounter Medications as of 05/07/2019  Medication Sig  . ALPRAZolam (XANAX) 0.25 MG tablet Take 1 tablet (0.25 mg total) by mouth 3 (three) times daily as needed for anxiety.  [name]. apixaban (ELIQUIS) 2.5 MG TABS tablet Take 1 tablet (2.5  mg total) by mouth 2 (two) times daily.  [name]. diltiazem (CARDIZEM) 30 MG tablet Take 1 tablet (30 mg total) by mouth every 6 (six) hours as needed (for rapid heart rate).  [name]. dronedarone (MULTAQ) 400 MG tablet Take 1 tablet (400 mg total) by mouth 2 (two) times daily with a meal.  . furosemide (LASIX) 20 MG tablet TAKE 1 TABLET BY MOUTH EVERY DAY  . HYDROcodone-acetaminophen (NORCO) 5-325 MG tablet Take 1 tablet by mouth every 6 (six) hours as needed for moderate pain.  [name]. HYDROcodone-acetaminophen (NORCO) 5-325 MG tablet Take 1 tablet by mouth every 6 (six) hours as needed for moderate pain.  [name]. HYDROcodone-acetaminophen (NORCO) 5-325 MG tablet Take 1 tablet by mouth every 6 (six) hours as needed for moderate pain.  . hydrocortisone 2.5 % cream APPLY TO AFFECTED AREA TWICE A DAY  . isosorbide mononitrate (IMDUR) 60 MG 24 hr tablet TAKE 1 TABLET (60 MG TOTAL) DAILY BY MOUTH.  [name]. levothyroxine (SYNTHROID) 75 MCG tablet Take 1 tablet (75 mcg total) by mouth daily before breakfast.  . loratadine (CLARITIN) 10 MG tablet Take 1 tablet (10 mg total) by mouth daily.  . metoprolol tartrate (LOPRESSOR) 100 MG tablet TAKE 1 TABLET BY MOUTH TWICE A DAY  . nitroGLYCERIN (NITROSTAT) 0.4 MG SL tablet PLACE 1 TABLET (0.4 MG TOTAL) UNDER THE TONGUE EVERY 5 (FIVE) MINUTES AS NEEDED FOR CHEST PAIN.  . rosuvastatin (CRESTOR) 10 MG tablet TAKE 1 TABLET BY MOUTH EVERY DAY IN THE EVENING  . rosuvastatin (CRESTOR) 20 MG tablet TAKE 1 TABLET BY MOUTH EVERY DAY IN THE EVENING  . Zoledronic Acid (RECLAST IV) Inject into the vein. Once yearly  . [DISCONTINUED] levothyroxine (SYNTHROID) 88 MCG tablet Take 1 tablet (88 mcg total) by mouth daily before breakfast.   No facility-administered encounter medications on file as of 05/07/2019.   ALLERGIES: Allergies  Allergen Reactions  . Sulfa Antibiotics Swelling   VACCINATION STATUS: Immunization History  Administered Date(s) Administered  . Influenza, High Dose Seasonal PF  03/09/2016, 03/02/2017  . Pneumococcal Conjugate-13 01/11/2018    HPI  78 year old female with medical history as above. She is being engaged in telehealth via telephone for follow-up for hypothyroidism and multinodular goiter. She is on levothyroxine 88 mcg p.o. daily before breakfast.     - She is status post FNA 3 nodules in her thyroid benign findings.  Her previsit surveillance thyroid/neck ultrasound shows stable findings with no concerning nodules at this time.    -She was recently diagnosed with acute coronary syndrome on medical management. -She is compliant to her medications.  She has no new complaints today.  -Her previsit labs show evidence  of under replacement.  - She denies family history of thyroid dysfunction. She feels occasional choking sensation in her neck, no recent voice change nor shortness of breath. She denies any exposure to neck radiation. - She denies any history of smoking.  Review of Systems Limited as above.  Objective:    There were no vitals taken for this visit.  Wt Readings from Last 3 Encounters:  03/28/19 (P) 113 lb (51.3 kg)  02/22/19 115 lb (52.2 kg)  01/29/19 116 lb (52.6 kg)    Physical Exam   CMP     Component Value Date/Time   NA 138 03/28/2019 1104   NA 143 10/13/2018 0907   K 4.4 03/28/2019 1104   CL 105 03/28/2019 1104   CO2 25 03/28/2019 1104   GLUCOSE 94 03/28/2019 1104   BUN 12 03/28/2019 1104   BUN 11 10/13/2018 0907   CREATININE 0.57 03/28/2019 1104   CREATININE 0.59 07/11/2014 0913   CALCIUM 8.9 03/28/2019 1104   PROT 6.5 10/13/2018 0907   ALBUMIN 4.2 10/13/2018 0907   AST 15 10/13/2018 0907   ALT 12 10/13/2018 0907   ALKPHOS 53 10/13/2018 0907   BILITOT 0.4 10/13/2018 0907   GFRNONAA >60 03/28/2019 1104   GFRAA >60 03/28/2019 1104   Recent Results (from the past 2160 hour(s))  CUP PACEART REMOTE DEVICE CHECK     Status: None   Collection Time: 02/22/19 12:34 PM  Result Value Ref Range   Pulse Generator  Manufacturer MERM    Date Time Interrogation Session [id number]    Pulse Gen Model [id number] Reveal LINQ    Pulse Gen Serial Number [id number]    Clinic Name [facility]    Implantable Pulse Generator Type ICM/ILR    Implantable Pulse Generator Implant Date [id number]    Eval Rhythm SR at 60 bpm   CUP PACEART REMOTE DEVICE CHECK     Status: None   Collection Time: 03/05/19 10:11 PM  Result Value Ref Range   Date Time Interrogation Session [id number]    Pulse Generator Manufacturer MERM    Pulse Gen Model [id number] Reveal LINQ    Pulse Gen Serial Number [id number]    Clinic Name [facility]    Implantable Pulse Generator Type ICM/ILR    Implantable Pulse Generator Implant Date [id number]   Basic metabolic panel     Status: None   Collection Time: 03/28/19 11:04 AM  Result Value Ref Range   Sodium 138 135 - 145 mmol/L   Potassium 4.4 3.5 - 5.1 mmol/L   Chloride 105 98 - 111 mmol/L   CO2 25 22 - 32 mmol/L   Glucose, Bld 94 70 - 99 mg/dL   BUN 12 8 - 23 mg/dL   Creatinine, Ser 2.53 0.44 - 1.00 mg/dL   Calcium 8.9 8.9 - 66.4 mg/dL   GFR calc non Af Amer >60 >60 mL/min   GFR calc Af Amer >60 >60 mL/min   Anion gap 8 5 - 15    Comment: Performed at [facility], [address] [place], [place] [address]  CUP PACEART REMOTE DEVICE CHECK     Status: None   Collection Time: 04/07/19  5:10 PM  Result Value Ref Range   Date Time Interrogation Session [id number]    Pulse Generator Manufacturer MERM    Pulse Gen Model [id number] Reveal LINQ    Pulse Gen Serial Number [id number]    Clinic Name [facility]    Implantable Pulse Generator Type ICM/ILR  Implantable Pulse Generator Implant Date [id number]   T4, Free     Status: Abnormal   Collection Time: 04/27/19 11:00 AM  Result Value Ref Range   Free T4 1.84 (H) 0.82 - 1.77 ng/dL  TSH     Status: Abnormal   Collection Time: 04/27/19 11:00 AM  Result Value Ref Range   TSH 0.040 (L) 0.450 - 4.500 uIU/mL   Fine-needle aspiration  samples  of 3 nodules in her thyroid is benign  Repeat thyroid ultrasound on April 18, 2019: Right lobe 4 cm x 1.9 mm x 2.6 cm, left lobe 4.1 cm x 1.5 cm x 2.3 cm  There are small stable nodules 3 on the right and one on the left.  2 right-sided nodules and one nodule on the left were previously biopsied with benign findings.   Assessment & Plan:   1.  hypothyroidism 2. Multinodular goiter  -Her previsit thyroid function tests are content with over replacement.  I discussed and lowered her levothyroxine to 75 mcg p.o. daily before breakfast.    - We discussed about the correct intake of her thyroid hormone, on empty stomach at fasting, with water, separated by at least 30 minutes from breakfast and other medications,  and separated by more than 4 hours from calcium, iron, multivitamins, acid reflux medications (PPIs). -Patient is made aware of the fact that thyroid hormone replacement is needed for life, dose to be adjusted by periodic monitoring of thyroid function tests.   -  Prior Biopsy off  3 nodules is negative for malignancy.  She had stable findings on her surveillance thyroid ultrasound.  She will not need intervention at this time.    - I advised patient to maintain close follow up with Remus Loffler, PA-C for primary care needs.  Time for this visit: 15 minutes. [name]  participated in the discussions, expressed understanding, and voiced agreement with the above plans.  All questions were answered to her satisfaction. she is encouraged to contact clinic should she have any questions or concerns prior to her return visit.   Follow up plan: Return in about 6 months (around 11/05/2019) for Follow up with Pre-visit Labs.  Marquis Lunch, MD Phone: [phone]  Fax: [phone]  -  This note was partially dictated with voice recognition software. Similar sounding words can be transcribed inadequately or may not  be corrected upon review.  05/07/2019, 1:05 PM

## 2019-05-08 ENCOUNTER — Telehealth: Payer: Self-pay | Admitting: Physician Assistant

## 2019-05-08 ENCOUNTER — Other Ambulatory Visit: Payer: Self-pay | Admitting: Physician Assistant

## 2019-05-08 MED ORDER — PREDNISONE 10 MG (21) PO TBPK: ORAL_TABLET | ORAL | 0 refills | Status: DC

## 2019-05-08 NOTE — Telephone Encounter (Signed)
Yes I have sent a prescription for Sterapred 6-day Dosepak

## 2019-05-08 NOTE — Telephone Encounter (Signed)
Pt aware and voiced understanding 

## 2019-05-08 NOTE — Telephone Encounter (Signed)
Patient called requesting that Evelyn [name] send her in a prescription for Prednisone without her having to make an appt. Patient says she has done it for her in the past whenever she has shoulder pain.

## 2019-05-10 ENCOUNTER — Ambulatory Visit (INDEPENDENT_AMBULATORY_CARE_PROVIDER_SITE_OTHER): Payer: Medicare HMO | Admitting: *Deleted

## 2019-05-10 DIAGNOSIS — G459 Transient cerebral ischemic attack, unspecified: Secondary | ICD-10-CM

## 2019-05-12 LAB — CUP PACEART REMOTE DEVICE CHECK
Date Time Interrogation Session: 20201224170922
Implantable Pulse Generator Implant Date: 20200129

## 2019-05-14 ENCOUNTER — Telehealth: Payer: Self-pay | Admitting: Emergency Medicine

## 2019-05-14 NOTE — Telephone Encounter (Signed)
Patient had episode of AF with RVR on 05/10/19 that lasted 17 minutes. Patient took a prn dose of cardizem and palpitations stopped. Patient reports she feels the best she has in weeks. Ed precautions given and will call if her condition changes.

## 2019-05-28 ENCOUNTER — Other Ambulatory Visit: Payer: Self-pay

## 2019-05-28 ENCOUNTER — Ambulatory Visit: Payer: Medicare HMO | Admitting: Cardiology

## 2019-05-28 ENCOUNTER — Encounter: Payer: Self-pay | Admitting: Cardiology

## 2019-05-28 VITALS — BP 104/62 | HR 59 | Ht 62.0 in | Wt 111.6 lb

## 2019-05-28 DIAGNOSIS — I480 Paroxysmal atrial fibrillation: Secondary | ICD-10-CM | POA: Diagnosis not present

## 2019-05-28 NOTE — Progress Notes (Signed)
Electrophysiology Office Note   Date:  05/28/2019   ID:  Evelyn [name], DOB [birth year], MRN [id number]  PCP:  Terald Sleeper, PA-C  Cardiologist: [facility] Primary Electrophysiologist:  Camauri Fleece Meredith Leeds, MD    Chief Complaint: atrial fibrillation   History of Present Illness: Evelyn [name] is a 79 y.o. female who is being seen today for the evaluation of atrial fibrillation at the request of Terald Sleeper, PA-C. Presenting today for electrophysiology evaluation.  He has a history of atrial fibrillation/flutter, coronary artery disease, TIA hypertension, hyperlipidemia, aortic insufficiency, and ascending aortic aneurysm.  She has been having episodes of atrial fibrillation.  Her symptoms are palpitations, weakness, and fatigue.  Linq monitor shows an overall low burden.  She is currently on Multaq due to symptoms from her atrial fibrillation.  Today, denies symptoms of palpitations, chest pain, shortness of breath, orthopnea, PND, lower extremity edema, claudication, dizziness, presyncope, syncope, bleeding, or neurologic sequela. The patient is tolerating medications without difficulties.  She is currently doing well.  Cardiac monitor shows minimal episodes of atrial fibrillation.  She is able to do all of her daily activities.  She was put on Multaq at her prior appointment, but unfortunately had symptoms of shortness of breath.   Past Medical History:  Diagnosis Date  . Aneurysm ([place])   . Aortic insufficiency   . CAD (coronary artery disease)    CABG LIMA to LAD,vein graft to diagonal,vein graft to OM  . Dyslipidemia   . GERD (gastroesophageal reflux disease)   . Hypothyroid    Past Surgical History:  Procedure Laterality Date  . ABDOMINAL HYSTERECTOMY    . BACK SURGERY  2009  . CORONARY ANGIOPLASTY  2003   cutting balloon atherectomy in-stent stenosis of the LAD.  [name] CORONARY ANGIOPLASTY WITH STENT PLACEMENT  2002   remote PCI & stenting LAD,  . CORONARY ARTERY  BYPASS GRAFT  2004   LIMA to LAD,vein graft to diagonal,vein graft to OM  . FOOT SURGERY    . LEFT HEART CATH AND CORS/GRAFTS ANGIOGRAPHY N/A 02/23/2017   Procedure: LEFT HEART CATH AND CORS/GRAFTS ANGIOGRAPHY;  Surgeon: Wellington Hampshire, MD;  Location: [facility];  Service: Cardiovascular;  Laterality: N/A;  . LEFT HEART CATHETERIZATION WITH CORONARY ANGIOGRAM N/A 12/15/2011   Procedure: LEFT HEART CATHETERIZATION WITH CORONARY ANGIOGRAM;  Surgeon: Sanda Klein, MD;  Location: [facility];  Service: Cardiovascular;  Laterality: N/A;  . LOOP RECORDER INSERTION N/A 06/14/2018   Procedure: LOOP RECORDER INSERTION;  Surgeon: Sanda Klein, MD;  Location: [facility];  Service: Cardiovascular;  Laterality: N/A;  . NECK SURGERY    . NM MYOCAR PERF WALL MOTION  12/01/2011   normal study-persistent exercise induced ECG changes raises the concern for ischemia (balanced ischemia).  . NOSE SURGERY    . TONSILLECTOMY  1948  . TUBAL LIGATION       Current Outpatient Medications  Medication Sig Dispense Refill  . ALPRAZolam (XANAX) 0.25 MG tablet Take 1 tablet (0.25 mg total) by mouth 3 (three) times daily as needed for anxiety. 30 tablet 0  . apixaban (ELIQUIS) 2.5 MG TABS tablet Take 1 tablet (2.5 mg total) by mouth 2 (two) times daily. 60 tablet 6  . diltiazem (CARDIZEM) 30 MG tablet Take 1 tablet (30 mg total) by mouth every 6 (six) hours as needed (for rapid heart rate). 30 tablet 2  . furosemide (LASIX) 20 MG tablet TAKE 1 TABLET BY MOUTH EVERY DAY 90 tablet  0  . HYDROcodone-acetaminophen (NORCO) 5-325 MG tablet Take 1 tablet by mouth every 6 (six) hours as needed for moderate pain. 120 tablet 0  . hydrocortisone 2.5 % cream APPLY TO AFFECTED AREA TWICE A DAY 28.35 g 5  . isosorbide mononitrate (IMDUR) 60 MG 24 hr tablet TAKE 1 TABLET (60 MG TOTAL) DAILY BY MOUTH. 90 tablet 0  . levothyroxine (SYNTHROID) 75 MCG tablet Take 1 tablet (75 mcg total) by mouth daily before breakfast.  30 tablet 6  . loratadine (CLARITIN) 10 MG tablet Take 1 tablet (10 mg total) by mouth daily. 90 tablet 3  . metoprolol tartrate (LOPRESSOR) 100 MG tablet TAKE 1 TABLET BY MOUTH TWICE A DAY 180 tablet 3  . nitroGLYCERIN (NITROSTAT) 0.4 MG SL tablet PLACE 1 TABLET (0.4 MG TOTAL) UNDER THE TONGUE EVERY 5 (FIVE) MINUTES AS NEEDED FOR CHEST PAIN. 25 tablet 0  . rosuvastatin (CRESTOR) 10 MG tablet TAKE 1 TABLET BY MOUTH EVERY DAY IN THE EVENING 90 tablet 1  . rosuvastatin (CRESTOR) 20 MG tablet TAKE 1 TABLET BY MOUTH EVERY DAY IN THE EVENING 90 tablet 1  . Zoledronic Acid (RECLAST IV) Inject into the vein. Once yearly     No current facility-administered medications for this visit.    Allergies:   Sulfa antibiotics   Social History:  The patient  reports that she has never smoked. She has never used smokeless tobacco. She reports that she does not drink alcohol or use drugs.   Family History:  The patient's family history includes Asthma in her maternal grandmother; Cancer (age of onset: 78) in her sister; Heart attack in her mother; Heart disease in her father, mother, and sister; Heart failure in her father; Hypertension in her father; Stroke in her brother, daughter, and son; Voice disorder in her daughter.    ROS:  Please see the history of present illness.   Otherwise, review of systems is positive for none   All other systems are reviewed and negative.   PHYSICAL EXAM: VS:  BP 104/62   Pulse (!) 59   Ht 5\' 2"  (1.575 m)   Wt 111 lb 9.6 oz (50.6 kg)   SpO2 96%   BMI 20.41 kg/m  , BMI Body mass index is 20.41 kg/m. GEN: Well nourished, well developed, in no acute distress  HEENT: normal  Neck: no JVD, carotid bruits, or masses Cardiac: RRR; no murmurs, rubs, or gallops,no edema  Respiratory:  clear to auscultation bilaterally, normal work of breathing GI: soft, nontender, nondistended, + BS MS: no deformity or atrophy  Skin: warm and dry, device site well healed Neuro:  Strength  and sensation are intact Psych: euthymic mood, full affect  EKG:  EKG is ordered today. Personal review of the ekg ordered shows sinus rhythm, rate 59  Personal review of the device interrogation today. Results in [facility]   Recent Labs: 10/13/2018: ALT 12; Hemoglobin 13.3; Platelets 241 03/28/2019: BUN 12; Creatinine, Ser 0.57; Potassium 4.4; Sodium 138 04/27/2019: TSH 0.040    Lipid Panel     Component Value Date/Time   CHOL 179 10/13/2018 0907   TRIG 76 10/13/2018 0907   HDL 65 10/13/2018 0907   CHOLHDL 2.8 10/13/2018 0907   CHOLHDL 2.0 07/11/2014 0913   VLDL 12 07/11/2014 0913   LDLCALC 99 10/13/2018 0907     Wt Readings from Last 3 Encounters:  05/28/19 111 lb 9.6 oz (50.6 kg)  03/28/19 (P) 113 lb (51.3 kg)  02/22/19 115 lb (52.2 kg)  Other studies Reviewed: Additional studies/ records that were reviewed today include: TTE 2018  Review of the above records today demonstrates:  - Left ventricle: The cavity size was normal. Systolic function was   normal. The estimated ejection fraction was in the range of 55%   to 60%. Mild hypokinesis of the inferior and inferoseptal   myocardium. Doppler parameters are consistent with abnormal left   ventricular relaxation (grade 1 diastolic dysfunction). Doppler   parameters are consistent with indeterminate ventricular filling   pressure. - Aortic valve: Transvalvular velocity was within the normal range.   There was no stenosis. There was mild regurgitation. - Aorta: Ascending aortic diameter: 37 mm (S). - Ascending aorta: The ascending aorta was mildly dilated. - Mitral valve: Transvalvular velocity was within the normal range.   There was no evidence for stenosis. There was mild regurgitation. - Right ventricle: The cavity size was normal. Wall thickness was   normal. Systolic function was normal. - Atrial septum: No defect or patent foramen ovale was identified   by color flow Doppler or saline microcavitation  study. - Tricuspid valve: There was moderate regurgitation. - Pulmonary arteries: Systolic pressure was within the normal   range. PA peak pressure: 27 mm Hg (S).  LHC 2018  The left ventricular systolic function is normal.  LV end diastolic pressure is mildly elevated.  The left ventricular ejection fraction is 55-65% by visual estimate.  Ost LAD to Mid LAD lesion, 100 %stenosed.  Ost Cx lesion, 20 %stenosed.  LM lesion, 20 %stenosed.  Prox RCA lesion, 20 %stenosed.  SVG and is normal in caliber and anatomically normal.  LIMA and is normal in caliber and anatomically normal.  ASSESSMENT AND PLAN:  1.  Paroxysmal atrial fibrillation: Currently on Eliquis, metoprolol, as needed diltiazem.  Minimal atrial fibrillation on cardiac monitor.   She was having shortness of breath on Multaq.  This has since been stopped.  She remains in sinus rhythm.  CHA2DS2-VASc of 6.  2.  Coronary artery disease status post CABG: No current chest pain.  3.  Hyperlipidemia: Crestor per primary cardiology  Current medicines are reviewed at length with the patient today.   The patient does not have concerns regarding her medicines.  The following changes were made today: Stop Multaq  Labs/ tests ordered today include:  Orders Placed This Encounter  Procedures  . EKG 12-Lead     Disposition:   FU with Gillis Boardley 12 months  Signed, Statia Burdick Jorja Loa, MD  05/28/2019 2:13 PM     [facility] [address] [place] [address] [phone] (office) [phone] (fax)

## 2019-06-03 ENCOUNTER — Other Ambulatory Visit: Payer: Self-pay | Admitting: "Endocrinology

## 2019-06-11 ENCOUNTER — Ambulatory Visit (INDEPENDENT_AMBULATORY_CARE_PROVIDER_SITE_OTHER): Payer: Medicare HMO | Admitting: *Deleted

## 2019-06-11 DIAGNOSIS — I480 Paroxysmal atrial fibrillation: Secondary | ICD-10-CM

## 2019-06-11 LAB — CUP PACEART REMOTE DEVICE CHECK
Date Time Interrogation Session: 20210124233904
Implantable Pulse Generator Implant Date: 20200129

## 2019-06-18 ENCOUNTER — Other Ambulatory Visit: Payer: Self-pay

## 2019-06-19 ENCOUNTER — Ambulatory Visit (INDEPENDENT_AMBULATORY_CARE_PROVIDER_SITE_OTHER): Payer: Medicare HMO

## 2019-06-19 ENCOUNTER — Ambulatory Visit: Payer: Medicare HMO | Admitting: Physician Assistant

## 2019-06-19 ENCOUNTER — Encounter: Payer: Self-pay | Admitting: Physician Assistant

## 2019-06-19 VITALS — BP 122/70 | HR 60 | Temp 98.4°F | Ht 62.0 in | Wt 113.0 lb

## 2019-06-19 DIAGNOSIS — M542 Cervicalgia: Secondary | ICD-10-CM | POA: Diagnosis not present

## 2019-06-19 DIAGNOSIS — M5030 Other cervical disc degeneration, unspecified cervical region: Secondary | ICD-10-CM | POA: Diagnosis not present

## 2019-06-21 DIAGNOSIS — M542 Cervicalgia: Secondary | ICD-10-CM | POA: Insufficient documentation

## 2019-06-21 NOTE — Progress Notes (Signed)
Acute Office Visit  Subjective:    Patient ID: Evelyn [name], female    DOB: 01/09/1941, 79 y.o.   MRN: [id number]  Chief Complaint  Patient presents with  . Hair/Scalp Problem    C/O pop/crack with movement. posterior scalp    Neck Pain  This is a chronic problem. The current episode started in the past 7 days. The problem occurs constantly. The problem has been gradually worsening. The pain is present in the midline, left side and right side. The quality of the pain is described as aching and burning. The pain is moderate. The symptoms are aggravated by twisting, position and bending. The pain is same all the time. Associated symptoms include weakness. Pertinent negatives include no photophobia.     Past Medical History:  Diagnosis Date  . Aneurysm ([facility])   . Aortic insufficiency   . CAD (coronary artery disease)    CABG LIMA to LAD,vein graft to diagonal,vein graft to OM  . Dyslipidemia   . GERD (gastroesophageal reflux disease)   . Hypothyroid     Past Surgical History:  Procedure Laterality Date  . ABDOMINAL HYSTERECTOMY    . BACK SURGERY  2009  . CORONARY ANGIOPLASTY  2003   cutting balloon atherectomy in-stent stenosis of the LAD.  [name] CORONARY ANGIOPLASTY WITH STENT PLACEMENT  2002   remote PCI & stenting LAD,  . CORONARY ARTERY BYPASS GRAFT  2004   LIMA to LAD,vein graft to diagonal,vein graft to OM  . FOOT SURGERY    . LEFT HEART CATH AND CORS/GRAFTS ANGIOGRAPHY N/A 02/23/2017   Procedure: LEFT HEART CATH AND CORS/GRAFTS ANGIOGRAPHY;  Surgeon: Iran Ouch, MD;  Location: [facility];  Service: Cardiovascular;  Laterality: N/A;  . LEFT HEART CATHETERIZATION WITH CORONARY ANGIOGRAM N/A 12/15/2011   Procedure: LEFT HEART CATHETERIZATION WITH CORONARY ANGIOGRAM;  Surgeon: Thurmon Fair, MD;  Location: [facility];  Service: Cardiovascular;  Laterality: N/A;  . LOOP RECORDER INSERTION N/A 06/14/2018   Procedure: LOOP RECORDER INSERTION;  Surgeon:  Thurmon Fair, MD;  Location: [facility];  Service: Cardiovascular;  Laterality: N/A;  . NECK SURGERY    . NM MYOCAR PERF WALL MOTION  12/01/2011   normal study-persistent exercise induced ECG changes raises the concern for ischemia (balanced ischemia).  . NOSE SURGERY    . TONSILLECTOMY  1948  . TUBAL LIGATION      Family History  Problem Relation Age of Onset  . Heart attack Mother   . Heart disease Mother   . Heart failure Father   . Hypertension Father   . Heart disease Father   . Asthma Maternal Grandmother   . Heart disease Sister   . Stroke Brother   . Voice disorder Daughter        vocal dystonia  . Stroke Daughter   . Stroke Son   . Cancer Sister 85    Social History   Socioeconomic History  . Marital status: Widowed    Spouse name: Not on file  . Number of children: 2  . Years of education: Associates  . Highest education level: Associate degree: academic program  Occupational History  . Occupation: Retired  Tobacco Use  . Smoking status: Never Smoker  . Smokeless tobacco: Never Used  Substance and Sexual Activity  . Alcohol use: No  . Drug use: No  . Sexual activity: Not Currently  Other Topics Concern  . Not on file  Social History Narrative   Mother of  2 with 4 grandchildren   Right-handed   Caffeine: 8-10 cups coffee   Social Determinants of Health   Financial Resource Strain:   . Difficulty of Paying Living Expenses: Not on file  Food Insecurity:   . Worried About Charity fundraiser in the Last Year: Not on file  . Ran Out of Food in the Last Year: Not on file  Transportation Needs:   . Lack of Transportation (Medical): Not on file  . Lack of Transportation (Non-Medical): Not on file  Physical Activity:   . Days of Exercise per Week: Not on file  . Minutes of Exercise per Session: Not on file  Stress: Stress Concern Present  . Feeling of Stress : To some extent  Social Connections: Unknown  . Frequency of Communication with  Friends and Family: Not on file  . Frequency of Social Gatherings with Friends and Family: Once a week  . Attends Religious Services: Not on file  . Active Member of Clubs or Organizations: Not on file  . Attends Archivist Meetings: 1 to 4 times per year  . Marital Status: Not on file  Intimate Partner Violence: Unknown  . Fear of Current or Ex-Partner: Patient refused  . Emotionally Abused: Patient refused  . Physically Abused: Patient refused  . Sexually Abused: Patient refused    Outpatient Medications Prior to Visit  Medication Sig Dispense Refill  . ALPRAZolam (XANAX) 0.25 MG tablet Take 1 tablet (0.25 mg total) by mouth 3 (three) times daily as needed for anxiety. 30 tablet 0  . apixaban (ELIQUIS) 2.5 MG TABS tablet Take 1 tablet (2.5 mg total) by mouth 2 (two) times daily. 60 tablet 6  . diltiazem (CARDIZEM) 30 MG tablet Take 1 tablet (30 mg total) by mouth every 6 (six) hours as needed (for rapid heart rate). 30 tablet 2  . furosemide (LASIX) 20 MG tablet TAKE 1 TABLET BY MOUTH EVERY DAY 90 tablet 0  . HYDROcodone-acetaminophen (NORCO) 5-325 MG tablet Take 1 tablet by mouth every 6 (six) hours as needed for moderate pain. 120 tablet 0  . hydrocortisone 2.5 % cream APPLY TO AFFECTED AREA TWICE A DAY (Patient taking differently: Apply topically as needed. APPLY TO AFFECTED AREA TWICE A DAY) 28.35 g 5  . levothyroxine (SYNTHROID) 75 MCG tablet Take 1 tablet (75 mcg total) by mouth daily before breakfast. 30 tablet 6  . loratadine (CLARITIN) 10 MG tablet Take 1 tablet (10 mg total) by mouth daily. (Patient taking differently: Take 10 mg by mouth daily as needed. ) 90 tablet 3  . metoprolol tartrate (LOPRESSOR) 100 MG tablet TAKE 1 TABLET BY MOUTH TWICE A DAY 180 tablet 3  . nitroGLYCERIN (NITROSTAT) 0.4 MG SL tablet PLACE 1 TABLET (0.4 MG TOTAL) UNDER THE TONGUE EVERY 5 (FIVE) MINUTES AS NEEDED FOR CHEST PAIN. 25 tablet 0  . rosuvastatin (CRESTOR) 10 MG tablet TAKE 1 TABLET BY  MOUTH EVERY DAY IN THE EVENING 90 tablet 1  . rosuvastatin (CRESTOR) 20 MG tablet TAKE 1 TABLET BY MOUTH EVERY DAY IN THE EVENING 90 tablet 1  . Zoledronic Acid (RECLAST IV) Inject into the vein. Once yearly    . amoxicillin (AMOXIL) 500 MG capsule Take 500 mg by mouth as directed.    . isosorbide mononitrate (IMDUR) 60 MG 24 hr tablet TAKE 1 TABLET (60 MG TOTAL) DAILY BY MOUTH. 90 tablet 0  . levothyroxine (SYNTHROID) 75 MCG tablet Take 1 tablet (75 mcg total) by mouth daily. 90 tablet  0   No facility-administered medications prior to visit.    Allergies  Allergen Reactions  . Sulfa Antibiotics Swelling    Review of Systems  Constitutional: Negative.   HENT: Negative.   Eyes: Negative.  Negative for photophobia.  Respiratory: Negative.   Gastrointestinal: Negative.   Genitourinary: Negative.   Musculoskeletal: Positive for myalgias, neck pain and neck stiffness.  Neurological: Positive for weakness.       Objective:    Physical Exam Vitals and nursing note reviewed.  Constitutional:      Appearance: She is well-developed.  HENT:     Head: Normocephalic and atraumatic.  Eyes:     Conjunctiva/sclera: Conjunctivae normal.     Pupils: Pupils are equal, round, and reactive to light.  Cardiovascular:     Rate and Rhythm: Normal rate and regular rhythm.  Pulmonary:     Effort: Pulmonary effort is normal.     Breath sounds: Normal breath sounds.  Musculoskeletal:     Cervical back: Spasms and tenderness present. No deformity. Decreased range of motion.     Comments: Former surgery     BP 122/70   Pulse 60   Temp 98.4 F (36.9 C)   Ht 5\' 2"  (1.575 m)   Wt 113 lb (51.3 kg)   SpO2 96%   BMI 20.67 kg/m  Wt Readings from Last 3 Encounters:  06/19/19 113 lb (51.3 kg)  05/28/19 111 lb 9.6 oz (50.6 kg)  03/28/19 (P) 113 lb (51.3 kg)    Health Maintenance Due  Topic Date Due  . PNA vac Low Risk Adult (2 of 2 - PPSV23) 01/12/2019    There are no preventive care  reminders to display for this patient.   Lab Results  Component Value Date   TSH 0.040 (L) 04/27/2019   Lab Results  Component Value Date   WBC 8.7 10/13/2018   HGB 13.3 10/13/2018   HCT 39.3 10/13/2018   MCV 91 10/13/2018   PLT 241 10/13/2018   Lab Results  Component Value Date   NA 138 03/28/2019   K 4.4 03/28/2019   CO2 25 03/28/2019   GLUCOSE 94 03/28/2019   BUN 12 03/28/2019   CREATININE 0.57 03/28/2019   BILITOT 0.4 10/13/2018   ALKPHOS 53 10/13/2018   AST 15 10/13/2018   ALT 12 10/13/2018   PROT 6.5 10/13/2018   ALBUMIN 4.2 10/13/2018   CALCIUM 8.9 03/28/2019   ANIONGAP 8 03/28/2019   Lab Results  Component Value Date   CHOL 179 10/13/2018   Lab Results  Component Value Date   HDL 65 10/13/2018   Lab Results  Component Value Date   LDLCALC 99 10/13/2018   Lab Results  Component Value Date   TRIG 76 10/13/2018   Lab Results  Component Value Date   CHOLHDL 2.8 10/13/2018   Lab Results  Component Value Date   HGBA1C 5.8 (H) 09/17/2016       Assessment & Plan:     1. Cervical pain (neck) - DG Cervical Spine Complete; Future - Ambulatory referral to Neurosurgery  2. DDD (degenerative disc disease), cervical - Ambulatory referral to Neurosurgery    11/17/2016, PA-C   Remus Loffler PA-C [facility] [address]  [place], [place] [phone]

## 2019-06-25 ENCOUNTER — Ambulatory Visit: Payer: Medicare HMO | Admitting: Cardiovascular Disease

## 2019-06-25 ENCOUNTER — Encounter: Payer: Self-pay | Admitting: Cardiovascular Disease

## 2019-06-25 ENCOUNTER — Other Ambulatory Visit: Payer: Self-pay

## 2019-06-25 VITALS — BP 136/71 | HR 55 | Temp 97.1°F | Ht 62.0 in | Wt 114.0 lb

## 2019-06-25 DIAGNOSIS — I480 Paroxysmal atrial fibrillation: Secondary | ICD-10-CM | POA: Diagnosis not present

## 2019-06-25 DIAGNOSIS — I351 Nonrheumatic aortic (valve) insufficiency: Secondary | ICD-10-CM

## 2019-06-25 DIAGNOSIS — Z8673 Personal history of transient ischemic attack (TIA), and cerebral infarction without residual deficits: Secondary | ICD-10-CM

## 2019-06-25 DIAGNOSIS — I712 Thoracic aortic aneurysm, without rupture: Secondary | ICD-10-CM

## 2019-06-25 DIAGNOSIS — Z7901 Long term (current) use of anticoagulants: Secondary | ICD-10-CM | POA: Diagnosis not present

## 2019-06-25 DIAGNOSIS — E7800 Pure hypercholesterolemia, unspecified: Secondary | ICD-10-CM

## 2019-06-25 DIAGNOSIS — I25718 Atherosclerosis of autologous vein coronary artery bypass graft(s) with other forms of angina pectoris: Secondary | ICD-10-CM | POA: Diagnosis not present

## 2019-06-25 DIAGNOSIS — E039 Hypothyroidism, unspecified: Secondary | ICD-10-CM

## 2019-06-25 DIAGNOSIS — I10 Essential (primary) hypertension: Secondary | ICD-10-CM

## 2019-06-25 DIAGNOSIS — E0580 Other thyrotoxicosis without thyrotoxic crisis or storm: Secondary | ICD-10-CM

## 2019-06-25 DIAGNOSIS — I7121 Aneurysm of the ascending aorta, without rupture: Secondary | ICD-10-CM

## 2019-06-25 NOTE — Progress Notes (Signed)
Cardiology Office Note    Date:  06/25/2019   ID:  Evelyn [name], DOB 11-12-1940, MRN [id number]  PCP:  Terald Sleeper, PA-C  Cardiologist:   Sanda Klein, MD   No chief complaint on file.   History of Present Illness:  Evelyn [name] is a 79 y.o. female with long-standing history of coronary artery disease with previous stents to LAD followed by bypass surgery (LIMA to LAD, SVG to diagonal, SVG to OM in 2004), hyperlipidemia, recent TIA which led to implantation of a loop recorder that promptly demonstrated the presence of asymptomatic paroxysmal atrial fibrillation with rapid ventricular response, mild ascending aortic aneurysm, aortic insufficiency, hypertension, hyperlipidemia.  She is generally doing well.  She has infrequent episodes of palpitations less than once a week.  These are often brief and respond to a single dose of diltiazem.  She has never needed to take it more than once a day.  She also has infrequent episodes of angina.  One of them occurred around midnight and she had to take 3 separate sublingual tablets before subsided.  She does not think it was associated with arrhythmia.  This happens also less than once a week.  She tried taking Multaq but did not tolerate it due to dyspnea and nausea.  Labs performed in December showed that she is mildly hyperthyroid with a TSH that was suppressed at 0.04 and a free T4 that was elevated at 1.84.  Her dose of levothyroxine was reduced.  She has unchanged NYHA functional class II exertional dyspnea (climbing stairs).  May be a little better than last time we met.  Her device download in late January did not show any evidence of arrhythmia (after reduced dose of hormone supplement).  She had a previous episode suggesting TIA in May 2018.  She had a CT angiogram that did not show any evidence of meaningful extracranial carotid atherosclerosis or stenoses.  She also had an echocardiogram with a bubble study in May 2018 without  any evidence of intracardiac shunt.  She had normal carotid ultrasound in 2014.  Angiography in October 2018 showed virtually no change in coronary anatomy since 2013.  She has a chronically occluded LAD artery with multiple occluded stents, but has good downstream blood flow via LIMA-LAD and SVG-diagonal bypasses.  There is probably a significant area of myocardial septum that is ischemic, upstream of the blocked mid LAD.  There was no mention of a SVG bypass to the oblique marginal branch, but the left circumflex coronary artery was widely patent with no more than 20% stenosis (Chest CTA showed that the SVG-OM is now occluded).  Left ventricular regional wall motion and overall systolic function remain normal and the LVEDP was only 14 mmHg. Although the chest CT angiogram suggested there was a 70% stenosis in the left main coronary artery, that potentially could lead to ischemia in the circumflex territory, this was not found to be the case by invasive angiography.  Had an echocardiogram in May 2018, showing normal LVEF 55-60%, with inferior and inferoseptal mild hypokinesis, mildly dilated ascending aorta, mild aortic insufficiency. In September 2017 her treadmill nuclear stress test showed normal myocardial perfusion and EF 62%. In October 2017 she had the last evaluation of her aorta, showing mild aneurysmal dilation of the ascending aorta at 3.7 cm and of the aortic arch at 3.3 cm. (She is a very small frame lady, 5 foot 2 inches tall, 112 pounds).  CT angiography of the aorta October 2018 showed  unchanged aortic root dimension at 3.3 cm.  The SVG to OM was described as occluded.  She has long-standing history of coronary disease, undergoing her first cardiac catheterization in 1999. She received stents to the LAD artery in 2002 and had to have cutting balloon atherectomy for in-stent restenosis in 2003 followed by a new LAD stent in 2004 for disease progression. Ultimately after again developing  restenosis she underwent coronary bypass surgery (LIMA to LAD, SVG to diagonal, SVG to OM in 2004). She had cardiac catheterization in July 2013 and October 2018, without evidence of significant progression. This demonstrated interval total occlusion of the proximal LAD artery although the distant bypasses are all widely patent. On her stress test in 2013 and 2017  the perfusion images were "normal". It is felt that she may have occasional angina pectoris related to ischemia in the proximal septum since there is no retrograde flow from the bypasses to the proximal LAD. Her last echo in May 2018 showed normal EF 55-60%, but reported inferior and inferoseptal mild hypokinesis. 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  CT angiogram of the head and neck performed in May 2018 for TIA showed mild extracranial atherosclerosis. She has treated systemic hypertension and hyperlipidemia and hypothyroidism.  She had previous cervical spine surgery with bone grafting and discectomy in 2009.   Past Medical History:  Diagnosis Date  . Aneurysm ([address])   . Aortic insufficiency   . CAD (coronary artery disease)    CABG LIMA to LAD,vein graft to diagonal,vein graft to OM  . Dyslipidemia   . GERD (gastroesophageal reflux disease)   . Hypothyroid     Past Surgical History:  Procedure Laterality Date  . ABDOMINAL HYSTERECTOMY    . BACK SURGERY  2009  . CORONARY ANGIOPLASTY  2003   cutting balloon atherectomy in-stent stenosis of the LAD.  [name] CORONARY ANGIOPLASTY WITH STENT PLACEMENT  2002   remote PCI & stenting LAD,  . CORONARY ARTERY BYPASS GRAFT  2004   LIMA to LAD,vein graft to diagonal,vein graft to OM  . FOOT SURGERY    . LEFT HEART CATH AND CORS/GRAFTS ANGIOGRAPHY N/A 02/23/2017   Procedure: LEFT HEART CATH AND CORS/GRAFTS ANGIOGRAPHY;  Surgeon: Wellington Hampshire, MD;  Location: [facility];  Service: Cardiovascular;  Laterality: N/A;  . LEFT  HEART CATHETERIZATION WITH CORONARY ANGIOGRAM N/A 12/15/2011   Procedure: LEFT HEART CATHETERIZATION WITH CORONARY ANGIOGRAM;  Surgeon: Sanda Klein, MD;  Location: [facility];  Service: Cardiovascular;  Laterality: N/A;  . LOOP RECORDER INSERTION N/A 06/14/2018   Procedure: LOOP RECORDER INSERTION;  Surgeon: Sanda Klein, MD;  Location: [facility];  Service: Cardiovascular;  Laterality: N/A;  . NECK SURGERY    . NM MYOCAR PERF WALL MOTION  12/01/2011   normal study-persistent exercise induced ECG changes raises the concern for ischemia (balanced ischemia).  . NOSE SURGERY    . TONSILLECTOMY  1948  . TUBAL LIGATION      Current Medications: Outpatient Medications Prior to Visit  Medication Sig Dispense Refill  . ALPRAZolam (XANAX) 0.25 MG tablet Take 1 tablet (0.25 mg total) by mouth 3 (three) times daily as needed for anxiety. 30 tablet 0  . amoxicillin (AMOXIL) 500 MG capsule Take 500 mg by mouth as directed.    [name] apixaban (ELIQUIS) 2.5 MG TABS tablet Take 1 tablet (2.5 mg total) by mouth 2 (two) times daily. 60 tablet 6  . diltiazem (  CARDIZEM) 30 MG tablet Take 1 tablet (30 mg total) by mouth every 6 (six) hours as needed (for rapid heart rate). 30 tablet 2  . furosemide (LASIX) 20 MG tablet TAKE 1 TABLET BY MOUTH EVERY DAY 90 tablet 0  . HYDROcodone-acetaminophen (NORCO) 5-325 MG tablet Take 1 tablet by mouth every 6 (six) hours as needed for moderate pain. 120 tablet 0  . hydrocortisone 2.5 % cream APPLY TO AFFECTED AREA TWICE A DAY (Patient taking differently: Apply topically as needed. APPLY TO AFFECTED AREA TWICE A DAY) 28.35 g 5  . isosorbide mononitrate (IMDUR) 60 MG 24 hr tablet TAKE 1 TABLET (60 MG TOTAL) DAILY BY MOUTH. 90 tablet 0  . levothyroxine (SYNTHROID) 75 MCG tablet Take 1 tablet (75 mcg total) by mouth daily before breakfast. 30 tablet 6  . loratadine (CLARITIN) 10 MG tablet Take 1 tablet (10 mg total) by mouth daily. (Patient taking differently: Take 10 mg by  mouth daily as needed. ) 90 tablet 3  . metoprolol tartrate (LOPRESSOR) 100 MG tablet TAKE 1 TABLET BY MOUTH TWICE A DAY 180 tablet 3  . nitroGLYCERIN (NITROSTAT) 0.4 MG SL tablet PLACE 1 TABLET (0.4 MG TOTAL) UNDER THE TONGUE EVERY 5 (FIVE) MINUTES AS NEEDED FOR CHEST PAIN. 25 tablet 0  . rosuvastatin (CRESTOR) 10 MG tablet TAKE 1 TABLET BY MOUTH EVERY DAY IN THE EVENING 90 tablet 1  . rosuvastatin (CRESTOR) 20 MG tablet TAKE 1 TABLET BY MOUTH EVERY DAY IN THE EVENING 90 tablet 1  . Zoledronic Acid (RECLAST IV) Inject into the vein. Once yearly     No facility-administered medications prior to visit.     Allergies:   Sulfa antibiotics   Social History   Socioeconomic History  . Marital status: Widowed    Spouse name: Not on file  . Number of children: 2  . Years of education: Associates  . Highest education level: Associate degree: academic program  Occupational History  . Occupation: Retired  Tobacco Use  . Smoking status: Never Smoker  . Smokeless tobacco: Never Used  Substance and Sexual Activity  . Alcohol use: No  . Drug use: No  . Sexual activity: Not Currently  Other Topics Concern  . Not on file  Social History Narrative   Mother of 2 with 4 grandchildren   Right-handed   Caffeine: 8-10 cups coffee   Social Determinants of Health   Financial Resource Strain:   . Difficulty of Paying Living Expenses: Not on file  Food Insecurity:   . Worried About Charity fundraiser in the Last Year: Not on file  . Ran Out of Food in the Last Year: Not on file  Transportation Needs:   . Lack of Transportation (Medical): Not on file  . Lack of Transportation (Non-Medical): Not on file  Physical Activity:   . Days of Exercise per Week: Not on file  . Minutes of Exercise per Session: Not on file  Stress: Stress Concern Present  . Feeling of Stress : To some extent  Social Connections: Unknown  . Frequency of Communication with Friends and Family: Not on file  . Frequency of  Social Gatherings with Friends and Family: Once a week  . Attends Religious Services: Not on file  . Active Member of Clubs or Organizations: Not on file  . Attends Archivist Meetings: 1 to 4 times per year  . Marital Status: Not on file     Family History:  The patient's family history includes  Asthma in her maternal grandmother; Cancer (age of onset: 23) in her sister; Heart attack in her mother; Heart disease in her father, mother, and sister; Heart failure in her father; Hypertension in her father; Stroke in her brother, daughter, and son; Voice disorder in her daughter.   ROS:   Please see the history of present illness.    ROS All other systems are reviewed and are negative.   PHYSICAL EXAM:   VS:  BP 136/71   Pulse (!) 55   Temp (!) 97.1 F (36.2 C)   Ht 5' 2"  (1.575 m)   Wt 114 lb (51.7 kg)   SpO2 99%   BMI 20.85 kg/m       General: Alert, oriented x3, no distress, lean Head: no evidence of trauma, PERRL, EOMI, no exophtalmos or lid lag, no myxedema, no xanthelasma; normal ears, nose and oropharynx Neck: normal jugular venous pulsations and no hepatojugular reflux; brisk carotid pulses without delay and no carotid bruits Chest: clear to auscultation, no signs of consolidation by percussion or palpation, normal fremitus, symmetrical and full respiratory excursions Cardiovascular: normal position and quality of the apical impulse, regular rhythm, normal first and second heart sounds, no murmurs, rubs or gallops Abdomen: no tenderness or distention, no masses by palpation, no abnormal pulsatility or arterial bruits, normal bowel sounds, no hepatosplenomegaly Extremities: no clubbing, cyanosis or edema; 2+ radial, ulnar and brachial pulses bilaterally; 2+ right femoral, posterior tibial and dorsalis pedis pulses; 2+ left femoral, posterior tibial and dorsalis pedis pulses; no subclavian or femoral bruits Neurological: grossly nonfocal Psych: Normal mood and  affect   Wt Readings from Last 3 Encounters:  06/25/19 114 lb (51.7 kg)  06/19/19 113 lb (51.3 kg)  05/28/19 111 lb 9.6 oz (50.6 kg)      Studies/Labs Reviewed:   EKG:  EKG is ordered today.  I it shows normal sinus rhythm, nonspecific T wave changes (subtle T wave inversion V2-V3, unchanged from previous tracings), QTC 411 seconds. 10/13/2018: ALT 12; Hemoglobin 13.3; Platelets 241 03/28/2019: BUN 12; Creatinine, Ser 0.57; Potassium 4.4; Sodium 138 04/27/2019: TSH 0.040   Lipid Panel    Component Value Date/Time   CHOL 179 10/13/2018 0907   TRIG 76 10/13/2018 0907   HDL 65 10/13/2018 0907   CHOLHDL 2.8 10/13/2018 0907   CHOLHDL 2.0 07/11/2014 0913   VLDL 12 07/11/2014 0913   LDLCALC 99 10/13/2018 0907     ASSESSMENT:    1. Paroxysmal atrial fibrillation (HCC)   2. 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 History of transient ischemic attack (TIA)   4. Long term (current) use of anticoagulants   5. Essential hypertension   6. Hypercholesterolemia   7. Nonrheumatic aortic (valve) insufficiency   8. Ascending aortic aneurysm ([place])   9. Hypothyroidism, unspecified type   10. Iatrogenic hyperthyroidism      PLAN:  In order of problems listed above:  1. AFib/AFlutter: It may be too soon to tell, but it appears that the frequency of A. fib has decreased after the reduced dose of levothyroxine.  She has a history of TIA and high embolic risk ([id number]).  Continue with anticoagulation. 2. CAD s/p CABG: She continues to have occasional episodes of angina pectoris, usually exertional, CCS 1-2, probably related to ischemia in the proximal septum downstream of the occluded LAD artery but upstream of the LIMA supplied territory.  Also has an occluded SVG to the oblique marginal artery,  although the native circumflex artery is patent.  Not on aspirin due to anticoagulation.  Had one troublesome episode that occurred at  night recently, but this has not repeated itself.   3. TIA: No recurrence since initiation of anticoagulant.   4. Anticoagulation: Dose adjusted for age and small body size, no bleeding complications. 5. HTN: Fair control.  Prefer use of beta-blockers since she also has arrhythmia.  Reminded her of the risk of rebound with abrupt withdrawal of beta-blockers. 6. HLP: On high-dose rosuvastatin she has achieved over 50% reduction in LDL level, although she is not at target less than 70.  Discussed Repatha but the cost is prohibitive. 7. 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  8. Asc Ao Aneurysm: Very small and stable in size.  Routine monitoring not indicated especially since I do not think we would ever consider an elective redo sternotomy in this 79 year old patient. 9. Acquired hypothyroidism/iatrogenic hyperthyroidism: It is possible that excessive environments of medication may have been causing the angina and palpitations stated occurring more frequently.  Seems to have improved over the last month with a repeat levothyroxine.  Time spent with patient: 40 minutes.  Medication Adjustments/Labs and Tests Ordered: Current medicines are reviewed at length with the patient today.  Concerns regarding medicines are outlined above.  Medication changes, Labs and Tests ordered today are listed in the Patient Instructions below. Patient Instructions  Medication Instructions:  No changes *If you need a refill on your cardiac medications before your next appointment, please call your pharmacy*  Lab Work: None ordered If you have labs (blood work) drawn today and your tests are completely normal, you will receive your results only by: [name] MyChart Message (if you have MyChart) OR . A paper copy in the mail If you have any lab test that is abnormal or we need to change your treatment, we will call you to review the results.  Testing/Procedures: None  ordered  Follow-Up: At [facility], you and your health needs are our priority.  As part of our continuing mission to provide you with exceptional heart care, we have created designated Provider Care Teams.  These Care Teams include your primary Cardiologist (physician) and Advanced Practice Providers (APPs -  Physician Assistants and Nurse Practitioners) who all work together to provide you with the care you need, when you need it.  Your next appointment:   6 month(s)  The format for your next appointment:   In Person  Provider:   Sanda Klein, MD      Signed, Sanda Klein, MD  06/25/2019 9:[address], [place], [place]  [address] Phone: [phone]; Fax: [phone]

## 2019-06-25 NOTE — Patient Instructions (Signed)
Medication Instructions:  No changes *If you need a refill on your cardiac medications before your next appointment, please call your pharmacy*  Lab Work: None ordered If you have labs (blood work) drawn today and your tests are completely normal, you will receive your results only by: . MyChart Message (if you have MyChart) OR . A paper copy in the mail If you have any lab test that is abnormal or we need to change your treatment, we will call you to review the results.  Testing/Procedures: None ordered  Follow-Up: At [facility], you and your health needs are our priority.  As part of our continuing mission to provide you with exceptional heart care, we have created designated Provider Care Teams.  These Care Teams include your primary Cardiologist (physician) and Advanced Practice Providers (APPs -  Physician Assistants and Nurse Practitioners) who all work together to provide you with the care you need, when you need it.  Your next appointment:   6 month(s)  The format for your next appointment:   In Person  Provider:   Mihai Croitoru, MD    

## 2019-07-12 ENCOUNTER — Ambulatory Visit (INDEPENDENT_AMBULATORY_CARE_PROVIDER_SITE_OTHER): Payer: Medicare HMO | Admitting: *Deleted

## 2019-07-12 DIAGNOSIS — I480 Paroxysmal atrial fibrillation: Secondary | ICD-10-CM

## 2019-07-12 LAB — CUP PACEART REMOTE DEVICE CHECK
Date Time Interrogation Session: 20210225000818
Implantable Pulse Generator Implant Date: 20200129

## 2019-07-12 NOTE — Progress Notes (Signed)
ILR Remote 

## 2019-08-01 ENCOUNTER — Ambulatory Visit: Payer: Medicare HMO | Admitting: Physician Assistant

## 2019-08-03 ENCOUNTER — Encounter: Payer: Self-pay | Admitting: Nurse Practitioner

## 2019-08-03 ENCOUNTER — Ambulatory Visit: Payer: Medicare HMO | Admitting: Nurse Practitioner

## 2019-08-03 ENCOUNTER — Ambulatory Visit (INDEPENDENT_AMBULATORY_CARE_PROVIDER_SITE_OTHER): Payer: Medicare HMO

## 2019-08-03 ENCOUNTER — Other Ambulatory Visit: Payer: Self-pay

## 2019-08-03 ENCOUNTER — Ambulatory Visit: Payer: Medicare HMO | Admitting: Physician Assistant

## 2019-08-03 VITALS — BP 109/60 | HR 57 | Temp 98.4°F | Resp 20 | Ht 62.0 in | Wt 115.0 lb

## 2019-08-03 DIAGNOSIS — G459 Transient cerebral ischemic attack, unspecified: Secondary | ICD-10-CM

## 2019-08-03 DIAGNOSIS — I10 Essential (primary) hypertension: Secondary | ICD-10-CM

## 2019-08-03 DIAGNOSIS — M5412 Radiculopathy, cervical region: Secondary | ICD-10-CM

## 2019-08-03 DIAGNOSIS — E782 Mixed hyperlipidemia: Secondary | ICD-10-CM

## 2019-08-03 DIAGNOSIS — E039 Hypothyroidism, unspecified: Secondary | ICD-10-CM

## 2019-08-03 DIAGNOSIS — I25718 Atherosclerosis of autologous vein coronary artery bypass graft(s) with other forms of angina pectoris: Secondary | ICD-10-CM

## 2019-08-03 DIAGNOSIS — I480 Paroxysmal atrial fibrillation: Secondary | ICD-10-CM

## 2019-08-03 DIAGNOSIS — F419 Anxiety disorder, unspecified: Secondary | ICD-10-CM

## 2019-08-03 DIAGNOSIS — E785 Hyperlipidemia, unspecified: Secondary | ICD-10-CM

## 2019-08-03 DIAGNOSIS — M5030 Other cervical disc degeneration, unspecified cervical region: Secondary | ICD-10-CM

## 2019-08-03 MED ORDER — FUROSEMIDE 20 MG PO TABS: 20.0000 mg | ORAL_TABLET | Freq: Every day | ORAL | 1 refills | Status: DC

## 2019-08-03 MED ORDER — DILTIAZEM HCL 30 MG PO TABS: 30.0000 mg | ORAL_TABLET | Freq: Four times a day (QID) | ORAL | 2 refills | Status: DC | PRN

## 2019-08-03 MED ORDER — ROSUVASTATIN CALCIUM 10 MG PO TABS: ORAL_TABLET | ORAL | 1 refills | Status: DC

## 2019-08-03 MED ORDER — HYDROCODONE-ACETAMINOPHEN 5-325 MG PO TABS: 1.0000 | ORAL_TABLET | Freq: Four times a day (QID) | ORAL | 0 refills | Status: DC | PRN

## 2019-08-03 MED ORDER — LEVOTHYROXINE SODIUM 75 MCG PO TABS: 75.0000 ug | ORAL_TABLET | Freq: Every day | ORAL | 6 refills | Status: DC

## 2019-08-03 MED ORDER — ROSUVASTATIN CALCIUM 20 MG PO TABS: ORAL_TABLET | ORAL | 1 refills | Status: DC

## 2019-08-03 MED ORDER — ISOSORBIDE MONONITRATE ER 60 MG PO TB24: ORAL_TABLET | ORAL | 1 refills | Status: DC

## 2019-08-03 NOTE — Progress Notes (Signed)
Subjective:    Patient ID: Evelyn [name], female    DOB: 04/13/1941, 79 y.o.   MRN: [id number]   Chief Complaint: Medical Management of Chronic Issues    HPI:  1. Essential hypertension No c/o chest pain, sob or headahce. Does not check bloood pressure at home. BP Readings from Last 3 Encounters:  08/03/19 109/60  06/25/19 136/71  06/19/19 122/70     2. 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 According to office note- no changes were made to plan of care.  3. Hypothyroidism, unspecified type No problems that aware of. No c/o fatigue or hrair loss  4. Mixed hyperlipidemia Does not watch diet and does little to no exercise.   5. DDD (degenerative disc disease), cervical Has had 3 disc replacement. She is seeing neurology and is currently doing physical therapy. She is on hydrocodone that she only takes prn. She has had her last rx of 120 pills since November. Overdose risk 230 ( 10% )  6. Anxiety She is on xanax as needed. She has not had meds filled in over a year. Takes very seldom. GAD 7 : Generalized Anxiety Score 08/03/2019 06/19/2019  Nervous, Anxious, on Edge 0 0  Control/stop worrying 0 0  Worry too much - different things 0 0  Trouble relaxing 0 0  Restless 0 0  Easily annoyed or irritable 0 0  Afraid - awful might happen 0 0  Total GAD 7 Score 0 0  Anxiety Difficulty Not difficult at all -       Outpatient Encounter Medications as of 08/03/2019  Medication Sig  . ALPRAZolam (XANAX) 0.25 MG tablet Take 1 tablet (0.25 mg total) by mouth 3 (three) times daily as needed for anxiety.  [name] apixaban (ELIQUIS) 2.5 MG TABS tablet Take 1 tablet (2.5 mg total) by mouth 2 (two) times daily.  [name] diltiazem (CARDIZEM) 30 MG tablet Take 1 tablet (30 mg total) by mouth every 6 (six) hours as needed (for rapid heart rate).  . furosemide (LASIX) 20 MG tablet TAKE 1 TABLET BY MOUTH EVERY DAY  .  HYDROcodone-acetaminophen (NORCO) 5-325 MG tablet Take 1 tablet by mouth every 6 (six) hours as needed for moderate pain.  . hydrocortisone 2.5 % cream APPLY TO AFFECTED AREA TWICE A DAY (Patient taking differently: Apply topically as needed. APPLY TO AFFECTED AREA TWICE A DAY)  . isosorbide mononitrate (IMDUR) 60 MG 24 hr tablet TAKE 1 TABLET (60 MG TOTAL) DAILY BY MOUTH.  [name] levothyroxine (SYNTHROID) 75 MCG tablet Take 1 tablet (75 mcg total) by mouth daily before breakfast.  . loratadine (CLARITIN) 10 MG tablet Take 1 tablet (10 mg total) by mouth daily. (Patient taking differently: Take 10 mg by mouth daily as needed. )  . metoprolol tartrate (LOPRESSOR) 100 MG tablet TAKE 1 TABLET BY MOUTH TWICE A DAY  . nitroGLYCERIN (NITROSTAT) 0.4 MG SL tablet PLACE 1 TABLET (0.4 MG TOTAL) UNDER THE TONGUE EVERY 5 (FIVE) MINUTES AS NEEDED FOR CHEST PAIN.  . rosuvastatin (CRESTOR) 10 MG tablet TAKE 1 TABLET BY MOUTH EVERY DAY IN THE EVENING  . rosuvastatin (CRESTOR) 20 MG tablet TAKE 1 TABLET BY MOUTH EVERY DAY IN THE EVENING  . Zoledronic Acid (RECLAST IV) Inject into the vein. Once yearly     Past Surgical History:  Procedure Laterality Date  . ABDOMINAL HYSTERECTOMY    . BACK SURGERY  2009  . CORONARY ANGIOPLASTY  2003  cutting balloon atherectomy in-stent stenosis of the LAD.  [name] CORONARY ANGIOPLASTY WITH STENT PLACEMENT  2002   remote PCI & stenting LAD,  . CORONARY ARTERY BYPASS GRAFT  2004   LIMA to LAD,vein graft to diagonal,vein graft to OM  . FOOT SURGERY    . LEFT HEART CATH AND CORS/GRAFTS ANGIOGRAPHY N/A 02/23/2017   Procedure: LEFT HEART CATH AND CORS/GRAFTS ANGIOGRAPHY;  Surgeon: Wellington Hampshire, MD;  Location: [place] [facility];  Service: Cardiovascular;  Laterality: N/A;  . LEFT HEART CATHETERIZATION WITH CORONARY ANGIOGRAM N/A 12/15/2011   Procedure: LEFT HEART CATHETERIZATION WITH CORONARY ANGIOGRAM;  Surgeon: Sanda Klein, MD;  Location: [place] C[facility];  Service: Cardiovascular;   Laterality: N/A;  . LOOP RECORDER INSERTION N/A 06/14/2018   Procedure: LOOP RECORDER INSERTION;  Surgeon: Sanda Klein, MD;  Location: [place] [facility];  Service: Cardiovascular;  Laterality: N/A;  . NECK SURGERY    . NM MYOCAR PERF WALL MOTION  12/01/2011   normal study-persistent exercise induced ECG changes raises the concern for ischemia (balanced ischemia).  . NOSE SURGERY    . TONSILLECTOMY  1948  . TUBAL LIGATION      Family History  Problem Relation Age of Onset  . Heart attack Mother   . Heart disease Mother   . Heart failure Father   . Hypertension Father   . Heart disease Father   . Asthma Maternal Grandmother   . Heart disease Sister   . Stroke Brother   . Voice disorder Daughter        vocal dystonia  . Stroke Daughter   . Stroke Son   . Cancer Sister 64    New complaints: None today  Social history: Lives by herself and family checks on her frequently  Controlled substance contract: 06/26/19    Review of Systems  Constitutional: Negative for diaphoresis.  Eyes: Negative for pain.  Respiratory: Negative for shortness of breath.   Cardiovascular: Negative for chest pain, palpitations and leg swelling.  Gastrointestinal: Negative for abdominal pain.  Endocrine: Negative for polydipsia.  Skin: Negative for rash.  Neurological: Negative for dizziness, weakness and headaches.  Hematological: Does not bruise/bleed easily.  All other systems reviewed and are negative.      Objective:   Physical Exam Vitals and nursing note reviewed.  Constitutional:      General: She is not in acute distress.    Appearance: Normal appearance. She is well-developed.  HENT:     Head: Normocephalic.     Nose: Nose normal.  Eyes:     Pupils: Pupils are equal, round, and reactive to light.  Neck:     Vascular: No carotid bruit or JVD.  Cardiovascular:     Rate and Rhythm: Normal rate and regular rhythm.     Heart sounds: Normal heart sounds.  Pulmonary:      Effort: Pulmonary effort is normal. No respiratory distress.     Breath sounds: Normal breath sounds. No wheezing or rales.  Chest:     Chest wall: No tenderness.  Abdominal:     General: Bowel sounds are normal. There is no distension or abdominal bruit.     Palpations: Abdomen is soft. There is no hepatomegaly, splenomegaly, mass or pulsatile mass.     Tenderness: There is no abdominal tenderness.  Musculoskeletal:        General: Normal range of motion.     Cervical back: Normal range of motion and neck supple.  Lymphadenopathy:  Cervical: No cervical adenopathy.  Skin:    General: Skin is warm and dry.  Neurological:     Mental Status: She is alert and oriented to person, place, and time.     Deep Tendon Reflexes: Reflexes are normal and symmetric.  Psychiatric:        Behavior: Behavior normal.        Thought Content: Thought content normal.        Judgment: Judgment normal.    BP 109/60   Pulse (!) 57   Temp 98.4 F (36.9 C) (Temporal)   Resp 20   Ht 5' 2"  (1.575 m)   Wt 115 lb (52.2 kg)   SpO2 96%   BMI 21.03 kg/m         Assessment & Plan:  Evelyn [name] comes in today with chief complaint of Medical Management of Chronic Issues   Diagnosis and orders addressed:  1. Essential hypertension Low sodium diet - CMP14+EGFR - CBC with Differential/Platelet - Lipid panel - furosemide (LASIX) 20 MG tablet; Take 1 tablet (20 mg total) by mouth daily.  Dispense: 90 tablet; Refill: 1  2. 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  Dispense: 90 tablet; Refill: 1  3. Hypothyroidism, unspecified type Labs oending - Thyroid Panel With TSH - levothyroxine (SYNTHROID) 75 MCG tablet; Take 1 tablet (75 mcg total) by mouth daily before breakfast.  Dispense:  30 tablet; Refill: 6  4. Dyslipidemia Low fat diet  5. Mixed hyperlipidemia Low fat diet - rosuvastatin (CRESTOR) 20 MG tablet; TAKE 1 TABLET BY MOUTH EVERY DAY IN THE EVENING  Dispense: 90 tablet; Refill: 1 - rosuvastatin (CRESTOR) 10 MG tablet; TAKE 1 TABLET BY MOUTH EVERY DAY IN THE EVENING  Dispense: 90 tablet; Refill: 1  6. DDD (degenerative disc disease), cervical Continue pain meds prn  7. Anxiety Stress management Will no longer rx xanax due to taking pain meds- patient understands  8. Cervical neuropathic pain quanity of pain meds was decreased form 120 to 90 tablets today - HYDROcodone-acetaminophen (NORCO) 5-325 MG tablet; Take 1 tablet by mouth every 6 (six) hours as needed for moderate pain.  Dispense: 90 tablet; Refill: 0  9. Transient cerebral ischemia, unspecified type - rosuvastatin (CRESTOR) 20 MG tablet; TAKE 1 TABLET BY MOUTH EVERY DAY IN THE EVENING  Dispense: 90 tablet; Refill: 1  10. Paroxysmal atrial fibrillation (HCC) keep follow up with crdiology - diltiazem (CARDIZEM) 30 MG tablet; Take 1 tablet (30 mg total) by mouth every 6 (six) hours as needed (for rapid heart rate).  Dispense: 30 tablet; Refill: 2   Labs pending Health Maintenance reviewed Diet and exercise encouraged  Follow up plan: 3 months   Mary-Margaret Hassell Done, FNP

## 2019-08-03 NOTE — Patient Instructions (Signed)
Stress, Adult Stress is a normal reaction to life events. Stress is what you feel when life demands more than you are used to, or more than you think you can handle. Some stress can be useful, such as studying for a test or meeting a deadline at work. Stress that occurs too often or for too long can cause problems. It can affect your emotional health and interfere with relationships and normal daily activities. Too much stress can weaken your body's defense system (immune system) and increase your risk for physical illness. If you already have a medical problem, stress can make it worse. What are the causes? All sorts of life events can cause stress. An event that causes stress for one person may not be stressful for another person. Major life events, whether positive or negative, commonly cause stress. Examples include:  Losing a job or starting a new job.  Losing a loved one.  Moving to a new town or home.  Getting married or divorced.  Having a baby.  Getting injured or sick. Less obvious life events can also cause stress, especially if they occur day after day or in combination with each other. Examples include:  Working long hours.  Driving in traffic.  Caring for children.  Being in debt.  Being in a difficult relationship. What are the signs or symptoms? Stress can cause emotional symptoms, including:  Anxiety. This is feeling worried, afraid, on edge, overwhelmed, or out of control.  Anger, including irritation or impatience.  Depression. This is feeling sad, down, helpless, or guilty.  Trouble focusing, remembering, or making decisions. Stress can cause physical symptoms, including:  Aches and pains. These may affect your head, neck, back, stomach, or other areas of your body.  Tight muscles or a clenched jaw.  Low energy.  Trouble sleeping. Stress can cause unhealthy behaviors, including:  Eating to feel better (overeating) or skipping meals.  Working too  much or putting off tasks.  Smoking, drinking alcohol, or using drugs to feel better. How is this diagnosed? Stress is diagnosed through an assessment by your health care provider. He or she may diagnose this condition based on:  Your symptoms and any stressful life events.  Your medical history.  Tests to rule out other causes of your symptoms. Depending on your condition, your health care provider may refer you to a specialist for further evaluation. How is this treated?  Stress management techniques are the recommended treatment for stress. Medicine is not typically recommended for the treatment of stress. Techniques to reduce your reaction to stressful life events include:  Stress identification. Monitor yourself for symptoms of stress and identify what causes stress for you. These skills may help you to avoid or prepare for stressful events.  Time management. Set your priorities, keep a calendar of events, and learn to say no. Taking these actions can help you avoid making too many commitments. Techniques for coping with stress include:  Rethinking the problem. Try to think realistically about stressful events rather than ignoring them or overreacting. Try to find the positives in a stressful situation rather than focusing on the negatives.  Exercise. Physical exercise can release both physical and emotional tension. The key is to find a form of exercise that you enjoy and do it regularly.  Relaxation techniques. These relax the body and mind. The key is to find one or more that you enjoy and use the techniques regularly. Examples include: ? Meditation, deep breathing, or progressive relaxation techniques. ? Yoga or   tai chi. ? Biofeedback, mindfulness techniques, or journaling. ? Listening to music, being out in nature, or participating in other hobbies.  Practicing a healthy lifestyle. Eat a balanced diet, drink plenty of water, limit or avoid caffeine, and get plenty of  sleep.  Having a strong support network. Spend time with family, friends, or other people you enjoy being around. Express your feelings and talk things over with someone you trust. Counseling or talk therapy with a mental health professional may be helpful if you are having trouble managing stress on your own. Follow these instructions at home: Lifestyle   Avoid drugs.  Do not use any products that contain nicotine or tobacco, such as cigarettes, e-cigarettes, and chewing tobacco. If you need help quitting, ask your health care provider.  Limit alcohol intake to no more than 1 drink a day for nonpregnant women and 2 drinks a day for men. One drink equals 12 oz of beer, 5 oz of wine, or 1 oz of hard liquor  Do not use alcohol or drugs to relax.  Eat a balanced diet that includes fresh fruits and vegetables, whole grains, lean meats, fish, eggs, and beans, and low-fat dairy. Avoid processed foods and foods high in added fat, sugar, and salt.  Exercise at least 30 minutes on 5 or more days each week.  Get 7-8 hours of sleep each night. General instructions   Practice stress management techniques as discussed with your health care provider.  Drink enough fluid to keep your urine clear or pale yellow.  Take over-the-counter and prescription medicines only as told by your health care provider.  Keep all follow-up visits as told by your health care provider. This is important. Contact a health care provider if:  Your symptoms get worse.  You have new symptoms.  You feel overwhelmed by your problems and can no longer manage them on your own. Get help right away if:  You have thoughts of hurting yourself or others. If you ever feel like you may hurt yourself or others, or have thoughts about taking your own life, get help right away. You can go to your nearest emergency department or call:  Your local emergency services (911 in the [place]  A suicide crisis helpline, such as the  [place] at (316) 250-6172. This is open 24 hours a day. Summary  Stress is a normal reaction to life events. It can cause problems if it happens too often or for too long.  Practicing stress management techniques is the best way to treat stress.  Counseling or talk therapy with a mental health professional may be helpful if you are having trouble managing stress on your own. This information is not intended to replace advice given to you by your health care provider. Make sure you discuss any questions you have with your health care provider. Document Revised: 12/01/2018 Document Reviewed: 06/23/2016 Elsevier Patient Education  [place].

## 2019-08-04 LAB — CMP14+EGFR
ALT: 12 IU/L (ref 0–32)
AST: 16 IU/L (ref 0–40)
Albumin/Globulin Ratio: 1.9 (ref 1.2–2.2)
Albumin: 4.5 g/dL (ref 3.6–4.6)
Alkaline Phosphatase: 57 IU/L (ref 39–117)
BUN/Creatinine Ratio: 18 (ref 12–28)
BUN: 12 mg/dL (ref 8–27)
Bilirubin Total: 0.5 mg/dL (ref 0.0–1.2)
CO2: 26 mmol/L (ref 20–29)
Calcium: 9.8 mg/dL (ref 8.7–10.3)
Chloride: 102 mmol/L (ref 96–106)
Creatinine, Ser: 0.65 mg/dL (ref 0.57–1.00)
GFR calc Af Amer: 96 mL/min/{1.73_m2} (ref >59)
GFR calc non Af Amer: 84 mL/min/{1.73_m2} (ref >59)
Globulin, Total: 2.4 g/dL (ref 1.5–4.5)
Glucose: 87 mg/dL (ref 65–99)
Potassium: 4.1 mmol/L (ref 3.5–5.2)
Sodium: 142 mmol/L (ref 134–144)
Total Protein: 6.9 g/dL (ref 6.0–8.5)

## 2019-08-04 LAB — CBC WITH DIFFERENTIAL/PLATELET
Basophils Absolute: 0.1 10*3/uL (ref 0.0–0.2)
Basos: 1 %
EOS (ABSOLUTE): 0.1 10*3/uL (ref 0.0–0.4)
Eos: 1 %
Hematocrit: 42.3 % (ref 34.0–46.6)
Hemoglobin: 14.4 g/dL (ref 11.1–15.9)
Immature Grans (Abs): 0 10*3/uL (ref 0.0–0.1)
Immature Granulocytes: 0 %
Lymphocytes Absolute: 3.1 10*3/uL (ref 0.7–3.1)
Lymphs: 43 %
MCH: 31.7 pg (ref 26.6–33.0)
MCHC: 34 g/dL (ref 31.5–35.7)
MCV: 93 fL (ref 79–97)
Monocytes Absolute: 0.8 10*3/uL (ref 0.1–0.9)
Monocytes: 11 %
Neutrophils Absolute: 3.3 10*3/uL (ref 1.4–7.0)
Neutrophils: 44 %
Platelets: 232 10*3/uL (ref 150–450)
RBC: 4.54 x10E6/uL (ref 3.77–5.28)
RDW: 13.7 % (ref 11.7–15.4)
WBC: 7.4 10*3/uL (ref 3.4–10.8)

## 2019-08-04 LAB — LIPID PANEL
Chol/HDL Ratio: 2 ratio (ref 0.0–4.4)
Cholesterol, Total: 137 mg/dL (ref 100–199)
HDL: 69 mg/dL (ref >39)
LDL Chol Calc (NIH): 54 mg/dL (ref 0–99)
Triglycerides: 70 mg/dL (ref 0–149)
VLDL Cholesterol Cal: 14 mg/dL (ref 5–40)

## 2019-08-04 LAB — THYROID PANEL WITH TSH
Free Thyroxine Index: 2.5 (ref 1.2–4.9)
T3 Uptake Ratio: 27 % (ref 24–39)
T4, Total: 9.3 ug/dL (ref 4.5–12.0)
TSH: 0.683 u[IU]/mL (ref 0.450–4.500)

## 2019-08-07 DIAGNOSIS — M5412 Radiculopathy, cervical region: Secondary | ICD-10-CM | POA: Insufficient documentation

## 2019-08-12 LAB — CUP PACEART REMOTE DEVICE CHECK
Date Time Interrogation Session: 20210328023742
Implantable Pulse Generator Implant Date: 20200129

## 2019-08-13 ENCOUNTER — Ambulatory Visit (INDEPENDENT_AMBULATORY_CARE_PROVIDER_SITE_OTHER): Payer: Medicare HMO | Admitting: *Deleted

## 2019-08-13 DIAGNOSIS — I480 Paroxysmal atrial fibrillation: Secondary | ICD-10-CM

## 2019-08-13 NOTE — Progress Notes (Signed)
ILR Remote 

## 2019-08-29 ENCOUNTER — Other Ambulatory Visit: Payer: Self-pay | Admitting: Cardiovascular Disease

## 2019-09-13 LAB — CUP PACEART REMOTE DEVICE CHECK
Date Time Interrogation Session: 20210428230903
Implantable Pulse Generator Implant Date: 20200129

## 2019-09-17 ENCOUNTER — Ambulatory Visit (INDEPENDENT_AMBULATORY_CARE_PROVIDER_SITE_OTHER): Payer: Medicare HMO | Admitting: *Deleted

## 2019-09-17 DIAGNOSIS — G459 Transient cerebral ischemic attack, unspecified: Secondary | ICD-10-CM | POA: Diagnosis not present

## 2019-09-18 NOTE — Progress Notes (Signed)
Carelink Summary Report / Loop Recorder 

## 2019-09-21 ENCOUNTER — Telehealth: Payer: Self-pay | Admitting: Nurse Practitioner

## 2019-09-21 NOTE — Telephone Encounter (Signed)
Called - appt made  

## 2019-09-24 ENCOUNTER — Other Ambulatory Visit: Payer: Self-pay

## 2019-09-24 ENCOUNTER — Encounter: Payer: Self-pay | Admitting: Nurse Practitioner

## 2019-09-24 ENCOUNTER — Ambulatory Visit: Payer: Medicare HMO | Admitting: Nurse Practitioner

## 2019-09-24 VITALS — BP 123/61 | HR 56 | Temp 98.0°F | Resp 20 | Ht 62.0 in | Wt 116.6 lb

## 2019-09-24 DIAGNOSIS — I25718 Atherosclerosis of autologous vein coronary artery bypass graft(s) with other forms of angina pectoris: Secondary | ICD-10-CM

## 2019-09-24 DIAGNOSIS — I712 Thoracic aortic aneurysm, without rupture: Secondary | ICD-10-CM | POA: Diagnosis not present

## 2019-09-24 DIAGNOSIS — F419 Anxiety disorder, unspecified: Secondary | ICD-10-CM

## 2019-09-24 DIAGNOSIS — E039 Hypothyroidism, unspecified: Secondary | ICD-10-CM

## 2019-09-24 DIAGNOSIS — I7121 Aneurysm of the ascending aorta, without rupture: Secondary | ICD-10-CM

## 2019-09-24 DIAGNOSIS — G459 Transient cerebral ischemic attack, unspecified: Secondary | ICD-10-CM

## 2019-09-24 DIAGNOSIS — I480 Paroxysmal atrial fibrillation: Secondary | ICD-10-CM

## 2019-09-24 DIAGNOSIS — I10 Essential (primary) hypertension: Secondary | ICD-10-CM | POA: Diagnosis not present

## 2019-09-24 DIAGNOSIS — E782 Mixed hyperlipidemia: Secondary | ICD-10-CM

## 2019-09-24 DIAGNOSIS — M5412 Radiculopathy, cervical region: Secondary | ICD-10-CM

## 2019-09-24 DIAGNOSIS — M5030 Other cervical disc degeneration, unspecified cervical region: Secondary | ICD-10-CM

## 2019-09-24 MED ORDER — APIXABAN 2.5 MG PO TABS: 2.5000 mg | ORAL_TABLET | Freq: Two times a day (BID) | ORAL | 1 refills | Status: DC

## 2019-09-24 MED ORDER — ROSUVASTATIN CALCIUM 10 MG PO TABS: ORAL_TABLET | ORAL | 1 refills | Status: DC

## 2019-09-24 MED ORDER — ISOSORBIDE MONONITRATE ER 60 MG PO TB24: ORAL_TABLET | ORAL | 1 refills | Status: DC

## 2019-09-24 MED ORDER — LEVOTHYROXINE SODIUM 75 MCG PO TABS: 75.0000 ug | ORAL_TABLET | Freq: Every day | ORAL | 6 refills | Status: DC

## 2019-09-24 MED ORDER — DILTIAZEM HCL 30 MG PO TABS: 30.0000 mg | ORAL_TABLET | Freq: Four times a day (QID) | ORAL | 2 refills | Status: DC | PRN

## 2019-09-24 MED ORDER — FUROSEMIDE 20 MG PO TABS: 20.0000 mg | ORAL_TABLET | Freq: Every day | ORAL | 1 refills | Status: DC

## 2019-09-24 MED ORDER — HYDROCODONE-ACETAMINOPHEN 5-325 MG PO TABS: 1.0000 | ORAL_TABLET | Freq: Four times a day (QID) | ORAL | 0 refills | Status: DC | PRN

## 2019-09-24 MED ORDER — ROSUVASTATIN CALCIUM 20 MG PO TABS: ORAL_TABLET | ORAL | 1 refills | Status: DC

## 2019-09-24 MED ORDER — METOPROLOL TARTRATE 100 MG PO TABS: 100.0000 mg | ORAL_TABLET | Freq: Two times a day (BID) | ORAL | 1 refills | Status: DC

## 2019-09-24 NOTE — Progress Notes (Signed)
Subjective:    Patient ID: Evelyn [name], female    DOB: 15-May-1941, 79 y.o.   MRN: [id number]   Chief Complaint: Medical Management of Chronic Issues    HPI:  1. Essential hypertension No c/o  Headache but have occasional OB and ches tpain, which I not new for her.. Doe not check blood presure at home BP Readings from Last 3 Encounters:  09/24/19 123/61  08/03/19 109/60  06/25/19 136/71     2. Ascending aortic aneurysm [facility]) Saw cardiology 2 months ago and had pacemaker checked. Patient has not had aorta size checked since 2018. We will recorder tet for follow up of size.  3. TIA (transient ischemic attack) Patient says she is not aware of any other mini troke  4. Hypothyroidism, unspecified type No problems that she is aware.  5. Anxiety Says she doesww not have any anxiety. She tries not to worry about things  6. Mixed hyperlipidemia She does not watch diet very closely. No dedicated exercise. Lab Results  Component Value Date   CHOL 137 08/03/2019   HDL 69 08/03/2019   LDLCALC 54 08/03/2019   TRIG 70 08/03/2019   CHOLHDL 2.0 08/03/2019      7. DDD (degenerative disc disease), cervical Pain assessment: Cause of pain- DDD cervical pine Pain location- mainly in neck Pain on scale of 1-10- rates pain 2/10  Frequency- daily What increases pain-to much activity What makes pain Better-rest Effects on ADL - none Any change in general medical condition-none  Current opioids rx- norco 5/325 only takes PRN 90 tablets last her 3 months # meds rx- 90 Effectiveness of current meds-helps Adverse reactions form pain meds-none Morphine equivalent-  Pill count performed-No Last drug screen - 10/13/18 ( high risk q52m, moderate risk q70m, low risk yearly ) Urine drug screen today- No Was the [facility] reviewed- yes  If yes were their any concerning findings? - no   Overdose risk: 0  Pain contract signed on:06/26/19     Outpatient Encounter Medications  as of 09/24/2019  Medication Sig  . amoxicillin (AMOXIL) 500 MG capsule Take 500 mg by mouth as directed.  . diltiazem (CARDIZEM) 30 MG tablet Take 1 tablet (30 mg total) by mouth every 6 (six) hours as needed (for rapid heart rate).  [name] ELIQUIS 2.5 MG TABS tablet TAKE 1 TABLET BY MOUTH TWICE A DAY  . furosemide (LASIX) 20 MG tablet Take 1 tablet (20 mg total) by mouth daily.  [name] HYDROcodone-acetaminophen (NORCO) 5-325 MG tablet Take 1 tablet by mouth every 6 (six) hours as needed for moderate pain.  . hydrocortisone 2.5 % cream APPLY TO AFFECTED AREA TWICE A DAY (Patient taking differently: Apply topically as needed. APPLY TO AFFECTED AREA TWICE A DAY)  . isosorbide mononitrate (IMDUR) 60 MG 24 hr tablet TAKE 1 TABLET (60 MG TOTAL) DAILY BY MOUTH.  [name] levothyroxine (SYNTHROID) 75 MCG tablet Take 1 tablet (75 mcg total) by mouth daily before breakfast.  . loratadine (CLARITIN) 10 MG tablet Take 1 tablet (10 mg total) by mouth daily. (Patient taking differently: Take 10 mg by mouth daily as needed. )  . metoprolol tartrate (LOPRESSOR) 100 MG tablet TAKE 1 TABLET BY MOUTH TWICE A DAY  . nitroGLYCERIN (NITROSTAT) 0.4 MG SL tablet PLACE 1 TABLET (0.4 MG TOTAL) UNDER THE TONGUE EVERY 5 (FIVE) MINUTES AS NEEDED FOR CHEST PAIN.  . rosuvastatin (CRESTOR) 10 MG tablet TAKE 1 TABLET BY MOUTH EVERY DAY IN THE EVENING  . rosuvastatin (CRESTOR)  20 MG tablet TAKE 1 TABLET BY MOUTH EVERY DAY IN THE EVENING  . Zoledronic Acid (RECLAST IV) Inject into the vein. Once yearly   No facility-administered encounter medications on file as of 09/24/2019.    Past Surgical History:  Procedure Laterality Date  . ABDOMINAL HYSTERECTOMY    . BACK SURGERY  2009  . CORONARY ANGIOPLASTY  2003   cutting balloon atherectomy in-stent stenosis of the LAD.  [name] CORONARY ANGIOPLASTY WITH STENT PLACEMENT  2002   remote PCI & stenting LAD,  . CORONARY ARTERY BYPASS GRAFT  2004   LIMA to LAD,vein graft to diagonal,vein graft to OM  .  FOOT SURGERY    . LEFT HEART CATH AND CORS/GRAFTS ANGIOGRAPHY N/A 02/23/2017   Procedure: LEFT HEART CATH AND CORS/GRAFTS ANGIOGRAPHY;  Surgeon: Iran Ouch, MD;  Location: [facility] INVASIVE CV LAB;  Service: Cardiovascular;  Laterality: N/A;  . LEFT HEART CATHETERIZATION WITH CORONARY ANGIOGRAM N/A 12/15/2011   Procedure: LEFT HEART CATHETERIZATION WITH CORONARY ANGIOGRAM;  Surgeon: Thurmon Fair, MD;  Location: [facility] CATH LAB;  Service: Cardiovascular;  Laterality: N/A;  . LOOP RECORDER INSERTION N/A 06/14/2018   Procedure: LOOP RECORDER INSERTION;  Surgeon: Thurmon Fair, MD;  Location: [facility] INVASIVE CV [facility];  Service: Cardiovascular;  Laterality: N/A;  . NECK SURGERY    . NM MYOCAR PERF WALL MOTION  12/01/2011   normal study-persistent exercise induced ECG changes raises the concern for ischemia (balanced ischemia).  . NOSE SURGERY    . TONSILLECTOMY  1948  . TUBAL LIGATION      Family History  Problem Relation Age of Onset  . Heart attack Mother   . Heart disease Mother   . Heart failure Father   . Hypertension Father   . Heart disease Father   . Asthma Maternal Grandmother   . Heart disease Sister   . Stroke Brother   . Voice disorder Daughter        vocal dystonia  . Stroke Daughter   . Stroke Son   . Cancer Sister 12    New complaints: None today  Social history: Her son lives next door     Review of Systems  Constitutional: Negative for diaphoresis.  Eyes: Negative for pain.  Respiratory: Negative for shortness of breath.   Cardiovascular: Negative for chest pain, palpitations and leg swelling.  Gastrointestinal: Negative for abdominal pain.  Endocrine: Negative for polydipsia.  Skin: Negative for rash.  Neurological: Negative for dizziness, weakness and headaches.  Hematological: Does not bruise/bleed easily.  All other systems reviewed and are negative.      Objective:   Physical Exam Vitals and nursing note reviewed.  Constitutional:      General: She  is not in acute distress.    Appearance: Normal appearance. She is well-developed.  HENT:     Head: Normocephalic.     Nose: Nose normal.  Eyes:     Pupils: Pupils are equal, round, and reactive to light.  Neck:     Vascular: No carotid bruit or JVD.  Cardiovascular:     Rate and Rhythm: Normal rate and regular rhythm.     Heart sounds: Normal heart sounds.  Pulmonary:     Effort: Pulmonary effort is normal. No respiratory distress.     Breath sounds: Normal breath sounds. No wheezing or rales.  Chest:     Chest wall: No tenderness.  Abdominal:     General: Bowel sounds are normal. There is no distension or abdominal bruit.  Palpations: Abdomen is soft. There is no hepatomegaly, splenomegaly, mass or pulsatile mass.     Tenderness: There is no abdominal tenderness.  Musculoskeletal:        General: Normal range of motion.     Cervical back: Normal range of motion and neck supple.  Lymphadenopathy:     Cervical: No cervical adenopathy.  Skin:    General: Skin is warm and dry.  Neurological:     Mental Status: She is alert and oriented to person, place, and time.     Deep Tendon Reflexes: Reflexes are normal and symmetric.  Psychiatric:        Behavior: Behavior normal.        Thought Content: Thought content normal.        Judgment: Judgment normal.    Blood pressure 123/61, pulse (!) 56, temperature 98 F (36.7 C), temperature source Temporal, resp. rate 20, height 5\' 2"  (1.575 m), weight 116 lb 9.6 oz (52.9 kg), SpO2 97 %.         Assessment & Plan:  Evelyn [name] comes in today with chief complaint of Medical Management of Chronic Issues   Diagnosis and orders addressed:  1. Essential hypertension Lo sodium diet - furosemide (LASIX) 20 MG tablet; Take 1 tablet (20 mg total) by mouth daily.  Dispense: 90 tablet; Refill: 1  2. Ascending aortic aneurysm ([place]) Will talk once scan are compltete - CT CORONARY FRACTIONAL FLOW RESERVE DATA PREP; Future - CT  CORONARY FRACTIONAL FLOW RESERVE FLUID ANALYSIS; Future  3. TIA (transient ischemic attack)  4. Hypothyroidism, unspecified type - levothyroxine (SYNTHROID) 75 MCG tablet; Take 1 tablet (75 mcg total) by mouth daily before breakfast.  Dispense: 30 tablet; Refill: 6  5. Anxiety Stress management  6. Mixed hyperlipidemia Low fat diet - rosuvastatin (CRESTOR) 20 MG tablet; TAKE 1 TABLET BY MOUTH EVERY DAY IN THE EVENING  Dispense: 90 tablet; Refill: 1 - rosuvastatin (CRESTOR) 10 MG tablet; TAKE 1 TABLET BY MOUTH EVERY DAY IN THE EVENING  Dispense: 90 tablet; Refill: 1  7. DDD (degenerative disc disease), cervical  8. Cervical neuropathic pain Moist heat Rest Try to make pain meds last 3 month - HYDROcodone-acetaminophen (NORCO) 5-325 MG tablet; Take 1 tablet by mouth every 6 (six) hours as needed for moderate pain.  Dispense: 90 tablet; Refill: 0  9. Paroxysmal atrial fibrillation (HCC) - apixaban (ELIQUIS) 2.5 MG TABS tablet; Take 1 tablet (2.5 mg total) by mouth 2 (two) times daily.  Dispense: 180 tablet; Refill: 1 - diltiazem (CARDIZEM) 30 MG tablet; Take 1 tablet (30 mg total) by mouth every 6 (six) hours as needed (for rapid heart rate).  Dispense: 30 tablet; Refill: 2 - metoprolol tartrate (LOPRESSOR) 100 MG tablet; Take 1 tablet (100 mg total) by mouth 2 (two) times daily.  Dispense: 180 tablet; Refill: 1  10. Transient cerebral ischemia, unspecified type - rosuvastatin (CRESTOR) 20 MG tablet; TAKE 1 TABLET BY MOUTH EVERY DAY IN THE EVENING  Dispense: 90 tablet; Refill: 1  11. 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  Dispense: 90 tablet; Refill: 1   Labs pending Health Maintenance reviewed Diet and exercise encouraged  Follow up plan: 3 months   Mary-Margaret Hassell Done, FNP

## 2019-09-24 NOTE — Patient Instructions (Signed)
Opioid Pain Medicine Management Opioid pain medicines are strong medicines that are used to treat bad or very bad pain. When you take them for a short time, they can help you:  Sleep better.  Do better in physical therapy.  Feel better during the first few days after you get hurt.  Recover from surgery. Only take these medicines if a doctor says that you can. You should only take them for a short time. This is because opioids can be hard to stop taking (they are addictive). The longer you take opioids, the harder it may be to stop taking them (opioid use disorder). What are the risks? Opioids can cause problems (side effects). Taking them for more than 3 days raises your chance of problems, such as:  Trouble pooping (constipation).  Feeling sick to your stomach (nausea).  Vomiting.  Feeling very sleepy.  Confusion.  Not being able to stop taking the medicine.  Breathing problems. Taking opioids for a long time can make it hard for you to do daily tasks. It can also put you at risk for:  Car accidents.  Depression.  Suicide.  Heart attack.  Taking too much of the medicine (overdose), which can sometimes lead to death. What is a pain treatment plan? A pain treatment plan is a plan made by you and your doctor. Work with your doctor to make a plan for treating your pain. To help you do this:  Talk about the goals of your treatment, including: ? How much pain you might expect to have. ? How you will manage the pain.  Talk about the risks and benefits of taking these medicines for your condition.  Remember that a good treatment plan uses more than one approach and lowers the risks of side effects.  Tell your doctor about the amount of medicines you take and about any drug or alcohol use.  Get your pain medicine prescriptions from only one doctor. Pain can be managed with other treatments. Work with your doctor to find other ways to help your pain, such as:  Physical  therapy.  Counseling.  Eating healthy foods.  Brain exercises.  Massage.  Meditation.  Other pain medicines.  Doing gentle exercises. Tapering your use of opioids If you have been taking opioids for more than a few weeks, you may need to slowly decrease (taper) how much you take until you stop taking them. Doing this can lower your chance of having symptoms, such as:  Pain and cramping in your belly (abdomen).  Feeling sick to your stomach.  Sweating.  Feeling very sleepy.  Feeling restless.  Shaking you cannot control (tremors).  Cravings for the medicine. Do not try to stop taking them by yourself. Work with your doctor to stop. Your doctor will help you take less until you are not taking the medicine at all. Follow these instructions at home: Safety and storage   While you are taking opioids: ? Do not drive. ? Do not use machines or power tools. ? Do not sign important papers (legal documents). ? Do not drink alcohol. ? Do not take sleeping pills. ? Do not take care of children by yourself. ? Do not do activities where you need to climb or be in high places, like working on a ladder. ? Do not go into any water, such as a lake, river, ocean, swimming pool, or hot tub.  Keep your opioids locked up or in a place where children cannot reach them.  Do not share your   pain medicine with anyone. Getting rid of leftover pills Do not save any leftover pills. Get rid of leftover pills safely by:  Taking them to a take-back program in your area.  Bringing them to a pharmacy that has a container for throwing away pills (pill disposal).  Throwing them in the trash. Check the label or package insert of your medicine to see whether this is safe to do. If it is safe to throw them out: 1. Take the pills out of their container. 2. Mix the pills with pet poop or food scraps. 3. Put this in the trash. Activity  Return to your normal activities as told by your doctor. Ask  your doctor what activities are safe for you.  Avoid doing things that make your pain worse.  Do exercises as told by your doctor. General instructions  You may need to take these actions to prevent or treat trouble pooping: ? Drink enough fluid to keep your pee (urine) pale yellow. ? Take over-the-counter or prescription medicines. ? Eat foods that are high in fiber. These include beans, whole grains, and fresh fruits and vegetables. ? Limit foods that are high in fat and sugar. These include fried or sweet foods.  Keep all follow-up visits as told by your doctor. This is important. Where to find support If you have been taking opioids for a long time, think about getting help quitting from a local support group or counselor. Ask your doctor about this. Where to find more information Centers for Disease Control and Prevention (CDC): www.cdc.gov Get help right away if: Seek medical care right away if you are taking opioids and you, or people close to you, notice any of the following:  You have trouble breathing.  Your breathing is slower or more shallow than normal.  You have a very slow heartbeat.  You feel very confused.  You pass out (faint).  You are very sleepy.  Your speech is not normal.  You feel sick to your stomach and vomit.  You have cold skin.  You have blue lips or fingernails.  Your muscles are weak (limp) and your body seems floppy.  The black centers of your eyes (pupils) are smaller than normal. If you think that you or someone else may have taken too much of an opioid medicine, get medical help right away. Call your local emergency services (911 in the [place] Do not drive yourself to the hospital. If you ever feel like you may hurt yourself or others, or have thoughts about taking your own life, get help right away. You can go to your nearest emergency department or call:  Your local emergency services (911 in the [place]  The hotline of the National  Poison Control Center (1-800-222-1222 in the [place]  A suicide crisis helpline, such as the National Suicide Prevention Lifeline at 1-800-273-8255. This is open 24 hours a day. Summary  Opioid are strong medicines that are used to treat bad or very bad pain.  A pain treatment plan is a plan made by you and your doctor. Work with your doctor to make a plan for treating your pain.  Work with your doctor to find other ways to help your pain.  If you think that you or someone else may have taken too much of an opioid, get help right away. This information is not intended to replace advice given to you by your health care provider. Make sure you discuss any questions you have with your health care provider.   Document Revised: 06/02/2018 Document Reviewed: 06/02/2018 Elsevier Patient Education  2020 Elsevier Inc.  

## 2019-10-04 ENCOUNTER — Telehealth: Payer: Self-pay | Admitting: Nurse Practitioner

## 2019-10-04 NOTE — Telephone Encounter (Signed)
This Is a follow up of aortic aneurysm. Do we need to end to cardiology to hav edone?

## 2019-10-08 NOTE — Telephone Encounter (Signed)
Yes Ma'am we can - I will just send her back to her previous office :) Thank You :)

## 2019-10-09 ENCOUNTER — Telehealth: Payer: Self-pay | Admitting: Nurse Practitioner

## 2019-10-09 NOTE — Telephone Encounter (Signed)
Hey - were we going to refer this Patient back to Cardiology?

## 2019-10-09 NOTE — Telephone Encounter (Signed)
Pt checking status on her appt being scheduled for her CT to check for aortic aneurysm. She will be home until 2pm today.

## 2019-10-16 ENCOUNTER — Ambulatory Visit (INDEPENDENT_AMBULATORY_CARE_PROVIDER_SITE_OTHER): Payer: Medicare HMO | Admitting: *Deleted

## 2019-10-16 DIAGNOSIS — G459 Transient cerebral ischemic attack, unspecified: Secondary | ICD-10-CM

## 2019-10-17 LAB — CUP PACEART REMOTE DEVICE CHECK
Date Time Interrogation Session: 20210529232237
Implantable Pulse Generator Implant Date: 20200129

## 2019-10-17 NOTE — Progress Notes (Signed)
Carelink Summary Report / Loop Recorder 

## 2019-10-19 ENCOUNTER — Telehealth: Payer: Self-pay | Admitting: Nurse Practitioner

## 2019-10-22 ENCOUNTER — Telehealth: Payer: Self-pay | Admitting: Nurse Practitioner

## 2019-10-22 NOTE — Telephone Encounter (Signed)
Patient has been informed multiple time that she will need to follow up with cardiologist.  Patient states that she was never informed this.  Patient was very rude and hateful and states.  Informed patient that information has been sent to cardiology and all she needed to do was contact their office.  Patient continues to argue with this nurse and states that she would like a call from [facility]

## 2019-10-22 NOTE — Telephone Encounter (Signed)
Attempted to call patient at 1:40pm on 10/22/19- no answer- no voice mail

## 2019-10-22 NOTE — Telephone Encounter (Signed)
Pt called requesting info on appt status with cardiologist. Explained to pt that on my end it looked like several appts had been made and then cancelled by them and advised pt to call them to see what the issue was with scheduling her. Pt got very rude when I advised her of this. Said that its not her job to call for appts ?? Also said she would be seeking a new doctors office. Told pt I would give MMM the message about this. Pt then hung up on me.

## 2019-10-23 ENCOUNTER — Telehealth: Payer: Self-pay | Admitting: Cardiovascular Disease

## 2019-10-23 DIAGNOSIS — I712 Thoracic aortic aneurysm, without rupture, unspecified: Secondary | ICD-10-CM

## 2019-10-23 DIAGNOSIS — Z01812 Encounter for preprocedural laboratory examination: Secondary | ICD-10-CM

## 2019-10-23 NOTE — Telephone Encounter (Signed)
Just to clarify for documentation purposes: the CT from 2017 showed a mildly dilated aortic arch at 3.3 cm, not in the field of view on the coronary CTA 2018.

## 2019-10-23 NOTE — Telephone Encounter (Signed)
CT angiogram chest/aorta + pre-test BMET ordered per MD communication.   Patient called with test information   She is aware a scheduler will contact her to arrange test time

## 2019-10-23 NOTE — Telephone Encounter (Signed)
I do not think any testing is necessary for her aorta.   The ascending aorta measured only 3.3 cm on the CT from 2018 (when older chest CT had reportedly shown an ascending aorta of 3.7 cm which is only borderline enlarged).  Her abdominal aorta was normal on ultrasound in 2019.  Definitely does not need a coronary CT with FFR.

## 2019-10-23 NOTE — Telephone Encounter (Signed)
Evelyn [name] is calling stating she is wanting to schedule her aneurysm screening per her PCP. I was unsure what she was talking about, but she requested I send Misty Stanley a message so she can call her back and discuss. Oliva states she will be leaving at 3:30 PM and won't be back until later this evening so she would be unable to answer the phone in that time frame. Please advise.

## 2019-10-23 NOTE — Telephone Encounter (Signed)
Patient's PCP has ordered a coronary CT with FFR Per chart review, patient has known ascending aortic aneurysm  Will send to primary cardiologist to advise what testing, if any, is needed

## 2019-10-24 ENCOUNTER — Telehealth: Payer: Self-pay | Admitting: Cardiovascular Disease

## 2019-10-24 NOTE — Telephone Encounter (Signed)
Spoke with patient regarding appointment scheduled for CTA chest aorta--Thursday 11/15/19 at 10:00 am at [facility] time is 9:45 am--1st floor admissions office. Liquids only 4 hours prior to study.  Patient to come to Dr. Erin Hearing office Firday 11/09/19 for BMET.  Patient voiced her understanding.

## 2019-10-25 NOTE — Telephone Encounter (Signed)
Has already seen cardiology

## 2019-11-01 ENCOUNTER — Other Ambulatory Visit: Payer: Self-pay

## 2019-11-01 ENCOUNTER — Other Ambulatory Visit: Payer: Medicare HMO

## 2019-11-01 DIAGNOSIS — E039 Hypothyroidism, unspecified: Secondary | ICD-10-CM

## 2019-11-01 DIAGNOSIS — E042 Nontoxic multinodular goiter: Secondary | ICD-10-CM

## 2019-11-02 LAB — TSH: TSH: 0.337 u[IU]/mL — ABNORMAL LOW (ref 0.450–4.500)

## 2019-11-02 LAB — T4, FREE: Free T4: 1.55 ng/dL (ref 0.82–1.77)

## 2019-11-07 ENCOUNTER — Ambulatory Visit: Payer: Medicare HMO | Admitting: "Endocrinology

## 2019-11-07 ENCOUNTER — Other Ambulatory Visit: Payer: Self-pay

## 2019-11-07 ENCOUNTER — Encounter: Payer: Self-pay | Admitting: "Endocrinology

## 2019-11-07 VITALS — BP 117/72 | HR 47 | Ht 62.0 in | Wt 117.0 lb

## 2019-11-07 DIAGNOSIS — E039 Hypothyroidism, unspecified: Secondary | ICD-10-CM | POA: Diagnosis not present

## 2019-11-07 DIAGNOSIS — E042 Nontoxic multinodular goiter: Secondary | ICD-10-CM

## 2019-11-07 NOTE — Progress Notes (Signed)
11/07/2019      Endocrinology follow-up note    Subjective:    Patient ID: Evelyn [name], female    DOB: June 18, 1940, PCP Bennie Pierini, FNP   Past Medical History:  Diagnosis Date  . Aneurysm (HCC)   . Aortic insufficiency   . CAD (coronary artery disease)    CABG LIMA to LAD,vein graft to diagonal,vein graft to OM  . Dyslipidemia   . GERD (gastroesophageal reflux disease)   . Hypothyroid    Past Surgical History:  Procedure Laterality Date  . ABDOMINAL HYSTERECTOMY    . BACK SURGERY  2009  . CORONARY ANGIOPLASTY  2003   cutting balloon atherectomy in-stent stenosis of the LAD.  [name] CORONARY ANGIOPLASTY WITH STENT PLACEMENT  2002   remote PCI & stenting LAD,  . CORONARY ARTERY BYPASS GRAFT  2004   LIMA to LAD,vein graft to diagonal,vein graft to OM  . FOOT SURGERY    . LEFT HEART CATH AND CORS/GRAFTS ANGIOGRAPHY N/A 02/23/2017   Procedure: LEFT HEART CATH AND CORS/GRAFTS ANGIOGRAPHY;  Surgeon: Iran Ouch, MD;  Location: [facility];  Service: Cardiovascular;  Laterality: N/A;  . LEFT HEART CATHETERIZATION WITH CORONARY ANGIOGRAM N/A 12/15/2011   Procedure: LEFT HEART CATHETERIZATION WITH CORONARY ANGIOGRAM;  Surgeon: Thurmon Fair, MD;  Location: [facility]ATH [facility];  Service: Cardiovascular;  Laterality: N/A;  . LOOP RECORDER INSERTION N/A 06/14/2018   Procedure: LOOP RECORDER INSERTION;  Surgeon: Thurmon Fair, MD;  Location: [facility];  Service: Cardiovascular;  Laterality: N/A;  . NECK SURGERY    . NM MYOCAR PERF WALL MOTION  12/01/2011   normal study-persistent exercise induced ECG changes raises the concern for ischemia (balanced ischemia).  . NOSE SURGERY    . TONSILLECTOMY  1948  . TUBAL LIGATION     Social History   Socioeconomic History  . Marital status: Widowed    Spouse name: Not on file  . Number of children: 2  . Years of education: Associates  . Highest education level: Associate degree: academic program  Occupational  History  . Occupation: Retired  Tobacco Use  . Smoking status: Never Smoker  . Smokeless tobacco: Never Used  Vaping Use  . Vaping Use: Never used  Substance and Sexual Activity  . Alcohol use: No  . Drug use: No  . Sexual activity: Not Currently  Other Topics Concern  . Not on file  Social History Narrative   Mother of 2 with 4 grandchildren   Right-handed   Caffeine: 8-10 cups coffee   Social Determinants of Health   Financial Resource Strain:   . Difficulty of Paying Living Expenses:   Food Insecurity:   . Worried About Programme researcher, broadcasting/film/video in the Last Year:   . Barista in the Last Year:   Transportation Needs:   . Freight forwarder (Medical):   [name] Lack of Transportation (Non-Medical):   Physical Activity:   . Days of Exercise per Week:   . Minutes of Exercise per Session:   Stress: Stress Concern Present  . Feeling of Stress : To some extent  Social Connections: Unknown  . Frequency of Communication with Friends and Family: Not on file  . Frequency of Social Gatherings with Friends and Family: Once a week  . Attends Religious Services: Not on file  . Active Member of Clubs or Organizations: Not on file  . Attends Banker Meetings: 1 to 4 times per year  . Marital  Status: Not on file   Outpatient Encounter Medications as of 11/07/2019  Medication Sig  . apixaban (ELIQUIS) 2.5 MG TABS tablet Take 1 tablet (2.5 mg total) by mouth 2 (two) times daily.  [name] diltiazem (CARDIZEM) 30 MG tablet Take 1 tablet (30 mg total) by mouth every 6 (six) hours as needed (for rapid heart rate).  . furosemide (LASIX) 20 MG tablet Take 1 tablet (20 mg total) by mouth daily.  [name] HYDROcodone-acetaminophen (NORCO) 5-325 MG tablet Take 1 tablet by mouth every 6 (six) hours as needed for moderate pain.  . hydrocortisone 2.5 % cream APPLY TO AFFECTED AREA TWICE A DAY (Patient taking differently: Apply topically as needed. APPLY TO AFFECTED AREA TWICE A DAY)  . isosorbide  mononitrate (IMDUR) 60 MG 24 hr tablet TAKE 1 TABLET (60 MG TOTAL) DAILY BY MOUTH.  [name] levothyroxine (SYNTHROID) 75 MCG tablet Take 1 tablet (75 mcg total) by mouth daily before breakfast.  . loratadine (CLARITIN) 10 MG tablet Take 1 tablet (10 mg total) by mouth daily. (Patient taking differently: Take 10 mg by mouth daily as needed. )  . metoprolol tartrate (LOPRESSOR) 100 MG tablet Take 1 tablet (100 mg total) by mouth 2 (two) times daily.  . nitroGLYCERIN (NITROSTAT) 0.4 MG SL tablet PLACE 1 TABLET (0.4 MG TOTAL) UNDER THE TONGUE EVERY 5 (FIVE) MINUTES AS NEEDED FOR CHEST PAIN.  . rosuvastatin (CRESTOR) 10 MG tablet TAKE 1 TABLET BY MOUTH EVERY DAY IN THE EVENING  . rosuvastatin (CRESTOR) 20 MG tablet TAKE 1 TABLET BY MOUTH EVERY DAY IN THE EVENING  . Zoledronic Acid (RECLAST IV) Inject into the vein. Once yearly   No facility-administered encounter medications on file as of 11/07/2019.   ALLERGIES: Allergies  Allergen Reactions  . Sulfa Antibiotics Swelling   VACCINATION STATUS: Immunization History  Administered Date(s) Administered  . Influenza, High Dose Seasonal PF 03/09/2016, 03/02/2017  . Pneumococcal Conjugate-13 01/11/2018    HPI  79 year old female with medical history as above. She is being seen in follow-up for management of her hypothyroidism and multinodular goiter. She is on levothyroxine 75 mcg p.o. daily before breakfast.  She is compliant with her medications.  She has no new complaints today.       - She is status post FNA 3 nodules in her thyroid benign findings.  Her previsit surveillance thyroid/neck ultrasound shows stable findings with no concerning nodules at this time.    -She was recently diagnosed with acute coronary syndrome on medical management. -Her previsit labs show evidence of under replacement.  - She denies family history of thyroid dysfunction. She feels occasional choking sensation in her neck, no recent voice change nor shortness of breath. She  denies any exposure to neck radiation. - She denies any history of smoking.  Review of Systems Limited as above.  Objective:    BP 117/72   Pulse (!) 47   Ht 5\' 2"  (1.575 m)   Wt 117 lb (53.1 kg)   BMI 21.40 kg/m   Wt Readings from Last 3 Encounters:  11/07/19 117 lb (53.1 kg)  09/24/19 116 lb 9.6 oz (52.9 kg)  08/03/19 115 lb (52.2 kg)    Physical Exam   CMP     Component Value Date/Time   NA 142 08/03/2019 1152   K 4.1 08/03/2019 1152   CL 102 08/03/2019 1152   CO2 26 08/03/2019 1152   GLUCOSE 87 08/03/2019 1152   GLUCOSE 94 03/28/2019 1104   BUN 12 08/03/2019 1152  CREATININE 0.65 08/03/2019 1152   CREATININE 0.59 07/11/2014 0913   CALCIUM 9.8 08/03/2019 1152   PROT 6.9 08/03/2019 1152   ALBUMIN 4.5 08/03/2019 1152   AST 16 08/03/2019 1152   ALT 12 08/03/2019 1152   ALKPHOS 57 08/03/2019 1152   BILITOT 0.5 08/03/2019 1152   GFRNONAA 84 08/03/2019 1152   GFRAA 96 08/03/2019 1152   Recent Results (from the past 2160 hour(s))  CUP PACEART REMOTE DEVICE CHECK     Status: None   Collection Time: 08/12/19  2:37 AM  Result Value Ref Range   Date Time Interrogation Session [id number]    Pulse Generator Manufacturer MERM    Pulse Gen Model TJQ30 Reveal LINQ    Pulse Gen Serial Number [id number] [place] [facility] [place]    Implantable Pulse Generator Type ICM/ILR    Implantable Pulse Generator Implant Date [id number]    Eval Rhythm SR 64 bpm   CUP PACEART REMOTE DEVICE CHECK     Status: None   Collection Time: 09/12/19 11:09 PM  Result Value Ref Range   Date Time Interrogation Session [id number]    Pulse Generator Manufacturer MERM    Pulse Gen Model [id number] Reveal LINQ    Pulse Gen Serial Number [id number]    Clinic [facility]    Implantable Pulse Generator Type ICM/ILR    Implantable Pulse Generator Implant Date [id number]   CUP PACEART REMOTE DEVICE CHECK     Status: None   Collection Time: 10/13/19 11:22 PM  Result Value Ref Range    Date Time Interrogation Session [id number]    Pulse Generator Manufacturer MERM    Pulse Gen Model IOM35 Reveal LINQ    Pulse Gen Serial Number [id number]    Cl[facility] Name [facility]    Implantable Pulse Generator Type ICM/ILR    Implantable Pulse Generator Implant Date [id number]   TSH     Status: Abnormal   Collection Time: 11/01/19 10:05 AM  Result Value Ref Range   TSH 0.337 (L) 0.450 - 4.500 uIU/mL  T4, Free     Status: None   Collection Time: 11/01/19 10:05 AM  Result Value Ref Range   Free T4 1.55 0.82 - 1.77 ng/dL   Fine-needle aspiration samples  of 3 nodules in her thyroid is benign  Repeat thyroid ultrasound on April 18, 2019: Right lobe 4 cm x 1.9 mm x 2.6 cm, left lobe 4.1 cm x 1.5 cm x 2.3 cm  There are small stable nodules 3 on the right and one on the left.  2 right-sided nodules and one nodule on the left were previously biopsied with benign findings.   Assessment & Plan:   1.  hypothyroidism 2. Multinodular goiter  -Her previsit thyroid function tests are consistent with appropriate replacement.  She is advised to continue  levothyroxine to 75 mcg p.o. daily before breakfast.     - We discussed about the correct intake of her thyroid hormone, on empty stomach at fasting, with water, separated by at least 30 minutes from breakfast and other medications,  and separated by more than 4 hours from calcium, iron, multivitamins, acid reflux medications (PPIs). -Patient is made aware of the fact that thyroid hormone replacement is needed for life, dose to be adjusted by periodic monitoring of thyroid function tests.   -  Prior Biopsy off  3 nodules is negative for malignancy.  She had stable findings on her surveillance thyroid ultrasound.  She will not need intervention at  this time.  She will have repeat thyroid ultrasound before her next visit in 1 year.   - I advised patient to maintain close follow up with Bennie Pierini, FNP for primary care  needs.     - Time spent on this patient care encounter:  20 minutes of which 50% was spent in  counseling and the rest reviewing  her current and  previous labs / studies and medications  doses and developing a plan for long term care. Evelyn [name]  participated in the discussions, expressed understanding, and voiced agreement with the above plans.  All questions were answered to her satisfaction. she is encouraged to contact clinic should she have any questions or concerns prior to her return visit.   Follow up plan: Return in about 1 year (around 11/06/2020) for Thyroid / Neck Ultrasound, F/U with Pre-visit Labs.  Marquis Lunch, MD Phone: [phone]  Fax: [phone]  -  This note was partially dictated with voice recognition software. Similar sounding words can be transcribed inadequately or may not  be corrected upon review.  11/07/2019, 12:22 PM

## 2019-11-08 ENCOUNTER — Ambulatory Visit: Payer: Self-pay | Admitting: Nurse Practitioner

## 2019-11-09 LAB — BASIC METABOLIC PANEL
BUN/Creatinine Ratio: 16 (ref 12–28)
BUN: 11 mg/dL (ref 8–27)
CO2: 24 mmol/L (ref 20–29)
Calcium: 9.3 mg/dL (ref 8.7–10.3)
Chloride: 104 mmol/L (ref 96–106)
Creatinine, Ser: 0.67 mg/dL (ref 0.57–1.00)
GFR calc Af Amer: 95 mL/min/{1.73_m2} (ref >59)
GFR calc non Af Amer: 83 mL/min/{1.73_m2} (ref >59)
Glucose: 86 mg/dL (ref 65–99)
Potassium: 4.3 mmol/L (ref 3.5–5.2)
Sodium: 140 mmol/L (ref 134–144)

## 2019-11-15 ENCOUNTER — Encounter (HOSPITAL_COMMUNITY): Payer: Self-pay

## 2019-11-15 ENCOUNTER — Other Ambulatory Visit: Payer: Self-pay

## 2019-11-15 ENCOUNTER — Telehealth: Payer: Self-pay

## 2019-11-15 ENCOUNTER — Ambulatory Visit (HOSPITAL_COMMUNITY)
Admission: RE | Admit: 2019-11-15 | Discharge: 2019-11-15 | Disposition: A | Discharge status: Home / Self Care | Payer: Medicare HMO | Source: Ambulatory Visit | Attending: Cardiovascular Disease | Admitting: Cardiovascular Disease

## 2019-11-15 DIAGNOSIS — I712 Thoracic aortic aneurysm, without rupture, unspecified: Secondary | ICD-10-CM

## 2019-11-15 MED ORDER — IOHEXOL 350 MG/ML SOLN
100.0000 mL | Freq: Once | INTRAVENOUS | Status: AC | PRN
  Administered 2019-11-15: 100 mL via INTRAVENOUS

## 2019-11-15 MED ORDER — SODIUM CHLORIDE (PF) 0.9 % IJ SOLN
INTRAMUSCULAR | Status: AC
  Filled 2019-11-15: qty 50

## 2019-11-15 NOTE — Telephone Encounter (Signed)
Happy to see her back to discuss options since she remains symptomatic.  Thanks.

## 2019-11-15 NOTE — Telephone Encounter (Signed)
[facility] alert received- Three new AF episodes, the longest 18 minutes.  There were four tachycardia arrhythmias detected.  The viewed EGM shows a  4 minute 57 second tachy episode that was 194 bpm.    Pt has known history of PAF on eliquis for OAC.  Current meds include Metoprolol 100mg  BID and PRN Diltiazem 30mg  Q 6hrs as needed for rapid heart rate.    EGMS received appear to show pt had tachy/ AF starting around 1724 yesterday lasting for almost 2 hours.  Not all EGMS avail, attempted to assist pt with sending a manual transmission to fill in missing data, her handheld monitor out of battery and needs to be charged.  Pt due for scheduled transmission tonight.    Spoke with pt, she indicates she was not doing well at time of episode.  She had chest pain, nausea and dizziness.  She had considered going to ED.  She took 1 dose of PRN Diltiazem and 2 separate doses of sublingual NTG before having any relief.  Today pt feel fine, denies any cardiac symptoms.  Pt getting ready to head to [facility] for Chest CT scan that was ordered at last OV.    Pt concerned that the feeling she had last night was similar to how she felt prior to CABG.

## 2019-11-15 NOTE — Telephone Encounter (Signed)
Hi, Will Episodes a little more frequent, longer and symptomatic. Very rapid V rates. She is on metoprolol 100 mg BID. Small build, lean. 79 yo. Do you think antiarrhythmics or ablation next? Thanks, [employer]

## 2019-11-15 NOTE — Telephone Encounter (Signed)
Spoke with pt, advised no changes in meds at this time.  MD recommends f/u with Dr. Elberta Fortis.  Sending to scheduling to contact pt.

## 2019-11-15 NOTE — Telephone Encounter (Signed)
Amy, could you please make that f/u appt for Dr. Elberta Fortis? My nurse is off today. Thanks!

## 2019-11-16 ENCOUNTER — Ambulatory Visit (INDEPENDENT_AMBULATORY_CARE_PROVIDER_SITE_OTHER): Payer: Medicare HMO | Admitting: *Deleted

## 2019-11-16 DIAGNOSIS — G459 Transient cerebral ischemic attack, unspecified: Secondary | ICD-10-CM

## 2019-11-16 LAB — CUP PACEART REMOTE DEVICE CHECK
Date Time Interrogation Session: 20210701232151
Implantable Pulse Generator Implant Date: 20200129

## 2019-11-20 NOTE — Telephone Encounter (Signed)
Message sent to scheduling to assist with an appointment with Dr. Elberta Fortis.

## 2019-11-21 NOTE — Progress Notes (Signed)
Carelink Summary Report / Loop Recorder 

## 2019-11-29 ENCOUNTER — Other Ambulatory Visit: Payer: Self-pay | Admitting: Nurse Practitioner

## 2019-11-29 DIAGNOSIS — M5412 Radiculopathy, cervical region: Secondary | ICD-10-CM

## 2019-12-18 ENCOUNTER — Ambulatory Visit: Payer: Medicare HMO | Admitting: Cardiology

## 2019-12-18 ENCOUNTER — Encounter: Payer: Self-pay | Admitting: Cardiology

## 2019-12-18 ENCOUNTER — Other Ambulatory Visit: Payer: Self-pay

## 2019-12-18 VITALS — BP 120/54 | HR 61 | Ht 62.0 in | Wt 116.0 lb

## 2019-12-18 DIAGNOSIS — E782 Mixed hyperlipidemia: Secondary | ICD-10-CM | POA: Diagnosis not present

## 2019-12-18 DIAGNOSIS — I25728 Atherosclerosis of autologous artery coronary artery bypass graft(s) with other forms of angina pectoris: Secondary | ICD-10-CM | POA: Diagnosis not present

## 2019-12-18 DIAGNOSIS — I480 Paroxysmal atrial fibrillation: Secondary | ICD-10-CM | POA: Diagnosis not present

## 2019-12-18 LAB — CUP PACEART INCLINIC DEVICE CHECK
Date Time Interrogation Session: 20210803135800
Implantable Pulse Generator Implant Date: 20200129

## 2019-12-18 MED ORDER — DRONEDARONE HCL 400 MG PO TABS: 400.0000 mg | ORAL_TABLET | Freq: Two times a day (BID) | ORAL | 3 refills | Status: DC

## 2019-12-18 NOTE — Patient Instructions (Addendum)
Medication Instructions:  Your physician has recommended you make the following change in your medication:   1.  Start taking dronedarone (Multaq) 400 mg- Take one capsule by mouth twice a day   Labwork: None ordered.  Testing/Procedures: None ordered.  Follow-Up: Your physician wants you to follow-up in: 3 months with Dr. Elberta Fortis.    March 11, 2020 at 1:45 pm at the [facility] ([facility] office     Any Other Special Instructions Will Be Listed Below (If Applicable).  If you need a refill on your cardiac medications before your next appointment, please call your pharmacy.   Dronedarone tablets What is this medicine? DRONEDARONE (droe NE da rone) is an antiarrhythmic drug. It helps make your heart beat regularly. This medicine may be used for other purposes; ask your health care provider or pharmacist if you have questions. COMMON BRAND NAME(S): Multaq What should I tell my health care provider before I take this medicine? They need to know if you have any of these conditions:  heart failure  history of irregular heartbeat  liver disease  liver or lung problems with the past use of amiodarone  low levels of magnesium in the blood  low levels of potassium in the blood  other heart disease  an unusual or allergic reaction to dronedarone, other medicines, foods, dyes, or preservatives  pregnant or trying to get pregnant  breast-feeding How should I use this medicine? Take this medicine by mouth with a glass of water. Follow the directions on the prescription label. Take one tablet with the morning meal and one tablet with the evening meal. Do not take your medicine more often than directed. Do not stop taking except on the advice of your doctor or health care professional. A special MedGuide will be given to you by the pharmacist with each prescription and refill. Be sure to read this information carefully each time. Talk to your pediatrician regarding the use of this  medicine in children. Special care may be needed. Overdosage: If you think you have taken too much of this medicine contact a poison control center or emergency room at once. NOTE: This medicine is only for you. Do not share this medicine with others. What if I miss a dose? If you miss a dose, take it as soon as you can. If it is almost time for your next dose, take only that dose. Do not take double or extra doses. What may interact with this medicine? Do not take this medicine with any of the following medications:  arsenic trioxide  certain antibiotics like clarithromycin, erythromycin, pentamidine, telithromycin, troleandomycin  certain medicines for depression like tricyclic antidepressants  certain medicines for fungal infections like fluconazole, itraconazole, ketoconazole, posaconazole, voriconazole  certain medicines for irregular heart beat like amiodarone, disopyramide, flecainide, ibutilide, quinidine, propafenone, sotalol  certain medicines for malaria like chloroquine, halofantrine  cisapride  cyclosporine  droperidol  haloperidol  methadone  other medicines that prolong the QT interval (cause an abnormal heart rhythm)  pimozide  nefazodone  phenothiazines like chlorpromazine, mesoridazine, prochlorperazine, thioridazine  ritonavir  ziprasidone This medicine may also interact with the following medications:  certain medicines for blood pressure, heart disease, or irregular heart beat like diltiazem, metoprolol, propranolol, verapamil  certain medicines for cholesterol like atorvastatin, lovastatin, simvastatin  certain medicines for seizures like carbamazepine, phenobarbital, phenytoin  digoxin  dofetilide  grapefruit juice  rifampin  sirolimus  St. John's Wort  tacrolimus This list may not describe all possible interactions. Give your health care  provider a list of all the medicines, herbs, non-prescription drugs, or dietary supplements you  use. Also tell them if you smoke, drink alcohol, or use illegal drugs. Some items may interact with your medicine. What should I watch for while using this medicine? Your condition will be monitored closely when you first begin therapy. Often, this drug is first started in a hospital or other monitored health care setting. Once you are on maintenance therapy, visit your doctor or health care professional for regular checks on your progress. Because your condition and use of this medicine carry some risk, it is a good idea to carry an identification card, necklace or bracelet with details of your condition, medications, and doctor or health care professional. Bonita Quin may get drowsy or dizzy. Do not drive, use machinery, or do anything that needs mental alertness until you know how this medicine affects you. Do not stand or sit up quickly, especially if you are an older patient. This reduces the risk of dizzy or fainting spells. What side effects may I notice from receiving this medicine? Side effects that you should report to your doctor or health care professional as soon as possible:  allergic reactions like skin rash, itching or hives, swelling of the face, lips, or tongue  breathing problems  cough  dark urine  fast, irregular heartbeat  general ill feeling or flu-like symptoms  light-colored stools  loss of appetite, nausea  right upper belly pain  slow heartbeat  stomach pain  swelling of the legs or ankles  unusually weak or tired  weight gain  yellowing of the eyes or skin Side effects that usually do not require medical attention (report to your doctor or health care professional if they continue or are bothersome):  nausea  vomiting  stomach pain This list may not describe all possible side effects. Call your doctor for medical advice about side effects. You may report side effects to FDA at 1-800-FDA-1088. Where should I keep my medicine? Keep out of the reach of  children. Store at room temperature between 15 and 30 degrees C (59 and 86 degrees F). Throw away any unused medicine after the expiration date. NOTE: This sheet is a summary. It may not cover all possible information. If you have questions about this medicine, talk to your doctor, pharmacist, or health care provider.  2020 Elsevier/Gold Standard (2018-04-24 10:43:10)

## 2019-12-18 NOTE — Progress Notes (Signed)
Electrophysiology Office Note   Date:  12/18/2019   ID:  Jazz, Biddy 25-Nov-1940, MRN [id number]  PCP:  Bennie Pierini, FNP  Cardiologist: Croitrou Primary Electrophysiologist:  Dionne Knoop Jorja Loa, MD    Chief Complaint: atrial fibrillation   History of Present Illness: Evelyn [name] is a 79 y.o. female who is being seen today for the evaluation of atrial fibrillation at the request of Arntz, Mary-Margaret, *. Presenting today for electrophysiology evaluation.  He has a history of atrial fibrillation/flutter, coronary artery disease, TIA hypertension, hyperlipidemia, aortic insufficiency, and ascending aortic aneurysm.  She has been having episodes of atrial fibrillation.  Her symptoms are palpitations, weakness, and fatigue.  Linq monitor shows an overall low burden.  She is currently on Multaq due to symptoms from her atrial fibrillation.  Today, denies symptoms of palpitations, chest pain, shortness of breath, orthopnea, PND, lower extremity edema, claudication, dizziness, presyncope, syncope, bleeding, or neurologic sequela. The patient is tolerating medications without difficulties.  Overall she is doing well.  She does have episodes of shortness of breath.  She was noted to have rapid atrial fibrillation on her Linq monitor which likely is the cause of some of her episodes of shortness of breath.  She feels well today, her shortness of breath is quite intermittent.   Past Medical History:  Diagnosis Date  . Aneurysm (HCC)   . Aortic insufficiency   . CAD (coronary artery disease)    CABG LIMA to LAD,vein graft to diagonal,vein graft to OM  . Dyslipidemia   . GERD (gastroesophageal reflux disease)   . Hypothyroid    Past Surgical History:  Procedure Laterality Date  . ABDOMINAL HYSTERECTOMY    . BACK SURGERY  2009  . CORONARY ANGIOPLASTY  2003   cutting balloon atherectomy in-stent stenosis of the LAD.  [name] CORONARY ANGIOPLASTY WITH STENT PLACEMENT  2002     remote PCI & stenting LAD,  . CORONARY ARTERY BYPASS GRAFT  2004   LIMA to LAD,vein graft to diagonal,vein graft to OM  . FOOT SURGERY    . LEFT HEART CATH AND CORS/GRAFTS ANGIOGRAPHY N/A 02/23/2017   Procedure: LEFT HEART CATH AND CORS/GRAFTS ANGIOGRAPHY;  Surgeon: Iran Ouch, MD;  [facility]: [facility];  Service: Cardiovascular;  Laterality: N/A;  . LEFT HEART CATHETERIZATION WITH CORONARY ANGIOGRAM N/A 12/15/2011   Procedure: LEFT HEART CATHETERIZATION WITH CORONARY ANGIOGRAM;  Surgeon: Thurmon Fair, MD;  Location: [facility];  Service: Cardiovascular;  Laterality: N/A;  . LOOP RECORDER INSERTION N/A 06/14/2018   Procedure: LOOP RECORDER INSERTION;  Surgeon: Thurmon Fair, MD;  Location: [facility];  Service: Cardiovascular;  Laterality: N/A;  . NECK SURGERY    . NM MYOCAR PERF WALL MOTION  12/01/2011   normal study-persistent exercise induced ECG changes raises the concern for ischemia (balanced ischemia).  . NOSE SURGERY    . TONSILLECTOMY  1948  . TUBAL LIGATION       Current Outpatient Medications  Medication Sig Dispense Refill  . amoxicillin (AMOXIL) 500 MG capsule Take 4 capsules by mouth as needed. Before dental procedures    . apixaban (ELIQUIS) 2.5 MG TABS tablet Take 1 tablet (2.5 mg total) by mouth 2 (two) times daily. 180 tablet 1  . diltiazem (CARDIZEM) 30 MG tablet Take 1 tablet (30 mg total) by mouth every 6 (six) hours as needed (for rapid heart rate). 30 tablet 2  . furosemide (LASIX) 20 MG tablet Take 1 tablet (20 mg total) by  mouth daily. 90 tablet 1  . HYDROcodone-acetaminophen (NORCO) 5-325 MG tablet Take 1 tablet by mouth every 6 (six) hours as needed for moderate pain. 90 tablet 0  . hydrocortisone 2.5 % cream APPLY TO AFFECTED AREA TWICE A DAY (Patient taking differently: Apply topically as needed. APPLY TO AFFECTED AREA TWICE A DAY) 28.35 g 5  . isosorbide mononitrate (IMDUR) 60 MG 24 hr tablet TAKE 1 TABLET (60 MG TOTAL) DAILY BY  MOUTH. 90 tablet 1  . levothyroxine (SYNTHROID) 75 MCG tablet Take 1 tablet (75 mcg total) by mouth daily before breakfast. 30 tablet 6  . loratadine (CLARITIN) 10 MG tablet Take 1 tablet (10 mg total) by mouth daily. (Patient taking differently: Take 10 mg by mouth daily as needed. ) 90 tablet 3  . metoprolol tartrate (LOPRESSOR) 100 MG tablet Take 1 tablet (100 mg total) by mouth 2 (two) times daily. 180 tablet 1  . nitroGLYCERIN (NITROSTAT) 0.4 MG SL tablet PLACE 1 TABLET (0.4 MG TOTAL) UNDER THE TONGUE EVERY 5 (FIVE) MINUTES AS NEEDED FOR CHEST PAIN. 25 tablet 0  . rosuvastatin (CRESTOR) 10 MG tablet TAKE 1 TABLET BY MOUTH EVERY DAY IN THE EVENING 90 tablet 1  . rosuvastatin (CRESTOR) 20 MG tablet TAKE 1 TABLET BY MOUTH EVERY DAY IN THE EVENING 90 tablet 1  . Zoledronic Acid (RECLAST IV) Inject into the vein. Once yearly    . dronedarone (MULTAQ) 400 MG tablet Take 1 tablet (400 mg total) by mouth 2 (two) times daily with a meal. 180 tablet 3   No current facility-administered medications for this visit.    Allergies:   Sulfa antibiotics   Social History:  The patient  reports that she has never smoked. She has never used smokeless tobacco. She reports that she does not drink alcohol and does not use drugs.   Family History:  The patient's family history includes Asthma in her maternal grandmother; Cancer (age of onset: 43) in her sister; Heart attack in her mother; Heart disease in her father, mother, and sister; Heart failure in her father; Hypertension in her father; Stroke in her brother, daughter, and son; Voice disorder in her daughter.    ROS:  Please see the history of present illness.   Otherwise, review of systems is positive for none.   All other systems are reviewed and negative.   PHYSICAL EXAM: VS:  BP (!) 120/54   Pulse 61   Ht 5\' 2"  (1.575 m)   Wt 116 lb (52.6 kg)   SpO2 97%   BMI 21.22 kg/m  , BMI Body mass index is 21.22 kg/m. GEN: Well nourished, well developed,  in no acute distress  HEENT: normal  Neck: no JVD, carotid bruits, or masses Cardiac: RRR; no murmurs, rubs, or gallops,no edema  Respiratory:  clear to auscultation bilaterally, normal work of breathing GI: soft, nontender, nondistended, + BS MS: no deformity or atrophy  Skin: warm and dry, device site well healed Neuro:  Strength and sensation are intact Psych: euthymic mood, full affect  EKG:  EKG is ordered today. Personal review of the ekg ordered shows sinus rhythm, rate 56  Personal review of the device interrogation today. Results in Paceart   Recent Labs: 08/03/2019: ALT 12; Hemoglobin 14.4; Platelets 232 11/01/2019: TSH 0.337 11/09/2019: BUN 11; Creatinine, Ser 0.67; Potassium 4.3; Sodium 140    Lipid Panel     Component Value Date/Time   CHOL 137 08/03/2019 1152   TRIG 70 08/03/2019 1152   HDL  69 08/03/2019 1152   CHOLHDL 2.0 08/03/2019 1152   CHOLHDL 2.0 07/11/2014 0913   VLDL 12 07/11/2014 0913   LDLCALC 54 08/03/2019 1152     Wt Readings from Last 3 Encounters:  12/18/19 116 lb (52.6 kg)  11/07/19 117 lb (53.1 kg)  09/24/19 116 lb 9.6 oz (52.9 kg)      Other studies Reviewed: Additional studies/ records that were reviewed today include: TTE 2018  Review of the above records today demonstrates:  - Left ventricle: The cavity size was normal. Systolic function was   normal. The estimated ejection fraction was in the range of 55%   to 60%. Mild hypokinesis of the inferior and inferoseptal   myocardium. Doppler parameters are consistent with abnormal left   ventricular relaxation (grade 1 diastolic dysfunction). Doppler   parameters are consistent with indeterminate ventricular filling   pressure. - Aortic valve: Transvalvular velocity was within the normal range.   There was no stenosis. There was mild regurgitation. - Aorta: Ascending aortic diameter: 37 mm (S). - Ascending aorta: The ascending aorta was mildly dilated. - Mitral valve: Transvalvular  velocity was within the normal range.   There was no evidence for stenosis. There was mild regurgitation. - Right ventricle: The cavity size was normal. Wall thickness was   normal. Systolic function was normal. - Atrial septum: No defect or patent foramen ovale was identified   by color flow Doppler or saline microcavitation study. - Tricuspid valve: There was moderate regurgitation. - Pulmonary arteries: Systolic pressure was within the normal   range. PA peak pressure: 27 mm Hg (S).  LHC 2018  The left ventricular systolic function is normal.  LV end diastolic pressure is mildly elevated.  The left ventricular ejection fraction is 55-65% by visual estimate.  Ost LAD to Mid LAD lesion, 100 %stenosed.  Ost Cx lesion, 20 %stenosed.  LM lesion, 20 %stenosed.  Prox RCA lesion, 20 %stenosed.  SVG and is normal in caliber and anatomically normal.  LIMA and is normal in caliber and anatomically normal.  ASSESSMENT AND PLAN:  1.  Paroxysmal atrial fibrillation: Currently on Eliquis metoprolol and as needed diltiazem.  CHA2DS2-VASc of 6.  She has had multiple episodes of rapid atrial fibrillation which make her feel quite short of breath.  I do feel that suppressing this with medications would likely be the best approach.  If she is not able to tolerate, or medications are too expensive, would potentially discuss ablation.  We Patsy Zaragoza start her on Multaq today as she does have coronary artery disease.  She may be able to tolerate dofetilide as well as her kidney function is good and her QT interval is not significantly prolonged.  2.  Coronary artery disease status post CABG: No current chest pain.    3.  Hyperlipidemia: Crestor per primary cardiology  Current medicines are reviewed at length with the patient today.   The patient does not have concerns regarding her medicines.  The following changes were made today: Start Multaq  Labs/ tests ordered today include:  Orders Placed This  Encounter  Procedures  . CUP PACEART INCLINIC DEVICE CHECK  . EKG 12-Lead     Disposition:   FU with Brenda Cowher 3 months  Signed, Jonita Hirota Jorja Loa, MD  12/18/2019 3:33 PM     [facility] [address] [place] [address] [phone] (office) [phone] (fax)

## 2019-12-19 ENCOUNTER — Telehealth: Payer: Self-pay | Admitting: Cardiology

## 2019-12-19 MED ORDER — DRONEDARONE HCL 400 MG PO TABS: 400.0000 mg | ORAL_TABLET | Freq: Two times a day (BID) | ORAL | 3 refills | Status: DC

## 2019-12-19 NOTE — Telephone Encounter (Signed)
Pt's medication was sent to pt's pharmacy as requested. Confirmation received.  °

## 2019-12-19 NOTE — Telephone Encounter (Signed)
Pt c/o medication issue:  1. Name of Medication: dronedarone (MULTAQ) 400 MG tablet  2. How are you currently taking this medication (dosage and times per day)? N/A  3. Are you having a reaction (difficulty breathing--STAT)? No  4. What is your medication issue? Patient states she went to pick up prescription at [facility] and she was told they are completely out of the medication and not aware of when they will receive an additional supply. She is [facility] [place] [place] - [address]

## 2019-12-24 ENCOUNTER — Ambulatory Visit (INDEPENDENT_AMBULATORY_CARE_PROVIDER_SITE_OTHER): Payer: Medicare HMO | Admitting: *Deleted

## 2019-12-24 DIAGNOSIS — G459 Transient cerebral ischemic attack, unspecified: Secondary | ICD-10-CM

## 2019-12-24 LAB — CUP PACEART REMOTE DEVICE CHECK
Date Time Interrogation Session: 20210803232023
Implantable Pulse Generator Implant Date: 20200129

## 2019-12-25 NOTE — Progress Notes (Signed)
Carelink Summary Report / Loop Recorder 

## 2019-12-26 ENCOUNTER — Telehealth: Payer: Self-pay | Admitting: Nurse Practitioner

## 2019-12-26 ENCOUNTER — Encounter: Payer: Self-pay | Admitting: Nurse Practitioner

## 2019-12-26 ENCOUNTER — Ambulatory Visit: Payer: Medicare HMO | Admitting: Nurse Practitioner

## 2019-12-26 ENCOUNTER — Other Ambulatory Visit: Payer: Self-pay

## 2019-12-26 VITALS — BP 109/62 | HR 57 | Temp 97.9°F | Resp 20 | Ht 62.0 in | Wt 115.0 lb

## 2019-12-26 DIAGNOSIS — M5412 Radiculopathy, cervical region: Secondary | ICD-10-CM

## 2019-12-26 DIAGNOSIS — I712 Thoracic aortic aneurysm, without rupture: Secondary | ICD-10-CM

## 2019-12-26 DIAGNOSIS — I480 Paroxysmal atrial fibrillation: Secondary | ICD-10-CM | POA: Diagnosis not present

## 2019-12-26 DIAGNOSIS — E039 Hypothyroidism, unspecified: Secondary | ICD-10-CM

## 2019-12-26 DIAGNOSIS — E782 Mixed hyperlipidemia: Secondary | ICD-10-CM

## 2019-12-26 DIAGNOSIS — I25718 Atherosclerosis of autologous vein coronary artery bypass graft(s) with other forms of angina pectoris: Secondary | ICD-10-CM

## 2019-12-26 DIAGNOSIS — I10 Essential (primary) hypertension: Secondary | ICD-10-CM

## 2019-12-26 DIAGNOSIS — F419 Anxiety disorder, unspecified: Secondary | ICD-10-CM

## 2019-12-26 DIAGNOSIS — I7121 Aneurysm of the ascending aorta, without rupture: Secondary | ICD-10-CM

## 2019-12-26 DIAGNOSIS — M5030 Other cervical disc degeneration, unspecified cervical region: Secondary | ICD-10-CM

## 2019-12-26 MED ORDER — HYDROCODONE-ACETAMINOPHEN 5-325 MG PO TABS: 1.0000 | ORAL_TABLET | Freq: Four times a day (QID) | ORAL | 0 refills | Status: DC | PRN

## 2019-12-26 MED ORDER — HYDROCODONE-ACETAMINOPHEN 5-325 MG PO TABS: 1.0000 | ORAL_TABLET | Freq: Three times a day (TID) | ORAL | 0 refills | Status: DC | PRN

## 2019-12-26 MED ORDER — DILTIAZEM HCL 30 MG PO TABS: 30.0000 mg | ORAL_TABLET | Freq: Four times a day (QID) | ORAL | 1 refills | Status: DC | PRN

## 2019-12-26 NOTE — Patient Instructions (Signed)

## 2019-12-26 NOTE — Addendum Note (Signed)
Addended by: Bennie Pierini on: 12/26/2019 02:42 PM   Modules accepted: Orders

## 2019-12-26 NOTE — Telephone Encounter (Signed)
Pt says that hydrocodone and multaq need to go to [facility]. She had an apt with MMM today.

## 2019-12-26 NOTE — Progress Notes (Signed)
Subjective:    Patient ID: Evelyn [name], female    DOB: 03-07-1941, 79 y.o.   MRN: [id number]   Chief Complaint: Medical Management of Chronic Issues    HPI:  1. Essential hypertension Denies chest pain, sob or headache. Does not check blood pressure at home. BP Readings from Last 3 Encounters:  12/26/19 109/62  12/18/19 (!) 120/54  11/07/19 117/72      2. Ascending aortic aneurysm (HCC) CT of heart was done and size was unchanaged.   3. Paroxysmal atrial fibrillation (HCC) Saw cardiology last week and they added mulitag BID. casuing slight nausea but has gotten better.  4. Hypothyroidism, unspecified type No problems that she is aware of. Lab Results  Component Value Date   TSH 0.337 (L) 11/01/2019    5. Anxiety Says she is doing well. denies any anxiety at all. GAD 7 : Generalized Anxiety Score 12/26/2019 09/24/2019 08/03/2019 06/19/2019  Nervous, Anxious, on Edge 0 0 0 0  Control/stop worrying 0 0 0 0  Worry too much - different things 0 0 0 0  Trouble relaxing 0 0 0 0  Restless 0 0 0 0  Easily annoyed or irritable - 0 0 0  Afraid - awful might happen 0 0 0 0  Total GAD 7 Score - 0 0 0  Anxiety Difficulty Not difficult at all Not difficult at all Not difficult at all -     6. Mixed hyperlipidemia Does watch diet and walks several days a week Lab Results  Component Value Date   CHOL 137 08/03/2019   HDL 69 08/03/2019   LDLCALC 54 08/03/2019   TRIG 70 08/03/2019   CHOLHDL 2.0 08/03/2019    7. Cervical neuropathic pain Pain assessment: Cause of pain- DDD Pain location- mainly in neck Pain on scale of 1-10- 4/10 Frequency- at least 4x a week What increases pain-to much activity What makes pain Better-res Effects on ADL - none Any change in general medical condition-none  Current opioids rx- norco 5/325  # meds rx- 120 Effectiveness of current meds-helps when she needs it Adverse reactions form pain meds-none Morphine equivalent- 20 MME  Pill  count performed-No Last drug screen - 10/13/18 ( high risk q80m, moderate risk q37m, low risk yearly ) Urine drug screen today- Yes Was the NCCSR reviewed- yes  If yes were their any concerning findings? - no   Overdose risk: 1   Pain contract signed on:06/26/19     Outpatient Encounter Medications as of 12/26/2019  Medication Sig   apixaban (ELIQUIS) 2.5 MG TABS tablet Take 1 tablet (2.5 mg total) by mouth 2 (two) times daily.   diltiazem (CARDIZEM) 30 MG tablet Take 1 tablet (30 mg total) by mouth every 6 (six) hours as needed (for rapid heart rate).   dronedarone (MULTAQ) 400 MG tablet Take 1 tablet (400 mg total) by mouth 2 (two) times daily with a meal.   furosemide (LASIX) 20 MG tablet Take 1 tablet (20 mg total) by mouth daily.   HYDROcodone-acetaminophen (NORCO) 5-325 MG tablet Take 1 tablet by mouth every 6 (six) hours as needed for moderate pain.   hydrocortisone 2.5 % cream APPLY TO AFFECTED AREA TWICE A DAY (Patient taking differently: Apply topically as needed. APPLY TO AFFECTED AREA TWICE A DAY)   isosorbide mononitrate (IMDUR) 60 MG 24 hr tablet TAKE 1 TABLET (60 MG TOTAL) DAILY BY MOUTH.   levothyroxine (SYNTHROID) 75 MCG tablet Take 1 tablet (75 mcg total) by mouth daily before  breakfast.   loratadine (CLARITIN) 10 MG tablet Take 1 tablet (10 mg total) by mouth daily. (Patient taking differently: Take 10 mg by mouth daily as needed. )   metoprolol tartrate (LOPRESSOR) 100 MG tablet Take 1 tablet (100 mg total) by mouth 2 (two) times daily.   nitroGLYCERIN (NITROSTAT) 0.4 MG SL tablet PLACE 1 TABLET (0.4 MG TOTAL) UNDER THE TONGUE EVERY 5 (FIVE) MINUTES AS NEEDED FOR CHEST PAIN.   rosuvastatin (CRESTOR) 10 MG tablet TAKE 1 TABLET BY MOUTH EVERY DAY IN THE EVENING   rosuvastatin (CRESTOR) 20 MG tablet TAKE 1 TABLET BY MOUTH EVERY DAY IN THE EVENING   Zoledronic Acid (RECLAST IV) Inject into the vein. Once yearly     Past Surgical History:  Procedure  Laterality Date   ABDOMINAL HYSTERECTOMY     BACK SURGERY  2009   CORONARY ANGIOPLASTY  2003   cutting balloon atherectomy in-stent stenosis of the LAD.   CORONARY ANGIOPLASTY WITH STENT PLACEMENT  2002   remote PCI & stenting LAD,   CORONARY ARTERY BYPASS GRAFT  2004   LIMA to LAD,vein graft to diagonal,vein graft to OM   FOOT SURGERY     LEFT HEART CATH AND CORS/GRAFTS ANGIOGRAPHY N/A 02/23/2017   Procedure: LEFT HEART CATH AND CORS/GRAFTS ANGIOGRAPHY;  Surgeon: Iran Ouch, MD;  [facility]: [facility];  Service: Cardiovascular;  Laterality: N/A;   LEFT HEART CATHETERIZATION WITH CORONARY ANGIOGRAM N/A 12/15/2011   Procedure: LEFT HEART CATHETERIZATION WITH CORONARY ANGIOGRAM;  Surgeon: Thurmon Fair, MD;  Location: [facility];  Service: Cardiovascular;  Laterality: N/A;   LOOP RECORDER INSERTION N/A 06/14/2018   Procedure: LOOP RECORDER INSERTION;  Surgeon: Thurmon Fair, MD;  Location: [facility];  Service: Cardiovascular;  Laterality: N/A;   NECK SURGERY     NM MYOCAR PERF WALL MOTION  12/01/2011   normal study-persistent exercise induced ECG changes raises the concern for ischemia (balanced ischemia).   NOSE SURGERY     TONSILLECTOMY  1948   TUBAL LIGATION      Family History  Problem Relation Age of Onset   Heart attack Mother    Heart disease Mother    Heart failure Father    Hypertension Father    Heart disease Father    Asthma Maternal Grandmother    Heart disease Sister    Stroke Brother    Voice disorder Daughter        vocal dystonia   Stroke Daughter    Stroke Son    Cancer Sister 52    New complaints: None today  Social history: Lives by herself     Review of Systems  Constitutional: Negative for diaphoresis.  Eyes: Negative for pain.  Respiratory: Negative for shortness of breath.   Cardiovascular: Negative for chest pain, palpitations and leg swelling.  Gastrointestinal: Negative for abdominal pain.   Endocrine: Negative for polydipsia.  Musculoskeletal: Positive for back pain (intermittent).  Skin: Negative for rash.  Neurological: Negative for dizziness, weakness and headaches.  Hematological: Does not bruise/bleed easily.  All other systems reviewed and are negative.      Objective:   Physical Exam Vitals and nursing note reviewed.  Constitutional:      General: She is not in acute distress.    Appearance: Normal appearance. She is well-developed.  HENT:     Head: Normocephalic.     Nose: Nose normal.  Eyes:     Pupils: Pupils are equal, round, and reactive to light.  Neck:     Vascular: No carotid bruit or JVD.  Cardiovascular:     Rate and Rhythm: Normal rate and regular rhythm.     Heart sounds: Normal heart sounds.  Pulmonary:     Effort: Pulmonary effort is normal. No respiratory distress.     Breath sounds: Normal breath sounds. No wheezing or rales.  Chest:     Chest wall: No tenderness.  Abdominal:     General: Bowel sounds are normal. There is no distension or abdominal bruit.     Palpations: Abdomen is soft. There is no hepatomegaly, splenomegaly, mass or pulsatile mass.     Tenderness: There is no abdominal tenderness.  Musculoskeletal:        General: Normal range of motion.     Cervical back: Normal range of motion and neck supple.  Lymphadenopathy:     Cervical: No cervical adenopathy.  Skin:    General: Skin is warm and dry.  Neurological:     Mental Status: She is alert and oriented to person, place, and time.     Deep Tendon Reflexes: Reflexes are normal and symmetric.  Psychiatric:        Behavior: Behavior normal.        Thought Content: Thought content normal.        Judgment: Judgment normal.     BP 109/62    Pulse (!) 57    Temp 97.9 F (36.6 C) (Temporal)    Resp 20    Ht 5\' 2"  (1.575 m)    Wt 115 lb (52.2 kg)    SpO2 95%    BMI 21.03 kg/m        Assessment & Plan:  MARYBELL ROBARDS comes in today with chief complaint of  Medical Management of Chronic Issues   Diagnosis and orders addressed:  1. Essential hypertension Low sodium diet  2. Ascending aortic aneurysm (HCC) Will continue  To watch yearly  3. Paroxysmal atrial fibrillation (HCC) Avoid caffeine - diltiazem (CARDIZEM) 30 MG tablet; Take 1 tablet (30 mg total) by mouth every 6 (six) hours as needed (for rapid heart rate).  Dispense: 90 tablet; Refill: 1  4. Hypothyroidism, unspecified type Will repeat labs at next visit  5. Anxiety Stress management  6. Mixed hyperlipidemia Low fat diet  7. Cervical neuropathic pain  8. DDD (degenerative disc disease), cervical norco 5/325 1 po Q8 prn 90 day supply- 120.  9. 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

## 2019-12-27 ENCOUNTER — Telehealth: Payer: Self-pay | Admitting: Nurse Practitioner

## 2019-12-27 MED ORDER — HYDROCODONE-ACETAMINOPHEN 5-325 MG PO TABS: 1.0000 | ORAL_TABLET | Freq: Four times a day (QID) | ORAL | 0 refills | Status: DC | PRN

## 2019-12-27 MED ORDER — DRONEDARONE HCL 400 MG PO TABS: 400.0000 mg | ORAL_TABLET | Freq: Two times a day (BID) | ORAL | 3 refills | Status: DC

## 2020-01-02 ENCOUNTER — Other Ambulatory Visit: Payer: Self-pay

## 2020-01-02 ENCOUNTER — Ambulatory Visit: Payer: Medicare HMO

## 2020-01-02 DIAGNOSIS — Z23 Encounter for immunization: Secondary | ICD-10-CM

## 2020-01-02 NOTE — Progress Notes (Signed)
° °  Covid-19 Vaccination Clinic  Name:  FIDELA CIESLAK    MRN: [id number] DOB: 10-12-1940  01/02/2020  Ms. Glock was observed post Covid-19 immunization for 15 minutes without incident. She was provided with Vaccine Information Sheet and instruction to access the V-Safe system.   Ms. Hakeem was instructed to call 911 with any severe reactions post vaccine:  Difficulty breathing   Swelling of face and throat   A fast heartbeat   A bad rash all over body   Dizziness and weakness   Immunizations Administered    Name Date Dose VIS Date Route   Moderna COVID-19 Vaccine 01/02/2020  9:10 AM 0.5 mL 04/2019 Intramuscular   Manufacturer: Moderna   Lot: [id number]   NDC: 44514-604-79

## 2020-01-10 ENCOUNTER — Other Ambulatory Visit: Payer: Self-pay | Admitting: Nurse Practitioner

## 2020-01-10 DIAGNOSIS — I480 Paroxysmal atrial fibrillation: Secondary | ICD-10-CM

## 2020-01-18 ENCOUNTER — Telehealth: Payer: Self-pay | Admitting: Cardiology

## 2020-01-18 ENCOUNTER — Telehealth: Payer: Self-pay | Admitting: Nurse Practitioner

## 2020-01-18 NOTE — Telephone Encounter (Signed)
Patient contacted cardiology and they said to contact PCP because you filled last. Cardiologist originally prescribed first of August.

## 2020-01-18 NOTE — Telephone Encounter (Signed)
She needs to contact her cardiologist that she cannot tolerate

## 2020-01-18 NOTE — Telephone Encounter (Signed)
Patient aware and verbalized understanding. °

## 2020-01-18 NOTE — Telephone Encounter (Signed)
Pt c/o medication issue:  1. Name of Medication: dronedarone (MULTAQ) 400 MG tablet  2. How are you currently taking this medication (dosage and times per day)? 1 tablet by mouth two times daily   3. Are you having a reaction (difficulty breathing--STAT)? Yes   4. What is your medication issue? Evelyn [name] is calling stating she has tried this medication for a month and it is causing her SOB and nausea. She is wanting to know if she should discontinue it, make an appointment to be seen, or just be switched to an alternative. She states she is leaving for a Doctor appointment at 1:15 PM today and won't be back until around 3:30 PM - 4:00 PM. She is requesting she either be called before she leaves or when she gets back. Please advise.

## 2020-01-18 NOTE — Telephone Encounter (Signed)
Please see previous 01/18/20 telephone note. (unable to add on due to being signed)  Evelyn [name] is calling back stating she reached out to her PCP and checked with her pharmacy and they advised they do not prescribe this medication, but it was given by Cardiology. Please advise pt on what to do in regards to this medication.

## 2020-01-18 NOTE — Telephone Encounter (Signed)
Patient was started on Multaq in Oct 2020 and stopped it for similar symptoms such as nausea and SOB in January 2021. Pt recently restarted this medication in August of 2021. She now reports SOB and nausea again from taking the Multaq. Attempted to call the patient back to discuss symptoms further but she did not answer. Continuous ringing and unable to leave a message at this time.

## 2020-01-18 NOTE — Telephone Encounter (Signed)
Pt calling in concerning a reaction to Multaq. Advised the patient to reach out to her PCP who prescribed the medication. She verbalized understanding and thanked me for the call.

## 2020-01-18 NOTE — Telephone Encounter (Signed)
Pt states she cannot take the dronedarone (MULTAQ) 400 MG tablet  Due to its making her SOB worse and making her have nausea.

## 2020-01-18 NOTE — Telephone Encounter (Signed)
I did not prescribe multag- cardiology did. They need to decide what to do about her stoping it.

## 2020-01-22 NOTE — Telephone Encounter (Signed)
Patient is returning call.  °

## 2020-01-22 NOTE — Telephone Encounter (Signed)
Attempted to return pt call, no answer, no voicemail. unable to leave message.

## 2020-01-23 LAB — CUP PACEART REMOTE DEVICE CHECK
Date Time Interrogation Session: 20210905234029
Implantable Pulse Generator Implant Date: 20200129

## 2020-01-25 NOTE — Telephone Encounter (Signed)
Have been off the Multaq for a week now. Previous reported symptoms improved/gone since stopping medication. Pt scheduled for 10/5 to further discuss Tikosyn w/ physician and establish a plan until such time as pt can be admitted for medication initiation. She is agreeable to plan.

## 2020-01-28 ENCOUNTER — Ambulatory Visit (INDEPENDENT_AMBULATORY_CARE_PROVIDER_SITE_OTHER): Payer: Medicare HMO | Admitting: *Deleted

## 2020-01-28 DIAGNOSIS — G459 Transient cerebral ischemic attack, unspecified: Secondary | ICD-10-CM | POA: Diagnosis not present

## 2020-01-30 ENCOUNTER — Other Ambulatory Visit: Payer: Self-pay

## 2020-01-30 ENCOUNTER — Ambulatory Visit: Payer: Medicare HMO

## 2020-01-30 DIAGNOSIS — Z23 Encounter for immunization: Secondary | ICD-10-CM

## 2020-01-30 NOTE — Progress Notes (Signed)
   Covid-19 Vaccination Clinic  Name:  TERRA AVENI    MRN: [id number] DOB: 02-17-1941  01/30/2020  Ms. Escalona was observed post Covid-19 immunization for 15 minutes without incident. She was provided with Vaccine Information Sheet and instruction to access the V-Safe system.   Ms. Freehling was instructed to call 911 with any severe reactions post vaccine: [name] Difficulty breathing  . Swelling of face and throat  . A fast heartbeat  . A bad rash all over body  . Dizziness and weakness   Immunizations Administered    Name Date Dose VIS Date Route   Moderna COVID-19 Vaccine 01/30/2020  9:13 AM 0.5 mL 04/2019 Intramuscular   Manufacturer: Moderna   Lot: [id number]   NDC: 01561-537-94

## 2020-01-30 NOTE — Progress Notes (Signed)
Carelink Summary Report / Loop Recorder 

## 2020-02-19 ENCOUNTER — Ambulatory Visit: Payer: Medicare HMO | Admitting: Cardiology

## 2020-02-19 ENCOUNTER — Ambulatory Visit (INDEPENDENT_AMBULATORY_CARE_PROVIDER_SITE_OTHER): Payer: Medicare HMO | Admitting: Cardiology

## 2020-02-19 ENCOUNTER — Other Ambulatory Visit: Payer: Self-pay

## 2020-02-19 ENCOUNTER — Encounter: Payer: Self-pay | Admitting: Cardiology

## 2020-02-19 VITALS — BP 150/80 | HR 55 | Ht 62.0 in | Wt 117.8 lb

## 2020-02-19 DIAGNOSIS — I480 Paroxysmal atrial fibrillation: Secondary | ICD-10-CM | POA: Diagnosis not present

## 2020-02-19 NOTE — Progress Notes (Signed)
Electrophysiology Office Note   Date:  02/19/2020   ID:  Evelyn [name], DOB 05/27/1940, MRN [id number]  PCP:  Bennie Pierini, FNP  Cardiologist: Croitrou Primary Electrophysiologist:  Jesusa Stenerson Jorja Loa, MD    Chief Complaint: atrial fibrillation   History of Present Illness: Evelyn [name] is a 79 y.o. female who is being seen today for the evaluation of atrial fibrillation at the request of Dumais, Mary-Margaret, *. Presenting today for electrophysiology evaluation.  He has a history of atrial fibrillation/flutter, coronary artery disease, TIA hypertension, hyperlipidemia, aortic insufficiency, and ascending aortic aneurysm.  She has been having episodes of atrial fibrillation.  Her symptoms are palpitations, weakness, and fatigue.  Linq monitor shows an overall low burden.  She is currently on Multaq due to symptoms from her atrial fibrillation.  Today, denies symptoms of palpitations, chest pain,  orthopnea, PND, lower extremity edema, claudication, dizziness, presyncope, syncope, bleeding, or neurologic sequela. The patient is tolerating medications without difficulties.  She is unaware of any further episodes of atrial fibrillation.  Her only complaint today is of shortness of breath.  She can sleep lying flat and does not have PND.  She gets short of breath when she exerts herself occasionally, but also when she bends over to pick something up.   Past Medical History:  Diagnosis Date  . Aneurysm (HCC)   . Aortic insufficiency   . CAD (coronary artery disease)    CABG LIMA to LAD,vein graft to diagonal,vein graft to OM  . Dyslipidemia   . GERD (gastroesophageal reflux disease)   . Hypothyroid    Past Surgical History:  Procedure Laterality Date  . ABDOMINAL HYSTERECTOMY    . BACK SURGERY  2009  . CORONARY ANGIOPLASTY  2003   cutting balloon atherectomy in-stent stenosis of the LAD.  [name] CORONARY ANGIOPLASTY WITH STENT PLACEMENT  2002   remote PCI & stenting  LAD,  . CORONARY ARTERY BYPASS GRAFT  2004   LIMA to LAD,vein graft to diagonal,vein graft to OM  . FOOT SURGERY    . LEFT HEART CATH AND CORS/GRAFTS ANGIOGRAPHY N/A 02/23/2017   Procedure: LEFT HEART CATH AND CORS/GRAFTS ANGIOGRAPHY;  Surgeon: Iran Ouch, MD;  [facility];  Service: Cardiovascular;  Laterality: N/A;  . LEFT HEART CATHETERIZATION WITH CORONARY ANGIOGRAM N/A 12/15/2011   Procedure: LEFT HEART CATHETERIZATION WITH CORONARY ANGIOGRAM;  Surgeon: Thurmon Fair, MD;  Location: [facility];  Service: Cardiovascular;  Laterality: N/A;  . LOOP RECORDER INSERTION N/A 06/14/2018   Procedure: LOOP RECORDER INSERTION;  Surgeon: Thurmon Fair, MD;  [facility];  Service: Cardiovascular;  Laterality: N/A;  . NECK SURGERY    . NM MYOCAR PERF WALL MOTION  12/01/2011   normal study-persistent exercise induced ECG changes raises the concern for ischemia (balanced ischemia).  . NOSE SURGERY    . TONSILLECTOMY  1948  . TUBAL LIGATION       Current Outpatient Medications  Medication Sig Dispense Refill  . amoxicillin (AMOXIL) 500 MG capsule Take 2,000 mg by mouth as needed. Use as directed    . apixaban (ELIQUIS) 2.5 MG TABS tablet Take 1 tablet (2.5 mg total) by mouth 2 (two) times daily. 180 tablet 1  . diltiazem (CARDIZEM) 30 MG tablet Take 1 tablet (30 mg total) by mouth every 6 (six) hours as needed (for rapid heart rate). 90 tablet 1  . dronedarone (MULTAQ) 400 MG tablet Take 1 tablet (400 mg total) by mouth 2 (two) times  daily with a meal. 180 tablet 3  . furosemide (LASIX) 20 MG tablet Take 1 tablet (20 mg total) by mouth daily. 90 tablet 1  . HYDROcodone-acetaminophen (NORCO) 5-325 MG tablet Take 1 tablet by mouth every 6 (six) hours as needed for moderate pain. 120 tablet 0  . hydrocortisone 2.5 % cream APPLY TO AFFECTED AREA TWICE A DAY 28.35 g 5  . isosorbide mononitrate (IMDUR) 60 MG 24 hr tablet TAKE 1 TABLET (60 MG TOTAL) DAILY BY MOUTH.  90 tablet 1  . levothyroxine (SYNTHROID) 75 MCG tablet Take 1 tablet (75 mcg total) by mouth daily before breakfast. 30 tablet 6  . loratadine (CLARITIN) 10 MG tablet Take 1 tablet (10 mg total) by mouth daily. 90 tablet 3  . metoprolol tartrate (LOPRESSOR) 100 MG tablet Take 1 tablet (100 mg total) by mouth 2 (two) times daily. 180 tablet 1  . nitroGLYCERIN (NITROSTAT) 0.4 MG SL tablet PLACE 1 TABLET (0.4 MG TOTAL) UNDER THE TONGUE EVERY 5 (FIVE) MINUTES AS NEEDED FOR CHEST PAIN. 25 tablet 0  . rosuvastatin (CRESTOR) 10 MG tablet TAKE 1 TABLET BY MOUTH EVERY DAY IN THE EVENING 90 tablet 1  . rosuvastatin (CRESTOR) 20 MG tablet TAKE 1 TABLET BY MOUTH EVERY DAY IN THE EVENING 90 tablet 1  . Zoledronic Acid (RECLAST IV) Inject into the vein. Once yearly     No current facility-administered medications for this visit.    Allergies:   Sulfa antibiotics   Social History:  The patient  reports that she has never smoked. She has never used smokeless tobacco. She reports that she does not drink alcohol and does not use drugs.   Family History:  The patient's family history includes Asthma in her maternal grandmother; Cancer (age of onset: 8) in her sister; Heart attack in her mother; Heart disease in her father, mother, and sister; Heart failure in her father; Hypertension in her father; Stroke in her brother, daughter, and son; Voice disorder in her daughter.    ROS:  Please see the history of present illness.   Otherwise, review of systems is positive for none.   All other systems are reviewed and negative.   PHYSICAL EXAM: VS:  BP (!) 150/80   Pulse (!) 55   Ht 5\' 2"  (1.575 m)   Wt 117 lb 12.8 oz (53.4 kg)   SpO2 95%   BMI 21.55 kg/m  , BMI Body mass index is 21.55 kg/m. GEN: Well nourished, well developed, in no acute distress  HEENT: normal  Neck: no JVD, carotid bruits, or masses Cardiac: RRR; no murmurs, rubs, or gallops,no edema  Respiratory:  clear to auscultation bilaterally,  normal work of breathing GI: soft, nontender, nondistended, + BS MS: no deformity or atrophy  Skin: warm and dry, device site well healed Neuro:  Strength and sensation are intact Psych: euthymic mood, full affect  EKG:  EKG is not ordered today. Personal review of the ekg ordered 12/18/19 shows sinus rhythm, rate 56  Personal review of the device interrogation today. Results in [facility]   Recent Labs: 08/03/2019: ALT 12; Hemoglobin 14.4; Platelets 232 11/01/2019: TSH 0.337 11/09/2019: BUN 11; Creatinine, Ser 0.67; Potassium 4.3; Sodium 140    Lipid Panel     Component Value Date/Time   CHOL 137 08/03/2019 1152   TRIG 70 08/03/2019 1152   HDL 69 08/03/2019 1152   CHOLHDL 2.0 08/03/2019 1152   CHOLHDL 2.0 07/11/2014 0913   VLDL 12 07/11/2014 0913   LDLCALC 54  08/03/2019 1152     Wt Readings from Last 3 Encounters:  02/19/20 117 lb 12.8 oz (53.4 kg)  12/26/19 115 lb (52.2 kg)  12/18/19 116 lb (52.6 kg)      Other studies Reviewed: Additional studies/ records that were reviewed today include: TTE 2018  Review of the above records today demonstrates:  - Left ventricle: The cavity size was normal. Systolic function was   normal. The estimated ejection fraction was in the range of 55%   to 60%. Mild hypokinesis of the inferior and inferoseptal   myocardium. Doppler parameters are consistent with abnormal left   ventricular relaxation (grade 1 diastolic dysfunction). Doppler   parameters are consistent with indeterminate ventricular filling   pressure. - Aortic valve: Transvalvular velocity was within the normal range.   There was no stenosis. There was mild regurgitation. - Aorta: Ascending aortic diameter: 37 mm (S). - Ascending aorta: The ascending aorta was mildly dilated. - Mitral valve: Transvalvular velocity was within the normal range.   There was no evidence for stenosis. There was mild regurgitation. - Right ventricle: The cavity size was normal. Wall thickness  was   normal. Systolic function was normal. - Atrial septum: No defect or patent foramen ovale was identified   by color flow Doppler or saline microcavitation study. - Tricuspid valve: There was moderate regurgitation. - Pulmonary arteries: Systolic pressure was within the normal   range. PA peak pressure: 27 mm Hg (S).  LHC 2018  The left ventricular systolic function is normal.  LV end diastolic pressure is mildly elevated.  The left ventricular ejection fraction is 55-65% by visual estimate.  Ost LAD to Mid LAD lesion, 100 %stenosed.  Ost Cx lesion, 20 %stenosed.  LM lesion, 20 %stenosed.  Prox RCA lesion, 20 %stenosed.  SVG and is normal in caliber and anatomically normal.  LIMA and is normal in caliber and anatomically normal.  ASSESSMENT AND PLAN:  1.  Paroxysmal atrial fibrillation: Currently on Eliquis, metoprolol, as needed diltiazem.  CHA2DS2-VASc of 6.  She has not had much atrial fibrillation since last being seen.  She currently feels well.  She agrees that being off of antiarrhythmics is reasonable at this point.  2.  Coronary artery disease status post CABG: No current chest pain.  3.  Hyperlipidemia: Continue Crestor per primary cardiology.  4.  Shortness of breath: This point I do not feel that her shortness of breath is cardiac in nature.  Her ejection fraction in 2018 was normal with stable coronary artery disease.  She does not have JVD or lower extremity edema and her lungs are clear.  I have told her to discuss this further with her primary physician.  Current medicines are reviewed at length with the patient today.   The patient does not have concerns regarding her medicines.  The following changes were made today: None  Labs/ tests ordered today include:  No orders of the defined types were placed in this encounter.    Disposition:   FU with Vernon Ariel 6 months  Signed, Coden Franchi Jorja Loa, MD  02/19/2020 9:24 AM     [facility] [address] [place] [address] [phone] (office) [phone] (fax)

## 2020-02-23 LAB — CUP PACEART REMOTE DEVICE CHECK
Date Time Interrogation Session: 20211008234253
Implantable Pulse Generator Implant Date: 20200129

## 2020-02-26 ENCOUNTER — Ambulatory Visit (INDEPENDENT_AMBULATORY_CARE_PROVIDER_SITE_OTHER): Payer: Medicare HMO

## 2020-02-26 DIAGNOSIS — Z0000 Encounter for general adult medical examination without abnormal findings: Secondary | ICD-10-CM

## 2020-02-26 NOTE — Progress Notes (Signed)
MEDICARE ANNUAL WELLNESS VISIT  02/26/2020  Telephone Visit Disclaimer This Medicare AWV was conducted by telephone due to national recommendations for restrictions regarding the COVID-19 Pandemic (e.g. social distancing).  I verified, using two identifiers, that I am speaking with Evelyn [name] or their authorized healthcare agent. I discussed the limitations, risks, security, and privacy concerns of performing an evaluation and management service by telephone and the potential availability of an in-person appointment in the future. The patient expressed understanding and agreed to proceed.  Location of Patient: Home Location of Provider (nurse):  [facility]  Subjective:    Evelyn [name] is a 79 y.o. female patient of Bennie PieriniMartin, Mary-Margaret, FNP who had a Medicare Annual Wellness Visit today via telephone. Evelyn [name] is a very pleasant lady who lives locally here in [place]. She is a widow and normally lives alone but her son is staying with her temporarily. She also has another child. She is retired but works part Proofreadertime book keeping. Before she retired she worked full time for [employer]. She enjoys shopping, walking, and traveling. 1-2 times per week her and a neighbor go out and do something like shopping or eating. She walks as much as she can and stays very active. She can't do any heavy exercise due to her heart issues.   Patient Care Team: Bennie PieriniMartin, Mary-Margaret, FNP as PCP - General (Family Medicine) Thurmon Fairroitoru, Mihai, MD as PCP - Cardiology (Cardiology) Regan Lemmingamnitz, Will Banas, MD as PCP - Electrophysiology (Cardiology) Roma KayserNida, Gebreselassie W, MD as Consulting Physician (Endocrinology)  Advanced Directives 02/26/2020 01/18/2019 01/11/2018 02/23/2017 09/01/2016 12/15/2011  Does Patient Have a Medical Advance Directive? Yes Yes Yes No Yes Patient does not have advance directive  Type of Advance Directive Living will Living will Living will - Living will  -  Does patient want to make changes to medical advance directive? No - Patient declined No - Patient declined Yes (MAU/Ambulatory/Procedural Areas - Information given) - No - Patient declined -  Copy of Healthcare Power of Attorney in Chart? - - No - copy requested - - -  Would patient like information on creating a medical advance directive? - - - No - Patient declined - [facility]-    Hospital Utilization Over the Past 12 Months: # of hospitalizations or ER visits: 0 # of surgeries: 0  Review of Systems    Patient reports that her overall health is unchanged compared to last year.  History obtained from chart review  Patient Reported Readings (BP, Pulse, CBG, Weight, etc) none  Pain Assessment Pain : No/denies pain     Current Medications & Allergies (verified) Allergies as of 02/26/2020      Reactions   Sulfa Antibiotics Swelling      Medication List       Accurate as of February 26, 2020 10:47 AM. If you have any questions, ask your nurse or doctor.        STOP taking these medications   dronedarone 400 MG tablet Commonly known as: MULTAQ     TAKE these medications   amoxicillin 500 MG capsule Commonly known as: AMOXIL Take 2,000 mg by mouth as needed. Use as directed   apixaban 2.5 MG Tabs tablet Commonly known as: Eliquis Take 1 tablet (2.5 mg total) by mouth 2 (two) times daily.   diltiazem 30 MG tablet Commonly known as: Cardizem Take 1 tablet (30 mg total) by mouth every 6 (six) hours as needed (for rapid heart rate).  furosemide 20 MG tablet Commonly known as: LASIX Take 1 tablet (20 mg total) by mouth daily.   HYDROcodone-acetaminophen 5-325 MG tablet Commonly known as: Norco Take 1 tablet by mouth every 6 (six) hours as needed for moderate pain.   hydrocortisone 2.5 % cream APPLY TO AFFECTED AREA TWICE A DAY   isosorbide mononitrate 60 MG 24 hr tablet Commonly known as: IMDUR TAKE 1 TABLET (60 MG TOTAL) DAILY BY MOUTH.   levothyroxine 75 MCG  tablet Commonly known as: SYNTHROID Take 1 tablet (75 mcg total) by mouth daily before breakfast.   loratadine 10 MG tablet Commonly known as: CLARITIN Take 1 tablet (10 mg total) by mouth daily.   metoprolol tartrate 100 MG tablet Commonly known as: LOPRESSOR Take 1 tablet (100 mg total) by mouth 2 (two) times daily.   nitroGLYCERIN 0.4 MG SL tablet Commonly known as: NITROSTAT PLACE 1 TABLET (0.4 MG TOTAL) UNDER THE TONGUE EVERY 5 (FIVE) MINUTES AS NEEDED FOR CHEST PAIN.   RECLAST IV Inject into the vein. Once yearly   rosuvastatin 20 MG tablet Commonly known as: CRESTOR TAKE 1 TABLET BY MOUTH EVERY DAY IN THE EVENING   rosuvastatin 10 MG tablet Commonly known as: CRESTOR TAKE 1 TABLET BY MOUTH EVERY DAY IN THE EVENING       History (reviewed): Past Medical History:  Diagnosis Date  . Aneurysm (HCC)   . Aortic insufficiency   . Atrial fibrillation (HCC)   . CAD (coronary artery disease)    CABG LIMA to LAD,vein graft to diagonal,vein graft to OM  . Dyslipidemia   . GERD (gastroesophageal reflux disease)   . Hypothyroid    Past Surgical History:  Procedure Laterality Date  . ABDOMINAL HYSTERECTOMY    . BACK SURGERY  2009  . CORONARY ANGIOPLASTY  2003   cutting balloon atherectomy in-stent stenosis of the LAD.  [name] CORONARY ANGIOPLASTY WITH STENT PLACEMENT  2002   remote PCI & stenting LAD,  . CORONARY ARTERY BYPASS GRAFT  2004   LIMA to LAD,vein graft to diagonal,vein graft to OM  . FOOT SURGERY    . LEFT HEART CATH AND CORS/GRAFTS ANGIOGRAPHY N/A 02/23/2017   Procedure: LEFT HEART CATH AND CORS/GRAFTS ANGIOGRAPHY;  Surgeon: Iran Ouch, MD;  [facility];  Service: Cardiovascular;  Laterality: N/A;  . LEFT HEART CATHETERIZATION WITH CORONARY ANGIOGRAM N/A 12/15/2011   Procedure: LEFT HEART CATHETERIZATION WITH CORONARY ANGIOGRAM;  Surgeon: Thurmon Fair, MD;  [facility];  Service: Cardiovascular;  Laterality: N/A;  . LOOP  RECORDER INSERTION N/A 06/14/2018   Procedure: LOOP RECORDER INSERTION;  Surgeon: Thurmon Fair, MD;  [facility];  Service: Cardiovascular;  Laterality: N/A;  . NECK SURGERY    . NM MYOCAR PERF WALL MOTION  12/01/2011   normal study-persistent exercise induced ECG changes raises the concern for ischemia (balanced ischemia).  . NOSE SURGERY    . TONSILLECTOMY  1948  . TUBAL LIGATION     Family History  Problem Relation Age of Onset  . Heart attack Mother   . Heart failure Father   . Hypertension Father   . Heart disease Father   . Asthma Maternal Grandmother   . Heart disease Sister   . Stroke Brother   . Voice disorder Daughter        vocal dystonia  . Stroke Daughter   . Stroke Son   . Cancer Sister 25   Social History   Socioeconomic History  .  Marital status: Widowed    Spouse name: Not on file  . Number of children: 2  . Years of education: Associates  . Highest education level: Associate degree: academic program  Occupational History  . Occupation: Retired  Tobacco Use  . Smoking status: Never Smoker  . Smokeless tobacco: Never Used  Vaping Use  . Vaping Use: Never used  Substance and Sexual Activity  . Alcohol use: No  . Drug use: No  . Sexual activity: Not Currently  Other Topics Concern  . Not on file  Social History Narrative   Mother of 2 with 4 grandchildren   Right-handed   Caffeine: 8-10 cups coffee   Social Determinants of Health   Financial Resource Strain:   . Difficulty of Paying Living Expenses: Not on file  Food Insecurity:   . Worried About Programme researcher, broadcasting/film/video in the Last Year: Not on file  . Ran Out of Food in the Last Year: Not on file  Transportation Needs:   . Lack of Transportation (Medical): Not on file  . Lack of Transportation (Non-Medical): Not on file  Physical Activity:   . Days of Exercise per Week: Not on file  . Minutes of Exercise per Session: Not on file  Stress:   . Feeling of Stress : Not on file    Social Connections:   . Frequency of Communication with Friends and Family: Not on file  . Frequency of Social Gatherings with Friends and Family: Not on file  . Attends Religious Services: Not on file  . Active Member of Clubs or Organizations: Not on file  . Attends Banker Meetings: Not on file  . Marital Status: Not on file    Activities of Daily Living In your present state of health, do you have any difficulty performing the following activities: 02/26/2020  Hearing? N  Vision? N  Difficulty concentrating or making decisions? N  Walking or climbing stairs? N  Dressing or bathing? N  Doing errands, shopping? N  Preparing Food and eating ? N  Using the Toilet? N  In the past six months, have you accidently leaked urine? N  Do you have problems with loss of bowel control? N  Managing your Medications? N  Managing your Finances? N  Housekeeping or managing your Housekeeping? N  Some recent data might be hidden    Patient Education/ Literacy How often do you need to have someone help you when you read instructions, pamphlets, or other written materials from your doctor or pharmacy?: 1 - Never What is the last grade level you completed in school?: 12th grade  Exercise Current Exercise Habits: The patient does not participate in regular exercise at present, Exercise limited by: cardiac condition(s)  Diet Patient reports consuming 3 meals a day and 2 snack(s) a day Patient reports that her primary diet is: Regular Patient reports that she does have regular access to food.   Depression Screen PHQ 2/9 Scores 12/26/2019 09/24/2019 08/03/2019 06/19/2019 01/29/2019 01/18/2019 06/19/2018  PHQ - 2 Score 0 0 0 0 0 0 0  PHQ- 9 Score - - - 0 - - -     Fall Risk Fall Risk  02/26/2020 12/26/2019 09/24/2019 08/03/2019 06/19/2019  Falls in the past year? 0 0 0 0 0  Number falls in past yr: - - - - -  Injury with Fall? - - - - -  Follow up - - - - -     Objective:  Willodean Rosenthal  Evelyn [name] seemed alert and oriented and she participated appropriately during our telephone visit.  Blood Pressure Weight BMI  BP Readings from Last 3 Encounters:  02/19/20 (!) 150/80  12/26/19 109/62  12/18/19 (!) 120/54   Wt Readings from Last 3 Encounters:  02/19/20 117 lb 12.8 oz (53.4 kg)  12/26/19 115 lb (52.2 kg)  12/18/19 116 lb (52.6 kg)   BMI Readings from Last 1 Encounters:  02/19/20 21.55 kg/m    *Unable to obtain current vital signs, weight, and BMI due to telephone visit type  Hearing/Vision  . Evelyn [name] did not seem to have difficulty with hearing/understanding during the telephone conversation . Reports that she has had a formal eye exam by an eye care professional within the past year . Reports that she has not had a formal hearing evaluation within the past year *Unable to fully assess hearing and vision during telephone visit type  Cognitive Function: 6CIT Screen 02/26/2020 01/18/2019  What Year? 0 points 0 points  What month? 0 points 0 points  What time? 0 points 0 points  Count back from 20 0 points 0 points  Months in reverse 0 points 0 points  Repeat phrase 0 points -  Total Score 0 -   (Normal:0-7, Significant for Dysfunction: >8)  Normal Cognitive Function Screening: Yes   Immunization & Health Maintenance Record Immunization History  Administered Date(s) Administered  . Influenza, High Dose Seasonal PF 03/09/2016, 03/02/2017  . Moderna SARS-COVID-2 Vaccination 01/02/2020, 01/30/2020  . Pneumococcal Conjugate-13 01/11/2018    Health Maintenance  Topic Date Due  . INFLUENZA VACCINE  12/16/2019  . PNA vac Low Risk Adult (2 of 2 - PPSV23) 08/02/2020 (Originally 01/12/2019)  . TETANUS/TDAP  01/12/2023  . DEXA SCAN  Completed  . COVID-19 Vaccine  Completed       Assessment  This is a routine wellness examination for Evelyn [name].  Health Maintenance: Due or Overdue Health Maintenance Due  Topic Date Due  . INFLUENZA VACCINE  12/16/2019     Evelyn [name] does not need a referral for Community Assistance: Care Management:   no Social Work:    no Prescription Assistance:  no Nutrition/Diabetes Education:  no   Plan:  Personalized Goals Goals Addressed   None    Personalized Health Maintenance & Screening Recommendations  Influenza vaccine  Lung Cancer Screening Recommended: no (Low Dose CT Chest recommended if Age 72-80 years, 30 pack-year currently smoking OR have quit w/in past 15 years) Hepatitis C Screening recommended: no HIV Screening recommended: no  Advanced Directives: Written information was not prepared per patient's request.  Referrals & Orders No orders of the defined types were placed in this encounter.   Follow-up Plan . Follow-up with Bennie Pierini, FNP as planned  Flu shot scheduled for this week    I have personally reviewed and noted the following in the patient's chart:   . Medical and social history . Use of alcohol, tobacco or illicit drugs  . Current medications and supplements . Functional ability and status . Nutritional status . Physical activity . Advanced directives . List of other physicians . Hospitalizations, surgeries, and ER visits in previous 12 months . Vitals . Screenings to include cognitive, depression, and falls . Referrals and appointments  In addition, I have reviewed and discussed with Evelyn [name] certain preventive protocols, quality metrics, and best practice recommendations. A written personalized care plan for preventive services as well as general preventive health recommendations is available and can be mailed  to the patient at her request.      Cleda Daub LPN [id number]

## 2020-02-26 NOTE — Patient Instructions (Signed)
°  Evelyn [name] , Thank you for taking time to come for your Medicare Wellness Visit. I appreciate your ongoing commitment to your health goals. Please review the following plan we discussed and let me know if I can assist you in the future.   These are the goals we discussed: Goals      DIET - EAT MORE FRUITS AND VEGETABLES (pt-stated)      Cut out snacking on salty foods and try more healthy options.      Exercise 150 min/wk Moderate Activity       This is a list of the screening recommended for you and due dates:  Health Maintenance  Topic Date Due   Flu Shot  12/16/2019   Pneumonia vaccines (2 of 2 - PPSV23) 08/02/2020*   Tetanus Vaccine  01/12/2023   DEXA scan (bone density measurement)  Completed   COVID-19 Vaccine  Completed  *Topic was postponed. The date shown is not the original due date.

## 2020-02-29 ENCOUNTER — Ambulatory Visit (INDEPENDENT_AMBULATORY_CARE_PROVIDER_SITE_OTHER): Payer: Medicare HMO

## 2020-02-29 ENCOUNTER — Telehealth: Payer: Self-pay | Admitting: Cardiovascular Disease

## 2020-02-29 ENCOUNTER — Other Ambulatory Visit: Payer: Self-pay

## 2020-02-29 DIAGNOSIS — I480 Paroxysmal atrial fibrillation: Secondary | ICD-10-CM

## 2020-02-29 DIAGNOSIS — Z23 Encounter for immunization: Secondary | ICD-10-CM | POA: Diagnosis not present

## 2020-02-29 MED ORDER — METOPROLOL TARTRATE 100 MG PO TABS: 100.0000 mg | ORAL_TABLET | Freq: Two times a day (BID) | ORAL | 1 refills | Status: DC

## 2020-02-29 NOTE — Telephone Encounter (Signed)
Pt c/o medication issue:  1. Name of Medication: metoprolol tartrate (LOPRESSOR) 100 MG tablet  2. How are you currently taking this medication (dosage and times per day)? As written   3. Are you having a reaction (difficulty breathing--STAT)? No  4. What is your medication issue? Pt needs a this prescription filled by Dr. Royann Shivers

## 2020-02-29 NOTE — Telephone Encounter (Signed)
Spoke with pt, aware refill sent to the pharmacy electronically.

## 2020-03-03 ENCOUNTER — Ambulatory Visit (INDEPENDENT_AMBULATORY_CARE_PROVIDER_SITE_OTHER): Payer: Medicare HMO

## 2020-03-03 DIAGNOSIS — G459 Transient cerebral ischemic attack, unspecified: Secondary | ICD-10-CM | POA: Diagnosis not present

## 2020-03-07 NOTE — Progress Notes (Signed)
Carelink Summary Report / Loop Recorder 

## 2020-03-11 ENCOUNTER — Ambulatory Visit: Payer: Medicare HMO | Admitting: Cardiology

## 2020-03-13 ENCOUNTER — Encounter (HOSPITAL_COMMUNITY): Payer: Medicare HMO

## 2020-03-25 ENCOUNTER — Ambulatory Visit: Payer: Medicare HMO | Admitting: Nurse Practitioner

## 2020-03-25 ENCOUNTER — Encounter: Payer: Self-pay | Admitting: Nurse Practitioner

## 2020-03-25 ENCOUNTER — Other Ambulatory Visit: Payer: Self-pay

## 2020-03-25 VITALS — BP 126/69 | HR 75 | Temp 98.1°F | Resp 20 | Ht 62.0 in | Wt 118.0 lb

## 2020-03-25 DIAGNOSIS — I480 Paroxysmal atrial fibrillation: Secondary | ICD-10-CM

## 2020-03-25 DIAGNOSIS — I7121 Aneurysm of the ascending aorta, without rupture: Secondary | ICD-10-CM

## 2020-03-25 DIAGNOSIS — I712 Thoracic aortic aneurysm, without rupture: Secondary | ICD-10-CM

## 2020-03-25 DIAGNOSIS — M5030 Other cervical disc degeneration, unspecified cervical region: Secondary | ICD-10-CM

## 2020-03-25 DIAGNOSIS — I10 Essential (primary) hypertension: Secondary | ICD-10-CM | POA: Diagnosis not present

## 2020-03-25 DIAGNOSIS — G459 Transient cerebral ischemic attack, unspecified: Secondary | ICD-10-CM

## 2020-03-25 DIAGNOSIS — E782 Mixed hyperlipidemia: Secondary | ICD-10-CM

## 2020-03-25 DIAGNOSIS — I25728 Atherosclerosis of autologous artery coronary artery bypass graft(s) with other forms of angina pectoris: Secondary | ICD-10-CM

## 2020-03-25 DIAGNOSIS — E039 Hypothyroidism, unspecified: Secondary | ICD-10-CM

## 2020-03-25 DIAGNOSIS — I25718 Atherosclerosis of autologous vein coronary artery bypass graft(s) with other forms of angina pectoris: Secondary | ICD-10-CM

## 2020-03-25 LAB — LIPID PANEL

## 2020-03-25 MED ORDER — ISOSORBIDE MONONITRATE ER 60 MG PO TB24: ORAL_TABLET | ORAL | 1 refills | Status: DC

## 2020-03-25 MED ORDER — ROSUVASTATIN CALCIUM 10 MG PO TABS: ORAL_TABLET | ORAL | 1 refills | Status: DC

## 2020-03-25 MED ORDER — APIXABAN 2.5 MG PO TABS: 2.5000 mg | ORAL_TABLET | Freq: Two times a day (BID) | ORAL | 1 refills | Status: DC

## 2020-03-25 MED ORDER — LEVOTHYROXINE SODIUM 75 MCG PO TABS: 75.0000 ug | ORAL_TABLET | Freq: Every day | ORAL | 1 refills | Status: DC

## 2020-03-25 MED ORDER — DILTIAZEM HCL 30 MG PO TABS: 30.0000 mg | ORAL_TABLET | Freq: Four times a day (QID) | ORAL | 1 refills | Status: DC | PRN

## 2020-03-25 MED ORDER — FUROSEMIDE 20 MG PO TABS: 20.0000 mg | ORAL_TABLET | Freq: Every day | ORAL | 1 refills | Status: DC

## 2020-03-25 MED ORDER — ROSUVASTATIN CALCIUM 20 MG PO TABS: ORAL_TABLET | ORAL | 1 refills | Status: DC

## 2020-03-25 MED ORDER — METOPROLOL TARTRATE 100 MG PO TABS: 100.0000 mg | ORAL_TABLET | Freq: Two times a day (BID) | ORAL | 1 refills | Status: DC

## 2020-03-25 MED ORDER — HYDROCODONE-ACETAMINOPHEN 5-325 MG PO TABS
1.0000 | ORAL_TABLET | Freq: Four times a day (QID) | ORAL | 0 refills | Status: DC | PRN
Start: 2020-03-25 — End: 2020-03-27

## 2020-03-25 NOTE — Addendum Note (Signed)
Addended by: Bennie Pierini on: 03/25/2020 10:57 AM   Modules accepted: Orders

## 2020-03-25 NOTE — Progress Notes (Signed)
Subjective:    Patient ID: Evelyn [name], female    DOB: Jan 13, 1941, 79 y.o.   MRN: [id number]   Chief Complaint: Medical Management of Chronic Issues    HPI:  1. Essential hypertension No c/o chest pain, sob or headache. Does no check her blood presure at home. BP Readings from Last 3 Encounters:  03/25/20 126/69  02/19/20 (!) 150/80  12/26/19 109/62     2. Ascending aortic aneurysm [facility]) Had CT angiogram in July which showed no change in arota sizw.   3. 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 Had her pacemaker checked and ws good. According to office note , there was no change made to plan of care.  4. Paroxysmal atrial fibrillation (HCC) She denies any palpitation or heart racing. She is on diltiazem and eliquis witout any issues  5. Mixed hyperlipidemia Does try to watch diet and stays very active. Lab Results  Component Value Date   CHOL 137 08/03/2019   HDL 69 08/03/2019   LDLCALC 54 08/03/2019   TRIG 70 08/03/2019   CHOLHDL 2.0 08/03/2019     6. Acquired hypothyroidism No problems that she is aware of. Lab Results  Component Value Date   TSH 0.337 (L) 11/01/2019     7. DDD (degenerative disc disease), cervical Pain assessment: Cause of pain- *DDD** Pain location- low back Pain on scale of 1-10- 5/10 Frequency- usually worse in mornings.  What increases pain-to much activity What makes pain Better-rest and pain meds Effects on ADL - none Any change in general medical condition-none  Current opioids rx- hydrocodone- doe snot take everyday only as needed # meds rx- 120- lasts her 3-4 months Effectiveness of current meds-helps Adverse reactions from pain meds-none Morphine equivalent- *  Pill count performed-No Last drug screen - 10/13/18 ( high risk q44m, moderate risk q5m, low risk yearly ) Urine drug screen today- Yes Was the [facility]  reviewed- yes  If yes were their any concerning findings? - no   Overdose risk: 1   Pain contract signed on: 06/19/19     Outpatient Encounter Medications as of 03/25/2020  Medication Sig  . apixaban (ELIQUIS) 2.5 MG TABS tablet Take 1 tablet (2.5 mg total) by mouth 2 (two) times daily.  [name] diltiazem (CARDIZEM) 30 MG tablet Take 1 tablet (30 mg total) by mouth every 6 (six) hours as needed (for rapid heart rate).  . furosemide (LASIX) 20 MG tablet Take 1 tablet (20 mg total) by mouth daily.  [name] HYDROcodone-acetaminophen (NORCO) 5-325 MG tablet Take 1 tablet by mouth every 6 (six) hours as needed for moderate pain.  . hydrocortisone 2.5 % cream APPLY TO AFFECTED AREA TWICE A DAY  . isosorbide mononitrate (IMDUR) 60 MG 24 hr tablet TAKE 1 TABLET (60 MG TOTAL) DAILY BY MOUTH.  [name] levothyroxine (SYNTHROID) 75 MCG tablet Take 1 tablet (75 mcg total) by mouth daily before breakfast.  . loratadine (CLARITIN) 10 MG tablet Take 1 tablet (10 mg total) by mouth daily.  . metoprolol tartrate (LOPRESSOR) 100 MG tablet Take 1 tablet (100 mg total) by mouth 2 (two) times daily.  . nitroGLYCERIN (NITROSTAT) 0.4 MG SL tablet PLACE 1 TABLET (0.4 MG TOTAL) UNDER THE TONGUE EVERY 5 (FIVE) MINUTES AS NEEDED FOR CHEST PAIN.  . rosuvastatin (CRESTOR) 10 MG tablet TAKE 1 TABLET BY MOUTH EVERY DAY IN THE EVENING  . rosuvastatin (CRESTOR) 20 MG tablet TAKE 1 TABLET  BY MOUTH EVERY DAY IN THE EVENING  . Zoledronic Acid (RECLAST IV) Inject into the vein. Once yearly    Past Surgical History:  Procedure Laterality Date  . ABDOMINAL HYSTERECTOMY    . BACK SURGERY  2009  . CORONARY ANGIOPLASTY  2003   cutting balloon atherectomy in-stent stenosis of the LAD.  [name] CORONARY ANGIOPLASTY WITH STENT PLACEMENT  2002   remote PCI & stenting LAD,  . CORONARY ARTERY BYPASS GRAFT  2004   LIMA to LAD,vein graft to diagonal,vein graft to OM  . FOOT SURGERY    . LEFT HEART CATH AND CORS/GRAFTS ANGIOGRAPHY N/A 02/23/2017    Procedure: LEFT HEART CATH AND CORS/GRAFTS ANGIOGRAPHY;  Surgeon: Iran Ouch, MD;  Location: [facility]VASIVE [facility];  Service: Cardiovascular;  Laterality: N/A;  . LEFT HEART CATHETERIZATION WITH CORONARY ANGIOGRAM N/A 12/15/2011   Procedure: LEFT HEART CATHETERIZATION WITH CORONARY ANGIOGRAM;  Surgeon: Thurmon Fair, MD;  Location: [facility] CATH LAB;  Service: Cardiovascular;  Laterality: N/A;  . LOOP RECORDER INSERTION N/A 06/14/2018   Procedure: LOOP RECORDER INSERTION;  Surgeon: Thurmon Fair, MD;  Location: [facility] CV [facility];  Service: Cardiovascular;  Laterality: N/A;  . NECK SURGERY    . NM MYOCAR PERF WALL MOTION  12/01/2011   normal study-persistent exercise induced ECG changes raises the concern for ischemia (balanced ischemia).  . NOSE SURGERY    . TONSILLECTOMY  1948  . TUBAL LIGATION      Family History  Problem Relation Age of Onset  . Heart attack Mother   . Heart failure Father   . Hypertension Father   . Heart disease Father   . Asthma Maternal Grandmother   . Heart disease Sister   . Stroke Brother   . Voice disorder Daughter        vocal dystonia  . Stroke Daughter   . Stroke Son   . Cancer Sister 27    New complaints: None today  Social history: Lives by herself  Controlled substance contract: n/a   Review of Systems  Constitutional: Negative for diaphoresis.  Eyes: Negative for pain.  Respiratory: Negative for shortness of breath.   Cardiovascular: Negative for chest pain, palpitations and leg swelling.  Gastrointestinal: Negative for abdominal pain.  Endocrine: Negative for polydipsia.  Skin: Negative for rash.  Neurological: Negative for dizziness, weakness and headaches.  Hematological: Does not bruise/bleed easily.  All other systems reviewed and are negative.      Objective:   Physical Exam Vitals and nursing note reviewed.  Constitutional:      General: She is not in acute distress.    Appearance: Normal appearance. She is  well-developed.  HENT:     Head: Normocephalic.     Nose: Nose normal.  Eyes:     Pupils: Pupils are equal, round, and reactive to light.  Neck:     Vascular: No carotid bruit or JVD.  Cardiovascular:     Rate and Rhythm: Normal rate and regular rhythm.     Heart sounds: Normal heart sounds.  Pulmonary:     Effort: Pulmonary effort is normal. No respiratory distress.     Breath sounds: Normal breath sounds. No wheezing or rales.  Chest:     Chest wall: No tenderness.  Abdominal:     General: Bowel sounds are normal. There is no distension or abdominal bruit.     Palpations: Abdomen is soft. There is no hepatomegaly, splenomegaly, mass or pulsatile mass.     Tenderness: There is  no abdominal tenderness.  Musculoskeletal:        General: Normal range of motion.     Cervical back: Normal range of motion and neck supple.  Lymphadenopathy:     Cervical: No cervical adenopathy.  Skin:    General: Skin is warm and dry.  Neurological:     Mental Status: She is alert and oriented to person, place, and time.     Deep Tendon Reflexes: Reflexes are normal and symmetric.  Psychiatric:        Behavior: Behavior normal.        Thought Content: Thought content normal.        Judgment: Judgment normal.    BP 126/69   Pulse 75   Temp 98.1 F (36.7 C) (Temporal)   Resp 20   Ht 5\' 2"  (1.575 m)   Wt 118 lb (53.5 kg)   SpO2 96%   BMI 21.58 kg/m         Assessment & Plan:  OSIE MERKIN comes in today with chief complaint of Medical Management of Chronic Issues   Diagnosis and orders addressed:  1. Essential hypertension Low sodium diet - furosemide (LASIX) 20 MG tablet; Take 1 tablet (20 mg total) by mouth daily.  Dispense: 90 tablet; Refill: 1  2. Ascending aortic aneurysm (HCC)  3. 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 Paroxysmal atrial fibrillation  (HCC) Avoid caffeine - metoprolol tartrate (LOPRESSOR) 100 MG tablet; Take 1 tablet (100 mg total) by mouth 2 (two) times daily.  Dispense: 180 tablet; Refill: 1 - diltiazem (CARDIZEM) 30 MG tablet; Take 1 tablet (30 mg total) by mouth every 6 (six) hours as needed (for rapid heart rate).  Dispense: 90 tablet; Refill: 1 - apixaban (ELIQUIS) 2.5 MG TABS tablet; Take 1 tablet (2.5 mg total) by mouth 2 (two) times daily.  Dispense: 180 tablet; Refill: 1  5. Mixed hyperlipidemia Low fat diet - rosuvastatin (CRESTOR) 10 MG tablet; TAKE 1 TABLET BY MOUTH EVERY DAY IN THE EVENING  Dispense: 90 tablet; Refill: 1 - rosuvastatin (CRESTOR) 20 MG tablet; TAKE 1 TABLET BY MOUTH EVERY DAY IN THE EVENING  Dispense: 90 tablet; Refill: 1  6. Acquired hypothyroidism Labs pending - levothyroxine (SYNTHROID) 75 MCG tablet; Take 1 tablet (75 mcg total) by mouth daily before breakfast.  Dispense: 90 tablet; Refill: 1 7. DDD (degenerative disc disease), cervical - HYDROcodone-acetaminophen (NORCO) 5-325 MG tablet; Take 1 tablet by mouth every 6 (six) hours as needed for moderate pain.  Dispense: 120 tablet; Refill: 0  8. 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  Dispense: 90 tablet; Refill: 1  9. Transient cerebral ischemia, unspecified type Low fat diet - rosuvastatin (CRESTOR) 20 MG tablet; TAKE 1 TABLET BY MOUTH EVERY DAY IN THE EVENING  Dispense: 90 tablet; Refill: 1   Labs pending Health Maintenance reviewed Diet and exercise encouraged  Follow up plan: 3 months   [facility], FNP

## 2020-03-25 NOTE — Patient Instructions (Signed)

## 2020-03-26 ENCOUNTER — Telehealth: Payer: Self-pay

## 2020-03-26 LAB — CBC WITH DIFFERENTIAL/PLATELET
Basophils Absolute: 0 10*3/uL (ref 0.0–0.2)
Basos: 1 %
EOS (ABSOLUTE): 0.1 10*3/uL (ref 0.0–0.4)
Eos: 1 %
Hematocrit: 39.9 % (ref 34.0–46.6)
Hemoglobin: 13.3 g/dL (ref 11.1–15.9)
Immature Grans (Abs): 0 10*3/uL (ref 0.0–0.1)
Immature Granulocytes: 0 %
Lymphocytes Absolute: 3.8 10*3/uL — ABNORMAL HIGH (ref 0.7–3.1)
Lymphs: 51 %
MCH: 30.9 pg (ref 26.6–33.0)
MCHC: 33.3 g/dL (ref 31.5–35.7)
MCV: 93 fL (ref 79–97)
Monocytes Absolute: 0.8 10*3/uL (ref 0.1–0.9)
Monocytes: 10 %
Neutrophils Absolute: 2.7 10*3/uL (ref 1.4–7.0)
Neutrophils: 37 %
Platelets: 215 10*3/uL (ref 150–450)
RBC: 4.3 x10E6/uL (ref 3.77–5.28)
RDW: 13 % (ref 11.7–15.4)
WBC: 7.4 10*3/uL (ref 3.4–10.8)

## 2020-03-26 LAB — LIPID PANEL
Chol/HDL Ratio: 2.7 ratio (ref 0.0–4.4)
Cholesterol, Total: 170 mg/dL (ref 100–199)
HDL: 63 mg/dL (ref >39)
LDL Chol Calc (NIH): 95 mg/dL (ref 0–99)
Triglycerides: 64 mg/dL (ref 0–149)
VLDL Cholesterol Cal: 12 mg/dL (ref 5–40)

## 2020-03-26 LAB — CMP14+EGFR
ALT: 7 IU/L (ref 0–32)
AST: 14 IU/L (ref 0–40)
Albumin/Globulin Ratio: 1.5 (ref 1.2–2.2)
Albumin: 4 g/dL (ref 3.6–4.6)
Alkaline Phosphatase: 63 IU/L (ref 44–121)
BUN/Creatinine Ratio: 15 (ref 12–28)
BUN: 9 mg/dL (ref 8–27)
Bilirubin Total: 0.4 mg/dL (ref 0.0–1.2)
CO2: 23 mmol/L (ref 20–29)
Calcium: 9.4 mg/dL (ref 8.7–10.3)
Chloride: 104 mmol/L (ref 96–106)
Creatinine, Ser: 0.59 mg/dL (ref 0.57–1.00)
GFR calc Af Amer: 99 mL/min/{1.73_m2} (ref >59)
GFR calc non Af Amer: 86 mL/min/{1.73_m2} (ref >59)
Globulin, Total: 2.7 g/dL (ref 1.5–4.5)
Glucose: 82 mg/dL (ref 65–99)
Potassium: 3.8 mmol/L (ref 3.5–5.2)
Sodium: 139 mmol/L (ref 134–144)
Total Protein: 6.7 g/dL (ref 6.0–8.5)

## 2020-03-26 LAB — THYROID PANEL WITH TSH
Free Thyroxine Index: 2.6 (ref 1.2–4.9)
T3 Uptake Ratio: 27 % (ref 24–39)
T4, Total: 9.6 ug/dL (ref 4.5–12.0)
TSH: 0.31 u[IU]/mL — ABNORMAL LOW (ref 0.450–4.500)

## 2020-03-26 NOTE — Telephone Encounter (Signed)
[facility] alert received- Alert tachy event and 1 AF, available EGM appears AF w/ RVR 150-180's > 2 mins.   Pt has known PAF, on Eliquis for OAC.  Meds include PRN dilstiazem 30 mg Q 6hrs for rapid HR.   Spoke with pt, she was aware of episode last night.  She reports increased stress due to the passing of her brother  She did take er PRN Diltizaem as well as NTG last night with good effect.  Today she feels well.

## 2020-03-27 ENCOUNTER — Encounter (HOSPITAL_COMMUNITY)
Admission: RE | Admit: 2020-03-27 | Discharge: 2020-03-27 | Disposition: A | Discharge status: Home / Self Care | Payer: Medicare HMO | Source: Ambulatory Visit | Attending: Allergy | Admitting: Allergy

## 2020-03-27 ENCOUNTER — Telehealth: Payer: Self-pay | Admitting: Nurse Practitioner

## 2020-03-27 DIAGNOSIS — M5030 Other cervical disc degeneration, unspecified cervical region: Secondary | ICD-10-CM

## 2020-03-27 MED ORDER — HYDROCODONE-ACETAMINOPHEN 5-325 MG PO TABS: 1.0000 | ORAL_TABLET | Freq: Four times a day (QID) | ORAL | 0 refills | Status: DC | PRN

## 2020-03-27 NOTE — Telephone Encounter (Signed)
Pt stated that she would like her hydrocodone sent to [facility] instead of [facility] in [place]

## 2020-03-27 NOTE — Telephone Encounter (Signed)
Please advise on pharmacy change, Rx sent on 03/25/20

## 2020-03-27 NOTE — OR Nursing (Signed)
Patient called to cancel todays appointment due to death in the family. Clinic nurse Tomi notified. Patient said she will recshedule.

## 2020-03-28 LAB — TOXASSURE SELECT 13 (MW), URINE

## 2020-04-07 ENCOUNTER — Ambulatory Visit (INDEPENDENT_AMBULATORY_CARE_PROVIDER_SITE_OTHER): Payer: Medicare HMO

## 2020-04-07 DIAGNOSIS — G459 Transient cerebral ischemic attack, unspecified: Secondary | ICD-10-CM | POA: Diagnosis not present

## 2020-04-07 LAB — CUP PACEART REMOTE DEVICE CHECK
Date Time Interrogation Session: 20211122001350
Implantable Pulse Generator Implant Date: 20200129

## 2020-04-09 NOTE — Progress Notes (Signed)
Carelink Summary Report / Loop Recorder 

## 2020-04-28 ENCOUNTER — Other Ambulatory Visit: Payer: Self-pay

## 2020-04-28 ENCOUNTER — Encounter (HOSPITAL_COMMUNITY): Payer: Self-pay

## 2020-04-28 ENCOUNTER — Encounter (HOSPITAL_COMMUNITY)
Admission: RE | Admit: 2020-04-28 | Discharge: 2020-04-28 | Disposition: A | Discharge status: Home / Self Care | Payer: Medicare HMO | Source: Ambulatory Visit | Attending: Nurse Practitioner | Admitting: Nurse Practitioner

## 2020-04-28 DIAGNOSIS — M810 Age-related osteoporosis without current pathological fracture: Secondary | ICD-10-CM | POA: Insufficient documentation

## 2020-04-28 MED ORDER — SODIUM CHLORIDE 0.9 % IV SOLN: Freq: Once | INTRAVENOUS | Status: AC

## 2020-04-28 MED ORDER — ZOLEDRONIC ACID 5 MG/100ML IV SOLN
5.0000 mg | Freq: Once | INTRAVENOUS | Status: AC
  Administered 2020-04-28: 5 mg via INTRAVENOUS

## 2020-05-05 ENCOUNTER — Encounter (HOSPITAL_COMMUNITY): Admission: RE | Admit: 2020-05-05 | Payer: Medicare HMO | Source: Ambulatory Visit

## 2020-05-07 ENCOUNTER — Other Ambulatory Visit: Payer: Self-pay | Admitting: Cardiovascular Disease

## 2020-05-12 ENCOUNTER — Ambulatory Visit (INDEPENDENT_AMBULATORY_CARE_PROVIDER_SITE_OTHER): Payer: Medicare HMO

## 2020-05-12 DIAGNOSIS — G459 Transient cerebral ischemic attack, unspecified: Secondary | ICD-10-CM | POA: Diagnosis not present

## 2020-05-12 LAB — CUP PACEART REMOTE DEVICE CHECK
Date Time Interrogation Session: 20211225001333
Implantable Pulse Generator Implant Date: 20200129

## 2020-05-26 NOTE — Progress Notes (Signed)
Carelink Summary Report / Loop Recorder 

## 2020-06-14 LAB — CUP PACEART REMOTE DEVICE CHECK
Date Time Interrogation Session: 20220127002824
Implantable Pulse Generator Implant Date: 20200129

## 2020-06-16 ENCOUNTER — Ambulatory Visit (INDEPENDENT_AMBULATORY_CARE_PROVIDER_SITE_OTHER): Payer: Medicare HMO

## 2020-06-16 DIAGNOSIS — G459 Transient cerebral ischemic attack, unspecified: Secondary | ICD-10-CM

## 2020-06-19 ENCOUNTER — Other Ambulatory Visit: Payer: Self-pay

## 2020-06-19 ENCOUNTER — Encounter: Payer: Self-pay | Admitting: Cardiovascular Disease

## 2020-06-19 ENCOUNTER — Ambulatory Visit: Payer: Medicare HMO | Admitting: Cardiovascular Disease

## 2020-06-19 VITALS — BP 130/72 | HR 60 | Ht 62.0 in | Wt 120.0 lb

## 2020-06-19 DIAGNOSIS — I480 Paroxysmal atrial fibrillation: Secondary | ICD-10-CM | POA: Diagnosis not present

## 2020-06-19 DIAGNOSIS — I25728 Atherosclerosis of autologous artery coronary artery bypass graft(s) with other forms of angina pectoris: Secondary | ICD-10-CM | POA: Diagnosis not present

## 2020-06-19 DIAGNOSIS — I10 Essential (primary) hypertension: Secondary | ICD-10-CM

## 2020-06-19 DIAGNOSIS — Z7901 Long term (current) use of anticoagulants: Secondary | ICD-10-CM

## 2020-06-19 DIAGNOSIS — G459 Transient cerebral ischemic attack, unspecified: Secondary | ICD-10-CM | POA: Diagnosis not present

## 2020-06-19 DIAGNOSIS — E7800 Pure hypercholesterolemia, unspecified: Secondary | ICD-10-CM

## 2020-06-19 DIAGNOSIS — I712 Thoracic aortic aneurysm, without rupture: Secondary | ICD-10-CM

## 2020-06-19 DIAGNOSIS — I351 Nonrheumatic aortic (valve) insufficiency: Secondary | ICD-10-CM

## 2020-06-19 DIAGNOSIS — E039 Hypothyroidism, unspecified: Secondary | ICD-10-CM

## 2020-06-19 DIAGNOSIS — I7121 Aneurysm of the ascending aorta, without rupture: Secondary | ICD-10-CM

## 2020-06-19 NOTE — Progress Notes (Signed)
Cardiology Office Note    Date:  06/20/2020   ID:  Evelyn [name], DOB 05-Nov-1940, MRN [id number]  PCP:  Bennie Pierini, FNP  Cardiologist:   Thurmon Fair, MD   Chief Complaint  Patient presents with  . Transient Ischemic Attack  . Atrial Fibrillation    History of Present Illness:  Evelyn [name] is a 80 y.o. female with long-standing history of coronary artery disease with previous stents to LAD followed by bypass surgery (LIMA to LAD, SVG to diagonal, SVG to OM in 2004), hyperlipidemia, recent TIA which led to implantation of a loop recorder that promptly demonstrated the presence of asymptomatic paroxysmal atrial fibrillation with rapid ventricular response, mild ascending aortic aneurysm, aortic insufficiency, hypertension, hyperlipidemia.  She reports an episode of about 20 minutes of inability to speak that occurred abruptly, during a phone conversation last month. Her tongue "curled up" and wouldn't allow her to speak. Someone else had to complete the phone call for her. The episode resolved spontaneously. She was not aware of racing heart beat or other CV complaints in the days/weeks leading up to that event.  She has had a handful of episodes of chest tightness that seem to now occur at night. The discomfort is severe and wakes her from sleep , but resolves quickly with sublingual NTG.     She tried taking Multaq but did not tolerate it due to dyspnea and nausea. On metoprolol only.  Her device download 06/14/2020 shows rare episodes of brief paroxysmal atrial fibrillation (longest and most recent 14 minutes, New Year's Eve).  She had a previous episode suggesting TIA in May 2018.  She had a CT angiogram that did not show any evidence of meaningful extracranial carotid atherosclerosis or stenoses.  She also had an echocardiogram with a bubble study in May 2018 without any evidence of intracardiac shunt.  She had normal carotid ultrasound in 2014.  Angiography in  October 2018 showed virtually no change in coronary anatomy since 2013.  She has a chronically occluded LAD artery with multiple occluded stents, but has good downstream blood flow via LIMA-LAD and SVG-diagonal bypasses.  There is probably a significant area of myocardial septum that is ischemic, upstream of the blocked mid LAD.  There was no mention of a SVG bypass to the oblique marginal branch, but the left circumflex coronary artery was widely patent with no more than 20% stenosis (Chest CTA showed that the SVG-OM is now occluded).  Left ventricular regional wall motion and overall systolic function remain normal and the LVEDP was only 14 mmHg. Although the chest CT angiogram suggested there was a 70% stenosis in the left main coronary artery, that potentially could lead to ischemia in the circumflex territory, this was not found to be the case by invasive angiography.  Had an echocardiogram in May 2018, showing normal LVEF 55-60%, with inferior and inferoseptal mild hypokinesis, mildly dilated ascending aorta, mild aortic insufficiency. In September 2017 her treadmill nuclear stress test showed normal myocardial perfusion and EF 62%. In October 2017 she had the last evaluation of her aorta, showing mild aneurysmal dilation of the ascending aorta at 3.7 cm and of the aortic arch at 3.3 cm. (She is a very small frame lady, 5 foot 2 inches tall, 112 pounds).  CT angiography of the aorta October 2018 showed unchanged aortic root dimension at 3.3 cm.  The SVG to OM was described as occluded.  She has long-standing history of coronary disease, undergoing her first cardiac catheterization  in 1999. She received stents to the LAD artery in 2002 and had to have cutting balloon atherectomy for in-stent restenosis in 2003 followed by a new LAD stent in 2004 for disease progression. Ultimately after again developing restenosis she underwent coronary bypass surgery (LIMA to LAD, SVG to diagonal, SVG to OM in 2004). She  had cardiac catheterization in July 2013 and October 2018, without evidence of significant progression. This demonstrated interval total occlusion of the proximal LAD artery although the distant bypasses are all widely patent. On her stress test in 2013 and 2017  the perfusion images were "normal". It is felt that she may have occasional angina pectoris related to ischemia in the proximal septum since there is no retrograde flow from the bypasses to the proximal LAD. Her last echo in May 2018 showed normal EF 55-60%, but reported inferior and inferoseptal mild hypokinesis. 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  CT angiogram of the head and neck performed in May 2018 for TIA showed mild extracranial atherosclerosis. She has treated systemic hypertension and hyperlipidemia and hypothyroidism.  She had previous cervical spine surgery with bone grafting and discectomy in 2009.   Past Medical History:  Diagnosis Date  . Aneurysm (HCC)   . Aortic insufficiency   . Atrial fibrillation (HCC)   . CAD (coronary artery disease)    CABG LIMA to LAD,vein graft to diagonal,vein graft to OM  . Dyslipidemia   . GERD (gastroesophageal reflux disease)   . Hypothyroid     Past Surgical History:  Procedure Laterality Date  . ABDOMINAL HYSTERECTOMY    . BACK SURGERY  2009  . CORONARY ANGIOPLASTY  2003   cutting balloon atherectomy in-stent stenosis of the LAD.  [name] CORONARY ANGIOPLASTY WITH STENT PLACEMENT  2002   remote PCI & stenting LAD,  . CORONARY ARTERY BYPASS GRAFT  2004   LIMA to LAD,vein graft to diagonal,vein graft to OM  . FOOT SURGERY    . LEFT HEART CATH AND CORS/GRAFTS ANGIOGRAPHY N/A 02/23/2017   Procedure: LEFT HEART CATH AND CORS/GRAFTS ANGIOGRAPHY;  Surgeon: Iran Ouch, MD;  Location: [facility];  Service: Cardiovascular;  Laterality: N/A;  . LEFT HEART CATHETERIZATION WITH CORONARY ANGIOGRAM N/A 12/15/2011   Procedure:  LEFT HEART CATHETERIZATION WITH CORONARY ANGIOGRAM;  Surgeon: Thurmon Fair, MD;  Location: [facility] CATH [facility];  Service: Cardiovascular;  Laterality: N/A;  . LOOP RECORDER INSERTION N/A 06/14/2018   Procedure: LOOP RECORDER INSERTION;  Surgeon: Thurmon Fair, MD;  Location: [facility];  Service: Cardiovascular;  Laterality: N/A;  . NECK SURGERY    . NM MYOCAR PERF WALL MOTION  12/01/2011   normal study-persistent exercise induced ECG changes raises the concern for ischemia (balanced ischemia).  . NOSE SURGERY    . TONSILLECTOMY  1948  . TUBAL LIGATION      Current Medications: Outpatient Medications Prior to Visit  Medication Sig Dispense Refill  . apixaban (ELIQUIS) 2.5 MG TABS tablet Take 1 tablet (2.5 mg total) by mouth 2 (two) times daily. 180 tablet 1  . diltiazem (CARDIZEM) 30 MG tablet Take 1 tablet (30 mg total) by mouth every 6 (six) hours as needed (for rapid heart rate). 90 tablet 1  . furosemide (LASIX) 20 MG tablet Take 1 tablet (20 mg total) by mouth daily. 90 tablet 1  . HYDROcodone-acetaminophen (NORCO) 5-325 MG tablet Take 1 tablet by mouth every 6 (six) hours as needed for moderate pain.  120 tablet 0  . hydrocortisone 2.5 % cream APPLY TO AFFECTED AREA TWICE A DAY 28.35 g 5  . isosorbide mononitrate (IMDUR) 60 MG 24 hr tablet TAKE 1 TABLET (60 MG TOTAL) DAILY BY MOUTH. 90 tablet 1  . levothyroxine (SYNTHROID) 75 MCG tablet Take 1 tablet (75 mcg total) by mouth daily before breakfast. 90 tablet 1  . loratadine (CLARITIN) 10 MG tablet Take 1 tablet (10 mg total) by mouth daily. 90 tablet 3  . metoprolol tartrate (LOPRESSOR) 100 MG tablet Take 1 tablet (100 mg total) by mouth 2 (two) times daily. 180 tablet 1  . nitroGLYCERIN (NITROSTAT) 0.4 MG SL tablet PLACE 1 TABLET (0.4 MG TOTAL) UNDER THE TONGUE EVERY 5 (FIVE) MINUTES AS NEEDED FOR CHEST PAIN. 25 tablet 2  . rosuvastatin (CRESTOR) 10 MG tablet TAKE 1 TABLET BY MOUTH EVERY DAY IN THE EVENING 90 tablet 1  . rosuvastatin  (CRESTOR) 20 MG tablet TAKE 1 TABLET BY MOUTH EVERY DAY IN THE EVENING 90 tablet 1  . Zoledronic Acid (RECLAST IV) Inject into the vein. Once yearly     No facility-administered medications prior to visit.     Allergies:   Sulfa antibiotics   Social History   Socioeconomic History  . Marital status: Widowed    Spouse name: Not on file  . Number of children: 2  . Years of education: Associates  . Highest education level: Associate degree: academic program  Occupational History  . Occupation: Retired  Tobacco Use  . Smoking status: Never Smoker  . Smokeless tobacco: Never Used  Vaping Use  . Vaping Use: Never used  Substance and Sexual Activity  . Alcohol use: No  . Drug use: No  . Sexual activity: Not Currently  Other Topics Concern  . Not on file  Social History Narrative   Mother of 2 with 4 grandchildren   Right-handed   Caffeine: 8-10 cups coffee   Social Determinants of Health   Financial Resource Strain: Not on file  Food Insecurity: Not on file  Transportation Needs: Not on file  Physical Activity: Not on file  Stress: Not on file  Social Connections: Not on file     Family History:  The patient's family history includes Asthma in her maternal grandmother; Cancer (age of onset: 19) in her sister; Heart attack in her mother; Heart disease in her father and sister; Heart failure in her father; Hypertension in her father; Stroke in her brother, daughter, and son; Voice disorder in her daughter.   ROS:   Please see the history of present illness.    ROS All other systems are reviewed and are negative.   PHYSICAL EXAM:   VS:  BP 130/72   Pulse 60   Ht 5\' 2"  (1.575 m)   Wt 120 lb (54.4 kg)   SpO2 98%   BMI 21.95 kg/m       General: Alert, oriented x3, no distress, lean Head: no evidence of trauma, PERRL, EOMI, no exophtalmos or lid lag, no myxedema, no xanthelasma; normal ears, nose and oropharynx Neck: normal jugular venous pulsations and no  hepatojugular reflux; brisk carotid pulses without delay and no carotid bruits Chest: clear to auscultation, no signs of consolidation by percussion or palpation, normal fremitus, symmetrical and full respiratory excursions Cardiovascular: normal position and quality of the apical impulse, regular rhythm, normal first and second heart sounds, no murmurs, rubs or gallops Abdomen: no tenderness or distention, no masses by palpation, no abnormal pulsatility or arterial bruits,  normal bowel sounds, no hepatosplenomegaly Extremities: no clubbing, cyanosis or edema; 2+ radial, ulnar and brachial pulses bilaterally; 2+ right femoral, posterior tibial and dorsalis pedis pulses; 2+ left femoral, posterior tibial and dorsalis pedis pulses; no subclavian or femoral bruits Neurological: grossly nonfocal Psych: Normal mood and affect   Wt Readings from Last 3 Encounters:  06/19/20 120 lb (54.4 kg)  04/28/20 113 lb (51.3 kg)  03/25/20 118 lb (53.5 kg)      Studies/Labs Reviewed:   EKG:  EKG is not ordered today.12/18/2019 ECG shows normal sinus rhythm, nonspecific T wave changes (subtle T wave inversion V2-V3, unchanged from previous tracings), QTC 411 seconds. 03/25/2020: ALT 7; BUN 9; Creatinine, Ser 0.59; Hemoglobin 13.3; Platelets 215; Potassium 3.8; Sodium 139; TSH 0.310   Lipid Panel    Component Value Date/Time   CHOL 170 03/25/2020 1103   TRIG 64 03/25/2020 1103   HDL 63 03/25/2020 1103   CHOLHDL 2.7 03/25/2020 1103   CHOLHDL 2.0 07/11/2014 0913   VLDL 12 07/11/2014 0913   LDLCALC 95 03/25/2020 1103     ASSESSMENT:    1. TIA (transient ischemic attack)   2. Paroxysmal atrial fibrillation (HCC)   3. Long term (current) use of anticoagulants   4. Coronary artery disease involving autologous artery coronary bypass graft with other forms of angina pectoris (HCC)   5. Essential hypertension   6. Hypercholesterolemia   7. Nonrheumatic aortic (valve) insufficiency   8. Ascending aortic  aneurysm (HCC)   9. Acquired hypothyroidism      PLAN:  In order of problems listed above:  1. TIA: her episode of dysarthria/aphasia type symptoms may have been a TIA. Her burden of atrial fibrillation is low. Need to make sure she does not have carotid disease (had US in 2014, none since).  2. AFib/AFlutter: episodes are infrequent and brief, albeit with significant RVR and therefore symptomatic. She has a history of TIA and high embolic risk (CHA2DS2-VASc 6).  Continue with anticoagulation. 3. Anticoagulation: she is on the lower dose of Eliquis, but has gained weight. Both weight and age are at the cutoff for Eliquis dosing. I wonder whether we need to readjust her Eliquis dose. 4. CAD s/p CABG: She continues to have occasional episodes of angina pectoris, now more commonly nocturnal. Change isosorbide timing to the evening. Could be vasospasm. May also have ischemia in the proximal septum downstream of the occluded LAD artery but upstream of the LIMA supplied territory.  Also has an occluded SVG to the oblique marginal artery, although the native circumflex artery is patent.  Not on aspirin due to anticoagulation.   5. HTN: Controlled.  Reminded her of the risk of rebound with abrupt withdrawal of beta-blockers. 6. HLP: Need to add repatha. She did not tolerate more potent statins and is not at goal (LDL<70) on pravastatin + ezetimibe. 7. 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 Asympotomatic. 8. Asc Ao Aneurysm: Very small and stable in size. Will not be a preventive surgical candidate, so we are not performing routine monitoring. 9. Acquired hypothyroidism/iatrogenic hyperthyroidism: may have increased frequency of angina, but last TSH OK and no symptoms of hyperthyroidism.  Time spent with patient: 40 minutes.  Medication Adjustments/Labs and Tests Ordered: Current medicines are reviewed at length with the patient today.  Concerns regarding medicines are  outlined above.  Medication changes, Labs and Tests ordered today are listed in the Patient Instructions below. Patient Instructions  Medication Instructions:  TAKE the Isosorbide in  the evening  *If you need a refill on your cardiac medications before your next appointment, please call your pharmacy*   Lab Work: None ordered If you have labs (blood work) drawn today and your tests are completely normal, you will receive your results only by: [name] MyChart Message (if you have MyChart) OR . A paper copy in the mail If you have any lab test that is abnormal or we need to change your treatment, we will call you to review the results.   Testing/Procedures: Your physician has requested that you have a carotid duplex. This test is an ultrasound of the carotid arteries in your neck. It looks at blood flow through these arteries that supply the brain with blood. Allow one hour for this exam. There are no restrictions or special instructions. This will take place at [address] [facility], [address].   Follow-Up: At [facility] you and your health needs are our priority.  As part of our continuing mission to provide you with exceptional heart care, we have created designated Provider Care Teams.  These Care Teams include your primary Cardiologist (physician) and Advanced Practice Providers (APPs -  Physician Assistants and Nurse Practitioners) who all work together to provide you with the care you need, when you need it.  We recommend signing up for the patient portal called "MyChart".  Sign up information is provided on this After Visit Summary.  MyChart is used to connect with patients for Virtual Visits (Telemedicine).  Patients are able to view lab/test results, encounter notes, upcoming appointments, etc.  Non-urgent messages can be sent to your provider as well.   To learn more about what you can do with MyChart, go to [email or web address].    Your next appointment:   6 month(s)  The format  for your next appointment:   In Person  Provider:   Thurmon Fair, MD       Signed, Thurmon Fair, MD  06/20/2020 8:32 AM    [facility] [address] [place], [place], [place]  [address] Phone: [phone]; Fax: [phone]

## 2020-06-19 NOTE — Patient Instructions (Signed)
Medication Instructions:  TAKE the Isosorbide in the evening  *If you need a refill on your cardiac medications before your next appointment, please call your pharmacy*   Lab Work: None ordered If you have labs (blood work) drawn today and your tests are completely normal, you will receive your results only by: [name] MyChart Message (if you have MyChart) OR . A paper copy in the mail If you have any lab test that is abnormal or we need to change your treatment, we will call you to review the results.   Testing/Procedures: Your physician has requested that you have a carotid duplex. This test is an ultrasound of the carotid arteries in your neck. It looks at blood flow through these arteries that supply the brain with blood. Allow one hour for this exam. There are no restrictions or special instructions. This will take place at [address] [facility], [address].   Follow-Up: At [facility], you and your health needs are our priority.  As part of our continuing mission to provide you with exceptional heart care, we have created designated Provider Care Teams.  These Care Teams include your primary Cardiologist (physician) and Advanced Practice Providers (APPs -  Physician Assistants and Nurse Practitioners) who all work together to provide you with the care you need, when you need it.  We recommend signing up for the patient portal called "MyChart".  Sign up information is provided on this After Visit Summary.  MyChart is used to connect with patients for Virtual Visits (Telemedicine).  Patients are able to view lab/test results, encounter notes, upcoming appointments, etc.  Non-urgent messages can be sent to your provider as well.   To learn more about what you can do with MyChart, go to [email or web address].    Your next appointment:   6 month(s)  The format for your next appointment:   In Person  Provider:   Thurmon Fair, MD

## 2020-06-20 ENCOUNTER — Encounter (HOSPITAL_COMMUNITY): Payer: Medicare HMO

## 2020-06-25 ENCOUNTER — Ambulatory Visit (INDEPENDENT_AMBULATORY_CARE_PROVIDER_SITE_OTHER): Payer: Medicare HMO | Admitting: Nurse Practitioner

## 2020-06-25 ENCOUNTER — Encounter: Payer: Self-pay | Admitting: Nurse Practitioner

## 2020-06-25 ENCOUNTER — Other Ambulatory Visit: Payer: Self-pay

## 2020-06-25 DIAGNOSIS — R300 Dysuria: Secondary | ICD-10-CM | POA: Diagnosis not present

## 2020-06-25 LAB — URINALYSIS, COMPLETE
Bilirubin, UA: NEGATIVE
Glucose, UA: NEGATIVE
Ketones, UA: NEGATIVE
Nitrite, UA: NEGATIVE
Protein,UA: NEGATIVE
Specific Gravity, UA: <1.005 — ABNORMAL LOW (ref 1.005–1.030)
Urobilinogen, Ur: 0.2 mg/dL (ref 0.2–1.0)
pH, UA: 6 (ref 5.0–7.5)

## 2020-06-25 LAB — MICROSCOPIC EXAMINATION

## 2020-06-25 MED ORDER — CEPHALEXIN 500 MG PO CAPS: 500.0000 mg | ORAL_CAPSULE | Freq: Two times a day (BID) | ORAL | 0 refills | Status: DC

## 2020-06-25 NOTE — Progress Notes (Signed)
Carelink Summary Report / Loop Recorder 

## 2020-06-25 NOTE — Progress Notes (Signed)
    Virtual Visit via telephone Note Due to COVID-19 pandemic this visit was conducted virtually. This visit type was conducted due to national recommendations for restrictions regarding the COVID-19 Pandemic (e.g. social distancing, sheltering in place) in an effort to limit this patient's exposure and mitigate transmission in our community. All issues noted in this document were discussed and addressed.  A physical exam was not performed with this format.  I connected with Evelyn [name] on 06/25/20 at  4:10 PM by telephone and verified that I am speaking with the correct person using two identifiers. Evelyn [name] is currently located at home during visit. The provider, Daryll Drown, NP is located in their office at time of visit.  I discussed the limitations, risks, security and privacy concerns of performing an evaluation and management service by telephone and the availability of in person appointments. I also discussed with the patient that there may be a patient responsible charge related to this service. The patient expressed understanding and agreed to proceed.   History and Present Illness:  Urinary Tract Infection  This is a new problem. The current episode started in the past 7 days. The problem occurs every urination. The problem has been unchanged. The quality of the pain is described as aching. The pain is moderate. There has been no fever. Associated symptoms include hesitancy. Pertinent negatives include no chills, discharge, flank pain or vomiting. She has tried nothing for the symptoms. There is no history of catheterization or urinary stasis.      Review of Systems  Constitutional: Negative for chills.  Gastrointestinal: Negative for vomiting.  Genitourinary: Positive for hesitancy. Negative for flank pain.     Observations/Objective: Televisit  Assessment and Plan:  Dysuria Worsening UTI symptoms in the last 1 week.  Patient is reporting pelvic pain and  pressure, and not being able to empty bladder.  Denies nausea, and fever. Urinalysis and culture ordered results pending.  Started Keflex.  Provided education, will reassess and evaluate treatment options with urine cultures completed. Rx sent to pharmacy. Follow-up with worsening or unresolved symptoms.      Follow Up Instructions: Worsening or unresolved symptoms    I discussed the assessment and treatment plan with the patient. The patient was provided an opportunity to ask questions and all were answered. The patient agreed with the plan and demonstrated an understanding of the instructions.   The patient was advised to call back or seek an in-person evaluation if the symptoms worsen or if the condition fails to improve as anticipated.  The above assessment and management plan was discussed with the patient. The patient verbalized understanding of and has agreed to the management plan. Patient is aware to call the clinic if symptoms persist or worsen. Patient is aware when to return to the clinic for a follow-up visit. Patient educated on when it is appropriate to go to the emergency department.   Time call ended:  04:19 AM  I provided 9 minutes of non-face-to-face time during this encounter.    Daryll Drown, NP

## 2020-06-25 NOTE — Assessment & Plan Note (Signed)
Worsening UTI symptoms in the last 1 week.  Patient is reporting pelvic pain and pressure, and not being able to empty bladder.  Denies nausea, and fever. Urinalysis and culture ordered results pending.  Started Keflex.  Provided education, will reassess and evaluate treatment options with urine cultures completed. Rx sent to pharmacy. Follow-up with worsening or unresolved symptoms.

## 2020-06-26 ENCOUNTER — Ambulatory Visit: Payer: Medicare HMO | Admitting: Nurse Practitioner

## 2020-06-26 ENCOUNTER — Other Ambulatory Visit: Payer: Self-pay

## 2020-06-26 ENCOUNTER — Encounter: Payer: Self-pay | Admitting: Nurse Practitioner

## 2020-06-26 VITALS — BP 130/73 | HR 60 | Temp 97.8°F | Ht 62.0 in | Wt 119.0 lb

## 2020-06-26 DIAGNOSIS — E782 Mixed hyperlipidemia: Secondary | ICD-10-CM

## 2020-06-26 DIAGNOSIS — M5030 Other cervical disc degeneration, unspecified cervical region: Secondary | ICD-10-CM

## 2020-06-26 DIAGNOSIS — F419 Anxiety disorder, unspecified: Secondary | ICD-10-CM

## 2020-06-26 DIAGNOSIS — I10 Essential (primary) hypertension: Secondary | ICD-10-CM

## 2020-06-26 DIAGNOSIS — I712 Thoracic aortic aneurysm, without rupture: Secondary | ICD-10-CM | POA: Diagnosis not present

## 2020-06-26 DIAGNOSIS — R52 Pain, unspecified: Secondary | ICD-10-CM

## 2020-06-26 DIAGNOSIS — E039 Hypothyroidism, unspecified: Secondary | ICD-10-CM

## 2020-06-26 DIAGNOSIS — I480 Paroxysmal atrial fibrillation: Secondary | ICD-10-CM | POA: Diagnosis not present

## 2020-06-26 DIAGNOSIS — I7121 Aneurysm of the ascending aorta, without rupture: Secondary | ICD-10-CM

## 2020-06-26 MED ORDER — HYDROCODONE-ACETAMINOPHEN 5-325 MG PO TABS: 1.0000 | ORAL_TABLET | Freq: Four times a day (QID) | ORAL | 0 refills | Status: DC | PRN

## 2020-06-26 NOTE — Progress Notes (Signed)
Subjective:    Patient ID: Evelyn [name], female    DOB: 1941/03/12, 80 y.o.   MRN: [id number]   Chief Complaint: medical management of chronic issues     HPI:  1. Essential hypertension No c/o chest pain, sob or headache. Does not check blood pressure at home. BP Readings from Last 3 Encounters:  06/19/20 130/72  04/28/20 138/62  03/25/20 126/69     2. Paroxysmal atrial fibrillation (HCC) Denies palpitations or heart racing.  3. Ascending aortic aneurysm ([place]) CT and angiogram of chest was done 11/15/19. Was normal. ascending aorta measurement was 19m with no significant atherosclerosis.  4. Mixed hyperlipidemia Does not really watch diet and does very little exercise. Lab Results  Component Value Date   CHOL 170 03/25/2020   HDL 63 03/25/2020   LDLCALC 95 03/25/2020   TRIG 64 03/25/2020   CHOLHDL 2.7 03/25/2020   5. Acquired hypothyroidism No problems that she is aware of. Lab Results  Component Value Date   TSH 0.310 (L) 03/25/2020     6. Anxiety Stays anxious. Is on ne medication currently. GAD 7 : Generalized Anxiety Score 06/26/2020 12/26/2019 09/24/2019 08/03/2019  Nervous, Anxious, on Edge 0 0 0 0  Control/stop worrying - 0 0 0  Worry too much - different things 0 0 0 0  Trouble relaxing 1 0 0 0  Restless 0 0 0 0  Easily annoyed or irritable 0 - 0 0  Afraid - awful might happen 0 0 0 0  Total GAD 7 Score - - 0 0  Anxiety Difficulty Not difficult at all Not difficult at all Not difficult at all Not difficult at all      7. Pain management Pain assessment: Cause of pain- DDD Pain location- low back and in all joints Pain on scale of 1-10- 2/10 currently Frequency- daily What increases pain-to much activity What makes pain Better-rest Effects on ADL - none Any change in general medical condition-none  Current opioids rx- norco 5/325 QID- usually only takes BID # meds rx- 120 Effectiveness of current meds-helps Adverse reactions from pain  meds-none Morphine equivalent- 20MME  Pill count performed-No Last drug screen - 03/25/20 ( high risk q375mmoderate risk q6m1mow risk yearly ) Urine drug screen today- No Was the [facility]- yes  If yes were their any concerning findings? - no   Overdose risk: 1    Pain contract signed on: 06/26/19     Outpatient Encounter Medications as of 06/26/2020  Medication Sig  . apixaban (ELIQUIS) 2.5 MG TABS tablet Take 1 tablet (2.5 mg total) by mouth 2 (two) times daily.  . cephALEXin (KEFLEX) 500 MG capsule Take 1 capsule (500 mg total) by mouth 2 (two) times daily.  . dMarland Kitchenltiazem (CARDIZEM) 30 MG tablet Take 1 tablet (30 mg total) by mouth every 6 (six) hours as needed (for rapid heart rate).  . furosemide (LASIX) 20 MG tablet Take 1 tablet (20 mg total) by mouth daily.  . hydrocortisone 2.5 % cream APPLY TO AFFECTED AREA TWICE A DAY  . isosorbide mononitrate (IMDUR) 60 MG 24 hr tablet TAKE 1 TABLET (60 MG TOTAL) DAILY BY MOUTH.  . lMarland Kitchenvothyroxine (SYNTHROID) 75 MCG tablet Take 1 tablet (75 mcg total) by mouth daily before breakfast.  . loratadine (CLARITIN) 10 MG tablet Take 1 tablet (10 mg total) by mouth daily.  . metoprolol tartrate (LOPRESSOR) 100 MG tablet Take 1 tablet (100 mg total) by mouth 2 (two) times daily.  .[name]  nitroGLYCERIN (NITROSTAT) 0.4 MG SL tablet PLACE 1 TABLET (0.4 MG TOTAL) UNDER THE TONGUE EVERY 5 (FIVE) MINUTES AS NEEDED FOR CHEST PAIN.  . rosuvastatin (CRESTOR) 10 MG tablet TAKE 1 TABLET BY MOUTH EVERY DAY IN THE EVENING  . rosuvastatin (CRESTOR) 20 MG tablet TAKE 1 TABLET BY MOUTH EVERY DAY IN THE EVENING  . Zoledronic Acid (RECLAST IV) Inject into the vein. Once yearly   No facility-administered encounter medications on file as of 06/26/2020.    Past Surgical History:  Procedure Laterality Date  . ABDOMINAL HYSTERECTOMY    . BACK SURGERY  2009  . CORONARY ANGIOPLASTY  2003   cutting balloon atherectomy in-stent stenosis of the LAD.  [name] CORONARY  ANGIOPLASTY WITH STENT PLACEMENT  2002   remote PCI & stenting LAD,  . CORONARY ARTERY BYPASS GRAFT  2004   LIMA to LAD,vein graft to diagonal,vein graft to OM  . FOOT SURGERY    . LEFT HEART CATH AND CORS/GRAFTS ANGIOGRAPHY N/A 02/23/2017   Procedure: LEFT HEART CATH AND CORS/GRAFTS ANGIOGRAPHY;  Surgeon: Wellington Hampshire, MD;  Location: [facility];  Service: Cardiovascular;  Laterality: N/A;  . LEFT HEART CATHETERIZATION WITH CORONARY ANGIOGRAM N/A 12/15/2011   Procedure: LEFT HEART CATHETERIZATION WITH CORONARY ANGIOGRAM;  Surgeon: Sanda Klein, MD;  Location: [facility];  Service: Cardiovascular;  Laterality: N/A;  . LOOP RECORDER INSERTION N/A 06/14/2018   Procedure: LOOP RECORDER INSERTION;  Surgeon: Sanda Klein, MD;  Location: [facility];  Service: Cardiovascular;  Laterality: N/A;  . NECK SURGERY    . NM MYOCAR PERF WALL MOTION  12/01/2011   normal study-persistent exercise induced ECG changes raises the concern for ischemia (balanced ischemia).  . NOSE SURGERY    . TONSILLECTOMY  1948  . TUBAL LIGATION      Family History  Problem Relation Age of Onset  . Heart attack Mother   . Heart failure Father   . Hypertension Father   . Heart disease Father   . Asthma Maternal Grandmother   . Heart disease Sister   . Stroke Brother   . Voice disorder Daughter        vocal dystonia  . Stroke Daughter   . Stroke Son   . Cancer Sister 43    New complaints: None today  Social history: Lives by herself. Kids check on her daily  Controlled substance contract: n/a    Review of Systems  Constitutional: Negative for diaphoresis.  Eyes: Negative for pain.  Respiratory: Negative for shortness of breath.   Cardiovascular: Negative for chest pain, palpitations and leg swelling.  Gastrointestinal: Negative for abdominal pain.  Endocrine: Negative for polydipsia.  Skin: Negative for rash.  Neurological: Negative for dizziness, weakness and headaches.   Hematological: Does not bruise/bleed easily.  All other systems reviewed and are negative.      Objective:   Physical Exam Vitals and nursing note reviewed.  Constitutional:      General: She is not in acute distress.    Appearance: Normal appearance. She is well-developed and well-nourished.  HENT:     Head: Normocephalic.     Nose: Nose normal.     Mouth/Throat:     Mouth: Oropharynx is clear and moist.  Eyes:     Extraocular Movements: EOM normal.     Pupils: Pupils are equal, round, and reactive to light.  Neck:     Vascular: No carotid bruit or JVD.  Cardiovascular:     Rate and Rhythm:  Normal rate and regular rhythm.     Pulses: Intact distal pulses.     Heart sounds: Normal heart sounds.  Pulmonary:     Effort: Pulmonary effort is normal. No respiratory distress.     Breath sounds: Normal breath sounds. No wheezing or rales.  Chest:     Chest wall: No tenderness.  Abdominal:     General: Bowel sounds are normal. There is no distension or abdominal bruit. Aorta is normal.     Palpations: Abdomen is soft. There is no hepatomegaly, splenomegaly, mass or pulsatile mass.     Tenderness: There is no abdominal tenderness.  Musculoskeletal:        General: No edema. Normal range of motion.     Cervical back: Normal range of motion and neck supple.  Lymphadenopathy:     Cervical: No cervical adenopathy.  Skin:    General: Skin is warm and dry.  Neurological:     Mental Status: She is alert and oriented to person, place, and time.     Deep Tendon Reflexes: Reflexes are normal and symmetric.  Psychiatric:        Mood and Affect: Mood and affect normal.        Behavior: Behavior normal.        Thought Content: Thought content normal.        Judgment: Judgment normal.    BP 130/73   Pulse 60   Temp 97.8 F (36.6 C) (Temporal)   Ht 5' 2"  (1.575 m)   Wt 119 lb (54 kg)   SpO2 94%   BMI 21.77 kg/m         Assessment & Plan:  KIMELA MALSTROM comes in today  with chief complaint of No chief complaint on file.   Diagnosis and orders addressed:  1. Essential hypertension Low sodium diet - CBC with Differential/Platelet - CMP14+EGFR  2. Paroxysmal atrial fibrillation (HCC) Avoid caffeine  3. Ascending aortic aneurysm ([place]) Will recheck CT scan in 3 years  4. Mixed hyperlipidemia Low fat diet - Lipid panel  5. Acquired hypothyroidism Labs pending  6. Anxiety Stress management  7. Pain management Pain meds filled  8. DDD (degenerative disc disease), cervical Only take as needed - HYDROcodone-acetaminophen (NORCO) 5-325 MG tablet; Take 1 tablet by mouth every 6 (six) hours as needed for moderate pain.  Dispense: 120 tablet; Refill: 0   Labs pending Health Maintenance reviewed Diet and exercise encouraged  Follow up plan: 3 months   Mary-Margaret Hassell Done, FNP

## 2020-06-26 NOTE — Patient Instructions (Signed)
Opioid Pain Medicine Management Opioid pain medicines are strong medicines that are used to treat bad or very bad pain. When you take them for a short time, they can help you:  Sleep better.  Do better in physical therapy.  Feel better during the first few days after you get hurt.  Recover from surgery. Only take these medicines if a doctor says that you can. You should only take them for a short time. This is because opioids can be hard to stop taking (they are addictive). The longer you take opioids, the harder it may be to stop taking them (opioid use disorder). What are the risks? Opioids can cause problems (side effects). Taking them for more than 3 days raises your chance of problems, such as:  Trouble pooping (constipation).  Feeling sick to your stomach (nausea).  Vomiting.  Feeling very sleepy.  Confusion.  Not being able to stop taking the medicine.  Breathing problems. Taking opioids for a long time can make it hard for you to do daily tasks. It can also put you at risk for:  Car accidents.  Depression.  Suicide.  Heart attack.  Taking too much of the medicine (overdose), which can sometimes lead to death. What is a pain treatment plan? A pain treatment plan is a plan made by you and your doctor. Work with your doctor to make a plan for treating your pain. To help you do this:  Talk about the goals of your treatment, including: ? How much pain you might expect to have. ? How you will manage the pain.  Talk about the risks and benefits of taking these medicines for your condition.  Remember that a good treatment plan uses more than one approach and lowers the risks of side effects.  Tell your doctor about the amount of medicines you take and about any drug or alcohol use.  Get your pain medicine prescriptions from only one doctor. Pain can be managed with other treatments. Work with your doctor to find other ways to help your pain, such as:  Physical  therapy.  Counseling.  Eating healthy foods.  Brain exercises.  Massage.  Meditation.  Other pain medicines.  Doing gentle exercises. Tapering your use of opioids If you have been taking opioids for more than a few weeks, you may need to slowly decrease (taper) how much you take until you stop taking them. Doing this can lower your chance of having symptoms, such as:  Pain and cramping in your belly (abdomen).  Feeling sick to your stomach.  Sweating.  Feeling very sleepy.  Feeling restless.  Shaking you cannot control (tremors).  Cravings for the medicine. Do not try to stop taking them by yourself. Work with your doctor to stop. Your doctor will help you take less until you are not taking the medicine at all. Follow these instructions at home: Safety and storage  While you are taking opioids: ? Do not drive. ? Do not use machines or power tools. ? Do not sign important papers (legal documents). ? Do not drink alcohol. ? Do not take sleeping pills. ? Do not take care of children by yourself. ? Do not do activities where you need to climb or be in high places, like working on a ladder. ? Do not go into any water, such as a lake, river, ocean, swimming pool, or hot tub.  Keep your opioids locked up or in a place where children cannot reach them.  Do not share your pain   medicine with anyone.   Getting rid of leftover pills Do not save any leftover pills. Get rid of leftover pills safely by:  Taking them to a take-back program in your area.  Bringing them to a pharmacy that has a container for throwing away pills (pill disposal).  Flushing them down the toilet. Check the label or package insert of your medicine to see whether this is safe to do.  Throwing them in the trash. Check the label or package insert of your medicine to see whether this is safe to do. If it is safe to throw them out: 1. Take the pills out of their container. 2. Put the pills into a  container you can seal. 3. Mix the pills with used coffee grounds, food scraps, dirt, or cat litter. 4. Put this in the trash. Activity  Return to your normal activities as told by your doctor. Ask your doctor what activities are safe for you.  Avoid doing things that make your pain worse.  Do exercises as told by your doctor. General instructions  You may need to take these actions to prevent or treat trouble pooping: ? Drink enough fluid to keep your pee (urine) pale yellow. ? Take over-the-counter or prescription medicines. ? Eat foods that are high in fiber. These include beans, whole grains, and fresh fruits and vegetables. ? Limit foods that are high in fat and sugar. These include fried or sweet foods.  Keep all follow-up visits as told by your doctor. This is important. Where to find support If you have been taking opioids for a long time, think about getting help quitting from a local support group or counselor. Ask your doctor about this. Where to find more information  Centers for Disease Control and Prevention (CDC): www.cdc.gov  U.[place] Food and Drug Administration (FDA): www.fda.gov Get help right away if: Seek medical care right away if you are taking opioids and you, or people close to you, notice any of the following:  You have trouble breathing.  Your breathing is slower or more shallow than normal.  You have a very slow heartbeat.  You feel very confused.  You pass out (faint).  You are very sleepy.  Your speech is not normal.  You feel sick to your stomach and vomit.  You have cold skin.  You have blue lips or fingernails.  Your muscles are weak (limp) and your body seems floppy.  The black centers of your eyes (pupils) are smaller than normal. If you think that you or someone else may have taken too much of an opioid medicine, get medical help right away. Call your local emergency services (911 in the [place] Do not drive yourself to the  hospital. If you ever feel like you may hurt yourself or others, or have thoughts about taking your own life, get help right away. You can go to your nearest emergency department or call:  Your local emergency services (911 in the [place]  The hotline of the National Poison Control Center (1-800-222-1222 in the [place]  A suicide crisis helpline, such as the National Suicide Prevention Lifeline at 1-800-273-8255. This is open 24 hours a day. Summary  Opioid are strong medicines that are used to treat bad or very bad pain.  A pain treatment plan is a plan made by you and your doctor. Work with your doctor to make a plan for treating your pain.  Work with your doctor to find other ways to help your pain.  If you   think that you or someone else may have taken too much of an opioid, get help right away. This information is not intended to replace advice given to you by your health care provider. Make sure you discuss any questions you have with your health care provider. Document Revised: 03/09/2019 Document Reviewed: 06/02/2018 Elsevier Patient Education  2021 Elsevier Inc.  

## 2020-06-27 ENCOUNTER — Ambulatory Visit (HOSPITAL_COMMUNITY)
Admission: RE | Admit: 2020-06-27 | Discharge: 2020-06-27 | Disposition: A | Discharge status: Home / Self Care | Payer: Medicare HMO | Source: Ambulatory Visit | Attending: Cardiovascular Disease | Admitting: Cardiovascular Disease

## 2020-06-27 DIAGNOSIS — G459 Transient cerebral ischemic attack, unspecified: Secondary | ICD-10-CM | POA: Diagnosis not present

## 2020-06-27 LAB — CMP14+EGFR
ALT: 8 IU/L (ref 0–32)
AST: 14 IU/L (ref 0–40)
Albumin/Globulin Ratio: 1.6 (ref 1.2–2.2)
Albumin: 3.9 g/dL (ref 3.6–4.6)
Alkaline Phosphatase: 56 IU/L (ref 44–121)
BUN/Creatinine Ratio: 13 (ref 12–28)
BUN: 8 mg/dL (ref 8–27)
Bilirubin Total: 0.4 mg/dL (ref 0.0–1.2)
CO2: 24 mmol/L (ref 20–29)
Calcium: 9.1 mg/dL (ref 8.7–10.3)
Chloride: 107 mmol/L — ABNORMAL HIGH (ref 96–106)
Creatinine, Ser: 0.61 mg/dL (ref 0.57–1.00)
GFR calc Af Amer: 98 mL/min/{1.73_m2} (ref >59)
GFR calc non Af Amer: 85 mL/min/{1.73_m2} (ref >59)
Globulin, Total: 2.4 g/dL (ref 1.5–4.5)
Glucose: 88 mg/dL (ref 65–99)
Potassium: 3.9 mmol/L (ref 3.5–5.2)
Sodium: 141 mmol/L (ref 134–144)
Total Protein: 6.3 g/dL (ref 6.0–8.5)

## 2020-06-27 LAB — LIPID PANEL
Chol/HDL Ratio: 1.9 ratio (ref 0.0–4.4)
Cholesterol, Total: 101 mg/dL (ref 100–199)
HDL: 53 mg/dL (ref >39)
LDL Chol Calc (NIH): 35 mg/dL (ref 0–99)
Triglycerides: 53 mg/dL (ref 0–149)
VLDL Cholesterol Cal: 13 mg/dL (ref 5–40)

## 2020-06-27 LAB — URINE CULTURE

## 2020-06-27 LAB — CBC WITH DIFFERENTIAL/PLATELET
Basophils Absolute: 0 10*3/uL (ref 0.0–0.2)
Basos: 1 %
EOS (ABSOLUTE): 0.1 10*3/uL (ref 0.0–0.4)
Eos: 1 %
Hematocrit: 35.8 % (ref 34.0–46.6)
Hemoglobin: 12.1 g/dL (ref 11.1–15.9)
Immature Grans (Abs): 0 10*3/uL (ref 0.0–0.1)
Immature Granulocytes: 0 %
Lymphocytes Absolute: 3 10*3/uL (ref 0.7–3.1)
Lymphs: 36 %
MCH: 31.7 pg (ref 26.6–33.0)
MCHC: 33.8 g/dL (ref 31.5–35.7)
MCV: 94 fL (ref 79–97)
Monocytes Absolute: 0.9 10*3/uL (ref 0.1–0.9)
Monocytes: 10 %
Neutrophils Absolute: 4.3 10*3/uL (ref 1.4–7.0)
Neutrophils: 52 %
Platelets: 222 10*3/uL (ref 150–450)
RBC: 3.82 x10E6/uL (ref 3.77–5.28)
RDW: 13.2 % (ref 11.7–15.4)
WBC: 8.3 10*3/uL (ref 3.4–10.8)

## 2020-06-29 ENCOUNTER — Other Ambulatory Visit: Payer: Self-pay | Admitting: Nurse Practitioner

## 2020-06-29 DIAGNOSIS — N3000 Acute cystitis without hematuria: Secondary | ICD-10-CM

## 2020-06-29 MED ORDER — AMOXICILLIN-POT CLAVULANATE 875-125 MG PO TABS: 1.0000 | ORAL_TABLET | Freq: Two times a day (BID) | ORAL | 0 refills | Status: DC

## 2020-07-21 ENCOUNTER — Ambulatory Visit (INDEPENDENT_AMBULATORY_CARE_PROVIDER_SITE_OTHER): Payer: Medicare HMO

## 2020-07-21 DIAGNOSIS — G459 Transient cerebral ischemic attack, unspecified: Secondary | ICD-10-CM

## 2020-07-23 LAB — CUP PACEART REMOTE DEVICE CHECK
Date Time Interrogation Session: 20220301003253
Implantable Pulse Generator Implant Date: 20200129

## 2020-07-30 NOTE — Progress Notes (Signed)
Carelink Summary Report / Loop Recorder 

## 2020-07-31 ENCOUNTER — Other Ambulatory Visit: Payer: Self-pay

## 2020-07-31 ENCOUNTER — Other Ambulatory Visit: Payer: Self-pay | Admitting: Nurse Practitioner

## 2020-07-31 ENCOUNTER — Encounter: Payer: Self-pay | Admitting: Nurse Practitioner

## 2020-07-31 ENCOUNTER — Ambulatory Visit (INDEPENDENT_AMBULATORY_CARE_PROVIDER_SITE_OTHER): Payer: Medicare HMO

## 2020-07-31 ENCOUNTER — Ambulatory Visit: Payer: Medicare HMO | Admitting: Nurse Practitioner

## 2020-07-31 VITALS — BP 120/64 | HR 50 | Temp 97.7°F | Resp 20 | Ht 62.0 in | Wt 120.0 lb

## 2020-07-31 DIAGNOSIS — M25559 Pain in unspecified hip: Secondary | ICD-10-CM

## 2020-07-31 MED ORDER — DICLOFENAC SODIUM 1 % EX GEL: 4.0000 g | Freq: Four times a day (QID) | CUTANEOUS | 0 refills | Status: DC

## 2020-07-31 NOTE — Patient Instructions (Signed)
Hip Pain The hip is the joint between the upper legs and the lower pelvis. The bones, cartilage, tendons, and muscles of your hip joint support your body and allow you to move around. Hip pain can range from a minor ache to severe pain in one or both of your hips. The pain may be felt on the inside of the hip joint near the groin, or on the outside near the buttocks and upper thigh. You may also have swelling or stiffness in your hip area. Follow these instructions at home: Managing pain, stiffness, and swelling  If directed, put ice on the painful area. To do this: ? Put ice in a plastic bag. ? Place a towel between your skin and the bag. ? Leave the ice on for 20 minutes, 2-3 times a day.  If directed, apply heat to the affected area as often as told by your health care provider. Use the heat source that your health care provider recommends, such as a moist heat pack or a heating pad. ? Place a towel between your skin and the heat source. ? Leave the heat on for 20-30 minutes. ? Remove the heat if your skin turns bright red. This is especially important if you are unable to feel pain, heat, or cold. You may have a greater risk of getting burned.      Activity  Do exercises as told by your health care provider.  Avoid activities that cause pain. General instructions  Take over-the-counter and prescription medicines only as told by your health care provider.  Keep a journal of your symptoms. Write down: ? How often you have hip pain. ? The location of your pain. ? What the pain feels like. ? What makes the pain worse.  Sleep with a pillow between your legs on your most comfortable side.  Keep all follow-up visits as told by your health care provider. This is important.   Contact a health care provider if:  You cannot put weight on your leg.  Your pain or swelling continues or gets worse after one week.  It gets harder to walk.  You have a fever. Get help right away  if:  You fall.  You have a sudden increase in pain and swelling in your hip.  Your hip is red or swollen or very tender to touch. Summary  Hip pain can range from a minor ache to severe pain in one or both of your hips.  The pain may be felt on the inside of the hip joint near the groin, or on the outside near the buttocks and upper thigh.  Avoid activities that cause pain.  Write down how often you have hip pain, the location of the pain, what makes it worse, and what it feels like. This information is not intended to replace advice given to you by your health care provider. Make sure you discuss any questions you have with your health care provider. Document Revised: 09/18/2018 Document Reviewed: 09/18/2018 Elsevier Patient Education  2021 Elsevier Inc.  

## 2020-07-31 NOTE — Progress Notes (Signed)
   Subjective:    Patient ID: Evelyn [name], female    DOB: 1940/12/24, 80 y.o.   MRN: [id number]   Chief Complaint: Hip Pain (Right hip. Started last Saturday/)   HPI  Pt presents today with c/o right hip pain beginning Saturday morning. Pain is not bad when she is sitting, pain intensifies when she walks. Pain is present as soon as she wakes in the morning. She has been taking her hydrocodone q6hrs which has helped with her pain, but it hasnt taken the pain completley away. She deies any acute injury, falls. She might have "pulled a muscle when getting into her sons truck on Friday night." She has tried a heating pad as well, but it does not help when ambulating. Her pain has worsened over the week. She has not tried any OTC topical treatments.    Review of Systems  Constitutional: Negative for chills and fever.  Respiratory: Negative for cough, shortness of breath and wheezing.   Cardiovascular: Negative for chest pain and palpitations.  Gastrointestinal: Negative for constipation and diarrhea.  Musculoskeletal: Positive for arthralgias. Negative for joint swelling and myalgias.       Objective:   Physical Exam Cardiovascular:     Rate and Rhythm: Normal rate and regular rhythm.     Pulses: Normal pulses.  Pulmonary:     Effort: Pulmonary effort is normal.     Breath sounds: Normal breath sounds.  Abdominal:     General: Abdomen is flat.     Palpations: Abdomen is soft.  Musculoskeletal:     Left hip: No tenderness or bony tenderness. Decreased range of motion. Decreased strength.     Comments: Limp on ambulation  Skin:    General: Skin is warm and dry.     Capillary Refill: Capillary refill takes less than 2 seconds.  Neurological:     Mental Status: She is alert and oriented to person, place, and time.  Psychiatric:        Mood and Affect: Mood normal.        Behavior: Behavior normal.    Vitals:   07/31/20 1201  BP: 120/64  Pulse: (!) 50  Resp: 20  Temp:  97.7 F (36.5 C)  SpO2: 96%       Assessment & Plan:   Evelyn [name] comes in today with chief complaint of Hip Pain (Right hip. Started last Saturday/)   Diagnosis and orders addressed:  1. Hip pain Begin using Voltaren gel. Rest and heating pad for pain relief.   - DG HIP UNILAT W OR W/O PELVIS 2-3 VIEWS RIGHT - diclofenac Sodium (VOLTAREN) 1 % GEL; Apply 4 g topically 4 (four) times daily.  Dispense: 350 g; Refill: 0   Labs pending Health Maintenance reviewed Diet and exercise encouraged  Follow up plan: As scheduled.  Oretha Milch, RN, BSN, FNP-Student  [name], FNP

## 2020-08-04 NOTE — Telephone Encounter (Signed)
Key: [id number] - PA Case ID: [id number]  PA in process for Diclofenac gel

## 2020-08-04 NOTE — Telephone Encounter (Signed)
Your request was denied We have denied coverage or payment under your Medicare Part D benefit for the following prescription drug(s) that you or your prescriber requested: DICLOFENAC SODIUM Gel Why did we deny your request?We denied this request under Medicare Part D because: The information provided by your prescriber did not meet the requirements for covering this medication (prior authorization).  Your plan does not allow coverage of this medication based on your prescriber answering No to the following question(s):Does the patient have osteoarthritis pain in joints susceptible to topical treatment such as feet, ankles, knees,  hands, wrists, or elbows? 

## 2020-08-05 NOTE — Telephone Encounter (Signed)
Insurance will not pay for voltaren gel, but you can buy it OTC

## 2020-08-21 ENCOUNTER — Encounter: Payer: Self-pay | Admitting: Physician Assistant

## 2020-08-21 ENCOUNTER — Other Ambulatory Visit: Payer: Self-pay

## 2020-08-21 ENCOUNTER — Ambulatory Visit: Payer: Medicare HMO | Admitting: Physician Assistant

## 2020-08-21 VITALS — BP 114/60 | HR 52 | Ht 62.0 in | Wt 119.0 lb

## 2020-08-21 DIAGNOSIS — I10 Essential (primary) hypertension: Secondary | ICD-10-CM | POA: Diagnosis not present

## 2020-08-21 DIAGNOSIS — I2510 Atherosclerotic heart disease of native coronary artery without angina pectoris: Secondary | ICD-10-CM

## 2020-08-21 DIAGNOSIS — I480 Paroxysmal atrial fibrillation: Secondary | ICD-10-CM | POA: Diagnosis not present

## 2020-08-21 DIAGNOSIS — Z4509 Encounter for adjustment and management of other cardiac device: Secondary | ICD-10-CM

## 2020-08-21 DIAGNOSIS — Z7901 Long term (current) use of anticoagulants: Secondary | ICD-10-CM | POA: Diagnosis not present

## 2020-08-21 LAB — CUP PACEART INCLINIC DEVICE CHECK
Date Time Interrogation Session: 20220407174517
Implantable Pulse Generator Implant Date: 20200129

## 2020-08-21 NOTE — Progress Notes (Signed)
Cardiology Office Note Date:  08/21/2020  Patient ID:  Evelyn [name], Evelyn [name] 02-Apr-1941, MRN [id number] PCP:  Bennie Pierini, FNP  Cardiologist:  Dr. Royann Shivers Electrophysiologist: Dr. Elberta Fortis   Chief Complaint:  38mo visit  History of Present Illness: Evelyn [name] is a 80 y.o. female with history of CAD (remote PCI > LAD >> CABG), HLD, HTN, TIA > Loop > AFib, ascending aneurysm.   She comes in today to be seen for dr. Elberta Fortis, last seen by him Oct 2021, some degree of SOB, not felt to have been cardiac.  More recently she saw Dr. Royann Shivers in Feb, discussed possibly needed to increase her Eliquis dose 2/2 some weight gain.  AFib burden low, but brief RVR episodes symptomatic, maintained on BB. She did have some night time CP and her Imdur adjusted.  Discussed adding repatha.  Planned for carotid US.  TODAY She is doing well.denies any cardiac concerns, no CP, rare palpitations No dizziness, near syncope or syncope. No bleeding or signs of bleeding No SOB   Device information MDT loop recorder implanted 11/13/2018, TIA  AFib Hx Diagnosed Feb 2020  AAD Hx Multaq, intolerant  Past Medical History:  Diagnosis Date  . Aneurysm (HCC)   . Aortic insufficiency   . Atrial fibrillation (HCC)   . CAD (coronary artery disease)    CABG LIMA to LAD,vein graft to diagonal,vein graft to OM  . Dyslipidemia   . GERD (gastroesophageal reflux disease)   . Hypothyroid     Past Surgical History:  Procedure Laterality Date  . ABDOMINAL HYSTERECTOMY    . BACK SURGERY  2009  . CORONARY ANGIOPLASTY  2003   cutting balloon atherectomy in-stent stenosis of the LAD.  [name] CORONARY ANGIOPLASTY WITH STENT PLACEMENT  2002   remote PCI & stenting LAD,  . CORONARY ARTERY BYPASS GRAFT  2004   LIMA to LAD,vein graft to diagonal,vein graft to OM  . FOOT SURGERY    . LEFT HEART CATH AND CORS/GRAFTS ANGIOGRAPHY N/A 02/23/2017   Procedure: LEFT HEART CATH AND CORS/GRAFTS ANGIOGRAPHY;  Surgeon:  Iran Ouch, MD;  Location: [facility];  Service: Cardiovascular;  Laterality: N/A;  . LEFT HEART CATHETERIZATION WITH CORONARY ANGIOGRAM N/A 12/15/2011   Procedure: LEFT HEART CATHETERIZATION WITH CORONARY ANGIOGRAM;  Surgeon: Thurmon Fair, MD;  Location: [facility];  Service: Cardiovascular;  Laterality: N/A;  . LOOP RECORDER INSERTION N/A 06/14/2018   Procedure: LOOP RECORDER INSERTION;  Surgeon: Thurmon Fair, MD;  Location: [facility];  Service: Cardiovascular;  Laterality: N/A;  . NECK SURGERY    . NM MYOCAR PERF WALL MOTION  12/01/2011   normal study-persistent exercise induced ECG changes raises the concern for ischemia (balanced ischemia).  . NOSE SURGERY    . TONSILLECTOMY  1948  . TUBAL LIGATION      Current Outpatient Medications  Medication Sig Dispense Refill  . apixaban (ELIQUIS) 2.5 MG TABS tablet Take 1 tablet (2.5 mg total) by mouth 2 (two) times daily. 180 tablet 1  . diclofenac Sodium (VOLTAREN) 1 % GEL APPLY 4 G TOPICALLY 4 TIMES DAILY 300 g 0  . diltiazem (CARDIZEM) 30 MG tablet Take 1 tablet (30 mg total) by mouth every 6 (six) hours as needed (for rapid heart rate). 90 tablet 1  . furosemide (LASIX) 20 MG tablet Take 1 tablet (20 mg total) by mouth daily. 90 tablet 1  . HYDROcodone-acetaminophen (NORCO) 5-325 MG tablet Take 1 tablet by mouth every 6 (six) hours  as needed for moderate pain. 120 tablet 0  . hydrocortisone 2.5 % cream APPLY TO AFFECTED AREA TWICE A DAY 28.35 g 5  . isosorbide mononitrate (IMDUR) 60 MG 24 hr tablet TAKE 1 TABLET (60 MG TOTAL) DAILY BY MOUTH. 90 tablet 1  . levothyroxine (SYNTHROID) 75 MCG tablet Take 1 tablet (75 mcg total) by mouth daily before breakfast. 90 tablet 1  . loratadine (CLARITIN) 10 MG tablet Take 1 tablet (10 mg total) by mouth daily. 90 tablet 3  . metoprolol tartrate (LOPRESSOR) 100 MG tablet Take 1 tablet (100 mg total) by mouth 2 (two) times daily. 180 tablet 1  . nitroGLYCERIN (NITROSTAT) 0.4 MG  SL tablet PLACE 1 TABLET (0.4 MG TOTAL) UNDER THE TONGUE EVERY 5 (FIVE) MINUTES AS NEEDED FOR CHEST PAIN. 25 tablet 2  . rosuvastatin (CRESTOR) 10 MG tablet TAKE 1 TABLET BY MOUTH EVERY DAY IN THE EVENING 90 tablet 1  . rosuvastatin (CRESTOR) 20 MG tablet TAKE 1 TABLET BY MOUTH EVERY DAY IN THE EVENING 90 tablet 1  . Zoledronic Acid (RECLAST IV) Inject into the vein. Once yearly     No current facility-administered medications for this visit.    Allergies:   Sulfa antibiotics   Social History:  The patient  reports that she has never smoked. She has never used smokeless tobacco. She reports that she does not drink alcohol and does not use drugs.   Family History:  The patient's family history includes Asthma in her maternal grandmother; Cancer (age of onset: 39) in her sister; Heart attack in her mother; Heart disease in her father and sister; Heart failure in her father; Hypertension in her father; Stroke in her brother, daughter, and son; Voice disorder in her daughter.  ROS:  Please see the history of present illness.    All other systems are reviewed and otherwise negative.   PHYSICAL EXAM:  VS:  BP 114/60   Pulse (!) 52   Ht 5\' 2"  (1.575 m)   Wt 119 lb (54 kg)   SpO2 96%   BMI 21.77 kg/m  BMI: Body mass index is 21.77 kg/m. Well nourished, well developed, in no acute distress HEENT: normocephalic, atraumatic Neck: no JVD, carotid bruits or masses Cardiac:  RRR; no significant murmurs, no rubs, or gallops Lungs:  CTA b/l, no wheezing, rhonchi or rales Abd: soft, nontender MS: no deformity or atrophy Ext: no edema Skin: warm and dry, no rash Neuro:  No gross deficits appreciated Psych: euthymic mood, full affect  ILR site is stable, no tethering or discomfort   EKG:  Not done today  Device interrogation done today and reviewed by myself:  Battery is good 9 tachy (afib) 1 AFib Burden <0.1%  Last Dec 2021, longest 12-12-1987   06/27/2020: Carotid 08/25/2020 Summary:  Right  Carotid: The extracranial vessels were near-normal with only minimal  wall         thickening or plaque.   Left Carotid: The extracranial vessels were near-normal with only minimal  wall        thickening or plaque.   Vertebrals: Bilateral vertebral arteries demonstrate antegrade flow.  Subclavians: Normal flow hemodynamics were seen in bilateral subclavian        arteries.          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    TTE 2018  Review  of the above records today demonstrates:  - Left ventricle: The cavity size was normal. Systolic function was normal. The estimated ejection fraction was in the range of 55% to 60%. Mild hypokinesis of the inferior and inferoseptal myocardium. Doppler parameters are consistent with abnormal left ventricular relaxation (grade 1 diastolic dysfunction). Doppler parameters are consistent with indeterminate ventricular filling pressure. - Aortic valve: Transvalvular velocity was within the normal range. There was no stenosis. There was mild regurgitation. - Aorta: Ascending aortic diameter: 37 mm (S). - Ascending aorta: The ascending aorta was mildly dilated. - Mitral valve: Transvalvular velocity was within the normal range. There was no evidence for stenosis. There was mild regurgitation. - Right ventricle: The cavity size was normal. Wall thickness was normal. Systolic function was normal. - Atrial septum: No defect or patent foramen ovale was identified by color flow Doppler or saline microcavitation study. - Tricuspid valve: There was moderate regurgitation. - Pulmonary arteries: Systolic pressure was within the normal range. PA peak pressure: 27 mm Hg (S).  LHC 2018  The left ventricular systolic function is normal.  LV end diastolic pressure is mildly elevated.  The  left ventricular ejection fraction is 55-65% by visual estimate.  Ost LAD to Mid LAD lesion, 100 %stenosed.  Ost Cx lesion, 20 %stenosed.  LM lesion, 20 %stenosed.  Prox RCA lesion, 20 %stenosed.  SVG and is normal in caliber and anatomically normal.  LIMA and is normal in caliber and anatomically normal.    Recent Labs: 03/25/2020: TSH 0.310 06/26/2020: ALT 8; BUN 8; Creatinine, Ser 0.61; Hemoglobin 12.1; Platelets 222; Potassium 3.9; Sodium 141  06/26/2020: Chol/HDL Ratio 1.9; Cholesterol, Total 101; HDL 53; LDL Chol Calc (NIH) 35; Triglycerides 53   CrCl cannot be calculated (Patient's most recent lab result is older than the maximum 21 days allowed.).   Wt Readings from Last 3 Encounters:  08/21/20 119 lb (54 kg)  07/31/20 120 lb (54.4 kg)  06/26/20 119 lb (54 kg)     Other studies reviewed: Additional studies/records reviewed today include: summarized above  ASSESSMENT AND PLAN:  1. Paroxysmal AFib     CHA2DS2Vasc is 7, on eliquis, appropriately dosed for age/weight      <0.1% burden  2. CAD     No anginal symptoms     On Nitrate, BB statin, no ASA w/Eliquis  3. HTN     Looks OK  4. HLD     Not addressed today     Deferred to Dr. Royann Shivers, looks like some consideration for repatha    Disposition: F/u with monthly remotes as usual.  Discused follow up PRN with EP, though she would like to keep annual visits, will see her sooner if needed.  Current medicines are reviewed at length with the patient today.  The patient did not have any concerns regarding medicines.  Norma Fredrickson, PA-C 08/21/2020 5:36 PM     [facility] [address] [place] [address] [phone] (office)  [phone] (fax)

## 2020-08-21 NOTE — Patient Instructions (Addendum)
Medication Instructions:  ° ° °Your physician recommends that you continue on your current medications as directed. Please refer to the Current Medication list given to you today. ° °*If you need a refill on your cardiac medications before your next appointment, please call your pharmacy* ° ° °Lab Work: NONE ORDERED  TODAY ° ° ° °If you have labs (blood work) drawn today and your tests are completely normal, you will receive your results only by: °• MyChart Message (if you have MyChart) OR °• A paper copy in the mail °If you have any lab test that is abnormal or we need to change your treatment, we will call you to review the results. ° ° °Testing/Procedures:NONE ORDERED  TODAY ° ° ° ° °Follow-Up: °At [facility], you and your health needs are our priority.  As part of our continuing mission to provide you with exceptional heart care, we have created designated Provider Care Teams.  These Care Teams include your primary Cardiologist (physician) and Advanced Practice Providers (APPs -  Physician Assistants and Nurse Practitioners) who all work together to provide you with the care you need, when you need it. ° °We recommend signing up for the patient portal called "MyChart".  Sign up information is provided on this After Visit Summary.  MyChart is used to connect with patients for Virtual Visits (Telemedicine).  Patients are able to view lab/test results, encounter notes, upcoming appointments, etc.  Non-urgent messages can be sent to your provider as well.   °To learn more about what you can do with MyChart, go to [email or web address].   ° °Your next appointment:   °1 year(s) ° °The format for your next appointment:   °In Person ° °Provider:   °You may see Will Hendryx Camnitz, MD or one of the following Advanced Practice Providers on your designated Care Team:   °· Amber Seiler, NP °· Renee Ursuy, PA-C °· Michael "Andy" Tillery, PA-C ° ° ° °Other Instructions ° ° °

## 2020-08-23 LAB — CUP PACEART REMOTE DEVICE CHECK
Date Time Interrogation Session: 20220403013653
Implantable Pulse Generator Implant Date: 20200129

## 2020-08-25 ENCOUNTER — Ambulatory Visit (INDEPENDENT_AMBULATORY_CARE_PROVIDER_SITE_OTHER): Payer: Medicare HMO

## 2020-08-25 DIAGNOSIS — G459 Transient cerebral ischemic attack, unspecified: Secondary | ICD-10-CM

## 2020-08-27 ENCOUNTER — Telehealth: Payer: Self-pay

## 2020-08-27 NOTE — Telephone Encounter (Signed)
Please let atinet know that she can get voltaren gel OTC since insurance denied.

## 2020-08-27 NOTE — Telephone Encounter (Signed)
Patient aware and verbalizes understanding. 

## 2020-08-27 NOTE — Telephone Encounter (Signed)
Prior authorization for Voltaren Gel submitted and denied  Additional Information Required [facility] was not able to process the request because the previous Prior Authorization Request was Denied, please submit an electronic Appeal Request. You can also contact the plan at [phone] or fax in request to [phone].  Drug Diclofenac Sodium 1% gel

## 2020-09-08 ENCOUNTER — Telehealth: Payer: Self-pay | Admitting: *Deleted

## 2020-09-08 NOTE — Telephone Encounter (Signed)
previous Prior Authorization Request was Denied  Will not cover = please let pt know of alternative to try

## 2020-09-09 NOTE — Progress Notes (Signed)
Carelink Summary Report / Loop Recorder 

## 2020-09-09 NOTE — Telephone Encounter (Signed)
Was this for voltaren gel- please let her know you can get it OTC

## 2020-09-09 NOTE — Telephone Encounter (Signed)
Evelyn Muir, do you remember if this was for Voltaren gel?  There is another telephone note in the chart about that so we're thinking it may be.

## 2020-09-10 NOTE — Telephone Encounter (Signed)
Was voltaren - pt was called 4/13- aware

## 2020-09-23 ENCOUNTER — Encounter: Payer: Self-pay | Admitting: Nurse Practitioner

## 2020-09-23 ENCOUNTER — Ambulatory Visit: Payer: Medicare HMO | Admitting: Nurse Practitioner

## 2020-09-23 ENCOUNTER — Other Ambulatory Visit: Payer: Self-pay

## 2020-09-23 VITALS — BP 134/66 | HR 51 | Temp 98.1°F | Resp 20 | Ht 62.0 in | Wt 120.0 lb

## 2020-09-23 DIAGNOSIS — M5412 Radiculopathy, cervical region: Secondary | ICD-10-CM

## 2020-09-23 DIAGNOSIS — L989 Disorder of the skin and subcutaneous tissue, unspecified: Secondary | ICD-10-CM

## 2020-09-23 DIAGNOSIS — M5030 Other cervical disc degeneration, unspecified cervical region: Secondary | ICD-10-CM | POA: Diagnosis not present

## 2020-09-23 DIAGNOSIS — M542 Cervicalgia: Secondary | ICD-10-CM

## 2020-09-23 DIAGNOSIS — R52 Pain, unspecified: Secondary | ICD-10-CM

## 2020-09-23 MED ORDER — HYDROCODONE-ACETAMINOPHEN 5-325 MG PO TABS: 1.0000 | ORAL_TABLET | Freq: Four times a day (QID) | ORAL | 0 refills | Status: DC | PRN

## 2020-09-23 NOTE — Progress Notes (Signed)
Subjective:    Patient ID: Evelyn [name], female    DOB: 25-Jan-1941, 80 y.o.   MRN: [id number]   Chief Complaint: pain management  HPI Patient come sin today for pain management. She  Was seen in February for chronic follow up. All labs were within normal limits. Today she is here for pain management Pain assessment: Cause of pain- DDD Pain location- lower back Pain on scale of 1-10- 1/10 currently Frequency- daily What increases pain-to much activity What makes pain Better-rest helps Effects on ADL - none Any change in general medical condition-none  Current opioids rx- norco 5/325 takes prn- sh edoes not take everyday and some days she takes 2-3 times. # meds rx- 120 Effectiveness of current meds-helps Adverse reactions from pain meds-none Morphine equivalent-  Pill count performed-No Last drug screen - 03/25/20 ( high risk q52m, moderate risk q62m, low risk yearly ) Urine drug screen today- No Was the NCCSR reviewed- yes  If yes were their any concerning findings? - no  Overdose risk: 1  Pain contract signed on: 09/23/20   - She has a lesion on right lower leg that is now sore to touch and is draining.  Review of Systems  Constitutional: Negative for diaphoresis.  Eyes: Negative for pain.  Respiratory: Negative for shortness of breath.   Cardiovascular: Negative for chest pain, palpitations and leg swelling.  Gastrointestinal: Negative for abdominal pain.  Endocrine: Negative for polydipsia.  Musculoskeletal: Positive for back pain.  Skin: Negative for rash.  Neurological: Negative for dizziness, weakness and headaches.  Hematological: Does not bruise/bleed easily.  All other systems reviewed and are negative.      Objective:   Physical Exam Vitals and nursing note reviewed.  Constitutional:      Appearance: Normal appearance.  Cardiovascular:     Rate and Rhythm: Normal rate and regular rhythm.     Heart sounds: Normal heart sounds.  Pulmonary:      Effort: Pulmonary effort is normal.     Breath sounds: Normal breath sounds.  Skin:    General: Skin is warm.     Comments: 2cm raised erythematous scaley lesion on right lower leg  Neurological:     General: No focal deficit present.     Mental Status: She is alert and oriented to person, place, and time.  Psychiatric:        Mood and Affect: Mood normal.        Behavior: Behavior normal.    BP 134/66   Pulse (!) 51   Temp 98.1 F (36.7 C) (Temporal)   Resp 20   Ht 5\' 2"  (1.575 m)   Wt 120 lb (54.4 kg)   SpO2 95%   BMI 21.95 kg/m         Assessment & Plan:  in today with chief complaint of Pain Management   1. DDD (degenerative disc disease), cervical - HYDROcodone-acetaminophen (NORCO) 5-325 MG tablet; Take 1 tablet by mouth every 6 (six) hours as needed for moderate pain.  Dispense: 120 tablet; Refill: 0  2. Cervical neuropathic pain  3. Cervical pain (neck)  4. Pain management  5. Skin lesion of lower extremity Do not pick or scratch at lesion - Ambulatory referral to Dermatology    The above assessment and management plan was discussed with the patient. The patient verbalized understanding of and has agreed to the management plan. Patient is aware to call the clinic if symptoms persist or worsen. Patient is  aware when to return to the clinic for a follow-up visit. Patient educated on when it is appropriate to go to the emergency department.   Mary-Margaret Hassell Done, FNP

## 2020-09-24 ENCOUNTER — Telehealth: Payer: Self-pay

## 2020-09-24 DIAGNOSIS — M5030 Other cervical disc degeneration, unspecified cervical region: Secondary | ICD-10-CM

## 2020-09-25 MED ORDER — HYDROCODONE-ACETAMINOPHEN 5-325 MG PO TABS
1.0000 | ORAL_TABLET | Freq: Four times a day (QID) | ORAL | 0 refills | Status: DC | PRN
Start: 2020-09-25 — End: 2020-12-24

## 2020-09-25 NOTE — Telephone Encounter (Signed)
Contacted [facility] and notified them to cancel rx for hydrocodone

## 2020-09-29 ENCOUNTER — Ambulatory Visit (INDEPENDENT_AMBULATORY_CARE_PROVIDER_SITE_OTHER): Payer: Medicare HMO

## 2020-09-29 DIAGNOSIS — G459 Transient cerebral ischemic attack, unspecified: Secondary | ICD-10-CM

## 2020-09-30 LAB — CUP PACEART REMOTE DEVICE CHECK
Date Time Interrogation Session: 20220514231133
Implantable Pulse Generator Implant Date: 20200129

## 2020-10-22 NOTE — Progress Notes (Signed)
Carelink Summary Report / Loop Recorder 

## 2020-10-29 ENCOUNTER — Telehealth: Payer: Self-pay | Admitting: "Endocrinology

## 2020-10-29 NOTE — Telephone Encounter (Signed)
Pt is scheduled for a 1 year follow up next week, but does not look like she has been sch for her [place]. Can you check

## 2020-10-29 NOTE — Telephone Encounter (Signed)
Scheduled pt for thyroid ultrasound June 22 at 3:30, arrive at [facility] at 3:15p.m. Pt made aware.

## 2020-11-03 ENCOUNTER — Ambulatory Visit (INDEPENDENT_AMBULATORY_CARE_PROVIDER_SITE_OTHER): Payer: Medicare HMO

## 2020-11-03 DIAGNOSIS — G459 Transient cerebral ischemic attack, unspecified: Secondary | ICD-10-CM | POA: Diagnosis not present

## 2020-11-03 LAB — CUP PACEART REMOTE DEVICE CHECK
Date Time Interrogation Session: 20220616232149
Implantable Pulse Generator Implant Date: 20200129

## 2020-11-05 ENCOUNTER — Other Ambulatory Visit: Payer: Self-pay

## 2020-11-05 ENCOUNTER — Ambulatory Visit (HOSPITAL_COMMUNITY)
Admission: RE | Admit: 2020-11-05 | Discharge: 2020-11-05 | Disposition: A | Discharge status: Home / Self Care | Payer: Medicare HMO | Source: Ambulatory Visit | Attending: "Endocrinology | Admitting: "Endocrinology

## 2020-11-05 DIAGNOSIS — E042 Nontoxic multinodular goiter: Secondary | ICD-10-CM | POA: Insufficient documentation

## 2020-11-06 ENCOUNTER — Ambulatory Visit: Payer: Medicare HMO | Admitting: "Endocrinology

## 2020-11-07 ENCOUNTER — Other Ambulatory Visit: Payer: Medicare HMO

## 2020-11-07 ENCOUNTER — Other Ambulatory Visit: Payer: Self-pay | Admitting: "Endocrinology

## 2020-11-07 ENCOUNTER — Other Ambulatory Visit: Payer: Self-pay

## 2020-11-07 DIAGNOSIS — E039 Hypothyroidism, unspecified: Secondary | ICD-10-CM

## 2020-11-08 LAB — T4, FREE: Free T4: 1.44 ng/dL (ref 0.82–1.77)

## 2020-11-08 LAB — TSH: TSH: 1.5 u[IU]/mL (ref 0.450–4.500)

## 2020-11-10 ENCOUNTER — Other Ambulatory Visit: Payer: Self-pay

## 2020-11-10 ENCOUNTER — Encounter: Payer: Self-pay | Admitting: "Endocrinology

## 2020-11-10 ENCOUNTER — Ambulatory Visit: Payer: Medicare HMO | Admitting: "Endocrinology

## 2020-11-10 VITALS — BP 120/58 | HR 52 | Ht 62.0 in | Wt 120.6 lb

## 2020-11-10 DIAGNOSIS — E042 Nontoxic multinodular goiter: Secondary | ICD-10-CM

## 2020-11-10 DIAGNOSIS — E039 Hypothyroidism, unspecified: Secondary | ICD-10-CM | POA: Diagnosis not present

## 2020-11-10 MED ORDER — LEVOTHYROXINE SODIUM 75 MCG PO TABS: 75.0000 ug | ORAL_TABLET | Freq: Every day | ORAL | 3 refills | Status: DC

## 2020-11-10 NOTE — Progress Notes (Signed)
11/10/2020      Endocrinology follow-up note   Subjective:    Patient ID: Evelyn [name], female    DOB: [birth year], PCP Bennie Pierini, FNP   Past Medical History:  Diagnosis Date   Aneurysm [facility])    Aortic insufficiency    Atrial fibrillation (HCC)    CAD (coronary artery disease)    CABG LIMA to LAD,vein graft to diagonal,vein graft to OM   Dyslipidemia    GERD (gastroesophageal reflux disease)    Hypothyroid    Past Surgical History:  Procedure Laterality Date   ABDOMINAL HYSTERECTOMY     BACK SURGERY  2009   CORONARY ANGIOPLASTY  2003   cutting balloon atherectomy in-stent stenosis of the LAD.   CORONARY ANGIOPLASTY WITH STENT PLACEMENT  2002   remote PCI & stenting LAD,   CORONARY ARTERY BYPASS GRAFT  2004   LIMA to LAD,vein graft to diagonal,vein graft to OM   FOOT SURGERY     LEFT HEART CATH AND CORS/GRAFTS ANGIOGRAPHY N/A 02/23/2017   Procedure: LEFT HEART CATH AND CORS/GRAFTS ANGIOGRAPHY;  Surgeon: Iran Ouch, MD;  Location: [facility];  Service: Cardiovascular;  Laterality: N/A;   LEFT HEART CATHETERIZATION WITH CORONARY ANGIOGRAM N/A 12/15/2011   Procedure: LEFT HEART CATHETERIZATION WITH CORONARY ANGIOGRAM;  Surgeon: Thurmon Fair, MD;  Location: [facility] CATH [facility];  Service: Cardiovascular;  Laterality: N/A;   LOOP RECORDER INSERTION N/A 06/14/2018   Procedure: LOOP RECORDER INSERTION;  Surgeon: Thurmon Fair, MD;  Location: [facility];  Service: Cardiovascular;  Laterality: N/A;   NECK SURGERY     NM MYOCAR PERF WALL MOTION  12/01/2011   normal study-persistent exercise induced ECG changes raises the concern for ischemia (balanced ischemia).   NOSE SURGERY     PACEMAKER INSERTION     TONSILLECTOMY  1948   TUBAL LIGATION     Social History   Socioeconomic History   Marital status: Widowed    Spouse name: Not on file   Number of children: 2   Years of education: Associates   Highest education level: Associate degree:  academic program  Occupational History   Occupation: Retired  Tobacco Use   Smoking status: Never   Smokeless tobacco: Never  Building services engineer Use: Never used  Substance and Sexual Activity   Alcohol use: No   Drug use: No   Sexual activity: Not Currently  Other Topics Concern   Not on file  Social History Narrative   Mother of 2 with 4 grandchildren   Right-handed   Caffeine: 8-10 cups coffee   Social Determinants of Health   Financial Resource Strain: Not on file  Food Insecurity: Not on file  Transportation Needs: Not on file  Physical Activity: Not on file  Stress: Not on file  Social Connections: Not on file   Outpatient Encounter Medications as of 11/10/2020  Medication Sig   apixaban (ELIQUIS) 2.5 MG TABS tablet Take 1 tablet (2.5 mg total) by mouth 2 (two) times daily.   diclofenac Sodium (VOLTAREN) 1 % GEL APPLY 4 G TOPICALLY 4 TIMES DAILY   diltiazem (CARDIZEM) 30 MG tablet Take 1 tablet (30 mg total) by mouth every 6 (six) hours as needed (for rapid heart rate).   furosemide (LASIX) 20 MG tablet Take 1 tablet (20 mg total) by mouth daily.   HYDROcodone-acetaminophen (NORCO) 5-325 MG tablet Take 1 tablet by mouth every 6 (six) hours as needed for moderate pain.   hydrocortisone 2.5 %  cream APPLY TO AFFECTED AREA TWICE A DAY   isosorbide mononitrate (IMDUR) 60 MG 24 hr tablet TAKE 1 TABLET (60 MG TOTAL) DAILY BY MOUTH.   levothyroxine (SYNTHROID) 75 MCG tablet Take 1 tablet (75 mcg total) by mouth daily before breakfast.   loratadine (CLARITIN) 10 MG tablet Take 1 tablet (10 mg total) by mouth daily.   metoprolol tartrate (LOPRESSOR) 100 MG tablet Take 1 tablet (100 mg total) by mouth 2 (two) times daily.   nitroGLYCERIN (NITROSTAT) 0.4 MG SL tablet PLACE 1 TABLET (0.4 MG TOTAL) UNDER THE TONGUE EVERY 5 (FIVE) MINUTES AS NEEDED FOR CHEST PAIN.   rosuvastatin (CRESTOR) 10 MG tablet TAKE 1 TABLET BY MOUTH EVERY DAY IN THE EVENING   rosuvastatin (CRESTOR) 20 MG  tablet TAKE 1 TABLET BY MOUTH EVERY DAY IN THE EVENING   Zoledronic Acid (RECLAST IV) Inject into the vein. Once yearly   [DISCONTINUED] levothyroxine (SYNTHROID) 75 MCG tablet Take 1 tablet (75 mcg total) by mouth daily before breakfast.   No facility-administered encounter medications on file as of 11/10/2020.   ALLERGIES: Allergies  Allergen Reactions   Sulfa Antibiotics Swelling   VACCINATION STATUS: Immunization History  Administered Date(s) Administered   Fluad Quad(high Dose 65+) 02/29/2020   Influenza, High Dose Seasonal PF 03/09/2016, 03/02/2017   Moderna Sars-Covid-2 Vaccination 01/02/2020, 01/30/2020   Pneumococcal Conjugate-13 01/11/2018    HPI  80 year old female with medical history as above. She is being seen in follow-up for management of her hypothyroidism and multinodular goiter.  She remains on stable dose of levothyroxine 75 mcg p.o. daily before breakfast.  She is compliant and has no side effects.  She has no new complaints today.    - She is status post FNA 3 nodules in her thyroid benign findings.  Her previsit surveillance thyroid/neck ultrasound shows stable findings with no concerning nodules at this time.    -She was recently diagnosed with acute coronary syndrome on medical management. -Her previsit labs are consistent with appropriate replacement.   - She denies family history of thyroid dysfunction. She feels occasional choking sensation in her neck, no recent voice change nor shortness of breath. She denies any exposure to neck radiation. - She denies any history of smoking.  Review of Systems Limited as above.  Objective:    BP (!) 120/58   Pulse (!) 52   Ht 5\' 2"  (1.575 m)   Wt 120 lb 9.6 oz (54.7 kg)   BMI 22.06 kg/m   Wt Readings from Last 3 Encounters:  11/10/20 120 lb 9.6 oz (54.7 kg)  09/23/20 120 lb (54.4 kg)  08/21/20 119 lb (54 kg)    Physical Exam   CMP     Component Value Date/Time   NA 141 06/26/2020 1116   K 3.9  06/26/2020 1116   CL 107 (H) 06/26/2020 1116   CO2 24 06/26/2020 1116   GLUCOSE 88 06/26/2020 1116   GLUCOSE 94 03/28/2019 1104   BUN 8 06/26/2020 1116   CREATININE 0.61 06/26/2020 1116   CREATININE 0.59 07/11/2014 0913   CALCIUM 9.1 06/26/2020 1116   PROT 6.3 06/26/2020 1116   ALBUMIN 3.9 06/26/2020 1116   AST 14 06/26/2020 1116   ALT 8 06/26/2020 1116   ALKPHOS 56 06/26/2020 1116   BILITOT 0.4 06/26/2020 1116   GFRNONAA 85 06/26/2020 1116   GFRAA 98 06/26/2020 1116   Recent Results (from the past 2160 hour(s))  CUP PACEART REMOTE DEVICE CHECK     Status: None  Collection Time: 08/17/20  1:36 AM  Result Value Ref Range   Date Time Interrogation Session [id number]    Pulse Generator Manufacturer MERM    Pulse Gen Model JOA41 Reveal LINQ    Pulse Gen Serial Number [id number]    Clinic Name [facility]    Implantable Pulse Generator Type ICM/ILR    Implantable Pulse Generator Implant Date [id number]    Eval Rhythm SB at 55 bpm   CUP PACEART INCLINIC DEVICE CHECK     Status: None   Collection Time: 08/21/20  5:45 PM  Result Value Ref Range   Pulse Generator Manufacturer MERM    Date Time Interrogation Session [id number]    Pulse Gen Model [id number] Reveal LINQ    Pulse Gen Serial Number [id number]    Clinic Name [facility]    Implantable Pulse Generator Type ICM/ILR    Implantable Pulse Generator Implant Date [id number]    Eval Rhythm SR   CUP PACEART REMOTE DEVICE CHECK     Status: None   Collection Time: 09/27/20 11:11 PM  Result Value Ref Range   Date Time Interrogation Session [id number]    Pulse Generator Manufacturer MERM    Pulse Gen Model [id number] Reveal LINQ    Pulse Gen Serial Number [id number]    Clinic Name [facility]    Implantable Pulse Generator Type ICM/ILR    Implantable Pulse Generator Implant Date [id number]   CUP PACEART REMOTE DEVICE CHECK     Status: None   Collection Time: 10/30/20 11:21 PM  Result Value Ref Range   Date Time  Interrogation Session [id number]    Pulse Generator Manufacturer MERM    Pulse Gen Model JIR67 Reveal LINQ    Pulse Gen Serial Number [id number]    Clinic Name [facility]    Implantable Pulse Generator Type ICM/ILR    Implantable Pulse Generator Implant Date [id number]   TSH     Status: None   Collection Time: 11/07/20  3:28 PM  Result Value Ref Range   TSH 1.500 0.450 - 4.500 uIU/mL  T4, Free     Status: None   Collection Time: 11/07/20  3:28 PM  Result Value Ref Range   Free T4 1.44 0.82 - 1.77 ng/dL   Fine-needle aspiration samples  of 3 nodules in her thyroid is benign  Repeat thyroid ultrasound on April 18, 2019: Right lobe 4 cm x 1.9 mm x 2.6 cm, left lobe 4.1 cm x 1.5 cm x 2.3 cm  There are small stable nodules 3 on the right and one on the left.  2 right-sided nodules and one nodule on the left were previously biopsied with benign findings.   Assessment & Plan:   1.  hypothyroidism 2. Multinodular goiter  -Her previsit thyroid function tests are consistent with appropriate replacement.  She is advised to continue levothyroxine 75 mcg p.o. daily before breakfast.      - We discussed about the correct intake of her thyroid hormone, on empty stomach at fasting, with water, separated by at least 30 minutes from breakfast and other medications,  and separated by more than 4 hours from calcium, iron, multivitamins, acid reflux medications (PPIs). -Patient is made aware of the fact that thyroid hormone replacement is needed for life, dose to be adjusted by periodic monitoring of thyroid function tests.    -  Prior Biopsy off  3 nodules is negative for malignancy.  She had stable findings on her surveillance thyroid ultrasound-right thyroid nodule has decreased in  size with interval cystic changes which downgrades it from TR 4 to TR 2 category.  She will not need intervention at this time.     - I advised patient to maintain close follow up with Bennie Pierini,  FNP for primary care needs.    I spent 22 minutes in the care of the patient today including review of labs from Thyroid Function, CMP, and other relevant labs ; imaging/biopsy records (current and previous including abstractions from other facilities); face-to-face time discussing  her lab results and symptoms, medications doses, her options of short and long term treatment based on the latest standards of care / guidelines;   and documenting the encounter.  Evelyn [name]  participated in the discussions, expressed understanding, and voiced agreement with the above plans.  All questions were answered to her satisfaction. she is encouraged to contact clinic should she have any questions or concerns prior to her return visit.    Follow up plan: Return in about 1 year (around 11/10/2021) for F/U with Pre-visit Labs.  Marquis Lunch, MD Phone: [phone]  Fax: [phone]  -  This note was partially dictated with voice recognition software. Similar sounding words can be transcribed inadequately or may not  be corrected upon review.  11/10/2020, 3:41 PM

## 2020-11-12 ENCOUNTER — Other Ambulatory Visit: Payer: Self-pay | Admitting: Nurse Practitioner

## 2020-11-12 DIAGNOSIS — L309 Dermatitis, unspecified: Secondary | ICD-10-CM

## 2020-11-24 NOTE — Progress Notes (Signed)
Carelink Summary Report / Loop Recorder 

## 2020-12-02 ENCOUNTER — Ambulatory Visit: Payer: Medicare HMO | Admitting: Dermatology

## 2020-12-04 LAB — CUP PACEART REMOTE DEVICE CHECK
Date Time Interrogation Session: 20220719233251
Implantable Pulse Generator Implant Date: 20200129

## 2020-12-08 ENCOUNTER — Ambulatory Visit (INDEPENDENT_AMBULATORY_CARE_PROVIDER_SITE_OTHER): Payer: Medicare HMO

## 2020-12-08 DIAGNOSIS — G459 Transient cerebral ischemic attack, unspecified: Secondary | ICD-10-CM

## 2020-12-24 ENCOUNTER — Encounter: Payer: Self-pay | Admitting: Nurse Practitioner

## 2020-12-24 ENCOUNTER — Other Ambulatory Visit: Payer: Self-pay

## 2020-12-24 ENCOUNTER — Ambulatory Visit: Payer: Medicare HMO | Admitting: Nurse Practitioner

## 2020-12-24 VITALS — BP 143/63 | HR 53 | Temp 98.0°F | Resp 20 | Ht 62.0 in | Wt 121.0 lb

## 2020-12-24 DIAGNOSIS — I10 Essential (primary) hypertension: Secondary | ICD-10-CM

## 2020-12-24 DIAGNOSIS — I25728 Atherosclerosis of autologous artery coronary artery bypass graft(s) with other forms of angina pectoris: Secondary | ICD-10-CM | POA: Diagnosis not present

## 2020-12-24 DIAGNOSIS — E039 Hypothyroidism, unspecified: Secondary | ICD-10-CM

## 2020-12-24 DIAGNOSIS — E782 Mixed hyperlipidemia: Secondary | ICD-10-CM

## 2020-12-24 DIAGNOSIS — I25718 Atherosclerosis of autologous vein coronary artery bypass graft(s) with other forms of angina pectoris: Secondary | ICD-10-CM

## 2020-12-24 DIAGNOSIS — I480 Paroxysmal atrial fibrillation: Secondary | ICD-10-CM

## 2020-12-24 DIAGNOSIS — F419 Anxiety disorder, unspecified: Secondary | ICD-10-CM

## 2020-12-24 DIAGNOSIS — M5030 Other cervical disc degeneration, unspecified cervical region: Secondary | ICD-10-CM

## 2020-12-24 MED ORDER — HYDROCODONE-ACETAMINOPHEN 5-325 MG PO TABS
1.0000 | ORAL_TABLET | Freq: Four times a day (QID) | ORAL | 0 refills | Status: DC | PRN
Start: 2021-02-22 — End: 2021-03-27

## 2020-12-24 MED ORDER — METOPROLOL TARTRATE 100 MG PO TABS: 100.0000 mg | ORAL_TABLET | Freq: Two times a day (BID) | ORAL | 1 refills | Status: DC

## 2020-12-24 MED ORDER — ISOSORBIDE MONONITRATE ER 60 MG PO TB24
ORAL_TABLET | ORAL | 1 refills | Status: DC
Start: 2020-12-24 — End: 2021-06-25

## 2020-12-24 MED ORDER — LEVOTHYROXINE SODIUM 75 MCG PO TABS: 75.0000 ug | ORAL_TABLET | Freq: Every day | ORAL | 3 refills | Status: DC

## 2020-12-24 MED ORDER — APIXABAN 2.5 MG PO TABS: 2.5000 mg | ORAL_TABLET | Freq: Two times a day (BID) | ORAL | 1 refills | Status: DC

## 2020-12-24 MED ORDER — HYDROCODONE-ACETAMINOPHEN 5-325 MG PO TABS: 1.0000 | ORAL_TABLET | Freq: Four times a day (QID) | ORAL | 0 refills | Status: AC | PRN

## 2020-12-24 MED ORDER — DILTIAZEM HCL 30 MG PO TABS: 30.0000 mg | ORAL_TABLET | Freq: Four times a day (QID) | ORAL | 1 refills | Status: DC | PRN

## 2020-12-24 MED ORDER — FUROSEMIDE 20 MG PO TABS: 20.0000 mg | ORAL_TABLET | Freq: Every day | ORAL | 1 refills | Status: DC

## 2020-12-24 MED ORDER — ROSUVASTATIN CALCIUM 20 MG PO TABS: ORAL_TABLET | ORAL | 1 refills | Status: DC

## 2020-12-24 NOTE — Progress Notes (Signed)
Subjective:    Patient ID: Evelyn [name], female    DOB: [birth year], 80 y.o.   MRN: [id number]   Chief Complaint: Medical Management of Chronic Issues    HPI:  1. Essential hypertension No c/o chest pain, sob or headache. Does not check blood pressure at home. BP Readings from Last 3 Encounters:  12/24/20 (!) 143/63  11/10/20 (!) 120/58  09/23/20 134/66     2. 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 Has done well since then. Denies any chest pain. Last saw cardiology on 08/21/20. According to office note, no chenages were made to plan od care.  3. Paroxysmal atrial fibrillation (HCC) Denies any palpitations or heart racing  4. Acquired hypothyroidism No problems that she is aware of. Lab Results  Component Value Date   TSH 1.500 11/07/2020     5. DDD (degenerative disc disease), cervical Pain assessment: Cause of pain- DDD Pain location- low back and neck at tomes Pain on scale of 1-10- 7/10 Frequency- daily What increases pain-nothing she is aware of What makes pain Better-rest helps Effects on ADL - none Any change in general medical condition-none  Current opioids rx- norco 5/325 QID # meds rx- 120 Effectiveness of current meds-helps Adverse reactions from pain meds-none Morphine equivalent- 20MME  Pill count performed-No Last drug screen - 03/25/20 ( high risk q68m moderate risk q675mlow risk yearly ) Urine drug screen today- No [place]  If yes were their any concerning findings? - no   Overdose risk: 1   Pain contract signed on: 09/24/20   6. Anxiety Has anxiety and is doing well. Cannot take anxiety meds due to pain meds GAD 7 : Generalized Anxiety Score 12/24/2020 09/23/2020 06/26/2020 12/26/2019  Nervous, Anxious, on Edge 0 0 0 0  Control/stop worrying 0 0 - 0  Worry too much - different things 0 0 0 0  Trouble relaxing 0 0 1 0   Restless 0 0 0 0  Easily annoyed or irritable 0 0 0 -  Afraid - awful might happen 0 0 0 0  Total GAD 7 Score 0 0 - -  Anxiety Difficulty Not difficult at all Not difficult at all Not difficult at all Not difficult at all       7. Mixed hyperlipidemia Does not watch diet and does very little exercise. Lab Results  Component Value Date   CHOL 101 06/26/2020   HDL 53 06/26/2020   LDLCALC 35 06/26/2020   TRIG 53 06/26/2020   CHOLHDL 1.9 06/26/2020       Outpatient Encounter Medications as of 12/24/2020  Medication Sig   apixaban (ELIQUIS) 2.5 MG TABS tablet Take 1 tablet (2.5 mg total) by mouth 2 (two) times daily.   diclofenac Sodium (VOLTAREN) 1 % GEL APPLY 4 G TOPICALLY 4 TIMES DAILY   diltiazem (CARDIZEM) 30 MG tablet Take 1 tablet (30 mg total) by mouth every 6 (six) hours as needed (for rapid heart rate).   furosemide (LASIX) 20 MG tablet Take 1 tablet (20 mg total) by mouth daily.   HYDROcodone-acetaminophen (NORCO) 5-325 MG tablet Take 1 tablet by mouth every 6 (six) hours as needed for moderate pain.   hydrocortisone 2.5 % cream APPLY TO AFFECTED AREA TWICE A DAY   isosorbide mononitrate (IMDUR) 60 MG 24 hr tablet TAKE 1 TABLET (60 MG TOTAL) DAILY BY MOUTH.   levothyroxine (SYNTHROID) 75  MCG tablet Take 1 tablet (75 mcg total) by mouth daily before breakfast.   loratadine (CLARITIN) 10 MG tablet TAKE 1 TABLET BY MOUTH EVERY DAY   metoprolol tartrate (LOPRESSOR) 100 MG tablet Take 1 tablet (100 mg total) by mouth 2 (two) times daily.   nitroGLYCERIN (NITROSTAT) 0.4 MG SL tablet PLACE 1 TABLET (0.4 MG TOTAL) UNDER THE TONGUE EVERY 5 (FIVE) MINUTES AS NEEDED FOR CHEST PAIN.   rosuvastatin (CRESTOR) 10 MG tablet TAKE 1 TABLET BY MOUTH EVERY DAY IN THE EVENING   rosuvastatin (CRESTOR) 20 MG tablet TAKE 1 TABLET BY MOUTH EVERY DAY IN THE EVENING   Zoledronic Acid (RECLAST IV) Inject into the vein. Once yearly   No facility-administered encounter medications on file as of  12/24/2020.    Past Surgical History:  Procedure Laterality Date   ABDOMINAL HYSTERECTOMY     BACK SURGERY  2009   CORONARY ANGIOPLASTY  2003   cutting balloon atherectomy in-stent stenosis of the LAD.   CORONARY ANGIOPLASTY WITH STENT PLACEMENT  2002   remote PCI & stenting LAD,   CORONARY ARTERY BYPASS GRAFT  2004   LIMA to LAD,vein graft to diagonal,vein graft to OM   FOOT SURGERY     LEFT HEART CATH AND CORS/GRAFTS ANGIOGRAPHY N/A 02/23/2017   Procedure: LEFT HEART CATH AND CORS/GRAFTS ANGIOGRAPHY;  Surgeon: Wellington Hampshire, MD;  Location: [place] [facility];  Service: Cardiovascular;  Laterality: N/A;   LEFT HEART CATHETERIZATION WITH CORONARY ANGIOGRAM N/A 12/15/2011   Procedure: LEFT HEART CATHETERIZATION WITH CORONARY ANGIOGRAM;  Surgeon: Sanda Klein, MD;  Location: [place] CATH LAB;  Service: Cardiovascular;  Laterality: N/A;   LOOP RECORDER INSERTION N/A 06/14/2018   Procedure: LOOP RECORDER INSERTION;  Surgeon: Sanda Klein, MD;  Location: [place] CV LAB;  Service: Cardiovascular;  Laterality: N/A;   NECK SURGERY     NM MYOCAR PERF WALL MOTION  12/01/2011   normal study-persistent exercise induced ECG changes raises the concern for ischemia (balanced ischemia).   NOSE SURGERY     PACEMAKER INSERTION     TONSILLECTOMY  1948   TUBAL LIGATION      Family History  Problem Relation Age of Onset   Heart attack Mother    Heart failure Father    Hypertension Father    Heart disease Father    Asthma Maternal Grandmother    Heart disease Sister    Stroke Brother    Voice disorder Daughter        vocal dystonia   Stroke Daughter    Stroke Son    Cancer Sister 18    New complaints: None today  Social history: Lives by herself      Review of Systems  Constitutional:  Negative for diaphoresis.  Eyes:  Negative for pain.  Respiratory:  Negative for shortness of breath.   Cardiovascular:  Negative for chest pain, palpitations and leg swelling.   Gastrointestinal:  Negative for abdominal pain.  Endocrine: Negative for polydipsia.  Skin:  Negative for rash.  Neurological:  Negative for dizziness, weakness and headaches.  Hematological:  Does not bruise/bleed easily.  All other systems reviewed and are negative.     Objective:   Physical Exam Vitals and nursing note reviewed.  Constitutional:      General: She is not in acute distress.    Appearance: Normal appearance. She is well-developed.  HENT:     Head: Normocephalic.     Right Ear: Tympanic membrane normal.     Left  Ear: Tympanic membrane normal.     Nose: Nose normal.     Mouth/Throat:     Mouth: Mucous membranes are moist.  Eyes:     Pupils: Pupils are equal, round, and reactive to light.  Neck:     Vascular: No carotid bruit or JVD.  Cardiovascular:     Rate and Rhythm: Normal rate and regular rhythm.     Heart sounds: Normal heart sounds.  Pulmonary:     Effort: Pulmonary effort is normal. No respiratory distress.     Breath sounds: Normal breath sounds. No wheezing or rales.  Chest:     Chest wall: No tenderness.  Abdominal:     General: Bowel sounds are normal. There is no distension or abdominal bruit.     Palpations: Abdomen is soft. There is no hepatomegaly, splenomegaly, mass or pulsatile mass.     Tenderness: There is no abdominal tenderness.  Musculoskeletal:        General: Normal range of motion.     Cervical back: Normal range of motion and neck supple.  Lymphadenopathy:     Cervical: No cervical adenopathy.  Skin:    General: Skin is warm and dry.  Neurological:     Mental Status: She is alert and oriented to person, place, and time.     Deep Tendon Reflexes: Reflexes are normal and symmetric.  Psychiatric:        Behavior: Behavior normal.        Thought Content: Thought content normal.        Judgment: Judgment normal.    BP (!) 143/63   Pulse (!) 53   Temp 98 F (36.7 C) (Temporal)   Resp 20   Ht 5' 2"  (1.575 m)   Wt 121 lb  (54.9 kg)   SpO2 96%   BMI 22.13 kg/m        Assessment & Plan:  LATIESHA HARADA comes in today with chief complaint of Medical Management of Chronic Issues   Diagnosis and orders addressed:  1. Essential hypertension Low sodium diet - furosemide (LASIX) 20 MG tablet; Take 1 tablet (20 mg total) by mouth daily.  Dispense: 90 tablet; Refill: 1 - CBC with Differential/Platelet - CMP14+EGFR  2. 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 Paroxysmal atrial fibrillation (HCC) Avoid caffeine - diltiazem (CARDIZEM) 30 MG tablet; Take 1 tablet (30 mg total) by mouth every 6 (six) hours as needed (for rapid heart rate).  Dispense: 90 tablet; Refill: 1 - apixaban (ELIQUIS) 2.5 MG TABS tablet; Take 1 tablet (2.5 mg total) by mouth 2 (two) times daily.  Dispense: 180 tablet; Refill: 1 - metoprolol tartrate (LOPRESSOR) 100 MG tablet; Take 1 tablet (100 mg total) by mouth 2 (two) times daily.  Dispense: 180 tablet; Refill: 1  4. Acquired hypothyroidism labs pending - Thyroid Panel With TSH  5. DDD (degenerative disc disease), cervical Moist heat - HYDROcodone-acetaminophen (NORCO) 5-325 MG tablet; Take 1 tablet by mouth every 6 (six) hours as needed for moderate pain.  Dispense: 120 tablet; Refill: 0 - HYDROcodone-acetaminophen (NORCO) 5-325 MG tablet; Take 1 tablet by mouth every 6 (six) hours as needed for moderate pain.  Dispense: 120 tablet; Refill: 0 - HYDROcodone-acetaminophen (NORCO) 5-325 MG tablet; Take 1 tablet by mouth every 6 (six) hours as needed for moderate pain.  Dispense: 120 tablet; Refill: 0 - levothyroxine (SYNTHROID) 75 MCG tablet; Take 1 tablet (  75 mcg total) by mouth daily before breakfast.  Dispense: 90 tablet; Refill: 3  6. Anxiety Stress management  7. Mixed hyperlipidemia Low fat diet - rosuvastatin (CRESTOR) 20 MG tablet; TAKE 1 TABLET BY MOUTH EVERY  DAY IN THE EVENING  Dispense: 90 tablet; Refill: 1 - Lipid panel  8. 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  Dispense: 90 tablet; Refill: 1   Labs pending Health Maintenance reviewed Diet and exercise encouraged  Follow up plan: 3 months   Mary-Margaret Hassell Done, FNP

## 2020-12-24 NOTE — Patient Instructions (Signed)
Textbook of family medicine (9th ed., pp. 1062-1073). [place], [place]: [employer].">  Stress, Adult Stress is a normal reaction to life events. Stress is what you feel when life demands more than you are used to, or more than you think you can handle. Some stress can be useful, such as studying for a test or meeting a deadline at work. Stress that occurs too often or for too long can cause problems. It can affect your emotional health and interfere with relationships and normal daily activities. Too much stress can weaken your body's defense system (immune system) and increase your risk for physical illness. If you already have a medicalproblem, stress can make it worse. What are the causes? All sorts of life events can cause stress. An event that causes stress for one person may not be stressful for another person. Major life events, whether positive or negative, commonly cause stress. Examples include: Losing a job or starting a new job. Losing a loved one. Moving to a new town or home. Getting married or divorced. Having a baby. Getting injured or sick. Less obvious life events can also cause stress, especially if they occur day after day or in combination with each other. Examples include: Working long hours. Driving in traffic. Caring for children. Being in debt. Being in a difficult relationship. What are the signs or symptoms? Stress can cause emotional symptoms, including: Anxiety. This is feeling worried, afraid, on edge, overwhelmed, or out of control. Anger, including irritation or impatience. Depression. This is feeling sad, down, helpless, or guilty. Trouble focusing, remembering, or making decisions. Stress can cause physical symptoms, including: Aches and pains. These may affect your head, neck, back, stomach, or other areas of your body. Tight muscles or a clenched jaw. Low energy. Trouble sleeping. Stress can cause unhealthy behaviors, including: Eating to feel better  (overeating) or skipping meals. Working too much or putting off tasks. Smoking, drinking alcohol, or using drugs to feel better. How is this diagnosed? Stress is diagnosed through an assessment by your health care provider. He or she may diagnose this condition based on: Your symptoms and any stressful life events. Your medical history. Tests to rule out other causes of your symptoms. Depending on your condition, your health care provider may refer you to aspecialist for further evaluation. How is this treated?  Stress management techniques are the recommended treatment for stress. Medicineis not typically recommended for the treatment of stress. Techniques to reduce your reaction to stressful life events include: Stress identification. Monitor yourself for symptoms of stress and identify what causes stress for you. These skills may help you to avoid or prepare for stressful events. Time management. Set your priorities, keep a calendar of events, and learn to say no. Taking these actions can help you avoid making too many commitments. Techniques for coping with stress include: Rethinking the problem. Try to think realistically about stressful events rather than ignoring them or overreacting. Try to find the positives in a stressful situation rather than focusing on the negatives. Exercise. Physical exercise can release both physical and emotional tension. The key is to find a form of exercise that you enjoy and do it regularly. Relaxation techniques. These relax the body and mind. The key is to find one or more that you enjoy and use the techniques regularly. Examples include: Meditation, deep breathing, or progressive relaxation techniques. Yoga or tai chi. Biofeedback, mindfulness techniques, or journaling. Listening to music, being out in nature, or participating in other hobbies. Practicing a healthy lifestyle.   Eat a balanced diet, drink plenty of water, limit or avoid caffeine, and get  plenty of sleep. Having a strong support network. Spend time with family, friends, or other people you enjoy being around. Express your feelings and talk things over with someone you trust. Counseling or talk therapy with a mental health professional may be helpful if you are havingtrouble managing stress on your own. Follow these instructions at home: Lifestyle  Avoid drugs. Do not use any products that contain nicotine or tobacco, such as cigarettes, e-cigarettes, and chewing tobacco. If you need help quitting, ask your health care provider. Limit alcohol intake to no more than 1 drink a day for nonpregnant women and 2 drinks a day for men. One drink equals 12 oz of beer, 5 oz of wine, or 1 oz of hard liquor Do not use alcohol or drugs to relax. Eat a balanced diet that includes fresh fruits and vegetables, whole grains, lean meats, fish, eggs, and beans, and low-fat dairy. Avoid processed foods and foods high in added fat, sugar, and salt. Exercise at least 30 minutes on 5 or more days each week. Get 7-8 hours of sleep each night.  General instructions  Practice stress management techniques as discussed with your health care provider. Drink enough fluid to keep your urine clear or pale yellow. Take over-the-counter and prescription medicines only as told by your health care provider. Keep all follow-up visits as told by your health care provider. This is important.  Contact a health care provider if: Your symptoms get worse. You have new symptoms. You feel overwhelmed by your problems and can no longer manage them on your own. Get help right away if: You have thoughts of hurting yourself or others. If you ever feel like you may hurt yourself or others, or have thoughts about taking your own life, get help right away. You can go to your nearest emergency department or call: Your local emergency services (911 in the [place] A suicide crisis helpline, such as the [facility] at 4253100524. This is open 24 hours a day. Summary Stress is a normal reaction to life events. It can cause problems if it happens too often or for too long. Practicing stress management techniques is the best way to treat stress. Counseling or talk therapy with a mental health professional may be helpful if you are having trouble managing stress on your own. This information is not intended to replace advice given to you by your health care provider. Make sure you discuss any questions you have with your healthcare provider. Document Revised: 01/18/2020 Document Reviewed: 01/18/2020 Elsevier Patient Education  2022 [employer].

## 2020-12-25 ENCOUNTER — Ambulatory Visit: Payer: Medicare HMO | Admitting: Nurse Practitioner

## 2020-12-25 ENCOUNTER — Encounter: Payer: Self-pay | Admitting: Nurse Practitioner

## 2020-12-25 DIAGNOSIS — S161XXA Strain of muscle, fascia and tendon at neck level, initial encounter: Secondary | ICD-10-CM | POA: Diagnosis not present

## 2020-12-25 MED ORDER — CYCLOBENZAPRINE HCL 5 MG PO TABS: 5.0000 mg | ORAL_TABLET | Freq: Three times a day (TID) | ORAL | 0 refills | Status: DC | PRN

## 2020-12-25 MED ORDER — PREDNISONE 20 MG PO TABS: 40.0000 mg | ORAL_TABLET | Freq: Every day | ORAL | 0 refills | Status: AC

## 2020-12-25 NOTE — Progress Notes (Signed)
Virtual Visit  Note Due to COVID-19 pandemic this visit was conducted virtually. This visit type was conducted due to national recommendations for restrictions regarding the COVID-19 Pandemic (e.g. social distancing, sheltering in place) in an effort to limit this patient's exposure and mitigate transmission in our community. All issues noted in this document were discussed and addressed.  A physical exam was not performed with this format.  I connected with Evelyn [name] on 12/25/20 at 3:58 by telephone and verified that I am speaking with the correct person using two identifiers. Evelyn [name] is currently located at home and no one is currently with her during visit. The provider, [name], FNP is located in their office at time of visit.  I discussed the limitations, risks, security and privacy concerns of performing an evaluation and management service by telephone and the availability of in person appointments. I also discussed with the patient that there may be a patient responsible charge related to this service. The patient expressed understanding and agreed to proceed.   History and Present Illness:   Chief Complaint: neck pain  HPI Patient woke up tuesday morning with neck pain. Has gotten worse the last 2 days. Hurts to move her neck at all. Rates pain 10/10 today when she moves. Holding her neck still with relieve some pain.    Review of Systems  Constitutional:  Negative for chills and fever.  Musculoskeletal:  Positive for neck pain.  Neurological:  Negative for dizziness and headaches.  All other systems reviewed and are negative.   Observations/Objective: Alert and oriented- answers all questions appropriately No distress Patient states pain incervical spine on flexion, and rotation  Assessment and Plan: Evelyn [name] in today with chief complaint of Neck Pain   1. Strain of neck muscle, initial encounter Moist heat Rest Message Sedation  precautions with flexeril Meds ordered this encounter  Medications  . cyclobenzaprine (FLEXERIL) 5 MG tablet    Sig: Take 1 tablet (5 mg total) by mouth 3 (three) times daily as needed for muscle spasms.    Dispense:  30 tablet    Refill:  0    Order Specific Question:   Supervising Provider    Answer:   Arville Care A [id number]  . predniSONE (DELTASONE) 20 MG tablet    Sig: Take 2 tablets (40 mg total) by mouth daily with breakfast for 5 days. 2 po daily for 5 days    Dispense:  10 tablet    Refill:  0    Order Specific Question:   Supervising Provider    Answer:   Arville Care A [[id number]]      Follow Up Instructions: prn    I discussed the assessment and treatment plan with the patient. The patient was provided an opportunity to ask questions and all were answered. The patient agreed with the plan and demonstrated an understanding of the instructions.   The patient was advised to call back or seek an in-person evaluation if the symptoms worsen or if the condition fails to improve as anticipated.  The above assessment and management plan was discussed with the patient. The patient verbalized understanding of and has agreed to the management plan. Patient is aware to call the clinic if symptoms persist or worsen. Patient is aware when to return to the clinic for a follow-up visit. Patient educated on when it is appropriate to go to the emergency department.   Time call ended:  4:10  I provided 12 minutes  of  non face-to-face time during this encounter.    Mary-Margaret Hassell Done, FNP

## 2021-01-01 ENCOUNTER — Other Ambulatory Visit: Payer: Self-pay | Admitting: Nurse Practitioner

## 2021-01-01 ENCOUNTER — Telehealth: Payer: Self-pay | Admitting: Nurse Practitioner

## 2021-01-01 MED ORDER — PREDNISONE 10 MG (21) PO TBPK: ORAL_TABLET | ORAL | 0 refills | Status: DC

## 2021-01-01 NOTE — Telephone Encounter (Signed)
Patient notified and verbalized understanding. 

## 2021-01-01 NOTE — Telephone Encounter (Signed)
Steroid dose pack sent o pharmacy

## 2021-01-01 NOTE — Telephone Encounter (Signed)
  Incoming Patient Call  01/01/2021  What symptoms do you have? Neck pain  How long have you been sick? Since she was last seen, it has improved slightly thinks a larger and longer dosage will help  Have you been seen for this problem?yes  If your provider decides to give you a prescription, which pharmacy would you like for it to be sent to? [facility]    Patient informed that this information will be sent to the clinical staff for review and that they should receive a follow up call.

## 2021-01-02 NOTE — Progress Notes (Signed)
Carelink Summary Report / Loop Recorder 

## 2021-01-12 ENCOUNTER — Ambulatory Visit (INDEPENDENT_AMBULATORY_CARE_PROVIDER_SITE_OTHER): Payer: Medicare HMO

## 2021-01-12 DIAGNOSIS — I480 Paroxysmal atrial fibrillation: Secondary | ICD-10-CM | POA: Diagnosis not present

## 2021-01-13 LAB — CUP PACEART REMOTE DEVICE CHECK
Date Time Interrogation Session: 20220822000807
Implantable Pulse Generator Implant Date: 20200129

## 2021-01-23 NOTE — Progress Notes (Signed)
Carelink Summary Report / Loop Recorder 

## 2021-02-04 ENCOUNTER — Encounter: Payer: Self-pay | Admitting: Cardiovascular Disease

## 2021-02-04 ENCOUNTER — Ambulatory Visit: Payer: Medicare HMO | Admitting: Cardiovascular Disease

## 2021-02-04 ENCOUNTER — Other Ambulatory Visit: Payer: Self-pay

## 2021-02-04 VITALS — BP 144/70 | HR 56 | Ht 62.0 in | Wt 120.0 lb

## 2021-02-04 DIAGNOSIS — I351 Nonrheumatic aortic (valve) insufficiency: Secondary | ICD-10-CM

## 2021-02-04 DIAGNOSIS — I2510 Atherosclerotic heart disease of native coronary artery without angina pectoris: Secondary | ICD-10-CM | POA: Diagnosis not present

## 2021-02-04 DIAGNOSIS — Z7901 Long term (current) use of anticoagulants: Secondary | ICD-10-CM

## 2021-02-04 DIAGNOSIS — E039 Hypothyroidism, unspecified: Secondary | ICD-10-CM

## 2021-02-04 DIAGNOSIS — I712 Thoracic aortic aneurysm, without rupture: Secondary | ICD-10-CM

## 2021-02-04 DIAGNOSIS — Z8673 Personal history of transient ischemic attack (TIA), and cerebral infarction without residual deficits: Secondary | ICD-10-CM | POA: Diagnosis not present

## 2021-02-04 DIAGNOSIS — I480 Paroxysmal atrial fibrillation: Secondary | ICD-10-CM

## 2021-02-04 DIAGNOSIS — E7800 Pure hypercholesterolemia, unspecified: Secondary | ICD-10-CM

## 2021-02-04 DIAGNOSIS — I7121 Aneurysm of the ascending aorta, without rupture: Secondary | ICD-10-CM

## 2021-02-04 DIAGNOSIS — I10 Essential (primary) hypertension: Secondary | ICD-10-CM

## 2021-02-04 NOTE — Progress Notes (Signed)
Cardiology Office Note    Date:  02/04/2021   ID:  Evelyn [name], DOB 01-08-1941, MRN [id number]  PCP:  Bennie Pierini, FNP  Cardiologist:   Thurmon Fair, MD   No chief complaint on file.   History of Present Illness:  Evelyn [name] is a 80 y.o. female with long-standing history of coronary artery disease with previous stents to LAD followed by bypass surgery (LIMA to LAD, SVG to diagonal, SVG to OM in 2004), hyperlipidemia, recent TIA which led to implantation of a loop recorder that promptly demonstrated the presence of asymptomatic paroxysmal atrial fibrillation with rapid ventricular response, mild ascending aortic aneurysm, aortic insufficiency, hypertension, hyperlipidemia.  He has not had any further neurological events to suggest TIA.  She has rare episodes of chest discomfort that resolved promptly and spontaneously.  They generally occur at rest without an obvious precipitant.  She has not taken nitroglycerin.  She denies orthopnea, PND or exertional dyspnea.  She does not have lower extremity edema or claudication.  Carotid ultrasound performed in February 2022 following an episode suggestive of TIA showed only minor bilateral plaque.  There was evidence of a multinodular goiter with a dominant nodule and a follow-up dedicated ultrasound of the thyroid in June showed that the larger nodule had decreased in size.  Her most recent TSH in June was 1.50.  She follows with Dr. Fransico Him.  Over the last several months her loop recorder has shown the absence of any meaningful episodes of atrial fibrillation.  The episodes that have been recorded are always just a few minutes long, although she has very fast ventricular rates.  The longest episode occurred in June and was 38 minutes long.  A 3-minute episode that occurred in July had a ventricular rate of 194 bpm.  She had a previous episode suggesting TIA in May 2018.  She had a CT angiogram that did not show any evidence of  meaningful extracranial carotid atherosclerosis or stenoses.  She also had an echocardiogram with a bubble study in May 2018 without any evidence of intracardiac shunt.  She had normal carotid ultrasound in 2014.  Angiography in October 2018 showed virtually no change in coronary anatomy since 2013.  She has a chronically occluded LAD artery with multiple occluded stents, but has good downstream blood flow via LIMA-LAD and SVG-diagonal bypasses.  There is probably a significant area of myocardial septum that is ischemic, upstream of the blocked mid LAD.  There was no mention of a SVG bypass to the oblique marginal branch, but the left circumflex coronary artery was widely patent with no more than 20% stenosis (Chest CTA showed that the SVG-OM is now occluded).  Left ventricular regional wall motion and overall systolic function remain normal and the LVEDP was only 14 mmHg. Although the chest CT angiogram suggested there was a 70% stenosis in the left main coronary artery, that potentially could lead to ischemia in the circumflex territory, this was not found to be the case by invasive angiography.  Had an echocardiogram in May 2018, showing normal LVEF 55-60%, with inferior and inferoseptal mild hypokinesis, mildly dilated ascending aorta, mild aortic insufficiency. In September 2017 her treadmill nuclear stress test showed normal myocardial perfusion and EF 62%. In October 2017 she had the last evaluation of her aorta, showing mild aneurysmal dilation of the ascending aorta at 3.7 cm and of the aortic arch at 3.3 cm. (She is a very small frame lady, 5 foot 2 inches tall, 112 pounds).  CT angiography of the aorta October 2018 showed unchanged aortic root dimension at 3.3 cm.  The SVG to OM was described as occluded.  She has long-standing history of coronary disease, undergoing her first cardiac catheterization in 1999. She received stents to the LAD artery in 2002 and had to have cutting balloon atherectomy  for in-stent restenosis in 2003 followed by a new LAD stent in 2004 for disease progression. Ultimately after again developing restenosis she underwent coronary bypass surgery (LIMA to LAD, SVG to diagonal, SVG to OM in 2004). She had cardiac catheterization in July 2013 and October 2018, without evidence of significant progression. This demonstrated interval total occlusion of the proximal LAD artery although the distant bypasses are all widely patent. On her stress test in 2013 and 2017  the perfusion images were "normal". It is felt that she may have occasional angina pectoris related to ischemia in the proximal septum since there is no retrograde flow from the bypasses to the proximal LAD. Her last echo in May 2018 showed normal EF 55-60%, but reported inferior and inferoseptal mild hypokinesis. 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  CT angiogram of the head and neck performed in May 2018 for TIA showed mild extracranial atherosclerosis. She has treated systemic hypertension and hyperlipidemia and hypothyroidism.  She had previous cervical spine surgery with bone grafting and discectomy in 2009.    Past Medical History:  Diagnosis Date   Aneurysm (HCC)    Aortic insufficiency    Atrial fibrillation (HCC)    CAD (coronary artery disease)    CABG LIMA to LAD,vein graft to diagonal,vein graft to OM   Dyslipidemia    GERD (gastroesophageal reflux disease)    Hypothyroid     Past Surgical History:  Procedure Laterality Date   ABDOMINAL HYSTERECTOMY     BACK SURGERY  2009   CORONARY ANGIOPLASTY  2003   cutting balloon atherectomy in-stent stenosis of the LAD.   CORONARY ANGIOPLASTY WITH STENT PLACEMENT  2002   remote PCI & stenting LAD,   CORONARY ARTERY BYPASS GRAFT  2004   LIMA to LAD,vein graft to diagonal,vein graft to OM   FOOT SURGERY     LEFT HEART CATH AND CORS/GRAFTS ANGIOGRAPHY N/A 02/23/2017   Procedure: LEFT HEART CATH  AND CORS/GRAFTS ANGIOGRAPHY;  Surgeon: Iran Ouch, MD;  Location: [facility];  Service: Cardiovascular;  Laterality: N/A;   LEFT HEART CATHETERIZATION WITH CORONARY ANGIOGRAM N/A 12/15/2011   Procedure: LEFT HEART CATHETERIZATION WITH CORONARY ANGIOGRAM;  Surgeon: Thurmon Fair, MD;  Location: [facility]ATH [facility];  Service: Cardiovascular;  Laterality: N/A;   LOOP RECORDER INSERTION N/A 06/14/2018   Procedure: LOOP RECORDER INSERTION;  Surgeon: Thurmon Fair, MD;  Location: [facility];  Service: Cardiovascular;  Laterality: N/A;   NECK SURGERY     NM MYOCAR PERF WALL MOTION  12/01/2011   normal study-persistent exercise induced ECG changes raises the concern for ischemia (balanced ischemia).   NOSE SURGERY     PACEMAKER INSERTION     TONSILLECTOMY  1948   TUBAL LIGATION      Current Medications: Outpatient Medications Prior to Visit  Medication Sig Dispense Refill   apixaban (ELIQUIS) 2.5 MG TABS tablet Take 1 tablet (2.5 mg total) by mouth 2 (two) times daily. 180 tablet 1   cyclobenzaprine (FLEXERIL) 5 MG tablet Take 1 tablet (5 mg total) by mouth 3 (three) times daily as needed for muscle spasms.  30 tablet 0   diclofenac Sodium (VOLTAREN) 1 % GEL APPLY 4 G TOPICALLY 4 TIMES DAILY 300 g 0   diltiazem (CARDIZEM) 30 MG tablet Take 1 tablet (30 mg total) by mouth every 6 (six) hours as needed (for rapid heart rate). 90 tablet 1   furosemide (LASIX) 20 MG tablet Take 1 tablet (20 mg total) by mouth daily. 90 tablet 1   [START ON 02/22/2021] HYDROcodone-acetaminophen (NORCO) 5-325 MG tablet Take 1 tablet by mouth every 6 (six) hours as needed for moderate pain. 120 tablet 0   HYDROcodone-acetaminophen (NORCO) 5-325 MG tablet Take 1 tablet by mouth every 6 (six) hours as needed for moderate pain. 120 tablet 0   HYDROcodone-acetaminophen (NORCO) 5-325 MG tablet Take 1 tablet by mouth every 6 (six) hours as needed for moderate pain. 120 tablet 0   hydrocortisone 2.5 % cream APPLY  TO AFFECTED AREA TWICE A DAY 28.35 g 6   isosorbide mononitrate (IMDUR) 60 MG 24 hr tablet TAKE 1 TABLET (60 MG TOTAL) DAILY BY MOUTH. 90 tablet 1   levothyroxine (SYNTHROID) 75 MCG tablet Take 1 tablet (75 mcg total) by mouth daily before breakfast. 90 tablet 3   loratadine (CLARITIN) 10 MG tablet TAKE 1 TABLET BY MOUTH EVERY DAY 90 tablet 3   metoprolol tartrate (LOPRESSOR) 100 MG tablet Take 1 tablet (100 mg total) by mouth 2 (two) times daily. 180 tablet 1   nitroGLYCERIN (NITROSTAT) 0.4 MG SL tablet PLACE 1 TABLET (0.4 MG TOTAL) UNDER THE TONGUE EVERY 5 (FIVE) MINUTES AS NEEDED FOR CHEST PAIN. 25 tablet 2   predniSONE (STERAPRED UNI-PAK 21 TAB) 10 MG (21) TBPK tablet As directed x 6 days 21 tablet 0   rosuvastatin (CRESTOR) 20 MG tablet TAKE 1 TABLET BY MOUTH EVERY DAY IN THE EVENING 90 tablet 1   Zoledronic Acid (RECLAST IV) Inject into the vein. Once yearly     No facility-administered medications prior to visit.     Allergies:   Sulfa antibiotics   Social History   Socioeconomic History   Marital status: Widowed    Spouse name: Not on file   Number of children: 2   Years of education: Associates   Highest education level: Associate degree: academic program  Occupational History   Occupation: Retired  Tobacco Use   Smoking status: Never   Smokeless tobacco: Never  Building services engineer Use: Never used  Substance and Sexual Activity   Alcohol use: No   Drug use: No   Sexual activity: Not Currently  Other Topics Concern   Not on file  Social History Narrative   Mother of 2 with 4 grandchildren   Right-handed   Caffeine: 8-10 cups coffee   Social Determinants of Health   Financial Resource Strain: Not on file  Food Insecurity: Not on file  Transportation Needs: Not on file  Physical Activity: Not on file  Stress: Not on file  Social Connections: Not on file     Family History:  The patient's family history includes Asthma in her maternal grandmother; Cancer (age  of onset: 51) in her sister; Heart attack in her mother; Heart disease in her father and sister; Heart failure in her father; Hypertension in her father; Stroke in her brother, daughter, and son; Voice disorder in her daughter.   ROS:   Please see the history of present illness.    ROS All other systems are reviewed and are negative.   PHYSICAL EXAM:   VS:  BP [name])  144/70   Pulse (!) 56   Ht 5\' 2"  (1.575 m)   Wt 120 lb (54.4 kg)   SpO2 96%   BMI 21.95 kg/m      General: Alert, oriented x3, no distress, lean and appears younger than stated age Head: no evidence of trauma, PERRL, EOMI, no exophtalmos or lid lag, no myxedema, no xanthelasma; normal ears, nose and oropharynx Neck: normal jugular venous pulsations and no hepatojugular reflux; brisk carotid pulses without delay and no carotid bruits Chest: clear to auscultation, no signs of consolidation by percussion or palpation, normal fremitus, symmetrical and full respiratory excursions Cardiovascular: normal position and quality of the apical impulse, regular rhythm, normal first and second heart sounds, no murmurs, rubs or gallops Abdomen: no tenderness or distention, no masses by palpation, no abnormal pulsatility or arterial bruits, normal bowel sounds, no hepatosplenomegaly Extremities: no clubbing, cyanosis or edema; 2+ radial, ulnar and brachial pulses bilaterally; 2+ right femoral, posterior tibial and dorsalis pedis pulses; 2+ left femoral, posterior tibial and dorsalis pedis pulses; no subclavian or femoral bruits Neurological: grossly nonfocal Psych: Normal mood and affect    Wt Readings from Last 3 Encounters:  02/04/21 120 lb (54.4 kg)  12/24/20 121 lb (54.9 kg)  11/10/20 120 lb 9.6 oz (54.7 kg)      Studies/Labs Reviewed:   EKG:  EKG is ordered today and personally reviewed.  It shows similar findings with previous tracings with normal sinus rhythm and ST depression with subtle T wave inversion in the mid precordial  leads .  06/26/2020: ALT 8; BUN 8; Creatinine, Ser 0.61; Hemoglobin 12.1; Platelets 222; Potassium 3.9; Sodium 141 11/07/2020: TSH 1.500   Lipid Panel    Component Value Date/Time   CHOL 101 06/26/2020 1116   TRIG 53 06/26/2020 1116   HDL 53 06/26/2020 1116   CHOLHDL 1.9 06/26/2020 1116   CHOLHDL 2.0 07/11/2014 0913   VLDL 12 07/11/2014 0913   LDLCALC 35 06/26/2020 1116     ASSESSMENT:    1. Paroxysmal atrial fibrillation (HCC)   2. History of transient ischemic attack (TIA)   3. Long term (current) use of anticoagulants   4. Coronary artery disease involving native coronary artery of native heart without angina pectoris   5. Essential hypertension   6. Hypercholesterolemia   7. Nonrheumatic aortic (valve) insufficiency   8. Ascending aortic aneurysm (HCC)   9. Acquired hypothyroidism      PLAN:  In order of problems listed above:  AFib/AFlutter: The prevalence of arrhythmia remains very low (well under 1%) with episodes that are both infrequent and brief (never over 40 minutes).  However, she continues to have significant RVR which causes symptoms.  She has not required diltiazem "as needed" in the last several months.  She has a history of TIA and high embolic risk (CHA2DS2-VASc 6).  Compliant with anticoagulation. TIA: She had an episode suggestive of dysarthria/aphasia lasting for about 20 minutes about 6 months ago, this has not recurred.  No evidence of significant carotid disease.  Very low burden of atrial fibrillation, on anticoagulants.   Anticoagulation: Dose adjusted for age and small body size.  No bleeding complications. CAD s/p CABG: With the current isosorbide regimen she continues to have occasional episodes of chest pain, but these are brief and do not usually require nitroglycerin administration.  Episodes of chest discomfort usually occur at rest, for example when she is driving.  Could be vasospasm. May also have ischemia in the proximal septum downstream of  the occluded LAD artery but upstream of the LIMA supplied territory.  Also has an occluded SVG to the oblique marginal artery, although the native circumflex artery is patent.  Not on aspirin due to anticoagulation.   HTN: Usually well controlled.  Borderline elevation of systolic blood pressure today, unusual for her.  She will keep [place] posted if this occurs frequently at home. HLP: Excellent response to rosuvastatin, which she seems to be tolerating now.  LDL is within target range (she did not tolerate potent statins in the past and was not at target with pravastatin plus ezetimibe). 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  Asymptomatic and no audible murmur on exam. Asc Ao Aneurysm: Very small and stable in size. Will not be a preventive surgical candidate, so we are not performing routine monitoring. Acquired hypothyroidism/iatrogenic hyperthyroidism: Multinodular goiter.  TSH is normal.  Sees Dr. Fransico Him in [place].  Time spent with patient: 40 minutes.  Medication Adjustments/Labs and Tests Ordered: Current medicines are reviewed at length with the patient today.  Concerns regarding medicines are outlined above.  Medication changes, Labs and Tests ordered today are listed in the Patient Instructions below. Patient Instructions  Medication Instructions:  No changes *If you need a refill on your cardiac medications before your next appointment, please call your pharmacy*   Lab Work: None ordered If you have labs (blood work) drawn today and your tests are completely normal, you will receive your results only by: MyChart Message (if you have MyChart) OR A paper copy in the mail If you have any lab test that is abnormal or we need to change your treatment, we will call you to review the results.   Testing/Procedures: None ordered   Follow-Up: At [facility], you and your health needs are our priority.  As part of our continuing mission to provide you with exceptional heart care, we have  created designated Provider Care Teams.  These Care Teams include your primary Cardiologist (physician) and Advanced Practice Providers (APPs -  Physician Assistants and Nurse Practitioners) who all work together to provide you with the care you need, when you need it.  We recommend signing up for the patient portal called "MyChart".  Sign up information is provided on this After Visit Summary.  MyChart is used to connect with patients for Virtual Visits (Telemedicine).  Patients are able to view lab/test results, encounter notes, upcoming appointments, etc.  Non-urgent messages can be sent to your provider as well.   To learn more about what you can do with MyChart, go to [email or web address].    Your next appointment:   12 month(s)  The format for your next appointment:   In Person  Provider:   You may see Thurmon Fair, MD or one of the following Advanced Practice Providers on your designated Care Team:   Azalee Course, PA-C Micah Flesher, [place] or  Judy Pimple, PA-C    Signed, Thurmon Fair, MD  02/04/2021 11:02 AM    [facility] [address] [place], [place], [place]  [address] Phone: [phone]; Fax: [phone]

## 2021-02-04 NOTE — Patient Instructions (Signed)

## 2021-02-16 ENCOUNTER — Ambulatory Visit (INDEPENDENT_AMBULATORY_CARE_PROVIDER_SITE_OTHER): Payer: Medicare HMO

## 2021-02-16 DIAGNOSIS — I480 Paroxysmal atrial fibrillation: Secondary | ICD-10-CM | POA: Diagnosis not present

## 2021-02-16 LAB — CUP PACEART REMOTE DEVICE CHECK
Date Time Interrogation Session: 20220924000724
Implantable Pulse Generator Implant Date: 20200129

## 2021-02-24 NOTE — Progress Notes (Signed)
Carelink Summary Report / Loop Recorder 

## 2021-02-26 ENCOUNTER — Ambulatory Visit (INDEPENDENT_AMBULATORY_CARE_PROVIDER_SITE_OTHER): Payer: Medicare HMO

## 2021-02-26 VITALS — Ht 62.0 in | Wt 120.0 lb

## 2021-02-26 DIAGNOSIS — Z0000 Encounter for general adult medical examination without abnormal findings: Secondary | ICD-10-CM | POA: Diagnosis not present

## 2021-02-26 NOTE — Patient Instructions (Signed)
Evelyn [name] , Thank you for taking time to come for your Medicare Wellness Visit. I appreciate your ongoing commitment to your health goals. Please review the following plan we discussed and let me know if I can assist you in the future.   Screening recommendations/referrals: Colonoscopy: No longer required Mammogram: No longer required Bone Density: Done 01/07/2016 - Repeat every 2 years *get at next visit Recommended yearly ophthalmology/optometry visit for glaucoma screening and checkup Recommended yearly dental visit for hygiene and checkup  Vaccinations: Influenza vaccine: Done 02/29/2020 - Repeat annually *due Pneumococcal vaccine: Done 01/11/2018 - due for Pneumovax-23 Tdap vaccine: Due. Every 10 years Shingles vaccine: Due. Shingrix discussed   Covid-19: Done 01/02/2020 & 01/30/2020  Advanced directives: Please bring a copy of your health care power of attorney and living will to the office to be added to your chart at your convenience.   Conditions/risks identified: Aim for 30 minutes of exercise or brisk walking each day, drink 6-8 glasses of water and eat lots of fruits and vegetables.   Next appointment: Follow up in one year for your annual wellness visit    Preventive Care 65 Years and Older, Female Preventive care refers to lifestyle choices and visits with your health care provider that can promote health and wellness. What does preventive care include? A yearly physical exam. This is also called an annual well check. Dental exams once or twice a year. Routine eye exams. Ask your health care provider how often you should have your eyes checked. Personal lifestyle choices, including: Daily care of your teeth and gums. Regular physical activity. Eating a healthy diet. Avoiding tobacco and drug use. Limiting alcohol use. Practicing safe sex. Taking low-dose aspirin every day. Taking vitamin and mineral supplements as recommended by your health care provider. What  happens during an annual well check? The services and screenings done by your health care provider during your annual well check will depend on your age, overall health, lifestyle risk factors, and family history of disease. Counseling  Your health care provider may ask you questions about your: Alcohol use. Tobacco use. Drug use. Emotional well-being. Home and relationship well-being. Sexual activity. Eating habits. History of falls. Memory and ability to understand (cognition). Work and work Astronomer. Reproductive health. Screening  You may have the following tests or measurements: Height, weight, and BMI. Blood pressure. Lipid and cholesterol levels. These may be checked every 5 years, or more frequently if you are over 36 years old. Skin check. Lung cancer screening. You may have this screening every year starting at age 82 if you have a 30-pack-year history of smoking and currently smoke or have quit within the past 15 years. Fecal occult blood test (FOBT) of the stool. You may have this test every year starting at age 82. Flexible sigmoidoscopy or colonoscopy. You may have a sigmoidoscopy every 5 years or a colonoscopy every 10 years starting at age 82. Hepatitis C blood test. Hepatitis B blood test. Sexually transmitted disease (STD) testing. Diabetes screening. This is done by checking your blood sugar (glucose) after you have not eaten for a while (fasting). You may have this done every 1-3 years. Bone density scan. This is done to screen for osteoporosis. You may have this done starting at age 82. Mammogram. This may be done every 1-2 years. Talk to your health care provider about how often you should have regular mammograms. Talk with your health care provider about your test results, treatment options, and if necessary, the need for  more tests. Vaccines  Your health care provider Mchugh recommend certain vaccines, such as: Influenza vaccine. This is recommended every  year. Tetanus, diphtheria, and acellular pertussis (Tdap, Td) vaccine. You Chanda need a Td booster every 10 years. Zoster vaccine. You Legan need this after age 82. Pneumococcal 13-valent conjugate (PCV13) vaccine. One dose is recommended after age 41. Pneumococcal polysaccharide (PPSV23) vaccine. One dose is recommended after age 82. Talk to your health care provider about which screenings and vaccines you need and how often you need them. This information is not intended to replace advice given to you by your health care provider. Make sure you discuss any questions you have with your health care provider. Document Released: 05/30/2015 Document Revised: 01/21/2016 Document Reviewed: 03/04/2015 Elsevier Interactive Patient Education  2017 [place] Prevention in the Home Falls can cause injuries. They can happen to people of all ages. There are many things you can do to make your home safe and to help prevent falls. What can I do on the outside of my home? Regularly fix the edges of walkways and driveways and fix any cracks. Remove anything that might make you trip as you walk through a door, such as a raised step or threshold. Trim any bushes or trees on the path to your home. Use bright outdoor lighting. Clear any walking paths of anything that might make someone trip, such as rocks or tools. Regularly check to see if handrails are loose or broken. Make sure that both sides of any steps have handrails. Any raised decks and porches should have guardrails on the edges. Have any leaves, snow, or ice cleared regularly. Use sand or salt on walking paths during winter. Clean up any spills in your garage right away. This includes oil or grease spills. What can I do in the bathroom? Use night lights. Install grab bars by the toilet and in the tub and shower. Do not use towel bars as grab bars. Use non-skid mats or decals in the tub or shower. If you need to sit down in the shower, use a  plastic, non-slip stool. Keep the floor dry. Clean up any water that spills on the floor as soon as it happens. Remove soap buildup in the tub or shower regularly. Attach bath mats securely with double-sided non-slip rug tape. Do not have throw rugs and other things on the floor that can make you trip. What can I do in the bedroom? Use night lights. Make sure that you have a light by your bed that is easy to reach. Do not use any sheets or blankets that are too big for your bed. They should not hang down onto the floor. Have a firm chair that has side arms. You can use this for support while you get dressed. Do not have throw rugs and other things on the floor that can make you trip. What can I do in the kitchen? Clean up any spills right away. Avoid walking on wet floors. Keep items that you use a lot in easy-to-reach places. If you need to reach something above you, use a strong step stool that has a grab bar. Keep electrical cords out of the way. Do not use floor polish or wax that makes floors slippery. If you must use wax, use non-skid floor wax. Do not have throw rugs and other things on the floor that can make you trip. What can I do with my stairs? Do not leave any items on the stairs. Make  sure that there are handrails on both sides of the stairs and use them. Fix handrails that are broken or loose. Make sure that handrails are as long as the stairways. Check any carpeting to make sure that it is firmly attached to the stairs. Fix any carpet that is loose or worn. Avoid having throw rugs at the top or bottom of the stairs. If you do have throw rugs, attach them to the floor with carpet tape. Make sure that you have a light switch at the top of the stairs and the bottom of the stairs. If you do not have them, ask someone to add them for you. What else can I do to help prevent falls? Wear shoes that: Do not have high heels. Have rubber bottoms. Are comfortable and fit you  well. Are closed at the toe. Do not wear sandals. If you use a stepladder: Make sure that it is fully opened. Do not climb a closed stepladder. Make sure that both sides of the stepladder are locked into place. Ask someone to hold it for you, if possible. Clearly mark and make sure that you can see: Any grab bars or handrails. First and last steps. Where the edge of each step is. Use tools that help you move around (mobility aids) if they are needed. These include: Canes. Walkers. Scooters. Crutches. Turn on the lights when you go into a dark area. Replace any light bulbs as soon as they burn out. Set up your furniture so you have a clear path. Avoid moving your furniture around. If any of your floors are uneven, fix them. If there are any pets around you, be aware of where they are. Review your medicines with your doctor. Some medicines can make you feel dizzy. This can increase your chance of falling. Ask your doctor what other things that you can do to help prevent falls. This information is not intended to replace advice given to you by your health care provider. Make sure you discuss any questions you have with your health care provider. Document Released: 02/27/2009 Document Revised: 10/09/2015 Document Reviewed: 06/07/2014 Elsevier Interactive Patient Education  2017 [employer].

## 2021-02-26 NOTE — Progress Notes (Signed)
Subjective:   Evelyn [name] is a 80 y.o. female who presents for Medicare Annual (Subsequent) preventive examination.  Virtual Visit via Telephone Note  I connected with  [name] on 02/26/21 at 10:30 AM EDT by telephone and verified that I am speaking with the correct person using two identifiers.  Location: Patient: Home Provider: [facility] Persons participating in the virtual visit: patient/Nurse Health Advisor   I discussed the limitations, risks, security and privacy concerns of performing an evaluation and management service by telephone and the availability of in person appointments. The patient expressed understanding and agreed to proceed.  Interactive audio and video telecommunications were attempted between this nurse and patient, however failed, due to patient having technical difficulties OR patient did not have access to video capability.  We continued and completed visit with audio only.  Some vital signs may be absent or patient reported.   Keyira Mondesir E Princes Finger, LPN   Review of Systems     Cardiac Risk Factors include: advanced age (>69men, >85 women);family history of premature cardiovascular disease;hypertension;Other (see comment), Risk factor comments: CAD, hx of TIA, A.Fib     Objective:    Today's Vitals   02/26/21 1035  Weight: 120 lb (54.4 kg)  Height: 5\' 2"  (1.575 m)   Body mass index is 21.95 kg/m.  Advanced Directives 02/26/2021 02/26/2020 01/18/2019 01/11/2018 02/23/2017 09/01/2016 12/15/2011  Does Patient Have a Medical Advance Directive? Yes Yes Yes Yes No Yes Patient does not have advance directive  Type of Advance Directive Healthcare Power of [place];Living will Living will Living will Living will - Living will -  Does patient want to make changes to medical advance directive? - No - Patient declined No - Patient declined Yes (MAU/Ambulatory/Procedural Areas - Information given) - No - Patient declined -  Copy of Healthcare Power of Attorney in  Chart? No - copy requested - - No - copy requested - - -  Would patient like information on creating a medical advance directive? - - - - No - Patient declined - -    Current Medications (verified) Outpatient Encounter Medications as of 02/26/2021  Medication Sig   amoxicillin (AMOXIL) 500 MG capsule Take 1,000 mg by mouth 2 (two) times daily.   apixaban (ELIQUIS) 2.5 MG TABS tablet Take 1 tablet (2.5 mg total) by mouth 2 (two) times daily.   cyclobenzaprine (FLEXERIL) 5 MG tablet Take 1 tablet (5 mg total) by mouth 3 (three) times daily as needed for muscle spasms.   diclofenac Sodium (VOLTAREN) 1 % GEL APPLY 4 G TOPICALLY 4 TIMES DAILY   diltiazem (CARDIZEM) 30 MG tablet Take 1 tablet (30 mg total) by mouth every 6 (six) hours as needed (for rapid heart rate).   furosemide (LASIX) 20 MG tablet Take 1 tablet (20 mg total) by mouth daily.   HYDROcodone-acetaminophen (NORCO) 5-325 MG tablet Take 1 tablet by mouth every 6 (six) hours as needed for moderate pain.   HYDROcodone-acetaminophen (NORCO) 5-325 MG tablet Take 1 tablet by mouth every 6 (six) hours as needed for moderate pain.   hydrocortisone 2.5 % cream APPLY TO AFFECTED AREA TWICE A DAY   isosorbide mononitrate (IMDUR) 60 MG 24 hr tablet TAKE 1 TABLET (60 MG TOTAL) DAILY BY MOUTH.   levothyroxine (SYNTHROID) 75 MCG tablet Take 1 tablet (75 mcg total) by mouth daily before breakfast.   loratadine (CLARITIN) 10 MG tablet TAKE 1 TABLET BY MOUTH EVERY DAY   metoprolol tartrate (LOPRESSOR) 100 MG tablet Take 1  tablet (100 mg total) by mouth 2 (two) times daily.   nitroGLYCERIN (NITROSTAT) 0.4 MG SL tablet PLACE 1 TABLET (0.4 MG TOTAL) UNDER THE TONGUE EVERY 5 (FIVE) MINUTES AS NEEDED FOR CHEST PAIN.   predniSONE (STERAPRED UNI-PAK 21 TAB) 10 MG (21) TBPK tablet As directed x 6 days   rosuvastatin (CRESTOR) 20 MG tablet TAKE 1 TABLET BY MOUTH EVERY DAY IN THE EVENING   Zoledronic Acid (RECLAST IV) Inject into the vein. Once yearly   No  facility-administered encounter medications on file as of 02/26/2021.    Allergies (verified) Sulfa antibiotics   History: Past Medical History:  Diagnosis Date   Aneurysm (HCC)    Aortic insufficiency    Atrial fibrillation (HCC)    CAD (coronary artery disease)    CABG LIMA to LAD,vein graft to diagonal,vein graft to OM   Dyslipidemia    GERD (gastroesophageal reflux disease)    Hypothyroid    Past Surgical History:  Procedure Laterality Date   ABDOMINAL HYSTERECTOMY     BACK SURGERY  2009   CORONARY ANGIOPLASTY  2003   cutting balloon atherectomy in-stent stenosis of the LAD.   CORONARY ANGIOPLASTY WITH STENT PLACEMENT  2002   remote PCI & stenting LAD,   CORONARY ARTERY BYPASS GRAFT  2004   LIMA to LAD,vein graft to diagonal,vein graft to OM   FOOT SURGERY     LEFT HEART CATH AND CORS/GRAFTS ANGIOGRAPHY N/A 02/23/2017   Procedure: LEFT HEART CATH AND CORS/GRAFTS ANGIOGRAPHY;  Surgeon: Iran Ouch, MD;  Location: [facility];  Service: Cardiovascular;  Laterality: N/A;   LEFT HEART CATHETERIZATION WITH CORONARY ANGIOGRAM N/A 12/15/2011   Procedure: LEFT HEART CATHETERIZATION WITH CORONARY ANGIOGRAM;  Surgeon: Thurmon Fair, MD;  Location: [facility];  Service: Cardiovascular;  Laterality: N/A;   LOOP RECORDER INSERTION N/A 06/14/2018   Procedure: LOOP RECORDER INSERTION;  Surgeon: Thurmon Fair, MD;  Location: [facility];  Service: Cardiovascular;  Laterality: N/A;   NECK SURGERY     NM MYOCAR PERF WALL MOTION  12/01/2011   normal study-persistent exercise induced ECG changes raises the concern for ischemia (balanced ischemia).   NOSE SURGERY     PACEMAKER INSERTION     TONSILLECTOMY  1948   TUBAL LIGATION     Family History  Problem Relation Age of Onset   Heart attack Mother    Heart failure Father    Hypertension Father    Heart disease Father    Asthma Maternal Grandmother    Heart disease Sister    Stroke Brother    Voice disorder  Daughter        vocal dystonia   Stroke Daughter    Stroke Son    Cancer Sister 52   Social History   Socioeconomic History   Marital status: Widowed    Spouse name: Not on file   Number of children: 2   Years of education: Associates   Highest education level: Associate degree: academic program  Occupational History   Occupation: Retired  Tobacco Use   Smoking status: Never   Smokeless tobacco: Never  Building services engineer Use: Never used  Substance and Sexual Activity   Alcohol use: No   Drug use: No   Sexual activity: Not Currently  Other Topics Concern   Not on file  Social History Narrative   Mother of 2 with 4 grandchildren   Right-handed   Caffeine: 8-10 cups coffee   Social Determinants of  Health   Financial Resource Strain: Low Risk    Difficulty of Paying Living Expenses: Not hard at all  Food Insecurity: No Food Insecurity   Worried About Running Out of Food in the Last Year: Never true   Ran Out of Food in the Last Year: Never true  Transportation Needs: No Transportation Needs   Lack of Transportation (Medical): No   Lack of Transportation (Non-Medical): No  Physical Activity: Sufficiently Active   Days of Exercise per Week: 5 days   Minutes of Exercise per Session: 30 min  Stress: No Stress Concern Present   Feeling of Stress : Only a little  Social Connections: Moderately Integrated   Frequency of Communication with Friends and Family: More than three times a week   Frequency of Social Gatherings with Friends and Family: Once a week   Attends Religious Services: 1 to 4 times per year   Active Member of [employer] or Organizations: Yes   Attends Banker Meetings: More than 4 times per year   Marital Status: Widowed    Tobacco Counseling Counseling given: Not Answered   Clinical Intake:  Pre-visit preparation completed: Yes  Pain : No/denies pain     BMI - recorded: 21.95 Nutritional Status: BMI of 19-24  Normal Nutritional  Risks: None Diabetes: No  How often do you need to have someone help you when you read instructions, pamphlets, or other written materials from your doctor or pharmacy?: 1 - Never  Diabetic? No  Interpreter Needed?: No  Information entered by :: Tomasz Steeves, LPN   Activities of Daily Living In your present state of health, do you have any difficulty performing the following activities: 02/26/2021  Hearing? Y  Comment checking into getting hearing aids soon  Vision? Y  Comment scheduled for cataract surgery  Difficulty concentrating or making decisions? N  Walking or climbing stairs? N  Dressing or bathing? N  Doing errands, shopping? N  Preparing Food and eating ? N  Using the Toilet? N  In the past six months, have you accidently leaked urine? N  Do you have problems with loss of bowel control? N  Managing your Medications? N  Managing your Finances? N  Housekeeping or managing your Housekeeping? N  Some recent data might be hidden    Patient Care Team: Bennie Pierini, FNP as PCP - General (Family Medicine) Croitoru, Rachelle Hora, MD as PCP - Cardiology (Cardiology) Regan Lemming, MD as PCP - Electrophysiology (Cardiology) Roma Kayser, MD as Consulting Physician (Endocrinology)  Indicate any recent Medical Services you may have received from other than [facility] providers in the past year (date may be approximate).     Assessment:   This is a routine wellness examination for Stephen.  Hearing/Vision screen Hearing Screening - Comments:: Moderate hearing difficulties - looking into getting hearing aids soon Vision Screening - Comments:: Wears glasses - has cataract surgery scheduled soon - up to date with annual eye exams with Dr Nile Riggs  Dietary issues and exercise activities discussed: Current Exercise Habits: Home exercise routine, Type of exercise: walking, Time (Minutes): 30, Frequency (Times/Week): 5, Weekly Exercise (Minutes/Week): 150, Intensity:  Mild, Exercise limited by: cardiac condition(s)   Goals Addressed               This Visit's Progress     DIET - EAT MORE FRUITS AND VEGETABLES (pt-stated)   On track     Cut out snacking on salty foods and try more healthy options.  Exercise 150 min/wk Moderate Activity   On track      Depression Screen PHQ 2/9 Scores 02/26/2021 12/24/2020 09/23/2020 07/31/2020 06/26/2020 06/26/2020 03/25/2020  PHQ - 2 Score 0 0 0 0 0 0 0  PHQ- 9 Score - 0 - - - - -    Fall Risk Fall Risk  02/26/2021 12/24/2020 09/23/2020 07/31/2020 06/26/2020  Falls in the past year? 0 0 0 0 0  Number falls in past yr: 0 - - - 0  Injury with Fall? 0 - - - 0  Risk for fall due to : No Fall Risks - - - No Fall Risks  Follow up Falls prevention discussed - - - Education provided    FALL RISK PREVENTION PERTAINING TO THE HOME:  Any stairs in or around the home? Yes  If so, are there any without handrails? No  Home free of loose throw rugs in walkways, pet beds, electrical cords, etc? Yes  Adequate lighting in your home to reduce risk of falls? Yes   ASSISTIVE DEVICES UTILIZED TO PREVENT FALLS:  Life alert? No  Use of a cane, walker or w/c? No  Grab bars in the bathroom? Yes  Shower chair or bench in shower? Yes  Elevated toilet seat or a handicapped toilet? Yes   TIMED UP AND GO:  Was the test performed? No . Telephonic visit  Cognitive Function: Normal cognitive status assessed by direct observation by this Nurse Health Advisor. No abnormalities found.   MMSE - Mini Mental State Exam 01/11/2018  Orientation to time 5  Orientation to Place 5  Registration 3  Attention/ Calculation 5  Recall 2  Language- name 2 objects 2  Language- repeat 1  Language- follow 3 step command 3  Language- read & follow direction 1  Write a sentence 1  Copy design 1  Total score 29     6CIT Screen 02/26/2020 01/18/2019  What Year? 0 points 0 points  What month? 0 points 0 points  What time? 0 points 0 points   Count back from 20 0 points 0 points  Months in reverse 0 points 0 points  Repeat phrase 0 points -  Total Score 0 -    Immunizations Immunization History  Administered Date(s) Administered   Fluad Quad(high Dose 65+) 02/29/2020   Influenza, High Dose Seasonal PF 03/09/2016, 03/02/2017   Moderna Sars-Covid-2 Vaccination 01/02/2020, 01/30/2020   Pneumococcal Conjugate-13 01/11/2018    TDAP status: Due, Education has been provided regarding the importance of this vaccine. Advised may receive this vaccine at local pharmacy or Health Dept. Aware to provide a copy of the vaccination record if obtained from local pharmacy or Health Dept. Verbalized acceptance and understanding.  Flu Vaccine status: Due, Education has been provided regarding the importance of this vaccine. Advised may receive this vaccine at local pharmacy or Health Dept. Aware to provide a copy of the vaccination record if obtained from local pharmacy or Health Dept. Verbalized acceptance and understanding.  Pneumococcal vaccine status: Due, Education has been provided regarding the importance of this vaccine. Advised may receive this vaccine at local pharmacy or Health Dept. Aware to provide a copy of the vaccination record if obtained from local pharmacy or Health Dept. Verbalized acceptance and understanding.  Covid-19 vaccine status: Completed vaccines  Qualifies for Shingles Vaccine? Yes   Zostavax completed No   Shingrix Completed?: No.    Education has been provided regarding the importance of this vaccine. Patient has been advised to call insurance company  to determine out of pocket expense if they have not yet received this vaccine. Advised may also receive vaccine at local pharmacy or Health Dept. Verbalized acceptance and understanding.  Screening Tests Health Maintenance  Topic Date Due   DEXA SCAN  01/06/2018   COVID-19 Vaccine (3 - Moderna risk series) 02/27/2020   INFLUENZA VACCINE  12/15/2020   Zoster  Vaccines- Shingrix (1 of 2) 03/26/2021 (Originally 07/25/1959)   TETANUS/TDAP  01/12/2023   HPV VACCINES  Aged Out    Health Maintenance  Health Maintenance Due  Topic Date Due   DEXA SCAN  01/06/2018   COVID-19 Vaccine (3 - Moderna risk series) 02/27/2020   INFLUENZA VACCINE  12/15/2020    Colorectal cancer screening: No longer required.   Mammogram status: No longer required due to age.  Bone Density status: Completed 01/07/2016. Results reflect: Bone density results: OSTEOPOROSIS. Repeat every 2 years.  Lung Cancer Screening: (Low Dose CT Chest recommended if Age 57-80 years, 30 pack-year currently smoking OR have quit w/in 15years.) does not qualify  Additional Screening:  Hepatitis C Screening: does not qualify  Vision Screening: Recommended annual ophthalmology exams for early detection of glaucoma and other disorders of the eye. Is the patient up to date with their annual eye exam?  Yes  Who is the provider or what is the name of the office in which the patient attends annual eye exams? [facility] If pt is not established with a provider, would they like to be referred to a provider to establish care? No .   Dental Screening: Recommended annual dental exams for proper oral hygiene  Community Resource Referral / Chronic Care Management: CRR required this visit?  No   CCM required this visit?  No      Plan:     I have personally reviewed and noted the following in the patient's chart:   Medical and social history Use of alcohol, tobacco or illicit drugs  Current medications and supplements including opioid prescriptions.  Functional ability and status Nutritional status Physical activity Advanced directives List of other physicians Hospitalizations, surgeries, and ER visits in previous 12 months Vitals Screenings to include cognitive, depression, and falls Referrals and appointments  In addition, I have reviewed and discussed with patient certain preventive  protocols, quality metrics, and best practice recommendations. A written personalized care plan for preventive services as well as general preventive health recommendations were provided to patient.     [place] Constable, LPN   [id number]   Nurse Notes: None

## 2021-03-12 LAB — CUP PACEART REMOTE DEVICE CHECK
Date Time Interrogation Session: 20221027001403
Implantable Pulse Generator Implant Date: 20200129

## 2021-03-23 ENCOUNTER — Ambulatory Visit (INDEPENDENT_AMBULATORY_CARE_PROVIDER_SITE_OTHER): Payer: Medicare HMO

## 2021-03-23 DIAGNOSIS — I480 Paroxysmal atrial fibrillation: Secondary | ICD-10-CM | POA: Diagnosis not present

## 2021-03-27 ENCOUNTER — Other Ambulatory Visit: Payer: Self-pay

## 2021-03-27 ENCOUNTER — Encounter: Payer: Self-pay | Admitting: Nurse Practitioner

## 2021-03-27 ENCOUNTER — Ambulatory Visit: Payer: Medicare HMO | Admitting: Nurse Practitioner

## 2021-03-27 VITALS — BP 124/63 | HR 58 | Temp 97.3°F | Resp 20 | Ht 62.0 in | Wt 121.0 lb

## 2021-03-27 DIAGNOSIS — I480 Paroxysmal atrial fibrillation: Secondary | ICD-10-CM

## 2021-03-27 DIAGNOSIS — Z23 Encounter for immunization: Secondary | ICD-10-CM

## 2021-03-27 DIAGNOSIS — F419 Anxiety disorder, unspecified: Secondary | ICD-10-CM

## 2021-03-27 DIAGNOSIS — I10 Essential (primary) hypertension: Secondary | ICD-10-CM

## 2021-03-27 DIAGNOSIS — E782 Mixed hyperlipidemia: Secondary | ICD-10-CM

## 2021-03-27 DIAGNOSIS — I7121 Aneurysm of the ascending aorta, without rupture: Secondary | ICD-10-CM

## 2021-03-27 DIAGNOSIS — I25718 Atherosclerosis of autologous vein coronary artery bypass graft(s) with other forms of angina pectoris: Secondary | ICD-10-CM

## 2021-03-27 DIAGNOSIS — M5030 Other cervical disc degeneration, unspecified cervical region: Secondary | ICD-10-CM

## 2021-03-27 DIAGNOSIS — E039 Hypothyroidism, unspecified: Secondary | ICD-10-CM

## 2021-03-27 MED ORDER — HYDROCODONE-ACETAMINOPHEN 5-325 MG PO TABS: 1.0000 | ORAL_TABLET | Freq: Four times a day (QID) | ORAL | 0 refills | Status: DC | PRN

## 2021-03-27 NOTE — Patient Instructions (Signed)
Chronic Back Pain When back pain lasts longer than 3 months, it is called chronic back pain. Pain may get worse at certain times (flare-ups). There are things you can do at home to manage your pain. Follow these instructions at home: Pay attention to any changes in your symptoms. Take these actions to help with your pain: Managing pain and stiffness   If told, put ice on the painful area. Your doctor may tell you to use ice for 24-48 hours after the flare-up starts. To do this: Put ice in a plastic bag. Place a towel between your skin and the bag. Leave the ice on for 20 minutes, 2-3 times a day. If told, put heat on the painful area. Do this as often as told by your doctor. Use the heat source that your doctor recommends, such as a moist heat pack or a heating pad. Place a towel between your skin and the heat source. Leave the heat on for 20-30 minutes. Take off the heat if your skin turns bright red. This is especially important if you are unable to feel pain, heat, or cold. You may have a greater risk of getting burned. Soak in a warm bath. This can help relieve pain. Activity  Avoid bending and other activities that make pain worse. When standing: Keep your upper back and neck straight. Keep your shoulders pulled back. Avoid slouching. When sitting: Keep your back straight. Relax your shoulders. Do not round your shoulders or pull them backward. Do not sit or stand in one place for long periods of time. Take short rest breaks during the day. Lying down or standing is usually better than sitting. Resting can help relieve pain. When sitting or lying down for a long time, do some mild activity or stretching. This will help to prevent stiffness and pain. Get regular exercise. Ask your doctor what activities are safe for you. Do not lift anything that is heavier than 10 lb (4.5 kg) or the limit that you are told, until your doctor says that it is safe. To prevent injury when you lift  things: Bend your knees. Keep the weight close to your body. Avoid twisting. Sleep on a firm mattress. Try lying on your side with your knees slightly bent. If you lie on your back, put a pillow under your knees. Medicines Treatment may include medicines for pain and swelling taken by mouth or put on the skin, prescription pain medicine, or muscle relaxants. Take over-the-counter and prescription medicines only as told by your doctor. Ask your doctor if the medicine prescribed to you: Requires you to avoid driving or using machinery. Can cause trouble pooping (constipation). You may need to take these actions to prevent or treat trouble pooping: Drink enough fluid to keep your pee (urine) pale yellow. Take over-the-counter or prescription medicines. Eat foods that are high in fiber. These include beans, whole grains, and fresh fruits and vegetables. Limit foods that are high in fat and sugars. These include fried or sweet foods. General instructions Do not use any products that contain nicotine or tobacco, such as cigarettes, e-cigarettes, and chewing tobacco. If you need help quitting, ask your doctor. Keep all follow-up visits as told by your doctor. This is important. Contact a doctor if: Your pain does not get better with rest or medicine. Your pain gets worse, or you have new pain. You have a high fever. You lose weight very quickly. You have trouble doing your normal activities. Get help right away if: One   or both of your legs or feet feel weak. One or both of your legs or feet lose feeling (have numbness). You have trouble controlling when you poop (have a bowel movement) or pee (urinate). You have bad back pain and: You feel like you may vomit (nauseous), or you vomit. You have pain in your belly (abdomen). You have shortness of breath. You faint. Summary When back pain lasts longer than 3 months, it is called chronic back pain. Pain may get worse at certain times  (flare-ups). Use ice and heat as told by your doctor. Your doctor may tell you to use ice after flare-ups. This information is not intended to replace advice given to you by your health care provider. Make sure you discuss any questions you have with your health care provider. Document Revised: 06/13/2019 Document Reviewed: 06/13/2019 Elsevier Patient Education  2022 [employer]  

## 2021-03-27 NOTE — Progress Notes (Signed)
Subjective:    Patient ID: Evelyn [name], female    DOB: Jul 29, 1940, 80 y.o.   MRN: [id number]   Chief Complaint: medical management of chronic issues     HPI:  1. Essential hypertension No c/o chest pain, SOBor headache. Does not check blood pressure at home. BP Readings from Last 3 Encounters:  03/27/21 124/63  02/04/21 (!) 144/70  12/24/20 (!) 143/63     2. 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 Is on imdur daily. Last saw cardiology on 02/04/21. Reviewing office note no changes were made to plan of care.  3. Paroxysmal atrial fibrillation (HCC) Denies palpitations or heart racing. Is on cardizem and lopressor for rate control. Is on eliquis to prevent clot formation. Denies ay bleeding  4. Aneurysm of ascending aorta without rupture Had angio of aorta on 11/15/19. Aneurysm measured 34m which was unchanged from previous scan.  5. Acquired hypothyroidism No problems that she is aware of Lab Results  Component Value Date   TSH 1.500 11/07/2020     6. Anxiety Has anxiety but is not taking anything GAD 7 : Generalized Anxiety Score 03/27/2021 12/24/2020 09/23/2020 06/26/2020  Nervous, Anxious, on Edge 0 0 0 0  Control/stop worrying 0 0 0 -  Worry too much - different things 0 0 0 0  Trouble relaxing 0 0 0 1  Restless 0 0 0 0  Easily annoyed or irritable 0 0 0 0  Afraid - awful might happen 0 0 0 0  Total GAD 7 Score 0 0 0 -  Anxiety Difficulty Not difficult at all Not difficult at all Not difficult at all Not difficult at all      7. Mixed hyperlipidemia Tries tor watch diet and stay very active. Lab Results  Component Value Date   CHOL 101 06/26/2020   HDL 53 06/26/2020   LDLCALC 35 06/26/2020   TRIG 53 06/26/2020   CHOLHDL 1.9 06/26/2020     8. DDD (degenerative disc disease), cervical Pain assessment: Cause of pain- DDD Pain location- lower back Pain on scale of  1-10- 0/10 currently Frequency- not daily but several times a wekk What increases pain-nothing What makes pain Better-rest Effects on ADL - none Any change in general medical condition-none  Current opioids rx- norco 5/325 # meds rx- 120 lasts her 3 months Effectiveness of current meds-helps with pain Adverse reactions from pain meds-none Morphine equivalent- 20MME  Pill count performed-No Last drug screen - 03/25/20 ( high risk [id number] risk [id number] risk yearly ) Urine drug screen today- Yes Was [facility]- yes  If yes were their any concerning findings? - none   Overdose risk: 1    Pain contract signed on:09/24/20     Outpatient Encounter Medications as of 03/27/2021  Medication Sig   amoxicillin (AMOXIL) 500 MG capsule Take 1,000 mg by mouth 2 (two) times daily.   apixaban (ELIQUIS) 2.5 MG TABS tablet Take 1 tablet (2.5 mg total) by mouth 2 (two) times daily.   cyclobenzaprine (FLEXERIL) 5 MG tablet Take 1 tablet (5 mg total) by mouth 3 (three) times daily as needed for muscle spasms.   diclofenac Sodium (VOLTAREN) 1 % GEL APPLY 4 G TOPICALLY 4 TIMES DAILY   diltiazem (CARDIZEM) 30 MG tablet Take 1 tablet (30 mg total) by mouth every 6 (six) hours as needed (for rapid heart rate).   furosemide (LASIX) 20 MG tablet Take  1 tablet (20 mg total) by mouth daily.   HYDROcodone-acetaminophen (NORCO) 5-325 MG tablet Take 1 tablet by mouth every 6 (six) hours as needed for moderate pain.   hydrocortisone 2.5 % cream APPLY TO AFFECTED AREA TWICE A DAY   isosorbide mononitrate (IMDUR) 60 MG 24 hr tablet TAKE 1 TABLET (60 MG TOTAL) DAILY BY MOUTH.   levothyroxine (SYNTHROID) 75 MCG tablet Take 1 tablet (75 mcg total) by mouth daily before breakfast.   loratadine (CLARITIN) 10 MG tablet TAKE 1 TABLET BY MOUTH EVERY DAY   metoprolol tartrate (LOPRESSOR) 100 MG tablet Take 1 tablet (100 mg total) by mouth 2 (two) times daily.   nitroGLYCERIN (NITROSTAT) 0.4 MG SL tablet  PLACE 1 TABLET (0.4 MG TOTAL) UNDER THE TONGUE EVERY 5 (FIVE) MINUTES AS NEEDED FOR CHEST PAIN.   predniSONE (STERAPRED UNI-PAK 21 TAB) 10 MG (21) TBPK tablet As directed x 6 days   rosuvastatin (CRESTOR) 20 MG tablet TAKE 1 TABLET BY MOUTH EVERY DAY IN THE EVENING   Zoledronic Acid (RECLAST IV) Inject into the vein. Once yearly   No facility-administered encounter medications on file as of 03/27/2021.    Past Surgical History:  Procedure Laterality Date   ABDOMINAL HYSTERECTOMY     BACK SURGERY  2009   CORONARY ANGIOPLASTY  2003   cutting balloon atherectomy in-stent stenosis of the LAD.   CORONARY ANGIOPLASTY WITH STENT PLACEMENT  2002   remote PCI & stenting LAD,   CORONARY ARTERY BYPASS GRAFT  2004   LIMA to LAD,vein graft to diagonal,vein graft to OM   FOOT SURGERY     LEFT HEART CATH AND CORS/GRAFTS ANGIOGRAPHY N/A 02/23/2017   Procedure: LEFT HEART CATH AND CORS/GRAFTS ANGIOGRAPHY;  Surgeon: Wellington Hampshire, MD;  Location: [facility];  Service: Cardiovascular;  Laterality: N/A;   LEFT HEART CATHETERIZATION WITH CORONARY ANGIOGRAM N/A 12/15/2011   Procedure: LEFT HEART CATHETERIZATION WITH CORONARY ANGIOGRAM;  Surgeon: Sanda Klein, MD;  Location: [facility];  Service: Cardiovascular;  Laterality: N/A;   LOOP RECORDER INSERTION N/A 06/14/2018   Procedure: LOOP RECORDER INSERTION;  Surgeon: Sanda Klein, MD;  Location: [facility];  Service: Cardiovascular;  Laterality: N/A;   NECK SURGERY     NM MYOCAR PERF WALL MOTION  12/01/2011   normal study-persistent exercise induced ECG changes raises the concern for ischemia (balanced ischemia).   NOSE SURGERY     PACEMAKER INSERTION     TONSILLECTOMY  1948   TUBAL LIGATION      Family History  Problem Relation Age of Onset   Heart attack Mother    Heart failure Father    Hypertension Father    Heart disease Father    Asthma Maternal Grandmother    Heart disease Sister    Stroke Brother    Voice disorder  Daughter        vocal dystonia   Stroke Daughter    Stroke Son    Cancer Sister 68    New complaints: None today  Social history: Lives by herself. Her family check son her daily     Review of Systems  Constitutional:  Negative for diaphoresis.  Eyes:  Negative for pain.  Respiratory:  Negative for shortness of breath.   Cardiovascular:  Negative for chest pain, palpitations and leg swelling.  Gastrointestinal:  Negative for abdominal pain.  Endocrine: Negative for polydipsia.  Musculoskeletal:  Positive for back pain.  Skin:  Negative for rash.  Neurological:  Negative for  dizziness, weakness and headaches.  Hematological:  Does not bruise/bleed easily.  All other systems reviewed and are negative.     Objective:   Physical Exam Vitals and nursing note reviewed.  Constitutional:      General: She is not in acute distress.    Appearance: Normal appearance. She is well-developed.  HENT:     Head: Normocephalic.     Right Ear: Tympanic membrane normal.     Left Ear: Tympanic membrane normal.     Nose: Nose normal.     Mouth/Throat:     Mouth: Mucous membranes are moist.  Eyes:     Pupils: Pupils are equal, round, and reactive to light.  Neck:     Vascular: No carotid bruit or JVD.  Cardiovascular:     Rate and Rhythm: Normal rate and regular rhythm.     Heart sounds: Normal heart sounds.  Pulmonary:     Effort: Pulmonary effort is normal. No respiratory distress.     Breath sounds: Normal breath sounds. No wheezing or rales.  Chest:     Chest wall: No tenderness.  Abdominal:     General: Bowel sounds are normal. There is no distension or abdominal bruit.     Palpations: Abdomen is soft. There is no hepatomegaly, splenomegaly, mass or pulsatile mass.     Tenderness: There is no abdominal tenderness.  Musculoskeletal:        General: Normal range of motion.     Cervical back: Normal range of motion and neck supple.  Lymphadenopathy:     Cervical: No cervical  adenopathy.  Skin:    General: Skin is warm and dry.  Neurological:     Mental Status: She is alert and oriented to person, place, and time.     Deep Tendon Reflexes: Reflexes are normal and symmetric.  Psychiatric:        Behavior: Behavior normal.        Thought Content: Thought content normal.        Judgment: Judgment normal.    BP 124/63   Pulse (!) 58   Temp (!) 97.3 F (36.3 C) (Temporal)   Resp 20   Ht 5' 2"  (1.575 m)   Wt 121 lb (54.9 kg)   SpO2 94%   BMI 22.13 kg/m        Assessment & Plan:  Evelyn [name] comes in today with chief complaint of Medical Management of Chronic Issues   Diagnosis and orders addressed:  1. Essential hypertension Low sodium diet - CBC with Differential/Platelet - CMP14+EGFR  2. 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 Paroxysmal atrial fibrillation (HCC) Avoid caffeine  4. Aneurysm of ascending aorta without rupture Will repeat scan next year  5. Acquired hypothyroidism Labs pending - Thyroid Panel With TSH  6. Anxiety Stress management  7. Mixed hyperlipidemia Low fat diet - Lipid panel  8. DDD (degenerative disc disease), cervical Moist heat and rest - HYDROcodone-acetaminophen (NORCO) 5-325 MG tablet; Take 1 tablet by mouth every 6 (six) hours as needed for moderate pain.  Dispense: 120 tablet; Refill: 0 - [employer] Select 13 (MW), Urine   Labs pending Health Maintenance reviewed Diet and exercise encouraged  Follow up plan: 3 months   Mary-Margaret Hassell Done, FNP

## 2021-03-28 LAB — CBC WITH DIFFERENTIAL/PLATELET
Basophils Absolute: 0 10*3/uL (ref 0.0–0.2)
Basos: 0 %
EOS (ABSOLUTE): 0.1 10*3/uL (ref 0.0–0.4)
Eos: 1 %
Hematocrit: 42.3 % (ref 34.0–46.6)
Hemoglobin: 13.8 g/dL (ref 11.1–15.9)
Immature Grans (Abs): 0 10*3/uL (ref 0.0–0.1)
Immature Granulocytes: 0 %
Lymphocytes Absolute: 3.3 10*3/uL — ABNORMAL HIGH (ref 0.7–3.1)
Lymphs: 35 %
MCH: 31 pg (ref 26.6–33.0)
MCHC: 32.6 g/dL (ref 31.5–35.7)
MCV: 95 fL (ref 79–97)
Monocytes Absolute: 0.7 10*3/uL (ref 0.1–0.9)
Monocytes: 8 %
Neutrophils Absolute: 5.3 10*3/uL (ref 1.4–7.0)
Neutrophils: 56 %
Platelets: 230 10*3/uL (ref 150–450)
RBC: 4.45 x10E6/uL (ref 3.77–5.28)
RDW: 13.2 % (ref 11.7–15.4)
WBC: 9.5 10*3/uL (ref 3.4–10.8)

## 2021-03-28 LAB — LIPID PANEL
Chol/HDL Ratio: 2 ratio (ref 0.0–4.4)
Cholesterol, Total: 126 mg/dL (ref 100–199)
HDL: 64 mg/dL (ref >39)
LDL Chol Calc (NIH): 48 mg/dL (ref 0–99)
Triglycerides: 65 mg/dL (ref 0–149)
VLDL Cholesterol Cal: 14 mg/dL (ref 5–40)

## 2021-03-28 LAB — CMP14+EGFR
ALT: 15 IU/L (ref 0–32)
AST: 19 IU/L (ref 0–40)
Albumin/Globulin Ratio: 1.9 (ref 1.2–2.2)
Albumin: 4.6 g/dL (ref 3.6–4.6)
Alkaline Phosphatase: 65 IU/L (ref 44–121)
BUN/Creatinine Ratio: 15 (ref 12–28)
BUN: 12 mg/dL (ref 8–27)
Bilirubin Total: 0.4 mg/dL (ref 0.0–1.2)
CO2: 25 mmol/L (ref 20–29)
Calcium: 9.7 mg/dL (ref 8.7–10.3)
Chloride: 101 mmol/L (ref 96–106)
Creatinine, Ser: 0.78 mg/dL (ref 0.57–1.00)
Globulin, Total: 2.4 g/dL (ref 1.5–4.5)
Glucose: 88 mg/dL (ref 70–99)
Potassium: 4.1 mmol/L (ref 3.5–5.2)
Sodium: 142 mmol/L (ref 134–144)
Total Protein: 7 g/dL (ref 6.0–8.5)
eGFR: 76 mL/min/{1.73_m2} (ref >59)

## 2021-03-28 LAB — THYROID PANEL WITH TSH
Free Thyroxine Index: 2.8 (ref 1.2–4.9)
T3 Uptake Ratio: 27 % (ref 24–39)
T4, Total: 10.2 ug/dL (ref 4.5–12.0)
TSH: 1.54 u[IU]/mL (ref 0.450–4.500)

## 2021-03-29 ENCOUNTER — Other Ambulatory Visit: Payer: Self-pay | Admitting: Nurse Practitioner

## 2021-03-29 DIAGNOSIS — M5030 Other cervical disc degeneration, unspecified cervical region: Secondary | ICD-10-CM

## 2021-03-31 NOTE — Progress Notes (Signed)
Carelink Summary Report / Loop Recorder 

## 2021-04-02 LAB — TOXASSURE SELECT 13 (MW), URINE

## 2021-04-22 LAB — CUP PACEART REMOTE DEVICE CHECK
Date Time Interrogation Session: 20221128235223
Implantable Pulse Generator Implant Date: 20200129

## 2021-04-27 ENCOUNTER — Ambulatory Visit (INDEPENDENT_AMBULATORY_CARE_PROVIDER_SITE_OTHER): Payer: Medicare HMO

## 2021-04-27 DIAGNOSIS — I480 Paroxysmal atrial fibrillation: Secondary | ICD-10-CM | POA: Diagnosis not present

## 2021-04-28 ENCOUNTER — Encounter (HOSPITAL_COMMUNITY)
Admission: RE | Admit: 2021-04-28 | Discharge: 2021-04-28 | Disposition: A | Discharge status: Home / Self Care | Payer: Medicare HMO | Source: Ambulatory Visit | Attending: Nurse Practitioner | Admitting: Nurse Practitioner

## 2021-05-07 NOTE — Progress Notes (Signed)
Carelink Summary Report / Loop Recorder 

## 2021-05-26 ENCOUNTER — Encounter (HOSPITAL_COMMUNITY)
Admission: RE | Admit: 2021-05-26 | Discharge: 2021-05-26 | Disposition: A | Discharge status: Home / Self Care | Payer: Medicare HMO | Source: Ambulatory Visit | Attending: Nurse Practitioner | Admitting: Nurse Practitioner

## 2021-05-26 ENCOUNTER — Encounter (HOSPITAL_COMMUNITY): Payer: Self-pay

## 2021-05-26 VITALS — BP 148/65 | HR 71 | Temp 97.6°F | Resp 18 | Ht 62.0 in | Wt 121.0 lb

## 2021-05-26 DIAGNOSIS — M810 Age-related osteoporosis without current pathological fracture: Secondary | ICD-10-CM | POA: Diagnosis present

## 2021-05-26 DIAGNOSIS — M5030 Other cervical disc degeneration, unspecified cervical region: Secondary | ICD-10-CM

## 2021-05-26 LAB — COMPREHENSIVE METABOLIC PANEL
ALT: 14 U/L (ref 0–44)
AST: 16 U/L (ref 15–41)
Albumin: 4 g/dL (ref 3.5–5.0)
Alkaline Phosphatase: 55 U/L (ref 38–126)
Anion gap: 6 (ref 5–15)
BUN: 9 mg/dL (ref 8–23)
CO2: 26 mmol/L (ref 22–32)
Calcium: 9.1 mg/dL (ref 8.9–10.3)
Chloride: 109 mmol/L (ref 98–111)
Creatinine, Ser: 0.58 mg/dL (ref 0.44–1.00)
GFR, Estimated: >60 mL/min (ref >60)
Glucose, Bld: 94 mg/dL (ref 70–99)
Potassium: 3.8 mmol/L (ref 3.5–5.1)
Sodium: 141 mmol/L (ref 135–145)
Total Bilirubin: 0.5 mg/dL (ref 0.3–1.2)
Total Protein: 6.7 g/dL (ref 6.5–8.1)

## 2021-05-26 MED ORDER — SODIUM CHLORIDE 0.9 % IV SOLN
INTRAVENOUS | Status: DC
  Administered 2021-05-26: 250 mL via INTRAVENOUS

## 2021-05-26 MED ORDER — ZOLEDRONIC ACID 5 MG/100ML IV SOLN
5.0000 mg | Freq: Once | INTRAVENOUS | Status: AC
  Administered 2021-05-26: 5 mg via INTRAVENOUS
  Filled 2021-05-26: qty 100

## 2021-06-01 ENCOUNTER — Ambulatory Visit (INDEPENDENT_AMBULATORY_CARE_PROVIDER_SITE_OTHER): Payer: Medicare HMO

## 2021-06-01 DIAGNOSIS — I480 Paroxysmal atrial fibrillation: Secondary | ICD-10-CM | POA: Diagnosis not present

## 2021-06-01 LAB — CUP PACEART REMOTE DEVICE CHECK
Date Time Interrogation Session: 20230115232440
Implantable Pulse Generator Implant Date: 20200129

## 2021-06-08 ENCOUNTER — Telehealth: Payer: Self-pay | Admitting: Cardiovascular Disease

## 2021-06-08 NOTE — Telephone Encounter (Signed)
Patient c/o Palpitations:  High priority if patient c/o lightheadedness, shortness of breath, or chest pain  How long have you had palpitations/irregular HR/ Afib? Symptoms lasted for about two hours this morning. Started at 2 am but resolved  Are you having the symptoms now? no  Are you currently experiencing lightheadedness, SOB or CP?   Do you have a history of afib (atrial fibrillation) or irregular heart rhythm? yes  Have you checked your BP or HR? (document readings if available):   6:30 am: 110/   HR 53 (pt does not remember the bottom #)   Are you experiencing any other symptoms? Patient was nauseated but that has subsided   Patient said she gets spells of tachycardia that last for a couple hours at a time. She has had a couple spells since around Christmas. She wanted to know if she should have an appointment with Dr. Salena Saner sooner as it has been happening more frequently.   Patient will be at another appt and will be out of the house from ~1:00 pm-3:00 pm today

## 2021-06-08 NOTE — Telephone Encounter (Signed)
Called pt no answer at this time

## 2021-06-09 ENCOUNTER — Other Ambulatory Visit: Payer: Self-pay | Admitting: Nurse Practitioner

## 2021-06-09 DIAGNOSIS — E782 Mixed hyperlipidemia: Secondary | ICD-10-CM

## 2021-06-09 NOTE — Telephone Encounter (Signed)
Please call her cell phone at [phone] after 12:00 pm

## 2021-06-09 NOTE — Telephone Encounter (Signed)
Returned call to pt to notify of Dr. Erin Hearing note. No answer. No voicemail.

## 2021-06-09 NOTE — Telephone Encounter (Signed)
Patient returning call from yesterday. Please call before 12:00. Patient will not be at home .

## 2021-06-09 NOTE — Telephone Encounter (Signed)
Pt states that she feels fine today. She states that on Monday morning at 2 am she began to feel palpitations that woke her from her sleep. She reports having chest and back pain and nausea at this time. Pt denies SOB at that time. She report taking Nitro and diltiazem and did feel some relief within an hour. Pt reports that she has had these episodes before and this one lasted longer that normal. Pt also reports that she also has episodes where her fingers go numb and turn blue. She wanted the doctor to know to r/o a blockage. Pt wants to know if she needs to be seen sooner. Please advise.

## 2021-06-11 NOTE — Telephone Encounter (Addendum)
Spoke with Pt and Dr. Erin Hearing note given. Pt states that the hand numbness and blue finger happen in her home and when exposed to cold air. Pt thankful for the call and will continue to monitor.

## 2021-06-12 ENCOUNTER — Other Ambulatory Visit: Payer: Self-pay | Admitting: Cardiology

## 2021-06-15 NOTE — Progress Notes (Signed)
Carelink Summary Report / Loop Recorder 

## 2021-06-25 ENCOUNTER — Encounter: Payer: Self-pay | Admitting: Nurse Practitioner

## 2021-06-25 ENCOUNTER — Ambulatory Visit (INDEPENDENT_AMBULATORY_CARE_PROVIDER_SITE_OTHER): Payer: Medicare HMO | Admitting: Nurse Practitioner

## 2021-06-25 VITALS — BP 121/67 | HR 53 | Temp 97.5°F | Resp 20 | Ht 62.0 in | Wt 120.0 lb

## 2021-06-25 DIAGNOSIS — I10 Essential (primary) hypertension: Secondary | ICD-10-CM | POA: Diagnosis not present

## 2021-06-25 DIAGNOSIS — G459 Transient cerebral ischemic attack, unspecified: Secondary | ICD-10-CM | POA: Diagnosis not present

## 2021-06-25 DIAGNOSIS — E782 Mixed hyperlipidemia: Secondary | ICD-10-CM

## 2021-06-25 DIAGNOSIS — I480 Paroxysmal atrial fibrillation: Secondary | ICD-10-CM

## 2021-06-25 DIAGNOSIS — I7121 Aneurysm of the ascending aorta, without rupture: Secondary | ICD-10-CM | POA: Diagnosis not present

## 2021-06-25 DIAGNOSIS — F419 Anxiety disorder, unspecified: Secondary | ICD-10-CM

## 2021-06-25 DIAGNOSIS — M5030 Other cervical disc degeneration, unspecified cervical region: Secondary | ICD-10-CM

## 2021-06-25 DIAGNOSIS — E039 Hypothyroidism, unspecified: Secondary | ICD-10-CM

## 2021-06-25 DIAGNOSIS — I25718 Atherosclerosis of autologous vein coronary artery bypass graft(s) with other forms of angina pectoris: Secondary | ICD-10-CM

## 2021-06-25 MED ORDER — LEVOTHYROXINE SODIUM 75 MCG PO TABS: 75.0000 ug | ORAL_TABLET | Freq: Every day | ORAL | 1 refills | Status: DC

## 2021-06-25 MED ORDER — APIXABAN 2.5 MG PO TABS: 2.5000 mg | ORAL_TABLET | Freq: Two times a day (BID) | ORAL | 1 refills | Status: DC

## 2021-06-25 MED ORDER — METOPROLOL TARTRATE 100 MG PO TABS: 100.0000 mg | ORAL_TABLET | Freq: Two times a day (BID) | ORAL | 1 refills | Status: DC

## 2021-06-25 MED ORDER — ROSUVASTATIN CALCIUM 20 MG PO TABS: ORAL_TABLET | ORAL | 1 refills | Status: DC

## 2021-06-25 MED ORDER — ISOSORBIDE MONONITRATE ER 60 MG PO TB24: ORAL_TABLET | ORAL | 1 refills | Status: DC

## 2021-06-25 MED ORDER — HYDROCODONE-ACETAMINOPHEN 5-325 MG PO TABS: 1.0000 | ORAL_TABLET | Freq: Four times a day (QID) | ORAL | 0 refills | Status: DC | PRN

## 2021-06-25 MED ORDER — FUROSEMIDE 20 MG PO TABS: 20.0000 mg | ORAL_TABLET | Freq: Every day | ORAL | 1 refills | Status: DC

## 2021-06-25 MED ORDER — DILTIAZEM HCL 30 MG PO TABS: 30.0000 mg | ORAL_TABLET | Freq: Four times a day (QID) | ORAL | 1 refills | Status: DC | PRN

## 2021-06-25 MED ORDER — HYDROCODONE-ACETAMINOPHEN 5-325 MG PO TABS: 1.0000 | ORAL_TABLET | Freq: Four times a day (QID) | ORAL | 0 refills | Status: AC | PRN

## 2021-06-25 NOTE — Patient Instructions (Signed)
Managing Anxiety, Adult ?After being diagnosed with anxiety, you may be relieved to know why you have felt or behaved a certain way. You may also feel overwhelmed about the treatment ahead and what it will mean for your life. With care and support, you can manage this condition. ?How to manage lifestyle changes ?Managing stress and anxiety ?Stress is your body's reaction to life changes and events, both good and bad. Most stress will last just a few hours, but stress can be ongoing and can lead to more than just stress. Although stress can play a major role in anxiety, it is not the same as anxiety. Stress is usually caused by something external, such as a deadline, test, or competition. Stress normally passes after the triggering event has ended.  ?Anxiety is caused by something internal, such as imagining a terrible outcome or worrying that something will go wrong that will devastate you. Anxiety often does not go away even after the triggering event is over, and it can become long-term (chronic) worry. It is important to understand the differences between stress and anxiety and to manage your stress effectively so that it does not lead to an anxious response. ?Talk with your health care provider or a counselor to learn more about reducing anxiety and stress. He or she may suggest tension reduction techniques, such as: ?Music therapy. Spend time creating or listening to music that you enjoy and that inspires you. ?Mindfulness-based meditation. Practice being aware of your normal breaths while not trying to control your breathing. It can be done while sitting or walking. ?Centering prayer. This involves focusing on a word, phrase, or sacred image that means something to you and brings you peace. ?Deep breathing. To do this, expand your stomach and inhale slowly through your nose. Hold your breath for 3-5 seconds. Then exhale slowly, letting your stomach muscles relax. ?Self-talk. Learn to notice and identify  thought patterns that lead to anxiety reactions and change those patterns to thoughts that feel peaceful. ?Muscle relaxation. Taking time to tense muscles and then relax them. ?Choose a tension reduction technique that fits your lifestyle and personality. These techniques take time and practice. Set aside 5-15 minutes a day to do them. Therapists can offer counseling and training in these techniques. The training to help with anxiety may be covered by some insurance plans. ?Other things you can do to manage stress and anxiety include: ?Keeping a stress diary. This can help you learn what triggers your reaction and then learn ways to manage your response. ?Thinking about how you react to certain situations. You may not be able to control everything, but you can control your response. ?Making time for activities that help you relax and not feeling guilty about spending your time in this way. ?Doing visual imagery. This involves imagining or creating mental pictures to help you relax. ?Practicing yoga. Through yoga poses, you can lower tension and promote relaxation. ? ?Medicines ?Medicines can help ease symptoms. Medicines for anxiety include: ?Antidepressant medicines. These are usually prescribed for long-term daily control. ?Anti-anxiety medicines. These may be added in severe cases, especially when panic attacks occur. ?Medicines will be prescribed by a health care provider. When used together, medicines, psychotherapy, and tension reduction techniques may be the most effective treatment. ?Relationships ?Relationships can play a big part in helping you recover. Try to spend more time connecting with trusted friends and family members. ?Consider going to couples counseling if you have a partner, taking family education classes, or going to family   therapy. ?Therapy can help you and others better understand your condition. ?How to recognize changes in your anxiety ?Everyone responds differently to treatment for  anxiety. Recovery from anxiety happens when symptoms decrease and stop interfering with your daily activities at home or work. This may mean that you will start to: ?Have better concentration and focus. Worry will interfere less in your daily thinking. ?Sleep better. ?Be less irritable. ?Have more energy. ?Have improved memory. ?It is also important to recognize when your condition is getting worse. Contact your health care provider if your symptoms interfere with home or work and you feel like your condition is not improving. ?Follow these instructions at home: ?Activity ?Exercise. Adults should do the following: ?Exercise for at least 150 minutes each week. The exercise should increase your heart rate and make you sweat (moderate-intensity exercise). ?Strengthening exercises at least twice a week. ?Get the right amount and quality of sleep. Most adults need 7-9 hours of sleep each night. ?Lifestyle ? ?Eat a healthy diet that includes plenty of vegetables, fruits, whole grains, low-fat dairy products, and lean protein. ?Do not eat a lot of foods that are high in fats, added sugars, or salt (sodium). ?Make choices that simplify your life. ?Do not use any products that contain nicotine or tobacco. These products include cigarettes, chewing tobacco, and vaping devices, such as e-cigarettes. If you need help quitting, ask your health care provider. ?Avoid caffeine, alcohol, and certain over-the-counter cold medicines. These may make you feel worse. Ask your pharmacist which medicines to avoid. ?General instructions ?Take over-the-counter and prescription medicines only as told by your health care provider. ?Keep all follow-up visits. This is important. ?Where to find support ?You can get help and support from these sources: ?Self-help groups. ?Online and community organizations. ?A trusted spiritual leader. ?Couples counseling. ?Family education classes. ?Family therapy. ?Where to find more information ?You may find  that joining a support group helps you deal with your anxiety. The following sources can help you locate counselors or support groups near you: ?Mental Health America: www.mentalhealthamerica.net ?Anxiety and Depression Association of America (ADAA): www.adaa.org ?National Alliance on Mental Illness (NAMI): www.nami.org ?Contact a health care provider if: ?You have a hard time staying focused or finishing daily tasks. ?You spend many hours a day feeling worried about everyday life. ?You become exhausted by worry. ?You start to have headaches or frequently feel tense. ?You develop chronic nausea or diarrhea. ?Get help right away if: ?You have a racing heart and shortness of breath. ?You have thoughts of hurting yourself or others. ?If you ever feel like you may hurt yourself or others, or have thoughts about taking your own life, get help right away. Go to your nearest emergency department or: ?Call your local emergency services (911 in the [place] ?Call a suicide crisis helpline, such as the National Suicide Prevention Lifeline at 1-800-273-8255 or 988 in the [place] This is open 24 hours a day in the [place] ?Text the Crisis Text Line at 741741 (in the [place] ?Summary ?Taking steps to learn and use tension reduction techniques can help calm you and help prevent triggering an anxiety reaction. ?When used together, medicines, psychotherapy, and tension reduction techniques may be the most effective treatment. ?Family, friends, and partners can play a big part in supporting you. ?This information is not intended to replace advice given to you by your health care provider. Make sure you discuss any questions you have with your health care provider. ?Document Revised: 11/26/2020 Document Reviewed: 08/24/2020 ?Elsevier Patient   Education ? 2022 [employer] ? ?

## 2021-06-25 NOTE — Progress Notes (Signed)
Subjective:    Patient ID: Evelyn [name], Evelyn    DOB: Aug 01, 1940, 81 y.o.   MRN: [id number]   Chief Complaint: medical management of chronic issues     HPI:  Evelyn [name] is a 81 y.o. who identifies as a Evelyn who was assigned Evelyn at birth.   Social history: Lives with: by herself- her kids check on her daily Work history: retired   Scientist, forensic in today for follow up of the following chronic medical issues:  1. Essential hypertension No c/o chest pain, sob or headache. Does not check blood pressure at home. BP Readings from Last 3 Encounters:  05/26/21 (!) 148/65  03/27/21 124/63  02/04/21 (!) 144/70     2. TIA (transient ischemic attack) No residual effects  3. Aneurysm of ascending aorta without rupture Last angio was done 11/15/19. Was unchanged from previous scan in 2017.  4. Paroxysmal atrial fibrillation (HCC) No palpitations or heart racing since last saw cardiology. Is on Cardizem which works most of the time  5. Acquired hypothyroidism No problems that aware of. Lab Results  Component Value Date   TSH 1.540 03/27/2021     6. DDD (degenerative disc disease), cervical Pain assessment: Cause of pain- DDD Pain location- lower back Pain on scale of 1-10- 4/10 Frequency- almost daily What increases pain-nothing really What makes pain Better-rest Effects on ADL - none Any change in general medical condition-none  Current opioids rx- lortab BID # meds rx- 120 Effectiveness of current meds-helps Adverse reactions from pain meds-none Morphine equivalent- 20MME  Pill count performed-No Last drug screen - 03/27/21 ( high risk q43m, moderate risk q72m, low risk yearly ) Urine drug screen today- No Was the [facility] reviewed- yes  If yes were their any concerning findings? - no   Overdose risk: 1  Pain contract signed on:09/24/20   7. Mixed hyperlipidemia Dos not watch diet and does no dedicated exercise. She does stay very busy Lab Results   Component Value Date   CHOL 126 03/27/2021   HDL 64 03/27/2021   LDLCALC 48 03/27/2021   TRIG 65 03/27/2021   CHOLHDL 2.0 03/27/2021     8. Anxiety Has along history of anxiety. GAD 7 : Generalized Anxiety Score 06/25/2021 03/27/2021 12/24/2020 09/23/2020  Nervous, Anxious, on Edge 0 0 0 0  Control/stop worrying 0 0 0 0  Worry too much - different things 0 0 0 0  Trouble relaxing 0 0 0 0  Restless 0 0 0 0  Easily annoyed or irritable 0 0 0 0  Afraid - awful might happen 0 0 0 0  Total GAD 7 Score 0 0 0 0  Anxiety Difficulty Not difficult at all Not difficult at all Not difficult at all Not difficult at all       New complaints: None today  Allergies  Allergen Reactions   Sulfa Antibiotics Swelling   Outpatient Encounter Medications as of 06/25/2021  Medication Sig   apixaban (ELIQUIS) 2.5 MG TABS tablet Take 1 tablet (2.5 mg total) by mouth 2 (two) times daily.   cyclobenzaprine (FLEXERIL) 5 MG tablet Take 1 tablet (5 mg total) by mouth 3 (three) times daily as needed for muscle spasms.   diclofenac Sodium (VOLTAREN) 1 % GEL APPLY 4 G TOPICALLY 4 TIMES DAILY   diltiazem (CARDIZEM) 30 MG tablet Take 1 tablet (30 mg total) by mouth every 6 (six) hours as needed (for rapid heart rate).   furosemide (LASIX) 20 MG tablet Take 1  tablet (20 mg total) by mouth daily.   HYDROcodone-acetaminophen (NORCO) 5-325 MG tablet Take 1 tablet by mouth every 6 (six) hours as needed for moderate pain.   hydrocortisone 2.5 % cream APPLY TO AFFECTED AREA TWICE A DAY   isosorbide mononitrate (IMDUR) 60 MG 24 hr tablet TAKE 1 TABLET (60 MG TOTAL) DAILY BY MOUTH.   levothyroxine (SYNTHROID) 75 MCG tablet TAKE 1 TABLET BY MOUTH DAILY BEFORE BREAKFAST.   loratadine (CLARITIN) 10 MG tablet TAKE 1 TABLET BY MOUTH EVERY DAY   metoprolol tartrate (LOPRESSOR) 100 MG tablet Take 1 tablet (100 mg total) by mouth 2 (two) times daily.   nitroGLYCERIN (NITROSTAT) 0.4 MG SL tablet PLACE 1 TABLET UNDER THE TONGUE  EVERY 5 MINUTES AS NEEDED FOR CHEST PAIN.   rosuvastatin (CRESTOR) 20 MG tablet TAKE 1 TABLET BY MOUTH EVERY DAY IN THE EVENING   Zoledronic Acid (RECLAST IV) Inject into the vein. Once yearly   No facility-administered encounter medications on file as of 06/25/2021.    Past Surgical History:  Procedure Laterality Date   ABDOMINAL HYSTERECTOMY     BACK SURGERY  2009   CORONARY ANGIOPLASTY  2003   cutting balloon atherectomy in-stent stenosis of the LAD.   CORONARY ANGIOPLASTY WITH STENT PLACEMENT  2002   remote PCI & stenting LAD,   CORONARY ARTERY BYPASS GRAFT  2004   LIMA to LAD,vein graft to diagonal,vein graft to OM   FOOT SURGERY     LEFT HEART CATH AND CORS/GRAFTS ANGIOGRAPHY N/A 02/23/2017   Procedure: LEFT HEART CATH AND CORS/GRAFTS ANGIOGRAPHY;  Surgeon: Wellington Hampshire, MD;  Location: [facility];  Service: Cardiovascular;  Laterality: N/A;   LEFT HEART CATHETERIZATION WITH CORONARY ANGIOGRAM N/A 12/15/2011   Procedure: LEFT HEART CATHETERIZATION WITH CORONARY ANGIOGRAM;  Surgeon: Sanda Klein, MD;  Location: [facility];  Service: Cardiovascular;  Laterality: N/A;   LOOP RECORDER INSERTION N/A 06/14/2018   Procedure: LOOP RECORDER INSERTION;  Surgeon: Sanda Klein, MD;  Location: [facility];  Service: Cardiovascular;  Laterality: N/A;   NECK SURGERY     NM MYOCAR PERF WALL MOTION  12/01/2011   normal study-persistent exercise induced ECG changes raises the concern for ischemia (balanced ischemia).   NOSE SURGERY     PACEMAKER INSERTION     TONSILLECTOMY  1948   TUBAL LIGATION         Review of Systems  Constitutional:  Negative for diaphoresis.  Eyes:  Negative for pain.  Respiratory:  Negative for shortness of breath.   Cardiovascular:  Negative for chest pain, palpitations and leg swelling.  Gastrointestinal:  Negative for abdominal pain.  Endocrine: Negative for polydipsia.  Skin:  Negative for rash.  Neurological:  Negative for dizziness,  weakness and headaches.  Hematological:  Does not bruise/bleed easily.  All other systems reviewed and are negative.     Objective:   Physical Exam Vitals and nursing note reviewed.  Constitutional:      General: She is not in acute distress.    Appearance: Normal appearance. She is well-developed.  HENT:     Head: Normocephalic.     Right Ear: Tympanic membrane normal.     Left Ear: Tympanic membrane normal.     Nose: Nose normal.     Mouth/Throat:     Mouth: Mucous membranes are moist.  Eyes:     Pupils: Pupils are equal, round, and reactive to light.  Neck:     Vascular: No carotid bruit or  JVD.  Cardiovascular:     Rate and Rhythm: Normal rate and regular rhythm.     Heart sounds: Normal heart sounds.  Pulmonary:     Effort: Pulmonary effort is normal. No respiratory distress.     Breath sounds: Normal breath sounds. No wheezing or rales.  Chest:     Chest wall: No tenderness.  Abdominal:     General: Bowel sounds are normal. There is no distension or abdominal bruit.     Palpations: Abdomen is soft. There is no hepatomegaly, splenomegaly, mass or pulsatile mass.     Tenderness: There is no abdominal tenderness.  Musculoskeletal:        General: Normal range of motion.     Cervical back: Normal range of motion and neck supple.  Lymphadenopathy:     Cervical: No cervical adenopathy.  Skin:    General: Skin is warm and dry.  Neurological:     Mental Status: She is alert and oriented to person, place, and time.     Deep Tendon Reflexes: Reflexes are normal and symmetric.  Psychiatric:        Behavior: Behavior normal.        Thought Content: Thought content normal.        Judgment: Judgment normal.    BP 121/67    Pulse (!) 53    Temp (!) 97.5 F (36.4 C) (Temporal)    Resp 20    Ht 5\' 2"  (1.575 m)    Wt 120 lb (54.4 kg)    SpO2 95%    BMI 21.95 kg/m        Assessment & Plan:   ELEXIA LONDON comes in today with chief complaint of Medical Management of  Chronic Issues   Diagnosis and orders addressed:  1. Essential hypertension Low sodium diet - furosemide (LASIX) 20 MG tablet; Take 1 tablet (20 mg total) by mouth daily.  Dispense: 90 tablet; Refill: 1  2. TIA (transient ischemic attack)  3. Aneurysm of ascending aorta without rupture Will follow up next year  4. Paroxysmal atrial fibrillation (HCC) Avoid caffeine report any episodes of heart racing - diltiazem (CARDIZEM) 30 MG tablet; Take 1 tablet (30 mg total) by mouth every 6 (six) hours as needed (for rapid heart rate).  Dispense: 90 tablet; Refill: 1 - apixaban (ELIQUIS) 2.5 MG TABS tablet; Take 1 tablet (2.5 mg total) by mouth 2 (two) times daily.  Dispense: 180 tablet; Refill: 1 - metoprolol tartrate (LOPRESSOR) 100 MG tablet; Take 1 tablet (100 mg total) by mouth 2 (two) times daily.  Dispense: 180 tablet; Refill: 1  5. Acquired hypothyroidism Labs pending - levothyroxine (SYNTHROID) 75 MCG tablet; Take 1 tablet (75 mcg total) by mouth daily before breakfast.  Dispense: 90 tablet; Refill: 1  6. DDD (degenerative disc disease), cervical - HYDROcodone-acetaminophen (NORCO) 5-325 MG tablet; Take 1 tablet by mouth every 6 (six) hours as needed for moderate pain.  Dispense: 120 tablet; Refill: 0  7. Mixed hyperlipidemia Low fat diet - rosuvastatin (CRESTOR) 20 MG tablet; TAKE 1 TABLET BY MOUTH EVERY DAY IN THE EVENING  Dispense: 90 tablet; Refill: 1  8. Anxiety Stress management  9. 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  Dispense: 90 tablet; Refill: 1   Labs pending Health Maintenance reviewed Diet and exercise encouraged  Follow up plan: 3 months  Mary-Margaret Hassell Done, FNP

## 2021-07-06 ENCOUNTER — Ambulatory Visit (INDEPENDENT_AMBULATORY_CARE_PROVIDER_SITE_OTHER): Payer: Medicare HMO

## 2021-07-06 DIAGNOSIS — I480 Paroxysmal atrial fibrillation: Secondary | ICD-10-CM

## 2021-07-06 LAB — CUP PACEART REMOTE DEVICE CHECK
Date Time Interrogation Session: 20230219231304
Implantable Pulse Generator Implant Date: 20200129

## 2021-07-13 NOTE — Progress Notes (Signed)
Carelink Summary Report / Loop Recorder 

## 2021-07-22 ENCOUNTER — Other Ambulatory Visit: Payer: Self-pay | Admitting: Nurse Practitioner

## 2021-07-22 DIAGNOSIS — E782 Mixed hyperlipidemia: Secondary | ICD-10-CM

## 2021-07-31 ENCOUNTER — Other Ambulatory Visit: Payer: Self-pay | Admitting: Nurse Practitioner

## 2021-07-31 DIAGNOSIS — E782 Mixed hyperlipidemia: Secondary | ICD-10-CM

## 2021-08-10 ENCOUNTER — Ambulatory Visit (INDEPENDENT_AMBULATORY_CARE_PROVIDER_SITE_OTHER): Payer: Medicare HMO

## 2021-08-10 DIAGNOSIS — I480 Paroxysmal atrial fibrillation: Secondary | ICD-10-CM

## 2021-08-11 LAB — CUP PACEART REMOTE DEVICE CHECK
Date Time Interrogation Session: 20230326231500
Implantable Pulse Generator Implant Date: 20200129

## 2021-08-21 NOTE — Progress Notes (Signed)
Carelink Summary Report / Loop Recorder 

## 2021-09-13 LAB — CUP PACEART REMOTE DEVICE CHECK
Date Time Interrogation Session: 20230428231357
Implantable Pulse Generator Implant Date: 20200129

## 2021-09-14 ENCOUNTER — Ambulatory Visit (INDEPENDENT_AMBULATORY_CARE_PROVIDER_SITE_OTHER): Payer: Medicare HMO

## 2021-09-14 DIAGNOSIS — Z8673 Personal history of transient ischemic attack (TIA), and cerebral infarction without residual deficits: Secondary | ICD-10-CM

## 2021-09-22 ENCOUNTER — Ambulatory Visit: Payer: Medicare HMO | Admitting: Nurse Practitioner

## 2021-09-29 ENCOUNTER — Ambulatory Visit (INDEPENDENT_AMBULATORY_CARE_PROVIDER_SITE_OTHER): Payer: Medicare HMO | Admitting: Nurse Practitioner

## 2021-09-29 ENCOUNTER — Encounter: Payer: Self-pay | Admitting: Nurse Practitioner

## 2021-09-29 VITALS — BP 106/65 | HR 65 | Temp 97.9°F | Resp 20 | Ht 62.0 in | Wt 121.0 lb

## 2021-09-29 DIAGNOSIS — I25718 Atherosclerosis of autologous vein coronary artery bypass graft(s) with other forms of angina pectoris: Secondary | ICD-10-CM

## 2021-09-29 DIAGNOSIS — E039 Hypothyroidism, unspecified: Secondary | ICD-10-CM

## 2021-09-29 DIAGNOSIS — I10 Essential (primary) hypertension: Secondary | ICD-10-CM | POA: Diagnosis not present

## 2021-09-29 DIAGNOSIS — F419 Anxiety disorder, unspecified: Secondary | ICD-10-CM

## 2021-09-29 DIAGNOSIS — I480 Paroxysmal atrial fibrillation: Secondary | ICD-10-CM

## 2021-09-29 DIAGNOSIS — I351 Nonrheumatic aortic (valve) insufficiency: Secondary | ICD-10-CM | POA: Diagnosis not present

## 2021-09-29 DIAGNOSIS — E782 Mixed hyperlipidemia: Secondary | ICD-10-CM

## 2021-09-29 DIAGNOSIS — M5030 Other cervical disc degeneration, unspecified cervical region: Secondary | ICD-10-CM

## 2021-09-29 MED ORDER — METOPROLOL TARTRATE 100 MG PO TABS: 100.0000 mg | ORAL_TABLET | Freq: Two times a day (BID) | ORAL | 1 refills | Status: DC

## 2021-09-29 MED ORDER — ROSUVASTATIN CALCIUM 10 MG PO TABS: 10.0000 mg | ORAL_TABLET | Freq: Every day | ORAL | 1 refills | Status: DC

## 2021-09-29 MED ORDER — HYDROCODONE-ACETAMINOPHEN 5-325 MG PO TABS: 1.0000 | ORAL_TABLET | Freq: Four times a day (QID) | ORAL | 0 refills | Status: AC | PRN

## 2021-09-29 MED ORDER — FUROSEMIDE 20 MG PO TABS: 20.0000 mg | ORAL_TABLET | Freq: Every day | ORAL | 1 refills | Status: DC

## 2021-09-29 MED ORDER — HYDROCODONE-ACETAMINOPHEN 5-325 MG PO TABS: 1.0000 | ORAL_TABLET | Freq: Four times a day (QID) | ORAL | 0 refills | Status: DC | PRN

## 2021-09-29 MED ORDER — DILTIAZEM HCL 30 MG PO TABS: 30.0000 mg | ORAL_TABLET | Freq: Four times a day (QID) | ORAL | 1 refills | Status: DC | PRN

## 2021-09-29 MED ORDER — APIXABAN 2.5 MG PO TABS: 2.5000 mg | ORAL_TABLET | Freq: Two times a day (BID) | ORAL | 1 refills | Status: DC

## 2021-09-29 MED ORDER — LEVOTHYROXINE SODIUM 75 MCG PO TABS: 75.0000 ug | ORAL_TABLET | Freq: Every day | ORAL | 1 refills | Status: DC

## 2021-09-29 MED ORDER — ISOSORBIDE MONONITRATE ER 60 MG PO TB24: ORAL_TABLET | ORAL | 1 refills | Status: DC

## 2021-09-29 NOTE — Patient Instructions (Signed)

## 2021-09-29 NOTE — Progress Notes (Signed)
Carelink Summary Report / Loop Recorder 

## 2021-09-29 NOTE — Progress Notes (Signed)
? ?Subjective:  ? ? Patient ID: Evelyn MARLOWE, female    DOB: February 24, 1941, 81 y.o.   MRN: [id number] ? ? ?Chief Complaint: Medical Management of Chronic Issues ?  ? ?HPI: ? ?Evelyn [name] is a 81 y.o. who identifies as a female who was assigned female at birth.  ? ?Social history: ?Lives with: by herself ?Work history: retired ? ? ?Comes in today for follow up of the following chronic medical issues: ? ?1. Essential hypertension ?No c/o chest pain, sob or headache. Does not check blood pressure at home. ?BP Readings from Last 3 Encounters:  ?09/29/21 106/65  ?06/25/21 121/67  ?05/26/21 (!) 148/65  ? ? ? ?2. Paroxysmal atrial fibrillation (HCC) ?3. 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 Nonrheumatic aortic (valve) insufficiency ?Last saw cardiology  on 01/2021. She has clinical support monthly from cardiology due to atrial fib. No chang etp plan of care in several years. ? ?5. Acquired hypothyroidism ?No problems that aware of. ?Lab Results  ?Component Value Date  ? TSH 1.540 03/27/2021  ? ? ? ?6. Mixed hyperlipidemia ?Does not watch diet and does very little exercise. ?Lab Results  ?Component Value Date  ? CHOL 126 03/27/2021  ? HDL 64 03/27/2021  ? LDLCALC 48 03/27/2021  ? TRIG 65 03/27/2021  ? CHOLHDL 2.0 03/27/2021  ? ? ? ?7. Anxiety ?Is on no anxiety meds. ? ?  09/29/2021  ? 12:06 PM 06/25/2021  ?  2:55 PM 03/27/2021  ?  2:21 PM 12/24/2020  ? 12:01 PM  ?GAD 7 : Generalized Anxiety Score  ?Nervous, Anxious, on Edge 0 0 0 0  ?Control/stop worrying 0 0 0 0  ?Worry too much - different things 0 0 0 0  ?Trouble relaxing 0 0 0 0  ?Restless 0 0 0 0  ?Easily annoyed or irritable 0 0 0 0  ?Afraid - awful might happen 0 0 0 0  ?Total GAD 7 Score 0 0 0 0  ?Anxiety Difficulty Not difficult at all Not difficult at all Not difficult at all Not difficult at all  ? ? ? ? ?8. DDD (degenerative disc disease), cervical ?Pain assessment: ?Cause of pain- back pain ?Pain location-  back pain ?Pain on scale of 1-10- 7/10 ?Frequency- daily ?What increases pain-activity ?What makes pain Better-rest helps ?Effects on ADL - none ?Any change in general medical condition-none ? ?Current opioids rx- norco 5/325 4x a day ?# meds rx- 120 ?Effectiveness of current meds-helps ?Adverse reactions from pain meds-none ?Morphine equivalent- 20 MME ? ?Pill count performed-No ?Last drug screen - 03/27/21 ?( high risk q24m, moderate risk q41m, low risk yearly ) ?Urine drug screen today- Yes ?Was the [facility] reviewed- yes ? If yes were their any concerning findings? - no ? ? ?Overdose risk: 1 ? ?Pain contract signed on:09/24/20- urine drug screen today ? ? ? ?New complaints: ?None today ? ?Allergies  ?Allergen Reactions  ? Sulfa Antibiotics Swelling  ? ?Outpatient Encounter Medications as of 09/29/2021  ?Medication Sig  ? apixaban (ELIQUIS) 2.5 MG TABS tablet Take 1 tablet (2.5 mg total) by mouth 2 (two) times daily.  ? cyclobenzaprine (FLEXERIL) 5 MG tablet Take 1 tablet (5 mg total) by mouth 3 (three) times daily as needed for muscle spasms.  ? diltiazem (CARDIZEM) 30 MG tablet Take 1 tablet (30 mg total) by mouth every 6 (six) hours as needed (for rapid heart rate).  ? furosemide (LASIX) 20 MG tablet Take 1  tablet (20 mg total) by mouth daily.  ? hydrocortisone 2.5 % cream APPLY TO AFFECTED AREA TWICE A DAY  ? isosorbide mononitrate (IMDUR) 60 MG 24 hr tablet TAKE 1 TABLET (60 MG TOTAL) DAILY BY MOUTH.  ? levothyroxine (SYNTHROID) 75 MCG tablet Take 1 tablet (75 mcg total) by mouth daily before breakfast.  ? loratadine (CLARITIN) 10 MG tablet TAKE 1 TABLET BY MOUTH EVERY DAY  ? metoprolol tartrate (LOPRESSOR) 100 MG tablet Take 1 tablet (100 mg total) by mouth 2 (two) times daily.  ? nitroGLYCERIN (NITROSTAT) 0.4 MG SL tablet PLACE 1 TABLET UNDER THE TONGUE EVERY 5 MINUTES AS NEEDED FOR CHEST PAIN.  ? rosuvastatin (CRESTOR) 10 MG tablet TAKE 1 TABLET BY MOUTH EVERY DAY IN THE EVENING  ? rosuvastatin (CRESTOR) 20 MG  tablet TAKE 1 TABLET BY MOUTH EVERY DAY IN THE EVENING  ? Zoledronic Acid (RECLAST IV) Inject into the vein. Once yearly  ? [DISCONTINUED] diclofenac Sodium (VOLTAREN) 1 % GEL APPLY 4 G TOPICALLY 4 TIMES DAILY  ? ?No facility-administered encounter medications on file as of 09/29/2021.  ? ? ?Past Surgical History:  ?Procedure Laterality Date  ? ABDOMINAL HYSTERECTOMY    ? BACK SURGERY  2009  ? CORONARY ANGIOPLASTY  2003  ? cutting balloon atherectomy in-stent stenosis of the LAD.  ? CORONARY ANGIOPLASTY WITH STENT PLACEMENT  2002  ? remote PCI & stenting LAD,  ? CORONARY ARTERY BYPASS GRAFT  2004  ? LIMA to LAD,vein graft to diagonal,vein graft to OM  ? FOOT SURGERY    ? LEFT HEART CATH AND CORS/GRAFTS ANGIOGRAPHY N/A 02/23/2017  ? Procedure: LEFT HEART CATH AND CORS/GRAFTS ANGIOGRAPHY;  Surgeon: Iran OuchArida, Muhammad A, MD;  Location: [facility];  Service: Cardiovascular;  Laterality: N/A;  ? LEFT HEART CATHETERIZATION WITH CORONARY ANGIOGRAM N/A 12/15/2011  ? Procedure: LEFT HEART CATHETERIZATION WITH CORONARY ANGIOGRAM;  Surgeon: Thurmon FairMihai Croitoru, MD;  Location: [facility];  Service: Cardiovascular;  Laterality: N/A;  ? LOOP RECORDER INSERTION N/A 06/14/2018  ? Procedure: LOOP RECORDER INSERTION;  Surgeon: Thurmon Fairroitoru, Mihai, MD;  Location: [facility];  Service: Cardiovascular;  Laterality: N/A;  ? NECK SURGERY    ? NM MYOCAR PERF WALL MOTION  12/01/2011  ? normal study-persistent exercise induced ECG changes raises the concern for ischemia (balanced ischemia).  ? NOSE SURGERY    ? PACEMAKER INSERTION    ? TONSILLECTOMY  1948  ? TUBAL LIGATION    ? ? ?Family History  ?Problem Relation Age of Onset  ? Heart attack Mother   ? Heart failure Father   ? Hypertension Father   ? Heart disease Father   ? Asthma Maternal Grandmother   ? Heart disease Sister   ? Stroke Brother   ? Voice disorder Daughter   ?     vocal dystonia  ? Stroke Daughter   ? Stroke Son   ? Cancer Sister [id number]  ? ? ? ? ? ?Review of Systems   ?Constitutional:  Negative for diaphoresis.  ?Eyes:  Negative for pain.  ?Respiratory:  Negative for shortness of breath.   ?Cardiovascular:  Negative for chest pain, palpitations and leg swelling.  ?Gastrointestinal:  Negative for abdominal pain.  ?Endocrine: Negative for polydipsia.  ?Skin:  Negative for rash.  ?Neurological:  Negative for dizziness, weakness and headaches.  ?Hematological:  Does not bruise/bleed easily.  ?All other systems reviewed and are negative. ? ?   ?Objective:  ? Physical Exam ?Vitals and nursing note reviewed.  ?  Constitutional:   ?   General: She is not in acute distress. ?   Appearance: Normal appearance. She is well-developed.  ?HENT:  ?   Head: Normocephalic.  ?   Right Ear: Tympanic membrane normal.  ?   Left Ear: Tympanic membrane normal.  ?   Nose: Nose normal.  ?   Mouth/Throat:  ?   Mouth: Mucous membranes are moist.  ?Eyes:  ?   Pupils: Pupils are equal, round, and reactive to light.  ?Neck:  ?   Vascular: No carotid bruit or JVD.  ?Cardiovascular:  ?   Rate and Rhythm: Normal rate and regular rhythm.  ?   Heart sounds: Normal heart sounds.  ?Pulmonary:  ?   Effort: Pulmonary effort is normal. No respiratory distress.  ?   Breath sounds: Normal breath sounds. No wheezing or rales.  ?Chest:  ?   Chest wall: No tenderness.  ?Abdominal:  ?   General: Bowel sounds are normal. There is no distension or abdominal bruit.  ?   Palpations: Abdomen is soft. There is no hepatomegaly, splenomegaly, mass or pulsatile mass.  ?   Tenderness: There is no abdominal tenderness.  ?Musculoskeletal:     ?   General: Normal range of motion.  ?   Cervical back: Normal range of motion and neck supple.  ?Lymphadenopathy:  ?   Cervical: No cervical adenopathy.  ?Skin: ?   General: Skin is warm and dry.  ?Neurological:  ?   Mental Status: She is alert and oriented to person, place, and time.  ?   Deep Tendon Reflexes: Reflexes are normal and symmetric.  ?Psychiatric:     ?   Behavior: Behavior normal.      ?   Thought Content: Thought content normal.     ?   Judgment: Judgment normal.  ? ?BP 106/65   Pulse 65   Temp 97.9 ?F (36.6 ?C) (Temporal)   Resp 20   Ht 5\' 2"  (1.575 m)   Wt 121 lb (54.9 kg)   SpO2 96%

## 2021-10-03 LAB — TOXASSURE SELECT 13 (MW), URINE

## 2021-10-14 ENCOUNTER — Emergency Department (HOSPITAL_COMMUNITY): Payer: Medicare HMO

## 2021-10-14 ENCOUNTER — Encounter (HOSPITAL_COMMUNITY): Payer: Self-pay

## 2021-10-14 ENCOUNTER — Emergency Department (HOSPITAL_COMMUNITY)
Admission: EM | Admit: 2021-10-14 | Discharge: 2021-10-14 | Disposition: A | Discharge status: Home / Self Care | Payer: Medicare HMO | Attending: Emergency Medicine | Admitting: Emergency Medicine

## 2021-10-14 ENCOUNTER — Other Ambulatory Visit: Payer: Self-pay

## 2021-10-14 DIAGNOSIS — Z7901 Long term (current) use of anticoagulants: Secondary | ICD-10-CM | POA: Diagnosis not present

## 2021-10-14 DIAGNOSIS — E039 Hypothyroidism, unspecified: Secondary | ICD-10-CM | POA: Insufficient documentation

## 2021-10-14 DIAGNOSIS — X500XXA Overexertion from strenuous movement or load, initial encounter: Secondary | ICD-10-CM | POA: Insufficient documentation

## 2021-10-14 DIAGNOSIS — I2510 Atherosclerotic heart disease of native coronary artery without angina pectoris: Secondary | ICD-10-CM | POA: Diagnosis not present

## 2021-10-14 DIAGNOSIS — M545 Low back pain, unspecified: Secondary | ICD-10-CM

## 2021-10-14 DIAGNOSIS — Z79899 Other long term (current) drug therapy: Secondary | ICD-10-CM | POA: Diagnosis not present

## 2021-10-14 DIAGNOSIS — S32010A Wedge compression fracture of first lumbar vertebra, initial encounter for closed fracture: Secondary | ICD-10-CM | POA: Insufficient documentation

## 2021-10-14 DIAGNOSIS — I10 Essential (primary) hypertension: Secondary | ICD-10-CM | POA: Diagnosis not present

## 2021-10-14 DIAGNOSIS — S3992XA Unspecified injury of lower back, initial encounter: Secondary | ICD-10-CM | POA: Diagnosis present

## 2021-10-14 MED ORDER — OXYCODONE-ACETAMINOPHEN 5-325 MG PO TABS
1.0000 | ORAL_TABLET | Freq: Once | ORAL | Status: AC
  Administered 2021-10-14: 1 via ORAL
  Filled 2021-10-14: qty 1

## 2021-10-14 MED ORDER — LIDOCAINE 5 % EX PTCH
1.0000 | MEDICATED_PATCH | CUTANEOUS | Status: DC
  Administered 2021-10-14: 1 via TRANSDERMAL
  Filled 2021-10-14: qty 1

## 2021-10-14 MED ORDER — KETOROLAC TROMETHAMINE 60 MG/2ML IM SOLN
30.0000 mg | Freq: Once | INTRAMUSCULAR | Status: AC
  Administered 2021-10-14: 30 mg via INTRAMUSCULAR
  Filled 2021-10-14: qty 2

## 2021-10-14 MED ORDER — METHOCARBAMOL 500 MG PO TABS: 500.0000 mg | ORAL_TABLET | Freq: Two times a day (BID) | ORAL | 0 refills | Status: DC | PRN

## 2021-10-14 MED ORDER — METHOCARBAMOL 500 MG PO TABS
500.0000 mg | ORAL_TABLET | Freq: Once | ORAL | Status: AC
  Administered 2021-10-14: 500 mg via ORAL
  Filled 2021-10-14: qty 1

## 2021-10-14 NOTE — ED Notes (Signed)
Pt gone to xray

## 2021-10-14 NOTE — ED Provider Notes (Signed)
[facility] EMERGENCY DEPARTMENT Provider Note   CSN: [id number] Arrival date & time: 10/14/21  4098     History  Chief Complaint  Patient presents with   Back Pain    Evelyn [name] is a 81 y.o. female.  HPI Patient presents for low back pain.  Onset was this morning.  At the time, she was moving a generator.  She felt a pop in her lower back and is since had pain.  Currently it is 8/10 in severity.  It does not radiate.  Pain is worsened with movement.  She denies any lower extremity numbness, weakness, or urinary incontinence.  Since her injury, she has not taken anything for analgesia.  She does take Norco at home for chronic pain.  Her medical history includes CAD, aortic valve insufficiency, HTN, TIA, aortic aneurysm, paroxysmal atrial fibrillation, hypothyroidism, cervical radiculopathy, old compression fracture at T11, eczema, anxiety, HLD.    Home Medications Prior to Admission medications   Medication Sig Start Date End Date Taking? Authorizing Provider  methocarbamol (ROBAXIN) 500 MG tablet Take 1 tablet (500 mg total) by mouth every 12 (twelve) hours as needed for muscle spasms. 10/14/21  Yes Gloris Manchester, MD  apixaban (ELIQUIS) 2.5 MG TABS tablet Take 1 tablet (2.5 mg total) by mouth 2 (two) times daily. 09/29/21   Daphine Deutscher Mary-Margaret, FNP  cyclobenzaprine (FLEXERIL) 5 MG tablet Take 1 tablet (5 mg total) by mouth 3 (three) times daily as needed for muscle spasms. 12/25/20   Daphine Deutscher, Mary-Margaret, FNP  diltiazem (CARDIZEM) 30 MG tablet Take 1 tablet (30 mg total) by mouth every 6 (six) hours as needed (for rapid heart rate). 09/29/21   Daphine Deutscher, Mary-Margaret, FNP  furosemide (LASIX) 20 MG tablet Take 1 tablet (20 mg total) by mouth daily. 09/29/21   Daphine Deutscher Mary-Margaret, FNP  HYDROcodone-acetaminophen (NORCO) 5-325 MG tablet Take 1 tablet by mouth every 6 (six) hours as needed for moderate pain. 11/28/21 12/28/21  Bennie Pierini, FNP  HYDROcodone-acetaminophen (NORCO)  5-325 MG tablet Take 1 tablet by mouth every 6 (six) hours as needed for moderate pain. 10/29/21 11/28/21  Bennie Pierini, FNP  HYDROcodone-acetaminophen (NORCO) 5-325 MG tablet Take 1 tablet by mouth every 6 (six) hours as needed for moderate pain. 09/29/21 10/29/21  Bennie Pierini, FNP  hydrocortisone 2.5 % cream APPLY TO AFFECTED AREA TWICE A DAY 11/13/20   Daphine Deutscher, Mary-Margaret, FNP  isosorbide mononitrate (IMDUR) 60 MG 24 hr tablet TAKE 1 TABLET (60 MG TOTAL) DAILY BY MOUTH. 09/29/21   Daphine Deutscher Mary-Margaret, FNP  levothyroxine (SYNTHROID) 75 MCG tablet Take 1 tablet (75 mcg total) by mouth daily before breakfast. 09/29/21   Bennie Pierini, FNP  loratadine (CLARITIN) 10 MG tablet TAKE 1 TABLET BY MOUTH EVERY DAY 11/13/20   Daphine Deutscher, Mary-Margaret, FNP  metoprolol tartrate (LOPRESSOR) 100 MG tablet Take 1 tablet (100 mg total) by mouth 2 (two) times daily. 09/29/21   Daphine Deutscher, Mary-Margaret, FNP  nitroGLYCERIN (NITROSTAT) 0.4 MG SL tablet PLACE 1 TABLET UNDER THE TONGUE EVERY 5 MINUTES AS NEEDED FOR CHEST PAIN. 06/12/21   Camnitz, Andree Coss, MD  rosuvastatin (CRESTOR) 10 MG tablet Take 1 tablet (10 mg total) by mouth daily. 09/29/21   Daphine Deutscher Mary-Margaret, FNP  Zoledronic Acid (RECLAST IV) Inject into the vein. Once yearly    [provider]      Allergies    Sulfa antibiotics    Review of Systems   Review of Systems  Musculoskeletal:  Positive for back pain.  All other systems reviewed and  are negative.  Physical Exam Updated Vital Signs BP (!) 150/63 (BP Location: Right Arm)   Pulse 60   Temp 97.7 F (36.5 C) (Oral)   Resp 18   Ht 5\' 2"  (1.575 m)   Wt 54.4 kg   SpO2 93%   BMI 21.95 kg/m  Physical Exam Vitals and nursing note reviewed.  Constitutional:      General: She is not in acute distress.    Appearance: Normal appearance. She is well-developed and normal weight. She is not ill-appearing, toxic-appearing or diaphoretic.  HENT:     Head: Normocephalic  and atraumatic.     Right Ear: External ear normal.     Left Ear: External ear normal.     Nose: Nose normal.     Mouth/Throat:     Mouth: Mucous membranes are moist.     Pharynx: Oropharynx is clear.  Eyes:     Extraocular Movements: Extraocular movements intact.     Conjunctiva/sclera: Conjunctivae normal.  Cardiovascular:     Rate and Rhythm: Normal rate and regular rhythm.     Heart sounds: No murmur heard. Pulmonary:     Effort: Pulmonary effort is normal. No respiratory distress.     Breath sounds: Normal breath sounds. No wheezing or rales.  Abdominal:     Palpations: Abdomen is soft.     Tenderness: There is no abdominal tenderness.  Musculoskeletal:        General: Tenderness (L5-S1) present. No swelling or deformity. Normal range of motion.     Cervical back: Normal range of motion and neck supple. No rigidity.     Right lower leg: No edema.     Left lower leg: No edema.  Skin:    General: Skin is warm and dry.     Capillary Refill: Capillary refill takes less than 2 seconds.     Coloration: Skin is not jaundiced or pale.  Neurological:     General: No focal deficit present.     Mental Status: She is alert and oriented to person, place, and time.     Cranial Nerves: No cranial nerve deficit.     Sensory: No sensory deficit.     Motor: No weakness.     Coordination: Coordination normal.  Psychiatric:        Mood and Affect: Mood normal.        Behavior: Behavior normal.        Thought Content: Thought content normal.        Judgment: Judgment normal.    ED Results / Procedures / Treatments   Labs (all labs ordered are listed, but only abnormal results are displayed) Labs Reviewed - No data to display  EKG None  Radiology DG Lumbar Spine Complete  Result Date: 10/14/2021 CLINICAL DATA:  Back pain. EXAM: LUMBAR SPINE - COMPLETE 4+ VIEW COMPARISON:  Prior CT angio chest from 2021. FINDINGS: There is a compression deformity of L1 possibly an acute compression  fracture. The other lumbar vertebral bodies intact. Chronic appearing compression deformity of T11. Advanced vascular calcifications without definite aneurysm. The visualized bony pelvis is grossly intact. IMPRESSION: 1. Probable acute compression fracture of L1. 2. Chronic appearing compression deformity of T11. 3. Advanced vascular calcifications. Electronically Signed   By: Rudie MeyerP.  Gallerani M.D.   On: 10/14/2021 09:54   CT Lumbar Spine Wo Contrast  Result Date: 10/14/2021 CLINICAL DATA:  Lower back pain EXAM: CT LUMBAR SPINE WITHOUT CONTRAST TECHNIQUE: Multidetector CT imaging of the lumbar spine was performed  without intravenous contrast administration. Multiplanar CT image reconstructions were also generated. RADIATION DOSE REDUCTION: This exam was performed according to the departmental dose-optimization program which includes automated exposure control, adjustment of the mA and/or kV according to patient size and/or use of iterative reconstruction technique. COMPARISON:  Same-day lumbar spine radiographs FINDINGS: Segmentation: There is partial lumbarization of the S1 vertebral body with a rudimentary S1-S2 disc space. For the purposes of this report, the lowest fully formed disc space is designated L5-S1 Alignment: There is trace retrolisthesis of L2 on L3 and L3 on L4. There is no evidence of traumatic malalignment. Vertebrae: The bones are diffusely demineralized. There is an acute mild compression fracture of the L1 vertebral body with up to approximately 10% loss of vertebral body height. There is no extension to the posterior elements or bony retropulsion. The other vertebral body heights are preserved, without other evidence of acute injury. There are prominent Schmorl's nodes at L2-L3 and L4-L5. There is no suspicious osseous lesion. Paraspinal and other soft tissues: There is extensive calcified atherosclerotic plaque throughout the nonaneurysmal abdominal aorta. The paraspinal soft tissues are  unremarkable. Disc levels: T12-L1: No significant spinal canal or neural foraminal stenosis L1-L2: There is a mild disc bulge without significant spinal canal or neural foraminal stenosis. L2-L3: There is a mild disc bulge and mild facet arthropathy resulting in mild left and no significant right neural foraminal stenosis and mild spinal canal stenosis L3-L4: There is a mild disc bulge and bilateral facet arthropathy with ligamentum flavum thickening resulting in mild-to-moderate spinal canal stenosis without significant neural foraminal stenosis. L4-L5: There is mild disc bulge and right worse than left facet arthropathy with ligamentum flavum thickening resulting in mild-to-moderate spinal canal stenosis without significant neural foraminal stenosis L5-S1: There is a mild disc bulge and right worse than left facet arthropathy without significant spinal canal or neural foraminal stenosis. IMPRESSION: 1. Acute mild compression fracture of the L1 vertebral body without extension to the posterior elements or bony retropulsion. 2. Multilevel degenerative changes as detailed above. 3.  Aortic Atherosclerosis (ICD10-I70.0). Electronically Signed   By: Lesia Hausen M.D.   On: 10/14/2021 12:24    Procedures Procedures    Medications Ordered in ED Medications  lidocaine (LIDODERM) 5 % 1 patch (1 patch Transdermal Patch Applied 10/14/21 0914)  oxyCODONE-acetaminophen (PERCOCET/ROXICET) 5-325 MG per tablet 1 tablet (1 tablet Oral Given 10/14/21 0914)  methocarbamol (ROBAXIN) tablet 500 mg (500 mg Oral Given 10/14/21 0914)  ketorolac (TORADOL) injection 30 mg (30 mg Intramuscular Given 10/14/21 5732)    ED Course/ Medical Decision Making/ A&P                           Medical Decision Making Amount and/or Complexity of Data Reviewed Radiology: ordered.  Risk Prescription drug management.   This patient presents to the ED for concern of low back pain, this involves an extensive number of treatment options,  and is a complaint that carries with it a high risk of complications and morbidity.  The differential diagnosis includes fracture, disc herniation, cauda equina syndrome   Co morbidities that complicate the patient evaluation  CAD, aortic valve insufficiency, HTN, TIA, aortic aneurysm, paroxysmal atrial fibrillation, hypothyroidism, cervical radiculopathy, old compression fracture at T11, eczema, anxiety, HLD   Additional history obtained:  Additional history obtained from N/A External records from outside source obtained and reviewed including EMR  Imaging Studies ordered:  I ordered imaging studies including lumbar spine x-ray,  lumbar spine CT I independently visualized and interpreted imaging which showed acute L1 compression fracture I agree with the radiologist interpretation   Cardiac Monitoring: / EKG:  The patient was maintained on a cardiac monitor.  I personally viewed and interpreted the cardiac monitored which showed an underlying rhythm of: Sinus rhythm  Problem List / ED Course / Critical interventions / Medication management  Patient is 81 year old female who presents for cute onset of low back pain.  At the time of onset, she was moving a heavy generator and she felt a pop in her lower back.  She has since had pain in this area that is worsened with movements, particularly twisting motions.  On arrival in the ED, vital signs are notable for moderate hypertension.  She is well-appearing on exam.  She has no lower extremity neurologic deficits.  Straight leg raise test is negative bilaterally.  She does have tenderness in the region of S5-L1.  Patient was given multimodal pain control with Toradol, Robaxin, lidocaine patch, and Percocet.  X-ray imaging of lumbar spine was ordered.  On x-ray imaging, there is concern of a L1 compression fracture.  CT scan of lumbar spine was ordered to further evaluate.  On CT scan, mild compression fracture of L1 was further identified.   Patient was informed of imaging results.  She states that her pain has improved but continues to have pain with twisting motions.  TLSO brace was ordered.  At time of signout, patient awaiting placement of TLSO brace.  Care of patient was signed out to oncoming ED provider I ordered medication including lidocaine patch, Percocet, Robaxin, and Toradol for analgesia Reevaluation of the patient after these medicines showed that the patient improved I have reviewed the patients home medicines and have made adjustments as needed   Social Determinants of Health:  Lives independently         Final Clinical Impression(s) / ED Diagnoses Final diagnoses:  Acute midline low back pain without sciatica  Closed compression fracture of body of L1 vertebra (HCC)    Rx / DC Orders ED Discharge Orders          Ordered    methocarbamol (ROBAXIN) 500 MG tablet  Every 12 hours PRN        10/14/21 1534              Gloris Manchester, MD 10/14/21 1658

## 2021-10-14 NOTE — ED Notes (Signed)
Evelyn [name] arrives with TLSO Brace, instruction given on how to apply brace correctly. Patient verbalized understanding. Patient made aware brace is to worn when OOB and can be removed when showering.

## 2021-10-14 NOTE — ED Notes (Signed)
Spoke with Day Jomarie Longs, ortho tech, Notified her that patient has a compression fracture of T11 and new order placed for TLSO Brace.

## 2021-10-14 NOTE — Progress Notes (Signed)
Orthopedic Tech Progress Note Patient Details:  Evelyn [name] 03-13-1941 [id number]  Called in order to [facility] for a TLSO BRACE  Patient ID: Evelyn [name], female   DOB: [birth year], 81 y.o.   MRN: [id number]  [name] 10/14/2021, 2:19 PM

## 2021-10-14 NOTE — ED Triage Notes (Signed)
Patient via EMS with complaints of lower back pain that started after trying top lift a generator this morning.

## 2021-10-19 ENCOUNTER — Ambulatory Visit (INDEPENDENT_AMBULATORY_CARE_PROVIDER_SITE_OTHER): Payer: Medicare HMO

## 2021-10-19 ENCOUNTER — Ambulatory Visit (INDEPENDENT_AMBULATORY_CARE_PROVIDER_SITE_OTHER): Payer: Medicare HMO | Admitting: Nurse Practitioner

## 2021-10-19 ENCOUNTER — Telehealth: Payer: Self-pay | Admitting: Nurse Practitioner

## 2021-10-19 ENCOUNTER — Encounter: Payer: Self-pay | Admitting: Nurse Practitioner

## 2021-10-19 DIAGNOSIS — S32019A Unspecified fracture of first lumbar vertebra, initial encounter for closed fracture: Secondary | ICD-10-CM | POA: Diagnosis not present

## 2021-10-19 DIAGNOSIS — I480 Paroxysmal atrial fibrillation: Secondary | ICD-10-CM | POA: Diagnosis not present

## 2021-10-19 LAB — CUP PACEART REMOTE DEVICE CHECK
Date Time Interrogation Session: 20230531232531
Implantable Pulse Generator Implant Date: 20200129

## 2021-10-19 NOTE — Patient Instructions (Signed)
Acute Back Pain, Adult Acute back pain is sudden and usually short-lived. It is often caused by an injury to the muscles and tissues in the back. The injury may result from: A muscle, tendon, or ligament getting overstretched or torn. Ligaments are tissues that connect bones to each other. Lifting something improperly can cause a back strain. Wear and tear (degeneration) of the spinal disks. Spinal disks are circular tissue that provide cushioning between the bones of the spine (vertebrae). Twisting motions, such as while playing sports or doing yard work. A hit to the back. Arthritis. You may have a physical exam, lab tests, and imaging tests to find the cause of your pain. Acute back pain usually goes away with rest and home care. Follow these instructions at home: Managing pain, stiffness, and swelling Take over-the-counter and prescription medicines only as told by your health care provider. Treatment may include medicines for pain and inflammation that are taken by mouth or applied to the skin, or muscle relaxants. Your health care provider may recommend applying ice during the first 24-48 hours after your pain starts. To do this: Put ice in a plastic bag. Place a towel between your skin and the bag. Leave the ice on for 20 minutes, 2-3 times a day. Remove the ice if your skin turns bright red. This is very important. If you cannot feel pain, heat, or cold, you have a greater risk of damage to the area. If directed, apply heat to the affected area as often as told by your health care provider. Use the heat source that your health care provider recommends, such as a moist heat pack or a heating pad. Place a towel between your skin and the heat source. Leave the heat on for 20-30 minutes. Remove the heat if your skin turns bright red. This is especially important if you are unable to feel pain, heat, or cold. You have a greater risk of getting burned. Activity  Do not stay in bed. Staying in  bed for more than 1-2 days can delay your recovery. Sit up and stand up straight. Avoid leaning forward when you sit or hunching over when you stand. If you work at a desk, sit close to it so you do not need to lean over. Keep your chin tucked in. Keep your neck drawn back, and keep your elbows bent at a 90-degree angle (right angle). Sit high and close to the steering wheel when you drive. Add lower back (lumbar) support to your car seat, if needed. Take short walks on even surfaces as soon as you are able. Try to increase the length of time you walk each day. Do not sit, drive, or stand in one place for more than 30 minutes at a time. Sitting or standing for long periods of time can put stress on your back. Do not drive or use heavy machinery while taking prescription pain medicine. Use proper lifting techniques. When you bend and lift, use positions that put less stress on your back: Bend your knees. Keep the load close to your body. Avoid twisting. Exercise regularly as told by your health care provider. Exercising helps your back heal faster and helps prevent back injuries by keeping muscles strong and flexible. Work with a physical therapist to make a safe exercise program, as recommended by your health care provider. Do any exercises as told by your physical therapist. Lifestyle Maintain a healthy weight. Extra weight puts stress on your back and makes it difficult to have good   posture. Avoid activities or situations that make you feel anxious or stressed. Stress and anxiety increase muscle tension and can make back pain worse. Learn ways to manage anxiety and stress, such as through exercise. General instructions Sleep on a firm mattress in a comfortable position. Try lying on your side with your knees slightly bent. If you lie on your back, put a pillow under your knees. Keep your head and neck in a straight line with your spine (neutral position) when using electronic equipment like  smartphones or pads. To do this: Raise your smartphone or pad to look at it instead of bending your head or neck to look down. Put the smartphone or pad at the level of your face while looking at the screen. Follow your treatment plan as told by your health care provider. This may include: Cognitive or behavioral therapy. Acupuncture or massage therapy. Meditation or yoga. Contact a health care provider if: You have pain that is not relieved with rest or medicine. You have increasing pain going down into your legs or buttocks. Your pain does not improve after 2 weeks. You have pain at night. You lose weight without trying. You have a fever or chills. You develop nausea or vomiting. You develop abdominal pain. Get help right away if: You develop new bowel or bladder control problems. You have unusual weakness or numbness in your arms or legs. You feel faint. These symptoms may represent a serious problem that is an emergency. Do not wait to see if the symptoms will go away. Get medical help right away. Call your local emergency services (911 in the [place] Do not drive yourself to the hospital. Summary Acute back pain is sudden and usually short-lived. Use proper lifting techniques. When you bend and lift, use positions that put less stress on your back. Take over-the-counter and prescription medicines only as told by your health care provider, and apply heat or ice as told. This information is not intended to replace advice given to you by your health care provider. Make sure you discuss any questions you have with your health care provider. Document Revised: 07/25/2020 Document Reviewed: 07/25/2020 Elsevier Patient Education  2023 [employer] Inc[employer]  

## 2021-10-19 NOTE — Progress Notes (Signed)
   Virtual Visit  Note Due to COVID-19 pandemic this visit was conducted virtually. This visit type was conducted due to national recommendations for restrictions regarding the COVID-19 Pandemic (e.g. social distancing, sheltering in place) in an effort to limit this patient's exposure and mitigate transmission in our community. All issues noted in this document were discussed and addressed.  A physical exam was not performed with this format.  I connected with Evelyn [name] on 10/19/21 at 4:22 by telephone and verified that I am speaking with the correct person using two identifiers. Evelyn [name] is currently located at home and her daughter  is currently with her during visit. The provider, [name], FNP is located in their office at time of visit.  I discussed the limitations, risks, security and privacy concerns of performing an evaluation and management service by telephone and the availability of in person appointments. I also discussed with the patient that there may be a patient responsible charge related to this service. The patient expressed understanding and agreed to proceed.   History and Present Illness:  Patient was helping lift a generator and she pulled her back on 10/14/21. She went to the ed and they dx with a broken back. ( Closed fracture at L1). She has not been taking her pain meds that she already had prescribed. The ED gave her methocarbinol. Rates pain 8-9/10. Any movement or laying down increases pain. Pain does not radiate but is localized in mid back.     Review of Systems  Constitutional:  Negative for diaphoresis and weight loss.  Eyes:  Negative for blurred vision, double vision and pain.  Respiratory:  Negative for shortness of breath.   Cardiovascular:  Negative for chest pain, palpitations, orthopnea and leg swelling.  Gastrointestinal:  Negative for abdominal pain.  Skin:  Negative for rash.  Neurological:  Negative for dizziness, sensory  change, loss of consciousness, weakness and headaches.  Endo/Heme/Allergies:  Negative for polydipsia. Does not bruise/bleed easily.  Psychiatric/Behavioral:  Negative for memory loss. The patient does not have insomnia.   All other systems reviewed and are negative.   Observations/Objective: Alert and oriented- answers all questions appropriately No distress   Assessment and Plan: Evelyn [name] in today with chief complaint of No chief complaint on file.   1. Closed fracture of first lumbar vertebra, unspecified fracture morphology, initial encounter (HCC) Moist heat Rest Need  to seat up on side of bed daily Take pain meds as prescribed Ok to take motrin with it but not tylenol    Follow Up Instructions: prn    I discussed the assessment and treatment plan with the patient. The patient was provided an opportunity to ask questions and all were answered. The patient agreed with the plan and demonstrated an understanding of the instructions.   The patient was advised to call back or seek an in-person evaluation if the symptoms worsen or if the condition fails to improve as anticipated.  The above assessment and management plan was discussed with the patient. The patient verbalized understanding of and has agreed to the management plan. Patient is aware to call the clinic if symptoms persist or worsen. Patient is aware when to return to the clinic for a follow-up visit. Patient educated on when it is appropriate to go to the emergency department.   Time call ended:  4:35  I provided 13 minutes of  non face-to-face time during this encounter.    [name], FNP

## 2021-10-19 NOTE — Telephone Encounter (Signed)
Daughter calling back about this message.

## 2021-10-19 NOTE — Telephone Encounter (Signed)
Patient was seen in ER because of her legs, was not admitted to the hospital. Calling to speak to a nurse because the patient is unsure if she can come in the office. Aware that she may still need an ER follow up. Please call back and advise.

## 2021-10-20 NOTE — Telephone Encounter (Signed)
spoke with MMM yesterday -  Aware if she is not getting better, let us know

## 2021-10-23 ENCOUNTER — Telehealth: Payer: Self-pay | Admitting: Nurse Practitioner

## 2021-10-23 MED ORDER — PREDNISONE 20 MG PO TABS: 40.0000 mg | ORAL_TABLET | Freq: Every day | ORAL | 0 refills | Status: AC

## 2021-10-23 NOTE — Addendum Note (Signed)
Addended by: Bennie Pierini on: 10/23/2021 01:50 PM   Modules accepted: Orders

## 2021-10-23 NOTE — Telephone Encounter (Signed)
Aware and would like steriod sent to [facility] in [place].

## 2021-10-23 NOTE — Telephone Encounter (Signed)
Patient is still having neck pain and would like to know if there is anything that Mary-Margaret can call in for the pain. She had a televisit on 6/5. Please call back and advise.

## 2021-10-23 NOTE — Telephone Encounter (Signed)
She already has pain medication. I can call in some steroids to see if will help if she would like

## 2021-10-23 NOTE — Telephone Encounter (Signed)
Please advise 

## 2021-11-03 ENCOUNTER — Other Ambulatory Visit: Payer: Self-pay | Admitting: Nurse Practitioner

## 2021-11-05 NOTE — Progress Notes (Signed)
Carelink Summary Report / Loop Recorder 

## 2021-11-11 ENCOUNTER — Ambulatory Visit: Payer: Medicare HMO | Admitting: "Endocrinology

## 2021-11-16 LAB — CUP PACEART REMOTE DEVICE CHECK
Date Time Interrogation Session: 20230703232613
Implantable Pulse Generator Implant Date: 20200129

## 2021-11-19 ENCOUNTER — Encounter (HOSPITAL_COMMUNITY): Payer: Self-pay | Admitting: *Deleted

## 2021-11-19 ENCOUNTER — Other Ambulatory Visit: Payer: Self-pay

## 2021-11-19 ENCOUNTER — Inpatient Hospital Stay (HOSPITAL_COMMUNITY)
Admission: EM | Admit: 2021-11-19 | Discharge: 2021-11-25 | DRG: 516 | Disposition: A | Discharge status: Home / Self Care | Payer: Medicare HMO | Attending: Internal Medicine | Admitting: Internal Medicine

## 2021-11-19 DIAGNOSIS — Z79890 Hormone replacement therapy (postmenopausal): Secondary | ICD-10-CM

## 2021-11-19 DIAGNOSIS — D6859 Other primary thrombophilia: Secondary | ICD-10-CM | POA: Diagnosis present

## 2021-11-19 DIAGNOSIS — R54 Age-related physical debility: Secondary | ICD-10-CM | POA: Diagnosis present

## 2021-11-19 DIAGNOSIS — I10 Essential (primary) hypertension: Secondary | ICD-10-CM | POA: Diagnosis present

## 2021-11-19 DIAGNOSIS — Z955 Presence of coronary angioplasty implant and graft: Secondary | ICD-10-CM

## 2021-11-19 DIAGNOSIS — M549 Dorsalgia, unspecified: Secondary | ICD-10-CM | POA: Diagnosis present

## 2021-11-19 DIAGNOSIS — Z882 Allergy status to sulfonamides status: Secondary | ICD-10-CM

## 2021-11-19 DIAGNOSIS — Z809 Family history of malignant neoplasm, unspecified: Secondary | ICD-10-CM

## 2021-11-19 DIAGNOSIS — Z8249 Family history of ischemic heart disease and other diseases of the circulatory system: Secondary | ICD-10-CM

## 2021-11-19 DIAGNOSIS — R627 Adult failure to thrive: Secondary | ICD-10-CM | POA: Diagnosis present

## 2021-11-19 DIAGNOSIS — I4891 Unspecified atrial fibrillation: Secondary | ICD-10-CM | POA: Diagnosis present

## 2021-11-19 DIAGNOSIS — Z681 Body mass index (BMI) 19 or less, adult: Secondary | ICD-10-CM

## 2021-11-19 DIAGNOSIS — E039 Hypothyroidism, unspecified: Secondary | ICD-10-CM | POA: Diagnosis present

## 2021-11-19 DIAGNOSIS — R931 Abnormal findings on diagnostic imaging of heart and coronary circulation: Secondary | ICD-10-CM | POA: Diagnosis present

## 2021-11-19 DIAGNOSIS — S32010A Wedge compression fracture of first lumbar vertebra, initial encounter for closed fracture: Secondary | ICD-10-CM | POA: Diagnosis present

## 2021-11-19 DIAGNOSIS — K219 Gastro-esophageal reflux disease without esophagitis: Secondary | ICD-10-CM | POA: Diagnosis present

## 2021-11-19 DIAGNOSIS — I25708 Atherosclerosis of coronary artery bypass graft(s), unspecified, with other forms of angina pectoris: Secondary | ICD-10-CM | POA: Diagnosis present

## 2021-11-19 DIAGNOSIS — I480 Paroxysmal atrial fibrillation: Secondary | ICD-10-CM | POA: Diagnosis present

## 2021-11-19 DIAGNOSIS — I25118 Atherosclerotic heart disease of native coronary artery with other forms of angina pectoris: Secondary | ICD-10-CM | POA: Diagnosis present

## 2021-11-19 DIAGNOSIS — R52 Pain, unspecified: Secondary | ICD-10-CM

## 2021-11-19 DIAGNOSIS — I2510 Atherosclerotic heart disease of native coronary artery without angina pectoris: Secondary | ICD-10-CM | POA: Diagnosis present

## 2021-11-19 DIAGNOSIS — E782 Mixed hyperlipidemia: Secondary | ICD-10-CM | POA: Diagnosis present

## 2021-11-19 DIAGNOSIS — M5412 Radiculopathy, cervical region: Secondary | ICD-10-CM | POA: Diagnosis present

## 2021-11-19 DIAGNOSIS — M4856XA Collapsed vertebra, not elsewhere classified, lumbar region, initial encounter for fracture: Secondary | ICD-10-CM | POA: Diagnosis not present

## 2021-11-19 DIAGNOSIS — E876 Hypokalemia: Secondary | ICD-10-CM | POA: Diagnosis present

## 2021-11-19 DIAGNOSIS — D6869 Other thrombophilia: Secondary | ICD-10-CM | POA: Diagnosis present

## 2021-11-19 DIAGNOSIS — Z825 Family history of asthma and other chronic lower respiratory diseases: Secondary | ICD-10-CM

## 2021-11-19 DIAGNOSIS — Z7901 Long term (current) use of anticoagulants: Secondary | ICD-10-CM

## 2021-11-19 DIAGNOSIS — Z8673 Personal history of transient ischemic attack (TIA), and cerebral infarction without residual deficits: Secondary | ICD-10-CM

## 2021-11-19 DIAGNOSIS — Z79899 Other long term (current) drug therapy: Secondary | ICD-10-CM

## 2021-11-19 DIAGNOSIS — M5010 Cervical disc disorder with radiculopathy, unspecified cervical region: Secondary | ICD-10-CM | POA: Diagnosis present

## 2021-11-19 DIAGNOSIS — I25718 Atherosclerosis of autologous vein coronary artery bypass graft(s) with other forms of angina pectoris: Secondary | ICD-10-CM | POA: Diagnosis present

## 2021-11-19 DIAGNOSIS — Z950 Presence of cardiac pacemaker: Secondary | ICD-10-CM

## 2021-11-19 DIAGNOSIS — M5030 Other cervical disc degeneration, unspecified cervical region: Secondary | ICD-10-CM | POA: Diagnosis present

## 2021-11-19 DIAGNOSIS — Z823 Family history of stroke: Secondary | ICD-10-CM

## 2021-11-19 DIAGNOSIS — E559 Vitamin D deficiency, unspecified: Secondary | ICD-10-CM | POA: Diagnosis present

## 2021-11-19 MED ORDER — MORPHINE SULFATE (PF) 4 MG/ML IV SOLN
4.0000 mg | Freq: Once | INTRAVENOUS | Status: AC
  Administered 2021-11-19: 4 mg via INTRAVENOUS
  Filled 2021-11-19: qty 1

## 2021-11-19 NOTE — ED Triage Notes (Signed)
Hx of lumber fx few weeks, bent down today and c/o severe back pain. Pt is on Eliquis.  Rescue found in the floor. Hydrocodone  for pain at home

## 2021-11-19 NOTE — ED Provider Notes (Signed)
[facility] EMERGENCY DEPARTMENT Provider Note   CSN: [id number] Arrival date & time: 11/19/21  2040     History  Chief Complaint  Patient presents with   Back Pain    Evelyn [name] is a 81 y.o. female.  Patient is an 81 year old female with past medical history of coronary artery disease status post CABG in the past, hypothyroidism, hypertension, TIA, paroxysmal A-fib on Eliquis.  Patient presenting today with complaints of back pain.  She was seen here approximately 5 to 6 weeks ago and diagnosed with a compression fracture.  She was seen in the neurosurgery clinic, but no surgery was recommended.  Patient is taking hydrocodone at home with little relief.  Today, she attempted to get out of bed and walk when the pain became so bad she could not stand or move.  She lowered herself to the floor, then was brought here by EMS.  She denies any bowel or bladder complaints.  She denies any numbness or weakness of the legs.  The history is provided by the patient and a relative.       Home Medications Prior to Admission medications   Medication Sig Start Date End Date Taking? Authorizing Provider  apixaban (ELIQUIS) 2.5 MG TABS tablet Take 1 tablet (2.5 mg total) by mouth 2 (two) times daily. 09/29/21   Hassell Done Mary-Margaret, FNP  cyclobenzaprine (FLEXERIL) 5 MG tablet Take 1 tablet (5 mg total) by mouth 3 (three) times daily as needed for muscle spasms. 12/25/20   Hassell Done, Mary-Margaret, FNP  diltiazem (CARDIZEM) 30 MG tablet Take 1 tablet (30 mg total) by mouth every 6 (six) hours as needed (for rapid heart rate). 09/29/21   Hassell Done, Mary-Margaret, FNP  furosemide (LASIX) 20 MG tablet Take 1 tablet (20 mg total) by mouth daily. 09/29/21   Hassell Done Mary-Margaret, FNP  HYDROcodone-acetaminophen (NORCO) 5-325 MG tablet Take 1 tablet by mouth every 6 (six) hours as needed for moderate pain. 11/28/21 12/28/21  Chevis Pretty, FNP  HYDROcodone-acetaminophen (NORCO) 5-325 MG tablet Take 1 tablet  by mouth every 6 (six) hours as needed for moderate pain. 10/29/21 11/28/21  Chevis Pretty, FNP  hydrocortisone 2.5 % cream APPLY TO AFFECTED AREA TWICE A DAY 11/13/20   Hassell Done, Mary-Margaret, FNP  isosorbide mononitrate (IMDUR) 60 MG 24 hr tablet TAKE 1 TABLET (60 MG TOTAL) DAILY BY MOUTH. 09/29/21   Hassell Done Mary-Margaret, FNP  levothyroxine (SYNTHROID) 75 MCG tablet Take 1 tablet (75 mcg total) by mouth daily before breakfast. 09/29/21   Hassell Done, Mary-Margaret, FNP  loratadine (CLARITIN) 10 MG tablet TAKE 1 TABLET BY MOUTH EVERY DAY 11/13/20   Hassell Done, Mary-Margaret, FNP  methocarbamol (ROBAXIN) 500 MG tablet TAKE 1 TABLET (500 MG TOTAL) BY MOUTH EVERY 12 (TWELVE) HOURS AS NEEDED FOR MUSCLE SPASMS. 11/03/21   Hassell Done Mary-Margaret, FNP  metoprolol tartrate (LOPRESSOR) 100 MG tablet Take 1 tablet (100 mg total) by mouth 2 (two) times daily. 09/29/21   Hassell Done, Mary-Margaret, FNP  nitroGLYCERIN (NITROSTAT) 0.4 MG SL tablet PLACE 1 TABLET UNDER THE TONGUE EVERY 5 MINUTES AS NEEDED FOR CHEST PAIN. 06/12/21   Camnitz, Ocie Doyne, MD  rosuvastatin (CRESTOR) 10 MG tablet Take 1 tablet (10 mg total) by mouth daily. 09/29/21   Hassell Done Mary-Margaret, FNP  Zoledronic Acid (RECLAST IV) Inject into the vein. Once yearly    [provider]      Allergies    Sulfa antibiotics    Review of Systems   Review of Systems  All other systems reviewed and are negative.  Physical Exam Updated Vital Signs BP 139/64   Pulse 74   Temp 97.7 F (36.5 C) (Oral)   Resp 17   Ht 5\' 2"  (1.575 m)   Wt 49.4 kg   SpO2 98%   BMI 19.94 kg/m  Physical Exam Vitals and nursing note reviewed.  Constitutional:      General: She is not in acute distress.    Appearance: She is well-developed. She is not diaphoretic.  HENT:     Head: Normocephalic and atraumatic.  Cardiovascular:     Rate and Rhythm: Normal rate and regular rhythm.     Heart sounds: No murmur heard.    No friction rub. No gallop.  Pulmonary:      Effort: Pulmonary effort is normal. No respiratory distress.     Breath sounds: Normal breath sounds. No wheezing.  Abdominal:     General: Bowel sounds are normal. There is no distension.     Palpations: Abdomen is soft.     Tenderness: There is no abdominal tenderness.  Musculoskeletal:        General: Normal range of motion.     Cervical back: Normal range of motion and neck supple.     Comments: There is some tenderness in the lumbar region, but no bony tenderness or stepoff.    Skin:    General: Skin is warm and dry.  Neurological:     General: No focal deficit present.     Mental Status: She is alert and oriented to person, place, and time.     Comments: Strength is 5 out of 5 in both lower extremities.  Sensation is intact throughout both lower extremities.     ED Results / Procedures / Treatments   Labs (all labs ordered are listed, but only abnormal results are displayed) Labs Reviewed - No data to display  EKG EKG Interpretation  Date/Time:  Friday November 20 2021 01:52:13 EDT Ventricular Rate:  168 PR Interval:    QRS Duration: 84 QT Interval:  268 QTC Calculation: 448 R Axis:   61 Text Interpretation: Atrial fibrillation with rapid V-rate Ventricular premature complex Repolarization abnormality, prob rate related Confirmed by [id number] (Geoffery Lyons) on 11/20/2021 2:26:19 AM  Radiology No results found.  Procedures Procedures    Medications Ordered in ED Medications  morphine (PF) 4 MG/ML injection 4 mg (has no administration in time range)    ED Course/ Medical Decision Making/ A&P  This patient presents to the ED for concern of back pain, this involves an extensive number of treatment options, and is a complaint that carries with it a high risk of complications and morbidity.  The differential diagnosis includes compression fracture, aortic aneurysm, musculoskeletal etiology   Co morbidities that complicate the patient evaluation  History of L1  compression fracture   Additional history obtained:  No additional history or external records needed  Lab Tests:  I Ordered, and personally interpreted labs.  The pertinent results include: Unremarkable CBC and metabolic panel.   Imaging Studies ordered:  I ordered imaging studies including CT scan of the lumbar spine I independently visualized and interpreted imaging which showed L1 compression fracture with increased height loss and retropulsion I agree with the radiologist interpretation   Cardiac Monitoring: / EKG:  The patient was maintained on a cardiac monitor.  I personally viewed and interpreted the cardiac monitored which showed an underlying rhythm of: Initially sinus rhythm, but patient went into atrial fibrillation with RVR during the ER visit   Consultations  Obtained:  I requested consultation with the neurosurgeon,  and discussed lab and imaging findings as well as pertinent plan - they recommend: Admission to the hospitalist service for possible kyphoplasty on Monday   Problem List / ED Course / Critical interventions / Medication management  Patient presenting here with low back pain caused by a worsening L1 compression fracture.  This finding was discussed with Dr. Danielle Dess from neurosurgery.  He is recommending admission to the hospitalist service.  Patient may require kyphoplasty if cleared by cardiology.  Patient is currently in atrial fibrillation and is taking Eliquis. While in the ER patient went into atrial fibrillation with RVR and required Cardizem for rate control. I ordered medication including morphine for pain Reevaluation of the patient after these medicines showed that the patient improved I have reviewed the patients home medicines and have made adjustments as needed   Social Determinants of Health:  None   Test / Admission - Considered:  Patient to be admitted for pain control and possible kyphoplasty.  CRITICAL CARE Performed by:  Geoffery Lyons Total critical care time: 40 minutes Critical care time was exclusive of separately billable procedures and treating other patients. Critical care was necessary to treat or prevent imminent or life-threatening deterioration. Critical care was time spent personally by me on the following activities: development of treatment plan with patient and/or surrogate as well as nursing, discussions with consultants, evaluation of patient's response to treatment, examination of patient, obtaining history from patient or surrogate, ordering and performing treatments and interventions, ordering and review of laboratory studies, ordering and review of radiographic studies, pulse oximetry and re-evaluation of patient's condition.    Final Clinical Impression(s) / ED Diagnoses Final diagnoses:  None    Rx / DC Orders ED Discharge Orders     None         Geoffery Lyons, MD 11/20/21 [phone]

## 2021-11-20 ENCOUNTER — Emergency Department (HOSPITAL_COMMUNITY): Payer: Medicare HMO

## 2021-11-20 DIAGNOSIS — I10 Essential (primary) hypertension: Secondary | ICD-10-CM

## 2021-11-20 DIAGNOSIS — F419 Anxiety disorder, unspecified: Secondary | ICD-10-CM | POA: Diagnosis not present

## 2021-11-20 DIAGNOSIS — R931 Abnormal findings on diagnostic imaging of heart and coronary circulation: Secondary | ICD-10-CM | POA: Diagnosis not present

## 2021-11-20 DIAGNOSIS — Z7901 Long term (current) use of anticoagulants: Secondary | ICD-10-CM | POA: Diagnosis not present

## 2021-11-20 DIAGNOSIS — S32010D Wedge compression fracture of first lumbar vertebra, subsequent encounter for fracture with routine healing: Secondary | ICD-10-CM | POA: Diagnosis not present

## 2021-11-20 DIAGNOSIS — I25708 Atherosclerosis of coronary artery bypass graft(s), unspecified, with other forms of angina pectoris: Secondary | ICD-10-CM | POA: Diagnosis not present

## 2021-11-20 DIAGNOSIS — Z8673 Personal history of transient ischemic attack (TIA), and cerebral infarction without residual deficits: Secondary | ICD-10-CM | POA: Diagnosis not present

## 2021-11-20 DIAGNOSIS — I25118 Atherosclerotic heart disease of native coronary artery with other forms of angina pectoris: Secondary | ICD-10-CM | POA: Diagnosis not present

## 2021-11-20 DIAGNOSIS — R627 Adult failure to thrive: Secondary | ICD-10-CM | POA: Diagnosis not present

## 2021-11-20 DIAGNOSIS — M4856XA Collapsed vertebra, not elsewhere classified, lumbar region, initial encounter for fracture: Secondary | ICD-10-CM | POA: Diagnosis not present

## 2021-11-20 DIAGNOSIS — Z01810 Encounter for preprocedural cardiovascular examination: Secondary | ICD-10-CM | POA: Diagnosis not present

## 2021-11-20 DIAGNOSIS — Z79890 Hormone replacement therapy (postmenopausal): Secondary | ICD-10-CM | POA: Diagnosis not present

## 2021-11-20 DIAGNOSIS — R54 Age-related physical debility: Secondary | ICD-10-CM | POA: Diagnosis not present

## 2021-11-20 DIAGNOSIS — D6869 Other thrombophilia: Secondary | ICD-10-CM | POA: Diagnosis not present

## 2021-11-20 DIAGNOSIS — Z8249 Family history of ischemic heart disease and other diseases of the circulatory system: Secondary | ICD-10-CM | POA: Diagnosis not present

## 2021-11-20 DIAGNOSIS — I25728 Atherosclerosis of autologous artery coronary artery bypass graft(s) with other forms of angina pectoris: Secondary | ICD-10-CM

## 2021-11-20 DIAGNOSIS — D6859 Other primary thrombophilia: Secondary | ICD-10-CM | POA: Diagnosis not present

## 2021-11-20 DIAGNOSIS — S32010A Wedge compression fracture of first lumbar vertebra, initial encounter for closed fracture: Secondary | ICD-10-CM | POA: Diagnosis present

## 2021-11-20 DIAGNOSIS — E782 Mixed hyperlipidemia: Secondary | ICD-10-CM | POA: Diagnosis present

## 2021-11-20 DIAGNOSIS — I4891 Unspecified atrial fibrillation: Secondary | ICD-10-CM | POA: Diagnosis present

## 2021-11-20 DIAGNOSIS — E039 Hypothyroidism, unspecified: Secondary | ICD-10-CM | POA: Diagnosis not present

## 2021-11-20 DIAGNOSIS — Z882 Allergy status to sulfonamides status: Secondary | ICD-10-CM | POA: Diagnosis not present

## 2021-11-20 DIAGNOSIS — Z950 Presence of cardiac pacemaker: Secondary | ICD-10-CM | POA: Diagnosis not present

## 2021-11-20 DIAGNOSIS — Z825 Family history of asthma and other chronic lower respiratory diseases: Secondary | ICD-10-CM | POA: Diagnosis not present

## 2021-11-20 DIAGNOSIS — M549 Dorsalgia, unspecified: Secondary | ICD-10-CM | POA: Diagnosis present

## 2021-11-20 DIAGNOSIS — R001 Bradycardia, unspecified: Secondary | ICD-10-CM | POA: Diagnosis not present

## 2021-11-20 DIAGNOSIS — S32010S Wedge compression fracture of first lumbar vertebra, sequela: Secondary | ICD-10-CM | POA: Diagnosis not present

## 2021-11-20 DIAGNOSIS — I480 Paroxysmal atrial fibrillation: Secondary | ICD-10-CM | POA: Diagnosis not present

## 2021-11-20 DIAGNOSIS — Z79899 Other long term (current) drug therapy: Secondary | ICD-10-CM | POA: Diagnosis not present

## 2021-11-20 DIAGNOSIS — Z955 Presence of coronary angioplasty implant and graft: Secondary | ICD-10-CM | POA: Diagnosis not present

## 2021-11-20 DIAGNOSIS — E559 Vitamin D deficiency, unspecified: Secondary | ICD-10-CM | POA: Diagnosis present

## 2021-11-20 DIAGNOSIS — I2510 Atherosclerotic heart disease of native coronary artery without angina pectoris: Secondary | ICD-10-CM | POA: Diagnosis not present

## 2021-11-20 DIAGNOSIS — E876 Hypokalemia: Secondary | ICD-10-CM | POA: Diagnosis present

## 2021-11-20 DIAGNOSIS — K219 Gastro-esophageal reflux disease without esophagitis: Secondary | ICD-10-CM | POA: Diagnosis present

## 2021-11-20 DIAGNOSIS — M5010 Cervical disc disorder with radiculopathy, unspecified cervical region: Secondary | ICD-10-CM | POA: Diagnosis not present

## 2021-11-20 DIAGNOSIS — Z681 Body mass index (BMI) 19 or less, adult: Secondary | ICD-10-CM | POA: Diagnosis not present

## 2021-11-20 DIAGNOSIS — S32010G Wedge compression fracture of first lumbar vertebra, subsequent encounter for fracture with delayed healing: Secondary | ICD-10-CM

## 2021-11-20 LAB — COMPREHENSIVE METABOLIC PANEL
ALT: 9 U/L (ref 0–44)
AST: 11 U/L — ABNORMAL LOW (ref 15–41)
Albumin: 3.2 g/dL — ABNORMAL LOW (ref 3.5–5.0)
Alkaline Phosphatase: 53 U/L (ref 38–126)
Anion gap: 9 (ref 5–15)
BUN: 8 mg/dL (ref 8–23)
CO2: 22 mmol/L (ref 22–32)
Calcium: 8.1 mg/dL — ABNORMAL LOW (ref 8.9–10.3)
Chloride: 108 mmol/L (ref 98–111)
Creatinine, Ser: 0.46 mg/dL (ref 0.44–1.00)
GFR, Estimated: >60 mL/min (ref >60)
Glucose, Bld: 93 mg/dL (ref 70–99)
Potassium: 3.2 mmol/L — ABNORMAL LOW (ref 3.5–5.1)
Sodium: 139 mmol/L (ref 135–145)
Total Bilirubin: 0.8 mg/dL (ref 0.3–1.2)
Total Protein: 6.5 g/dL (ref 6.5–8.1)

## 2021-11-20 LAB — CBC WITH DIFFERENTIAL/PLATELET
Abs Immature Granulocytes: 0.02 10*3/uL (ref 0.00–0.07)
Abs Immature Granulocytes: 0.03 10*3/uL (ref 0.00–0.07)
Basophils Absolute: 0 10*3/uL (ref 0.0–0.1)
Basophils Absolute: 0 10*3/uL (ref 0.0–0.1)
Basophils Relative: 0 %
Basophils Relative: 0 %
Eosinophils Absolute: 0 10*3/uL (ref 0.0–0.5)
Eosinophils Absolute: 0 10*3/uL (ref 0.0–0.5)
Eosinophils Relative: 0 %
Eosinophils Relative: 1 %
HCT: 38.7 % (ref 36.0–46.0)
HCT: 40.2 % (ref 36.0–46.0)
Hemoglobin: 12.8 g/dL (ref 12.0–15.0)
Hemoglobin: 13 g/dL (ref 12.0–15.0)
Immature Granulocytes: 0 %
Immature Granulocytes: 0 %
Lymphocytes Relative: 32 %
Lymphocytes Relative: 36 %
Lymphs Abs: 2.5 10*3/uL (ref 0.7–4.0)
Lymphs Abs: 2.7 10*3/uL (ref 0.7–4.0)
MCH: 30.4 pg (ref 26.0–34.0)
MCH: 30.5 pg (ref 26.0–34.0)
MCHC: 32.3 g/dL (ref 30.0–36.0)
MCHC: 33.1 g/dL (ref 30.0–36.0)
MCV: 92.1 fL (ref 80.0–100.0)
MCV: 93.9 fL (ref 80.0–100.0)
Monocytes Absolute: 0.9 10*3/uL (ref 0.1–1.0)
Monocytes Absolute: 0.9 10*3/uL (ref 0.1–1.0)
Monocytes Relative: 11 %
Monocytes Relative: 13 %
Neutro Abs: 3.7 10*3/uL (ref 1.7–7.7)
Neutro Abs: 4.4 10*3/uL (ref 1.7–7.7)
Neutrophils Relative %: 50 %
Neutrophils Relative %: 57 %
Platelets: 210 10*3/uL (ref 150–400)
Platelets: 212 10*3/uL (ref 150–400)
RBC: 4.2 MIL/uL (ref 3.87–5.11)
RBC: 4.28 MIL/uL (ref 3.87–5.11)
RDW: 15 % (ref 11.5–15.5)
RDW: 15.4 % (ref 11.5–15.5)
WBC: 7.4 10*3/uL (ref 4.0–10.5)
WBC: 7.9 10*3/uL (ref 4.0–10.5)
nRBC: 0 % (ref 0.0–0.2)
nRBC: 0 % (ref 0.0–0.2)

## 2021-11-20 LAB — BASIC METABOLIC PANEL
Anion gap: 9 (ref 5–15)
BUN: 11 mg/dL (ref 8–23)
CO2: 22 mmol/L (ref 22–32)
Calcium: 8.6 mg/dL — ABNORMAL LOW (ref 8.9–10.3)
Chloride: 108 mmol/L (ref 98–111)
Creatinine, Ser: 0.53 mg/dL (ref 0.44–1.00)
GFR, Estimated: >60 mL/min (ref >60)
Glucose, Bld: 95 mg/dL (ref 70–99)
Potassium: 3.4 mmol/L — ABNORMAL LOW (ref 3.5–5.1)
Sodium: 139 mmol/L (ref 135–145)

## 2021-11-20 LAB — TSH: TSH: 17.071 u[IU]/mL — ABNORMAL HIGH (ref 0.350–4.500)

## 2021-11-20 LAB — MAGNESIUM: Magnesium: 1.9 mg/dL (ref 1.7–2.4)

## 2021-11-20 LAB — VITAMIN D 25 HYDROXY (VIT D DEFICIENCY, FRACTURES): Vit D, 25-Hydroxy: 7.75 ng/mL — ABNORMAL LOW (ref 30–100)

## 2021-11-20 LAB — T4, FREE: Free T4: 0.78 ng/dL (ref 0.61–1.12)

## 2021-11-20 MED ORDER — ACETAMINOPHEN 650 MG RE SUPP: 650.0000 mg | Freq: Four times a day (QID) | RECTAL | Status: DC

## 2021-11-20 MED ORDER — ACETAMINOPHEN 325 MG PO TABS: 650.0000 mg | ORAL_TABLET | Freq: Four times a day (QID) | ORAL | Status: DC | PRN

## 2021-11-20 MED ORDER — ONDANSETRON HCL 4 MG/2ML IJ SOLN
4.0000 mg | Freq: Four times a day (QID) | INTRAMUSCULAR | Status: DC | PRN
Start: 2021-11-20 — End: 2021-11-24

## 2021-11-20 MED ORDER — DILTIAZEM HCL 60 MG PO TABS
30.0000 mg | ORAL_TABLET | Freq: Four times a day (QID) | ORAL | Status: DC | PRN
  Administered 2021-11-20: 30 mg via ORAL
  Filled 2021-11-20: qty 1

## 2021-11-20 MED ORDER — METHOCARBAMOL 500 MG PO TABS: 500.0000 mg | ORAL_TABLET | Freq: Two times a day (BID) | ORAL | Status: DC | PRN

## 2021-11-20 MED ORDER — METOPROLOL TARTRATE 100 MG PO TABS
100.0000 mg | ORAL_TABLET | Freq: Two times a day (BID) | ORAL | Status: DC
  Administered 2021-11-20 – 2021-11-25 (×11): 100 mg via ORAL
  Filled 2021-11-20 (×10): qty 1
  Filled 2021-11-20: qty 2

## 2021-11-20 MED ORDER — ISOSORBIDE MONONITRATE ER 60 MG PO TB24
60.0000 mg | ORAL_TABLET | Freq: Every day | ORAL | Status: DC
  Administered 2021-11-20: 60 mg via ORAL
  Filled 2021-11-20 (×2): qty 1

## 2021-11-20 MED ORDER — ROSUVASTATIN CALCIUM 5 MG PO TABS
10.0000 mg | ORAL_TABLET | Freq: Every day | ORAL | Status: DC
  Administered 2021-11-20 – 2021-11-25 (×6): 10 mg via ORAL
  Filled 2021-11-20 (×4): qty 2
  Filled 2021-11-20: qty 1
  Filled 2021-11-20: qty 2

## 2021-11-20 MED ORDER — DILTIAZEM HCL-DEXTROSE 125-5 MG/125ML-% IV SOLN (PREMIX)
5.0000 mg/h | INTRAVENOUS | Status: DC
  Administered 2021-11-20: 5 mg/h via INTRAVENOUS
  Filled 2021-11-20: qty 125

## 2021-11-20 MED ORDER — DILTIAZEM LOAD VIA INFUSION
10.0000 mg | Freq: Once | INTRAVENOUS | Status: AC
  Administered 2021-11-20: 10 mg via INTRAVENOUS
  Filled 2021-11-20: qty 10

## 2021-11-20 MED ORDER — HYDROMORPHONE HCL 1 MG/ML IJ SOLN: 0.5000 mg | INTRAMUSCULAR | Status: DC | PRN

## 2021-11-20 MED ORDER — LORATADINE 10 MG PO TABS
10.0000 mg | ORAL_TABLET | Freq: Every day | ORAL | Status: DC
  Administered 2021-11-20 – 2021-11-22 (×3): 10 mg via ORAL
  Filled 2021-11-20 (×6): qty 1

## 2021-11-20 MED ORDER — ONDANSETRON HCL 4 MG PO TABS
4.0000 mg | ORAL_TABLET | Freq: Four times a day (QID) | ORAL | Status: DC | PRN
Start: 2021-11-20 — End: 2021-11-24

## 2021-11-20 MED ORDER — MORPHINE SULFATE (PF) 2 MG/ML IV SOLN: 2.0000 mg | INTRAVENOUS | Status: DC | PRN

## 2021-11-20 MED ORDER — HYDROCODONE-ACETAMINOPHEN 5-325 MG PO TABS
1.0000 | ORAL_TABLET | Freq: Four times a day (QID) | ORAL | Status: DC | PRN
  Administered 2021-11-22 – 2021-11-23 (×2): 1 via ORAL
  Filled 2021-11-20 (×2): qty 1

## 2021-11-20 MED ORDER — ACETAMINOPHEN 325 MG PO TABS
650.0000 mg | ORAL_TABLET | Freq: Four times a day (QID) | ORAL | Status: DC
  Administered 2021-11-20 – 2021-11-25 (×16): 650 mg via ORAL
  Filled 2021-11-20 (×16): qty 2

## 2021-11-20 MED ORDER — POTASSIUM CHLORIDE 10 MEQ/100ML IV SOLN
10.0000 meq | INTRAVENOUS | Status: AC
  Administered 2021-11-20 (×3): 10 meq via INTRAVENOUS
  Filled 2021-11-20 (×3): qty 100

## 2021-11-20 MED ORDER — HEPARIN SODIUM (PORCINE) 5000 UNIT/ML IJ SOLN
5000.0000 [IU] | Freq: Three times a day (TID) | INTRAMUSCULAR | Status: DC
  Administered 2021-11-20 – 2021-11-25 (×14): 5000 [IU] via SUBCUTANEOUS
  Filled 2021-11-20 (×14): qty 1

## 2021-11-20 MED ORDER — ACETAMINOPHEN 650 MG RE SUPP: 650.0000 mg | Freq: Four times a day (QID) | RECTAL | Status: DC | PRN

## 2021-11-20 MED ORDER — LEVOTHYROXINE SODIUM 75 MCG PO TABS
75.0000 ug | ORAL_TABLET | Freq: Every day | ORAL | Status: DC
  Administered 2021-11-20 – 2021-11-25 (×5): 75 ug via ORAL
  Filled 2021-11-20 (×2): qty 1
  Filled 2021-11-20: qty 2
  Filled 2021-11-20 (×2): qty 1

## 2021-11-20 NOTE — Assessment & Plan Note (Signed)
-   Patient has failed conservative management at home and after speaking with Dr. Danielle Dess with neurosurgery patient is going to be considered for kyphoplasty procedure if cleared by cardiology. - IV pain management, continue TLSO brace

## 2021-11-20 NOTE — Assessment & Plan Note (Signed)
-   Presenting with uncontrolled pain, IV pain management ordered

## 2021-11-20 NOTE — ED Notes (Addendum)
Pt heart rate noted to be in the 60s. Captured another EKG and showed sinus rhythm. Hospitalist made aware and orders to stop drip and give cardizem PO to see if pt maintains normal rate.

## 2021-11-20 NOTE — Consult Note (Signed)
Reason for Consult: L1 compression fracture Referring Physician: Dr. Jabier Gauss Evelyn [name] is an 81 y.o. female.  HPI: Patient is an 81 year old individual who apparently sustained a compression fracture back in May 31.  She had been seen in our office by Elyn Peers and was advised regarding conservative treatment.  I was not aware of this when I was called for the consult and had noted that the patient could be considered for outpatient kyphoplasty however the patient was transferred to [facility] and because of her intractable back pain and consultation is now requested regarding kyphoplasty.  The patient has a cardiac history and has been on Eliquis for atrial fibrillation.  Past Medical History:  Diagnosis Date   Aneurysm ([place])    Aortic insufficiency    Atrial fibrillation (HCC)    CAD (coronary artery disease)    CABG LIMA to LAD,vein graft to diagonal,vein graft to OM   Dyslipidemia    GERD (gastroesophageal reflux disease)    Hypothyroid     Past Surgical History:  Procedure Laterality Date   ABDOMINAL HYSTERECTOMY     BACK SURGERY  2009   CORONARY ANGIOPLASTY  2003   cutting balloon atherectomy in-stent stenosis of the LAD.   CORONARY ANGIOPLASTY WITH STENT PLACEMENT  2002   remote PCI & stenting LAD,   CORONARY ARTERY BYPASS GRAFT  2004   LIMA to LAD,vein graft to diagonal,vein graft to OM   FOOT SURGERY     LEFT HEART CATH AND CORS/GRAFTS ANGIOGRAPHY N/A 02/23/2017   Procedure: LEFT HEART CATH AND CORS/GRAFTS ANGIOGRAPHY;  Surgeon: Wellington Hampshire, MD;  Location: [place] [facility];  Service: Cardiovascular;  Laterality: N/A;   LEFT HEART CATHETERIZATION WITH CORONARY ANGIOGRAM N/A 12/15/2011   Procedure: LEFT HEART CATHETERIZATION WITH CORONARY ANGIOGRAM;  Surgeon: Sanda Klein, MD;  Location: [place] [facility];  Service: Cardiovascular;  Laterality: N/A;   LOOP RECORDER INSERTION N/A 06/14/2018   Procedure: LOOP RECORDER INSERTION;  Surgeon: Sanda Klein,  MD;  Location: [place] [facility];  Service: Cardiovascular;  Laterality: N/A;   NECK SURGERY     NM MYOCAR PERF WALL MOTION  12/01/2011   normal study-persistent exercise induced ECG changes raises the concern for ischemia (balanced ischemia).   NOSE SURGERY     PACEMAKER INSERTION     TONSILLECTOMY  1948   TUBAL LIGATION      Family History  Problem Relation Age of Onset   Heart attack Mother    Heart failure Father    Hypertension Father    Heart disease Father    Asthma Maternal Grandmother    Heart disease Sister    Stroke Brother    Voice disorder Daughter        vocal dystonia   Stroke Daughter    Stroke Son    Cancer Sister 14    Social History:  reports that she has never smoked. She has never used smokeless tobacco. She reports that she does not drink alcohol and does not use drugs.  Allergies:  Allergies  Allergen Reactions   Sulfa Antibiotics Swelling    Medications: Prior to Admission:  Medications Prior to Admission  Medication Sig Dispense Refill Last Dose   apixaban (ELIQUIS) 2.5 MG TABS tablet Take 1 tablet (2.5 mg total) by mouth 2 (two) times daily. 180 tablet 1 11/19/2021 at AM   diltiazem (CARDIZEM) 30 MG tablet Take 1 tablet (30 mg total) by mouth every 6 (six) hours as needed (for rapid  heart rate). 90 tablet 1 11/19/2021   furosemide (LASIX) 20 MG tablet Take 1 tablet (20 mg total) by mouth daily. 90 tablet 1 Past Week   [START ON 11/28/2021] HYDROcodone-acetaminophen (NORCO) 5-325 MG tablet Take 1 tablet by mouth every 6 (six) hours as needed for moderate pain. 120 tablet 0 Past Week   hydrocortisone 2.5 % cream APPLY TO AFFECTED AREA TWICE A DAY (Patient taking differently: Apply 1 Application topically daily as needed (itching).) 28.35 g 6 UNK   isosorbide mononitrate (IMDUR) 60 MG 24 hr tablet TAKE 1 TABLET (60 MG TOTAL) DAILY BY MOUTH. (Patient taking differently: Take 60 mg by mouth daily.) 90 tablet 1 11/19/2021   levothyroxine (SYNTHROID) 75 MCG  tablet Take 1 tablet (75 mcg total) by mouth daily before breakfast. 90 tablet 1 11/19/2021   loratadine (CLARITIN) 10 MG tablet TAKE 1 TABLET BY MOUTH EVERY DAY (Patient taking differently: Take 10 mg by mouth daily as needed for allergies.) 90 tablet 3 UNK   methocarbamol (ROBAXIN) 500 MG tablet TAKE 1 TABLET (500 MG TOTAL) BY MOUTH EVERY 12 (TWELVE) HOURS AS NEEDED FOR MUSCLE SPASMS. 20 tablet 0 Past Week   metoprolol tartrate (LOPRESSOR) 100 MG tablet Take 1 tablet (100 mg total) by mouth 2 (two) times daily. 180 tablet 1 11/19/2021 at AM   rosuvastatin (CRESTOR) 10 MG tablet Take 1 tablet (10 mg total) by mouth daily. (Patient taking differently: Take 30 mg by mouth daily.) 90 tablet 1 11/19/2021   Zoledronic Acid (RECLAST IV) Inject into the vein. Once yearly   2022   nitroGLYCERIN (NITROSTAT) 0.4 MG SL tablet PLACE 1 TABLET UNDER THE TONGUE EVERY 5 MINUTES AS NEEDED FOR CHEST PAIN. (Patient taking differently: Place 0.4 mg under the tongue every 5 (five) minutes as needed for chest pain.) 25 tablet 2 UNK    Results for orders placed or performed during the hospital encounter of 11/19/21 (from the past 48 hour(s))  Basic metabolic panel     Status: Abnormal   Collection Time: 11/20/21 12:03 AM  Result Value Ref Range   Sodium 139 135 - 145 mmol/L   Potassium 3.4 (L) 3.5 - 5.1 mmol/L   Chloride 108 98 - 111 mmol/L   CO2 22 22 - 32 mmol/L   Glucose, Bld 95 70 - 99 mg/dL    Comment: Glucose reference range applies only to samples taken after fasting for at least 8 hours.   BUN 11 8 - 23 mg/dL   Creatinine, Ser 0.53 0.44 - 1.00 mg/dL   Calcium 8.6 (L) 8.9 - 10.3 mg/dL   GFR, Estimated >60 >60 mL/min    Comment: (NOTE) Calculated using the CKD-EPI Creatinine Equation (2021)    Anion gap 9 5 - 15    Comment: Performed at [facility], [address], [place], [place] [address]  CBC with Differential     Status: None   Collection Time: 11/20/21 12:03 AM  Result Value Ref Range   WBC 7.9 4.0 -  10.5 K/uL   RBC 4.20 3.87 - 5.11 MIL/uL   Hemoglobin 12.8 12.0 - 15.0 g/dL   HCT 38.7 36.0 - 46.0 %   MCV 92.1 80.0 - 100.0 fL   MCH 30.5 26.0 - 34.0 pg   MCHC 33.1 30.0 - 36.0 g/dL   RDW 15.0 11.5 - 15.5 %   Platelets 210 150 - 400 K/uL   nRBC 0.0 0.0 - 0.2 %   Neutrophils Relative % 57 %   Neutro Abs 4.4  1.7 - 7.7 K/uL   Lymphocytes Relative 32 %   Lymphs Abs 2.5 0.7 - 4.0 K/uL   Monocytes Relative 11 %   Monocytes Absolute 0.9 0.1 - 1.0 K/uL   Eosinophils Relative 0 %   Eosinophils Absolute 0.0 0.0 - 0.5 K/uL   Basophils Relative 0 %   Basophils Absolute 0.0 0.0 - 0.1 K/uL   Immature Granulocytes 0 %   Abs Immature Granulocytes 0.03 0.00 - 0.07 K/uL    Comment: Performed at [facility], [address] [place], [place] [address]  Comprehensive metabolic panel     Status: Abnormal   Collection Time: 11/20/21  6:02 AM  Result Value Ref Range   Sodium 139 135 - 145 mmol/L   Potassium 3.2 (L) 3.5 - 5.1 mmol/L   Chloride 108 98 - 111 mmol/L   CO2 22 22 - 32 mmol/L   Glucose, Bld 93 70 - 99 mg/dL    Comment: Glucose reference range applies only to samples taken after fasting for at least 8 hours.   BUN 8 8 - 23 mg/dL   Creatinine, Ser 1.76 0.44 - 1.00 mg/dL   Calcium 8.1 (L) 8.9 - 10.3 mg/dL   Total Protein 6.5 6.5 - 8.1 g/dL   Albumin 3.2 (L) 3.5 - 5.0 g/dL   AST 11 (L) 15 - 41 U/L   ALT 9 0 - 44 U/L   Alkaline Phosphatase 53 38 - 126 U/L   Total Bilirubin 0.8 0.3 - 1.2 mg/dL   GFR, Estimated >16 >07 mL/min    Comment: (NOTE) Calculated using the CKD-EPI Creatinine Equation (2021)    Anion gap 9 5 - 15    Comment: Performed at [facility], [address] [place], [place] [address]  Magnesium     Status: None   Collection Time: 11/20/21  6:02 AM  Result Value Ref Range   Magnesium 1.9 1.7 - 2.4 mg/dL    Comment: Performed at [facility], [address], [place], [place] [address]  CBC with Differential/Platelet     Status: None   Collection Time: 11/20/21  6:02 AM   Result Value Ref Range   WBC 7.4 4.0 - 10.5 K/uL   RBC 4.28 3.87 - 5.11 MIL/uL   Hemoglobin 13.0 12.0 - 15.0 g/dL   HCT 54.6 27.0 - 35.0 %   MCV 93.9 80.0 - 100.0 fL   MCH 30.4 26.0 - 34.0 pg   MCHC 32.3 30.0 - 36.0 g/dL   RDW 09.3 81.8 - 29.9 %   Platelets 212 150 - 400 K/uL   nRBC 0.0 0.0 - 0.2 %   Neutrophils Relative % 50 %   Neutro Abs 3.7 1.7 - 7.7 K/uL   Lymphocytes Relative 36 %   Lymphs Abs 2.7 0.7 - 4.0 K/uL   Monocytes Relative 13 %   Monocytes Absolute 0.9 0.1 - 1.0 K/uL   Eosinophils Relative 1 %   Eosinophils Absolute 0.0 0.0 - 0.5 K/uL   Basophils Relative 0 %   Basophils Absolute 0.0 0.0 - 0.1 K/uL   Immature Granulocytes 0 %   Abs Immature Granulocytes 0.02 0.00 - 0.07 K/uL    Comment: Performed at [facility], [address] [place], [place] [address]  TSH     Status: Abnormal   Collection Time: 11/20/21  6:02 AM  Result Value Ref Range   TSH 17.071 (H) 0.350 - 4.500 uIU/mL    Comment: Performed by a 3rd Generation assay with a functional sensitivity  of <=0.01 uIU/mL. Performed at [facility], [address] [place], [place] [address]   T4, free     Status: None   Collection Time: 11/20/21  7:44 AM  Result Value Ref Range   Free T4 0.78 0.61 - 1.12 ng/dL    Comment: (NOTE) Biotin ingestion may interfere with free T4 tests. If the results are inconsistent with the TSH level, previous test results, or the clinical presentation, then consider biotin interference. If needed, order repeat testing after stopping biotin. Performed at [facility], [address] [place], [place] [address]   VITAMIN D 25 Hydroxy (Vit-D Deficiency, Fractures)     Status: Abnormal   Collection Time: 11/20/21  7:49 AM  Result Value Ref Range   Vit D, 25-Hydroxy 7.75 (L) 30 - 100 ng/mL    Comment: (NOTE) Vitamin D deficiency has been defined by the Institute of Medicine  and an Endocrine Society practice guideline as a level of serum 25-OH  vitamin D less than 20  ng/mL (1,2). The Endocrine Society went on to  further define vitamin D insufficiency as a level between 21 and 29  ng/mL (2).  1. IOM (Institute of Medicine). 2010. Dietary reference intakes for  calcium and D. [place]: The National Academies Press. 2. Holick MF, Binkley , Bischoff-Ferrari HA, et al. Evaluation,  treatment, and prevention of vitamin D deficiency: an Endocrine  Society clinical practice guideline, JCEM. 2011 Jul; 96(7): 1911-30.  Performed at [facility], [address] [place],  [address]     CT Lumbar Spine Wo Contrast  Result Date: 11/20/2021 CLINICAL DATA:  Low back pain with recent fracture EXAM: CT LUMBAR SPINE WITHOUT CONTRAST TECHNIQUE: Multidetector CT imaging of the lumbar spine was performed without intravenous contrast administration. Multiplanar CT image reconstructions were also generated. RADIATION DOSE REDUCTION: This exam was performed according to the departmental dose-optimization program which includes automated exposure control, adjustment of the mA and/or kV according to patient size and/or use of iterative reconstruction technique. COMPARISON:  10/14/2021 FINDINGS: Segmentation: Partial lumbarization of S1. Numbering is unchanged from prior study. Alignment: Grade 1 anterolisthesis at L4-5 Vertebrae: Progressive height loss at the fractured L1 level, now approximately 50%. There is now 3 mm of retropulsion of the posterosuperior corner. No new fracture site. Paraspinal and other soft tissues: Calcific aortic atherosclerosis. Disc levels: Multilevel degenerative disc disease without high-grade spinal canal stenosis. Aside from the L1 level, the appearance is unchanged. IMPRESSION: 1. Progressive height loss at the fractured L1 level, now approximately 50%. 3 mm of retropulsion of the posterosuperior corner is new; but there is no spinal canal stenosis. 2. No new fracture site. 3. Aortic Atherosclerosis (ICD10-I70.0). Electronically Signed    By: Kevin  Herman M.D.   On: 11/20/2021 01:28    Review of Systems  Musculoskeletal:  Positive for back pain and gait problem.  Neurological:  Positive for weakness.  All other systems reviewed and are negative.  Blood pressure (!) 103/58, pulse (!) 55, temperature (!) 97.3 F (36.3 C), temperature source Oral, resp. rate 18, height 5' 2" (1.575 m), weight 51.4 kg, SpO2 95 %. Physical Exam Constitutional:      Appearance: Normal appearance. She is normal weight.  HENT:     Head: Normocephalic and atraumatic.     Right Ear: Tympanic membrane, ear canal and external ear normal.     Left Ear: Tympanic membrane, ear canal and external ear normal.     Nose: Nose normal.       Mouth/Throat:     Mouth: Mucous membranes are moist.     Pharynx: Oropharynx is clear.  Eyes:     Extraocular Movements: Extraocular movements intact.     Conjunctiva/sclera: Conjunctivae normal.     Pupils: Pupils are equal, round, and reactive to light.  Cardiovascular:     Rate and Rhythm: Normal rate. Rhythm irregular.     Pulses: Normal pulses.     Heart sounds: Normal heart sounds.  Pulmonary:     Effort: Pulmonary effort is normal.     Breath sounds: Normal breath sounds.  Abdominal:     General: Abdomen is flat. Bowel sounds are normal.     Palpations: Abdomen is soft.  Musculoskeletal:        General: Normal range of motion.     Cervical back: Normal range of motion and neck supple.     Comments: Modest tenderness to percussion in the mid lumbar spine at the thoracolumbar junction  Skin:    General: Skin is warm and dry.     Capillary Refill: Capillary refill takes less than 2 seconds.  Neurological:     General: No focal deficit present.     Mental Status: She is alert and oriented to person, place, and time.  Psychiatric:        Mood and Affect: Mood normal.        Behavior: Behavior normal.        Thought Content: Thought content normal.        Judgment: Judgment normal.      Assessment/Plan: Patient with a subacute L1 compression fracture with pain that has been poorly tolerated.  She has been seen as an outpatient and advised regarding conservative management on June 20.  At that time she rated her pain at a 5 of 10.  Patient notes that the pain is never gone below that and she wishes something more definitive to be done.  I have advised that she would be a candidate for an acrylic balloon kyphoplasty of the L1 vertebrae.  Given her history of anticoagulation with Eliquis she will need to be off this medication for several days and will likely require some bridging with Lovenox or heparin as medicine would see fit.  I cannot schedule patient's kyphoplasty until Tuesday.  The patient is comfortable with this however if for the sake of expedient's the patient would like to have her procedure sooner I noted that interventional radiology could perform the procedure perhaps on Monday.  I will plan on scheduling her for Tuesday but would not at all be offended if the patient's desire was to have the procedure done sooner in which case interventional radiology may be consulted for this purpose.  Evelyn [name] 11/20/2021, 7:08 PM

## 2021-11-20 NOTE — Assessment & Plan Note (Signed)
-   Treated with IV diltiazem infusion in the ED and has been discontinued as rate has improved and resumed home metoprolol 100 mg twice daily

## 2021-11-20 NOTE — Consult Note (Signed)
Cardiology Consultation:   Patient ID: Evelyn [name] MRN: [id number]; DOB: [birth year]  Admit date: 11/19/2021 Date of Consult: 11/20/2021  PCP:  Chevis Pretty, [place] [facility] Cardiologist:  Sanda Klein, MD  Electrophysiologist:  Will Meredith Leeds, MD       Patient Profile:   Evelyn [name] is a 81 y.o. female with a hx of CABG who is being seen 11/20/2021 for the evaluation of preop risk stratification prior to kyphoplasty at the request of Dr. Wynetta Emery.  History of Present Illness:   Evelyn [name] is an 81 year old female with prior CABG in 2004 with subsequent heart catheterization in 2018 resulting in medical management with normal ejection fraction here with severe back pain.  She is being sent to [facility] for kyphoplasty L1 worsening compression fracture with Dr. Ellene Route.  Here in the emergency department she was found to be in atrial fibrillation with rapid ventricular response with heart rates in the 160s.  She was given IV diltiazem originally and then placed back on her metoprolol oral 100 mg twice a day that she takes normally at home.  She has subsequently converted back to sinus rhythm/sinus bradycardia in the 50s on telemetry in the emergency department.  She has been taking isosorbide 60 mg for anginal symptoms.  She also has as needed diltiazem 30 mg to take as needed for rapid heart rate at home.  She has been on chronic anticoagulation with Eliquis 2.5 mg twice a day.  This obviously is being held for her upcoming kyphoplasty.  She denies any current chest discomfort, significant shortness of breath no syncope.  No bleeding.  Occasionally she will have chest discomfort type symptoms which have been well controlled on her antianginal medications.   Past Medical History:  Diagnosis Date   Aneurysm ([place])    Aortic insufficiency    Atrial fibrillation (HCC)    CAD (coronary artery disease)    CABG LIMA to LAD,vein graft to  diagonal,vein graft to OM   Dyslipidemia    GERD (gastroesophageal reflux disease)    Hypothyroid     Past Surgical History:  Procedure Laterality Date   ABDOMINAL HYSTERECTOMY     BACK SURGERY  2009   CORONARY ANGIOPLASTY  2003   cutting balloon atherectomy in-stent stenosis of the LAD.   CORONARY ANGIOPLASTY WITH STENT PLACEMENT  2002   remote PCI & stenting LAD,   CORONARY ARTERY BYPASS GRAFT  2004   LIMA to LAD,vein graft to diagonal,vein graft to OM   FOOT SURGERY     LEFT HEART CATH AND CORS/GRAFTS ANGIOGRAPHY N/A 02/23/2017   Procedure: LEFT HEART CATH AND CORS/GRAFTS ANGIOGRAPHY;  Surgeon: Wellington Hampshire, MD;  Location: [facility];  Service: Cardiovascular;  Laterality: N/A;   LEFT HEART CATHETERIZATION WITH CORONARY ANGIOGRAM N/A 12/15/2011   Procedure: LEFT HEART CATHETERIZATION WITH CORONARY ANGIOGRAM;  Surgeon: Sanda Klein, MD;  Location: [facility];  Service: Cardiovascular;  Laterality: N/A;   LOOP RECORDER INSERTION N/A 06/14/2018   Procedure: LOOP RECORDER INSERTION;  Surgeon: Sanda Klein, MD;  Location: [facility];  Service: Cardiovascular;  Laterality: N/A;   NECK SURGERY     NM MYOCAR PERF WALL MOTION  12/01/2011   normal study-persistent exercise induced ECG changes raises the concern for ischemia (balanced ischemia).   NOSE SURGERY     PACEMAKER INSERTION     TONSILLECTOMY  1948   TUBAL LIGATION       Home Medications:  Prior to Admission medications   Medication Sig Start Date End Date Taking? Authorizing Provider  apixaban (ELIQUIS) 2.5 MG TABS tablet Take 1 tablet (2.5 mg total) by mouth 2 (two) times daily. 09/29/21  Yes Hassell Done, Mary-Margaret, FNP  diltiazem (CARDIZEM) 30 MG tablet Take 1 tablet (30 mg total) by mouth every 6 (six) hours as needed (for rapid heart rate). 09/29/21  Yes Dalziel, Mary-Margaret, FNP  furosemide (LASIX) 20 MG tablet Take 1 tablet (20 mg total) by mouth daily. 09/29/21  Yes Hassell Done, Mary-Margaret, FNP   HYDROcodone-acetaminophen (NORCO) 5-325 MG tablet Take 1 tablet by mouth every 6 (six) hours as needed for moderate pain. 11/28/21 12/28/21 Yes Filip, Mary-Margaret, FNP  hydrocortisone 2.5 % cream APPLY TO AFFECTED AREA TWICE A DAY Patient taking differently: Apply 1 Application topically daily as needed (itching). 11/13/20  Yes Isensee, Mary-Margaret, FNP  isosorbide mononitrate (IMDUR) 60 MG 24 hr tablet TAKE 1 TABLET (60 MG TOTAL) DAILY BY MOUTH. Patient taking differently: Take 60 mg by mouth daily. 09/29/21  Yes Hassell Done, Mary-Margaret, FNP  levothyroxine (SYNTHROID) 75 MCG tablet Take 1 tablet (75 mcg total) by mouth daily before breakfast. 09/29/21  Yes Hassell Done, Mary-Margaret, FNP  loratadine (CLARITIN) 10 MG tablet TAKE 1 TABLET BY MOUTH EVERY DAY Patient taking differently: Take 10 mg by mouth daily as needed for allergies. 11/13/20  Yes Kerstein, Mary-Margaret, FNP  methocarbamol (ROBAXIN) 500 MG tablet TAKE 1 TABLET (500 MG TOTAL) BY MOUTH EVERY 12 (TWELVE) HOURS AS NEEDED FOR MUSCLE SPASMS. 11/03/21  Yes Hassell Done, Mary-Margaret, FNP  metoprolol tartrate (LOPRESSOR) 100 MG tablet Take 1 tablet (100 mg total) by mouth 2 (two) times daily. 09/29/21  Yes Mccollom, Mary-Margaret, FNP  rosuvastatin (CRESTOR) 10 MG tablet Take 1 tablet (10 mg total) by mouth daily. Patient taking differently: Take 30 mg by mouth daily. 09/29/21  Yes Hassell Done, Mary-Margaret, FNP  Zoledronic Acid (RECLAST IV) Inject into the vein. Once yearly   Yes [provider]  nitroGLYCERIN (NITROSTAT) 0.4 MG SL tablet PLACE 1 TABLET UNDER THE TONGUE EVERY 5 MINUTES AS NEEDED FOR CHEST PAIN. Patient taking differently: Place 0.4 mg under the tongue every 5 (five) minutes as needed for chest pain. 06/12/21   Camnitz, Ocie Doyne, MD    Inpatient Medications: Scheduled Meds:  acetaminophen  650 mg Oral Q6H   Or   acetaminophen  650 mg Rectal Q6H   heparin  5,000 Units Subcutaneous Q8H   isosorbide mononitrate  60 mg Oral Daily    levothyroxine  75 mcg Oral QAC breakfast   loratadine  10 mg Oral Daily   metoprolol tartrate  100 mg Oral BID   rosuvastatin  10 mg Oral Daily   Continuous Infusions:  PRN Meds: diltiazem, HYDROcodone-acetaminophen, HYDROmorphone (DILAUDID) injection, methocarbamol, ondansetron **OR** ondansetron (ZOFRAN) IV  Allergies:    Allergies  Allergen Reactions   Sulfa Antibiotics Swelling    Social History:   Social History   Socioeconomic History   Marital status: Widowed    Spouse name: Not on file   Number of children: 2   Years of education: Associates   Highest education level: Associate degree: academic program  Occupational History   Occupation: Retired  Tobacco Use   Smoking status: Never   Smokeless tobacco: Never  Vaping Use   Vaping Use: Never used  Substance and Sexual Activity   Alcohol use: No   Drug use: No   Sexual activity: Not Currently  Other Topics Concern   Not on file  Social History  Narrative   Mother of 2 with 4 grandchildren   Right-handed   Caffeine: 8-10 cups coffee   Social Determinants of Health   Financial Resource Strain: Low Risk  (02/26/2021)   Overall Financial Resource Strain (CARDIA)    Difficulty of Paying Living Expenses: Not hard at all  Food Insecurity: No Food Insecurity (02/26/2021)   Hunger Vital Sign    Worried About Running Out of Food in the Last Year: Never true    Ran Out of Food in the Last Year: Never true  Transportation Needs: No Transportation Needs (02/26/2021)   PRAPARE - Hydrologist (Medical): No    Lack of Transportation (Non-Medical): No  Physical Activity: Sufficiently Active (02/26/2021)   Exercise Vital Sign    Days of Exercise per Week: 5 days    Minutes of Exercise per Session: 30 min  Stress: No Stress Concern Present (02/26/2021)   [place]    Feeling of Stress : Only a little  Social Connections:  Moderately Integrated (02/26/2021)   Social Connection and Isolation Panel [NHANES]    Frequency of Communication with Friends and Family: More than three times a week    Frequency of Social Gatherings with Friends and Family: Once a week    Attends Religious Services: 1 to 4 times per year    Active Member of [employer] or Organizations: Yes    Attends Archivist Meetings: More than 4 times per year    Marital Status: Widowed  Intimate Partner Violence: Not At Risk (02/26/2021)   Humiliation, Afraid, Rape, and Kick questionnaire    Fear of Current or Ex-Partner: No    Emotionally Abused: No    Physically Abused: No    Sexually Abused: No    Family History:    Family History  Problem Relation Age of Onset   Heart attack Mother    Heart failure Father    Hypertension Father    Heart disease Father    Asthma Maternal Grandmother    Heart disease Sister    Stroke Brother    Voice disorder Daughter        vocal dystonia   Stroke Daughter    Stroke Son    Cancer Sister 64     ROS:  Please see the history of present illness.   All other ROS reviewed and negative.     Physical Exam/Data:   Vitals:   11/20/21 0430 11/20/21 0455 11/20/21 0545 11/20/21 1100  BP: 109/73 (!) 107/95 113/62 (!) 120/53  Pulse: (!) 142 (!) 126 69 66  Resp: 17 (!) 22 (!) 25 19  Temp:      TempSrc:      SpO2: 91% 90% 94% 95%  Weight:      Height:        Intake/Output Summary (Last 24 hours) at 11/20/2021 1153 Last data filed at 11/20/2021 0900 Gross per 24 hour  Intake 100 ml  Output --  Net 100 ml      11/19/2021    8:45 PM 10/14/2021    8:33 AM 09/29/2021   12:04 PM  Last 3 Weights  Weight (lbs) 109 lb 120 lb 121 lb  Weight (kg) 49.442 kg 54.432 kg 54.885 kg     Body mass index is 19.94 kg/m.  General:  Well nourished, well developed, in no acute distress HEENT: normal Neck: no JVD Vascular: No carotid bruits; Distal pulses 2+ bilaterally Cardiac:  normal S1, S2; bradycardic; no  murmur ,prior CABG scar Lungs:  clear to auscultation bilaterally, no wheezing, rhonchi or rales  Abd: soft, nontender, no hepatomegaly  Ext: no edema Musculoskeletal:  No deformities, BUE and BLE strength normal and equal Skin: warm and dry  Neuro:  CNs 2-12 intact, no focal abnormalities noted Psych:  Normal affect   EKG:  The EKG was personally reviewed and demonstrates: EKG earlier this admit showed atrial fibrillation rapid ventricular response heart rate 160 bpm with ST segment depression.  Rate related.  Telemetry:  Telemetry was personally reviewed and demonstrates: Atrial fibrillation with improved rate control  Relevant CV Studies:  ECHO 2018: - Left ventricle: The cavity size was normal. Systolic function was    normal. The estimated ejection fraction was in the range of 55%    to 60%. Mild hypokinesis of the inferior and inferoseptal    myocardium. Doppler parameters are consistent with abnormal left    ventricular relaxation (grade 1 diastolic dysfunction). Doppler    parameters are consistent with indeterminate ventricular filling    pressure.  - Aortic valve: Transvalvular velocity was within the normal range.    There was no stenosis. There was mild regurgitation.  - Aorta: Ascending aortic diameter: 37 mm (S).  - Ascending aorta: The ascending aorta was mildly dilated.  - Mitral valve: Transvalvular velocity was within the normal range.    There was no evidence for stenosis. There was mild regurgitation.  - Right ventricle: The cavity size was normal. Wall thickness was    normal. Systolic function was normal.  - Atrial septum: No defect or patent foramen ovale was identified    by color flow Doppler or saline microcavitation study.  - Tricuspid valve: There was moderate regurgitation.  - Pulmonary arteries: Systolic pressure was within the normal    range. PA peak pressure: 27 mm Hg (S).   Cardiac catheterization 2018-see below for details-medical  management  Laboratory Data:  High Sensitivity Troponin:  No results for input(s): "TROPONINIHS" in the last 720 hours.   Chemistry Recent Labs  Lab 11/20/21 0003 11/20/21 0602  NA 139 139  K 3.4* 3.2*  CL 108 108  CO2 22 22  GLUCOSE 95 93  BUN 11 8  CREATININE 0.53 0.46  CALCIUM 8.6* 8.1*  MG  --  1.9  GFRNONAA >60 >60  ANIONGAP 9 9    Recent Labs  Lab 11/20/21 0602  PROT 6.5  ALBUMIN 3.2*  AST 11*  ALT 9  ALKPHOS 53  BILITOT 0.8   Lipids No results for input(s): "CHOL", "TRIG", "HDL", "LABVLDL", "LDLCALC", "CHOLHDL" in the last 168 hours.  Hematology Recent Labs  Lab 11/20/21 0003 11/20/21 0602  WBC 7.9 7.4  RBC 4.20 4.28  HGB 12.8 13.0  HCT 38.7 40.2  MCV 92.1 93.9  MCH 30.5 30.4  MCHC 33.1 32.3  RDW 15.0 15.4  PLT 210 212   Thyroid  Recent Labs  Lab 11/20/21 0602  TSH 17.071*    BNPNo results for input(s): "BNP", "PROBNP" in the last 168 hours.  DDimer No results for input(s): "DDIMER" in the last 168 hours.   Radiology/Studies:  CT Lumbar Spine Wo Contrast  Result Date: 11/20/2021 CLINICAL DATA:  Low back pain with recent fracture EXAM: CT LUMBAR SPINE WITHOUT CONTRAST TECHNIQUE: Multidetector CT imaging of the lumbar spine was performed without intravenous contrast administration. Multiplanar CT image reconstructions were also generated. RADIATION DOSE REDUCTION: This exam was performed according to the departmental dose-optimization program  which includes automated exposure control, adjustment of the mA and/or kV according to patient size and/or use of iterative reconstruction technique. COMPARISON:  10/14/2021 FINDINGS: Segmentation: Partial lumbarization of S1. Numbering is unchanged from prior study. Alignment: Grade 1 anterolisthesis at L4-5 Vertebrae: Progressive height loss at the fractured L1 level, now approximately 50%. There is now 3 mm of retropulsion of the posterosuperior corner. No new fracture site. Paraspinal and other soft tissues:  Calcific aortic atherosclerosis. Disc levels: Multilevel degenerative disc disease without high-grade spinal canal stenosis. Aside from the L1 level, the appearance is unchanged. IMPRESSION: 1. Progressive height loss at the fractured L1 level, now approximately 50%. 3 mm of retropulsion of the posterosuperior corner is new; but there is no spinal canal stenosis. 2. No new fracture site. 3. Aortic Atherosclerosis (ICD10-I70.0). Electronically Signed   By: Deatra Robinson M.D.   On: 11/20/2021 01:28     Assessment and Plan:   81 year old with prior CABG, paroxysmal atrial fibrillation now back to sinus bradycardia with severe back pain being transferred to [facility] for kyphoplasty with Dr. Danielle Dess.  We are being asked to see her for cardiac risk stratification prior to procedure.  Preop cardiac risk stratification - Procedure is planned for Monday, kyphoplasty which is a relatively low risk procedure. - With her current back pain, she was found to be in atrial fibrillation with RVR and was placed on Cardizem infusion in the emergency department at [facility].  After rate control was attained, she was given her metoprolol 100 mg twice a day which she takes at home.  She is now back in sinus bradycardia.  Continue with this home medication strategy. -Prior CABG in 2004, cardiac catheterization in 2018 demonstrated patent LIMA to LAD and patent SVG to first diagonal.  The SVG to OM was known to be occluded.  Normal LV function was noted.  Medical management ensued.  Isosorbide 30 mg was added at that time.  She currently takes 60 mg. -Based upon lack of significant anginal symptoms, no severe valvular disease noted, no high risk arrhythmias, currently well controlled atrial fibrillation, she may proceed with kyphoplasty with moderate overall cardiac risk based upon her prior CABG history.  Coronary artery disease status post CABG 2004 - Cardiac catheterization as above reviewed.  Stable anginal  symptoms.  Continuing with metoprolol, isosorbide.  Paroxysmal atrial fibrillation -She is back in sinus bradycardia. - If she does have episodes at home, she will take an occasional as needed dose of diltiazem 30 mg.  Continue with metoprolol 100 mg twice a day.  Eliquis is on hold for kyphoplasty.  Resume when able from a surgical standpoint.   Risk Assessment/Risk Scores:        CHA2DS2-VASc Score = 5   This indicates a 7.2% annual risk of stroke. The patient's score is based upon: CHF History: 0 HTN History: 1 Diabetes History: 0 Stroke History: 0 Vascular Disease History: 1 Age Score: 2 Gender Score: 1       For questions or updates, please contact [facility] Please consult [email or web address] for contact info under    Signed, Donato Schultz, MD  11/20/2021 11:53 AM

## 2021-11-20 NOTE — Assessment & Plan Note (Signed)
-   Check magnesium and oral replacement ordered, recheck BMP in a.m.

## 2021-11-20 NOTE — Assessment & Plan Note (Signed)
-   Temporarily holding apixaban in anticipation for possible procedure if cleared by cardiology

## 2021-11-20 NOTE — Progress Notes (Signed)
Notified cardmaster for cardiology team to follow on rounds tomorrow at [facility].

## 2021-11-20 NOTE — Assessment & Plan Note (Signed)
-   Resume home metoprolol 100 mg twice daily follow closely

## 2021-11-20 NOTE — Assessment & Plan Note (Signed)
-   Resume all home cardiac medications with the exception of apixaban which is temporarily being held for possible procedure 11/23/2021

## 2021-11-20 NOTE — Hospital Course (Signed)
81 y/o female with paroxysmal atrial fibrillation chronically anticoagulated with apixaban, longstanding CAD status post stent placement, status post bypass surgery LIMA to LAD, SVG to diagonal and SVG to OM in 2004 and hyperlipidemia, chronic pain chronic cervical disc disease, prior back and neck surgeries, chronic pain, hypothyroidism, hypertension, osteoporosis who presented to the emergency department with acute uncontrolled back pain.  She had been seen at [facility], ED on 10/14/2021 and diagnosed with an L1 compression fracture.  She was placed in a TLSO brace and given pain management and discharged home and followed up with the [place] neurosurgery clinic.  She has been taking hydrocodone at home but no relief in pain.  She apparently attempted to get out of bed and walk and the pain became so severe she could not stand or move and called EMS to bring her to the emergency department.  CT scan reveals worsening L1 compression fracture.  This was discussed with neurosurgeon Dr. Danielle Dess who recommended that patient be admitted to the hospitalist service at [facility] and neurosurgery will consult for consideration of kyphoplasty if patient is cleared by cardiology for the procedure.  The plan would be for her to have the procedure on Monday if cleared and requested that apixaban be held.  She was noted to be in atrial fibrillation with RVR and briefly was placed on a Cardizem infusion and after rate has been controlled she is now back on oral metoprolol 100 mg twice daily which she takes normally.  She will be admitted to [facility] service at [facility].

## 2021-11-20 NOTE — H&P (Signed)
History and Physical  [facility]  Evelyn [name] DUK:[id number] DOB: [birth year] DOA: 11/19/2021  PCP: Bennie Pierini, FNP  Patient coming from: Home by [facility] Level of care: Telemetry Cardiac  I have personally briefly reviewed patient's old medical records in [facility]  Chief Complaint: severe back pain   HPI: Evelyn [name] is a 81 y/o female with paroxysmal atrial fibrillation chronically anticoagulated with apixaban, longstanding CAD status post stent placement, status post bypass surgery LIMA to LAD, SVG to diagonal and SVG to OM in 2004 and hyperlipidemia, chronic pain chronic cervical disc disease, prior back and neck surgeries, chronic pain, hypothyroidism, hypertension, osteoporosis who presented to the emergency department with acute uncontrolled back pain.  She had been seen at [facility], ED on 10/14/2021 and diagnosed with an L1 compression fracture.  She was placed in a TLSO brace and given pain management and discharged home and followed up with the [place] neuro[facility] clinic.  She has been taking hydrocodone at home but no relief in pain.  She apparently attempted to get out of bed and walk and the pain became so severe she could not stand or move and called EMS to bring her to the emergency department.  CT scan reveals worsening L1 compression fracture.  This was discussed with neurosurgeon Dr. Danielle Dess who recommended that patient be admitted to the hospitalist service at [facility] and neurosurgery will consult for consideration of kyphoplasty if patient is cleared by cardiology for the procedure.  The plan would be for her to have the procedure on Monday if cleared and requested that apixaban be held.  She was noted to be in atrial fibrillation with RVR and briefly was placed on a Cardizem infusion and after rate has been controlled she is now back on oral metoprolol 100 mg twice daily which she takes normally.  She will be admitted to [facility] service at [facility].  Review of Systems: Review of Systems  Constitutional: Negative.   HENT: Negative.    Eyes: Negative.   Respiratory: Negative.    Cardiovascular: Negative.   Gastrointestinal: Negative.   Genitourinary: Negative.   Musculoskeletal:  Positive for back pain and falls.  Skin: Negative.   Neurological: Negative.   Endo/Heme/Allergies: Negative.   Psychiatric/Behavioral: Negative.    All other systems reviewed and are negative.    Past Medical History:  Diagnosis Date   Aneurysm (HCC)    Aortic insufficiency    Atrial fibrillation (HCC)    CAD (coronary artery disease)    CABG LIMA to LAD,vein graft to diagonal,vein graft to OM   Dyslipidemia    GERD (gastroesophageal reflux disease)    Hypothyroid     Past Surgical History:  Procedure Laterality Date   ABDOMINAL HYSTERECTOMY     BACK SURGERY  2009   CORONARY ANGIOPLASTY  2003   cutting balloon atherectomy in-stent stenosis of the LAD.   CORONARY ANGIOPLASTY WITH STENT PLACEMENT  2002   remote PCI & stenting LAD,   CORONARY ARTERY BYPASS GRAFT  2004   LIMA to LAD,vein graft to diagonal,vein graft to OM   FOOT SURGERY     LEFT HEART CATH AND CORS/GRAFTS ANGIOGRAPHY N/A 02/23/2017   Procedure: LEFT HEART CATH AND CORS/GRAFTS ANGIOGRAPHY;  Surgeon: Iran Ouch, MD;  Location: [facility] INVASIVE CV LAB;  Service: Cardiovascular;  Laterality: N/A;   LEFT HEART CATHETERIZATION WITH CORONARY ANGIOGRAM N/A 12/15/2011   Procedure: LEFT HEART CATHETERIZATION WITH CORONARY ANGIOGRAM;  Surgeon: Thurmon Fair, MD;  Location: [facility] CATH LAB;  Service: Cardiovascular;  Laterality: N/A;   LOOP RECORDER INSERTION N/A 06/14/2018   Procedure: LOOP RECORDER INSERTION;  Surgeon: Thurmon Fair, MD;  Location: [facility] INVASIVE CV LAB;  Service: Cardiovascular;  Laterality: N/A;   NECK SURGERY     NM MYOCAR PERF WALL MOTION  12/01/2011   normal study-persistent exercise induced ECG changes raises the concern for ischemia (balanced ischemia).    NOSE SURGERY     PACEMAKER INSERTION     TONSILLECTOMY  1948   TUBAL LIGATION       reports that she has never smoked. She has never used smokeless tobacco. She reports that she does not drink alcohol and does not use drugs.  Allergies  Allergen Reactions   Sulfa Antibiotics Swelling    Family History  Problem Relation Age of Onset   Heart attack Mother    Heart failure Father    Hypertension Father    Heart disease Father    Asthma Maternal Grandmother    Heart disease Sister    Stroke Brother    Voice disorder Daughter        vocal dystonia   Stroke Daughter    Stroke Son    Cancer Sister 49    Prior to Admission medications   Medication Sig Start Date End Date Taking? Authorizing Provider  apixaban (ELIQUIS) 2.5 MG TABS tablet Take 1 tablet (2.5 mg total) by mouth 2 (two) times daily. 09/29/21   Daphine Deutscher Mary-Margaret, FNP  cyclobenzaprine (FLEXERIL) 5 MG tablet Take 1 tablet (5 mg total) by mouth 3 (three) times daily as needed for muscle spasms. 12/25/20   Daphine Deutscher, Mary-Margaret, FNP  diltiazem (CARDIZEM) 30 MG tablet Take 1 tablet (30 mg total) by mouth every 6 (six) hours as needed (for rapid heart rate). 09/29/21   Daphine Deutscher, Mary-Margaret, FNP  furosemide (LASIX) 20 MG tablet Take 1 tablet (20 mg total) by mouth daily. 09/29/21   Daphine Deutscher Mary-Margaret, FNP  HYDROcodone-acetaminophen (NORCO) 5-325 MG tablet Take 1 tablet by mouth every 6 (six) hours as needed for moderate pain. 11/28/21 12/28/21  Bennie Pierini, FNP  HYDROcodone-acetaminophen (NORCO) 5-325 MG tablet Take 1 tablet by mouth every 6 (six) hours as needed for moderate pain. 10/29/21 11/28/21  Bennie Pierini, FNP  hydrocortisone 2.5 % cream APPLY TO AFFECTED AREA TWICE A DAY 11/13/20   Daphine Deutscher, Mary-Margaret, FNP  isosorbide mononitrate (IMDUR) 60 MG 24 hr tablet TAKE 1 TABLET (60 MG TOTAL) DAILY BY MOUTH. 09/29/21   Daphine Deutscher Mary-Margaret, FNP  levothyroxine (SYNTHROID) 75 MCG tablet Take 1 tablet (75 mcg  total) by mouth daily before breakfast. 09/29/21   Daphine Deutscher, Mary-Margaret, FNP  loratadine (CLARITIN) 10 MG tablet TAKE 1 TABLET BY MOUTH EVERY DAY 11/13/20   Daphine Deutscher, Mary-Margaret, FNP  methocarbamol (ROBAXIN) 500 MG tablet TAKE 1 TABLET (500 MG TOTAL) BY MOUTH EVERY 12 (TWELVE) HOURS AS NEEDED FOR MUSCLE SPASMS. 11/03/21   Daphine Deutscher Mary-Margaret, FNP  metoprolol tartrate (LOPRESSOR) 100 MG tablet Take 1 tablet (100 mg total) by mouth 2 (two) times daily. 09/29/21   Daphine Deutscher, Mary-Margaret, FNP  nitroGLYCERIN (NITROSTAT) 0.4 MG SL tablet PLACE 1 TABLET UNDER THE TONGUE EVERY 5 MINUTES AS NEEDED FOR CHEST PAIN. 06/12/21   Camnitz, Andree Coss, MD  rosuvastatin (CRESTOR) 10 MG tablet Take 1 tablet (10 mg total) by mouth daily. 09/29/21   Daphine Deutscher Mary-Margaret, FNP  Zoledronic Acid (RECLAST IV) Inject into the vein. Once yearly    [provider]    Physical Exam: Vitals:  11/20/21 0400 11/20/21 0430 11/20/21 0455 11/20/21 0545  BP: 104/70 109/73 (!) 107/95 113/62  Pulse: 67 (!) 142 (!) 126 69  Resp: (!) 30 17 (!) 22 (!) 25  Temp:      TempSrc:      SpO2: 95% 91% 90% 94%  Weight:      Height:       Constitutional: frail, elderly female, awake, alert, moderately distressed with pain, uncomfortable. Eyes: PERRL, lids and conjunctivae normal ENMT: Mucous membranes are moist. Posterior pharynx clear of any exudate or lesions.Normal dentition.  Neck: normal, supple, no masses, no thyromegaly Respiratory: clear to auscultation bilaterally, no wheezing, no crackles. Normal respiratory effort. No accessory muscle use.  Cardiovascular: normal s1, s2 sounds, no murmurs / rubs / gallops. No extremity edema. 2+ pedal pulses. No carotid bruits.  Abdomen: no tenderness, no masses palpated. No hepatosplenomegaly. Bowel sounds positive.  Musculoskeletal: no clubbing / cyanosis. No joint deformity upper and lower extremities. Good ROM, no contractures. Normal muscle tone.  Skin: no rashes, lesions, ulcers.  No induration Neurologic: CN 2-12 grossly intact. Sensation intact, DTR normal. Strength 5/5 in all 4.  Psychiatric: Normal judgment and insight. Alert and oriented x 3. Normal mood.   Labs on Admission: I have personally reviewed following labs and imaging studies  CBC: Recent Labs  Lab 11/20/21 0003 11/20/21 0602  WBC 7.9 7.4  NEUTROABS 4.4 3.7  HGB 12.8 13.0  HCT 38.7 40.2  MCV 92.1 93.9  PLT 210 [id number]   Basic Metabolic Panel: Recent Labs  Lab 11/20/21 0003 11/20/21 0602  NA 139 139  K 3.4* 3.2*  CL 108 108  CO2 22 22  GLUCOSE 95 93  BUN 11 8  CREATININE 0.53 0.46  CALCIUM 8.6* 8.1*  MG  --  1.9   GFR: Estimated Creatinine Clearance: 41.6 mL/min (by C-G formula based on SCr of 0.46 mg/dL). Liver Function Tests: Recent Labs  Lab 11/20/21 0602  AST 11*  ALT 9  ALKPHOS 53  BILITOT 0.8  PROT 6.5  ALBUMIN 3.2*   No results for input(s): "LIPASE", "AMYLASE" in the last 168 hours. No results for input(s): "AMMONIA" in the last 168 hours. Coagulation Profile: No results for input(s): "INR", "PROTIME" in the last 168 hours. Cardiac Enzymes: No results for input(s): "CKTOTAL", "CKMB", "CKMBINDEX", "TROPONINI" in the last 168 hours. BNP (last 3 results) No results for input(s): "PROBNP" in the last 8760 hours. HbA1C: No results for input(s): "HGBA1C" in the last 72 hours. CBG: No results for input(s): "GLUCAP" in the last 168 hours. Lipid Profile: No results for input(s): "CHOL", "HDL", "LDLCALC", "TRIG", "CHOLHDL", "LDLDIRECT" in the last 72 hours. Thyroid Function Tests: Recent Labs    11/20/21 0602  TSH 17.071*   Anemia Panel: No results for input(s): "VITAMINB12", "FOLATE", "FERRITIN", "TIBC", "IRON", "RETICCTPCT" in the last 72 hours. Urine analysis:    Component Value Date/Time   COLORURINE YELLOW 09/01/2016 0849   APPEARANCEUR Clear 06/25/2020 1629   LABSPEC <1.005 (L) 09/01/2016 0849   PHURINE 5.5 09/01/2016 0849   GLUCOSEU Negative 06/25/2020  1629   HGBUR SMALL (A) 09/01/2016 0849   BILIRUBINUR Negative 06/25/2020 [place] 09/01/2016 0849   PROTEINUR Negative 06/25/2020 1629   PROTEINUR NEGATIVE 09/01/2016 0849   NITRITE Negative 06/25/2020 1629   NITRITE NEGATIVE 09/01/2016 0849   LEUKOCYTESUR 1+ (A) 06/25/2020 1629    Radiological Exams on Admission: CT Lumbar Spine Wo Contrast  Result Date: 11/20/2021 CLINICAL DATA:  Low back pain with  recent fracture EXAM: CT LUMBAR SPINE WITHOUT CONTRAST TECHNIQUE: Multidetector CT imaging of the lumbar spine was performed without intravenous contrast administration. Multiplanar CT image reconstructions were also generated. RADIATION DOSE REDUCTION: This exam was performed according to the departmental dose-optimization program which includes automated exposure control, adjustment of the mA and/or kV according to patient size and/or use of iterative reconstruction technique. COMPARISON:  10/14/2021 FINDINGS: Segmentation: Partial lumbarization of S1. Numbering is unchanged from prior study. Alignment: Grade 1 anterolisthesis at L4-5 Vertebrae: Progressive height loss at the fractured L1 level, now approximately 50%. There is now 3 mm of retropulsion of the posterosuperior corner. No new fracture site. Paraspinal and other soft tissues: Calcific aortic atherosclerosis. Disc levels: Multilevel degenerative disc disease without high-grade spinal canal stenosis. Aside from the L1 level, the appearance is unchanged. IMPRESSION: 1. Progressive height loss at the fractured L1 level, now approximately 50%. 3 mm of retropulsion of the posterosuperior corner is new; but there is no spinal canal stenosis. 2. No new fracture site. 3. Aortic Atherosclerosis (ICD10-I70.0). Electronically Signed   By: Ulyses Jarred M.D.   On: 11/20/2021 01:28    EKG: Independently reviewed. Atrial fibrillation with RVR   Assessment/Plan Principal Problem:   Atrial fibrillation with RVR (HCC) Active Problems:    Compression fracture of L1 vertebra (HCC)   Abnormal nuclear cardiac imaging test   CAD, PCI '02, '04. 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)   Pain management   DDD (degenerative disc disease), cervical   Mixed hyperlipidemia   Long term (current) use of anticoagulants   Paroxysmal atrial fibrillation (HCC)   Cervical radiculopathy at C5   Intractable back pain   Acquired thrombophilia (HCC)   Failure to thrive in adult   Hypokalemia   Assessment and Plan: * Atrial fibrillation with RVR ([place]) - Treated with IV diltiazem infusion in the ED and has been discontinued as rate has improved and resumed home metoprolol 100 mg twice daily  Compression fracture of L1 vertebra ([place]) - Patient has failed conservative management at home and after speaking with Dr. Ellene Route with neurosurgery patient is going to be considered for kyphoplasty procedure if cleared by cardiology. - IV pain management, continue TLSO brace  Hypokalemia - Check magnesium and oral replacement ordered, recheck BMP in a.m.  Failure to thrive in adult - Secondary to uncontrolled pain and difficulty ambulating due to worsening L1 compression fracture  Acquired thrombophilia (HCC) - Temporarily holding apixaban in anticipation for possible procedure if cleared by cardiology  Intractable back pain - Failed outpatient management now considering kyphoplasty as noted  Paroxysmal atrial fibrillation ([place]) - Resume home metoprolol for rate control and has been weaned off the Cardizem infusion and temporarily holding apixaban as noted  Long term (current) use of anticoagulants - Temporarily holding apixaban  Mixed hyperlipidemia - Resume home statin therapy  DDD (degenerative disc disease), cervical - Currently stable no acute symptoms  Pain management - Presenting with uncontrolled pain, IV pain management  ordered  Essential hypertension - Resume home metoprolol 100 mg twice daily follow closely  Hypothyroidism - Plan to resume home levothyroxine daily - TSH markedly elevated greater than 17 check free T4  CAD, PCI '02, '04. CABG X 3 2004 - Resume all home cardiac medications with the exception of apixaban which is temporarily being held for possible procedure 11/23/2021  Abnormal nuclear cardiac imaging test - Significant  CAD by history will request cardiology for preoperative evaluation   DVT prophylaxis: SCDs  Code Status: Full   Family Communication: bedside update 7/7  Disposition Plan: TBD  Consults called: neurosurgery Ellene Route), cardiology (preoperative)  Admission status: INP  Level of care: Telemetry Cardiac Irwin Brakeman MD [facility] How to contact the [facility] Attending or Consulting provider [place] or covering provider during after hours [place], for this patient?  Check the care team in [facility] and look for a) attending/consulting [facility] provider listed and b) the [facility] team listed Log into [email or web address] and use 's universal password to access. If you do not have the password, please contact the hospital operator. Locate the [facility] provider you are looking for under [facility] and page to a number that you can be directly reached. If you still have difficulty reaching the provider, please page the [facility] (Director on Call) for the Hospitalists listed on amion for assistance.   If 7PM-7AM, please contact night-coverage [email or web address] Password [facility]  11/20/2021, 7:57 AM

## 2021-11-20 NOTE — Assessment & Plan Note (Signed)
-   Secondary to uncontrolled pain and difficulty ambulating due to worsening L1 compression fracture

## 2021-11-20 NOTE — Assessment & Plan Note (Signed)
-   Resume home metoprolol for rate control and has been weaned off the Cardizem infusion and temporarily holding apixaban as noted

## 2021-11-20 NOTE — Assessment & Plan Note (Signed)
-   Currently stable no acute symptoms

## 2021-11-20 NOTE — Assessment & Plan Note (Signed)
-   Significant CAD by history will request cardiology for preoperative evaluation

## 2021-11-20 NOTE — Assessment & Plan Note (Signed)
-   Failed outpatient management now considering kyphoplasty as noted

## 2021-11-20 NOTE — Progress Notes (Signed)
Patient transferred from [facility] at 1326hrs.  Oriented to plan of care and room.  Patient verbalized understanding.  [facility] and neurosurgery notified of patients arrival.

## 2021-11-20 NOTE — Assessment & Plan Note (Signed)
-   Plan to resume home levothyroxine daily - TSH markedly elevated greater than 17 check free T4

## 2021-11-20 NOTE — Progress Notes (Signed)
  Transition of Care [facility]) Screening Note   Patient Details  Name: Evelyn [name] Date of Birth: 1940-10-14   Transition of Care [facility]) CM/SW Contact:    Villa Herb, LCSWA Phone Number: 11/20/2021, 12:26 PM    Transition of Care Department [facility]) has reviewed patient and no TOC needs have been identified at this time. We will continue to monitor patient advancement through interdisciplinary progression rounds. If new patient transition needs arise, please place a TOC consult.

## 2021-11-20 NOTE — Assessment & Plan Note (Signed)
Resume home statin therapy  ?

## 2021-11-20 NOTE — H&P (View-Only) (Signed)
Reason for Consult: L1 compression fracture Referring Physician: Dr. Jabier Gauss Evelyn [name] is an 81 y.o. female.  HPI: Patient is an 81 year old individual who apparently sustained a compression fracture back in May 31.  She had been seen in our office by Elyn Peers and was advised regarding conservative treatment.  I was not aware of this when I was called for the consult and had noted that the patient could be considered for outpatient kyphoplasty however the patient was transferred to [facility] and because of her intractable back pain and consultation is now requested regarding kyphoplasty.  The patient has a cardiac history and has been on Eliquis for atrial fibrillation.  Past Medical History:  Diagnosis Date   Aneurysm ([place])    Aortic insufficiency    Atrial fibrillation (HCC)    CAD (coronary artery disease)    CABG LIMA to LAD,vein graft to diagonal,vein graft to OM   Dyslipidemia    GERD (gastroesophageal reflux disease)    Hypothyroid     Past Surgical History:  Procedure Laterality Date   ABDOMINAL HYSTERECTOMY     BACK SURGERY  2009   CORONARY ANGIOPLASTY  2003   cutting balloon atherectomy in-stent stenosis of the LAD.   CORONARY ANGIOPLASTY WITH STENT PLACEMENT  2002   remote PCI & stenting LAD,   CORONARY ARTERY BYPASS GRAFT  2004   LIMA to LAD,vein graft to diagonal,vein graft to OM   FOOT SURGERY     LEFT HEART CATH AND CORS/GRAFTS ANGIOGRAPHY N/A 02/23/2017   Procedure: LEFT HEART CATH AND CORS/GRAFTS ANGIOGRAPHY;  Surgeon: Wellington Hampshire, MD;  Location: [place] [facility];  Service: Cardiovascular;  Laterality: N/A;   LEFT HEART CATHETERIZATION WITH CORONARY ANGIOGRAM N/A 12/15/2011   Procedure: LEFT HEART CATHETERIZATION WITH CORONARY ANGIOGRAM;  Surgeon: Sanda Klein, MD;  Location: [place] [facility];  Service: Cardiovascular;  Laterality: N/A;   LOOP RECORDER INSERTION N/A 06/14/2018   Procedure: LOOP RECORDER INSERTION;  Surgeon: Sanda Klein,  MD;  Location: [place] [facility];  Service: Cardiovascular;  Laterality: N/A;   NECK SURGERY     NM MYOCAR PERF WALL MOTION  12/01/2011   normal study-persistent exercise induced ECG changes raises the concern for ischemia (balanced ischemia).   NOSE SURGERY     PACEMAKER INSERTION     TONSILLECTOMY  1948   TUBAL LIGATION      Family History  Problem Relation Age of Onset   Heart attack Mother    Heart failure Father    Hypertension Father    Heart disease Father    Asthma Maternal Grandmother    Heart disease Sister    Stroke Brother    Voice disorder Daughter        vocal dystonia   Stroke Daughter    Stroke Son    Cancer Sister 57    Social History:  reports that she has never smoked. She has never used smokeless tobacco. She reports that she does not drink alcohol and does not use drugs.  Allergies:  Allergies  Allergen Reactions   Sulfa Antibiotics Swelling    Medications: Prior to Admission:  Medications Prior to Admission  Medication Sig Dispense Refill Last Dose   apixaban (ELIQUIS) 2.5 MG TABS tablet Take 1 tablet (2.5 mg total) by mouth 2 (two) times daily. 180 tablet 1 11/19/2021 at AM   diltiazem (CARDIZEM) 30 MG tablet Take 1 tablet (30 mg total) by mouth every 6 (six) hours as needed (for rapid  heart rate). 90 tablet 1 11/19/2021   furosemide (LASIX) 20 MG tablet Take 1 tablet (20 mg total) by mouth daily. 90 tablet 1 Past Week   [START ON 11/28/2021] HYDROcodone-acetaminophen (NORCO) 5-325 MG tablet Take 1 tablet by mouth every 6 (six) hours as needed for moderate pain. 120 tablet 0 Past Week   hydrocortisone 2.5 % cream APPLY TO AFFECTED AREA TWICE A DAY (Patient taking differently: Apply 1 Application topically daily as needed (itching).) 28.35 g 6 UNK   isosorbide mononitrate (IMDUR) 60 MG 24 hr tablet TAKE 1 TABLET (60 MG TOTAL) DAILY BY MOUTH. (Patient taking differently: Take 60 mg by mouth daily.) 90 tablet 1 11/19/2021   levothyroxine (SYNTHROID) 75 MCG  tablet Take 1 tablet (75 mcg total) by mouth daily before breakfast. 90 tablet 1 11/19/2021   loratadine (CLARITIN) 10 MG tablet TAKE 1 TABLET BY MOUTH EVERY DAY (Patient taking differently: Take 10 mg by mouth daily as needed for allergies.) 90 tablet 3 UNK   methocarbamol (ROBAXIN) 500 MG tablet TAKE 1 TABLET (500 MG TOTAL) BY MOUTH EVERY 12 (TWELVE) HOURS AS NEEDED FOR MUSCLE SPASMS. 20 tablet 0 Past Week   metoprolol tartrate (LOPRESSOR) 100 MG tablet Take 1 tablet (100 mg total) by mouth 2 (two) times daily. 180 tablet 1 11/19/2021 at AM   rosuvastatin (CRESTOR) 10 MG tablet Take 1 tablet (10 mg total) by mouth daily. (Patient taking differently: Take 30 mg by mouth daily.) 90 tablet 1 11/19/2021   Zoledronic Acid (RECLAST IV) Inject into the vein. Once yearly   2022   nitroGLYCERIN (NITROSTAT) 0.4 MG SL tablet PLACE 1 TABLET UNDER THE TONGUE EVERY 5 MINUTES AS NEEDED FOR CHEST PAIN. (Patient taking differently: Place 0.4 mg under the tongue every 5 (five) minutes as needed for chest pain.) 25 tablet 2 UNK    Results for orders placed or performed during the hospital encounter of 11/19/21 (from the past 48 hour(s))  Basic metabolic panel     Status: Abnormal   Collection Time: 11/20/21 12:03 AM  Result Value Ref Range   Sodium 139 135 - 145 mmol/L   Potassium 3.4 (L) 3.5 - 5.1 mmol/L   Chloride 108 98 - 111 mmol/L   CO2 22 22 - 32 mmol/L   Glucose, Bld 95 70 - 99 mg/dL    Comment: Glucose reference range applies only to samples taken after fasting for at least 8 hours.   BUN 11 8 - 23 mg/dL   Creatinine, Ser 0.53 0.44 - 1.00 mg/dL   Calcium 8.6 (L) 8.9 - 10.3 mg/dL   GFR, Estimated >60 >60 mL/min    Comment: (NOTE) Calculated using the CKD-EPI Creatinine Equation (2021)    Anion gap 9 5 - 15    Comment: Performed at [facility], [address], [place], [place] [address]  CBC with Differential     Status: None   Collection Time: 11/20/21 12:03 AM  Result Value Ref Range   WBC 7.9 4.0 -  10.5 K/uL   RBC 4.20 3.87 - 5.11 MIL/uL   Hemoglobin 12.8 12.0 - 15.0 g/dL   HCT 38.7 36.0 - 46.0 %   MCV 92.1 80.0 - 100.0 fL   MCH 30.5 26.0 - 34.0 pg   MCHC 33.1 30.0 - 36.0 g/dL   RDW 15.0 11.5 - 15.5 %   Platelets 210 150 - 400 K/uL   nRBC 0.0 0.0 - 0.2 %   Neutrophils Relative % 57 %   Neutro Abs 4.4  1.7 - 7.7 K/uL   Lymphocytes Relative 32 %   Lymphs Abs 2.5 0.7 - 4.0 K/uL   Monocytes Relative 11 %   Monocytes Absolute 0.9 0.1 - 1.0 K/uL   Eosinophils Relative 0 %   Eosinophils Absolute 0.0 0.0 - 0.5 K/uL   Basophils Relative 0 %   Basophils Absolute 0.0 0.0 - 0.1 K/uL   Immature Granulocytes 0 %   Abs Immature Granulocytes 0.03 0.00 - 0.07 K/uL    Comment: Performed at [facility], [address] [place], [place] [address]  Comprehensive metabolic panel     Status: Abnormal   Collection Time: 11/20/21  6:02 AM  Result Value Ref Range   Sodium 139 135 - 145 mmol/L   Potassium 3.2 (L) 3.5 - 5.1 mmol/L   Chloride 108 98 - 111 mmol/L   CO2 22 22 - 32 mmol/L   Glucose, Bld 93 70 - 99 mg/dL    Comment: Glucose reference range applies only to samples taken after fasting for at least 8 hours.   BUN 8 8 - 23 mg/dL   Creatinine, Ser 1.76 0.44 - 1.00 mg/dL   Calcium 8.1 (L) 8.9 - 10.3 mg/dL   Total Protein 6.5 6.5 - 8.1 g/dL   Albumin 3.2 (L) 3.5 - 5.0 g/dL   AST 11 (L) 15 - 41 U/L   ALT 9 0 - 44 U/L   Alkaline Phosphatase 53 38 - 126 U/L   Total Bilirubin 0.8 0.3 - 1.2 mg/dL   GFR, Estimated >16 >07 mL/min    Comment: (NOTE) Calculated using the CKD-EPI Creatinine Equation (2021)    Anion gap 9 5 - 15    Comment: Performed at [facility], [address] [place], [place] [address]  Magnesium     Status: None   Collection Time: 11/20/21  6:02 AM  Result Value Ref Range   Magnesium 1.9 1.7 - 2.4 mg/dL    Comment: Performed at [facility], [address], [place], [place] [address]  CBC with Differential/Platelet     Status: None   Collection Time: 11/20/21  6:02 AM   Result Value Ref Range   WBC 7.4 4.0 - 10.5 K/uL   RBC 4.28 3.87 - 5.11 MIL/uL   Hemoglobin 13.0 12.0 - 15.0 g/dL   HCT 54.6 27.0 - 35.0 %   MCV 93.9 80.0 - 100.0 fL   MCH 30.4 26.0 - 34.0 pg   MCHC 32.3 30.0 - 36.0 g/dL   RDW 09.3 81.8 - 29.9 %   Platelets 212 150 - 400 K/uL   nRBC 0.0 0.0 - 0.2 %   Neutrophils Relative % 50 %   Neutro Abs 3.7 1.7 - 7.7 K/uL   Lymphocytes Relative 36 %   Lymphs Abs 2.7 0.7 - 4.0 K/uL   Monocytes Relative 13 %   Monocytes Absolute 0.9 0.1 - 1.0 K/uL   Eosinophils Relative 1 %   Eosinophils Absolute 0.0 0.0 - 0.5 K/uL   Basophils Relative 0 %   Basophils Absolute 0.0 0.0 - 0.1 K/uL   Immature Granulocytes 0 %   Abs Immature Granulocytes 0.02 0.00 - 0.07 K/uL    Comment: Performed at [facility], [address] [place], [place] [address]  TSH     Status: Abnormal   Collection Time: 11/20/21  6:02 AM  Result Value Ref Range   TSH 17.071 (H) 0.350 - 4.500 uIU/mL    Comment: Performed by a 3rd Generation assay with a functional sensitivity  of <=0.01 uIU/mL. Performed at [facility], [address], [place], [place] [address]   T4, free     Status: None   Collection Time: 11/20/21  7:44 AM  Result Value Ref Range   Free T4 0.78 0.61 - 1.12 ng/dL    Comment: (NOTE) Biotin ingestion may interfere with free T4 tests. If the results are inconsistent with the TSH level, previous test results, or the clinical presentation, then consider biotin interference. If needed, order repeat testing after stopping biotin. Performed at [facility], [place] [address] [place], [place] [address]   VITAMIN D 25 Hydroxy (Vit-D Deficiency, Fractures)     Status: Abnormal   Collection Time: 11/20/21  7:49 AM  Result Value Ref Range   Vit D, 25-Hydroxy 7.75 (L) 30 - 100 ng/mL    Comment: (NOTE) Vitamin D deficiency has been defined by the [place] practice guideline as a level of serum 25-OH  vitamin D less than 20  ng/mL (1,2). The Endocrine Society went on to  further define vitamin D insufficiency as a level between 21 and 29  ng/mL (2).  1. IOM (Institute of Medicine). 2010. Dietary reference intakes for  calcium and D. [place]: The [employer]. 2. Holick MF, Binkley Odell, Bischoff-Ferrari HA, et al. Evaluation,  treatment, and prevention of vitamin D deficiency: an Endocrine  Society clinical practice guideline, JCEM. 2011 Jul; 96(7): 1911-30.  Performed at [facility], [place] [address], [place], [place] [address]     CT Lumbar Spine Wo Contrast  Result Date: 11/20/2021 CLINICAL DATA:  Low back pain with recent fracture EXAM: CT LUMBAR SPINE WITHOUT CONTRAST TECHNIQUE: Multidetector CT imaging of the lumbar spine was performed without intravenous contrast administration. Multiplanar CT image reconstructions were also generated. RADIATION DOSE REDUCTION: This exam was performed according to the departmental dose-optimization program which includes automated exposure control, adjustment of the mA and/or kV according to patient size and/or use of iterative reconstruction technique. COMPARISON:  10/14/2021 FINDINGS: Segmentation: Partial lumbarization of S1. Numbering is unchanged from prior study. Alignment: Grade 1 anterolisthesis at L4-5 Vertebrae: Progressive height loss at the fractured L1 level, now approximately 50%. There is now 3 mm of retropulsion of the posterosuperior corner. No new fracture site. Paraspinal and other soft tissues: Calcific aortic atherosclerosis. Disc levels: Multilevel degenerative disc disease without high-grade spinal canal stenosis. Aside from the L1 level, the appearance is unchanged. IMPRESSION: 1. Progressive height loss at the fractured L1 level, now approximately 50%. 3 mm of retropulsion of the posterosuperior corner is new; but there is no spinal canal stenosis. 2. No new fracture site. 3. Aortic Atherosclerosis (ICD10-I70.0). Electronically Signed    By: Ulyses Jarred M.D.   On: 11/20/2021 01:28    Review of Systems  Musculoskeletal:  Positive for back pain and gait problem.  Neurological:  Positive for weakness.  All other systems reviewed and are negative.  Blood pressure (!) 103/58, pulse (!) 55, temperature (!) 97.3 F (36.3 C), temperature source Oral, resp. rate 18, height 5\' 2"  (1.575 m), weight 51.4 kg, SpO2 95 %. Physical Exam Constitutional:      Appearance: Normal appearance. She is normal weight.  HENT:     Head: Normocephalic and atraumatic.     Right Ear: Tympanic membrane, ear canal and external ear normal.     Left Ear: Tympanic membrane, ear canal and external ear normal.     Nose: Nose normal.  Mouth/Throat:     Mouth: Mucous membranes are moist.     Pharynx: Oropharynx is clear.  Eyes:     Extraocular Movements: Extraocular movements intact.     Conjunctiva/sclera: Conjunctivae normal.     Pupils: Pupils are equal, round, and reactive to light.  Cardiovascular:     Rate and Rhythm: Normal rate. Rhythm irregular.     Pulses: Normal pulses.     Heart sounds: Normal heart sounds.  Pulmonary:     Effort: Pulmonary effort is normal.     Breath sounds: Normal breath sounds.  Abdominal:     General: Abdomen is flat. Bowel sounds are normal.     Palpations: Abdomen is soft.  Musculoskeletal:        General: Normal range of motion.     Cervical back: Normal range of motion and neck supple.     Comments: Modest tenderness to percussion in the mid lumbar spine at the thoracolumbar junction  Skin:    General: Skin is warm and dry.     Capillary Refill: Capillary refill takes less than 2 seconds.  Neurological:     General: No focal deficit present.     Mental Status: She is alert and oriented to person, place, and time.  Psychiatric:        Mood and Affect: Mood normal.        Behavior: Behavior normal.        Thought Content: Thought content normal.        Judgment: Judgment normal.      Assessment/Plan: Patient with a subacute L1 compression fracture with pain that has been poorly tolerated.  She has been seen as an outpatient and advised regarding conservative management on June 20.  At that time she rated her pain at a 5 of 10.  Patient notes that the pain is never gone below that and she wishes something more definitive to be done.  I have advised that she would be a candidate for an acrylic balloon kyphoplasty of the L1 vertebrae.  Given her history of anticoagulation with Eliquis she will need to be off this medication for several days and will likely require some bridging with Lovenox or heparin as medicine would see fit.  I cannot schedule patient's kyphoplasty until Tuesday.  The patient is comfortable with this however if for the sake of expedient's the patient would like to have her procedure sooner I noted that interventional radiology could perform the procedure perhaps on Monday.  I will plan on scheduling her for Tuesday but would not at all be offended if the patient's desire was to have the procedure done sooner in which case interventional radiology may be consulted for this purpose.  Evelyn [name] 11/20/2021, 7:08 PM

## 2021-11-20 NOTE — Assessment & Plan Note (Signed)
-   Temporarily holding apixaban

## 2021-11-21 DIAGNOSIS — I2510 Atherosclerotic heart disease of native coronary artery without angina pectoris: Secondary | ICD-10-CM | POA: Diagnosis not present

## 2021-11-21 DIAGNOSIS — S32010S Wedge compression fracture of first lumbar vertebra, sequela: Secondary | ICD-10-CM | POA: Diagnosis not present

## 2021-11-21 DIAGNOSIS — I4891 Unspecified atrial fibrillation: Secondary | ICD-10-CM | POA: Diagnosis not present

## 2021-11-21 DIAGNOSIS — I480 Paroxysmal atrial fibrillation: Secondary | ICD-10-CM | POA: Diagnosis not present

## 2021-11-21 LAB — BASIC METABOLIC PANEL
Anion gap: 10 (ref 5–15)
BUN: 12 mg/dL (ref 8–23)
CO2: 22 mmol/L (ref 22–32)
Calcium: 8.7 mg/dL — ABNORMAL LOW (ref 8.9–10.3)
Chloride: 107 mmol/L (ref 98–111)
Creatinine, Ser: 0.72 mg/dL (ref 0.44–1.00)
GFR, Estimated: >60 mL/min (ref >60)
Glucose, Bld: 105 mg/dL — ABNORMAL HIGH (ref 70–99)
Potassium: 3.5 mmol/L (ref 3.5–5.1)
Sodium: 139 mmol/L (ref 135–145)

## 2021-11-21 MED ORDER — ISOSORBIDE MONONITRATE ER 60 MG PO TB24
60.0000 mg | ORAL_TABLET | Freq: Every day | ORAL | Status: DC
  Administered 2021-11-21 – 2021-11-24 (×4): 60 mg via ORAL
  Filled 2021-11-21 (×4): qty 1

## 2021-11-21 NOTE — Progress Notes (Signed)
DAILY PROGRESS NOTE   Patient Name: Evelyn [name] Date of Encounter: 11/21/2021 Cardiologist: Thurmon Fair, MD  Chief Complaint   Back pain  Patient Profile   Evelyn [name] is a 81 y.o. female with a hx of CABG who is being seen 11/20/2021 for the evaluation of preop risk stratification prior to kyphoplasty at the request of Dr. Laural Benes.  Subjective   Telemetry shows sinus rhythm - Eliquis is on hold for kyphoplasty - possibly on Tuesday with Dr. Danielle Dess.  Objective   Vitals:   11/20/21 1326 11/20/21 1954 11/21/21 0014 11/21/21 0517  BP: (!) 103/58 (!) 115/58 128/72 136/61  Pulse: (!) 55 64 60 (!) 53  Resp: 18 16 16 18   Temp: (!) 97.3 F (36.3 C) (!) 97.4 F (36.3 C) (!) 97.4 F (36.3 C) 97.6 F (36.4 C)  TempSrc: Oral Oral Oral Oral  SpO2:  92% 96% 93%  Weight: 51.4 kg     Height: 5\' 2"  (1.575 m)       Intake/Output Summary (Last 24 hours) at 11/21/2021 Last data filed at 11/21/2021 0800 Gross per 24 hour  Intake 517.87 ml  Output 350 ml  Net 167.87 ml   Filed Weights   11/19/21 2045 11/20/21 1326  Weight: 49.4 kg 51.4 kg    Physical Exam   General appearance: alert and no distress Lungs: clear to auscultation bilaterally Heart: regular rate and rhythm Abdomen: soft, non-tender; bowel sounds normal; no masses,  no organomegaly Neurologic: Grossly normal  Inpatient Medications    Scheduled Meds:  acetaminophen  650 mg Oral Q6H   Or   acetaminophen  650 mg Rectal Q6H   heparin  5,000 Units Subcutaneous Q8H   isosorbide mononitrate  60 mg Oral QHS   levothyroxine  75 mcg Oral QAC breakfast   loratadine  10 mg Oral Daily   metoprolol tartrate  100 mg Oral BID   rosuvastatin  10 mg Oral Daily    Continuous Infusions:   PRN Meds: diltiazem, HYDROcodone-acetaminophen, HYDROmorphone (DILAUDID) injection, methocarbamol, ondansetron **OR** ondansetron (ZOFRAN) IV   Labs   Results for orders placed or performed during the hospital encounter  of 11/19/21 (from the past 48 hour(s))  Basic metabolic panel     Status: Abnormal   Collection Time: 11/20/21 12:03 AM  Result Value Ref Range   Sodium 139 135 - 145 mmol/L   Potassium 3.4 (L) 3.5 - 5.1 mmol/L   Chloride 108 98 - 111 mmol/L   CO2 22 22 - 32 mmol/L   Glucose, Bld 95 70 - 99 mg/dL    Comment: Glucose reference range applies only to samples taken after fasting for at least 8 hours.   BUN 11 8 - 23 mg/dL   Creatinine, Ser 01/20/22 0.44 - 1.00 mg/dL   Calcium 8.6 (L) 8.9 - 10.3 mg/dL   GFR, Estimated 01/21/22 9.32 mL/min    Comment: (NOTE) Calculated using the CKD-EPI Creatinine Equation (2021)    Anion gap 9 5 - 15    Comment: Performed at [facility], [address], [place], [address] [place]  CBC with Differential     Status: None   Collection Time: 11/20/21 12:03 AM  Result Value Ref Range   WBC 7.9 4.0 - 10.5 K/uL   RBC 4.20 3.87 - 5.11 MIL/uL   Hemoglobin 12.8 12.0 - 15.0 g/dL   HCT [address]809 01/21/22 - 98.3 %   MCV 92.1 80.0 - 100.0 fL   MCH 30.5 26.0 - 34.0  pg   MCHC 33.1 30.0 - 36.0 g/dL   RDW 97.6 73.4 - 19.3 %   Platelets 210 150 - 400 K/uL   nRBC 0.0 0.0 - 0.2 %   Neutrophils Relative % 57 %   Neutro Abs 4.4 1.7 - 7.7 K/uL   Lymphocytes Relative 32 %   Lymphs Abs 2.5 0.7 - 4.0 K/uL   Monocytes Relative 11 %   Monocytes Absolute 0.9 0.1 - 1.0 K/uL   Eosinophils Relative 0 %   Eosinophils Absolute 0.0 0.0 - 0.5 K/uL   Basophils Relative 0 %   Basophils Absolute 0.0 0.0 - 0.1 K/uL   Immature Granulocytes 0 %   Abs Immature Granulocytes 0.03 0.00 - 0.07 K/uL    Comment: Performed at [facility], [address] [place], [place] [address]  Comprehensive metabolic panel     Status: Abnormal   Collection Time: 11/20/21  6:02 AM  Result Value Ref Range   Sodium 139 135 - 145 mmol/L   Potassium 3.2 (L) 3.5 - 5.1 mmol/L   Chloride 108 98 - 111 mmol/L   CO2 22 22 - 32 mmol/L   Glucose, Bld 93 70 - 99 mg/dL    Comment: Glucose reference range applies only to samples  taken after fasting for at least 8 hours.   BUN 8 8 - 23 mg/dL   Creatinine, Ser 0.97 0.44 - 1.00 mg/dL   Calcium 8.1 (L) 8.9 - 10.3 mg/dL   Total Protein 6.5 6.5 - 8.1 g/dL   Albumin 3.2 (L) 3.5 - 5.0 g/dL   AST 11 (L) 15 - 41 U/L   ALT 9 0 - 44 U/L   Alkaline Phosphatase 53 38 - 126 U/L   Total Bilirubin 0.8 0.3 - 1.2 mg/dL   GFR, Estimated >35 >32 mL/min    Comment: (NOTE) Calculated using the CKD-EPI Creatinine Equation (2021)    Anion gap 9 5 - 15    Comment: Performed at [facility], [address], [place], [place] [address]  Magnesium     Status: None   Collection Time: 11/20/21  6:02 AM  Result Value Ref Range   Magnesium 1.9 1.7 - 2.4 mg/dL    Comment: Performed at [facility], [address], [place], [place] [address]  CBC with Differential/Platelet     Status: None   Collection Time: 11/20/21  6:02 AM  Result Value Ref Range   WBC 7.4 4.0 - 10.5 K/uL   RBC 4.28 3.87 - 5.11 MIL/uL   Hemoglobin 13.0 12.0 - 15.0 g/dL   HCT 96.2 22.9 - 79.8 %   MCV 93.9 80.0 - 100.0 fL   MCH 30.4 26.0 - 34.0 pg   MCHC 32.3 30.0 - 36.0 g/dL   RDW 92.1 19.4 - 17.4 %   Platelets 212 150 - 400 K/uL   nRBC 0.0 0.0 - 0.2 %   Neutrophils Relative % 50 %   Neutro Abs 3.7 1.7 - 7.7 K/uL   Lymphocytes Relative 36 %   Lymphs Abs 2.7 0.7 - 4.0 K/uL   Monocytes Relative 13 %   Monocytes Absolute 0.9 0.1 - 1.0 K/uL   Eosinophils Relative 1 %   Eosinophils Absolute 0.0 0.0 - 0.5 K/uL   Basophils Relative 0 %   Basophils Absolute 0.0 0.0 - 0.1 K/uL   Immature Granulocytes 0 %   Abs Immature Granulocytes 0.02 0.00 - 0.07 K/uL    Comment: Performed at [facility], [address], [place], [place]  [address]  TSH     Status: Abnormal   Collection Time: 11/20/21  6:02 AM  Result Value Ref Range   TSH 17.071 (H) 0.350 - 4.500 uIU/mL    Comment: Performed by a 3rd Generation assay with a functional sensitivity of <=0.01 uIU/mL. Performed at [facility], [address], [place], [place]  [address]   T4, free     Status: None   Collection Time: 11/20/21  7:44 AM  Result Value Ref Range   Free T4 0.78 0.61 - 1.12 ng/dL    Comment: (NOTE) Biotin ingestion may interfere with free T4 tests. If the results are inconsistent with the TSH level, previous test results, or the clinical presentation, then consider biotin interference. If needed, order repeat testing after stopping biotin. Performed at [facility], [address] [place], [place] [address]   VITAMIN D 25 Hydroxy (Vit-D Deficiency, Fractures)     Status: Abnormal   Collection Time: 11/20/21  7:49 AM  Result Value Ref Range   Vit D, 25-Hydroxy 7.75 (L) 30 - 100 ng/mL    Comment: (NOTE) Vitamin D deficiency has been defined by the Institute of Medicine  and an Endocrine Society practice guideline as a level of serum 25-OH  vitamin D less than 20 ng/mL (1,2). The Endocrine Society went on to  further define vitamin D insufficiency as a level between 21 and 29  ng/mL (2).  1. IOM (Institute of Medicine). 2010. Dietary reference intakes for  calcium and D. [place]: [employer]. 2. Holick MF, Binkley Lorraine, Bischoff-Ferrari HA, et al. Evaluation,  treatment, and prevention of vitamin D deficiency: an Endocrine  Society clinical practice guideline, JCEM. 2011 Jul; 96(7): 1911-30.  Performed at [facility], [address] [place], [place] [address]   Basic metabolic panel     Status: Abnormal   Collection Time: 11/21/21  2:24 AM  Result Value Ref Range   Sodium 139 135 - 145 mmol/L   Potassium 3.5 3.5 - 5.1 mmol/L   Chloride 107 98 - 111 mmol/L   CO2 22 22 - 32 mmol/L   Glucose, Bld 105 (H) 70 - 99 mg/dL    Comment: Glucose reference range applies only to samples taken after fasting for at least 8 hours.   BUN 12 8 - 23 mg/dL   Creatinine, Ser 6.73 0.44 - 1.00 mg/dL   Calcium 8.7 (L) 8.9 - 10.3 mg/dL   GFR, Estimated >41 >93 mL/min    Comment: (NOTE) Calculated using the  CKD-EPI Creatinine Equation (2021)    Anion gap 10 5 - 15    Comment: Performed at [facility], [address], [place], [place] [address]    ECG   N/A  Telemetry   Sinus rhythm - Personally Reviewed  Radiology    CT Lumbar Spine Wo Contrast  Result Date: 11/20/2021 CLINICAL DATA:  Low back pain with recent fracture EXAM: CT LUMBAR SPINE WITHOUT CONTRAST TECHNIQUE: Multidetector CT imaging of the lumbar spine was performed without intravenous contrast administration. Multiplanar CT image reconstructions were also generated. RADIATION DOSE REDUCTION: This exam was performed according to the departmental dose-optimization program which includes automated exposure control, adjustment of the mA and/or kV according to patient size and/or use of iterative reconstruction technique. COMPARISON:  10/14/2021 FINDINGS: Segmentation: Partial lumbarization of S1. Numbering is unchanged from prior study. Alignment: Grade 1 anterolisthesis at L4-5 Vertebrae: Progressive height loss at the fractured L1 level, now approximately 50%. There  is now 3 mm of retropulsion of the posterosuperior corner. No new fracture site. Paraspinal and other soft tissues: Calcific aortic atherosclerosis. Disc levels: Multilevel degenerative disc disease without high-grade spinal canal stenosis. Aside from the L1 level, the appearance is unchanged. IMPRESSION: 1. Progressive height loss at the fractured L1 level, now approximately 50%. 3 mm of retropulsion of the posterosuperior corner is new; but there is no spinal canal stenosis. 2. No new fracture site. 3. Aortic Atherosclerosis (ICD10-I70.0). Electronically Signed   By: Deatra Robinson M.D.   On: 11/20/2021 01:28    Cardiac Studies   N/A  Assessment   Principal Problem:   Atrial fibrillation with RVR [facility]) Active Problems:   Abnormal nuclear cardiac imaging test   CAD, PCI '02, '04. 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 Felt to be at acceptable risk for kyphoplasty. Will be available as needed over the weekend if she has any issues with recurrent afib.  Time Spent Directly with Patient:  I have spent a total of 25 minutes with the patient reviewing hospital notes, telemetry, EKGs, labs and examining the patient as well as establishing an assessment and plan that was discussed personally with the patient.  > 50% of time was spent in direct patient care.  Length of Stay:  LOS: 1 day   Chrystie Nose, MD, [facility], FACP  [place]  [facility]  Medical Director of the Advanced Lipid Disorders &  Cardiovascular Risk Reduction Clinic Diplomate of the American Board of Clinical Lipidology Attending Cardiologist  Direct Dial: [phone]  Fax: [phone]  Website:  [name] 11/21/2021, 9:23 AM

## 2021-11-21 NOTE — Progress Notes (Signed)
PROGRESS NOTE    Evelyn [name]  [id number] DOB: 08-24-1940 DOA: 11/19/2021 PCP: Chevis Pretty, FNP     Brief Narrative:  81 y/o female with paroxysmal atrial fibrillation chronically anticoagulated with apixaban, longstanding CAD status post stent placement, status post bypass surgery LIMA to LAD, SVG to diagonal and SVG to OM in 2004 and hyperlipidemia, chronic pain chronic cervical disc disease, prior back and neck surgeries, chronic pain, hypothyroidism, hypertension, osteoporosis who presented to the emergency department with acute uncontrolled back pain.  She had been seen at [facility], ED on 10/14/2021 and diagnosed with an L1 compression fracture.  She was placed in a TLSO brace and given pain management and discharged home and followed up with the [place] neurosurgery clinic.  She has been taking hydrocodone at home but no relief in pain.  She apparently attempted to get out of bed and walk and the pain became so severe she could not stand or move and called EMS to bring her to the emergency department.  CT scan reveals worsening L1 compression fracture.  This was discussed with neurosurgeon Dr. Ellene Route who recommended that patient be admitted to the hospitalist service at [facility] and neurosurgery will consult for consideration of kyphoplasty if patient is cleared by cardiology for the procedure.  The plan would be for her to have the procedure on Monday if cleared and requested that apixaban be held.  She was noted to be in atrial fibrillation with RVR and briefly was placed on a Cardizem infusion and after rate has been controlled she is now back on oral metoprolol 100 mg twice daily which she takes normally.  She will be admitted to [facility] service at [facility].   Subjective:  Sitting up in bed, reports no back pain at rest, but pain with activity, her daughter is going to bring her back brace to the hospital She is in sinus rhythm, blood pressure stable She denies chest  pain, no short of breath  Assessment & Plan:  Principal Problem:   Atrial fibrillation with RVR [facility]) Active Problems:   Abnormal nuclear cardiac imaging test   Compression fracture of L1 vertebra (HCC)   Hypothyroidism   CAD, PCI '02, '04. 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)   Pain management   DDD (degenerative disc disease), cervical   Mixed hyperlipidemia   Long term (current) use of anticoagulants   Paroxysmal atrial fibrillation (HCC)   Cervical radiculopathy at C5   Intractable back pain   Acquired thrombophilia (HCC)   Failure to thrive in adult   Hypokalemia    Assessment and Plan:  * Atrial fibrillation with RVR (HCC)/Acquired thrombophilia ([employer]) - briefly on IV diltiazem infusion in the ED  -now sinus rhythm , continue current regimen , appreciate cardiology input  -Temporarily holding apixaban  Compression fracture of L1 vertebra ([place]) - Patient has failed conservative management at home  - IV pain management, continue TLSO brace -cleared by cardiology -Kyphoplasty on Monday by IR or on Tuesday by Dr. Ellene Route. 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   Essential hypertension - Resume home metoprolol 100 mg twice daily follow closely  Mixed hyperlipidemia - Resume home statin therapy   Hypothyroidism - continue home levothyroxine daily - TSH markedly elevated greater than 17 , free T4 wnl  DDD (degenerative disc disease), cervical - Currently stable no  acute symptoms  Failure to thrive in adult - Secondary to uncontrolled pain and difficulty ambulating due to worsening L1 compression fracture   I have Reviewed nursing notes, Vitals, pain scores, I/o's, Lab results and  imaging results since pt's last encounter, details please see discussion above  I ordered the following labs:  Unresulted Labs (From admission, onward)     Start     Ordered   11/22/21 0500  Basic metabolic panel  Daily,   R     Question:  Specimen collection method  Answer:  Lab=Lab collect   11/21/21 2228             DVT prophylaxis: heparin injection 5,000 Units Start: 11/20/21 0600 SCDs Start: 11/20/21 0531   Code Status:   Code Status: Full Code  Family Communication: Patient Disposition:    Dispo: The patient is from: Home              Anticipated d/c is to: Home              Anticipated d/c date is: Kyphoplasty planned on Tuesday  Antimicrobials:    Anti-infectives (From admission, onward)    None          Objective: Vitals:   11/21/21 0014 11/21/21 0517 11/21/21 0800 11/21/21 2000  BP: 128/72 136/61  (!) 144/94  Pulse: 60 (!) 53 (!) 56 63  Resp: 16 18  18   Temp: (!) 97.4 F (36.3 C) 97.6 F (36.4 C)  (!) 97.5 F (36.4 C)  TempSrc: Oral Oral  Oral  SpO2: 96% 93% 95% 96%  Weight:      Height:        Intake/Output Summary (Last 24 hours) at 11/21/2021 2228 Last data filed at 11/21/2021 0800 Gross per 24 hour  Intake 240 ml  Output 350 ml  Net -110 ml   Filed Weights   11/19/21 2045 11/20/21 1326  Weight: 49.4 kg 51.4 kg    Examination:  General exam: Very pleasant, NAD Respiratory system: Clear to auscultation. Respiratory effort normal. Cardiovascular system:  RRR.  Gastrointestinal system: Abdomen is nondistended, soft and nontender.  Normal bowel sounds heard. Central nervous system: Alert and oriented. No focal neurological deficits. Extremities:  no edema Skin: No rashes, lesions or ulcers Psychiatry:  Judgement and insight appear normal. Mood & affect appropriate.     Data Reviewed: I have personally reviewed  labs and visualized  imaging studies since the last encounter and formulate the plan        Scheduled Meds:  acetaminophen  650 mg Oral Q6H   Or   acetaminophen  650 mg Rectal Q6H   heparin  5,000 Units Subcutaneous Q8H   isosorbide mononitrate  60 mg Oral QHS   levothyroxine  75 mcg Oral QAC breakfast   loratadine  10 mg Oral Daily   metoprolol tartrate  100 mg Oral BID   rosuvastatin  10 mg Oral Daily   Continuous Infusions:   LOS: 1 day     01/21/22, MD PhD FACP [facility]  Available via Epic secure chat 7am-7pm for nonurgent issues Please page for urgent issues To  page the attending provider between 7A-7P or the covering provider during after hours 7P-7A, please log into the web site [email or web address] and access using universal [place] password for that web site. If you do not have the password, please call the hospital operator.    11/21/2021, 10:28 PM

## 2021-11-21 NOTE — Plan of Care (Signed)
°  Problem: Clinical Measurements: °Goal: Ability to maintain clinical measurements within normal limits will improve °Outcome: Progressing °  °Problem: Activity: °Goal: Risk for activity intolerance will decrease °Outcome: Progressing °  °Problem: Nutrition: °Goal: Adequate nutrition will be maintained °Outcome: Progressing °  °

## 2021-11-22 DIAGNOSIS — S32010S Wedge compression fracture of first lumbar vertebra, sequela: Secondary | ICD-10-CM

## 2021-11-22 DIAGNOSIS — I2510 Atherosclerotic heart disease of native coronary artery without angina pectoris: Secondary | ICD-10-CM | POA: Diagnosis not present

## 2021-11-22 DIAGNOSIS — I480 Paroxysmal atrial fibrillation: Secondary | ICD-10-CM

## 2021-11-22 LAB — BASIC METABOLIC PANEL
Anion gap: 9 (ref 5–15)
BUN: 10 mg/dL (ref 8–23)
CO2: 23 mmol/L (ref 22–32)
Calcium: 8.6 mg/dL — ABNORMAL LOW (ref 8.9–10.3)
Chloride: 108 mmol/L (ref 98–111)
Creatinine, Ser: 0.69 mg/dL (ref 0.44–1.00)
GFR, Estimated: >60 mL/min (ref >60)
Glucose, Bld: 106 mg/dL — ABNORMAL HIGH (ref 70–99)
Potassium: 3.3 mmol/L — ABNORMAL LOW (ref 3.5–5.1)
Sodium: 140 mmol/L (ref 135–145)

## 2021-11-22 MED ORDER — VITAMIN D (ERGOCALCIFEROL) 1.25 MG (50000 UNIT) PO CAPS
50000.0000 [IU] | ORAL_CAPSULE | ORAL | Status: DC
  Administered 2021-11-23: 50000 [IU] via ORAL
  Filled 2021-11-22: qty 1

## 2021-11-22 MED ORDER — POTASSIUM CHLORIDE CRYS ER 20 MEQ PO TBCR
40.0000 meq | EXTENDED_RELEASE_TABLET | Freq: Every day | ORAL | Status: DC
  Administered 2021-11-23 – 2021-11-25 (×3): 40 meq via ORAL
  Filled 2021-11-22 (×3): qty 2

## 2021-11-22 NOTE — Progress Notes (Signed)
PROGRESS NOTE    Evelyn [name]  JIR:[id number] DOB: Aug 09, 1940 DOA: 11/19/2021 PCP: Bennie Pierini, FNP     Brief Narrative:  81 y/o female with paroxysmal atrial fibrillation chronically anticoagulated with apixaban, longstanding CAD status post stent placement, status post bypass surgery LIMA to LAD, SVG to diagonal and SVG to OM in 2004 and hyperlipidemia, chronic pain chronic cervical disc disease, prior back and neck surgeries, chronic pain, hypothyroidism, hypertension, osteoporosis who presented to the emergency department with acute uncontrolled back pain.  She had been seen at [facility], ED on 10/14/2021 and diagnosed with an L1 compression fracture.  She was placed in a TLSO brace and given pain management and discharged home and followed up with the [place] neurosurgery clinic.  She has been taking hydrocodone at home but no relief in pain.  She apparently attempted to get out of bed and walk and the pain became so severe she could not stand or move and called EMS to bring her to the emergency department.  CT scan reveals worsening L1 compression fracture.  This was discussed with neurosurgeon Dr. Danielle Dess who recommended that patient be admitted to the hospitalist service at [facility] and neurosurgery will consult for consideration of kyphoplasty if patient is cleared by cardiology for the procedure.  The plan would be for her to have the procedure on Monday if cleared and requested that apixaban be held.  She was noted to be in atrial fibrillation with RVR and briefly was placed on a Cardizem infusion and after rate has been controlled she is now back on oral metoprolol 100 mg twice daily which she takes normally.  She will be admitted to [facility] service at [facility].   Subjective: No acute interval changes Sitting up in bed, reports no back pain at rest, but pain with activity,  her daughter is going to bring her back brace to the hospital today She is in sinus rhythm, blood  pressure stable She denies chest pain, no short of breath She desires Dr Danielle Dess to do kyphoplasty   Assessment & Plan:  Principal Problem:   Atrial fibrillation with RVR [facility]) Active Problems:   Abnormal nuclear cardiac imaging test   Compression fracture of L1 vertebra (HCC)   Hypothyroidism   CAD, PCI '02, '04. 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)   Pain management   DDD (degenerative disc disease), cervical   Mixed hyperlipidemia   Long term (current) use of anticoagulants   Paroxysmal atrial fibrillation (HCC)   Cervical radiculopathy at C5   Intractable back pain   Acquired thrombophilia (HCC)   Failure to thrive in adult   Hypokalemia    Assessment and Plan:  * Atrial fibrillation with RVR (HCC)/Acquired thrombophilia (HCC) - briefly on IV diltiazem infusion in the ED  -now sinus rhythm , continue current regimen , appreciate cardiology input  -Temporarily holding apixaban  Compression fracture of L1 vertebra (HCC) - Patient has failed conservative management at home  - IV pain management, continue TLSO brace -cleared by cardiology -she prefers to have Kyphoplasty  on Tuesday by Dr. Danielle Dess. -Hold Eliquis  Vitamin D level deficiency: start vit D supplement   CAD, PCI '02, '04. CABG X 3 2004 Abnormal nuclear cardiac imaging test - Resume all home cardiac medications with the exception of apixaban which is temporarily being held for possible procedure 11/23/2021 - cardiology consulted for preoperative evaluation -cleared  by cardiology   Hypokalemia -remain low, continue to replace  recheck BMP in a.m.   Essential hypertension - bp stable on currently regimen   Mixed hyperlipidemia - Resume home statin therapy   Hypothyroidism - continue home levothyroxine daily - TSH markedly elevated greater than 17 , free T4 wnl  DDD (degenerative disc disease),  cervical - Currently stable no acute symptoms  Failure to thrive in adult - Secondary to uncontrolled pain and difficulty ambulating due to worsening L1 compression fracture   I have Reviewed nursing notes, Vitals, pain scores, I/o's, Lab results and  imaging results since pt's last encounter, details please see discussion above  I ordered the following labs:  Unresulted Labs (From admission, onward)     Start     Ordered   11/22/21 [id number]  Basic metabolic panel  Daily,   R     Question:  Specimen collection method  Answer:  Lab=Lab collect   11/21/21 2228             DVT prophylaxis: heparin injection 5,000 Units Start: 11/20/21 0600 SCDs Start: 11/20/21 0531   Code Status:   Code Status: Full Code  Family Communication: Patient Disposition:    Dispo: The patient is from: Home              Anticipated d/c is to: Home              Anticipated d/c date is: Kyphoplasty planned on Tuesday  Antimicrobials:    Anti-infectives (From admission, onward)    None          Objective: Vitals:   11/21/21 2000 11/22/21 0559 11/22/21 0830 11/22/21 1025  BP: (!) 144/94 (!) 142/71  (!) 145/74  Pulse: 63 (!) 54 (!) 51 65  Resp: 18 18  18   Temp: (!) 97.5 F (36.4 C) (!) 97.5 F (36.4 C)  (!) 97.4 F (36.3 C)  TempSrc: Oral Oral  Axillary  SpO2: 96% 97% 93%   Weight:      Height:       No intake or output data in the 24 hours ending 11/22/21 1321  Filed Weights   11/19/21 2045 11/20/21 1326  Weight: 49.4 kg 51.4 kg    Examination:  General exam: Very pleasant, NAD Respiratory system: Clear to auscultation. Respiratory effort normal. Cardiovascular system:  RRR.  Gastrointestinal system: Abdomen is nondistended, soft and nontender.  Normal bowel sounds heard. Central nervous system: Alert and oriented. No focal neurological deficits. Extremities:  no edema Skin: No rashes, lesions or ulcers Psychiatry: Judgement and insight appear normal. Mood & affect  appropriate.     Data Reviewed: I have personally reviewed  labs and visualized  imaging studies since the last encounter and formulate the plan        Scheduled Meds:  acetaminophen  650 mg Oral Q6H   Or   acetaminophen  650 mg Rectal Q6H   heparin  5,000 Units Subcutaneous Q8H   isosorbide mononitrate  60 mg Oral QHS   levothyroxine  75 mcg Oral QAC breakfast   loratadine  10 mg Oral Daily   metoprolol tartrate  100 mg Oral BID   rosuvastatin  10 mg Oral Daily   Continuous Infusions:   LOS: 2 days     Florencia Reasons, MD PhD FACP [facility]  Available via Epic secure chat 7am-7pm for nonurgent issues Please page for urgent issues To page the attending provider between 7A-7P or the covering provider during after hours  7P-7A, please log into the web site [email or web address] and access using universal [place] password for that web site. If you do not have the password, please call the hospital operator.    11/22/2021, 1:21 PM

## 2021-11-22 NOTE — Plan of Care (Signed)

## 2021-11-23 ENCOUNTER — Other Ambulatory Visit: Payer: Self-pay | Admitting: Neurological Surgery

## 2021-11-23 DIAGNOSIS — I4891 Unspecified atrial fibrillation: Secondary | ICD-10-CM | POA: Diagnosis not present

## 2021-11-23 LAB — BASIC METABOLIC PANEL
Anion gap: 8 (ref 5–15)
BUN: 12 mg/dL (ref 8–23)
CO2: 26 mmol/L (ref 22–32)
Calcium: 8.6 mg/dL — ABNORMAL LOW (ref 8.9–10.3)
Chloride: 107 mmol/L (ref 98–111)
Creatinine, Ser: 0.69 mg/dL (ref 0.44–1.00)
GFR, Estimated: >60 mL/min (ref >60)
Glucose, Bld: 101 mg/dL — ABNORMAL HIGH (ref 70–99)
Potassium: 3.7 mmol/L (ref 3.5–5.1)
Sodium: 141 mmol/L (ref 135–145)

## 2021-11-23 LAB — MAGNESIUM: Magnesium: 1.8 mg/dL (ref 1.7–2.4)

## 2021-11-23 NOTE — Progress Notes (Signed)
PROGRESS NOTE  Evelyn [name] [id number] DOB: 06/27/1940 DOA: 11/19/2021 PCP: Chevis Pretty, FNP  HPI/Recap of past 24 hours: Evelyn [name] is a 81 y/o female with paroxysmal atrial fibrillation on Eliquis, coronary artery disease status post PCI with stenting, status post CABG, hyperlipidemia, chronic pain syndrome, cervical disc disease, prior back and neck surgeries, hypothyroidism, hypertension, osteoporosis, who presented to the ED with intractable back pain.  Previously seen at [facility], ED on 10/14/2021 and diagnosed with L1 compression fracture.  Was placed on TLSO brace, given pain medication and discharged home to follow-up with [facility] neurosurgery clinic.  Has been taking narcotics at home without improvement.  The pain was so severe that she could not walk.  EMS was activated.  She returned to the ED, CT scan revealed worsening L1 compression fracture.  Neurosurgery Dr. Ellene Route was consulted and recommended clearance by cardiology prior to kyphoplasty.  On Eliquis held in anticipation for neurosurgical procedure.  While in the ED she was noted to be in A-fib with RVR and was started on Cardizem drip.  Now off the Cardizem drip and rate controlled on p.o. Lopressor 100 mg twice daily.  Admitted under the care of the hospitalist service at [facility].  Back in sinus rhythm.  Eliquis on hold for kyphoplasty.  Cleared by cardiology.  Anticipated date of kyphoplasty 11/24/2021.  11/23/2021:  Seen and examined at her bedside.  She has no new complaints today.  Plan kyphoplasty tomorrow, n.p.o. after midnight.     Assessment/Plan: Principal Problem:   Atrial fibrillation with RVR (HCC) Active Problems:   Abnormal nuclear cardiac imaging test   Compression fracture of L1 vertebra (HCC)   Hypothyroidism   CAD, PCI '02, '04. 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 Currently rate controlled with p.o. Lopressor 100 mg twice daily. Home Eliquis on hold in anticipation for kyphoplasty on 11/24/2021.   Compression fracture of L1 vertebra [facility]) - Patient has failed conservative management at home  - IV pain management, continue TLSO brace for now - Plan kyphoplasty on 11/24/2021-has been cleared by cardiology for the procedure. -Continue to hold off home Eliquis, restart DOAC when okay with neurosurgery.   Vitamin D level deficiency: Continue vit D supplement, 50,000 unit every 7 days.   CAD, PCI '02, '04. CABG X 3 2004 Abnormal nuclear cardiac imaging test - Resume all home cardiac medications with the exception of apixaban which is temporarily being held for possible procedure 11/24/2021 Cleared by cardiology for kyphoplasty.   Resolved hypokalemia Replete electrolytes as indicated.    Essential hypertension, BP is not at goal, elevated. Continue Lopressor 100 mg twice daily Continue to closely monitor vital signs   Mixed hyperlipidemia Continue home Crestor.   Hypothyroidism - Continue home levothyroxine daily - TSH markedly elevated greater than 17 , free T4 wnl, 0.78.   DDD (degenerative disc disease), cervical - Currently stable no acute symptoms   Failure to thrive in adult -  Secondary to uncontrolled pain and difficulty ambulating due to worsening L1 compression fracture BMI 20 Albumin 3.2 from 11/20/2021 Encourage increase in oral protein calorie intake      DVT prophylaxis: heparin injection 5,000  Units Start: 11/20/21 0600 SCDs Start: 11/20/21 0531     Code Status:   Code Status: Full Code   Family Communication: None at bedside. Disposition:      Dispo: The patient is from: Home              Anticipated d/c is to: Home              Anticipated d/c date is: After kyphoplasty planned on 11/24/2021.   Antimicrobials:       Status is: Inpatient The patient requires at least 2 midnights for further evaluation and treatment of present condition.    Objective: Vitals:   11/22/21 1342 11/22/21 2006 11/23/21 0450 11/23/21 0845  BP: 129/70 (!) 144/91 136/68 (!) 153/84  Pulse:  61 (!) 59 62  Resp: 17 18 19    Temp: (!) 97.4 F (36.3 C) 97.6 F (36.4 C) (!) 97.4 F (36.3 C)   TempSrc: Oral Oral Oral   SpO2:  96% 91%   Weight:      Height:       No intake or output data in the 24 hours ending 11/23/21 0921 Filed Weights   11/19/21 2045 11/20/21 1326  Weight: 49.4 kg 51.4 kg    Exam:  General: 81 y.o. year-old female well developed well nourished in no acute distress.  Alert and oriented x3. Cardiovascular: Regular rate and rhythm with no rubs or gallops.  No thyromegaly or JVD noted.   Respiratory: Clear to auscultation with no wheezes or rales. Good inspiratory effort. Abdomen: Soft nontender nondistended with normal bowel sounds x4 quadrants. Musculoskeletal: No lower extremity edema. 2/4 pulses in all 4 extremities. Skin: No ulcerative lesions noted or rashes, Psychiatry: Mood is appropriate for condition and setting   Data Reviewed: CBC: Recent Labs  Lab 11/20/21 0003 11/20/21 0602  WBC 7.9 7.4  NEUTROABS 4.4 3.7  HGB 12.8 13.0  HCT 38.7 40.2  MCV 92.1 93.9  PLT 210 212   Basic Metabolic Panel: Recent Labs  Lab 11/20/21 0003 11/20/21 0602 11/21/21 0224 11/22/21 0108 11/23/21 0135  NA 139 139 139 140 141  K 3.4* 3.2* 3.5 3.3* 3.7  CL 108 108 107 108 107  CO2 22 22 22 23 26   GLUCOSE 95 93 105* 106* 101*  BUN 11 8 12 10 12   CREATININE 0.53  0.46 0.72 0.69 0.69  CALCIUM 8.6* 8.1* 8.7* 8.6* 8.6*  MG  --  1.9  --   --  1.8   GFR: Estimated Creatinine Clearance: 42.1 mL/min (by C-G formula based on SCr of 0.69 mg/dL). Liver Function Tests: Recent Labs  Lab 11/20/21 0602  AST 11*  ALT 9  ALKPHOS 53  BILITOT 0.8  PROT 6.5  ALBUMIN 3.2*   No results for input(s): "LIPASE", "AMYLASE" in the last 168 hours. No results for input(s): "AMMONIA" in the last 168 hours. Coagulation Profile: No results for input(s): "INR", "PROTIME" in the last 168 hours. Cardiac Enzymes: No results for input(s): "CKTOTAL", "CKMB", "CKMBINDEX", "TROPONINI" in the last 168 hours. BNP (last 3 results) No results for input(s): "PROBNP" in the last 8760 hours. HbA1C: No results for input(s): "HGBA1C" in the last 72 hours. CBG: No results for input(s): "GLUCAP" in the last 168 hours. Lipid Profile: No results for input(s): "  CHOL", "HDL", "LDLCALC", "TRIG", "CHOLHDL", "LDLDIRECT" in the last 72 hours. Thyroid Function Tests: No results for input(s): "TSH", "T4TOTAL", "FREET4", "T3FREE", "THYROIDAB" in the last 72 hours. Anemia Panel: No results for input(s): "VITAMINB12", "FOLATE", "FERRITIN", "TIBC", "IRON", "RETICCTPCT" in the last 72 hours. Urine analysis:    Component Value Date/Time   COLORURINE YELLOW 09/01/2016 0849   APPEARANCEUR Clear 06/25/2020 1629   LABSPEC <1.005 (L) 09/01/2016 0849   PHURINE 5.5 09/01/2016 0849   GLUCOSEU Negative 06/25/2020 1629   HGBUR SMALL (A) 09/01/2016 0849   BILIRUBINUR Negative 06/25/2020 1629   KETONESUR NEGATIVE 09/01/2016 0849   PROTEINUR Negative 06/25/2020 1629   PROTEINUR NEGATIVE 09/01/2016 0849   NITRITE Negative 06/25/2020 1629   NITRITE NEGATIVE 09/01/2016 0849   LEUKOCYTESUR 1+ (A) 06/25/2020 1629   Sepsis Labs: @LABRCNTIP (procalcitonin:4,lacticidven:4)  )No results found for this or any previous visit (from the past 240 hour(s)).    Studies: No results found.  Scheduled Meds:   acetaminophen  650 mg Oral Q6H   Or   acetaminophen  650 mg Rectal Q6H   heparin  5,000 Units Subcutaneous Q8H   isosorbide mononitrate  60 mg Oral QHS   levothyroxine  75 mcg Oral QAC breakfast   loratadine  10 mg Oral Daily   metoprolol tartrate  100 mg Oral BID   potassium chloride  40 mEq Oral Daily   rosuvastatin  10 mg Oral Daily   Vitamin D (Ergocalciferol)  50,000 Units Oral Q7 days    Continuous Infusions:   LOS: 3 days     , MD [facility] Pager [phone]  If 7PM-7AM, please contact night-coverage [email or web address] Password [facility] 11/23/2021, 9:21 AM

## 2021-11-24 ENCOUNTER — Other Ambulatory Visit: Payer: Self-pay

## 2021-11-24 ENCOUNTER — Encounter (HOSPITAL_COMMUNITY)
Admission: EM | Disposition: A | Discharge status: Home / Self Care | Payer: Self-pay | Source: Home / Self Care | Attending: Internal Medicine

## 2021-11-24 ENCOUNTER — Inpatient Hospital Stay (HOSPITAL_COMMUNITY): Payer: Medicare HMO

## 2021-11-24 ENCOUNTER — Inpatient Hospital Stay (HOSPITAL_COMMUNITY): Payer: Medicare HMO | Admitting: Anesthesiology

## 2021-11-24 ENCOUNTER — Encounter (HOSPITAL_COMMUNITY): Payer: Self-pay | Admitting: Family Medicine

## 2021-11-24 DIAGNOSIS — F419 Anxiety disorder, unspecified: Secondary | ICD-10-CM | POA: Diagnosis not present

## 2021-11-24 DIAGNOSIS — S32010A Wedge compression fracture of first lumbar vertebra, initial encounter for closed fracture: Secondary | ICD-10-CM

## 2021-11-24 DIAGNOSIS — Z8673 Personal history of transient ischemic attack (TIA), and cerebral infarction without residual deficits: Secondary | ICD-10-CM | POA: Diagnosis not present

## 2021-11-24 DIAGNOSIS — I2510 Atherosclerotic heart disease of native coronary artery without angina pectoris: Secondary | ICD-10-CM | POA: Diagnosis not present

## 2021-11-24 DIAGNOSIS — M4856XA Collapsed vertebra, not elsewhere classified, lumbar region, initial encounter for fracture: Secondary | ICD-10-CM | POA: Diagnosis not present

## 2021-11-24 DIAGNOSIS — I4891 Unspecified atrial fibrillation: Secondary | ICD-10-CM | POA: Diagnosis not present

## 2021-11-24 DIAGNOSIS — E039 Hypothyroidism, unspecified: Secondary | ICD-10-CM

## 2021-11-24 HISTORY — PX: KYPHOPLASTY: SHX5884

## 2021-11-24 LAB — BASIC METABOLIC PANEL
Anion gap: 8 (ref 5–15)
BUN: 9 mg/dL (ref 8–23)
CO2: 25 mmol/L (ref 22–32)
Calcium: 9 mg/dL (ref 8.9–10.3)
Chloride: 107 mmol/L (ref 98–111)
Creatinine, Ser: 0.64 mg/dL (ref 0.44–1.00)
GFR, Estimated: >60 mL/min (ref >60)
Glucose, Bld: 107 mg/dL — ABNORMAL HIGH (ref 70–99)
Potassium: 3.9 mmol/L (ref 3.5–5.1)
Sodium: 140 mmol/L (ref 135–145)

## 2021-11-24 LAB — SURGICAL PCR SCREEN
MRSA, PCR: NEGATIVE
Staphylococcus aureus: NEGATIVE

## 2021-11-24 SURGERY — KYPHOPLASTY
Anesthesia: General

## 2021-11-24 MED ORDER — SENNA 8.6 MG PO TABS
1.0000 | ORAL_TABLET | Freq: Two times a day (BID) | ORAL | Status: DC
Start: 2021-11-24 — End: 2021-11-25
  Administered 2021-11-24: 8.6 mg via ORAL
  Filled 2021-11-24 (×2): qty 1

## 2021-11-24 MED ORDER — CHLORHEXIDINE GLUCONATE 0.12 % MT SOLN
15.0000 mL | Freq: Once | OROMUCOSAL | Status: AC
  Administered 2021-11-24: 15 mL via OROMUCOSAL
  Filled 2021-11-24: qty 15

## 2021-11-24 MED ORDER — ROCURONIUM BROMIDE 10 MG/ML (PF) SYRINGE
PREFILLED_SYRINGE | INTRAVENOUS | Status: DC | PRN
  Administered 2021-11-24: 50 mg via INTRAVENOUS

## 2021-11-24 MED ORDER — ORAL CARE MOUTH RINSE
15.0000 mL | Freq: Once | OROMUCOSAL | Status: AC
Start: 2021-11-24 — End: 2021-11-24

## 2021-11-24 MED ORDER — CEFAZOLIN SODIUM-DEXTROSE 2-4 GM/100ML-% IV SOLN
2.0000 g | INTRAVENOUS | Status: AC
  Administered 2021-11-24: 2 g via INTRAVENOUS
  Filled 2021-11-24 (×2): qty 100

## 2021-11-24 MED ORDER — LIDOCAINE-EPINEPHRINE 1 %-1:100000 IJ SOLN
INTRAMUSCULAR | Status: AC
  Filled 2021-11-24: qty 1

## 2021-11-24 MED ORDER — BUPIVACAINE HCL (PF) 0.5 % IJ SOLN
INTRAMUSCULAR | Status: AC
  Filled 2021-11-24: qty 30

## 2021-11-24 MED ORDER — PHENOL 1.4 % MT LIQD: 1.0000 | OROMUCOSAL | Status: DC | PRN

## 2021-11-24 MED ORDER — IOPAMIDOL (ISOVUE-300) INJECTION 61%
INTRAVENOUS | Status: DC | PRN
  Administered 2021-11-24: 100 mL

## 2021-11-24 MED ORDER — SUGAMMADEX SODIUM 200 MG/2ML IV SOLN
INTRAVENOUS | Status: DC | PRN
  Administered 2021-11-24: 200 mg via INTRAVENOUS

## 2021-11-24 MED ORDER — ACETAMINOPHEN 325 MG PO TABS: 650.0000 mg | ORAL_TABLET | ORAL | Status: DC | PRN

## 2021-11-24 MED ORDER — MENTHOL 3 MG MT LOZG: 1.0000 | LOZENGE | OROMUCOSAL | Status: DC | PRN

## 2021-11-24 MED ORDER — ONDANSETRON HCL 4 MG/2ML IJ SOLN: 4.0000 mg | Freq: Four times a day (QID) | INTRAMUSCULAR | Status: DC | PRN

## 2021-11-24 MED ORDER — SODIUM CHLORIDE 0.9 % IV SOLN
250.0000 mL | INTRAVENOUS | Status: DC
  Administered 2021-11-24: 250 mL via INTRAVENOUS

## 2021-11-24 MED ORDER — BUPIVACAINE HCL (PF) 0.5 % IJ SOLN
INTRAMUSCULAR | Status: DC | PRN
  Administered 2021-11-24: 5 mL

## 2021-11-24 MED ORDER — SODIUM CHLORIDE 0.9% FLUSH
3.0000 mL | Freq: Two times a day (BID) | INTRAVENOUS | Status: DC
  Administered 2021-11-24 – 2021-11-25 (×2): 3 mL via INTRAVENOUS

## 2021-11-24 MED ORDER — LIDOCAINE 2% (20 MG/ML) 5 ML SYRINGE
INTRAMUSCULAR | Status: DC | PRN
  Administered 2021-11-24: 40 mg via INTRAVENOUS

## 2021-11-24 MED ORDER — DEXAMETHASONE SODIUM PHOSPHATE 10 MG/ML IJ SOLN
INTRAMUSCULAR | Status: DC | PRN
  Administered 2021-11-24: 5 mg via INTRAVENOUS

## 2021-11-24 MED ORDER — CHLORHEXIDINE GLUCONATE CLOTH 2 % EX PADS
6.0000 | MEDICATED_PAD | Freq: Once | CUTANEOUS | Status: AC
  Administered 2021-11-24: 6 via TOPICAL

## 2021-11-24 MED ORDER — FENTANYL CITRATE (PF) 250 MCG/5ML IJ SOLN
INTRAMUSCULAR | Status: DC | PRN
  Administered 2021-11-24 (×2): 50 ug via INTRAVENOUS

## 2021-11-24 MED ORDER — PHENYLEPHRINE 80 MCG/ML (10ML) SYRINGE FOR IV PUSH (FOR BLOOD PRESSURE SUPPORT)
PREFILLED_SYRINGE | INTRAVENOUS | Status: DC | PRN
  Administered 2021-11-24: 160 ug via INTRAVENOUS

## 2021-11-24 MED ORDER — BISACODYL 10 MG RE SUPP: 10.0000 mg | Freq: Every day | RECTAL | Status: DC | PRN

## 2021-11-24 MED ORDER — DOCUSATE SODIUM 100 MG PO CAPS
100.0000 mg | ORAL_CAPSULE | Freq: Two times a day (BID) | ORAL | Status: DC
  Administered 2021-11-24: 100 mg via ORAL
  Filled 2021-11-24 (×2): qty 1

## 2021-11-24 MED ORDER — LIDOCAINE-EPINEPHRINE 1 %-1:100000 IJ SOLN
INTRAMUSCULAR | Status: DC | PRN
  Administered 2021-11-24: 5 mL

## 2021-11-24 MED ORDER — FENTANYL CITRATE (PF) 250 MCG/5ML IJ SOLN
INTRAMUSCULAR | Status: AC
  Filled 2021-11-24: qty 5

## 2021-11-24 MED ORDER — CHLORHEXIDINE GLUCONATE 0.12 % MT SOLN
OROMUCOSAL | Status: AC
  Filled 2021-11-24: qty 15

## 2021-11-24 MED ORDER — POLYETHYLENE GLYCOL 3350 17 G PO PACK: 17.0000 g | PACK | Freq: Every day | ORAL | Status: DC | PRN

## 2021-11-24 MED ORDER — ONDANSETRON HCL 4 MG PO TABS: 4.0000 mg | ORAL_TABLET | Freq: Four times a day (QID) | ORAL | Status: DC | PRN

## 2021-11-24 MED ORDER — 0.9 % SODIUM CHLORIDE (POUR BTL) OPTIME
TOPICAL | Status: DC | PRN
  Administered 2021-11-24: 1000 mL

## 2021-11-24 MED ORDER — FLEET ENEMA 7-19 GM/118ML RE ENEM: 1.0000 | ENEMA | Freq: Once | RECTAL | Status: DC | PRN

## 2021-11-24 MED ORDER — CHLORHEXIDINE GLUCONATE CLOTH 2 % EX PADS: 6.0000 | MEDICATED_PAD | Freq: Once | CUTANEOUS | Status: AC

## 2021-11-24 MED ORDER — PROPOFOL 10 MG/ML IV BOLUS
INTRAVENOUS | Status: DC | PRN
  Administered 2021-11-24: 60 mg via INTRAVENOUS

## 2021-11-24 MED ORDER — CEFAZOLIN SODIUM-DEXTROSE 2-4 GM/100ML-% IV SOLN
2.0000 g | Freq: Three times a day (TID) | INTRAVENOUS | Status: AC
  Administered 2021-11-24 – 2021-11-25 (×2): 2 g via INTRAVENOUS
  Filled 2021-11-24 (×2): qty 100

## 2021-11-24 MED ORDER — ACETAMINOPHEN 650 MG RE SUPP: 650.0000 mg | RECTAL | Status: DC | PRN

## 2021-11-24 MED ORDER — ONDANSETRON HCL 4 MG/2ML IJ SOLN
INTRAMUSCULAR | Status: DC | PRN
  Administered 2021-11-24: 4 mg via INTRAVENOUS

## 2021-11-24 MED ORDER — ALUM & MAG HYDROXIDE-SIMETH 200-200-20 MG/5ML PO SUSP: 30.0000 mL | Freq: Four times a day (QID) | ORAL | Status: DC | PRN

## 2021-11-24 MED ORDER — SODIUM CHLORIDE 0.9% FLUSH: 3.0000 mL | INTRAVENOUS | Status: DC | PRN

## 2021-11-24 MED ORDER — LACTATED RINGERS IV SOLN
INTRAVENOUS | Status: DC
Start: 2021-11-24 — End: 2021-11-24

## 2021-11-24 SURGICAL SUPPLY — 46 items
ADH SKN CLS APL DERMABOND .7 (GAUZE/BANDAGES/DRESSINGS) ×1
ADH SKN CLS LQ APL DERMABOND (GAUZE/BANDAGES/DRESSINGS) ×1
BAG COUNTER SPONGE SURGICOUNT (BAG) ×3 IMPLANT
BAG SPNG CNTER NS LX DISP (BAG) ×1
BLADE CLIPPER SURG (BLADE) IMPLANT
BLADE SURG 11 STRL SS (BLADE) ×3 IMPLANT
BNDG ADH 1X3 SHEER STRL LF (GAUZE/BANDAGES/DRESSINGS) ×8 IMPLANT
BNDG ADH THN 3X1 STRL LF (GAUZE/BANDAGES/DRESSINGS)
CEMENT KYPHON CX01A KIT/MIXER (Cement) ×1 IMPLANT
CONT SPEC 4OZ CLIKSEAL STRL BL (MISCELLANEOUS) ×3 IMPLANT
DERMABOND ADHESIVE PROPEN (GAUZE/BANDAGES/DRESSINGS) ×1
DERMABOND ADVANCED (GAUZE/BANDAGES/DRESSINGS) ×1
DERMABOND ADVANCED .7 DNX12 (GAUZE/BANDAGES/DRESSINGS) IMPLANT
DERMABOND ADVANCED .7 DNX6 (GAUZE/BANDAGES/DRESSINGS) IMPLANT
DRAPE C-ARM 42X72 X-RAY (DRAPES) ×3 IMPLANT
DRAPE HALF SHEET 40X57 (DRAPES) ×2 IMPLANT
DRAPE INCISE IOBAN 66X45 STRL (DRAPES) ×2 IMPLANT
DRAPE LAPAROTOMY 100X72X124 (DRAPES) ×3 IMPLANT
DRAPE WARM FLUID 44X44 (DRAPES) ×3 IMPLANT
DURAPREP 26ML APPLICATOR (WOUND CARE) ×3 IMPLANT
GAUZE 4X4 16PLY ~~LOC~~+RFID DBL (SPONGE) ×2 IMPLANT
GLOVE BIOGEL PI IND STRL 8.5 (GLOVE) ×2 IMPLANT
GLOVE BIOGEL PI INDICATOR 8.5 (GLOVE) ×1
GLOVE ECLIPSE 8.5 STRL (GLOVE) ×3 IMPLANT
GLOVE EXAM NITRILE XL STR (GLOVE) IMPLANT
GOWN STRL REUS W/ TWL LRG LVL3 (GOWN DISPOSABLE) IMPLANT
GOWN STRL REUS W/ TWL XL LVL3 (GOWN DISPOSABLE) ×2 IMPLANT
GOWN STRL REUS W/TWL 2XL LVL3 (GOWN DISPOSABLE) ×3 IMPLANT
GOWN STRL REUS W/TWL LRG LVL3 (GOWN DISPOSABLE)
GOWN STRL REUS W/TWL XL LVL3 (GOWN DISPOSABLE) ×2
KIT BASIN OR (CUSTOM PROCEDURE TRAY) ×3 IMPLANT
KIT TURNOVER KIT B (KITS) ×3 IMPLANT
NDL HYPO 25X1 1.5 SAFETY (NEEDLE) ×2 IMPLANT
NEEDLE HYPO 25X1 1.5 SAFETY (NEEDLE) ×2 IMPLANT
NS IRRIG 1000ML POUR BTL (IV SOLUTION) ×3 IMPLANT
PACK LAMINECTOMY NEURO (CUSTOM PROCEDURE TRAY) ×1 IMPLANT
PACK SURGICAL SETUP 50X90 (CUSTOM PROCEDURE TRAY) ×3 IMPLANT
PAD ARMBOARD 7.5X6 YLW CONV (MISCELLANEOUS) ×9 IMPLANT
SPECIMEN JAR SMALL (MISCELLANEOUS) IMPLANT
SPIKE FLUID TRANSFER (MISCELLANEOUS) ×2 IMPLANT
SUT VICRYL RAPIDE 4/0 PS 2 (SUTURE) ×3 IMPLANT
SYR CONTROL 10ML LL (SYRINGE) ×4 IMPLANT
TAMP BONE INFLATABLE 10/3 (BALLOONS) ×2 IMPLANT
TOWEL GREEN STERILE (TOWEL DISPOSABLE) ×3 IMPLANT
TOWEL GREEN STERILE FF (TOWEL DISPOSABLE) ×3 IMPLANT
TRAY KYPHOPAK 20/3 ONESTEP 1ST (MISCELLANEOUS) ×1 IMPLANT

## 2021-11-24 NOTE — Transfer of Care (Signed)
Immediate Anesthesia Transfer of Care Note  Patient: Evelyn [name]  Procedure(s) Performed: LUMBAR ONE KYPHOPLASTY  Patient Location: PACU  Anesthesia Type:General  Level of Consciousness: drowsy  Airway & Oxygen Therapy: Patient Spontanous Breathing and Patient connected to nasal cannula oxygen  Post-op Assessment: Report given to RN and Post -op Vital signs reviewed and stable  Post vital signs: Reviewed and stable  Last Vitals:  Vitals Value Taken Time  BP 121/87 11/24/21 1546  Temp    Pulse 52 11/24/21 1549  Resp 14 11/24/21 1549  SpO2 97 % 11/24/21 1549  Vitals shown include unvalidated device data.  Last Pain:  Vitals:   11/24/21 1419  TempSrc:   PainSc: 0-No pain      Patients Stated Pain Goal: 0 (11/22/21 2006)  Complications: No notable events documented.

## 2021-11-24 NOTE — Plan of Care (Signed)

## 2021-11-24 NOTE — Progress Notes (Signed)
[facility]                                                                               Evelyn [name], is a 81 y.o. female, DOB - Jun 21, 1940, ZOX:[id number] Admit date - 11/19/2021    Outpatient Primary MD for the patient is Daphine Deutscher, Mary-Margaret, FNP  LOS - 4  days    Brief summary   81 y/o female with paroxysmal atrial fibrillation chronically anticoagulated with apixaban, longstanding CAD status post stent placement, status post bypass surgery LIMA to LAD, SVG to diagonal and SVG to OM in 2004 and hyperlipidemia, chronic pain chronic cervical disc disease, prior back and neck surgeries, chronic pain, hypothyroidism, hypertension, osteoporosis who presented to the emergency department with acute uncontrolled back pain.  She had been seen at [facility], ED on 10/14/2021 and diagnosed with an L1 compression fracture.  She was placed in a TLSO brace and given pain management and discharged home and followed up with the [place] neurosurgery clinic.  She has been taking hydrocodone at home but no relief in pain.  She apparently attempted to get out of bed and walk and the pain became so severe she could not stand or move and called EMS to bring her to the emergency department.  CT scan reveals worsening L1 compression fracture.  This was discussed with neurosurgeon Dr. Danielle Dess who recommended that patient be admitted to the hospitalist service at [facility] and neurosurgery will consult for consideration of kyphoplasty if patient is cleared by cardiology for the procedure.  The plan would be for her to have the procedure on Monday if cleared and requested that apixaban be held.  She was noted to be in atrial fibrillation with RVR and briefly was placed on a Cardizem infusion and after rate has been controlled she is now back on oral metoprolol 100 mg twice daily which she takes normally.  She will be admitted to [facility] service at [facility].   She is scheduled for kyphoplasty today.    Assessment & Plan    Assessment and Plan: * Atrial fibrillation with RVR (HCC) - Treated with IV diltiazem infusion in the ED and has been discontinued as rate has improved and resumed home metoprolol 100 mg twice daily - Eliquis has been held for kyphoplasty today.     Compression fracture of L1 vertebra (HCC) - Patient has failed conservative management at home and after speaking with Dr. Danielle Dess with neurosurgery patient is scheduled for  kyphoplasty procedure today.  - pain control.  - therapy eval tomorrow.    Vitamin D def; Supplementation added.    Abnormal nuclear cardiac imaging test - Significant CAD by history will request cardiology for preoperative evaluation - recommend outpatient follow up with cardiology.   Hypothyroidism - Plan to resume home levothyroxine daily - TSH markedly elevated , but free t4 is wnl.  - recommend rechecking thyroid panel outpatient in 2 to 4 weeks.   Hypokalemia Replaced. Recheck in am, .  Failure to thrive in adult - Secondary to uncontrolled pain and difficulty ambulating due to worsening L1 compression fracture - therapy eval recommended .  Acquired thrombophilia (HCC) - Temporarily holding apixaban  in anticipation for procedure.   Intractable back pain - Failed outpatient management now considering kyphoplasty as noted   Long term (current) use of anticoagulants - Temporarily holding apixaban  Mixed hyperlipidemia - Resume home statin therapy  DDD (degenerative disc disease), cervical - Currently stable no acute symptoms  Pain management - Presenting with uncontrolled pain, IV pain management ordered  Essential hypertension - BP parameters are  sub optimal probably from pain.  Continue with IMDUR. - prn hydralazine ordered.  -   CAD, PCI '02, '04. CABG X 3 2004 - Resume all home cardiac medications with the exception of apixaban which is temporarily being held for possible procedure 11/23/2021           Estimated body mass index is 20.73 kg/m as calculated from the following:   Height as of this encounter: 5\' 2"  (1.575 m).   Weight as of this encounter: 51.4 kg.  Code Status: full code.  DVT Prophylaxis:  SCD's Start: 11/24/21 0946 heparin injection 5,000 Units Start: 11/20/21 0600 SCDs Start: 11/20/21 0531   Level of Care: Level of care: Telemetry Cardiac Family Communication: none at bedside.   Disposition Plan:     Remains inpatient appropriate:  kyphoplasty today.   Procedures:  Kyphoplasty.   Consultants:   Cardiology Neurosurgery.   Antimicrobials:   Anti-infectives (From admission, onward)    Start     Dose/Rate Route Frequency Ordered Stop   11/24/21 1300  ceFAZolin (ANCEF) IVPB 2g/100 mL premix        2 g 200 mL/hr over 30 Minutes Intravenous To ShortStay Surgical 11/24/21 0945 11/25/21 1300        Medications  Scheduled Meds:  acetaminophen  650 mg Oral Q6H   Or   acetaminophen  650 mg Rectal Q6H   heparin  5,000 Units Subcutaneous Q8H   isosorbide mononitrate  60 mg Oral QHS   levothyroxine  75 mcg Oral QAC breakfast   loratadine  10 mg Oral Daily   metoprolol tartrate  100 mg Oral BID   potassium chloride  40 mEq Oral Daily   rosuvastatin  10 mg Oral Daily   Vitamin D (Ergocalciferol)  50,000 Units Oral Q7 days   Continuous Infusions:   ceFAZolin (ANCEF) IV     PRN Meds:.diltiazem, HYDROcodone-acetaminophen, HYDROmorphone (DILAUDID) injection, methocarbamol, ondansetron **OR** ondansetron (ZOFRAN) IV    Subjective:   Evelyn [name] was seen and examined today.  Pain not well controlled.  Objective:   Vitals:   11/23/21 2027 11/23/21 2030 11/24/21 0425 11/24/21 1012  BP:  (!) 161/75 (!) 171/79 (!) 156/68  Pulse: (!) 59   60  Resp: 20  18   Temp: 97.6 F (36.4 C)  98.9 F (37.2 C)   TempSrc: Axillary  Oral   SpO2:      Weight:      Height:       No intake or output data in the 24 hours ending 11/24/21 1235 Filed Weights    11/19/21 2045 11/20/21 1326  Weight: 49.4 kg 51.4 kg     Exam General: Alert and oriented x 3, NAD Cardiovascular: S1 S2 auscultated, no murmurs, RRR Respiratory: Clear to auscultation bilaterally, no wheezing, rales or rhonchi Gastrointestinal: Soft, nontender, nondistended, + bowel sounds Ext: no pedal edema bilaterally Neuro: AAOx3, Cr N's II- XII. Strength 5/5 upper and lower extremities bilaterally Skin: No rashes Psych: Normal affect and demeanor, alert and oriented x3    Data Reviewed:  I have personally reviewed following labs  and imaging studies   CBC Lab Results  Component Value Date   WBC 7.4 11/20/2021   RBC 4.28 11/20/2021   HGB 13.0 11/20/2021   HCT 40.2 11/20/2021   MCV 93.9 11/20/2021   MCH 30.4 11/20/2021   PLT 212 11/20/2021   MCHC 32.3 11/20/2021   RDW 15.4 11/20/2021   LYMPHSABS 2.7 11/20/2021   MONOABS 0.9 11/20/2021   EOSABS 0.0 11/20/2021   BASOSABS 0.0 [id number]     Last metabolic panel Lab Results  Component Value Date   NA 140 11/24/2021   K 3.9 11/24/2021   CL 107 11/24/2021   CO2 25 11/24/2021   BUN 9 11/24/2021   CREATININE 0.64 11/24/2021   GLUCOSE 107 (H) 11/24/2021   GFRNONAA >60 11/24/2021   GFRAA 98 06/26/2020   CALCIUM 9.0 11/24/2021   PROT 6.5 11/20/2021   ALBUMIN 3.2 (L) 11/20/2021   LABGLOB 2.4 03/27/2021   AGRATIO 1.9 03/27/2021   BILITOT 0.8 11/20/2021   ALKPHOS 53 11/20/2021   AST 11 (L) 11/20/2021   ALT 9 11/20/2021   ANIONGAP 8 11/24/2021    CBG (last 3)  No results for input(s): "GLUCAP" in the last 72 hours.    Coagulation Profile: No results for input(s): "INR", "PROTIME" in the last 168 hours.   Radiology Studies: No results found.     Hosie Poisson M.D. [facility] 11/24/2021, 12:35 PM  Available via Epic secure chat 7am-7pm After 7 pm, please refer to night coverage provider listed on amion.

## 2021-11-24 NOTE — Interval H&P Note (Signed)
History and Physical Interval Note:  11/24/2021 7:56 AM  Evelyn [name]  has presented today for surgery, with the diagnosis of L1 Compression fracture.  The various methods of treatment have been discussed with the patient and family. After consideration of risks, benefits and other options for treatment, the patient has consented to  Procedure(s) with comments: L1 KYPHOPLASTY (N/A) - RM 20 to follow as a surgical intervention.  The patient's history has been reviewed, patient examined, no change in status, stable for surgery.  I have reviewed the patient's chart and labs.  Questions were answered to the patient's satisfaction.     Stefani Dama

## 2021-11-24 NOTE — Anesthesia Postprocedure Evaluation (Signed)
Anesthesia Post Note  Patient: Evelyn [name]  Procedure(s) Performed: LUMBAR ONE KYPHOPLASTY     Patient location during evaluation: PACU Anesthesia Type: General Level of consciousness: awake and alert Pain management: pain level controlled Vital Signs Assessment: post-procedure vital signs reviewed and stable Respiratory status: spontaneous breathing, nonlabored ventilation, respiratory function stable and patient connected to nasal cannula oxygen Cardiovascular status: blood pressure returned to baseline and stable Postop Assessment: no apparent nausea or vomiting Anesthetic complications: no   No notable events documented.  Last Vitals:  Vitals:   11/24/21 1603 11/24/21 1618  BP: (!) 123/59 (!) 148/64  Pulse: (!) 55 60  Resp: 14 19  Temp:  36.4 C  SpO2: 95% 96%    Last Pain:  Vitals:   11/24/21 1618  TempSrc:   PainSc: 0-No pain                 Marcee Jacobs L [name]

## 2021-11-24 NOTE — Anesthesia Preprocedure Evaluation (Addendum)
Anesthesia Evaluation  Patient identified by MRN, date of birth, ID band Patient awake    Reviewed: Allergy & Precautions, NPO status , Patient's Chart, lab work & pertinent test results, reviewed documented beta blocker date and time   Airway Mallampati: I  TM Distance: >3 FB Neck ROM: Full    Dental  (+) Missing, Dental Advisory Given,    Pulmonary neg pulmonary ROS,    Pulmonary exam normal breath sounds clear to auscultation       Cardiovascular hypertension, Pt. on home beta blockers and Pt. on medications + angina + CAD, + Cardiac Stents and + CABG  Normal cardiovascular exam+ dysrhythmias Atrial Fibrillation  Rhythm:Regular Rate:Normal  TTE 2018 - Left ventricle: The cavity size was normal. Systolic function was  normal. The estimated ejection fraction was in the range of 55%  to 60%. Mild hypokinesis of the inferior and inferoseptal  myocardium. Doppler parameters are consistent with abnormal left  ventricular relaxation (grade 1 diastolic dysfunction). Doppler  parameters are consistent with indeterminate ventricular filling  pressure.  - Aortic valve: Transvalvular velocity was within the normal range.  There was no stenosis. There was mild regurgitation.  - Aorta: Ascending aortic diameter: 37 mm (S).  - Ascending aorta: The ascending aorta was mildly dilated.  - Mitral valve: Transvalvular velocity was within the normal range.  There was no evidence for stenosis. There was mild regurgitation.  - Right ventricle: The cavity size was normal. Wall thickness was  normal. Systolic function was normal.  - Atrial septum: No defect or patent foramen ovale was identified  by color flow Doppler or saline microcavitation study.  - Tricuspid valve: There was moderate regurgitation.  - Pulmonary arteries: Systolic pressure was within the normal  range. PA peak pressure: 27 mm Hg (S).  LHC 2018 1.  Significant underlying one-vessel coronary artery disease with chronically occluded stents in the LAD. Patent LIMA to LAD and patent SVG to first diagonal. No significant obstructive disease affecting the distal left main or ostial left circumflex. The SVG to OM is known to be occluded and was not injected. 2. Normal LV systolic function mildly elevated left ventricular end-diastolic pressure.    Neuro/Psych PSYCHIATRIC DISORDERS Anxiety TIA   GI/Hepatic Neg liver ROS, GERD  ,  Endo/Other  Hypothyroidism   Renal/GU negative Renal ROS  negative genitourinary   Musculoskeletal  (+) Arthritis , Osteoarthritis,    Abdominal   Peds  Hematology  (+) Blood dyscrasia (on eliquis), ,   Anesthesia Other Findings 81 y/o female with paroxysmal atrial fibrillation chronically anticoagulated with apixaban, longstanding CAD status post stent placement, status post bypass surgery LIMA to LAD, SVG to diagonal and SVG to OM in 2004 and hyperlipidemia, chronic pain chronic cervical disc disease, prior back and neck surgeries, chronic pain, hypothyroidism, hypertension, osteoporosis who presented to the emergency department with acute uncontrolled back pain  Reproductive/Obstetrics                            Anesthesia Physical Anesthesia Plan  ASA: 3  Anesthesia Plan: General   Post-op Pain Management: Tylenol PO (pre-op)*   Induction: Intravenous  PONV Risk Score and Plan: 3 and Dexamethasone, Ondansetron and Treatment may vary due to age or medical condition  Airway Management Planned: Oral ETT and Video Laryngoscope Planned  Additional Equipment:   Intra-op Plan:   Post-operative Plan: Extubation in OR  Informed Consent: I have reviewed the patients History and Physical,  chart, labs and discussed the procedure including the risks, benefits and alternatives for the proposed anesthesia with the patient or authorized representative who has indicated his/her  understanding and acceptance.     Dental advisory given  Plan Discussed with: CRNA  Anesthesia Plan Comments:         Anesthesia Quick Evaluation

## 2021-11-24 NOTE — Op Note (Signed)
Date of surgery: 11/24/2021 Preoperative diagnosis: L1 compression fracture with delayed healing Postoperative diagnosis: Same Procedure: L1 acrylic balloon kyphoplasty Surgeon: Barnett Abu Anesthesia: General endotracheal Indications Evelyn [name] is an 81 year old individual who suffered a L1 compression fracture with minor trauma approximately 3 weeks ago.  She has had progressively worsening pain and was hospitalized this past weekend.  She has been on anticoagulation for atrial fibrillation.  This is been stopped and she is being bridged currently.  She is taken to the operating room to undergo an acrylic balloon kyphoplasty.  Procedure: Patient was brought to the operating room supine on the stretcher.  After the smooth induction of general tracheal anesthesia, she was carefully turned prone.  Biplane fluoroscopy was brought into view the L1 vertebrae in orthogonal fashion.  Then the skin was cleansed with alcohol and DuraPrep and draped in a sterile fashion.  Entry sites were chosen at the 9:00 and 3:00 positions from each of the pedicles at L1.  Skin was infiltrated with 1% lidocaine with epinephrine mixed 50-50 with half percent Marcaine for total volume of 10 cc.  Then a stab incision was made in each of the chosen areas and the Jamshidi needle was then directed towards the superior aspect on the lateral side of the pedicle on the left and the pedicle on the right.  By puncturing through the pedicle the needle was advanced into the vertebral body.  This was was done under fluoroscopic guidance.  Once the vertebral body was entered the central cannula was removed and a drill was used to drill into the vertebral body.  Through the drilled holes then balloon was inserted and this was inflated 250 mmHg pressure and this quickly deteriorated to approximately 100 mm or less.  Balloon filling revealed good filling towards the midline on either balloon.  Once an adequate cavity was created the cement was  mixed to the appropriate consistency and then a total of 4-1/2 cc of cement was injected through both cannula into the vertebral body with good filling of the vertebrae a slight ventral strap fixation was noted at L1.  Cement filling was stopped at that point.  With this being the case the cement cannulas were removed the inner cannula was of the trocar were replaced and after allowing the cement to dry for the few minutes the inner cannulas were removed no tails were noted.  Good filling of the vertebral body was noted.  The skin was closed with a singular 4-0 Vicryl Rapide in either incision and the patient was returned to recovery room in stable condition.  Blood loss for the procedure was nil.

## 2021-11-24 NOTE — Anesthesia Procedure Notes (Signed)
Procedure Name: Intubation Date/Time: 11/24/2021 2:57 PM  Performed by: Eligha Bridegroom, CRNAPre-anesthesia Checklist: Patient identified, Emergency Drugs available, Patient being monitored and Suction available Patient Re-evaluated:Patient Re-evaluated prior to induction Oxygen Delivery Method: Circle system utilized Preoxygenation: Pre-oxygenation with 100% oxygen Induction Type: IV induction Ventilation: Mask ventilation without difficulty Laryngoscope Size: Mac and 3 Grade View: Grade I Tube type: Oral Tube size: 7.0 mm Number of attempts: 1 Airway Equipment and Method: Stylet Placement Confirmation: ETT inserted through vocal cords under direct vision, positive ETCO2 and breath sounds checked- equal and bilateral Secured at: 21 cm Tube secured with: Tape Dental Injury: Teeth and Oropharynx as per pre-operative assessment

## 2021-11-24 NOTE — Care Management Important Message (Signed)
Important Message  Patient Details  Name: Evelyn [name] MRN: [id number] Date of Birth: March 07, 1941   Medicare Important Message Given:  Yes     Jarrod Mcenery Stefan Church 11/24/2021, 3:53 PM

## 2021-11-25 ENCOUNTER — Encounter (HOSPITAL_COMMUNITY): Payer: Self-pay | Admitting: Neurological Surgery

## 2021-11-25 DIAGNOSIS — I4891 Unspecified atrial fibrillation: Secondary | ICD-10-CM | POA: Diagnosis not present

## 2021-11-25 DIAGNOSIS — I480 Paroxysmal atrial fibrillation: Secondary | ICD-10-CM | POA: Diagnosis not present

## 2021-11-25 DIAGNOSIS — S32010A Wedge compression fracture of first lumbar vertebra, initial encounter for closed fracture: Secondary | ICD-10-CM | POA: Diagnosis not present

## 2021-11-25 DIAGNOSIS — S32010D Wedge compression fracture of first lumbar vertebra, subsequent encounter for fracture with routine healing: Secondary | ICD-10-CM

## 2021-11-25 DIAGNOSIS — R931 Abnormal findings on diagnostic imaging of heart and coronary circulation: Secondary | ICD-10-CM | POA: Diagnosis not present

## 2021-11-25 LAB — BASIC METABOLIC PANEL
Anion gap: 7 (ref 5–15)
BUN: 7 mg/dL — ABNORMAL LOW (ref 8–23)
CO2: 24 mmol/L (ref 22–32)
Calcium: 9.3 mg/dL (ref 8.9–10.3)
Chloride: 105 mmol/L (ref 98–111)
Creatinine, Ser: 0.61 mg/dL (ref 0.44–1.00)
GFR, Estimated: >60 mL/min (ref >60)
Glucose, Bld: 139 mg/dL — ABNORMAL HIGH (ref 70–99)
Potassium: 4.3 mmol/L (ref 3.5–5.1)
Sodium: 136 mmol/L (ref 135–145)

## 2021-11-25 LAB — CBC WITH DIFFERENTIAL/PLATELET
Abs Immature Granulocytes: 0.03 10*3/uL (ref 0.00–0.07)
Basophils Absolute: 0 10*3/uL (ref 0.0–0.1)
Basophils Relative: 0 %
Eosinophils Absolute: 0 10*3/uL (ref 0.0–0.5)
Eosinophils Relative: 0 %
HCT: 37.7 % (ref 36.0–46.0)
Hemoglobin: 12.7 g/dL (ref 12.0–15.0)
Immature Granulocytes: 0 %
Lymphocytes Relative: 20 %
Lymphs Abs: 1.5 10*3/uL (ref 0.7–4.0)
MCH: 30.5 pg (ref 26.0–34.0)
MCHC: 33.7 g/dL (ref 30.0–36.0)
MCV: 90.6 fL (ref 80.0–100.0)
Monocytes Absolute: 0.2 10*3/uL (ref 0.1–1.0)
Monocytes Relative: 3 %
Neutro Abs: 5.8 10*3/uL (ref 1.7–7.7)
Neutrophils Relative %: 77 %
Platelets: 260 10*3/uL (ref 150–400)
RBC: 4.16 MIL/uL (ref 3.87–5.11)
RDW: 15.6 % — ABNORMAL HIGH (ref 11.5–15.5)
WBC: 7.5 10*3/uL (ref 4.0–10.5)
nRBC: 0 % (ref 0.0–0.2)

## 2021-11-25 MED ORDER — SENNA 8.6 MG PO TABS: 1.0000 | ORAL_TABLET | Freq: Two times a day (BID) | ORAL | 0 refills | Status: DC

## 2021-11-25 MED ORDER — POLYETHYLENE GLYCOL 3350 17 G PO PACK: 17.0000 g | PACK | Freq: Every day | ORAL | 0 refills | Status: DC | PRN

## 2021-11-25 MED ORDER — VITAMIN D (ERGOCALCIFEROL) 1.25 MG (50000 UNIT) PO CAPS: 50000.0000 [IU] | ORAL_CAPSULE | ORAL | 0 refills | Status: DC

## 2021-11-25 MED ORDER — DOCUSATE SODIUM 100 MG PO CAPS: 100.0000 mg | ORAL_CAPSULE | Freq: Two times a day (BID) | ORAL | 0 refills | Status: DC

## 2021-11-25 MED ORDER — APIXABAN 2.5 MG PO TABS
2.5000 mg | ORAL_TABLET | Freq: Two times a day (BID) | ORAL | Status: DC
  Administered 2021-11-25: 2.5 mg via ORAL
  Filled 2021-11-25: qty 1

## 2021-11-25 NOTE — Evaluation (Signed)
Occupational Therapy Evaluation Patient Details Name: Evelyn [name] MRN: [id number] DOB: [birth year] Today's Date: 11/25/2021   History of Present Illness 81 y.o. female presents to [facility] hospital on 11/20/2021 with acute uncontrolled back pain, recently diagnosed with acute L1 compression fx on 10/14/2021. Pt also noted to be in afib with RVR. Pt underwent L1 kyphoplasty on 11/24/2021. PMH includes PAF, CAD, HLD, chronic pain, HTN.   Clinical Impression   Pt PTA: Pt living at home with children and reports independence, but recently growing more uncomfortable for lower back pain. Pt currently, performing ADL tasks with supervisionA to modified independence overall. Pt adhering to back precautions well and states "I have been doing all of this for the past few weeks because of my back pain." Pt performing log roll for bed mobility with supervisionA and all mobility in room with modified independence. Pt performed dressing tasks and bathing tasks in standing at sink.  Pt does not require continued OT skilled services. OT signing off.      Recommendations for follow up therapy are one component of a multi-disciplinary discharge planning process, led by the attending physician.  Recommendations may be updated based on patient status, additional functional criteria and insurance authorization.   Follow Up Recommendations  No OT follow up    Assistance Recommended at Discharge None  Patient can return home with the following      Functional Status Assessment  Patient has had a recent decline in their functional status and demonstrates the ability to make significant improvements in function in a reasonable and predictable amount of time.  Equipment Recommendations  None recommended by OT    Recommendations for Other Services       Precautions / Restrictions Precautions Precautions: Fall;Back (back for comfort) Precaution Booklet Issued: No Precaution Comments: verbally reviewed back  precautions Restrictions Weight Bearing Restrictions: No      Mobility Bed Mobility Overal bed mobility: Needs Assistance Bed Mobility: Rolling, Sidelying to Sit Rolling: Supervision Sidelying to sit: Supervision       General bed mobility comments: verbal cues for log roll    Transfers Overall transfer level: Needs assistance Equipment used: None Transfers: Sit to/from Stand Sit to Stand: Supervision                  Balance Overall balance assessment: Needs assistance Sitting-balance support: No upper extremity supported, Feet supported Sitting balance-Leahy Scale: Good     Standing balance support: No upper extremity supported, During functional activity Standing balance-Leahy Scale: Good                             ADL either performed or assessed with clinical judgement   ADL Overall ADL's : At baseline;Modified independent                                       General ADL Comments: Pt performing ADL tasks with supervisionA to modified independence overall. Pt adhering to back precautions well and states "I have been doing all of this for the past few weeks because of my back pain." Pt performing log roll for bed mobility with supervisionA and all mobility in room with modified independence.     Vision Baseline Vision/History: 0 No visual deficits Ability to See in Adequate Light: 0 Adequate Patient Visual Report: No change from baseline Vision Assessment?: No apparent visual  deficits     Perception     Praxis      Pertinent Vitals/Pain Pain Assessment Pain Assessment: Faces Faces Pain Scale: Hurts little more Pain Location: back Pain Descriptors / Indicators: Grimacing Pain Intervention(s): Monitored during session     Hand Dominance Right   Extremity/Trunk Assessment Upper Extremity Assessment Upper Extremity Assessment: Overall WFL for tasks assessed   Lower Extremity Assessment Lower Extremity Assessment:  Overall WFL for tasks assessed   Cervical / Trunk Assessment Cervical / Trunk Assessment: Back Surgery (kyphoplasty)   Communication Communication Communication: No difficulties   Cognition Arousal/Alertness: Awake/alert Behavior During Therapy: WFL for tasks assessed/performed Overall Cognitive Status: Within Functional Limits for tasks assessed                                       General Comments  HR stable in 90-110s with exertion.    Exercises     Shoulder Instructions      Home Living Family/patient expects to be discharged to:: Private residence Living Arrangements: Children Available Help at Discharge: Family;Available PRN/intermittently Type of Home: House Home Access: Level entry     Home Layout: One level     Bathroom Shower/Tub: Chief Strategy Officer: Standard     Home Equipment: None          Prior Functioning/Environment Prior Level of Function : Independent/Modified Independent             Mobility Comments: enjoys working for [employer] of elections ADLs Comments: no physical assist        OT Problem List: Decreased activity tolerance      OT Treatment/Interventions:      OT Goals(Current goals can be found in the care plan section) Acute Rehab OT Goals Patient Stated Goal: to go home OT Goal Formulation: All assessment and education complete, DC therapy Potential to Achieve Goals: Good  OT Frequency:      Co-evaluation              AM-PAC OT "6 Clicks" Daily Activity     Outcome Measure Help from another person eating meals?: None Help from another person taking care of personal grooming?: None Help from another person toileting, which includes using toliet, bedpan, or urinal?: None Help from another person bathing (including washing, rinsing, drying)?: None Help from another person to put on and taking off regular upper body clothing?: None Help from another person to put on  and taking off regular lower body clothing?: None 6 Click Score: 24   End of Session Nurse Communication: Mobility status  Activity Tolerance: Patient tolerated treatment well Patient left: in bed;with call bell/phone within reach  OT Visit Diagnosis: Unsteadiness on feet (R26.81)                Time: 8841-6606 OT Time Calculation (min): 25 min Charges:  OT General Charges $OT Visit: 1 Visit OT Evaluation $OT Eval Moderate Complexity: 1 Mod OT Treatments $Self Care/Home Management : 8-22 mins  Flora Lipps, OTR/L Acute Rehabilitation Services Office: [phone]   Lonzo Cloud 11/25/2021, 2:17 PM

## 2021-11-25 NOTE — Progress Notes (Signed)
DAILY PROGRESS NOTE   Patient Name: Evelyn [name] Date of Encounter: 11/25/2021 Cardiologist: Thurmon Fair, MD  Chief Complaint   No complaints  Patient Profile   Evelyn [name] is a 81 y.o. female with a hx of CABG who is being seen 11/20/2021 for the evaluation of preop risk stratification prior to kyphoplasty at the request of Dr. Laural Benes.  Subjective   Maintaining sinus bradycardia. No chest pain. Tolerated kyphoplasty well yesterday.  Objective   Vitals:   11/24/21 1643 11/24/21 2137 11/24/21 2335 11/25/21 0535  BP: (!) 145/62 (!) 149/70 121/63 112/61  Pulse: (!) 59 66 (!) 52 (!) 53  Resp: 15 19 18 17   Temp: 97.6 F (36.4 C)  98.4 F (36.9 C) 98.4 F (36.9 C)  TempSrc: Oral  Axillary Axillary  SpO2: 98% 93% 92% 92%  Weight:      Height:        Intake/Output Summary (Last 24 hours) at 11/25/2021 0808 Last data filed at 11/24/2021 1524 Gross per 24 hour  Intake 500 ml  Output --  Net 500 ml   Filed Weights   11/19/21 2045 11/20/21 1326 11/24/21 1354  Weight: 49.4 kg 51.4 kg 51.4 kg    Physical Exam   General appearance: alert and no distress Lungs: clear to auscultation bilaterally Heart: regular rate and rhythm Abdomen: soft, non-tender; bowel sounds normal; no masses,  no organomegaly Neurologic: Grossly normal  Inpatient Medications    Scheduled Meds:  acetaminophen  650 mg Oral Q6H   Or   acetaminophen  650 mg Rectal Q6H   docusate sodium  100 mg Oral BID   heparin  5,000 Units Subcutaneous Q8H   isosorbide mononitrate  60 mg Oral QHS   levothyroxine  75 mcg Oral QAC breakfast   loratadine  10 mg Oral Daily   metoprolol tartrate  100 mg Oral BID   potassium chloride  40 mEq Oral Daily   rosuvastatin  10 mg Oral Daily   senna  1 tablet Oral BID   sodium chloride flush  3 mL Intravenous Q12H   Vitamin D (Ergocalciferol)  50,000 Units Oral Q7 days    Continuous Infusions:  sodium chloride 250 mL (11/24/21 1749)    PRN  Meds: acetaminophen **OR** acetaminophen, alum & mag hydroxide-simeth, bisacodyl, diltiazem, HYDROcodone-acetaminophen, HYDROmorphone (DILAUDID) injection, menthol-cetylpyridinium **OR** phenol, methocarbamol, ondansetron **OR** ondansetron (ZOFRAN) IV, polyethylene glycol, sodium chloride flush, sodium phosphate   Labs   Results for orders placed or performed during the hospital encounter of 11/19/21 (from the past 48 hour(s))  Basic metabolic panel     Status: Abnormal   Collection Time: 11/24/21  4:28 AM  Result Value Ref Range   Sodium 140 135 - 145 mmol/L   Potassium 3.9 3.5 - 5.1 mmol/L   Chloride 107 98 - 111 mmol/L   CO2 25 22 - 32 mmol/L   Glucose, Bld 107 (H) 70 - 99 mg/dL    Comment: Glucose reference range applies only to samples taken after fasting for at least 8 hours.   BUN 9 8 - 23 mg/dL   Creatinine, Ser 01/25/22 0.44 - 1.00 mg/dL   Calcium 9.0 8.9 - 9.62 mg/dL   GFR, Estimated 83.6 >62 mL/min    Comment: (NOTE) Calculated using the CKD-EPI Creatinine Equation (2021)    Anion gap 8 5 - 15    Comment: Performed at [facility], [address] [place], [place]  Surgical pcr screen  Status: None   Collection Time: 11/24/21  2:12 PM   Specimen: Nasal Mucosa; Nasal Swab  Result Value Ref Range   MRSA, PCR NEGATIVE NEGATIVE   Staphylococcus aureus NEGATIVE NEGATIVE    Comment: (NOTE) The Xpert SA Assay (FDA approved for NASAL specimens in patients 67 years of age and older), is one component of a comprehensive surveillance program. It is not intended to diagnose infection nor to guide or monitor treatment. Performed at [facility], [address] [place], [place] [address]   CBC with Differential/Platelet     Status: Abnormal   Collection Time: 11/25/21  3:47 AM  Result Value Ref Range   WBC 7.5 4.0 - 10.5 K/uL   RBC 4.16 3.87 - 5.11 MIL/uL   Hemoglobin 12.7 12.0 - 15.0 g/dL   HCT 93.2 67.1 - 24.5 %   MCV 90.6 80.0 - 100.0 fL   MCH 30.5  26.0 - 34.0 pg   MCHC 33.7 30.0 - 36.0 g/dL   RDW 80.9 (H) 98.3 - 38.2 %   Platelets 260 150 - 400 K/uL   nRBC 0.0 0.0 - 0.2 %   Neutrophils Relative % 77 %   Neutro Abs 5.8 1.7 - 7.7 K/uL   Lymphocytes Relative 20 %   Lymphs Abs 1.5 0.7 - 4.0 K/uL   Monocytes Relative 3 %   Monocytes Absolute 0.2 0.1 - 1.0 K/uL   Eosinophils Relative 0 %   Eosinophils Absolute 0.0 0.0 - 0.5 K/uL   Basophils Relative 0 %   Basophils Absolute 0.0 0.0 - 0.1 K/uL   Immature Granulocytes 0 %   Abs Immature Granulocytes 0.03 0.00 - 0.07 K/uL    Comment: Performed at [facility], [address], [place], [place] [address]  Basic metabolic panel     Status: Abnormal   Collection Time: 11/25/21  3:47 AM  Result Value Ref Range   Sodium 136 135 - 145 mmol/L   Potassium 4.3 3.5 - 5.1 mmol/L   Chloride 105 98 - 111 mmol/L   CO2 24 22 - 32 mmol/L   Glucose, Bld 139 (H) 70 - 99 mg/dL    Comment: Glucose reference range applies only to samples taken after fasting for at least 8 hours.   BUN 7 (L) 8 - 23 mg/dL   Creatinine, Ser 7.67 0.44 - 1.00 mg/dL   Calcium 9.3 8.9 - 34.1 mg/dL   GFR, Estimated >93 >79 mL/min    Comment: (NOTE) Calculated using the CKD-EPI Creatinine Equation (2021)    Anion gap 7 5 - 15    Comment: Performed at [facility], [address], [place], [place] [address]    ECG   N/A  Telemetry   Sinus bradycardia - Personally Reviewed  Radiology    DG THORACOLUMABAR SPINE  Result Date: 11/24/2021 CLINICAL DATA:  L1 kyphoplasty. EXAM: THORACOLUMBAR SPINE 1V COMPARISON:  Radiograph 11/11/2021 FINDINGS: Single fluoroscopic spot image from the OR demonstrates bone cement in the L1 vertebral body. IMPRESSION: Vertebral augmentation changes at L1. Electronically Signed   By: Rudie Meyer M.D.   On: 11/24/2021 15:43   DG C-Arm 1-60 Min-No Report  Result Date: 11/24/2021 Fluoroscopy was utilized by the requesting physician.  No radiographic interpretation.    Cardiac  Studies   N/A  Assessment   Principal Problem:   Atrial fibrillation with RVR [facility]) Active Problems:   Abnormal nuclear cardiac imaging test   CAD, PCI '02, '04. 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 No chest pain. Restart Eliquis when safe from neurosurgery standpoint. Continue metoprolol 100 mg BID. No further recommendations. Will sign-off.   [facility] will sign off.   Medication Recommendations: restart Eliquis Other recommendations (labs, testing, etc):  none Follow up as an outpatient:  Follow-up with Dr. Royann Shivers or APP at [facility]   Time Spent Directly with Patient:  I have spent a total of 25 minutes with the patient reviewing hospital notes, telemetry, EKGs, labs and examining the patient as well as establishing an assessment and plan that was discussed personally with the patient.  > 50% of time was spent in direct patient care.  Length of Stay:  LOS: 5 days   Chrystie Nose, MD, [facility], FACP  [facility]  Medical Director of the Advanced Lipid Disorders &  Cardiovascular Risk Reduction Clinic Diplomate of the American Board of Clinical Lipidology Attending Cardiologist  Direct Dial: [phone]  Fax: [phone]  Website:  [email or web address] Nicely Morris Longenecker 11/25/2021, 8:08 AM

## 2021-11-25 NOTE — Discharge Summary (Signed)
Physician Discharge Summary   Patient: Evelyn [name] MRN: [id number] DOB: 08-29-1940  Admit date:     11/19/2021  Discharge date: 11/25/2021  Discharge Physician: Kathlen Mody   PCP: Bennie Pierini, FNP   Recommendations at discharge:  Please follow up with PCp in one week.  Please follow up with cardiology as scheduled.   Discharge Diagnoses: Principal Problem:   Atrial fibrillation with RVR (HCC) Active Problems:   Abnormal nuclear cardiac imaging test   Compression fracture of L1 vertebra (HCC)   Hypothyroidism   CAD, PCI '02, '04. 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)   Pain management   DDD (degenerative disc disease), cervical   Mixed hyperlipidemia   Long term (current) use of anticoagulants   Paroxysmal atrial fibrillation (HCC)   Cervical radiculopathy at C5   Intractable back pain   Acquired thrombophilia (HCC)   Failure to thrive in adult   Hypokalemia    Hospital Course: 81 y/o female with paroxysmal atrial fibrillation chronically anticoagulated with apixaban, longstanding CAD status post stent placement, status post bypass surgery LIMA to LAD, SVG to diagonal and SVG to OM in 2004 and hyperlipidemia, chronic pain chronic cervical disc disease, prior back and neck surgeries, chronic pain, hypothyroidism, hypertension, osteoporosis who presented to the emergency department with acute uncontrolled back pain.  She had been seen at [facility], ED on 10/14/2021 and diagnosed with an L1 compression fracture.  She was placed in a TLSO brace and given pain management and discharged home and followed up with the [place] neuro[facility] clinic.  She has been taking hydrocodone at home but no relief in pain.  She apparently attempted to get out of bed and walk and the pain became so severe she could not stand or move and called EMS to bring her to the emergency department.  CT scan  reveals worsening L1 compression fracture.  This was discussed with neurosurgeon Dr. Danielle Dess who recommended that patient be admitted to the hospitalist service at [facility] and neurosurgery will consult for consideration of kyphoplasty if patient is cleared by cardiology for the procedure.  The plan would be for her to have the procedure on Monday if cleared and requested that apixaban be held.  She was noted to be in atrial fibrillation with RVR and briefly was placed on a Cardizem infusion and after rate has been controlled she is now back on oral metoprolol 100 mg twice daily which she takes normally.  She will be admitted to [facility] service at [facility].    Assessment and Plan:  Atrial fibrillation with RVR (HCC) - Treated with IV diltiazem infusion in the ED and has been discontinued as rate has improved and resumed home metoprolol 100 mg twice daily - Eliquis has been held for kyphoplasty today.        Compression fracture of L1 vertebra (HCC) - Patient has failed conservative management at home and after speaking with Dr. Danielle Dess with neurosurgery patient underwent  kyphoplasty procedure and pain is well controlled.    Vitamin D deficiency:  Supplementation added.      Abnormal nuclear cardiac imaging test - Significant CAD by history will request cardiology for preoperative evaluation - recommend outpatient follow up with cardiology.    Hypothyroidism - Plan to resume home levothyroxine daily - TSH markedly elevated , but free t4 is wnl.  - recommend rechecking thyroid panel outpatient in 2 to  4 weeks.    Hypokalemia Replaced.    Failure to thrive in adult - Secondary to uncontrolled pain and difficulty ambulating due to worsening L1 compression fracture - therapy evals requested.    Acquired thrombophilia ([place])    Intractable back pain Back pain resolved with kyphoplasaty.      Long term (current) use of anticoagulants - resume eliquis on discharge.    Mixed  hyperlipidemia - Resume home statin therapy   DDD (degenerative disc disease), cervical - Currently stable no acute symptoms   Pain management - Presenting with uncontrolled pain, IV pain management ordered   Essential hypertension - BP parameters are optimal.     CAD, PCI '02, '04. CABG X 3 2004 - Resume all home cardiac medications on discharge.           Estimated body mass index is 20.73 kg/m as calculated from the following:   Height as of this encounter: 5\' 2"  (1.575 m).   Weight as of this encounter: 51.4 kg.      Consultants: cardiology Neuro surgery.  Procedures performed: kyphoplasty  Disposition: Home Diet recommendation:  Cardiac diet DISCHARGE MEDICATION: Allergies as of 11/25/2021       Reactions   Sulfa Antibiotics Swelling        Medication List     STOP taking these medications    diltiazem 30 MG tablet Commonly known as: Cardizem       TAKE these medications    apixaban 2.5 MG Tabs tablet Commonly known as: Eliquis Take 1 tablet (2.5 mg total) by mouth 2 (two) times daily.   docusate sodium 100 MG capsule Commonly known as: COLACE Take 1 capsule (100 mg total) by mouth 2 (two) times daily.   furosemide 20 MG tablet Commonly known as: LASIX Take 1 tablet (20 mg total) by mouth daily.   HYDROcodone-acetaminophen 5-325 MG tablet Commonly known as: Norco Take 1 tablet by mouth every 6 (six) hours as needed for moderate pain. Start taking on: November 28, 2021   hydrocortisone 2.5 % cream APPLY TO AFFECTED AREA TWICE A DAY What changed: See the new instructions.   isosorbide mononitrate 60 MG 24 hr tablet Commonly known as: IMDUR TAKE 1 TABLET (60 MG TOTAL) DAILY BY MOUTH. What changed:  how much to take how to take this when to take this additional instructions   levothyroxine 75 MCG tablet Commonly known as: SYNTHROID Take 1 tablet (75 mcg total) by mouth daily before breakfast.   loratadine 10 MG tablet Commonly  known as: CLARITIN TAKE 1 TABLET BY MOUTH EVERY DAY What changed:  when to take this reasons to take this   methocarbamol 500 MG tablet Commonly known as: ROBAXIN TAKE 1 TABLET (500 MG TOTAL) BY MOUTH EVERY 12 (TWELVE) HOURS AS NEEDED FOR MUSCLE SPASMS.   metoprolol tartrate 100 MG tablet Commonly known as: LOPRESSOR Take 1 tablet (100 mg total) by mouth 2 (two) times daily.   nitroGLYCERIN 0.4 MG SL tablet Commonly known as: NITROSTAT PLACE 1 TABLET UNDER THE TONGUE EVERY 5 MINUTES AS NEEDED FOR CHEST PAIN. What changed: See the new instructions.   polyethylene glycol 17 g packet Commonly known as: MIRALAX / GLYCOLAX Take 17 g by mouth daily as needed for mild constipation.   RECLAST IV Inject into the vein. Once yearly   rosuvastatin 10 MG tablet Commonly known as: CRESTOR Take 1 tablet (10 mg total) by mouth daily. What changed: how much to take   senna 8.6 MG Tabs  tablet Commonly known as: SENOKOT Take 1 tablet (8.6 mg total) by mouth 2 (two) times daily.   Vitamin D (Ergocalciferol) 1.25 MG (50000 UNIT) Caps capsule Commonly known as: DRISDOL Take 1 capsule (50,000 Units total) by mouth every 7 (seven) days. Start taking on: November 30, 2021               Durable Medical Equipment  (From admission, onward)           Start     Ordered   11/25/21 1147  For home use only DME Walker rolling  Once       Question Answer Comment  Walker: With 5 Inch Wheels   Patient needs a walker to treat with the following condition General weakness      11/25/21 1148            Follow-up Information     [facility] Follow up.   Why: Rolling Walker- to be delivered to the room prior to transition home. Contact information: [address] [place] [address] [phone]                Discharge Exam: Filed Weights   11/19/21 2045 11/20/21 1326 11/24/21 1354  Weight: 49.4 kg 51.4 kg 51.4 kg   General exam: Appears calm and comfortable   Respiratory system: Clear to auscultation. Respiratory effort normal. Cardiovascular system: S1 & S2 heard, RRR. No JVD, murmurs, rubs, gallops or clicks. No pedal edema. Gastrointestinal system: Abdomen is nondistended, soft and nontender. No organomegaly or masses felt. Normal bowel sounds heard. Central nervous system: Alert and oriented. No focal neurological deficits. Extremities: Symmetric 5 x 5 power. Skin: No rashes, lesions or ulcers Psychiatry: Judgement and insight appear normal. Mood & affect appropriate.    Condition at discharge: fair  The results of significant diagnostics from this hospitalization (including imaging, microbiology, ancillary and laboratory) are listed below for reference.   Imaging Studies: DG THORACOLUMABAR SPINE  Result Date: 11/24/2021 CLINICAL DATA:  L1 kyphoplasty. EXAM: THORACOLUMBAR SPINE 1V COMPARISON:  Radiograph 11/11/2021 FINDINGS: Single fluoroscopic spot image from the OR demonstrates bone cement in the L1 vertebral body. IMPRESSION: Vertebral augmentation changes at L1. Electronically Signed   By: Rudie Meyer M.D.   On: 11/24/2021 15:43   DG C-Arm 1-60 Min-No Report  Result Date: 11/24/2021 Fluoroscopy was utilized by the requesting physician.  No radiographic interpretation.   CT Lumbar Spine Wo Contrast  Result Date: 11/20/2021 CLINICAL DATA:  Low back pain with recent fracture EXAM: CT LUMBAR SPINE WITHOUT CONTRAST TECHNIQUE: Multidetector CT imaging of the lumbar spine was performed without intravenous contrast administration. Multiplanar CT image reconstructions were also generated. RADIATION DOSE REDUCTION: This exam was performed according to the departmental dose-optimization program which includes automated exposure control, adjustment of the mA and/or kV according to patient size and/or use of iterative reconstruction technique. COMPARISON:  10/14/2021 FINDINGS: Segmentation: Partial lumbarization of S1. Numbering is unchanged from  prior study. Alignment: Grade 1 anterolisthesis at L4-5 Vertebrae: Progressive height loss at the fractured L1 level, now approximately 50%. There is now 3 mm of retropulsion of the posterosuperior corner. No new fracture site. Paraspinal and other soft tissues: Calcific aortic atherosclerosis. Disc levels: Multilevel degenerative disc disease without high-grade spinal canal stenosis. Aside from the L1 level, the appearance is unchanged. IMPRESSION: 1. Progressive height loss at the fractured L1 level, now approximately 50%. 3 mm of retropulsion of the posterosuperior corner is new; but there is no spinal canal stenosis.  2. No new fracture site. 3. Aortic Atherosclerosis (ICD10-I70.0). Electronically Signed   By: Ulyses Jarred M.D.   On: 11/20/2021 01:28   CUP PACEART REMOTE DEVICE CHECK  Result Date: 11/16/2021 ILR summary report received. Battery status OK. Normal device function. No new symptom, tachy, brady, or pause episodes. No new AF episodes. AF burden is 0% of the time.  Monthly summary reports and ROV/PRN Kathy Breach, RN, CCDS, [employer] ILR summary report received. Battery status OK. Normal device function. No new symptom, tachy, brady, or pause episodes. No new AF episodes. AF burden is 0% of the time.  Monthly summary reports and ROV/PRN Kathy Breach, RN, CCDS, [employer]   Microbiology: Results for orders placed or performed during the hospital encounter of 11/19/21  Surgical pcr screen     Status: None   Collection Time: 11/24/21  2:12 PM   Specimen: Nasal Mucosa; Nasal Swab  Result Value Ref Range Status   MRSA, PCR NEGATIVE NEGATIVE Final   Staphylococcus aureus NEGATIVE NEGATIVE Final    Comment: (NOTE) The Xpert SA Assay (FDA approved for NASAL specimens in patients 52 years of age and older), is one component of a comprehensive surveillance program. It is not intended to diagnose infection nor to guide or monitor treatment. Performed at [facility], [place] [address], [place], [place] [address]     Labs: CBC: Recent Labs  Lab 11/20/21 0003 11/20/21 0602 11/25/21 0347  WBC 7.9 7.4 7.5  NEUTROABS 4.4 3.7 5.8  HGB 12.8 13.0 12.7  HCT 38.7 40.2 37.7  MCV 92.1 93.9 90.6  PLT 210 212 [id number]   Basic Metabolic Panel: Recent Labs  Lab 11/20/21 0602 11/21/21 0224 11/22/21 0108 11/23/21 0135 11/24/21 0428 11/25/21 0347  NA 139 139 140 141 140 136  K 3.2* 3.5 3.3* 3.7 3.9 4.3  CL 108 107 108 107 107 105  CO2 22 22 23 26 25 24   GLUCOSE 93 105* 106* 101* 107* 139*  BUN 8 12 10 12 9  7*  CREATININE 0.46 0.72 0.69 0.69 0.64 0.61  CALCIUM 8.1* 8.7* 8.6* 8.6* 9.0 9.3  MG 1.9  --   --  1.8  --   --    Liver Function Tests: Recent Labs  Lab 11/20/21 0602  AST 11*  ALT 9  ALKPHOS 53  BILITOT 0.8  PROT 6.5  ALBUMIN 3.2*   CBG: No results for input(s): "GLUCAP" in the last 168 hours.  Discharge time spent: 40  minutes.   Signed: Hosie Poisson, MD [facility] 11/25/2021

## 2021-11-25 NOTE — Evaluation (Signed)
Physical Therapy Evaluation Patient Details Name: Evelyn [name] MRN: [id number] DOB: 1940/08/06 Today's Date: 11/25/2021  History of Present Illness  81 y.o. female presents to [facility] hospital on 11/20/2021 with acute uncontrolled back pain, recently diagnosed with acute L1 compression fx on 10/14/2021. Pt also noted to be in afib with RVR. Pt underwent L1 kyphoplasty on 11/24/2021. PMH includes PAF, CAD, HLD, chronic pain, HTN.  Clinical Impression  Pt presents to PT with deficits in gait, balance, power, endurance, functional mobility, and with continued back pain (although less significant than pre-surgery per pt report). Pt is able to ambulate for household distances at this time, with increased weakness compared to baseline. Pt with slowed gait speed, limiting her ability to mobilize for community distances at this time. Pt will benefit from continued aggressive mobilization in an effort to rebuild strength and aide in a return to independence in ambulation and ADLs. PT recommends a RW at the time of discharge to provide increased stability initially as the pt is often home alone during the day.       Recommendations for follow up therapy are one component of a multi-disciplinary discharge planning process, led by the attending physician.  Recommendations may be updated based on patient status, additional functional criteria and insurance authorization.  Follow Up Recommendations No PT follow up      Assistance Recommended at Discharge Intermittent Supervision/Assistance  Patient can return home with the following  A little help with bathing/dressing/bathroom;Assistance with cooking/housework;Help with stairs or ramp for entrance    Equipment Recommendations Rolling walker (2 wheels)  Recommendations for Other Services       Functional Status Assessment Patient has had a recent decline in their functional status and demonstrates the ability to make significant improvements in function in a  reasonable and predictable amount of time.     Precautions / Restrictions Precautions Precautions: Fall;Back (back for comfort) Precaution Booklet Issued: No Precaution Comments: verbally reviewed back precautions Restrictions Weight Bearing Restrictions: No      Mobility  Bed Mobility Overal bed mobility: Needs Assistance Bed Mobility: Rolling, Sidelying to Sit Rolling: Supervision Sidelying to sit: Supervision       General bed mobility comments: verbal cues for log roll    Transfers Overall transfer level: Needs assistance Equipment used: None Transfers: Sit to/from Stand Sit to Stand: Supervision                Ambulation/Gait Ambulation/Gait assistance: Supervision Gait Distance (Feet): 100 Feet Assistive device: None Gait Pattern/deviations: Step-through pattern, Decreased stride length Gait velocity: reduced Gait velocity interpretation: <1.31 ft/sec, indicative of household ambulator   General Gait Details: pt with slowed step-through gait, reduced stride length. Pt reports slowed gait speed at baseline since initially breaking back  Stairs            Wheelchair Mobility    Modified Rankin (Stroke Patients Only)       Balance Overall balance assessment: Needs assistance Sitting-balance support: No upper extremity supported, Feet supported Sitting balance-Leahy Scale: Good     Standing balance support: No upper extremity supported, During functional activity Standing balance-Leahy Scale: Good                               Pertinent Vitals/Pain Pain Assessment Pain Assessment: Faces Faces Pain Scale: Hurts little more Pain Location: back Pain Descriptors / Indicators: Grimacing Pain Intervention(s): Monitored during session    Home Living Family/patient expects to  be discharged to:: Private residence Living Arrangements: Children Available Help at Discharge: Family;Available PRN/intermittently Type of Home:  House Home Access: Level entry       Home Layout: One level Home Equipment: None      Prior Function Prior Level of Function : Independent/Modified Independent             Mobility Comments: 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        Extremity/Trunk Assessment   Upper Extremity Assessment Upper Extremity Assessment: Overall WFL for tasks assessed    Lower Extremity Assessment Lower Extremity Assessment: Generalized weakness    Cervical / Trunk Assessment Cervical / Trunk Assessment: Back Surgery (kyphoplasty)  Communication   Communication: No difficulties  Cognition Arousal/Alertness: Awake/alert Behavior During Therapy: WFL for tasks assessed/performed Overall Cognitive Status: Within Functional Limits for tasks assessed                                          General Comments General comments (skin integrity, edema, etc.): VSS on RA with mobility, when taking pills at rest HR labile, jumping from 50s to 130s.    Exercises     Assessment/Plan    PT Assessment Patient needs continued PT services  PT Problem List Decreased strength;Decreased activity tolerance;Decreased mobility;Decreased balance;Pain       PT Treatment Interventions DME instruction;Gait training;Functional mobility training;Therapeutic activities;Therapeutic exercise;Balance training;Neuromuscular re-education;Patient/family education    PT Goals (Current goals can be found in the Care Plan section)  Acute Rehab PT Goals Patient Stated Goal: to return to prior level of independence with community mobility PT Goal Formulation: With patient Time For Goal Achievement: 12/09/21 Potential to Achieve Goals: Good    Frequency Min 3X/week     Co-evaluation               AM-PAC PT "6 Clicks" Mobility  Outcome Measure Help needed turning from your back to your side while in a flat bed without using bedrails?: A  Little Help needed moving from lying on your back to sitting on the side of a flat bed without using bedrails?: A Little Help needed moving to and from a bed to a chair (including a wheelchair)?: A Little Help needed standing up from a chair using your arms (e.g., wheelchair or bedside chair)?: A Little Help needed to walk in hospital room?: A Little Help needed climbing 3-5 steps with a railing? : A Little 6 Click Score: 18    End of Session   Activity Tolerance: Patient tolerated treatment well Patient left: in chair;with call bell/phone within reach;with chair alarm set Nurse Communication: Mobility status PT Visit Diagnosis: Other abnormalities of gait and mobility (R26.89);Muscle weakness (generalized) (M62.81)    Time: 2778-2423 PT Time Calculation (min) (ACUTE ONLY): 27 min   Charges:   PT Evaluation $PT Eval Low Complexity: 1 Low          Arlyss Gandy, PT, DPT Acute Rehabilitation Office [phone]   Arlyss Gandy 11/25/2021, 11:10 AM

## 2021-11-25 NOTE — Care Management (Signed)
  Transition of Care [facility]) Screening Note   Patient Details  Name: Evelyn [name] Date of Birth: 06/17/1940   Transition of Care [facility]) CM/SW Contact:    Gala Lewandowsky, RN Phone Number: 11/25/2021, 11:52 AM    Transition of Care Department [facility]) has reviewed the patient. Case Manager reviewed the case and no PT recommendations for home health services at this time. Case Manager did order a rolling walker via [employer] that will be delivered to the room prior to transition home. No further home needs identified at this time.

## 2021-11-25 NOTE — Progress Notes (Signed)
Patient ID: Evelyn [name], female   DOB: 09/10/1940, 81 y.o.   MRN: [id number] Vital signs are stable Patient not having any back pain since procedure yesterday She can be mobilized as tolerated and does not need a brace I will follow her up in 1 month's time. She can have all her anticoagulant medications restarted as needed.

## 2021-11-25 NOTE — Progress Notes (Signed)
ANTICOAGULATION CONSULT NOTE   Pharmacy Consult for apixaban Indication: atrial fibrillation  Allergies  Allergen Reactions   Sulfa Antibiotics Swelling    Patient Measurements: Height: 5\' 2"  (157.5 cm) Weight: 51.4 kg (113 lb 5.1 oz) IBW/kg (Calculated) : 50.1  Vital Signs: Temp: 97.5 F (36.4 C) (07/12 0936) Temp Source: Oral (07/12 0936) BP: 139/67 (07/12 0936) Pulse Rate: 60 (07/12 0936)  Labs: Recent Labs    11/23/21 0135 11/24/21 0428 11/25/21 0347  HGB  --   --  12.7  HCT  --   --  37.7  PLT  --   --  260  CREATININE 0.69 0.64 0.61    Estimated Creatinine Clearance: 42.1 mL/min (by C-G formula based on SCr of 0.61 mg/dL).   Medical History: Past Medical History:  Diagnosis Date   Aneurysm (HCC)    Aortic insufficiency    Atrial fibrillation (HCC)    CAD (coronary artery disease)    CABG LIMA to LAD,vein graft to diagonal,vein graft to OM   Dyslipidemia    GERD (gastroesophageal reflux disease)    Hypothyroid     Medications:  Medications Prior to Admission  Medication Sig Dispense Refill Last Dose   apixaban (ELIQUIS) 2.5 MG TABS tablet Take 1 tablet (2.5 mg total) by mouth 2 (two) times daily. 180 tablet 1 11/19/2021 at AM   diltiazem (CARDIZEM) 30 MG tablet Take 1 tablet (30 mg total) by mouth every 6 (six) hours as needed (for rapid heart rate). 90 tablet 1 11/19/2021   furosemide (LASIX) 20 MG tablet Take 1 tablet (20 mg total) by mouth daily. 90 tablet 1 Past Week   [START ON 11/28/2021] HYDROcodone-acetaminophen (NORCO) 5-325 MG tablet Take 1 tablet by mouth every 6 (six) hours as needed for moderate pain. 120 tablet 0 Past Week   hydrocortisone 2.5 % cream APPLY TO AFFECTED AREA TWICE A DAY (Patient taking differently: Apply 1 Application topically daily as needed (itching).) 28.35 g 6 UNK   isosorbide mononitrate (IMDUR) 60 MG 24 hr tablet TAKE 1 TABLET (60 MG TOTAL) DAILY BY MOUTH. (Patient taking differently: Take 60 mg by mouth daily.) 90 tablet  1 11/19/2021   levothyroxine (SYNTHROID) 75 MCG tablet Take 1 tablet (75 mcg total) by mouth daily before breakfast. 90 tablet 1 11/19/2021   loratadine (CLARITIN) 10 MG tablet TAKE 1 TABLET BY MOUTH EVERY DAY (Patient taking differently: Take 10 mg by mouth daily as needed for allergies.) 90 tablet 3 UNK   methocarbamol (ROBAXIN) 500 MG tablet TAKE 1 TABLET (500 MG TOTAL) BY MOUTH EVERY 12 (TWELVE) HOURS AS NEEDED FOR MUSCLE SPASMS. 20 tablet 0 Past Week   metoprolol tartrate (LOPRESSOR) 100 MG tablet Take 1 tablet (100 mg total) by mouth 2 (two) times daily. 180 tablet 1 11/19/2021 at AM   rosuvastatin (CRESTOR) 10 MG tablet Take 1 tablet (10 mg total) by mouth daily. (Patient taking differently: Take 30 mg by mouth daily.) 90 tablet 1 11/19/2021   Zoledronic Acid (RECLAST IV) Inject into the vein. Once yearly   2022   nitroGLYCERIN (NITROSTAT) 0.4 MG SL tablet PLACE 1 TABLET UNDER THE TONGUE EVERY 5 MINUTES AS NEEDED FOR CHEST PAIN. (Patient taking differently: Place 0.4 mg under the tongue every 5 (five) minutes as needed for chest pain.) 25 tablet 2 UNK   Scheduled:   acetaminophen  650 mg Oral Q6H   Or   acetaminophen  650 mg Rectal Q6H   apixaban  2.5 mg Oral BID   docusate  sodium  100 mg Oral BID   isosorbide mononitrate  60 mg Oral QHS   levothyroxine  75 mcg Oral QAC breakfast   loratadine  10 mg Oral Daily   metoprolol tartrate  100 mg Oral BID   potassium chloride  40 mEq Oral Daily   rosuvastatin  10 mg Oral Daily   senna  1 tablet Oral BID   sodium chloride flush  3 mL Intravenous Q12H   Vitamin D (Ergocalciferol)  50,000 Units Oral Q7 days    Assessment: 81 yo female s/p kyphoplasty on 7/11. She is on apixaban 2.5mg  po bid PTA. Pharmacy consulted to resume anticoagulation -SCr= 0.61, hg= 12.7 -Wt= 51kg  Goal of Therapy:  Monitor platelets by anticoagulation protocol: Yes   Plan:  -resume apixaban 2.5mg  po bid -Will follow patient peripherally  Harland German,  PharmD Clinical Pharmacist **Pharmacist phone directory can now be found on [email or web address] ([email or web address]).  Listed under [facility].

## 2021-11-25 NOTE — Progress Notes (Signed)
Pt safely discharged. Discharge packet provided with teach-back method. VS wnL and as per flow. IVs removed, Pt verbalized understanding. All questions and concerns addressed. Awaiting on equipment delivery for discharge.[name]

## 2021-11-26 ENCOUNTER — Other Ambulatory Visit: Payer: Self-pay | Admitting: *Deleted

## 2021-11-26 ENCOUNTER — Telehealth: Payer: Self-pay

## 2021-11-26 ENCOUNTER — Telehealth: Payer: Self-pay | Admitting: Nurse Practitioner

## 2021-11-26 NOTE — Telephone Encounter (Signed)
Transition Care Management Follow-up Telephone Call Date of discharge and from where: 11/25/21 - [facility] with [facility] How have you been since you were released from the hospital? Feels so much better Any questions or concerns? No  Items Reviewed: Did the pt receive and understand the discharge instructions provided? Yes  Medications obtained and verified? Yes  Other? No  Any new allergies since your discharge? No  Dietary orders reviewed? Yes Do you have support at home? Yes   Home Care and Equipment/Supplies: Were home health services ordered? no  Were any new equipment or medical supplies ordered?  Yes: rolling walker What is the name of the medical supply agency? [facility] Were you able to get the supplies/equipment? yes Do you have any questions related to the use of the equipment or supplies? No  Functional Questionnaire: (I = Independent and D = Dependent) ADLs: I  Bathing/Dressing- I  Meal Prep- I  Eating- I  Maintaining continence- I  Transferring/Ambulation- I  Managing Meds- I  Follow up appointments reviewed:  PCP Hospital f/u appt confirmed? Yes  Scheduled to see Bennie Pierini on 12/03/21 @ 12:15. Specialist Hospital f/u appt confirmed? No  waiting for appt with cardiology Are transportation arrangements needed? No  If their condition worsens, is the pt aware to call PCP or go to the Emergency Dept.? Yes Was the patient provided with contact information for the PCP's office or ED? Yes Was to pt encouraged to call back with questions or concerns? Yes

## 2021-11-26 NOTE — Telephone Encounter (Signed)
Loop recorder reached RRT

## 2021-11-26 NOTE — Telephone Encounter (Signed)
Appt scheduled per patients request ?

## 2021-11-26 NOTE — Patient Outreach (Signed)
  Care Coordination [facility] Note Transition Care Management Follow-up Telephone Call Date of discharge and from where: [phone] [facility] How have you been since you were released from the hospital? For the 1st time I feel like I am living again. I feel the best I have in months Any questions or concerns? Yes Patient wanted to know if if she could drive. RN informed patient do no drive until follow up with Dr.  Items Reviewed: Did the pt receive and understand the discharge instructions provided? Yes  Medications obtained and verified? Yes  Other? No  Any new allergies since your discharge? No  Dietary orders reviewed? No Do you have support at home? Yes  daughter  Home Care and Equipment/Supplies: Were home health services ordered?no If so, what is the name of the agency? N/A  Has the agency set up a time to come to the patient's home? N/A Were any new equipment or medical supplies ordered?  Yes:   What is the name of the medical supply agency? [facility] Were you able to get the supplies/equipment? Yes Patient received walker at time of discharge Do you have any questions related to the use of the equipment or supplies?N  Functional Questionnaire: (I = Independent and D = Dependent) ADLs: Independent  Bathing/Dressing-  Independent  Meal Prep-  Independent  Eating-  Independent  Maintaining continence-  Independent  Transferring/Ambulation-  Independent  Managing Meds-  Independent  Follow up appointments reviewed:   PCP Hospital f/u appt confirmed? Patient is calling to schedule self as soon as hang up. Specialist Hospital f/u appt confirmed?No Are transportation arrangements needed? No  If their condition worsens, is the pt aware to call PCP or go to the Emergency Dept.? Yes Was the patient provided with contact information for the PCP's office or ED? Yes Was to pt encouraged to call back with questions or concerns? Yes  SDOH assessments and interventions completed:    y  Care Coordination Interventions Activated:  No Care Coordination Interventions:   N/A  Encounter Outcome:  Pt. Visit Completed

## 2021-11-27 NOTE — Telephone Encounter (Signed)
Advised patient her ILR has reached RRT and no longer working. Discussed options to leave device in or have explanted. States she just broke her back and will call back at later time if she would like to have it removed. Will send return kit.

## 2021-11-30 ENCOUNTER — Emergency Department (HOSPITAL_COMMUNITY)
Admission: EM | Admit: 2021-11-30 | Discharge: 2021-11-30 | Disposition: A | Discharge status: Home / Self Care | Payer: Medicare HMO | Attending: Emergency Medicine | Admitting: Emergency Medicine

## 2021-11-30 ENCOUNTER — Encounter (HOSPITAL_COMMUNITY): Payer: Self-pay

## 2021-11-30 ENCOUNTER — Other Ambulatory Visit: Payer: Self-pay

## 2021-11-30 ENCOUNTER — Emergency Department (HOSPITAL_COMMUNITY): Payer: Medicare HMO

## 2021-11-30 DIAGNOSIS — Z7901 Long term (current) use of anticoagulants: Secondary | ICD-10-CM | POA: Diagnosis not present

## 2021-11-30 DIAGNOSIS — I2510 Atherosclerotic heart disease of native coronary artery without angina pectoris: Secondary | ICD-10-CM | POA: Diagnosis not present

## 2021-11-30 DIAGNOSIS — Z951 Presence of aortocoronary bypass graft: Secondary | ICD-10-CM | POA: Diagnosis not present

## 2021-11-30 DIAGNOSIS — I480 Paroxysmal atrial fibrillation: Secondary | ICD-10-CM | POA: Insufficient documentation

## 2021-11-30 DIAGNOSIS — R079 Chest pain, unspecified: Secondary | ICD-10-CM | POA: Diagnosis present

## 2021-11-30 DIAGNOSIS — R0789 Other chest pain: Secondary | ICD-10-CM | POA: Insufficient documentation

## 2021-11-30 DIAGNOSIS — R002 Palpitations: Secondary | ICD-10-CM | POA: Diagnosis not present

## 2021-11-30 LAB — CBC WITH DIFFERENTIAL/PLATELET
Abs Immature Granulocytes: 0.07 10*3/uL (ref 0.00–0.07)
Basophils Absolute: 0.1 10*3/uL (ref 0.0–0.1)
Basophils Relative: 1 %
Eosinophils Absolute: 0.1 10*3/uL (ref 0.0–0.5)
Eosinophils Relative: 2 %
HCT: 38 % (ref 36.0–46.0)
Hemoglobin: 12.4 g/dL (ref 12.0–15.0)
Immature Granulocytes: 1 %
Lymphocytes Relative: 23 %
Lymphs Abs: 2.2 10*3/uL (ref 0.7–4.0)
MCH: 30.8 pg (ref 26.0–34.0)
MCHC: 32.6 g/dL (ref 30.0–36.0)
MCV: 94.5 fL (ref 80.0–100.0)
Monocytes Absolute: 0.8 10*3/uL (ref 0.1–1.0)
Monocytes Relative: 9 %
Neutro Abs: 6.2 10*3/uL (ref 1.7–7.7)
Neutrophils Relative %: 64 %
Platelets: 268 10*3/uL (ref 150–400)
RBC: 4.02 MIL/uL (ref 3.87–5.11)
RDW: 16 % — ABNORMAL HIGH (ref 11.5–15.5)
WBC: 9.5 10*3/uL (ref 4.0–10.5)
nRBC: 0 % (ref 0.0–0.2)

## 2021-11-30 LAB — MAGNESIUM: Magnesium: 1.7 mg/dL (ref 1.7–2.4)

## 2021-11-30 LAB — COMPREHENSIVE METABOLIC PANEL
ALT: 12 U/L (ref 0–44)
AST: 13 U/L — ABNORMAL LOW (ref 15–41)
Albumin: 3 g/dL — ABNORMAL LOW (ref 3.5–5.0)
Alkaline Phosphatase: 46 U/L (ref 38–126)
Anion gap: 12 (ref 5–15)
BUN: 7 mg/dL — ABNORMAL LOW (ref 8–23)
CO2: 16 mmol/L — ABNORMAL LOW (ref 22–32)
Calcium: 8 mg/dL — ABNORMAL LOW (ref 8.9–10.3)
Chloride: 112 mmol/L — ABNORMAL HIGH (ref 98–111)
Creatinine, Ser: 0.66 mg/dL (ref 0.44–1.00)
GFR, Estimated: >60 mL/min (ref >60)
Glucose, Bld: 101 mg/dL — ABNORMAL HIGH (ref 70–99)
Potassium: 3.5 mmol/L (ref 3.5–5.1)
Sodium: 140 mmol/L (ref 135–145)
Total Bilirubin: 0.6 mg/dL (ref 0.3–1.2)
Total Protein: 5.5 g/dL — ABNORMAL LOW (ref 6.5–8.1)

## 2021-11-30 LAB — TROPONIN I (HIGH SENSITIVITY)
Troponin I (High Sensitivity): 11 ng/L (ref <18)
Troponin I (High Sensitivity): 14 ng/L (ref <18)

## 2021-11-30 LAB — BRAIN NATRIURETIC PEPTIDE: B Natriuretic Peptide: 86.9 pg/mL (ref 0.0–100.0)

## 2021-11-30 LAB — PROTIME-INR
INR: 1.2 (ref 0.8–1.2)
Prothrombin Time: 15.5 seconds — ABNORMAL HIGH (ref 11.4–15.2)

## 2021-11-30 MED ORDER — LACTATED RINGERS IV BOLUS
1000.0000 mL | Freq: Once | INTRAVENOUS | Status: AC
  Administered 2021-11-30: 1000 mL via INTRAVENOUS

## 2021-11-30 NOTE — Discharge Instructions (Signed)
Please follow-up with your primary care doctor and your cardiologist to discuss your episode today.  If you have any recurrent chest pain, palpitations, or other new concerning symptom, please return to ER for reassessment.  For now, continue taking your previously prescribed medication.  Discussed her medication regimen with your cardiologist and primary care doctor.

## 2021-11-30 NOTE — ED Provider Notes (Addendum)
[facility] EMERGENCY DEPARTMENT Provider Note   CSN: [id number] Arrival date & time: 11/30/21  0743     History  Chief Complaint  Patient presents with   Chest Pain    Evelyn [name] is a 81 y.o. female.  Presented to the emergency room due to concern for chest pain, palpitations.  Patient states yesterday when she went to bed she was having no symptoms.  This morning when she woke up she had heart racing sensation and some chest discomfort.  She states that this was relatively constant until she got to the emergency room at which point it stopped.  She reports a history of atrial fibrillation, reports that she took her home metoprolol morning dose.  She states that the heart palpitations and chest discomfort have completely stopped.  She has no ongoing symptoms right now.  She did not have any associated shortness of breath.  Patient reports that her back pain has been doing much better since getting the procedure during her last admission.  Reviewed discharge summary from 7/12 from Dr. Blake Divine, admitted for compression fracture of L1, atrial fibrillation.  HPI     Home Medications Prior to Admission medications   Medication Sig Start Date End Date Taking? Authorizing Provider  apixaban (ELIQUIS) 2.5 MG TABS tablet Take 1 tablet (2.5 mg total) by mouth 2 (two) times daily. 09/29/21   Daphine Deutscher, Mary-Margaret, FNP  docusate sodium (COLACE) 100 MG capsule Take 1 capsule (100 mg total) by mouth 2 (two) times daily. 11/25/21   Kathlen Mody, MD  furosemide (LASIX) 20 MG tablet Take 1 tablet (20 mg total) by mouth daily. 09/29/21   Daphine Deutscher Mary-Margaret, FNP  HYDROcodone-acetaminophen (NORCO) 5-325 MG tablet Take 1 tablet by mouth every 6 (six) hours as needed for moderate pain. 11/28/21 12/28/21  Daphine Deutscher, Mary-Margaret, FNP  hydrocortisone 2.5 % cream APPLY TO AFFECTED AREA TWICE A DAY Patient taking differently: Apply 1 Application topically daily as needed (itching). 11/13/20    Daphine Deutscher, Mary-Margaret, FNP  isosorbide mononitrate (IMDUR) 60 MG 24 hr tablet TAKE 1 TABLET (60 MG TOTAL) DAILY BY MOUTH. Patient taking differently: Take 60 mg by mouth daily. 09/29/21   Daphine Deutscher Mary-Margaret, FNP  levothyroxine (SYNTHROID) 75 MCG tablet Take 1 tablet (75 mcg total) by mouth daily before breakfast. 09/29/21   Daphine Deutscher, Mary-Margaret, FNP  loratadine (CLARITIN) 10 MG tablet TAKE 1 TABLET BY MOUTH EVERY DAY Patient taking differently: Take 10 mg by mouth daily as needed for allergies. 11/13/20   Daphine Deutscher, Mary-Margaret, FNP  methocarbamol (ROBAXIN) 500 MG tablet TAKE 1 TABLET (500 MG TOTAL) BY MOUTH EVERY 12 (TWELVE) HOURS AS NEEDED FOR MUSCLE SPASMS. 11/03/21   Daphine Deutscher Mary-Margaret, FNP  metoprolol tartrate (LOPRESSOR) 100 MG tablet Take 1 tablet (100 mg total) by mouth 2 (two) times daily. 09/29/21   Daphine Deutscher, Mary-Margaret, FNP  nitroGLYCERIN (NITROSTAT) 0.4 MG SL tablet PLACE 1 TABLET UNDER THE TONGUE EVERY 5 MINUTES AS NEEDED FOR CHEST PAIN. Patient taking differently: Place 0.4 mg under the tongue every 5 (five) minutes as needed for chest pain. 06/12/21   Camnitz, Andree Coss, MD  polyethylene glycol (MIRALAX / GLYCOLAX) 17 g packet Take 17 g by mouth daily as needed for mild constipation. 11/25/21   Kathlen Mody, MD  rosuvastatin (CRESTOR) 10 MG tablet Take 1 tablet (10 mg total) by mouth daily. Patient taking differently: Take 30 mg by mouth daily. 09/29/21   Daphine Deutscher, Mary-Margaret, FNP  senna (SENOKOT) 8.6 MG TABS tablet Take 1 tablet (8.6 mg  total) by mouth 2 (two) times daily. 11/25/21   Hosie Poisson, MD  Vitamin D, Ergocalciferol, (DRISDOL) 1.25 MG (50000 UNIT) CAPS capsule Take 1 capsule (50,000 Units total) by mouth every 7 (seven) days. 11/30/21   Hosie Poisson, MD  Zoledronic Acid (RECLAST IV) Inject into the vein. Once yearly    [provider]      Allergies    Sulfa antibiotics    Review of Systems   Review of Systems  Constitutional:  Negative for chills and  fever.  HENT:  Negative for ear pain and sore throat.   Eyes:  Negative for pain and visual disturbance.  Respiratory:  Positive for chest tightness. Negative for cough and shortness of breath.   Cardiovascular:  Positive for chest pain and palpitations.  Gastrointestinal:  Negative for abdominal pain and vomiting.  Genitourinary:  Negative for dysuria and hematuria.  Musculoskeletal:  Negative for arthralgias and back pain.  Skin:  Negative for color change and rash.  Neurological:  Negative for seizures and syncope.  All other systems reviewed and are negative.   Physical Exam Updated Vital Signs BP (!) 128/58   Pulse 62   Temp (!) 97.5 F (36.4 C) (Oral)   Resp (!) 22   SpO2 97%  Physical Exam Vitals and nursing note reviewed.  Constitutional:      General: She is not in acute distress.    Appearance: She is well-developed.  HENT:     Head: Normocephalic and atraumatic.  Eyes:     Conjunctiva/sclera: Conjunctivae normal.  Cardiovascular:     Rate and Rhythm: Normal rate and regular rhythm.     Heart sounds: No murmur heard. Pulmonary:     Effort: Pulmonary effort is normal. No respiratory distress.     Breath sounds: Normal breath sounds.  Abdominal:     Palpations: Abdomen is soft.     Tenderness: There is no abdominal tenderness.  Musculoskeletal:        General: No swelling.     Cervical back: Neck supple.  Skin:    General: Skin is warm and dry.     Capillary Refill: Capillary refill takes less than 2 seconds.  Neurological:     Mental Status: She is alert.  Psychiatric:        Mood and Affect: Mood normal.     ED Results / Procedures / Treatments   Labs (all labs ordered are listed, but only abnormal results are displayed) Labs Reviewed  CBC WITH DIFFERENTIAL/PLATELET - Abnormal; Notable for the following components:      Result Value   RDW 16.0 (*)    All other components within normal limits  COMPREHENSIVE METABOLIC PANEL - Abnormal; Notable for  the following components:   Chloride 112 (*)    CO2 16 (*)    Glucose, Bld 101 (*)    BUN 7 (*)    Calcium 8.0 (*)    Total Protein 5.5 (*)    Albumin 3.0 (*)    AST 13 (*)    All other components within normal limits  PROTIME-INR - Abnormal; Notable for the following components:   Prothrombin Time 15.5 (*)    All other components within normal limits  MAGNESIUM  BRAIN NATRIURETIC PEPTIDE  TROPONIN I (HIGH SENSITIVITY)  TROPONIN I (HIGH SENSITIVITY)    EKG EKG Interpretation  Date/Time:  Monday November 30 2021 08:18:58 EDT Ventricular Rate:  56 PR Interval:  146 QRS Duration: 105 QT Interval:  433 QTC Calculation: 418  R Axis:   31 Text Interpretation: Sinus rhythm Probable left atrial enlargement Abnormal T, consider ischemia, anterior leads when compared to prior, sinus rhythm has replaced afib Confirmed by Marianna Fuss ([id number]) on 11/30/2021 10:50:52 AM  Radiology DG Chest Portable 1 View  Result Date: 11/30/2021 CLINICAL DATA:  Chest pain EXAM: PORTABLE CHEST 1 VIEW COMPARISON:  08/03/2019 FINDINGS: Cardiac size is within normal limits. Coronary artery calcifications and coronary artery stent are noted. There is previous coronary bypass surgery. Lung fields are clear of any infiltrates or pulmonary edema. There is no pleural effusion or pneumothorax. There is kyphoplasty in 1 of the vertebral bodies at the thoracolumbar junction. There is surgical fusion in cervical spine. IMPRESSION: There are no new infiltrates or signs of pulmonary edema. Electronically Signed   By: Ernie Avena M.D.   On: 11/30/2021 08:21    Procedures Procedures    Medications Ordered in ED Medications  lactated ringers bolus 1,000 mL (0 mLs Intravenous Stopped 11/30/21 1220)    ED Course/ Medical Decision Making/ A&P                           Medical Decision Making Amount and/or Complexity of Data Reviewed Labs: ordered. Radiology: ordered.   81 year old lady presented to ER due  to concern for chest pain/palpitations.  Has history of CAD, A-fib.  Prior to arrival received single dose of Cardizem IV by EMS.  Also prior to arrival this morning patient took home dose of metoprolol p.o.  On arrival here patient was found to be in A-fib with RVR.  Without any further intervention, patient converted to sinus rhythm while on her monitor.  I reviewed the cardiac monitor, initially showing A-fib then conversion to sinus rhythm.  Patient's symptoms completely resolved upon conversion to sinus.  Check basic labs.  Reviewed CBC, electrolytes.  No electrolyte derangement, no anemia except did note somewhat low bicarbonate level.  Patient is well-appearing without fever and no distress at this time.  Suspect may be from dehydration.  We will go ahead and give some fluids.  Repeat troponin is stable.  Doubt ACS.  Patient remains in sinus rhythm and asymptomatic.  Repeat BP is stable.  Suspect the transiently mildly low BP was either from the Cardizem or dehydration.  Given no ongoing symptoms and normal vital signs and work-up completed today feel patient can be discharged and does not require admission to the hospital at this time.  I recommended patient follow-up closely with her primary care doctor and her cardiologist to discuss long-term medication management.  I reviewed strict return precautions should she had any recurrence of her rapid A-fib, chest pain or palpitations or other new concerning symptom.  After the discussed management above, the patient was determined to be safe for discharge.  The patient was in agreement with this plan and all questions regarding their care were answered.  ED return precautions were discussed and the patient will return to the ED with any significant worsening of condition.   ADDENDUM for billing - in response to billing inquiry regarding BNP lab - this was ordered due to chest pain, palpitations and concern for possibility of heart failure. The bnp level  was normal and pt not in acute heart failure state.   Final Clinical Impression(s) / ED Diagnoses Final diagnoses:  Chest pain, unspecified type  Paroxysmal atrial fibrillation (HCC)    Rx / DC Orders ED Discharge Orders  Ordered    Ambulatory referral to Cardiology        11/30/21 1239              Lucrezia Starch, MD 11/30/21 1247    Lucrezia Starch, MD 01/13/22 [phone]

## 2021-11-30 NOTE — ED Triage Notes (Addendum)
Pt BIB [facility] from home d/t CP that started this morning. She was d/c last Tuesday (11/24/21) from having back surgery & thought her pain was originating from that but she decided to call EMS & then took 1 Nitro. EMS reports her rate was from 160-175 bpm, A/Ox4, CBG 136, takes Eliquis, 18g PIV in Rt AC. She was given 12mg  Cardizem & her rate decreased to a range of 77-118 bpm, & she was not given ASA. Pt report she used to be on cardizem but had been taken off recently.

## 2021-11-30 NOTE — Progress Notes (Signed)
Patient came into the office today to have IV removed.  Patient was seen at [facility] ED this morning and IV was not removed prior to her discharge. IV was removed at our office - guaze and coban used to apply pressure; advised the patient to leave on 30 mins as she is on blood thinner.

## 2021-12-01 ENCOUNTER — Telehealth: Payer: Self-pay | Admitting: Cardiovascular Disease

## 2021-12-01 NOTE — Telephone Encounter (Signed)
Pt c/o medication issue:  1. Name of Medication: Cardizem  2. How are you currently taking this medication (dosage and times per day)? NA  3. Are you having a reaction (difficulty breathing--STAT)? No  4. What is your medication issue? Pt states that while in the hospital they discontinued medication. Pt states that medication was helping and would like to know if she should be back on it. Please advise

## 2021-12-01 NOTE — Telephone Encounter (Signed)
The diltiazem should not have been stopped. She can continue taking it as needed.

## 2021-12-01 NOTE — Telephone Encounter (Signed)
Patient reports recent hospitalization for her back and had an episode of afib.   Prior to hospital stay she had diltiazem to take as needed when she had episode of afib.  She was told to stop this on discharge.   She was wondering if she can continue to take this only when needed as she was previously.     Routed to Dr. Royann Shivers to review.   Patient has follow up scheduled 7/27 with Dr. Royann Shivers.

## 2021-12-02 MED ORDER — DILTIAZEM HCL 30 MG PO TABS: 30.0000 mg | ORAL_TABLET | Freq: Four times a day (QID) | ORAL | 3 refills | Status: DC | PRN

## 2021-12-02 NOTE — Telephone Encounter (Signed)
Patient aware and verbalized understanding.   Med list updated, patient has some at home and did not need a new prescription.

## 2021-12-03 ENCOUNTER — Ambulatory Visit: Payer: Medicare HMO | Admitting: Nurse Practitioner

## 2021-12-10 ENCOUNTER — Ambulatory Visit: Payer: Medicare HMO | Admitting: Cardiovascular Disease

## 2021-12-10 ENCOUNTER — Encounter: Payer: Self-pay | Admitting: Nurse Practitioner

## 2021-12-10 ENCOUNTER — Ambulatory Visit: Payer: Medicare HMO | Admitting: Nurse Practitioner

## 2021-12-10 ENCOUNTER — Encounter: Payer: Self-pay | Admitting: Cardiovascular Disease

## 2021-12-10 VITALS — BP 110/64 | HR 49 | Ht 62.0 in | Wt 110.8 lb

## 2021-12-10 VITALS — BP 104/63 | HR 52 | Temp 97.8°F | Resp 20 | Ht 62.0 in | Wt 110.0 lb

## 2021-12-10 DIAGNOSIS — Z7689 Persons encountering health services in other specified circumstances: Secondary | ICD-10-CM

## 2021-12-10 DIAGNOSIS — D6869 Other thrombophilia: Secondary | ICD-10-CM | POA: Diagnosis not present

## 2021-12-10 DIAGNOSIS — I7121 Aneurysm of the ascending aorta, without rupture: Secondary | ICD-10-CM

## 2021-12-10 DIAGNOSIS — E039 Hypothyroidism, unspecified: Secondary | ICD-10-CM

## 2021-12-10 DIAGNOSIS — Z8673 Personal history of transient ischemic attack (TIA), and cerebral infarction without residual deficits: Secondary | ICD-10-CM | POA: Diagnosis not present

## 2021-12-10 DIAGNOSIS — I25728 Atherosclerosis of autologous artery coronary artery bypass graft(s) with other forms of angina pectoris: Secondary | ICD-10-CM

## 2021-12-10 DIAGNOSIS — I480 Paroxysmal atrial fibrillation: Secondary | ICD-10-CM | POA: Diagnosis not present

## 2021-12-10 DIAGNOSIS — E7800 Pure hypercholesterolemia, unspecified: Secondary | ICD-10-CM

## 2021-12-10 DIAGNOSIS — I351 Nonrheumatic aortic (valve) insufficiency: Secondary | ICD-10-CM

## 2021-12-10 DIAGNOSIS — I10 Essential (primary) hypertension: Secondary | ICD-10-CM

## 2021-12-10 MED ORDER — POTASSIUM CHLORIDE ER 10 MEQ PO TBCR: 10.0000 meq | EXTENDED_RELEASE_TABLET | Freq: Every day | ORAL | 3 refills | Status: DC

## 2021-12-10 NOTE — Progress Notes (Signed)
Subjective:    Patient ID: Evelyn [name], female    DOB: [birth year], 81 y.o.   MRN: [id number]   Chief Complaint: Transition of care  Today's visit was for Transitional Care Management.  The patient was discharged from [facility] on 11/25/21  with a primary diagnosis of atrial fib.   Contact with the patient and/or caregiver, by a clinical staff member, was made on 11/26/21 and was documented as a telephone encounter within the EMR.  Through chart review and discussion with the patient I have determined that management of their condition is of high complexity.   Patient was discharged on 11/26/21. She was treated with IV diltiazem and rate improved.they were holding her eliquis because she was scheduled for a kyphoplasty. She returned back to the ED on 11/30/21 with chest pain and heart racing. By the time she got to the ED it had resolves. She was discharged home with no medication changes. She has started back on her eliquis and is on metoprolol, and cardizem. SH ehas had no more episodes of heart racing since the 17. Today she feels much better. Her back is batter and she has had no episodes of heart racing since 11/30/21.   Has follow up appointment with cardiology this afternoon.   Review of Systems  Constitutional:  Negative for diaphoresis.  Eyes:  Negative for pain.  Respiratory:  Negative for shortness of breath.   Cardiovascular:  Negative for chest pain, palpitations and leg swelling.  Gastrointestinal:  Negative for abdominal pain.  Endocrine: Negative for polydipsia.  Skin:  Negative for rash.  Neurological:  Negative for dizziness, weakness and headaches.  Hematological:  Does not bruise/bleed easily.  All other systems reviewed and are negative.      Objective:   Physical Exam Vitals and nursing note reviewed.  Constitutional:      General: She is not in acute distress.    Appearance: Normal appearance. She is well-developed.  Neck:     Vascular: No carotid  bruit or JVD.  Cardiovascular:     Rate and Rhythm: Normal rate and regular rhythm.     Heart sounds: Normal heart sounds.  Pulmonary:     Effort: Pulmonary effort is normal. No respiratory distress.     Breath sounds: Normal breath sounds. No wheezing or rales.  Chest:     Chest wall: No tenderness.  Abdominal:     General: Bowel sounds are normal. There is no distension or abdominal bruit.     Palpations: Abdomen is soft. There is no hepatomegaly, splenomegaly, mass or pulsatile mass.     Tenderness: There is no abdominal tenderness.  Musculoskeletal:        General: Normal range of motion.     Cervical back: Normal range of motion and neck supple.     Comments: Rises slowly from sitting to standing No pain with movement currently  Lymphadenopathy:     Cervical: No cervical adenopathy.  Skin:    General: Skin is warm and dry.  Neurological:     Mental Status: She is alert and oriented to person, place, and time.     Deep Tendon Reflexes: Reflexes are normal and symmetric.  Psychiatric:        Behavior: Behavior normal.        Thought Content: Thought content normal.        Judgment: Judgment normal.    BP 104/63   Pulse (!) 52   Temp 97.8 F (36.6 C) (Temporal)  Resp 20   Ht 5\' 2"  (1.575 m)   Wt 110 lb (49.9 kg)   SpO2 96%   BMI 20.12 kg/m         Assessment & Plan:   in today with chief complaint of Medical Management of Chronic Issues   1. Encounter for support and coordination of transition of care Hospital records reveiwed  2. Paroxysmal atrial fibrillation (HCC) Currently in sinus rhythym Keep follow up with cardiology this afternoon  Follow up prn    The above assessment and management plan was discussed with the patient. The patient verbalized understanding of and has agreed to the management plan. Patient is aware to call the clinic if symptoms persist or worsen. Patient is aware when to return to the clinic for a follow-up  visit. Patient educated on when it is appropriate to go to the emergency department.   Mary-Margaret [name], FNP

## 2021-12-10 NOTE — Progress Notes (Signed)
Cardiology Office Note    Date:  12/12/2021   ID:  Evelyn [name], DOB 1941/05/06, MRN [id number]  PCP:  Bennie Pierini, FNP  Cardiologist:   Thurmon Fair, MD   Chief Complaint  Patient presents with   Atrial Fibrillation    History of Present Illness:  Evelyn [name] is a 81 y.o. female with long-standing history of coronary artery disease with previous stents to LAD followed by bypass surgery (LIMA to LAD, SVG to diagonal, SVG to OM in 2004), hyperlipidemia, recent TIA which led to implantation of a loop recorder that promptly demonstrated the presence of asymptomatic paroxysmal atrial fibrillation with rapid ventricular response, mild ascending aortic aneurysm, aortic insufficiency, hypertension, hyperlipidemia.  She had a compression fracture of L1 at the end of May.  This happened while she was simply trying to lift a medium weight.  She suffered with painful for 5 weeks until she had cement vertebroplasty with immediate relief of her complaints on 11/19/2021, hospitalized until 11/25/2021.  She had atrial fibrillation and required a brief infusion of intravenous diltiazem for RVR during that hospitalization.    On 11/30/2021 she was seen in the emergency room with chest pain in the setting of atrial fibrillation with very rapid ventricular response.  In the past she would deal with such an event with an extra dose of immediate release diltiazem, but for some reason she was told to stop this medication during her previous hospitalization.  She was seen in the emergency room, but converted spontaneously to normal rhythm with immediate relief of her symptoms.  High-sensitivity troponin was normal as was the BNP.  He has not had any further neurological events to suggest TIA.  She has rare episodes of chest discomfort that resolved promptly and spontaneously.  They generally occur at rest without an obvious precipitant.  She has not taken nitroglycerin.  She denies orthopnea,  PND or exertional dyspnea.  She does not have lower extremity edema or claudication.  Since that most recent event she has done well and has not had any new episodes of rapid palpitations or chest pain.  Her back feels much better.  She is back to her usual daily activities.  She denies shortness of breath or chest pain with household chores.  She does not have orthopnea, PND or edema and has not experienced syncope.  Recent TSH checked on 11/20/2021 was elevated at 17.071, but with normal free T4 and T3 uptake.  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  Electrolytes have been normal.  Most recent LDL cholesterol was 48 last November.  She had a previous episode suggesting TIA in May 2018.  She had a CT angiogram that did not show any evidence of meaningful extracranial carotid atherosclerosis or stenoses.  She also had an echocardiogram with a bubble study in May 2018 without any evidence of intracardiac shunt.  She had normal carotid ultrasound in 2014.  Angiography in October 2018 showed virtually no change in coronary anatomy since 2013.  She has a chronically occluded LAD artery with multiple occluded stents, but has good downstream blood flow via LIMA-LAD and SVG-diagonal bypasses.  There is probably a significant area of myocardial septum that is ischemic, upstream of the blocked mid LAD.  There was no mention of a SVG bypass to the oblique marginal branch, but the left circumflex coronary artery was widely patent with no more than 20% stenosis (Chest CTA showed that the SVG-OM is now occluded).  Left ventricular regional wall motion and overall systolic  function remain normal and the LVEDP was only 14 mmHg. Although the chest CT angiogram suggested there was a 70% stenosis in the left main coronary artery, that potentially could lead to ischemia in the circumflex territory, this was not found to be the case by invasive angiography.  Had an echocardiogram in May 2018, showing normal LVEF 55-60%, with  inferior and inferoseptal mild hypokinesis, mildly dilated ascending aorta, mild aortic insufficiency. In September 2017 her treadmill nuclear stress test showed normal myocardial perfusion and EF 62%. In October 2017 she had the last evaluation of her aorta, showing mild aneurysmal dilation of the ascending aorta at 3.7 cm and of the aortic arch at 3.3 cm. (She is a very small frame lady, 5 foot 2 inches tall, 112 pounds).  CT angiography of the aorta October 2018 showed unchanged aortic root dimension at 3.3 cm.  The SVG to OM was described as occluded.  She has long-standing history of coronary disease, undergoing her first cardiac catheterization in 1999. She received stents to the LAD artery in 2002 and had to have cutting balloon atherectomy for in-stent restenosis in 2003 followed by a new LAD stent in 2004 for disease progression. Ultimately after again developing restenosis she underwent coronary bypass surgery (LIMA to LAD, SVG to diagonal, SVG to OM in 2004). She had cardiac catheterization in July 2013 and October 2018, without evidence of significant progression. This demonstrated interval total occlusion of the proximal LAD artery although the distant bypasses are all widely patent. On her stress test in 2013 and 2017  the perfusion images were "normal". It is felt that she may have occasional angina pectoris related to ischemia in the proximal septum since there is no retrograde flow from the bypasses to the proximal LAD. Her last echo in May 2018 showed normal EF 55-60%, but reported inferior and inferoseptal mild hypokinesis. 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  CT angiogram of the head and neck performed in May 2018 for TIA showed mild extracranial atherosclerosis. She has treated systemic hypertension and hyperlipidemia and hypothyroidism.  She had previous cervical spine surgery with bone grafting and discectomy in 2009.    Past  Medical History:  Diagnosis Date   Aneurysm ([place])    Aortic insufficiency    Atrial fibrillation (HCC)    CAD (coronary artery disease)    CABG LIMA to LAD,vein graft to diagonal,vein graft to OM   Dyslipidemia    GERD (gastroesophageal reflux disease)    Hypothyroid     Past Surgical History:  Procedure Laterality Date   ABDOMINAL HYSTERECTOMY     BACK SURGERY  2009   CORONARY ANGIOPLASTY  2003   cutting balloon atherectomy in-stent stenosis of the LAD.   CORONARY ANGIOPLASTY WITH STENT PLACEMENT  2002   remote PCI & stenting LAD,   CORONARY ARTERY BYPASS GRAFT  2004   LIMA to LAD,vein graft to diagonal,vein graft to OM   FOOT SURGERY     KYPHOPLASTY N/A 11/24/2021   Procedure: LUMBAR ONE KYPHOPLASTY;  Surgeon: Kristeen Miss, MD;  Location: [place];  Service: Neurosurgery;  Laterality: N/A;  LUMBAR ONE KYPHOPLASTY   LEFT HEART CATH AND CORS/GRAFTS ANGIOGRAPHY N/A 02/23/2017   Procedure: LEFT HEART CATH AND CORS/GRAFTS ANGIOGRAPHY;  Surgeon: Wellington Hampshire, MD;  Location: [place] [facility];  Service: Cardiovascular;  Laterality: N/A;   LEFT HEART CATHETERIZATION WITH CORONARY ANGIOGRAM N/A 12/15/2011   Procedure: LEFT HEART CATHETERIZATION WITH CORONARY  ANGIOGRAM;  Surgeon: Thurmon Fair, MD;  Location: [facility] CATH LAB;  Service: Cardiovascular;  Laterality: N/A;   LOOP RECORDER INSERTION N/A 06/14/2018   Procedure: LOOP RECORDER INSERTION;  Surgeon: Thurmon Fair, MD;  Location: [facility] INVASIVE [facility];  Service: Cardiovascular;  Laterality: N/A;   NECK SURGERY     NM MYOCAR PERF WALL MOTION  12/01/2011   normal study-persistent exercise induced ECG changes raises the concern for ischemia (balanced ischemia).   NOSE SURGERY     PACEMAKER INSERTION     TONSILLECTOMY  1948   TUBAL LIGATION      Current Medications: Outpatient Medications Prior to Visit  Medication Sig Dispense Refill   apixaban (ELIQUIS) 2.5 MG TABS tablet Take 1 tablet (2.5 mg total) by mouth 2 (two) times  daily. 180 tablet 1   diltiazem (CARDIZEM) 30 MG tablet Take 1 tablet (30 mg total) by mouth every 6 (six) hours as needed (for rapid heart rate). 30 tablet 3   docusate sodium (COLACE) 100 MG capsule Take 1 capsule (100 mg total) by mouth 2 (two) times daily. 10 capsule 0   furosemide (LASIX) 20 MG tablet Take 1 tablet (20 mg total) by mouth daily. 90 tablet 1   HYDROcodone-acetaminophen (NORCO) 5-325 MG tablet Take 1 tablet by mouth every 6 (six) hours as needed for moderate pain. 120 tablet 0   hydrocortisone 2.5 % cream APPLY TO AFFECTED AREA TWICE A DAY (Patient taking differently: Apply 1 Application topically daily as needed (itching).) 28.35 g 6   isosorbide mononitrate (IMDUR) 60 MG 24 hr tablet TAKE 1 TABLET (60 MG TOTAL) DAILY BY MOUTH. (Patient taking differently: Take 60 mg by mouth daily.) 90 tablet 1   levothyroxine (SYNTHROID) 75 MCG tablet Take 1 tablet (75 mcg total) by mouth daily before breakfast. 90 tablet 1   methocarbamol (ROBAXIN) 500 MG tablet TAKE 1 TABLET (500 MG TOTAL) BY MOUTH EVERY 12 (TWELVE) HOURS AS NEEDED FOR MUSCLE SPASMS. 20 tablet 0   metoprolol tartrate (LOPRESSOR) 100 MG tablet Take 1 tablet (100 mg total) by mouth 2 (two) times daily. 180 tablet 1   rosuvastatin (CRESTOR) 10 MG tablet Take 1 tablet (10 mg total) by mouth daily. (Patient taking differently: Take 30 mg by mouth daily.) 90 tablet 1   Vitamin D, Ergocalciferol, (DRISDOL) 1.25 MG (50000 UNIT) CAPS capsule Take 1 capsule (50,000 Units total) by mouth every 7 (seven) days. 5 capsule 0   Zoledronic Acid (RECLAST IV) Inject into the vein. Once yearly     loratadine (CLARITIN) 10 MG tablet TAKE 1 TABLET BY MOUTH EVERY DAY (Patient not taking: Reported on 12/10/2021) 90 tablet 3   nitroGLYCERIN (NITROSTAT) 0.4 MG SL tablet PLACE 1 TABLET UNDER THE TONGUE EVERY 5 MINUTES AS NEEDED FOR CHEST PAIN. (Patient not taking: Reported on 12/10/2021) 25 tablet 2   polyethylene glycol (MIRALAX / GLYCOLAX) 17 g packet  Take 17 g by mouth daily as needed for mild constipation. (Patient not taking: Reported on 12/10/2021) 14 each 0   senna (SENOKOT) 8.6 MG TABS tablet Take 1 tablet (8.6 mg total) by mouth 2 (two) times daily. (Patient not taking: Reported on 12/10/2021) 120 tablet 0   No facility-administered medications prior to visit.     Allergies:   Sulfa antibiotics   Social History   Socioeconomic History   Marital status: Widowed    Spouse name: Not on file   Number of children: 2   Years of education: Associates   Highest education level:  Associate degree: academic program  Occupational History   Occupation: Retired  Tobacco Use   Smoking status: Never   Smokeless tobacco: Never  Vaping Use   Vaping Use: Never used  Substance and Sexual Activity   Alcohol use: No   Drug use: No   Sexual activity: Not Currently  Other Topics Concern   Not on file  Social History Narrative   Mother of 2 with 4 grandchildren   Right-handed   Caffeine: 8-10 cups coffee   Social Determinants of Health   Financial Resource Strain: Low Risk  (02/26/2021)   Overall Financial Resource Strain (CARDIA)    Difficulty of Paying Living Expenses: Not hard at all  Food Insecurity: No Food Insecurity (02/26/2021)   Hunger Vital Sign    Worried About Running Out of Food in the Last Year: Never true    Ran Out of Food in the Last Year: Never true  Transportation Needs: No Transportation Needs (02/26/2021)   PRAPARE - Administrator, Civil Service (Medical): No    Lack of Transportation (Non-Medical): No  Physical Activity: Sufficiently Active (02/26/2021)   Exercise Vital Sign    Days of Exercise per Week: 5 days    Minutes of Exercise per Session: 30 min  Stress: No Stress Concern Present (02/26/2021)   [employer] of Occupational Health - Occupational Stress Questionnaire    Feeling of Stress : Only a little  Social Connections: Moderately Integrated (02/26/2021)   Social Connection and  Isolation Panel [NHANES]    Frequency of Communication with Friends and Family: More than three times a week    Frequency of Social Gatherings with Friends and Family: Once a week    Attends Religious Services: 1 to 4 times per year    Active Member of [employer] or Organizations: Yes    Attends Banker Meetings: More than 4 times per year    Marital Status: Widowed     Family History:  The patient's family history includes Asthma in her maternal grandmother; Cancer (age of onset: 16) in her sister; Heart attack in her mother; Heart disease in her father and sister; Heart failure in her father; Hypertension in her father; Stroke in her brother, daughter, and son; Voice disorder in her daughter.   ROS:   Please see the history of present illness.    ROS All other systems are reviewed and are negative.   PHYSICAL EXAM:   VS:  BP 110/64 (BP Location: Left Arm, Patient Position: Sitting, Cuff Size: Normal)   Pulse (!) 49   Ht 5\' 2"  (1.575 m)   Wt 110 lb 12.8 oz (50.3 kg)   SpO2 93%   BMI 20.27 kg/m       General: Alert, oriented x3, no distress, lean and appears younger than stated age Head: no evidence of trauma, PERRL, EOMI, no exophtalmos or lid lag, no myxedema, no xanthelasma; normal ears, nose and oropharynx Neck: normal jugular venous pulsations and no hepatojugular reflux; brisk carotid pulses without delay and no carotid bruits Chest: clear to auscultation, no signs of consolidation by percussion or palpation, normal fremitus, symmetrical and full respiratory excursions Cardiovascular: normal position and quality of the apical impulse, regular rhythm, normal first and second heart sounds, no murmurs, rubs or gallops Abdomen: no tenderness or distention, no masses by palpation, no abnormal pulsatility or arterial bruits, normal bowel sounds, no hepatosplenomegaly Extremities: no clubbing, cyanosis or edema; 2+ radial, ulnar and brachial pulses bilaterally; 2+ right  femoral,  posterior tibial and dorsalis pedis pulses; 2+ left femoral, posterior tibial and dorsalis pedis pulses; no subclavian or femoral bruits Neurological: grossly nonfocal Psych: Normal mood and affect     Wt Readings from Last 3 Encounters:  12/10/21 110 lb 12.8 oz (50.3 kg)  12/10/21 110 lb (49.9 kg)  11/24/21 113 lb 5.1 oz (51.4 kg)      Studies/Labs Reviewed:   EKG:  EKG is not ordered today.  The tracings from 11/30/2021 were personally reviewed.  The initial tracing shows atrial fibrillation rapid ventricular response, subsequent tracing shows sinus rhythm.  It is very similar with previous tracings.  It shows mild sinus bradycardia, possible left atrial abnormality, mild ST depression and T wave inversion in the mid precordial leads.  11/20/2021: TSH 17.071 11/30/2021: ALT 12; B Natriuretic Peptide 86.9; BUN 7; Creatinine, Ser 0.66; Hemoglobin 12.4; Magnesium 1.7; Platelets 268; Potassium 3.5; Sodium 140   Lipid Panel    Component Value Date/Time   CHOL 126 03/27/2021 1454   TRIG 65 03/27/2021 1454   HDL 64 03/27/2021 1454   CHOLHDL 2.0 03/27/2021 1454   CHOLHDL 2.0 07/11/2014 0913   VLDL 12 07/11/2014 0913   LDLCALC 48 03/27/2021 1454     ASSESSMENT:    1. Paroxysmal atrial fibrillation (HCC)   2. History of transient ischemic attack (TIA)   3. Acquired thrombophilia (HCC)   4. Coronary artery disease involving autologous artery coronary bypass graft with other forms of angina pectoris (HCC)   5. Essential hypertension   6. Hypercholesterolemia   7. Nonrheumatic aortic (valve) insufficiency   8. Aneurysm of ascending aorta without rupture (HCC)   9. Acquired hypothyroidism      PLAN:  In order of problems listed above:  AFib/AFlutter: Continues to have a very low burden of atrial fibrillation but is remarkably tachycardic during the arrhythmia, despite very high doses of beta-blockers.  Unable to increase beta-blocker further since she has sinus  bradycardia.  The prevalence of arrhythmia remains very low (well under 1%) and usually episodes are very brief.  Okay to continue taking as needed immediate release diltiazem for spells of symptomatic A-fib with RVR.  She has a history of TIA and high embolic risk (CHA2DS2-VASc 6).  Compliant with anticoagulation.  Did not have any problems with brief interruption of Eliquis in order to undergo kyphoplasty.  Note tendency to have low normal potassium levels or even mild hypokalemia.  We will add a potassium supplement. TIA: She had an episode suggestive of dysarthria/aphasia lasting for about 20 minutes in 2018, this has not recurred.  No evidence of significant carotid disease on CT angiography of the neck.   Anticoagulation: Dose adjusted for age and small body size.  Denies falls and bleeding complications. CAD s/p CABG: Only becomes symptomatic during episodes of rapid atrial fibrillation.  In the past that had some episodes of chest discomfort at rest that could represent vasospasm.   May also have ischemia in the proximal septum downstream of the occluded LAD artery but upstream of the LIMA supplied territory.  Also has an occluded SVG to the oblique marginal artery, although the native circumflex artery is patent.  Not on aspirin due to anticoagulation.   HTN: Well-controlled HLP: Tolerating rosuvastatin well and has LDL within target range.   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  Asymptomatic and no audible murmur on exam.  Unlikely to be an aortic valve replacement candidate since she has had previous  sternotomy. Asc Ao Aneurysm: We have stopped performing surveillance imaging since the aneurysm was small and very stable in size over a long period of time and it is highly unlikely she will ever be a preventive surgery candidate at her age, since she has had previous CABG. Acquired hypothyroidism/iatrogenic hyperthyroidism: Multinodular goiter.  TSH  was elevated during recent hospitalization, but this probably represented sick euthyroid syndrome.  Sees Dr. Dorris Fetch in [place].  Time spent with patient: 40 minutes.  Medication Adjustments/Labs and Tests Ordered: Current medicines are reviewed at length with the patient today.  Concerns regarding medicines are outlined above.  Medication changes, Labs and Tests ordered today are listed in the Patient Instructions below. Patient Instructions  Medication Instructions:  START Potassium 10 mEq once daily  *If you need a refill on your cardiac medications before your next appointment, please call your pharmacy*   Lab Work: None ordered If you have labs (blood work) drawn today and your tests are completely normal, you will receive your results only by: [place] (if you have MyChart) OR A paper copy in the mail If you have any lab test that is abnormal or we need to change your treatment, we will call you to review the results.   Testing/Procedures: None ordered   Follow-Up: At [facility], you and your health needs are our priority.  As part of our continuing mission to provide you with exceptional heart care, we have created designated Provider Care Teams.  These Care Teams include your primary Cardiologist (physician) and Advanced Practice Providers (APPs -  Physician Assistants and Nurse Practitioners) who all work together to provide you with the care you need, when you need it.  We recommend signing up for the patient portal called "MyChart".  Sign up information is provided on this After Visit Summary.  MyChart is used to connect with patients for Virtual Visits (Telemedicine).  Patients are able to view lab/test results, encounter notes, upcoming appointments, etc.  Non-urgent messages can be sent to your provider as well.   To learn more about what you can do with MyChart, go to [email or web address].    Your next appointment:   12 month(s)  The format for your next  appointment:   In Person  Provider:   Sanda Klein, MD {   Important Information About Sugar          Signed, Sanda Klein, MD  12/12/2021 1:59 PM    [address], [place], [place]  [address] Phone: [phone]; Fax: [phone]

## 2021-12-10 NOTE — Patient Instructions (Signed)
Medication Instructions:  START Potassium 10 mEq once daily  *If you need a refill on your cardiac medications before your next appointment, please call your pharmacy*   Lab Work: None ordered If you have labs (blood work) drawn today and your tests are completely normal, you will receive your results only by: MyChart Message (if you have MyChart) OR A paper copy in the mail If you have any lab test that is abnormal or we need to change your treatment, we will call you to review the results.   Testing/Procedures: None ordered   Follow-Up: At [facility], you and your health needs are our priority.  As part of our continuing mission to provide you with exceptional heart care, we have created designated Provider Care Teams.  These Care Teams include your primary Cardiologist (physician) and Advanced Practice Providers (APPs -  Physician Assistants and Nurse Practitioners) who all work together to provide you with the care you need, when you need it.  We recommend signing up for the patient portal called "MyChart".  Sign up information is provided on this After Visit Summary.  MyChart is used to connect with patients for Virtual Visits (Telemedicine).  Patients are able to view lab/test results, encounter notes, upcoming appointments, etc.  Non-urgent messages can be sent to your provider as well.   To learn more about what you can do with MyChart, go to [email or web address].    Your next appointment:   12 month(s)  The format for your next appointment:   In Person  Provider:   Thurmon Fair, MD {   Important Information About Sugar

## 2021-12-10 NOTE — Patient Instructions (Signed)
Atrial Fibrillation  Atrial fibrillation is a type of heartbeat that is irregular or fast. If you have this condition, your heart beats without any order. This makes it hard for your heart to pump blood in a normal way. Atrial fibrillation may come and go, or it may become a long-lasting problem. If this condition is not treated, it can put you at higher risk for stroke, heart failure, and other heart problems. What are the causes? This condition may be caused by diseases that damage the heart. They include: High blood pressure. Heart failure. Heart valve disease. Heart surgery. Other causes include: Diabetes. Thyroid disease. Being overweight. Kidney disease. Sometimes the cause is not known. What increases the risk? You are more likely to develop this condition if: You are older. You smoke. You exercise often and very hard. You have a family history of this condition. You are a man. You use drugs. You drink a lot of alcohol. You have lung conditions, such as emphysema, pneumonia, or COPD. You have sleep apnea. What are the signs or symptoms? Common symptoms of this condition include: A feeling that your heart is beating very fast. Chest pain or discomfort. Feeling short of breath. Suddenly feeling light-headed or weak. Getting tired easily during activity. Fainting. Sweating. In some cases, there are no symptoms. How is this treated? Treatment for this condition depends on underlying conditions and how you feel when you have atrial fibrillation. They include: Medicines to: Prevent blood clots. Treat heart rate or heart rhythm problems. Using devices, such as a pacemaker, to correct heart rhythm problems. Doing surgery to remove the part of the heart that sends bad signals. Closing an area where clots can form in the heart (left atrial appendage). In some cases, your doctor will treat other underlying conditions. Follow these instructions at home: Medicines Take  over-the-counter and prescription medicines only as told by your doctor. Do not take any new medicines without first talking to your doctor. If you are taking blood thinners: Talk with your doctor before you take any medicines that have aspirin or NSAIDs, such as ibuprofen, in them. Take your medicine exactly as told by your doctor. Take it at the same time each day. Avoid activities that could hurt or bruise you. Follow instructions about how to prevent falls. Wear a bracelet that says you are taking blood thinners. Or, carry a card that lists what medicines you take. Lifestyle     Do not use any products that have nicotine or tobacco in them. These include cigarettes, e-cigarettes, and chewing tobacco. If you need help quitting, ask your doctor. Eat heart-healthy foods. Talk with your doctor about the right eating plan for you. Exercise regularly as told by your doctor. Do not drink alcohol. Lose weight if you are overweight. Do not use drugs, including cannabis. General instructions If you have a condition that causes breathing to stop for a short period of time (apnea), treat it as told by your doctor. Keep a healthy weight. Do not use diet pills unless your doctor says they are safe for you. Diet pills may make heart problems worse. Keep all follow-up visits as told by your doctor. This is important. Contact a doctor if: You notice a change in the speed, rhythm, or strength of your heartbeat. You are taking a blood-thinning medicine and you get more bruising. You get tired more easily when you move or exercise. You have a sudden change in weight. Get help right away if:  You have pain in   your chest or your belly (abdomen). You have trouble breathing. You have side effects of blood thinners, such as blood in your vomit, poop (stool), or pee (urine), or bleeding that cannot stop. You have any signs of a stroke. "BE FAST" is an easy way to remember the main warning signs: B -  Balance. Signs are dizziness, sudden trouble walking, or loss of balance. E - Eyes. Signs are trouble seeing or a change in how you see. F - Face. Signs are sudden weakness or loss of feeling in the face, or the face or eyelid drooping on one side. A - Arms. Signs are weakness or loss of feeling in an arm. This happens suddenly and usually on one side of the body. S - Speech. Signs are sudden trouble speaking, slurred speech, or trouble understanding what people say. T - Time. Time to call emergency services. Write down what time symptoms started. You have other signs of a stroke, such as: A sudden, very bad headache with no known cause. Feeling like you may vomit (nausea). Vomiting. A seizure. These symptoms may be an emergency. Do not wait to see if the symptoms will go away. Get medical help right away. Call your local emergency services (911 in the [place] Do not drive yourself to the hospital. Summary Atrial fibrillation is a type of heartbeat that is irregular or fast. You are at higher risk of this condition if you smoke, are older, have diabetes, or are overweight. Follow your doctor's instructions about medicines, diet, exercise, and follow-up visits. Get help right away if you have signs or symptoms of a stroke. Get help right away if you cannot catch your breath, or you have chest pain or discomfort. This information is not intended to replace advice given to you by your health care provider. Make sure you discuss any questions you have with your health care provider. Document Revised: 10/25/2018 Document Reviewed: 10/25/2018 Elsevier Patient Education  2023 Elsevier Inc.  

## 2021-12-12 ENCOUNTER — Encounter: Payer: Self-pay | Admitting: Cardiovascular Disease

## 2022-01-01 ENCOUNTER — Ambulatory Visit (INDEPENDENT_AMBULATORY_CARE_PROVIDER_SITE_OTHER): Payer: Medicare HMO | Admitting: Nurse Practitioner

## 2022-01-01 ENCOUNTER — Encounter: Payer: Self-pay | Admitting: Nurse Practitioner

## 2022-01-01 VITALS — BP 132/60 | HR 54 | Temp 98.1°F | Resp 20 | Ht 62.0 in | Wt 108.0 lb

## 2022-01-01 DIAGNOSIS — E782 Mixed hyperlipidemia: Secondary | ICD-10-CM

## 2022-01-01 DIAGNOSIS — I25718 Atherosclerosis of autologous vein coronary artery bypass graft(s) with other forms of angina pectoris: Secondary | ICD-10-CM

## 2022-01-01 DIAGNOSIS — I10 Essential (primary) hypertension: Secondary | ICD-10-CM | POA: Diagnosis not present

## 2022-01-01 DIAGNOSIS — E039 Hypothyroidism, unspecified: Secondary | ICD-10-CM

## 2022-01-01 DIAGNOSIS — I4891 Unspecified atrial fibrillation: Secondary | ICD-10-CM

## 2022-01-01 DIAGNOSIS — D6869 Other thrombophilia: Secondary | ICD-10-CM

## 2022-01-01 DIAGNOSIS — M5030 Other cervical disc degeneration, unspecified cervical region: Secondary | ICD-10-CM

## 2022-01-01 DIAGNOSIS — S32010D Wedge compression fracture of first lumbar vertebra, subsequent encounter for fracture with routine healing: Secondary | ICD-10-CM

## 2022-01-01 DIAGNOSIS — F419 Anxiety disorder, unspecified: Secondary | ICD-10-CM

## 2022-01-01 DIAGNOSIS — I7121 Aneurysm of the ascending aorta, without rupture: Secondary | ICD-10-CM | POA: Diagnosis not present

## 2022-01-01 DIAGNOSIS — E876 Hypokalemia: Secondary | ICD-10-CM

## 2022-01-01 NOTE — Progress Notes (Signed)
Subjective:    Patient ID: Evelyn [name], female    DOB: 11-Dec-1940, 81 y.o.   MRN: [id number]   Chief Complaint: medical management of chronic issues     HPI:  Evelyn [name] is a 81 y.o. who identifies as a female who was assigned female at birth.   Social history: Lives with: by herself Work history: retired   Scientist, forensic in today for follow up of the following chronic medical issues:  1. Essential hypertension No c/o chest pain, sob or headache. Does not check blood pressure at home. BP Readings from Last 3 Encounters:  12/10/21 110/64  12/10/21 104/63  11/30/21 (!) 128/58     2. 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 Atrial fibrillation with RVR ([place]) 4. Aneurysm of ascending aorta without rupture ([place]) Evelyn [name] saw cardiology on 12/10/21. [place] office note showed no change to plan of care.  5. Acquired hypothyroidism No problems that she  is aware of. Usually sees endocrinologist Lab Results  Component Value Date   TSH 17.071 (H) 11/20/2021     6. Acquired thrombophilia ([place]) Lab Results  Component Value Date   WBC 9.5 11/30/2021   HGB 12.4 11/30/2021   HCT 38.0 11/30/2021   MCV 94.5 11/30/2021   PLT 268 11/30/2021     7. Hypokalemia No c/o muscle cramps Lab Results  Component Value Date   K 3.5 11/30/2021     8. Mixed hyperlipidemia Doe snot really watch diet. Has not been very active lately due to back pain. Lab Results  Component Value Date   CHOL 126 03/27/2021   HDL 64 03/27/2021   LDLCALC 48 03/27/2021   TRIG 65 03/27/2021   CHOLHDL 2.0 03/27/2021     9. 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 Is on no anxiety meds    01/01/2022   12:09 PM 12/10/2021    8:50 AM 09/29/2021   12:06 PM 06/25/2021    2:55 PM  GAD 7 : Generalized Anxiety Score  Nervous, Anxious, on Edge 0 0 0 0  Control/stop worrying 0 0 0 0  Worry too much - different things 0 0 0 0  Trouble relaxing 0 0 0 0   Restless 0 0 0 0  Easily annoyed or irritable 1 0 0 0  Afraid - awful might happen 0 0 0 0  Total GAD 7 Score 1 0 0 0  Anxiety Difficulty Not difficult at all Not difficult at all Not difficult at all Not difficult at all      10. DDD (degenerative disc disease), cervical Has had lots of back pain lately  11. Compression fracture of L1 vertebra with routine healing, subsequent encounter She had to have cement injected in her back. Is 100% better   New complaints: None today  Allergies  Allergen Reactions   Sulfa Antibiotics Swelling   Outpatient Encounter Medications as of 01/01/2022  Medication Sig   apixaban (ELIQUIS) 2.5 MG TABS tablet Take 1 tablet (2.5 mg total) by mouth 2 (two) times daily.   diltiazem (CARDIZEM) 30 MG tablet Take 1 tablet (30 mg total) by mouth every 6 (six) hours as needed (for rapid heart rate).   docusate sodium (COLACE) 100 MG capsule Take 1 capsule (100 mg total) by mouth 2 (two) times daily.   furosemide (LASIX) 20 MG tablet Take 1 tablet (20 mg total) by mouth daily.   hydrocortisone 2.5 % cream APPLY TO AFFECTED AREA TWICE A DAY (Patient taking  differently: Apply 1 Application topically daily as needed (itching).)   isosorbide mononitrate (IMDUR) 60 MG 24 hr tablet TAKE 1 TABLET (60 MG TOTAL) DAILY BY MOUTH. (Patient taking differently: Take 60 mg by mouth daily.)   levothyroxine (SYNTHROID) 75 MCG tablet Take 1 tablet (75 mcg total) by mouth daily before breakfast.   loratadine (CLARITIN) 10 MG tablet TAKE 1 TABLET BY MOUTH EVERY DAY (Patient not taking: Reported on 12/10/2021)   methocarbamol (ROBAXIN) 500 MG tablet TAKE 1 TABLET (500 MG TOTAL) BY MOUTH EVERY 12 (TWELVE) HOURS AS NEEDED FOR MUSCLE SPASMS.   metoprolol tartrate (LOPRESSOR) 100 MG tablet Take 1 tablet (100 mg total) by mouth 2 (two) times daily.   nitroGLYCERIN (NITROSTAT) 0.4 MG SL tablet PLACE 1 TABLET UNDER THE TONGUE EVERY 5 MINUTES AS NEEDED FOR CHEST PAIN. (Patient not taking:  Reported on 12/10/2021)   polyethylene glycol (MIRALAX / GLYCOLAX) 17 g packet Take 17 g by mouth daily as needed for mild constipation. (Patient not taking: Reported on 12/10/2021)   potassium chloride (KLOR-CON) 10 MEQ tablet Take 1 tablet (10 mEq total) by mouth daily.   rosuvastatin (CRESTOR) 10 MG tablet Take 1 tablet (10 mg total) by mouth daily. (Patient taking differently: Take 30 mg by mouth daily.)   senna (SENOKOT) 8.6 MG TABS tablet Take 1 tablet (8.6 mg total) by mouth 2 (two) times daily. (Patient not taking: Reported on 12/10/2021)   Vitamin D, Ergocalciferol, (DRISDOL) 1.25 MG (50000 UNIT) CAPS capsule Take 1 capsule (50,000 Units total) by mouth every 7 (seven) days.   Zoledronic Acid (RECLAST IV) Inject into the vein. Once yearly   No facility-administered encounter medications on file as of 01/01/2022.    Past Surgical History:  Procedure Laterality Date   ABDOMINAL HYSTERECTOMY     BACK SURGERY  2009   CORONARY ANGIOPLASTY  2003   cutting balloon atherectomy in-stent stenosis of the LAD.   CORONARY ANGIOPLASTY WITH STENT PLACEMENT  2002   remote PCI & stenting LAD,   CORONARY ARTERY BYPASS GRAFT  2004   LIMA to LAD,vein graft to diagonal,vein graft to OM   FOOT SURGERY     KYPHOPLASTY N/A 11/24/2021   Procedure: LUMBAR ONE KYPHOPLASTY;  Surgeon: Kristeen Miss, MD;  Location: [place];  Service: Neurosurgery;  Laterality: N/A;  LUMBAR ONE KYPHOPLASTY   LEFT HEART CATH AND CORS/GRAFTS ANGIOGRAPHY N/A 02/23/2017   Procedure: LEFT HEART CATH AND CORS/GRAFTS ANGIOGRAPHY;  Surgeon: Wellington Hampshire, MD;  Location: [place] [facility];  Service: Cardiovascular;  Laterality: N/A;   LEFT HEART CATHETERIZATION WITH CORONARY ANGIOGRAM N/A 12/15/2011   Procedure: LEFT HEART CATHETERIZATION WITH CORONARY ANGIOGRAM;  Surgeon: Sanda Klein, MD;  Location: [facility];  Service: Cardiovascular;  Laterality: N/A;   LOOP RECORDER INSERTION N/A 06/14/2018   Procedure: LOOP RECORDER INSERTION;   Surgeon: Sanda Klein, MD;  Location: [place];  Service: Cardiovascular;  Laterality: N/A;   NECK SURGERY     NM MYOCAR PERF WALL MOTION  12/01/2011   normal study-persistent exercise induced ECG changes raises the concern for ischemia (balanced ischemia).   NOSE SURGERY     PACEMAKER INSERTION     TONSILLECTOMY  1948   TUBAL LIGATION      Family History  Problem Relation Age of Onset   Heart attack Mother    Heart failure Father    Hypertension Father    Heart disease Father    Asthma Maternal Grandmother    Heart disease Sister  Stroke Brother    Voice disorder Daughter        vocal dystonia   Stroke Daughter    Stroke Son    Cancer Sister 36      Controlled substance contract: n/a     Review of Systems  Constitutional:  Negative for diaphoresis.  Eyes:  Negative for pain.  Respiratory:  Negative for shortness of breath.   Cardiovascular:  Negative for chest pain, palpitations and leg swelling.  Gastrointestinal:  Negative for abdominal pain.  Endocrine: Negative for polydipsia.  Skin:  Negative for rash.  Neurological:  Negative for dizziness, weakness and headaches.  Hematological:  Does not bruise/bleed easily.  All other systems reviewed and are negative.      Objective:   Physical Exam Vitals and nursing note reviewed.  Constitutional:      General: She is not in acute distress.    Appearance: Normal appearance. She is well-developed.  HENT:     Head: Normocephalic.     Right Ear: Tympanic membrane normal.     Left Ear: Tympanic membrane normal.     Nose: Nose normal.     Mouth/Throat:     Mouth: Mucous membranes are moist.  Eyes:     Pupils: Pupils are equal, round, and reactive to light.  Neck:     Vascular: No carotid bruit or JVD.  Cardiovascular:     Rate and Rhythm: Normal rate and regular rhythm.     Heart sounds: Normal heart sounds.  Pulmonary:     Effort: Pulmonary effort is normal. No respiratory distress.      Breath sounds: Normal breath sounds. No wheezing or rales.  Chest:     Chest wall: No tenderness.  Abdominal:     General: Bowel sounds are normal. There is no distension or abdominal bruit.     Palpations: Abdomen is soft. There is no hepatomegaly, splenomegaly, mass or pulsatile mass.     Tenderness: There is no abdominal tenderness.  Musculoskeletal:        General: Normal range of motion.     Cervical back: Normal range of motion and neck supple.  Lymphadenopathy:     Cervical: No cervical adenopathy.  Skin:    General: Skin is warm and dry.  Neurological:     Mental Status: She is alert and oriented to person, place, and time.     Deep Tendon Reflexes: Reflexes are normal and symmetric.  Psychiatric:        Behavior: Behavior normal.        Thought Content: Thought content normal.        Judgment: Judgment normal.     BP 132/60   Pulse (!) 54   Temp 98.1 F (36.7 C) (Temporal)   Resp 20   Ht 5' 2"  (1.575 m)   Wt 108 lb (49 kg)   SpO2 94%   BMI 19.75 kg/m        Assessment & Plan:  Evelyn [name] comes in today with chief complaint of Medical Management of Chronic Issues   Diagnosis and orders addressed:  1. Essential hypertension Low sodium diet - CBC with Differential/Platelet - CMP14+EGFR  2. 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 Atrial fibrillation with RVR ([place]) 4. Aneurysm of ascending aorta without rupture ([facility]) Keep follow up with cardiollogy  5. Acquired hypothyroidism Labs pending - Thyroid Panel With TSH  6. Acquired thrombophilia ([place]) Labs oending  7. Hypokalemia Abs pending  8. Mixed hyperlipidemia Low fta diet - Lipid panel  9. Anxiety Stress management  10. DDD (degenerative disc disease), cervical Moist heat Rest Keep follow up with specialist  11. Compression fracture of L1 vertebra with routine healing, subsequent encounter   Labs pending Health Maintenance  reviewed Diet and exercise encouraged  Follow up plan: 3 months   [place], FNP

## 2022-01-01 NOTE — Patient Instructions (Signed)

## 2022-01-02 LAB — CBC WITH DIFFERENTIAL/PLATELET
Basophils Absolute: 0.1 10*3/uL (ref 0.0–0.2)
Basos: 1 %
EOS (ABSOLUTE): 0.1 10*3/uL (ref 0.0–0.4)
Eos: 1 %
Hematocrit: 40.8 % (ref 34.0–46.6)
Hemoglobin: 13.2 g/dL (ref 11.1–15.9)
Immature Grans (Abs): 0 10*3/uL (ref 0.0–0.1)
Immature Granulocytes: 0 %
Lymphocytes Absolute: 3.4 10*3/uL — ABNORMAL HIGH (ref 0.7–3.1)
Lymphs: 41 %
MCH: 31.1 pg (ref 26.6–33.0)
MCHC: 32.4 g/dL (ref 31.5–35.7)
MCV: 96 fL (ref 79–97)
Monocytes Absolute: 0.8 10*3/uL (ref 0.1–0.9)
Monocytes: 10 %
Neutrophils Absolute: 3.8 10*3/uL (ref 1.4–7.0)
Neutrophils: 47 %
Platelets: 220 10*3/uL (ref 150–450)
RBC: 4.25 x10E6/uL (ref 3.77–5.28)
RDW: 15.6 % — ABNORMAL HIGH (ref 11.7–15.4)
WBC: 8.1 10*3/uL (ref 3.4–10.8)

## 2022-01-02 LAB — CMP14+EGFR
ALT: 9 IU/L (ref 0–32)
AST: 16 IU/L (ref 0–40)
Albumin/Globulin Ratio: 1.8 (ref 1.2–2.2)
Albumin: 4.5 g/dL (ref 3.7–4.7)
Alkaline Phosphatase: 60 IU/L (ref 44–121)
BUN/Creatinine Ratio: 14 (ref 12–28)
BUN: 10 mg/dL (ref 8–27)
Bilirubin Total: 0.4 mg/dL (ref 0.0–1.2)
CO2: 18 mmol/L — ABNORMAL LOW (ref 20–29)
Calcium: 9.7 mg/dL (ref 8.7–10.3)
Chloride: 104 mmol/L (ref 96–106)
Creatinine, Ser: 0.7 mg/dL (ref 0.57–1.00)
Globulin, Total: 2.5 g/dL (ref 1.5–4.5)
Glucose: 92 mg/dL (ref 70–99)
Potassium: 4.4 mmol/L (ref 3.5–5.2)
Sodium: 141 mmol/L (ref 134–144)
Total Protein: 7 g/dL (ref 6.0–8.5)
eGFR: 86 mL/min/{1.73_m2} (ref >59)

## 2022-01-02 LAB — LIPID PANEL
Chol/HDL Ratio: 2.6 ratio (ref 0.0–4.4)
Cholesterol, Total: 154 mg/dL (ref 100–199)
HDL: 60 mg/dL (ref >39)
LDL Chol Calc (NIH): 78 mg/dL (ref 0–99)
Triglycerides: 87 mg/dL (ref 0–149)
VLDL Cholesterol Cal: 16 mg/dL (ref 5–40)

## 2022-01-02 LAB — THYROID PANEL WITH TSH
Free Thyroxine Index: 2.8 (ref 1.2–4.9)
T3 Uptake Ratio: 28 % (ref 24–39)
T4, Total: 9.9 ug/dL (ref 4.5–12.0)
TSH: 2.82 u[IU]/mL (ref 0.450–4.500)

## 2022-02-01 ENCOUNTER — Telehealth: Payer: Self-pay | Admitting: "Endocrinology

## 2022-02-01 DIAGNOSIS — E039 Hypothyroidism, unspecified: Secondary | ICD-10-CM

## 2022-02-01 NOTE — Telephone Encounter (Signed)
Lab orders updated and sent to [facility]. 

## 2022-02-01 NOTE — Telephone Encounter (Signed)
New message    The patient is calling - last office visit 6/22.   Asking for lab order to be placed - she can make an upcoming appt.

## 2022-02-02 ENCOUNTER — Encounter: Payer: Self-pay | Admitting: Cardiology

## 2022-02-02 ENCOUNTER — Ambulatory Visit: Payer: Medicare HMO | Attending: Cardiology | Admitting: Cardiology

## 2022-02-02 VITALS — BP 132/74 | HR 60 | Ht 62.0 in | Wt 107.6 lb

## 2022-02-02 DIAGNOSIS — D6869 Other thrombophilia: Secondary | ICD-10-CM | POA: Diagnosis not present

## 2022-02-02 DIAGNOSIS — I480 Paroxysmal atrial fibrillation: Secondary | ICD-10-CM | POA: Diagnosis not present

## 2022-02-02 NOTE — Progress Notes (Signed)
Electrophysiology Office Note   Date:  02/02/2022   ID:  Evelyn [name], DOB Oct 02, 1940, MRN [id number]  PCP:  Chevis Pretty, FNP  Cardiologist: [facility] Primary Electrophysiologist:  Terrionna Bridwell Meredith Leeds, MD    Chief Complaint: atrial fibrillation   History of Present Illness: Evelyn [name] is a 81 y.o. female who is being seen today for the evaluation of atrial fibrillation at the request of Clingerman, Mary-Margaret, *. Presenting today for electrophysiology evaluation.  She has a history stated for atrial fibrillation/flutter, coronary artery disease status post CABG, TIA, hypertension, hyperlipidemia, aortic insufficiency, ascending aortic aneurysm.  She has a Linq monitor for atrial fibrillation monitoring.  Today, denies symptoms of palpitations, chest pain, shortness of breath, orthopnea, PND, lower extremity edema, claudication, dizziness, presyncope, syncope, bleeding, or neurologic sequela. The patient is tolerating medications without difficulties.  He has done well over the last few months.  She did go into atrial fibrillation in July.  This required a visit to the emergency room.  Prior to any intervention in the emergency room she converted to sinus rhythm.  She felt quite poorly with fatigue and shortness of breath as well as palpitations.  She has not had any further episodes.  She did have a spinal fracture over the summer requiring an operation.  She continues to have back pain.   Past Medical History:  Diagnosis Date   Aneurysm ([place])    Aortic insufficiency    Atrial fibrillation ([facility])    CAD (coronary artery disease)    CABG LIMA to LAD,vein graft to diagonal,vein graft to OM   Dyslipidemia    GERD (gastroesophageal reflux disease)    Hypothyroid    Past Surgical History:  Procedure Laterality Date   ABDOMINAL HYSTERECTOMY     BACK SURGERY  2009   CORONARY ANGIOPLASTY  2003   cutting balloon atherectomy in-stent stenosis of the LAD.   CORONARY  ANGIOPLASTY WITH STENT PLACEMENT  2002   remote PCI & stenting LAD,   CORONARY ARTERY BYPASS GRAFT  2004   LIMA to LAD,vein graft to diagonal,vein graft to OM   FOOT SURGERY     KYPHOPLASTY N/A 11/24/2021   Procedure: LUMBAR ONE KYPHOPLASTY;  Surgeon: Kristeen Miss, MD;  Location: [facility];  Service: Neurosurgery;  Laterality: N/A;  LUMBAR ONE KYPHOPLASTY   LEFT HEART CATH AND CORS/GRAFTS ANGIOGRAPHY N/A 02/23/2017   Procedure: LEFT HEART CATH AND CORS/GRAFTS ANGIOGRAPHY;  Surgeon: Wellington Hampshire, MD;  Location: [facility];  Service: Cardiovascular;  Laterality: N/A;   LEFT HEART CATHETERIZATION WITH CORONARY ANGIOGRAM N/A 12/15/2011   Procedure: LEFT HEART CATHETERIZATION WITH CORONARY ANGIOGRAM;  Surgeon: Sanda Klein, MD;  Location: [facility];  Service: Cardiovascular;  Laterality: N/A;   LOOP RECORDER INSERTION N/A 06/14/2018   Procedure: LOOP RECORDER INSERTION;  Surgeon: Sanda Klein, MD;  Location: [facility];  Service: Cardiovascular;  Laterality: N/A;   NECK SURGERY     NM MYOCAR PERF WALL MOTION  12/01/2011   normal study-persistent exercise induced ECG changes raises the concern for ischemia (balanced ischemia).   NOSE SURGERY     PACEMAKER INSERTION     TONSILLECTOMY  1948   TUBAL LIGATION       Current Outpatient Medications  Medication Sig Dispense Refill   apixaban (ELIQUIS) 2.5 MG TABS tablet Take 1 tablet (2.5 mg total) by mouth 2 (two) times daily. 180 tablet 1   diltiazem (CARDIZEM) 30 MG tablet Take 1 tablet (30 mg total) by  mouth every 6 (six) hours as needed (for rapid heart rate). 30 tablet 3   HYDROCODONE-ACETAMINOPHEN PO Take by mouth. prn     hydrocortisone 2.5 % cream APPLY TO AFFECTED AREA TWICE A DAY (Patient taking differently: Apply 1 Application topically daily as needed (itching).) 28.35 g 6   isosorbide mononitrate (IMDUR) 60 MG 24 hr tablet TAKE 1 TABLET (60 MG TOTAL) DAILY BY MOUTH. (Patient taking differently: Take 60 mg by mouth  daily.) 90 tablet 1   levothyroxine (SYNTHROID) 75 MCG tablet Take 1 tablet (75 mcg total) by mouth daily before breakfast. 90 tablet 1   loratadine (CLARITIN) 10 MG tablet TAKE 1 TABLET BY MOUTH EVERY DAY 90 tablet 3   methocarbamol (ROBAXIN) 500 MG tablet TAKE 1 TABLET (500 MG TOTAL) BY MOUTH EVERY 12 (TWELVE) HOURS AS NEEDED FOR MUSCLE SPASMS. 20 tablet 0   metoprolol tartrate (LOPRESSOR) 100 MG tablet Take 1 tablet (100 mg total) by mouth 2 (two) times daily. 180 tablet 1   nitroGLYCERIN (NITROSTAT) 0.4 MG SL tablet PLACE 1 TABLET UNDER THE TONGUE EVERY 5 MINUTES AS NEEDED FOR CHEST PAIN. 25 tablet 2   polyethylene glycol (MIRALAX / GLYCOLAX) 17 g packet Take 17 g by mouth daily as needed for mild constipation. 14 each 0   potassium chloride (KLOR-CON) 10 MEQ tablet Take 1 tablet (10 mEq total) by mouth daily. 90 tablet 3   rosuvastatin (CRESTOR) 10 MG tablet Take 1 tablet (10 mg total) by mouth daily. (Patient taking differently: Take 30 mg by mouth daily.) 90 tablet 1   rosuvastatin (CRESTOR) 20 MG tablet Take 20 mg by mouth daily.     senna (SENOKOT) 8.6 MG TABS tablet Take 1 tablet (8.6 mg total) by mouth 2 (two) times daily. 120 tablet 0   Zoledronic Acid (RECLAST IV) Inject into the vein. Once yearly     docusate sodium (COLACE) 100 MG capsule Take 1 capsule (100 mg total) by mouth 2 (two) times daily. (Patient not taking: Reported on 02/02/2022) 10 capsule 0   furosemide (LASIX) 20 MG tablet Take 1 tablet (20 mg total) by mouth daily. (Patient not taking: Reported on 02/02/2022) 90 tablet 1   Vitamin D, Ergocalciferol, (DRISDOL) 1.25 MG (50000 UNIT) CAPS capsule Take 1 capsule (50,000 Units total) by mouth every 7 (seven) days. (Patient not taking: Reported on 02/02/2022) 5 capsule 0   No current facility-administered medications for this visit.    Allergies:   Sulfa antibiotics   Social History:  The patient  reports that she has never smoked. She has never used smokeless tobacco. She  reports that she does not drink alcohol and does not use drugs.   Family History:  The patient's family history includes Asthma in her maternal grandmother; Cancer (age of onset: 38) in her sister; Heart attack in her mother; Heart disease in her father and sister; Heart failure in her father; Hypertension in her father; Stroke in her brother, daughter, and son; Voice disorder in her daughter.   ROS:  Please see the history of present illness.   Otherwise, review of systems is positive for none.   All other systems are reviewed and negative.   PHYSICAL EXAM: VS:  BP 132/74   Pulse 60   Ht 5\' 2"  (1.575 m)   Wt 107 lb 9.6 oz (48.8 kg)   SpO2 98%   BMI 19.68 kg/m  , BMI Body mass index is 19.68 kg/m. GEN: Well nourished, well developed, in no acute distress  HEENT: normal  Neck: no JVD, carotid bruits, or masses Cardiac: RRR; no murmurs, rubs, or gallops,no edema  Respiratory:  clear to auscultation bilaterally, normal work of breathing GI: soft, nontender, nondistended, + BS MS: no deformity or atrophy  Skin: warm and dry, device site well healed Neuro:  Strength and sensation are intact Psych: euthymic mood, full affect  EKG:  EKG is not ordered today. Personal review of the ekg ordered 11/30/21 shows sinus rhythm, rate 56, T wave inversions  Personal review of the device interrogation today. Results in [place]: 11/30/2021: B Natriuretic Peptide 86.9; Magnesium 1.7 01/01/2022: ALT 9; BUN 10; Creatinine, Ser 0.70; Hemoglobin 13.2; Platelets 220; Potassium 4.4; Sodium 141; TSH 2.820    Lipid Panel     Component Value Date/Time   CHOL 154 01/01/2022 1231   TRIG 87 01/01/2022 1231   HDL 60 01/01/2022 1231   CHOLHDL 2.6 01/01/2022 1231   CHOLHDL 2.0 07/11/2014 0913   VLDL 12 07/11/2014 0913   LDLCALC 78 01/01/2022 1231     Wt Readings from Last 3 Encounters:  02/02/22 107 lb 9.6 oz (48.8 kg)  01/01/22 108 lb (49 kg)  12/10/21 110 lb 12.8 oz (50.3 kg)       Other studies Reviewed: Additional studies/ records that were reviewed today include: TTE 2018  Review of the above records today demonstrates:  - Left ventricle: The cavity size was normal. Systolic function was   normal. The estimated ejection fraction was in the range of 55%   to 60%. Mild hypokinesis of the inferior and inferoseptal   myocardium. Doppler parameters are consistent with abnormal left   ventricular relaxation (grade 1 diastolic dysfunction). Doppler   parameters are consistent with indeterminate ventricular filling   pressure. - Aortic valve: Transvalvular velocity was within the normal range.   There was no stenosis. There was mild regurgitation. - Aorta: Ascending aortic diameter: 37 mm (S). - Ascending aorta: The ascending aorta was mildly dilated. - Mitral valve: Transvalvular velocity was within the normal range.   There was no evidence for stenosis. There was mild regurgitation. - Right ventricle: The cavity size was normal. Wall thickness was   normal. Systolic function was normal. - Atrial septum: No defect or patent foramen ovale was identified   by color flow Doppler or saline microcavitation study. - Tricuspid valve: There was moderate regurgitation. - Pulmonary arteries: Systolic pressure was within the normal   range. PA peak pressure: 27 mm Hg (S).  [place] 2018 The left ventricular systolic function is normal. LV end diastolic pressure is mildly elevated. The left ventricular ejection fraction is 55-65% by visual estimate. Ost LAD to Mid LAD lesion, 100 %stenosed. Ost Cx lesion, 20 %stenosed. LM lesion, 20 %stenosed. Prox RCA lesion, 20 %stenosed. SVG and is normal in caliber and anatomically normal. LIMA and is normal in caliber and anatomically normal.  ASSESSMENT AND PLAN:  1.  Paroxysmal atrial fibrillation: Currently on Eliquis 2.5 mg twice daily, metoprolol 100 mg twice daily.  CHA2DS2-VASc of 6.  Minimal episodes over the last few months.   She has as needed diltiazem.  We Zev Blue continue with current management.  2.  Coronary artery disease: Status post CABG.  No current chest pain.  3.  Hyperlipidemia: Continue Crestor per primary cardiology  4.  Secondary hypercoagulable state: Currently on Eliquis for atrial fibrillation as above  Current medicines are reviewed at length with the patient today.   The patient does not have concerns regarding  her medicines.  The following changes were made today: None  Labs/ tests ordered today include:  No orders of the defined types were placed in this encounter.    Disposition:   FU with Tresea Heine as needed months  Signed, Hamdi Kley Meredith Leeds, MD  02/02/2022 3:14 PM     [address] [place] [address] [phone] (office) [phone] (fax)

## 2022-02-02 NOTE — Patient Instructions (Signed)
Medication Instructions:  Your physician recommends that you continue on your current medications as directed. Please refer to the Current Medication list given to you today.  *If you need a refill on your cardiac medications before your next appointment, please call your pharmacy*   Lab Work: None ordered   Testing/Procedures: None ordered   Follow-Up: At [facility], you and your health needs are our priority.  As part of our continuing mission to provide you with exceptional heart care, we have created designated Provider Care Teams.  These Care Teams include your primary Cardiologist (physician) and Advanced Practice Providers (APPs -  Physician Assistants and Nurse Practitioners) who all work together to provide you with the care you need, when you need it.  Your next appointment:   AS  NEEDED  The format for your next appointment:   In Person  Provider:   Allegra Lai, MD    Thank you for choosing [facility]!!   Trinidad Curet, RN [phone]  Other Instructions   Important Information About Sugar

## 2022-02-10 ENCOUNTER — Other Ambulatory Visit: Payer: Medicare HMO

## 2022-02-11 LAB — TSH: TSH: 8.88 u[IU]/mL — ABNORMAL HIGH (ref 0.450–4.500)

## 2022-02-11 LAB — T4, FREE: Free T4: 1.04 ng/dL (ref 0.82–1.77)

## 2022-02-24 ENCOUNTER — Encounter: Payer: Self-pay | Admitting: "Endocrinology

## 2022-02-24 ENCOUNTER — Ambulatory Visit: Payer: Medicare HMO | Admitting: "Endocrinology

## 2022-02-24 VITALS — BP 100/62 | HR 52 | Ht 62.0 in | Wt 108.4 lb

## 2022-02-24 DIAGNOSIS — M5030 Other cervical disc degeneration, unspecified cervical region: Secondary | ICD-10-CM

## 2022-02-24 DIAGNOSIS — E042 Nontoxic multinodular goiter: Secondary | ICD-10-CM

## 2022-02-24 DIAGNOSIS — E039 Hypothyroidism, unspecified: Secondary | ICD-10-CM | POA: Diagnosis not present

## 2022-02-24 MED ORDER — LEVOTHYROXINE SODIUM 88 MCG PO TABS: 88.0000 ug | ORAL_TABLET | Freq: Every day | ORAL | 1 refills | Status: DC

## 2022-02-24 MED ORDER — VITAMIN D3 125 MCG (5000 UT) PO CAPS: 5000.0000 [IU] | ORAL_CAPSULE | Freq: Every day | ORAL | 1 refills | Status: DC

## 2022-02-24 NOTE — Progress Notes (Signed)
02/24/2022      Endocrinology follow-up note   Subjective:    Patient ID: Evelyn [name], female    DOB: [birth year], PCP Chevis Pretty, FNP   Past Medical History:  Diagnosis Date   Aneurysm [facility])    Aortic insufficiency    Atrial fibrillation ([place])    CAD (coronary artery disease)    CABG LIMA to LAD,vein graft to diagonal,vein graft to OM   Dyslipidemia    GERD (gastroesophageal reflux disease)    Hypothyroid    Past Surgical History:  Procedure Laterality Date   ABDOMINAL HYSTERECTOMY     BACK SURGERY  2009   CORONARY ANGIOPLASTY  2003   cutting balloon atherectomy in-stent stenosis of the LAD.   CORONARY ANGIOPLASTY WITH STENT PLACEMENT  2002   remote PCI & stenting LAD,   CORONARY ARTERY BYPASS GRAFT  2004   LIMA to LAD,vein graft to diagonal,vein graft to OM   FOOT SURGERY     KYPHOPLASTY N/A 11/24/2021   Procedure: LUMBAR ONE KYPHOPLASTY;  Surgeon: Kristeen Miss, MD;  Location: [place];  Service: Neurosurgery;  Laterality: N/A;  LUMBAR ONE KYPHOPLASTY   LEFT HEART CATH AND CORS/GRAFTS ANGIOGRAPHY N/A 02/23/2017   Procedure: LEFT HEART CATH AND CORS/GRAFTS ANGIOGRAPHY;  Surgeon: Wellington Hampshire, MD;  Location: [place] CV LAB;  Service: Cardiovascular;  Laterality: N/A;   LEFT HEART CATHETERIZATION WITH CORONARY ANGIOGRAM N/A 12/15/2011   Procedure: LEFT HEART CATHETERIZATION WITH CORONARY ANGIOGRAM;  Surgeon: Sanda Klein, MD;  Location: [place] CATH LAB;  Service: Cardiovascular;  Laterality: N/A;   LOOP RECORDER INSERTION N/A 06/14/2018   Procedure: LOOP RECORDER INSERTION;  Surgeon: Sanda Klein, MD;  Location: [place] [facility];  Service: Cardiovascular;  Laterality: N/A;   NECK SURGERY     NM MYOCAR PERF WALL MOTION  12/01/2011   normal study-persistent exercise induced ECG changes raises the concern for ischemia (balanced ischemia).   NOSE SURGERY     PACEMAKER INSERTION     TONSILLECTOMY  1948   TUBAL LIGATION     Social History    Socioeconomic History   Marital status: Widowed    Spouse name: Not on file   Number of children: 2   Years of education: Associates   Highest education level: Associate degree: academic program  Occupational History   Occupation: Retired  Tobacco Use   Smoking status: Never   Smokeless tobacco: Never  Vaping Use   Vaping Use: Never used  Substance and Sexual Activity   Alcohol use: No   Drug use: No   Sexual activity: Not Currently  Other Topics Concern   Not on file  Social History Narrative   Mother of 2 with 4 grandchildren   Right-handed   Caffeine: 8-10 cups coffee   Social Determinants of Health   Financial Resource Strain: Low Risk  (02/26/2021)   Overall Financial Resource Strain (CARDIA)    Difficulty of Paying Living Expenses: Not hard at all  Food Insecurity: No Food Insecurity (02/26/2021)   Hunger Vital Sign    Worried About Running Out of Food in the Last Year: Never true    [place] in the Last Year: Never true  Transportation Needs: No Transportation Needs (02/26/2021)   PRAPARE - Hydrologist (Medical): No    Lack of Transportation (Non-Medical): No  Physical Activity: Sufficiently Active (02/26/2021)   Exercise Vital Sign    Days of Exercise per Week: 5 days  Minutes of Exercise per Session: 30 min  Stress: No Stress Concern Present (02/26/2021)   [place]    Feeling of Stress : Only a little  Social Connections: Moderately Integrated (02/26/2021)   Social Connection and Isolation Panel [NHANES]    Frequency of Communication with Friends and Family: More than three times a week    Frequency of Social Gatherings with Friends and Family: Once a week    Attends Religious Services: 1 to 4 times per year    Active Member of [employer] or Organizations: Yes    Attends Archivist Meetings: More than 4 times per year    Marital Status: Widowed    Outpatient Encounter Medications as of 02/24/2022  Medication Sig   Cholecalciferol (VITAMIN D3) 125 MCG (5000 UT) CAPS Take 1 capsule (5,000 Units total) by mouth daily.   apixaban (ELIQUIS) 2.5 MG TABS tablet Take 1 tablet (2.5 mg total) by mouth 2 (two) times daily.   diltiazem (CARDIZEM) 30 MG tablet Take 1 tablet (30 mg total) by mouth every 6 (six) hours as needed (for rapid heart rate).   furosemide (LASIX) 20 MG tablet Take 1 tablet (20 mg total) by mouth daily. (Patient not taking: Reported on 02/02/2022)   HYDROCODONE-ACETAMINOPHEN PO Take by mouth. prn   hydrocortisone 2.5 % cream APPLY TO AFFECTED AREA TWICE A DAY (Patient taking differently: Apply 1 Application topically daily as needed (itching).)   isosorbide mononitrate (IMDUR) 60 MG 24 hr tablet TAKE 1 TABLET (60 MG TOTAL) DAILY BY MOUTH. (Patient taking differently: Take 60 mg by mouth daily.)   levothyroxine (SYNTHROID) 88 MCG tablet Take 1 tablet (88 mcg total) by mouth daily before breakfast.   loratadine (CLARITIN) 10 MG tablet TAKE 1 TABLET BY MOUTH EVERY DAY   methocarbamol (ROBAXIN) 500 MG tablet TAKE 1 TABLET (500 MG TOTAL) BY MOUTH EVERY 12 (TWELVE) HOURS AS NEEDED FOR MUSCLE SPASMS.   metoprolol tartrate (LOPRESSOR) 100 MG tablet Take 1 tablet (100 mg total) by mouth 2 (two) times daily.   nitroGLYCERIN (NITROSTAT) 0.4 MG SL tablet PLACE 1 TABLET UNDER THE TONGUE EVERY 5 MINUTES AS NEEDED FOR CHEST PAIN.   polyethylene glycol (MIRALAX / GLYCOLAX) 17 g packet Take 17 g by mouth daily as needed for mild constipation. (Patient not taking: Reported on 02/24/2022)   potassium chloride (KLOR-CON) 10 MEQ tablet Take 1 tablet (10 mEq total) by mouth daily.   rosuvastatin (CRESTOR) 10 MG tablet Take 1 tablet (10 mg total) by mouth daily. (Patient taking differently: Take 30 mg by mouth daily.)   rosuvastatin (CRESTOR) 20 MG tablet Take 20 mg by mouth daily.   senna (SENOKOT) 8.6 MG TABS tablet Take 1 tablet (8.6 mg total) by  mouth 2 (two) times daily. (Patient not taking: Reported on 02/24/2022)   Zoledronic Acid (RECLAST IV) Inject into the vein. Once yearly   [DISCONTINUED] docusate sodium (COLACE) 100 MG capsule Take 1 capsule (100 mg total) by mouth 2 (two) times daily. (Patient not taking: Reported on 02/02/2022)   [DISCONTINUED] levothyroxine (SYNTHROID) 75 MCG tablet Take 1 tablet (75 mcg total) by mouth daily before breakfast.   [DISCONTINUED] Vitamin D, Ergocalciferol, (DRISDOL) 1.25 MG (50000 UNIT) CAPS capsule Take 1 capsule (50,000 Units total) by mouth every 7 (seven) days. (Patient not taking: Reported on 02/02/2022)   No facility-administered encounter medications on file as of 02/24/2022.   ALLERGIES: Allergies  Allergen Reactions   Sulfa Antibiotics Swelling  VACCINATION STATUS: Immunization History  Administered Date(s) Administered   Fluad Quad(high Dose 65+) 02/29/2020, 03/27/2021   Influenza, High Dose Seasonal PF 03/09/2016, 03/02/2017   Moderna Sars-Covid-2 Vaccination 01/02/2020, 01/30/2020   Pneumococcal Conjugate-13 01/11/2018    HPI  81 year old female with medical history as above. She is being seen in follow-up for management of her hypothyroidism and multinodular goiter.  She remains on levothyroxine 75 mcg p.o. daily before breakfast.  She is compliant and has no side effects.  Her previsit thyroid function tests are consistent with slight under replacement.  - She is status post FNA 3 nodules in her thyroid benign findings.  Her previsit surveillance thyroid/neck ultrasound shows stable findings with no concerning nodules at this time.    -She was recently diagnosed with acute coronary syndrome on medical management. -Her previsit labs are consistent with appropriate replacement.   - She denies family history of thyroid dysfunction. She feels occasional choking sensation in her neck, no recent voice change nor shortness of breath. She denies any exposure to neck radiation. -  She denies any history of smoking.  Review of Systems Limited as above.  Objective:    BP 100/62   Pulse (!) 52   Ht _0  (1.575 m)   Wt 108 lb 6.4 oz (49.2 kg)   BMI 19.83 kg/m   Wt Readings from Last 3 Encounters:  02/24/22 108 lb 6.4 oz (49.2 kg)  02/02/22 107 lb 9.6 oz (48.8 kg)  01/01/22 108 lb (49 kg)    Physical Exam   CMP     Component Value Date/Time   NA 141 01/01/2022 1231   K 4.4 01/01/2022 1231   CL 104 01/01/2022 1231   CO2 18 (L) 01/01/2022 1231   GLUCOSE 92 01/01/2022 1231   GLUCOSE 101 (H) 11/30/2021 0820   BUN 10 01/01/2022 1231   CREATININE 0.70 01/01/2022 1231   CREATININE 0.59 07/11/2014 0913   CALCIUM 9.7 01/01/2022 1231   PROT 7.0 01/01/2022 1231   ALBUMIN 4.5 01/01/2022 1231   AST 16 01/01/2022 1231   ALT 9 01/01/2022 1231   ALKPHOS 60 01/01/2022 1231   BILITOT 0.4 01/01/2022 1231   GFRNONAA >60 11/30/2021 0820   GFRAA 98 06/26/2020 1116   Recent Results (from the past 2160 hour(s))  CBC with Differential     Status: Abnormal   Collection Time: 11/30/21  8:20 AM  Result Value Ref Range   WBC 9.5 4.0 - 10.5 K/uL   RBC 4.02 3.87 - 5.11 MIL/uL   Hemoglobin 12.4 12.0 - 15.0 g/dL   HCT 38.0 36.0 - 46.0 %   MCV 94.5 80.0 - 100.0 fL   MCH 30.8 26.0 - 34.0 pg   MCHC 32.6 30.0 - 36.0 g/dL   RDW 16.0 (H) 11.5 - 15.5 %   Platelets 268 150 - 400 K/uL   nRBC 0.0 0.0 - 0.2 %   Neutrophils Relative % 64 %   Neutro Abs 6.2 1.7 - 7.7 K/uL   Lymphocytes Relative 23 %   Lymphs Abs 2.2 0.7 - 4.0 K/uL   Monocytes Relative 9 %   Monocytes Absolute 0.8 0.1 - 1.0 K/uL   Eosinophils Relative 2 %   Eosinophils Absolute 0.1 0.0 - 0.5 K/uL   Basophils Relative 1 %   Basophils Absolute 0.1 0.0 - 0.1 K/uL   Immature Granulocytes 1 %   Abs Immature Granulocytes 0.07 0.00 - 0.07 K/uL    Comment: Performed at [facility]  [address] [place], [place] [address]  Comprehensive metabolic panel     Status: Abnormal   Collection Time: 11/30/21   8:20 AM  Result Value Ref Range   Sodium 140 135 - 145 mmol/L   Potassium 3.5 3.5 - 5.1 mmol/L   Chloride 112 (H) 98 - 111 mmol/L   CO2 16 (L) 22 - 32 mmol/L   Glucose, Bld 101 (H) 70 - 99 mg/dL    Comment: Glucose reference range applies only to samples taken after fasting for at least 8 hours.   BUN 7 (L) 8 - 23 mg/dL   Creatinine, Ser 0.66 0.44 - 1.00 mg/dL   Calcium 8.0 (L) 8.9 - 10.3 mg/dL   Total Protein 5.5 (L) 6.5 - 8.1 g/dL   Albumin 3.0 (L) 3.5 - 5.0 g/dL   AST 13 (L) 15 - 41 U/L   ALT 12 0 - 44 U/L   Alkaline Phosphatase 46 38 - 126 U/L   Total Bilirubin 0.6 0.3 - 1.2 mg/dL   GFR, Estimated >60 >60 mL/min    Comment: (NOTE) Calculated using the CKD-EPI Creatinine Equation (2021)    Anion gap 12 5 - 15    Comment: Performed at [facility], [place] [address] [place], [place] [address]  Magnesium     Status: None   Collection Time: 11/30/21  8:20 AM  Result Value Ref Range   Magnesium 1.7 1.7 - 2.4 mg/dL    Comment: Performed at [facility] [address] [place], [place] [address]  Troponin I (High Sensitivity)     Status: None   Collection Time: 11/30/21  8:20 AM  Result Value Ref Range   Troponin I (High Sensitivity) 11 <18 ng/L    Comment: (NOTE) Elevated high sensitivity troponin I (hsTnI) values and significant  changes across serial measurements may suggest ACS but many other  chronic and acute conditions are known to elevate hsTnI results.  Refer to the Links section for chest pain algorithms and additional  guidance. Performed at [facility], [place] [address] [place], [place] [address]   Brain natriuretic peptide     Status: None   Collection Time: 11/30/21  8:20 AM  Result Value Ref Range   B Natriuretic Peptide 86.9 0.0 - 100.0 pg/mL    Comment: Performed at [facility] [address], [place], [place] [address]  Protime-INR     Status: Abnormal   Collection Time: 11/30/21  8:20 AM  Result Value Ref Range   Prothrombin  Time 15.5 (H) 11.4 - 15.2 seconds   INR 1.2 0.8 - 1.2    Comment: (NOTE) INR goal varies based on device and disease states. Performed at [facility], [place] [address], [place], [place] [address]   Troponin I (High Sensitivity)     Status: None   Collection Time: 11/30/21 10:07 AM  Result Value Ref Range   Troponin I (High Sensitivity) 14 <18 ng/L    Comment: (NOTE) Elevated high sensitivity troponin I (hsTnI) values and significant  changes across serial measurements may suggest ACS but many other  chronic and acute conditions are known to elevate hsTnI results.  Refer to the "Links" section for chest pain algorithms and additional  guidance. Performed at [facility], [place] [address] [place], [place] [address]   CBC with Differential/Platelet     Status: Abnormal   Collection Time: 01/01/22 12:31 PM  Result Value Ref Range   WBC 8.1  3.4 - 10.8 x10E3/uL   RBC 4.25 3.77 - 5.28 x10E6/uL   Hemoglobin 13.2 11.1 - 15.9 g/dL   Hematocrit 40.8 34.0 - 46.6 %   MCV 96 79 - 97 fL   MCH 31.1 26.6 - 33.0 pg   MCHC 32.4 31.5 - 35.7 g/dL   RDW 15.6 (H) 11.7 - 15.4 %   Platelets 220 150 - 450 x10E3/uL   Neutrophils 47 Not Estab. %   Lymphs 41 Not Estab. %   Monocytes 10 Not Estab. %   Eos 1 Not Estab. %   Basos 1 Not Estab. %   Neutrophils Absolute 3.8 1.4 - 7.0 x10E3/uL   Lymphocytes Absolute 3.4 (H) 0.7 - 3.1 x10E3/uL   Monocytes Absolute 0.8 0.1 - 0.9 x10E3/uL   EOS (ABSOLUTE) 0.1 0.0 - 0.4 x10E3/uL   Basophils Absolute 0.1 0.0 - 0.2 x10E3/uL   Immature Granulocytes 0 Not Estab. %   Immature Grans (Abs) 0.0 0.0 - 0.1 x10E3/uL  CMP14+EGFR     Status: Abnormal   Collection Time: 01/01/22 12:31 PM  Result Value Ref Range   Glucose 92 70 - 99 mg/dL   BUN 10 8 - 27 mg/dL   Creatinine, Ser 0.70 0.57 - 1.00 mg/dL   eGFR 86 >59 mL/min/1.73   BUN/Creatinine Ratio 14 12 - 28   Sodium 141 134 - 144 mmol/L   Potassium 4.4 3.5 - 5.2 mmol/L   Chloride 104 96 - 106 mmol/L    CO2 18 (L) 20 - 29 mmol/L   Calcium 9.7 8.7 - 10.3 mg/dL   Total Protein 7.0 6.0 - 8.5 g/dL   Albumin 4.5 3.7 - 4.7 g/dL   Globulin, Total 2.5 1.5 - 4.5 g/dL   Albumin/Globulin Ratio 1.8 1.2 - 2.2   Bilirubin Total 0.4 0.0 - 1.2 mg/dL   Alkaline Phosphatase 60 44 - 121 IU/L   AST 16 0 - 40 IU/L   ALT 9 0 - 32 IU/L  Lipid panel     Status: None   Collection Time: 01/01/22 12:31 PM  Result Value Ref Range   Cholesterol, Total 154 100 - 199 mg/dL   Triglycerides 87 0 - 149 mg/dL   HDL 60 >39 mg/dL   VLDL Cholesterol Cal 16 5 - 40 mg/dL   LDL Chol Calc (NIH) 78 0 - 99 mg/dL   Chol/HDL Ratio 2.6 0.0 - 4.4 ratio    Comment:                                   T. Chol/HDL Ratio                                             Men  Women                               1/2 Avg.Risk  3.4    3.3                                   Avg.Risk  5.0    4.4  2X Avg.Risk  9.6    7.1                                3X Avg.Risk 23.4   11.0   Thyroid Panel With TSH     Status: None   Collection Time: 01/01/22 12:31 PM  Result Value Ref Range   TSH 2.820 0.450 - 4.500 uIU/mL   T4, Total 9.9 4.5 - 12.0 ug/dL   T3 Uptake Ratio 28 24 - 39 %   Free Thyroxine Index 2.8 1.2 - 4.9  TSH     Status: Abnormal   Collection Time: 02/10/22 10:28 AM  Result Value Ref Range   TSH 8.880 (H) 0.450 - 4.500 uIU/mL  T4, Free     Status: None   Collection Time: 02/10/22 10:28 AM  Result Value Ref Range   Free T4 1.04 0.82 - 1.77 ng/dL   Fine-needle aspiration samples  of 3 nodules in her thyroid is benign  Repeat thyroid ultrasound on April 18, 2019: Right lobe 4 cm x 1.9 mm x 2.6 cm, left lobe 4.1 cm x 1.5 cm x 2.3 cm  There are small stable nodules 3 on the right and one on the left.  2 right-sided nodules and one nodule on the left were previously biopsied with benign findings.   Assessment & Plan:   1.  hypothyroidism 2. Multinodular goiter  -Her previsit thyroid function tests  are consistent with slight under replacement.  I discussed and increase her levothyroxine to 88 mcg p.o. daily before breakfast.     - We discussed about the correct intake of her thyroid hormone, on empty stomach at fasting, with water, separated by at least 30 minutes from breakfast and other medications,  and separated by more than 4 hours from calcium, iron, multivitamins, acid reflux medications (PPIs). -Patient is made aware of the fact that thyroid hormone replacement is needed for life, dose to be adjusted by periodic monitoring of thyroid function tests.    -  Prior Biopsy off  3 nodules is negative for malignancy.  She had stable findings on her surveillance thyroid ultrasound-right thyroid nodule has decreased in size with interval cystic changes which downgrades it from TR 4 to TR 2 category.  She will not need intervention at this time.     - I advised patient to maintain close follow up with Chevis Pretty, FNP for primary care needs.   I spent 21 minutes in the care of the patient today including review of labs from Thyroid Function, CMP, and other relevant labs ; imaging/biopsy records (current and previous including abstractions from other facilities); face-to-face time discussing  her lab results and symptoms, medications doses, her options of short and long term treatment based on the latest standards of care / guidelines;   and documenting the encounter.  Evelyn [name]  participated in the discussions, expressed understanding, and voiced agreement with the above plans.  All questions were answered to her satisfaction. she is encouraged to contact clinic should she have any questions or concerns prior to her return visit.   Follow up plan: Return in about 3 months (around 05/27/2022) for F/U with Pre-visit Labs.  Glade Lloyd, MD Phone: [phone]  Fax: [phone]  -  This note was partially dictated with voice recognition software. Similar sounding words can be  transcribed inadequately or may not  be corrected upon review.  02/24/2022, 5:06 PM

## 2022-02-26 ENCOUNTER — Other Ambulatory Visit: Payer: Self-pay | Admitting: Nurse Practitioner

## 2022-02-26 DIAGNOSIS — M5030 Other cervical disc degeneration, unspecified cervical region: Secondary | ICD-10-CM

## 2022-02-26 NOTE — Telephone Encounter (Signed)
Patient needs an appointment, will not let me refuse.

## 2022-03-05 ENCOUNTER — Other Ambulatory Visit: Payer: Self-pay

## 2022-03-05 ENCOUNTER — Emergency Department (HOSPITAL_COMMUNITY)
Admission: EM | Admit: 2022-03-05 | Discharge: 2022-03-06 | Discharge status: Against Medical Advice | Payer: Medicare HMO | Attending: Emergency Medicine | Admitting: Emergency Medicine

## 2022-03-05 ENCOUNTER — Encounter (HOSPITAL_COMMUNITY): Payer: Self-pay | Admitting: Emergency Medicine

## 2022-03-05 DIAGNOSIS — Z5321 Procedure and treatment not carried out due to patient leaving prior to being seen by health care provider: Secondary | ICD-10-CM | POA: Diagnosis not present

## 2022-03-05 DIAGNOSIS — R0981 Nasal congestion: Secondary | ICD-10-CM | POA: Diagnosis not present

## 2022-03-05 DIAGNOSIS — R110 Nausea: Secondary | ICD-10-CM | POA: Insufficient documentation

## 2022-03-05 DIAGNOSIS — L299 Pruritus, unspecified: Secondary | ICD-10-CM | POA: Diagnosis not present

## 2022-03-05 LAB — CBC
HCT: 39.5 % (ref 36.0–46.0)
Hemoglobin: 13.3 g/dL (ref 12.0–15.0)
MCH: 32 pg (ref 26.0–34.0)
MCHC: 33.7 g/dL (ref 30.0–36.0)
MCV: 95.2 fL (ref 80.0–100.0)
Platelets: 195 10*3/uL (ref 150–400)
RBC: 4.15 MIL/uL (ref 3.87–5.11)
RDW: 13.8 % (ref 11.5–15.5)
WBC: 11.3 10*3/uL — ABNORMAL HIGH (ref 4.0–10.5)
nRBC: 0 % (ref 0.0–0.2)

## 2022-03-05 LAB — BASIC METABOLIC PANEL
Anion gap: 8 (ref 5–15)
BUN: 14 mg/dL (ref 8–23)
CO2: 24 mmol/L (ref 22–32)
Calcium: 9.1 mg/dL (ref 8.9–10.3)
Chloride: 106 mmol/L (ref 98–111)
Creatinine, Ser: 0.86 mg/dL (ref 0.44–1.00)
GFR, Estimated: >60 mL/min (ref >60)
Glucose, Bld: 111 mg/dL — ABNORMAL HIGH (ref 70–99)
Potassium: 3.7 mmol/L (ref 3.5–5.1)
Sodium: 138 mmol/L (ref 135–145)

## 2022-03-05 MED ORDER — ONDANSETRON 4 MG PO TBDP: 4.0000 mg | ORAL_TABLET | Freq: Once | ORAL | Status: DC

## 2022-03-05 NOTE — ED Provider Triage Note (Signed)
Emergency Medicine Provider Triage Evaluation Note  Evelyn [name] , a 81 y.o. female  was evaluated in triage.  Pt complains of nasal congestion, itchy hands.  Patient reports this began around 7 PM, 1 hour prior to arrival.  The patient denies any other allergies that she is aware of.  Patient endorsing nausea without vomiting.  Review of Systems  Positive:  Negative:   Physical Exam  BP (!) 155/67   Pulse 65   Temp 98.2 F (36.8 C)   Resp 16   SpO2 95%  Gen:   Awake, no distress   Resp:  Normal effort  MSK:   Moves extremities without difficulty  Other:    Medical Decision Making  Medically screening exam initiated at 9:33 PM.  Appropriate orders placed.  Evelyn [name] was informed that the remainder of the evaluation will be completed by another provider, this initial triage assessment does not replace that evaluation, and the importance of remaining in the ED until their evaluation is complete.     Evelyn Cecil, PA-C 03/05/22 2133

## 2022-03-05 NOTE — ED Triage Notes (Signed)
Pt c/o nausea, nasal congestion, and itchy hands.

## 2022-03-06 NOTE — ED Notes (Signed)
Pt left [facility] due to long wait times.  

## 2022-03-08 ENCOUNTER — Telehealth: Payer: Self-pay

## 2022-03-08 NOTE — Telephone Encounter (Signed)
Attempted TOC call - paitent left ED with out being seen.  Patient has appointment in office tomorrow with Hassell Done.   Patient states that rash she went to ED for has resolved.

## 2022-03-09 ENCOUNTER — Ambulatory Visit (INDEPENDENT_AMBULATORY_CARE_PROVIDER_SITE_OTHER): Payer: Medicare HMO | Admitting: Nurse Practitioner

## 2022-03-09 ENCOUNTER — Encounter: Payer: Self-pay | Admitting: Nurse Practitioner

## 2022-03-09 VITALS — BP 134/77 | HR 52 | Temp 98.3°F | Resp 20 | Ht 62.0 in | Wt 110.0 lb

## 2022-03-09 DIAGNOSIS — Z09 Encounter for follow-up examination after completed treatment for conditions other than malignant neoplasm: Secondary | ICD-10-CM | POA: Diagnosis not present

## 2022-03-09 DIAGNOSIS — Z91018 Allergy to other foods: Secondary | ICD-10-CM | POA: Diagnosis not present

## 2022-03-09 DIAGNOSIS — S32010D Wedge compression fracture of first lumbar vertebra, subsequent encounter for fracture with routine healing: Secondary | ICD-10-CM | POA: Diagnosis not present

## 2022-03-09 DIAGNOSIS — T7840XA Allergy, unspecified, initial encounter: Secondary | ICD-10-CM

## 2022-03-09 MED ORDER — HYDROCODONE-ACETAMINOPHEN 5-325 MG PO TABS: 1.0000 | ORAL_TABLET | Freq: Two times a day (BID) | ORAL | 0 refills | Status: DC | PRN

## 2022-03-09 NOTE — Patient Instructions (Signed)
Spinal Compression Fracture  A spinal compression fracture is a collapse of the bones that form the spine (vertebrae). With this type of fracture, the vertebrae become pushed (compressed) into a wedge shape. Most compression fractures happen in the middle or lower part of the spine. What are the causes? This condition may be caused by: Thinning and loss of density in the bones (osteoporosis). This is the most common cause. A fall. A car or motorcycle accident. Cancer. Trauma, such as a heavy, direct hit to the head or back. What increases the risk? You are more likely to develop this condition if: You are 24 years of age or older. You have osteoporosis. You have certain types of cancer, including: Multiple myeloma. Lymphoma. Prostate cancer. Lung cancer. Breast cancer. What are the signs or symptoms? Symptoms of this condition include: Severe pain with simple movements such as coughing or sneezing. Pain that gets worse over time. Pain that is worse when you stand, walk, sit, or bend. Sudden pain that is so bad that it is hard for you to move. Bending or humping of the spine. Gradual loss of height. Numbness, tingling, or weakness in the back and legs. Trouble walking. Your symptoms will depend on the cause of the fracture and how quickly it develops. How is this diagnosed? This condition may be diagnosed based on symptoms, medical history, and a physical exam. During the physical exam, your health care provider may tap along the length of your spine to check for tenderness. Tests may be done to confirm the diagnosis. They may include: A bone mineral density test to check for osteoporosis. Imaging tests, such as a spine X-ray, CT scan, or MRI. How is this treated? Treatment depends on the cause and severity of the condition. Some fractures may heal on their own with supportive care. Treatment may include: Pain medicine. Rest. A back brace. Physical therapy exercises. Medicine  to strengthen bone. Calcium and vitamin D supplements. Fractures that cause the back to become misshapen, cause nerve pain or weakness, or do not respond to other treatment may be treated with surgery. This may include: Vertebroplasty. Bone cement is injected into the collapsed vertebrae to stabilize them. Balloon kyphoplasty. The collapsed vertebrae are expanded with a balloon and then bone cement is injected into them. Spinal fusion. The collapsed vertebrae are connected (fused) to normal vertebrae. Follow these instructions at home: Medicines Take over-the-counter and prescription medicines only as told by your health care provider. Ask your health care provider if the medicine prescribed to you: Requires you to avoid driving or using machinery. Can cause constipation. You may need to take these actions to prevent or treat constipation: Drink enough fluid to keep your urine pale yellow. Take over-the-counter or prescription medicines. Eat foods that are high in fiber, such as beans, whole grains, and fresh fruits and vegetables. Limit foods that are high in fat and processed sugars, such as fried or sweet foods. If you have a brace: Wear the brace as told by your health care provider. Remove it only as told by your health care provider. Loosen the brace if your fingers or toes tingle, become numb, or turn cold and blue. Keep the brace clean. If the brace is not waterproof: Do not let it get wet. Cover it with a watertight covering when you take a bath or a shower. Managing pain, stiffness, and swelling  If directed, put ice on the injured area. To do this: If you have a removable brace, remove it as  told by your health care provider. Put ice in a plastic bag. Place a towel between your skin and the bag. Leave the ice on for 20 minutes, 2-3 times a day. Remove the ice if your skin turns bright red. This is very important. If you cannot feel pain, heat, or cold, you have a greater risk  of damage to the area. Activity Rest as told by your health care provider. Avoid sitting for a long time without moving. Get up to take short walks every 1-2 hours. This is important to improve blood flow and breathing. Ask for help if you feel weak or unsteady. Return to your normal activities as told by your health care provider. Ask what activities are safe for you. Do physical therapy exercises to improve movement and strength in your back, as recommended by your health care provider. Exercise regularly as directed by your health care provider. General instructions  Do not drink alcohol. Alcohol can interfere with your treatment. Do not use any products that contain nicotine or tobacco, such as cigarettes, e-cigarettes, and chewing tobacco. These can delay bone healing. If you need help quitting, ask your health care provider. Keep all follow-up visits. This is important. It can help to prevent permanent injury, disability, and long-lasting (chronic) pain. Contact a health care provider if: You have a fever. Your pain medicine is not helping. Your pain does not get better over time. You cannot return to your normal activities as planned or expected. Get help right away if: Your pain is very bad and it suddenly gets worse. You are unable to move any body part (paralysis) that is below the level of your injury. You have numbness, tingling, or weakness in any body part that is below the level of your injury. You cannot control your bladder or bowels. Summary A spinal compression fracture is a collapse of the bones that form the spine (vertebrae). With this type of fracture, the vertebrae become pushed (compressed) into a wedge shape. Your symptoms and treatment will depend on the cause and severity of the fracture and how quickly it develops. Some fractures may heal on their own with supportive care. Fractures that cause the back to become misshapen, cause nerve pain or weakness, or do not  respond to other treatment may be treated with surgery. This information is not intended to replace advice given to you by your health care provider. Make sure you discuss any questions you have with your health care provider. Document Revised: 08/22/2019 Document Reviewed: 08/22/2019 Elsevier Patient Education  [place].

## 2022-03-09 NOTE — Progress Notes (Addendum)
Subjective:    Patient ID: Evelyn [name], female    DOB: 1940-08-11, 81 y.o.   MRN: [id number]   Chief Complaint: Hospitalization Follow-up   HPI Patient went to the ED on Friday evening for a rash, but was not seen. She said she waiting a long time. She says that it started out as feeling like her throat was closing and she developed a rash. The only thing is her thyroid meds were increased. She was feeling better while waiting so she left and was not seen by provider. Has completely resolved  She fractured her back by lifting something on May 31. She had cement injected. She is still having back pain and sh eis till having pain. Would like repeat CT scan to make sure that has healed  Review of Systems  Constitutional:  Negative for diaphoresis.  Eyes:  Negative for pain.  Respiratory:  Negative for shortness of breath.   Cardiovascular:  Negative for chest pain, palpitations and leg swelling.  Gastrointestinal:  Negative for abdominal pain.  Endocrine: Negative for polydipsia.  Skin:  Negative for rash.  Neurological:  Negative for dizziness, weakness and headaches.  Hematological:  Does not bruise/bleed easily.  All other systems reviewed and are negative.      Objective:   Physical Exam Constitutional:      Appearance: Normal appearance.  Cardiovascular:     Rate and Rhythm: Normal rate and regular rhythm.     Heart sounds: Normal heart sounds.  Pulmonary:     Effort: Pulmonary effort is normal.     Breath sounds: Normal breath sounds.  Musculoskeletal:     Comments: In with flexion and extension of lumbar spine (-) SLR bil  Skin:    General: Skin is warm.  Neurological:     General: No focal deficit present.     Mental Status: She is alert and oriented to person, place, and time.  Psychiatric:        Mood and Affect: Mood normal.        Behavior: Behavior normal.    BP 134/77   Pulse (!) 52   Temp 98.3 F (36.8 C) (Temporal)   Resp 20   Ht 5\' 2"  (1.575  m)   Wt 110 lb (49.9 kg)   SpO2 92%   BMI 20.12 kg/m         Assessment & Plan:   [name] in today with chief complaint of Hospitalization Follow-up   1. Compression fracture of L1 vertebra with routine healing, subsequent encounter Will repeat CT scan - CT Lumbar Spine Wo Contrast; Future  2. Hospital discharge follow-up Hospital records reviewed  3. Allergic reaction, initial encounter Probably due to food or drink Need to keep food dairy Benadryl immediately if reoccurs  Meds ordered this encounter  Medications   HYDROcodone-acetaminophen (NORCO/VICODIN) 5-325 MG tablet    Sig: Take 1 tablet by mouth 2 (two) times daily as needed. prn    Dispense:  60 tablet    Refill:  0    Order Specific Question:   Supervising Provider    Answer:   Caryl Pina A [[id number]]      The above assessment and management plan was discussed with the patient. The patient verbalized understanding of and has agreed to the management plan. Patient is aware to call the clinic if symptoms persist or worsen. Patient is aware when to return to the clinic for a follow-up visit. Patient educated on when it is appropriate  to go to the emergency department.   Mary-Margaret Hassell Done, FNP

## 2022-03-09 NOTE — Addendum Note (Signed)
Addended by: Chevis Pretty on: 03/09/2022 12:56 PM   Modules accepted: Orders

## 2022-03-10 ENCOUNTER — Telehealth: Payer: Self-pay | Admitting: Nurse Practitioner

## 2022-03-10 MED ORDER — HYDROCODONE-ACETAMINOPHEN 5-325 MG PO TABS: 1.0000 | ORAL_TABLET | Freq: Two times a day (BID) | ORAL | 0 refills | Status: DC | PRN

## 2022-03-10 NOTE — Telephone Encounter (Signed)
Rify that [facility] has enough in stock for her & I will write it.

## 2022-03-16 ENCOUNTER — Ambulatory Visit: Admission: RE | Admit: 2022-03-16 | Payer: Medicare HMO | Source: Ambulatory Visit

## 2022-03-22 ENCOUNTER — Telehealth: Payer: Self-pay | Admitting: Nurse Practitioner

## 2022-03-22 DIAGNOSIS — E782 Mixed hyperlipidemia: Secondary | ICD-10-CM

## 2022-03-22 NOTE — Telephone Encounter (Signed)
Pt called to check on status of CT order. Explained to pt that per referral notes, referrals has tried contacting her multiple times but the phone just rings and she is unable to LM. Pt apologized and said that she was having issues with her home phone all last week but says it is working now.

## 2022-04-05 ENCOUNTER — Encounter: Payer: Self-pay | Admitting: Nurse Practitioner

## 2022-04-05 ENCOUNTER — Ambulatory Visit: Payer: Medicare HMO | Admitting: Nurse Practitioner

## 2022-04-05 VITALS — BP 111/63 | HR 57 | Temp 97.9°F | Resp 20 | Ht 62.0 in | Wt 109.0 lb

## 2022-04-05 DIAGNOSIS — Z8673 Personal history of transient ischemic attack (TIA), and cerebral infarction without residual deficits: Secondary | ICD-10-CM

## 2022-04-05 DIAGNOSIS — I480 Paroxysmal atrial fibrillation: Secondary | ICD-10-CM | POA: Diagnosis not present

## 2022-04-05 DIAGNOSIS — M5030 Other cervical disc degeneration, unspecified cervical region: Secondary | ICD-10-CM

## 2022-04-05 DIAGNOSIS — E782 Mixed hyperlipidemia: Secondary | ICD-10-CM

## 2022-04-05 DIAGNOSIS — I10 Essential (primary) hypertension: Secondary | ICD-10-CM | POA: Diagnosis not present

## 2022-04-05 DIAGNOSIS — F419 Anxiety disorder, unspecified: Secondary | ICD-10-CM

## 2022-04-05 DIAGNOSIS — E039 Hypothyroidism, unspecified: Secondary | ICD-10-CM

## 2022-04-05 DIAGNOSIS — I25718 Atherosclerosis of autologous vein coronary artery bypass graft(s) with other forms of angina pectoris: Secondary | ICD-10-CM

## 2022-04-05 MED ORDER — DILTIAZEM HCL 30 MG PO TABS: 30.0000 mg | ORAL_TABLET | Freq: Four times a day (QID) | ORAL | 3 refills | Status: DC | PRN

## 2022-04-05 MED ORDER — ISOSORBIDE MONONITRATE ER 60 MG PO TB24: ORAL_TABLET | ORAL | 1 refills | Status: DC

## 2022-04-05 MED ORDER — LEVOTHYROXINE SODIUM 88 MCG PO TABS: 88.0000 ug | ORAL_TABLET | Freq: Every day | ORAL | 1 refills | Status: DC

## 2022-04-05 MED ORDER — METOPROLOL TARTRATE 100 MG PO TABS: 100.0000 mg | ORAL_TABLET | Freq: Two times a day (BID) | ORAL | 1 refills | Status: DC

## 2022-04-05 MED ORDER — ROSUVASTATIN CALCIUM 20 MG PO TABS: 20.0000 mg | ORAL_TABLET | Freq: Every day | ORAL | 1 refills | Status: DC

## 2022-04-05 MED ORDER — POTASSIUM CHLORIDE ER 10 MEQ PO TBCR: 10.0000 meq | EXTENDED_RELEASE_TABLET | Freq: Every day | ORAL | 3 refills | Status: DC

## 2022-04-05 MED ORDER — APIXABAN 2.5 MG PO TABS: 2.5000 mg | ORAL_TABLET | Freq: Two times a day (BID) | ORAL | 1 refills | Status: DC

## 2022-04-05 MED ORDER — FUROSEMIDE 20 MG PO TABS: 20.0000 mg | ORAL_TABLET | Freq: Every day | ORAL | 1 refills | Status: DC

## 2022-04-05 NOTE — Progress Notes (Signed)
Subjective:    Patient ID: Evelyn [name], female    DOB: 10/27/1940, 81 y.o.   MRN: [id number]   Chief Complaint: Medical Management of Chronic Issues    HPI:  Evelyn [name] is a 81 y.o. who identifies as a female who was assigned female at birth.   Social history: Lives with: by herself Work history: retired   Water engineer in today for follow up of the following chronic medical issues:  1. Essential hypertension No c/o chest pain, sob or headache. Does not check blood pressure at home BP Readings from Last 3 Encounters:  04/05/22 111/63  03/09/22 134/77  03/06/22 (!) 152/58     2. Paroxysmal atrial fibrillation (HCC) No palpitations or heart racing  3. 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  4. History of TIA (transient ischemic attack) No permanent effect  5. Acquired hypothyroidism No problems that she is aware of. Sees specialist fo rher treatment Lab Results  Component Value Date   TSH 8.880 (H) 02/10/2022     6. Mixed hyperlipidemia Does try to watch diet and stays very active  7. Anxiety Is on no prescription meds    04/05/2022   12:06 PM 03/09/2022   12:27 PM 01/01/2022   12:09 PM 12/10/2021    8:50 AM  GAD 7 : Generalized Anxiety Score  Nervous, Anxious, on Edge 0 0 0 0  Control/stop worrying 0 0 0 0  Worry too much - different things 0 0 0 0  Trouble relaxing 0 0 0 0  Restless 0 0 0 0  Easily annoyed or irritable 0 0 1 0  Afraid - awful might happen 0 0 0 0  Total GAD 7 Score 0 0 1 0  Anxiety Difficulty Not difficult at all Not difficult at all Not difficult at all Not difficult at all       New complaints: None today  Allergies  Allergen Reactions   Sulfa Antibiotics Swelling   Outpatient Encounter Medications as of 04/05/2022  Medication Sig   apixaban (ELIQUIS) 2.5 MG TABS tablet Take 1 tablet (2.5 mg total) by mouth 2 (two) times  daily.   Cholecalciferol (VITAMIN D3) 125 MCG (5000 UT) CAPS Take 1 capsule (5,000 Units total) by mouth daily.   diltiazem (CARDIZEM) 30 MG tablet Take 1 tablet (30 mg total) by mouth every 6 (six) hours as needed (for rapid heart rate).   furosemide (LASIX) 20 MG tablet Take 1 tablet (20 mg total) by mouth daily.   HYDROcodone-acetaminophen (NORCO/VICODIN) 5-325 MG tablet Take 1 tablet by mouth 2 (two) times daily as needed. prn   hydrocortisone 2.5 % cream APPLY TO AFFECTED AREA TWICE A DAY (Patient taking differently: Apply 1 Application topically daily as needed (itching).)   isosorbide mononitrate (IMDUR) 60 MG 24 hr tablet TAKE 1 TABLET (60 MG TOTAL) DAILY BY MOUTH. (Patient taking differently: Take 60 mg by mouth daily.)   levothyroxine (SYNTHROID) 88 MCG tablet Take 1 tablet (88 mcg total) by mouth daily before breakfast.   loratadine (CLARITIN) 10 MG tablet TAKE 1 TABLET BY MOUTH EVERY DAY   methocarbamol (ROBAXIN) 500 MG tablet TAKE 1 TABLET (500 MG TOTAL) BY MOUTH EVERY 12 (TWELVE) HOURS AS NEEDED FOR MUSCLE SPASMS.   metoprolol tartrate (LOPRESSOR) 100 MG tablet Take 1 tablet (100 mg total) by mouth 2 (two) times daily.   nitroGLYCERIN (NITROSTAT) 0.4 MG SL tablet PLACE 1 TABLET  UNDER THE TONGUE EVERY 5 MINUTES AS NEEDED FOR CHEST PAIN.   polyethylene glycol (MIRALAX / GLYCOLAX) 17 g packet Take 17 g by mouth daily as needed for mild constipation.   potassium chloride (KLOR-CON) 10 MEQ tablet Take 1 tablet (10 mEq total) by mouth daily.   rosuvastatin (CRESTOR) 10 MG tablet Take 1 tablet (10 mg total) by mouth daily. (Patient taking differently: Take 30 mg by mouth daily.)   rosuvastatin (CRESTOR) 20 MG tablet TAKE 1 TABLET BY MOUTH EVERY DAY IN THE EVENING   senna (SENOKOT) 8.6 MG TABS tablet Take 1 tablet (8.6 mg total) by mouth 2 (two) times daily.   Zoledronic Acid (RECLAST IV) Inject into the vein. Once yearly   No facility-administered encounter medications on file as of  04/05/2022.    Past Surgical History:  Procedure Laterality Date   ABDOMINAL HYSTERECTOMY     BACK SURGERY  2009   CORONARY ANGIOPLASTY  2003   cutting balloon atherectomy in-stent stenosis of the LAD.   CORONARY ANGIOPLASTY WITH STENT PLACEMENT  2002   remote PCI & stenting LAD,   CORONARY ARTERY BYPASS GRAFT  2004   LIMA to LAD,vein graft to diagonal,vein graft to OM   FOOT SURGERY     KYPHOPLASTY N/A 11/24/2021   Procedure: LUMBAR ONE KYPHOPLASTY;  Surgeon: Kristeen Miss, MD;  Location: [place];  Service: Neurosurgery;  Laterality: N/A;  LUMBAR ONE KYPHOPLASTY   LEFT HEART CATH AND CORS/GRAFTS ANGIOGRAPHY N/A 02/23/2017   Procedure: LEFT HEART CATH AND CORS/GRAFTS ANGIOGRAPHY;  Surgeon: Wellington Hampshire, MD;  Location: [place] [facility];  Service: Cardiovascular;  Laterality: N/A;   LEFT HEART CATHETERIZATION WITH CORONARY ANGIOGRAM N/A 12/15/2011   Procedure: LEFT HEART CATHETERIZATION WITH CORONARY ANGIOGRAM;  Surgeon: Sanda Klein, MD;  Location: [place] CATH [facility];  Service: Cardiovascular;  Laterality: N/A;   LOOP RECORDER INSERTION N/A 06/14/2018   Procedure: LOOP RECORDER INSERTION;  Surgeon: Sanda Klein, MD;  Location: [place] [facility];  Service: Cardiovascular;  Laterality: N/A;   NECK SURGERY     NM MYOCAR PERF WALL MOTION  12/01/2011   normal study-persistent exercise induced ECG changes raises the concern for ischemia (balanced ischemia).   NOSE SURGERY     PACEMAKER INSERTION     TONSILLECTOMY  1948   TUBAL LIGATION      Family History  Problem Relation Age of Onset   Heart attack Mother    Heart failure Father    Hypertension Father    Heart disease Father    Asthma Maternal Grandmother    Heart disease Sister    Stroke Brother    Voice disorder Daughter        vocal dystonia   Stroke Daughter    Stroke Son    Cancer Sister 76      Controlled substance contract: n/a     Review of Systems  Constitutional:  Negative for diaphoresis.  Eyes:   Negative for pain.  Respiratory:  Negative for shortness of breath.   Cardiovascular:  Negative for chest pain, palpitations and leg swelling.  Gastrointestinal:  Negative for abdominal pain.  Endocrine: Negative for polydipsia.  Skin:  Negative for rash.  Neurological:  Negative for dizziness, weakness and headaches.  Hematological:  Does not bruise/bleed easily.  All other systems reviewed and are negative.      Objective:   Physical Exam Vitals and nursing note reviewed.  Constitutional:      General: She is not in acute distress.  Appearance: Normal appearance. She is well-developed.  HENT:     Head: Normocephalic.     Right Ear: Tympanic membrane normal.     Left Ear: Tympanic membrane normal.     Nose: Nose normal.     Mouth/Throat:     Mouth: Mucous membranes are moist.  Eyes:     Pupils: Pupils are equal, round, and reactive to light.  Neck:     Vascular: No carotid bruit or JVD.  Cardiovascular:     Rate and Rhythm: Normal rate and regular rhythm.     Heart sounds: Normal heart sounds.  Pulmonary:     Effort: Pulmonary effort is normal. No respiratory distress.     Breath sounds: Normal breath sounds. No wheezing or rales.  Chest:     Chest wall: No tenderness.  Abdominal:     General: Bowel sounds are normal. There is no distension or abdominal bruit.     Palpations: Abdomen is soft. There is no hepatomegaly, splenomegaly, mass or pulsatile mass.     Tenderness: There is no abdominal tenderness.  Musculoskeletal:        General: Normal range of motion.     Cervical back: Normal range of motion and neck supple.  Lymphadenopathy:     Cervical: No cervical adenopathy.  Skin:    General: Skin is warm and dry.  Neurological:     Mental Status: She is alert and oriented to person, place, and time.     Deep Tendon Reflexes: Reflexes are normal and symmetric.  Psychiatric:        Behavior: Behavior normal.        Thought Content: Thought content normal.         Judgment: Judgment normal.    BP 111/63   Pulse (!) 57   Temp 97.9 F (36.6 C) (Temporal)   Resp 20   Ht 5\' 2"  (1.575 m)   Wt 109 lb (49.4 kg)   SpO2 98%   BMI 19.94 kg/m         Assessment & Plan:   Evelyn [name] comes in today with chief complaint of Medical Management of Chronic Issues   Diagnosis and orders addressed:  1. Essential hypertension Low sodium diet - furosemide (LASIX) 20 MG tablet; Take 1 tablet (20 mg total) by mouth daily.  Dispense: 90 tablet; Refill: 1  2. Paroxysmal atrial fibrillation (HCC) Avoid caffeine - apixaban (ELIQUIS) 2.5 MG TABS tablet; Take 1 tablet (2.5 mg total) by mouth 2 (two) times daily.  Dispense: 180 tablet; Refill: 1 - metoprolol tartrate (LOPRESSOR) 100 MG tablet; Take 1 tablet (100 mg total) by mouth 2 (two) times daily.  Dispense: 180 tablet; Refill: 1 - diltiazem (CARDIZEM) 30 MG tablet; Take 1 tablet (30 mg total) by mouth every 6 (six) hours as needed (for rapid heart rate).  Dispense: 30 tablet; Refill: 3  3. 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  Dispense: 90 tablet; Refill: 1  4. History of TIA (transient ischemic attack)  5. Acquired hypothyroidism Labs pending - levothyroxine (SYNTHROID) 88 MCG tablet; Take 1 tablet (88 mcg total) by mouth daily before breakfast.  Dispense: 90 tablet; Refill: 1  6. Mixed hyperlipidemia Low fat diet - rosuvastatin (CRESTOR) 20 MG tablet; Take 1 tablet (20 mg total) by mouth daily.  Dispense: 90 tablet;  Refill: 1  7. Anxiety Stress management  8. DDD (degenerative disc disease), cervical   Labs pending Health Maintenance reviewed Diet and exercise encouraged  Follow up plan: 6 months   [place], FNP

## 2022-04-28 ENCOUNTER — Ambulatory Visit (HOSPITAL_COMMUNITY)
Admission: RE | Admit: 2022-04-28 | Discharge: 2022-04-28 | Disposition: A | Discharge status: Home / Self Care | Payer: Medicare HMO | Source: Ambulatory Visit | Attending: Nurse Practitioner | Admitting: Nurse Practitioner

## 2022-04-28 DIAGNOSIS — S32010D Wedge compression fracture of first lumbar vertebra, subsequent encounter for fracture with routine healing: Secondary | ICD-10-CM | POA: Diagnosis not present

## 2022-05-07 ENCOUNTER — Other Ambulatory Visit: Payer: Self-pay | Admitting: Nurse Practitioner

## 2022-05-11 ENCOUNTER — Ambulatory Visit: Payer: Medicare HMO | Admitting: Nurse Practitioner

## 2022-05-11 ENCOUNTER — Encounter: Payer: Self-pay | Admitting: Nurse Practitioner

## 2022-05-11 VITALS — BP 119/66 | HR 55 | Temp 97.8°F | Resp 20 | Ht 62.0 in | Wt 110.0 lb

## 2022-05-11 DIAGNOSIS — M5030 Other cervical disc degeneration, unspecified cervical region: Secondary | ICD-10-CM | POA: Diagnosis not present

## 2022-05-11 DIAGNOSIS — Z23 Encounter for immunization: Secondary | ICD-10-CM

## 2022-05-11 MED ORDER — HYDROCODONE-ACETAMINOPHEN 5-325 MG PO TABS: 1.0000 | ORAL_TABLET | Freq: Two times a day (BID) | ORAL | 0 refills | Status: AC | PRN

## 2022-05-11 NOTE — Patient Instructions (Signed)

## 2022-05-11 NOTE — Progress Notes (Signed)
Subjective:    Patient ID: Evelyn [name], female    DOB: August 21, 1940, 81 y.o.   MRN: [id number]   Chief Complaint: Pain Management   Patient broke her back I May of this year and pain has worsened since then.   Pain assessment: Cause of pain- DDD Pain location- mainly in lower back Pain on scale of 1-10- 5/10 while sitting Frequency- daily What increases pain-nothing  What makes pain Better-rest helps Effects on ADL - none Any change in general medical condition-none  Current opioids rx- norco 5/325 BID # meds rx- 60 Effectiveness of current meds-helps Adverse reactions from pain meds-none Morphine equivalent- 10 MME  Pill count performed-No Last drug screen - 09/29/21- no meds in her system ( high risk q53m, moderate risk q52m, low risk yearly ) Urine drug screen today- No Was the N[facility]SR reviewed- yes  If yes were their any concerning findings? - no   Overdose risk:    Pain contract signed on: 10/06/21     Review of Systems  Constitutional:  Negative for diaphoresis.  Eyes:  Negative for pain.  Respiratory:  Negative for shortness of breath.   Cardiovascular:  Negative for chest pain, palpitations and leg swelling.  Gastrointestinal:  Negative for abdominal pain.  Endocrine: Negative for polydipsia.  Skin:  Negative for rash.  Neurological:  Negative for dizziness, weakness and headaches.  Hematological:  Does not bruise/bleed easily.  All other systems reviewed and are negative.      Objective:   Physical Exam Constitutional:      Appearance: Normal appearance.  Cardiovascular:     Rate and Rhythm: Normal rate and regular rhythm.     Heart sounds: Normal heart sounds.  Pulmonary:     Effort: Pulmonary effort is normal.     Breath sounds: Normal breath sounds.  Musculoskeletal:     Comments: Mid low back pain that will radiate down right leg occasionally. She also has some neck pain intermittently Pain when going from sitting to standing (-) SLR   Neurological:     Mental Status: She is alert.      BP 119/66   Pulse (!) 55   Temp 97.8 F (36.6 C) (Temporal)   Resp 20   Ht 5\' 2"  (1.575 m)   Wt 110 lb (49.9 kg)   SpO2 94%   BMI 20.12 kg/m       Assessment & Plan:   in today with chief complaint of Pain Management   1. DDD (degenerative disc disease), cervical Moist heat Rest Meds ordered this encounter  Medications   HYDROcodone-acetaminophen (NORCO/VICODIN) 5-325 MG tablet    Sig: Take 1 tablet by mouth 2 (two) times daily as needed. prn    Dispense:  60 tablet    Refill:  0    Order Specific Question:   Supervising Provider    Answer:   [name] A [[id number]]   HYDROcodone-acetaminophen (NORCO/VICODIN) 5-325 MG tablet    Sig: Take 1 tablet by mouth 2 (two) times daily as needed. prn    Dispense:  60 tablet    Refill:  0    Order Specific Question:   Supervising Provider    Answer:   Arville Care A [[id number]]   HYDROcodone-acetaminophen (NORCO/VICODIN) 5-325 MG tablet    Sig: Take 1 tablet by mouth 2 (two) times daily as needed. prn    Dispense:  60 tablet    Refill:  0    Order Specific Question:  Supervising Provider    Answer:   Worthy Rancher [[id number]]       The above assessment and management plan was discussed with the patient. The patient verbalized understanding of and has agreed to the management plan. Patient is aware to call the clinic if symptoms persist or worsen. Patient is aware when to return to the clinic for a follow-up visit. Patient educated on when it is appropriate to go to the emergency department.   Mary-Margaret Hassell Done, FNP

## 2022-05-24 ENCOUNTER — Other Ambulatory Visit: Payer: Medicare HMO

## 2022-05-25 LAB — LIPID PANEL
Chol/HDL Ratio: 2.2 ratio (ref 0.0–4.4)
Cholesterol, Total: 139 mg/dL (ref 100–199)
HDL: 63 mg/dL (ref >39)
LDL Chol Calc (NIH): 60 mg/dL (ref 0–99)
Triglycerides: 86 mg/dL (ref 0–149)
VLDL Cholesterol Cal: 16 mg/dL (ref 5–40)

## 2022-05-25 LAB — TSH: TSH: 1.1 u[IU]/mL (ref 0.450–4.500)

## 2022-05-25 LAB — VITAMIN D 25 HYDROXY (VIT D DEFICIENCY, FRACTURES): Vit D, 25-Hydroxy: 50.4 ng/mL (ref 30.0–100.0)

## 2022-05-25 LAB — T4, FREE: Free T4: 1.41 ng/dL (ref 0.82–1.77)

## 2022-05-26 ENCOUNTER — Telehealth: Payer: Self-pay | Admitting: Nurse Practitioner

## 2022-05-26 ENCOUNTER — Encounter (HOSPITAL_COMMUNITY): Payer: Medicare HMO

## 2022-05-26 NOTE — Telephone Encounter (Signed)
Patient aware of results.

## 2022-05-27 ENCOUNTER — Encounter: Payer: Self-pay | Admitting: "Endocrinology

## 2022-05-27 ENCOUNTER — Ambulatory Visit: Payer: Medicare HMO | Admitting: "Endocrinology

## 2022-05-27 VITALS — BP 108/66 | HR 52 | Ht 62.0 in | Wt 109.6 lb

## 2022-05-27 DIAGNOSIS — E039 Hypothyroidism, unspecified: Secondary | ICD-10-CM

## 2022-05-27 DIAGNOSIS — E782 Mixed hyperlipidemia: Secondary | ICD-10-CM | POA: Diagnosis not present

## 2022-05-27 DIAGNOSIS — E042 Nontoxic multinodular goiter: Secondary | ICD-10-CM | POA: Diagnosis not present

## 2022-05-27 NOTE — Progress Notes (Signed)
05/27/2022      Endocrinology follow-up note   Subjective:    Patient ID: Evelyn [name], female    DOB: [birth year], PCP Chevis Pretty, FNP   Past Medical History:  Diagnosis Date   Aneurysm [facility])    Aortic insufficiency    Atrial fibrillation ([place])    CAD (coronary artery disease)    CABG LIMA to LAD,vein graft to diagonal,vein graft to OM   Dyslipidemia    Essential hypertension 09/16/2013   GERD (gastroesophageal reflux disease)    Hypothyroid    Past Surgical History:  Procedure Laterality Date   ABDOMINAL HYSTERECTOMY     BACK SURGERY  2009   CORONARY ANGIOPLASTY  2003   cutting balloon atherectomy in-stent stenosis of the LAD.   CORONARY ANGIOPLASTY WITH STENT PLACEMENT  2002   remote PCI & stenting LAD,   CORONARY ARTERY BYPASS GRAFT  2004   LIMA to LAD,vein graft to diagonal,vein graft to OM   FOOT SURGERY     KYPHOPLASTY N/A 11/24/2021   Procedure: LUMBAR ONE KYPHOPLASTY;  Surgeon: Kristeen Miss, MD;  Location: [place];  Service: Neurosurgery;  Laterality: N/A;  LUMBAR ONE KYPHOPLASTY   LEFT HEART CATH AND CORS/GRAFTS ANGIOGRAPHY N/A 02/23/2017   Procedure: LEFT HEART CATH AND CORS/GRAFTS ANGIOGRAPHY;  Surgeon: Wellington Hampshire, MD;  Location: [place] [facility];  Service: Cardiovascular;  Laterality: N/A;   LEFT HEART CATHETERIZATION WITH CORONARY ANGIOGRAM N/A 12/15/2011   Procedure: LEFT HEART CATHETERIZATION WITH CORONARY ANGIOGRAM;  Surgeon: Sanda Klein, MD;  Location: [place] [facility];  Service: Cardiovascular;  Laterality: N/A;   LOOP RECORDER INSERTION N/A 06/14/2018   Procedure: LOOP RECORDER INSERTION;  Surgeon: Sanda Klein, MD;  Location: [place] [facility];  Service: Cardiovascular;  Laterality: N/A;   NECK SURGERY     NM MYOCAR PERF WALL MOTION  12/01/2011   normal study-persistent exercise induced ECG changes raises the concern for ischemia (balanced ischemia).   NOSE SURGERY     PACEMAKER INSERTION     TONSILLECTOMY  1948   TUBAL  LIGATION     Social History   Socioeconomic History   Marital status: Widowed    Spouse name: Not on file   Number of children: 2   Years of education: Associates   Highest education level: Associate degree: academic program  Occupational History   Occupation: Retired  Tobacco Use   Smoking status: Never   Smokeless tobacco: Never  Vaping Use   Vaping Use: Never used  Substance and Sexual Activity   Alcohol use: No   Drug use: No   Sexual activity: Not Currently  Other Topics Concern   Not on file  Social History Narrative   Mother of 2 with 4 grandchildren   Right-handed   Caffeine: 8-10 cups coffee   Social Determinants of Health   Financial Resource Strain: Low Risk  (02/26/2021)   Overall Financial Resource Strain (CARDIA)    Difficulty of Paying Living Expenses: Not hard at all  Food Insecurity: No Food Insecurity (02/26/2021)   Hunger Vital Sign    Worried About Running Out of Food in the Last Year: Never true    [place] in the Last Year: Never true  Transportation Needs: No Transportation Needs (02/26/2021)   PRAPARE - Hydrologist (Medical): No    Lack of Transportation (Non-Medical): No  Physical Activity: Sufficiently Active (02/26/2021)   Exercise Vital Sign    Days of Exercise per  Week: 5 days    Minutes of Exercise per Session: 30 min  Stress: No Stress Concern Present (02/26/2021)   [employer] of Occupational Health - Occupational Stress Questionnaire    Feeling of Stress : Only a little  Social Connections: Moderately Integrated (02/26/2021)   Social Connection and Isolation Panel [NHANES]    Frequency of Communication with Friends and Family: More than three times a week    Frequency of Social Gatherings with Friends and Family: Once a week    Attends Religious Services: 1 to 4 times per year    Active Member of [employer] or Organizations: Yes    Attends Banker Meetings: More than 4 times per  year    Marital Status: Widowed   Outpatient Encounter Medications as of 05/27/2022  Medication Sig   rosuvastatin (CRESTOR) 10 MG tablet Take 10 mg by mouth daily.   apixaban (ELIQUIS) 2.5 MG TABS tablet Take 1 tablet (2.5 mg total) by mouth 2 (two) times daily.   Cholecalciferol (VITAMIN D3) 125 MCG (5000 UT) CAPS Take 1 capsule (5,000 Units total) by mouth daily.   diltiazem (CARDIZEM) 30 MG tablet Take 1 tablet (30 mg total) by mouth every 6 (six) hours as needed (for rapid heart rate).   furosemide (LASIX) 20 MG tablet Take 1 tablet (20 mg total) by mouth daily.   [START ON 07/10/2022] HYDROcodone-acetaminophen (NORCO/VICODIN) 5-325 MG tablet Take 1 tablet by mouth 2 (two) times daily as needed. prn   [START ON 06/10/2022] HYDROcodone-acetaminophen (NORCO/VICODIN) 5-325 MG tablet Take 1 tablet by mouth 2 (two) times daily as needed. prn   HYDROcodone-acetaminophen (NORCO/VICODIN) 5-325 MG tablet Take 1 tablet by mouth 2 (two) times daily as needed. prn   hydrocortisone 2.5 % cream APPLY TO AFFECTED AREA TWICE A DAY (Patient taking differently: Apply 1 Application topically daily as needed (itching).)   isosorbide mononitrate (IMDUR) 60 MG 24 hr tablet TAKE 1 TABLET (60 MG TOTAL) DAILY BY MOUTH.   levothyroxine (SYNTHROID) 88 MCG tablet Take 1 tablet (88 mcg total) by mouth daily before breakfast.   loratadine (CLARITIN) 10 MG tablet TAKE 1 TABLET BY MOUTH EVERY DAY   methocarbamol (ROBAXIN) 500 MG tablet TAKE 1 TABLET (500 MG TOTAL) BY MOUTH EVERY 12 (TWELVE) HOURS AS NEEDED FOR MUSCLE SPASMS.   metoprolol tartrate (LOPRESSOR) 100 MG tablet Take 1 tablet (100 mg total) by mouth 2 (two) times daily.   nitroGLYCERIN (NITROSTAT) 0.4 MG SL tablet PLACE 1 TABLET UNDER THE TONGUE EVERY 5 MINUTES AS NEEDED FOR CHEST PAIN.   polyethylene glycol (MIRALAX / GLYCOLAX) 17 g packet Take 17 g by mouth daily as needed for mild constipation.   potassium chloride (KLOR-CON) 10 MEQ tablet Take 1 tablet (10 mEq  total) by mouth daily.   rosuvastatin (CRESTOR) 20 MG tablet Take 1 tablet (20 mg total) by mouth daily.   senna (SENOKOT) 8.6 MG TABS tablet Take 1 tablet (8.6 mg total) by mouth 2 (two) times daily.   Zoledronic Acid (RECLAST IV) Inject into the vein. Once yearly   No facility-administered encounter medications on file as of 05/27/2022.   ALLERGIES: Allergies  Allergen Reactions   Sulfa Antibiotics Swelling   VACCINATION STATUS: Immunization History  Administered Date(s) Administered   Fluad Quad(high Dose 65+) 02/29/2020, 03/27/2021, 05/11/2022   Influenza, High Dose Seasonal PF 03/09/2016, 03/02/2017   Moderna Sars-Covid-2 Vaccination 01/02/2020, 01/30/2020   Pneumococcal Conjugate-13 01/11/2018    HPI  82 year old female with medical history as above. She is  being seen in follow-up for management of her hypothyroidism and multinodular goiter.  She remains on levothyroxine 88 mcg p.o. daily before breakfast.   She is compliant and has no side effects.  Her previsit thyroid function tests are consistent with slight under replacement.  - She is status post FNA 3 nodules in her thyroid benign findings.  Her previsit surveillance thyroid/neck ultrasound shows stable findings with no concerning nodules at this time.    -She was recently diagnosed with acute coronary syndrome on medical management. -Her previsit labs are consistent with appropriate replacement.  She has hyperlipidemia currently on Crestor 30 mg p.o. nightly. - She denies family history of thyroid dysfunction. She feels occasional choking sensation in her neck, no recent voice change nor shortness of breath. She denies any exposure to neck radiation. - She denies any history of smoking.  Review of Systems Limited as above.  Objective:    BP 108/66   Pulse (!) 52   Ht 5\' 2"  (1.575 m)   Wt 109 lb 9.6 oz (49.7 kg)   BMI 20.05 kg/m   Wt Readings from Last 3 Encounters:  05/27/22 109 lb 9.6 oz (49.7 kg)  05/11/22  110 lb (49.9 kg)  04/05/22 109 lb (49.4 kg)    Physical Exam   CMP     Component Value Date/Time   NA 138 03/05/2022 2143   NA 141 01/01/2022 1231   K 3.7 03/05/2022 2143   CL 106 03/05/2022 2143   CO2 24 03/05/2022 2143   GLUCOSE 111 (H) 03/05/2022 2143   BUN 14 03/05/2022 2143   BUN 10 01/01/2022 1231   CREATININE 0.86 03/05/2022 2143   CREATININE 0.59 07/11/2014 0913   CALCIUM 9.1 03/05/2022 2143   PROT 7.0 01/01/2022 1231   ALBUMIN 4.5 01/01/2022 1231   AST 16 01/01/2022 1231   ALT 9 01/01/2022 1231   ALKPHOS 60 01/01/2022 1231   BILITOT 0.4 01/01/2022 1231   GFRNONAA >60 03/05/2022 2143   GFRAA 98 06/26/2020 1116   Recent Results (from the past 2160 hour(s))  CBC     Status: Abnormal   Collection Time: 03/05/22  9:43 PM  Result Value Ref Range   WBC 11.3 (H) 4.0 - 10.5 K/uL   RBC 4.15 3.87 - 5.11 MIL/uL   Hemoglobin 13.3 12.0 - 15.0 g/dL   HCT 39.5 36.0 - 46.0 %   MCV 95.2 80.0 - 100.0 fL   MCH 32.0 26.0 - 34.0 pg   MCHC 33.7 30.0 - 36.0 g/dL   RDW 13.8 11.5 - 15.5 %   Platelets 195 150 - 400 K/uL   nRBC 0.0 0.0 - 0.2 %    Comment: Performed at [facility], [place] [address], [place], [place] [address]  Basic metabolic panel     Status: Abnormal   Collection Time: 03/05/22  9:43 PM  Result Value Ref Range   Sodium 138 135 - 145 mmol/L   Potassium 3.7 3.5 - 5.1 mmol/L   Chloride 106 98 - 111 mmol/L   CO2 24 22 - 32 mmol/L   Glucose, Bld 111 (H) 70 - 99 mg/dL    Comment: Glucose reference range applies only to samples taken after fasting for at least 8 hours.   BUN 14 8 - 23 mg/dL   Creatinine, Ser 0.86 0.44 - 1.00 mg/dL   Calcium 9.1 8.9 - 10.3 mg/dL   GFR, Estimated >60 >60 mL/min    Comment: (NOTE) Calculated using the CKD-EPI Creatinine Equation (  2021)    Anion gap 8 5 - 15    Comment: Performed at [facility], [address], [place], [place] [address]  TSH     Status: None   Collection Time: 05/24/22  9:06 AM  Result Value Ref Range    TSH 1.100 0.450 - 4.500 uIU/mL  T4, free     Status: None   Collection Time: 05/24/22  9:06 AM  Result Value Ref Range   Free T4 1.41 0.82 - 1.77 ng/dL  VITAMIN D 25 Hydroxy (Vit-D Deficiency, Fractures)     Status: None   Collection Time: 05/24/22  9:06 AM  Result Value Ref Range   Vit D, 25-Hydroxy 50.4 30.0 - 100.0 ng/mL    Comment: Vitamin D deficiency has been defined by the Institute of Medicine and an Endocrine Society practice guideline as a level of serum 25-OH vitamin D less than 20 ng/mL (1,2). The Endocrine Society went on to further define vitamin D insufficiency as a level between 21 and 29 ng/mL (2). 1. IOM (Institute of Medicine). 2010. Dietary reference    intakes for calcium and D. [place]: The    [employer]. 2. Holick MF, Binkley Hooper, Bischoff-Ferrari HA, et al.    Evaluation, treatment, and prevention of vitamin D    deficiency: an Endocrine Society clinical practice    guideline. JCEM. 2011 Jul; 96(7):1911-30.   Lipid panel     Status: None   Collection Time: 05/24/22  9:06 AM  Result Value Ref Range   Cholesterol, Total 139 100 - 199 mg/dL   Triglycerides 86 0 - 149 mg/dL   HDL 63 >27 mg/dL   VLDL Cholesterol Cal 16 5 - 40 mg/dL   LDL Chol Calc (NIH) 60 0 - 99 mg/dL   Chol/HDL Ratio 2.2 0.0 - 4.4 ratio    Comment:                                   T. Chol/HDL Ratio                                             Men  Women                               1/2 Avg.Risk  3.4    3.3                                   Avg.Risk  5.0    4.4                                2X Avg.Risk  9.6    7.1                                3X Avg.Risk 23.4   11.0    Fine-needle aspiration samples  of 3 nodules in her thyroid is benign  Repeat thyroid ultrasound on April 18, 2019: Right lobe 4 cm x 1.9 mm x 2.6 cm, left lobe 4.1 cm x 1.5 cm x  2.3 cm  There are small stable nodules 3 on the right and one on the left.  2 right-sided nodules and one nodule on  the left were previously biopsied with benign findings.   Assessment & Plan:   1.  hypothyroidism 2. Multinodular goiter 3.  Hyperlipidemia  -Her previsit thyroid function tests are consistent with appropriate replacement.  She is advised to continue levothyroxine 88 mcg p.o. daily before breakfast.     - We discussed about the correct intake of her thyroid hormone, on empty stomach at fasting, with water, separated by at least 30 minutes from breakfast and other medications,  and separated by more than 4 hours from calcium, iron, multivitamins, acid reflux medications (PPIs). -Patient is made aware of the fact that thyroid hormone replacement is needed for life, dose to be adjusted by periodic monitoring of thyroid function tests.     -  Prior Biopsy off  3 nodules is negative for malignancy.  She had stable findings on her surveillance thyroid ultrasound-right thyroid nodule has decreased in size with interval cystic changes which downgrades it from TR 4 to TR 2 category.  She will not need intervention at this time.    Considering her well-controlled lipid panel with LDL of 60, she may benefit from de-escalation of statin treatment.  I advised her to lower her Crestor to 20 mg p.o. nightly.  - I advised patient to maintain close follow up with Bennie Pierini, FNP for primary care needs.   I spent 21 minutes in the care of the patient today including review of labs from Thyroid Function, CMP, and other relevant labs ; imaging/biopsy records (current and previous including abstractions from other facilities); face-to-face time discussing  her lab results and symptoms, medications doses, her options of short and long term treatment based on the latest standards of care / guidelines;   and documenting the encounter.  [name]  participated in the discussions, expressed understanding, and voiced agreement with the above plans.  All questions were answered to her satisfaction.  she is encouraged to contact clinic should she have any questions or concerns prior to her return visit.   Follow up plan: Return in about 6 months (around 11/25/2022) for Fasting Labs  in AM B4 8.  Marquis Lunch, MD Phone: [phone]  Fax: [phone]  -  This note was partially dictated with voice recognition software. Similar sounding words can be transcribed inadequately or may not  be corrected upon review.  05/27/2022, 12:48 PM

## 2022-06-04 ENCOUNTER — Telehealth: Payer: Self-pay | Admitting: Cardiovascular Disease

## 2022-06-04 NOTE — Telephone Encounter (Signed)
Patient asked if she can stop taking rosuvastatin 10mg  and stay on the 20mg  tablet only. She said we can leave a message on her answering machine/voicemail.

## 2022-06-04 NOTE — Telephone Encounter (Signed)
Pt c/o medication issue:  1. Name of Medication: rosuvastatin (CRESTOR) 10 MG tablet   2. How are you currently taking this medication (dosage and times per day)?   Take 10 mg by mouth daily    3. Are you having a reaction (difficulty breathing--STAT)? no  4. What is your medication issue? Patient calling in bout medication see if the extra dosage is needed. Please advise

## 2022-06-05 NOTE — Telephone Encounter (Signed)
Stay on rosuvastatin 20 mg daily

## 2022-06-07 NOTE — Telephone Encounter (Signed)
Patient informed that she is to take rosuvastatin 20mg  daily. She verbalized understanding.  Judson Roch, RN

## 2022-06-21 ENCOUNTER — Ambulatory Visit (INDEPENDENT_AMBULATORY_CARE_PROVIDER_SITE_OTHER): Payer: Medicare HMO

## 2022-06-21 VITALS — Ht 62.0 in | Wt 109.0 lb

## 2022-06-21 DIAGNOSIS — Z0000 Encounter for general adult medical examination without abnormal findings: Secondary | ICD-10-CM | POA: Diagnosis not present

## 2022-06-21 DIAGNOSIS — Z780 Asymptomatic menopausal state: Secondary | ICD-10-CM

## 2022-06-21 NOTE — Patient Instructions (Signed)
Evelyn [name] , Thank you for taking time to come for your Medicare Wellness Visit. I appreciate your ongoing commitment to your health goals. Please review the following plan we discussed and let me know if I can assist you in the future.   These are the goals we discussed:  Goals       DIET - EAT MORE FRUITS AND VEGETABLES (pt-stated)      Cut out snacking on salty foods and try more healthy options.      Exercise 150 min/wk Moderate Activity        This is a list of the screening recommended for you and due dates:  Health Maintenance  Topic Date Due   DTaP/Tdap/Td vaccine (1 - Tdap) Never done   COVID-19 Vaccine (3 - 2023-24 season) 01/15/2022   Zoster (Shingles) Vaccine (1 of 2) 07/06/2022*   DEXA scan (bone density measurement)  01/02/2023*   Pneumonia Vaccine (2 - PPSV23 or PCV20) 04/06/2023*   Medicare Annual Wellness Visit  06/22/2023   Flu Shot  Completed   HPV Vaccine  Aged Out  *Topic was postponed. The date shown is not the original due date.    Advanced directives: Please bring a copy of your health care power of attorney and living will to the office to be added to your chart at your convenience.   Conditions/risks identified: Aim for 30 minutes of exercise or brisk walking, 6-8 glasses of water, and 5 servings of fruits and vegetables each day.   Next appointment: Follow up in one year for your annual wellness visit    Preventive Care 65 Years and Older, Female Preventive care refers to lifestyle choices and visits with your health care provider that can promote health and wellness. What does preventive care include? A yearly physical exam. This is also called an annual well check. Dental exams once or twice a year. Routine eye exams. Ask your health care provider how often you should have your eyes checked. Personal lifestyle choices, including: Daily care of your teeth and gums. Regular physical activity. Eating a healthy diet. Avoiding tobacco and drug  use. Limiting alcohol use. Practicing safe sex. Taking low-dose aspirin every day. Taking vitamin and mineral supplements as recommended by your health care provider. What happens during an annual well check? The services and screenings done by your health care provider during your annual well check will depend on your age, overall health, lifestyle risk factors, and family history of disease. Counseling  Your health care provider may ask you questions about your: Alcohol use. Tobacco use. Drug use. Emotional well-being. Home and relationship well-being. Sexual activity. Eating habits. History of falls. Memory and ability to understand (cognition). Work and work Statistician. Reproductive health. Screening  You may have the following tests or measurements: Height, weight, and BMI. Blood pressure. Lipid and cholesterol levels. These may be checked every 5 years, or more frequently if you are over 88 years old. Skin check. Lung cancer screening. You may have this screening every year starting at age 36 if you have a 30-pack-year history of smoking and currently smoke or have quit within the past 15 years. Fecal occult blood test (FOBT) of the stool. You may have this test every year starting at age 27. Flexible sigmoidoscopy or colonoscopy. You may have a sigmoidoscopy every 5 years or a colonoscopy every 10 years starting at age 1. Hepatitis C blood test. Hepatitis B blood test. Sexually transmitted disease (STD) testing. Diabetes screening. This is done by checking  your blood sugar (glucose) after you have not eaten for a while (fasting). You may have this done every 1-3 years. Bone density scan. This is done to screen for osteoporosis. You may have this done starting at age 40. Mammogram. This may be done every 1-2 years. Talk to your health care provider about how often you should have regular mammograms. Talk with your health care provider about your test results, treatment  options, and if necessary, the need for more tests. Vaccines  Your health care provider may recommend certain vaccines, such as: Influenza vaccine. This is recommended every year. Tetanus, diphtheria, and acellular pertussis (Tdap, Td) vaccine. You may need a Td booster every 10 years. Zoster vaccine. You may need this after age 1. Pneumococcal 13-valent conjugate (PCV13) vaccine. One dose is recommended after age 92. Pneumococcal polysaccharide (PPSV23) vaccine. One dose is recommended after age 1. Talk to your health care provider about which screenings and vaccines you need and how often you need them. This information is not intended to replace advice given to you by your health care provider. Make sure you discuss any questions you have with your health care provider. Document Released: 05/30/2015 Document Revised: 01/21/2016 Document Reviewed: 03/04/2015 Elsevier Interactive Patient Education  2017 [employer] Prevention in the Home Falls can cause injuries. They can happen to people of all ages. There are many things you can do to make your home safe and to help prevent falls. What can I do on the outside of my home? Regularly fix the edges of walkways and driveways and fix any cracks. Remove anything that might make you trip as you walk through a door, such as a raised step or threshold. Trim any bushes or trees on the path to your home. Use bright outdoor lighting. Clear any walking paths of anything that might make someone trip, such as rocks or tools. Regularly check to see if handrails are loose or broken. Make sure that both sides of any steps have handrails. Any raised decks and porches should have guardrails on the edges. Have any leaves, snow, or ice cleared regularly. Use sand or salt on walking paths during winter. Clean up any spills in your garage right away. This includes oil or grease spills. What can I do in the bathroom? Use night lights. Install grab  bars by the toilet and in the tub and shower. Do not use towel bars as grab bars. Use non-skid mats or decals in the tub or shower. If you need to sit down in the shower, use a plastic, non-slip stool. Keep the floor dry. Clean up any water that spills on the floor as soon as it happens. Remove soap buildup in the tub or shower regularly. Attach bath mats securely with double-sided non-slip rug tape. Do not have throw rugs and other things on the floor that can make you trip. What can I do in the bedroom? Use night lights. Make sure that you have a light by your bed that is easy to reach. Do not use any sheets or blankets that are too big for your bed. They should not hang down onto the floor. Have a firm chair that has side arms. You can use this for support while you get dressed. Do not have throw rugs and other things on the floor that can make you trip. What can I do in the kitchen? Clean up any spills right away. Avoid walking on wet floors. Keep items that you use a lot  in easy-to-reach places. If you need to reach something above you, use a strong step stool that has a grab bar. Keep electrical cords out of the way. Do not use floor polish or wax that makes floors slippery. If you must use wax, use non-skid floor wax. Do not have throw rugs and other things on the floor that can make you trip. What can I do with my stairs? Do not leave any items on the stairs. Make sure that there are handrails on both sides of the stairs and use them. Fix handrails that are broken or loose. Make sure that handrails are as long as the stairways. Check any carpeting to make sure that it is firmly attached to the stairs. Fix any carpet that is loose or worn. Avoid having throw rugs at the top or bottom of the stairs. If you do have throw rugs, attach them to the floor with carpet tape. Make sure that you have a light switch at the top of the stairs and the bottom of the stairs. If you do not have them,  ask someone to add them for you. What else can I do to help prevent falls? Wear shoes that: Do not have high heels. Have rubber bottoms. Are comfortable and fit you well. Are closed at the toe. Do not wear sandals. If you use a stepladder: Make sure that it is fully opened. Do not climb a closed stepladder. Make sure that both sides of the stepladder are locked into place. Ask someone to hold it for you, if possible. Clearly mark and make sure that you can see: Any grab bars or handrails. First and last steps. Where the edge of each step is. Use tools that help you move around (mobility aids) if they are needed. These include: Canes. Walkers. Scooters. Crutches. Turn on the lights when you go into a dark area. Replace any light bulbs as soon as they burn out. Set up your furniture so you have a clear path. Avoid moving your furniture around. If any of your floors are uneven, fix them. If there are any pets around you, be aware of where they are. Review your medicines with your doctor. Some medicines can make you feel dizzy. This can increase your chance of falling. Ask your doctor what other things that you can do to help prevent falls. This information is not intended to replace advice given to you by your health care provider. Make sure you discuss any questions you have with your health care provider. Document Released: 02/27/2009 Document Revised: 10/09/2015 Document Reviewed: 06/07/2014 Elsevier Interactive Patient Education  2017 [employer].

## 2022-06-21 NOTE — Progress Notes (Signed)
Subjective:   Evelyn [name] is a 82 y.o. female who presents for Medicare Annual (Subsequent) preventive examination. I connected with  [name] on 06/21/22 by a audio enabled telemedicine application and verified that I am speaking with the correct person using two identifiers.  Patient Location: Home  Provider Location: Home Office  I discussed the limitations of evaluation and management by telemedicine. The patient expressed understanding and agreed to proceed.  Review of Systems     Cardiac Risk Factors include: advanced age (>35men, >64 women)     Objective:    Today's Vitals   06/21/22 1031  Weight: 109 lb (49.4 kg)  Height: 5\' 2"  (1.575 m)   Body mass index is 19.94 kg/m.     06/21/2022   10:36 AM 11/20/2021    1:28 PM 10/14/2021    8:34 AM 02/26/2021   10:42 AM 02/26/2020   10:36 AM 01/18/2019   10:10 AM 01/11/2018   10:37 AM  Advanced Directives  Does Patient Have a Medical Advance Directive? Yes No No Yes Yes Yes Yes  Type of 01/13/2018 of [place];Living will   Healthcare Power of [place];Living will Living will Living will Living will  Does patient want to make changes to medical advance directive?     No - Patient declined No - Patient declined Yes (MAU/Ambulatory/Procedural Areas - Information given)  Copy of Healthcare Power of Attorney in Chart? No - copy requested   No - copy requested   No - copy requested  Would patient like information on creating a medical advance directive?  No - Patient declined         Current Medications (verified) Outpatient Encounter Medications as of 06/21/2022  Medication Sig   apixaban (ELIQUIS) 2.5 MG TABS tablet Take 1 tablet (2.5 mg total) by mouth 2 (two) times daily.   Cholecalciferol (VITAMIN D3) 125 MCG (5000 UT) CAPS Take 1 capsule (5,000 Units total) by mouth daily.   diltiazem (CARDIZEM) 30 MG tablet Take 1 tablet (30 mg total) by mouth every 6 (six) hours as needed (for rapid heart  rate).   furosemide (LASIX) 20 MG tablet Take 1 tablet (20 mg total) by mouth daily.   [START ON 07/10/2022] HYDROcodone-acetaminophen (NORCO/VICODIN) 5-325 MG tablet Take 1 tablet by mouth 2 (two) times daily as needed. prn   HYDROcodone-acetaminophen (NORCO/VICODIN) 5-325 MG tablet Take 1 tablet by mouth 2 (two) times daily as needed. prn   hydrocortisone 2.5 % cream APPLY TO AFFECTED AREA TWICE A DAY (Patient taking differently: Apply 1 Application topically daily as needed (itching).)   isosorbide mononitrate (IMDUR) 60 MG 24 hr tablet TAKE 1 TABLET (60 MG TOTAL) DAILY BY MOUTH.   levothyroxine (SYNTHROID) 88 MCG tablet Take 1 tablet (88 mcg total) by mouth daily before breakfast.   loratadine (CLARITIN) 10 MG tablet TAKE 1 TABLET BY MOUTH EVERY DAY   methocarbamol (ROBAXIN) 500 MG tablet TAKE 1 TABLET (500 MG TOTAL) BY MOUTH EVERY 12 (TWELVE) HOURS AS NEEDED FOR MUSCLE SPASMS.   metoprolol tartrate (LOPRESSOR) 100 MG tablet Take 1 tablet (100 mg total) by mouth 2 (two) times daily.   nitroGLYCERIN (NITROSTAT) 0.4 MG SL tablet PLACE 1 TABLET UNDER THE TONGUE EVERY 5 MINUTES AS NEEDED FOR CHEST PAIN.   polyethylene glycol (MIRALAX / GLYCOLAX) 17 g packet Take 17 g by mouth daily as needed for mild constipation.   potassium chloride (KLOR-CON) 10 MEQ tablet Take 1 tablet (10 mEq total) by mouth daily.  rosuvastatin (CRESTOR) 10 MG tablet Take 10 mg by mouth daily.   rosuvastatin (CRESTOR) 20 MG tablet Take 1 tablet (20 mg total) by mouth daily.   senna (SENOKOT) 8.6 MG TABS tablet Take 1 tablet (8.6 mg total) by mouth 2 (two) times daily.   Zoledronic Acid (RECLAST IV) Inject into the vein. Once yearly   No facility-administered encounter medications on file as of 06/21/2022.    Allergies (verified) Sulfa antibiotics   History: Past Medical History:  Diagnosis Date   Aneurysm ([place])    Aortic insufficiency    Atrial fibrillation ([facility])    CAD (coronary artery disease)    CABG LIMA to  LAD,vein graft to diagonal,vein graft to OM   Dyslipidemia    Essential hypertension 09/16/2013   GERD (gastroesophageal reflux disease)    Hypothyroid    Past Surgical History:  Procedure Laterality Date   ABDOMINAL HYSTERECTOMY     BACK SURGERY  2009   CORONARY ANGIOPLASTY  2003   cutting balloon atherectomy in-stent stenosis of the LAD.   CORONARY ANGIOPLASTY WITH STENT PLACEMENT  2002   remote PCI & stenting LAD,   CORONARY ARTERY BYPASS GRAFT  2004   LIMA to LAD,vein graft to diagonal,vein graft to OM   FOOT SURGERY     KYPHOPLASTY N/A 11/24/2021   Procedure: LUMBAR ONE KYPHOPLASTY;  Surgeon: Kristeen Miss, MD;  Location: [facility];  Service: Neurosurgery;  Laterality: N/A;  LUMBAR ONE KYPHOPLASTY   LEFT HEART CATH AND CORS/GRAFTS ANGIOGRAPHY N/A 02/23/2017   Procedure: LEFT HEART CATH AND CORS/GRAFTS ANGIOGRAPHY;  Surgeon: Wellington Hampshire, MD;  Location: [facility];  Service: Cardiovascular;  Laterality: N/A;   LEFT HEART CATHETERIZATION WITH CORONARY ANGIOGRAM N/A 12/15/2011   Procedure: LEFT HEART CATHETERIZATION WITH CORONARY ANGIOGRAM;  Surgeon: Sanda Klein, MD;  Location: [place] [facility];  Service: Cardiovascular;  Laterality: N/A;   LOOP RECORDER INSERTION N/A 06/14/2018   Procedure: LOOP RECORDER INSERTION;  Surgeon: Sanda Klein, MD;  Location: [facility];  Service: Cardiovascular;  Laterality: N/A;   NECK SURGERY     NM MYOCAR PERF WALL MOTION  12/01/2011   normal study-persistent exercise induced ECG changes raises the concern for ischemia (balanced ischemia).   NOSE SURGERY     PACEMAKER INSERTION     TONSILLECTOMY  1948   TUBAL LIGATION     Family History  Problem Relation Age of Onset   Heart attack Mother    Heart failure Father    Hypertension Father    Heart disease Father    Asthma Maternal Grandmother    Heart disease Sister    Stroke Brother    Voice disorder Daughter        vocal dystonia   Stroke Daughter    Stroke Son    Cancer  Sister 77   Social History   Socioeconomic History   Marital status: Widowed    Spouse name: Not on file   Number of children: 2   Years of education: Associates   Highest education level: Associate degree: academic program  Occupational History   Occupation: Retired  Tobacco Use   Smoking status: Never   Smokeless tobacco: Never  Scientific laboratory technician Use: Never used  Substance and Sexual Activity   Alcohol use: No   Drug use: No   Sexual activity: Not Currently  Other Topics Concern   Not on file  Social History Narrative   Mother of 2 with 4 grandchildren  Right-handed   Caffeine: 8-10 cups coffee   Social Determinants of Health   Financial Resource Strain: Low Risk  (06/21/2022)   Overall Financial Resource Strain (CARDIA)    Difficulty of Paying Living Expenses: Not hard at all  Food Insecurity: No Food Insecurity (06/21/2022)   Hunger Vital Sign    Worried About Running Out of Food in the Last Year: Never true    Ran Out of Food in the Last Year: Never true  Transportation Needs: No Transportation Needs (06/21/2022)   PRAPARE - Administrator, Civil Service (Medical): No    Lack of Transportation (Non-Medical): No  Physical Activity: Insufficiently Active (06/21/2022)   Exercise Vital Sign    Days of Exercise per Week: 3 days    Minutes of Exercise per Session: 30 min  Stress: No Stress Concern Present (06/21/2022)   [employer] of Occupational Health - Occupational Stress Questionnaire    Feeling of Stress : Not at all  Social Connections: Moderately Integrated (06/21/2022)   Social Connection and Isolation Panel [NHANES]    Frequency of Communication with Friends and Family: More than three times a week    Frequency of Social Gatherings with Friends and Family: More than three times a week    Attends Religious Services: 1 to 4 times per year    Active Member of [employer] or Organizations: Yes    Attends Banker Meetings: More than 4 times per  year    Marital Status: Widowed    Tobacco Counseling Counseling given: Not Answered   Clinical Intake:  Pre-visit preparation completed: Yes  Pain : No/denies pain     Nutritional Risks: None Diabetes: No  How often do you need to have someone help you when you read instructions, pamphlets, or other written materials from your doctor or pharmacy?: 1 - Never  Diabetic?no   Interpreter Needed?: No  Information entered by :: Renie Ora, LPN   Activities of Daily Living    06/21/2022   10:35 AM 11/20/2021    1:28 PM  In your present state of health, do you have any difficulty performing the following activities:  Hearing? 0 0  Vision? 0 0  Difficulty concentrating or making decisions? 0 0  Walking or climbing stairs? 0 0  Dressing or bathing? 0 1  Doing errands, shopping? 0 0  Preparing Food and eating ? N   Using the Toilet? N   In the past six months, have you accidently leaked urine? N   Do you have problems with loss of bowel control? N   Managing your Medications? N   Managing your Finances? N   Housekeeping or managing your Housekeeping? N     Patient Care Team: Bennie Pierini, FNP as PCP - General (Family Medicine) Croitoru, Rachelle Hora, MD as PCP - Cardiology (Cardiology) Regan Lemming, MD as PCP - Electrophysiology (Cardiology) Roma Kayser, MD as Consulting Physician (Endocrinology) Cherlyn Roberts, MD as Referring Physician (Dermatology) Dermatology, [facility] any recent Medical Services you may have received from other than [facility] providers in the past year (date may be approximate).     Assessment:   This is a routine wellness examination for Nunda.  Hearing/Vision screen Vision Screening - Comments:: Wears rx glasses - up to date with routine eye exams with  Dr.Shipiro   Dietary issues and exercise activities discussed: Current Exercise Habits: Home exercise routine, Type of exercise: walking, Time (Minutes):  30, Frequency (Times/Week): 3, Weekly Exercise (Minutes/Week): 90,  Exercise limited by: orthopedic condition(s)   Goals Addressed               This Visit's Progress     DIET - EAT MORE FRUITS AND VEGETABLES (pt-stated)   On track     Cut out snacking on salty foods and try more healthy options.       Depression Screen    06/21/2022   10:34 AM 05/11/2022   11:34 AM 04/05/2022   12:06 PM 03/09/2022   12:26 PM 01/01/2022   12:09 PM 12/10/2021    8:50 AM 09/29/2021   12:06 PM  PHQ 2/9 Scores  PHQ - 2 Score 0 0 0 0 0 0 0  PHQ- 9 Score 0 0 0 0 3 3 0    Fall Risk    06/21/2022   10:32 AM 05/11/2022   11:34 AM 04/05/2022   12:06 PM 03/09/2022   12:26 PM 01/01/2022   12:09 PM  Fall Risk   Falls in the past year? 0 0 0 0 0  Number falls in past yr: 0      Injury with Fall? 0      Risk for fall due to : No Fall Risks      Follow up Falls prevention discussed        FALL RISK PREVENTION PERTAINING TO THE HOME:  Any stairs in or around the home? No  If so, are there any without handrails? No  Home free of loose throw rugs in walkways, pet beds, electrical cords, etc? Yes  Adequate lighting in your home to reduce risk of falls? Yes   ASSISTIVE DEVICES UTILIZED TO PREVENT FALLS:  Life alert? No  Use of a cane, walker or w/c? No  Grab bars in the bathroom? Yes  Shower chair or bench in shower? Yes  Elevated toilet seat or a handicapped toilet? No       01/11/2018   10:55 AM  MMSE - Mini Mental State Exam  Orientation to time 5  Orientation to Place 5  Registration 3  Attention/ Calculation 5  Recall 2  Language- name 2 objects 2  Language- repeat 1  Language- follow 3 step command 3  Language- read & follow direction 1  Write a sentence 1  Copy design 1  Total score 29        06/21/2022   10:36 AM 02/26/2020   10:39 AM 01/18/2019    9:56 AM  6CIT Screen  What Year? 0 points 0 points 0 points  What month? 0 points 0 points 0 points  What time? 0 points 0  points 0 points  Count back from 20 0 points 0 points 0 points  Months in reverse 0 points 0 points 0 points  Repeat phrase 0 points 0 points   Total Score 0 points 0 points     Immunizations Immunization History  Administered Date(s) Administered   Fluad Quad(high Dose 65+) 02/29/2020, 03/27/2021, 05/11/2022   Influenza, High Dose Seasonal PF 03/09/2016, 03/02/2017   Moderna Sars-Covid-2 Vaccination 01/02/2020, 01/30/2020   Pneumococcal Conjugate-13 01/11/2018    TDAP status: Due, Education has been provided regarding the importance of this vaccine. Advised may receive this vaccine at local pharmacy or Health Dept. Aware to provide a copy of the vaccination record if obtained from local pharmacy or Health Dept. Verbalized acceptance and understanding.  Flu Vaccine status: Up to date  Pneumococcal vaccine status: Due, Education has been provided regarding the importance of this vaccine. Advised  may receive this vaccine at local pharmacy or Health Dept. Aware to provide a copy of the vaccination record if obtained from local pharmacy or Health Dept. Verbalized acceptance and understanding.  Covid-19 vaccine status: Completed vaccines  Qualifies for Shingles Vaccine? Yes   Zostavax completed No   Shingrix Completed?: No.    Education has been provided regarding the importance of this vaccine. Patient has been advised to call insurance company to determine out of pocket expense if they have not yet received this vaccine. Advised may also receive vaccine at local pharmacy or Health Dept. Verbalized acceptance and understanding.  Screening Tests Health Maintenance  Topic Date Due   DTaP/Tdap/Td (1 - Tdap) Never done   COVID-19 Vaccine (3 - 2023-24 season) 01/15/2022   Zoster Vaccines- Shingrix (1 of 2) 07/06/2022 (Originally 07/25/1990)   DEXA SCAN  01/02/2023 (Originally 01/06/2018)   Pneumonia Vaccine 97+ Years old (2 - PPSV23 or PCV20) 04/06/2023 (Originally 01/12/2019)   Medicare  Annual Wellness (AWV)  06/22/2023   INFLUENZA VACCINE  Completed   HPV VACCINES  Aged Out    Health Maintenance  Health Maintenance Due  Topic Date Due   DTaP/Tdap/Td (1 - Tdap) Never done   COVID-19 Vaccine (3 - 2023-24 season) 01/15/2022    Colorectal cancer screening: No longer required.   Mammogram status: No longer required due to age.  Bone Density status: Ordered 06/21/2022. Pt provided with contact info and advised to call to schedule appt.  Lung Cancer Screening: (Low Dose CT Chest recommended if Age 9-80 years, 30 pack-year currently smoking OR have quit w/in 15years.) does not qualify.   Lung Cancer Screening Referral: n/a  Additional Screening:  Hepatitis C Screening: does not qualify;   Vision Screening: Recommended annual ophthalmology exams for early detection of glaucoma and other disorders of the eye. Is the patient up to date with their annual eye exam?  Yes  Who is the provider or what is the name of the office in which the patient attends annual eye exams? Dr.Shipiro If pt is not established with a provider, would they like to be referred to a provider to establish care? No .   Dental Screening: Recommended annual dental exams for proper oral hygiene  Community Resource Referral / Chronic Care Management: CRR required this visit?  No   CCM required this visit?  No      Plan:     I have personally reviewed and noted the following in the patient's chart:   Medical and social history Use of alcohol, tobacco or illicit drugs  Current medications and supplements including opioid prescriptions. Patient is not currently taking opioid prescriptions. Functional ability and status Nutritional status Physical activity Advanced directives List of other physicians Hospitalizations, surgeries, and ER visits in previous 12 months Vitals Screenings to include cognitive, depression, and falls Referrals and appointments  In addition, I have reviewed and  discussed with patient certain preventive protocols, quality metrics, and best practice recommendations. A written personalized care plan for preventive services as well as general preventive health recommendations were provided to patient.     Daphane Shepherd, LPN   11/16/2200   Nurse Notes: Due TDAP Vaccine

## 2022-06-23 ENCOUNTER — Other Ambulatory Visit: Payer: Self-pay

## 2022-06-23 DIAGNOSIS — Z780 Asymptomatic menopausal state: Secondary | ICD-10-CM

## 2022-07-29 ENCOUNTER — Telehealth: Payer: Self-pay | Admitting: Nurse Practitioner

## 2022-07-29 NOTE — Telephone Encounter (Signed)
Contacted [name] to schedule their annual wellness visit. Appointment made for 06/23/2023.  Thank you,  Colletta Maryland,  [place] [facility] Direct Dial ??[phone]

## 2022-08-04 ENCOUNTER — Telehealth: Payer: Self-pay | Admitting: Nurse Practitioner

## 2022-08-04 NOTE — Telephone Encounter (Signed)
Contacted [name] to schedule their annual wellness visit. Appointment made for 06/22/2022.   Thank you,  Colletta Maryland,  [place] [facility] Direct Dial ??[phone]

## 2022-08-22 ENCOUNTER — Other Ambulatory Visit: Payer: Self-pay

## 2022-08-22 ENCOUNTER — Emergency Department (HOSPITAL_COMMUNITY): Payer: Medicare HMO

## 2022-08-22 ENCOUNTER — Encounter (HOSPITAL_COMMUNITY): Payer: Self-pay | Admitting: Emergency Medicine

## 2022-08-22 ENCOUNTER — Emergency Department (HOSPITAL_COMMUNITY)
Admission: EM | Admit: 2022-08-22 | Discharge: 2022-08-22 | Disposition: A | Discharge status: Home / Self Care | Payer: Medicare HMO | Attending: Emergency Medicine | Admitting: Emergency Medicine

## 2022-08-22 DIAGNOSIS — I2510 Atherosclerotic heart disease of native coronary artery without angina pectoris: Secondary | ICD-10-CM | POA: Insufficient documentation

## 2022-08-22 DIAGNOSIS — R410 Disorientation, unspecified: Secondary | ICD-10-CM | POA: Diagnosis not present

## 2022-08-22 DIAGNOSIS — R4701 Aphasia: Secondary | ICD-10-CM | POA: Diagnosis not present

## 2022-08-22 DIAGNOSIS — G459 Transient cerebral ischemic attack, unspecified: Secondary | ICD-10-CM | POA: Insufficient documentation

## 2022-08-22 DIAGNOSIS — Z951 Presence of aortocoronary bypass graft: Secondary | ICD-10-CM | POA: Diagnosis not present

## 2022-08-22 DIAGNOSIS — Z79899 Other long term (current) drug therapy: Secondary | ICD-10-CM | POA: Insufficient documentation

## 2022-08-22 DIAGNOSIS — I10 Essential (primary) hypertension: Secondary | ICD-10-CM | POA: Insufficient documentation

## 2022-08-22 DIAGNOSIS — Z7901 Long term (current) use of anticoagulants: Secondary | ICD-10-CM | POA: Diagnosis not present

## 2022-08-22 DIAGNOSIS — R791 Abnormal coagulation profile: Secondary | ICD-10-CM | POA: Insufficient documentation

## 2022-08-22 DIAGNOSIS — I480 Paroxysmal atrial fibrillation: Secondary | ICD-10-CM | POA: Insufficient documentation

## 2022-08-22 DIAGNOSIS — R479 Unspecified speech disturbances: Secondary | ICD-10-CM

## 2022-08-22 LAB — BASIC METABOLIC PANEL
Anion gap: 10 (ref 5–15)
BUN: 15 mg/dL (ref 8–23)
CO2: 23 mmol/L (ref 22–32)
Calcium: 9.1 mg/dL (ref 8.9–10.3)
Chloride: 105 mmol/L (ref 98–111)
Creatinine, Ser: 0.61 mg/dL (ref 0.44–1.00)
GFR, Estimated: >60 mL/min (ref >60)
Glucose, Bld: 98 mg/dL (ref 70–99)
Potassium: 3.7 mmol/L (ref 3.5–5.1)
Sodium: 138 mmol/L (ref 135–145)

## 2022-08-22 LAB — CBC WITH DIFFERENTIAL/PLATELET
Abs Immature Granulocytes: 0.05 10*3/uL (ref 0.00–0.07)
Basophils Absolute: 0 10*3/uL (ref 0.0–0.1)
Basophils Relative: 0 %
Eosinophils Absolute: 0 10*3/uL (ref 0.0–0.5)
Eosinophils Relative: 0 %
HCT: 39.6 % (ref 36.0–46.0)
Hemoglobin: 12.9 g/dL (ref 12.0–15.0)
Immature Granulocytes: 0 %
Lymphocytes Relative: 22 %
Lymphs Abs: 2.6 10*3/uL (ref 0.7–4.0)
MCH: 30.7 pg (ref 26.0–34.0)
MCHC: 32.6 g/dL (ref 30.0–36.0)
MCV: 94.3 fL (ref 80.0–100.0)
Monocytes Absolute: 0.7 10*3/uL (ref 0.1–1.0)
Monocytes Relative: 6 %
Neutro Abs: 8.5 10*3/uL — ABNORMAL HIGH (ref 1.7–7.7)
Neutrophils Relative %: 72 %
Platelets: 206 10*3/uL (ref 150–400)
RBC: 4.2 MIL/uL (ref 3.87–5.11)
RDW: 13.8 % (ref 11.5–15.5)
WBC: 11.8 10*3/uL — ABNORMAL HIGH (ref 4.0–10.5)
nRBC: 0 % (ref 0.0–0.2)

## 2022-08-22 LAB — URINALYSIS, ROUTINE W REFLEX MICROSCOPIC
Bilirubin Urine: NEGATIVE
Glucose, UA: NEGATIVE mg/dL
Ketones, ur: 5 mg/dL — AB
Nitrite: NEGATIVE
Protein, ur: NEGATIVE mg/dL
Specific Gravity, Urine: 1.014 (ref 1.005–1.030)
pH: 5 (ref 5.0–8.0)

## 2022-08-22 LAB — RAPID URINE DRUG SCREEN, HOSP PERFORMED
Amphetamines: NOT DETECTED
Barbiturates: NOT DETECTED
Benzodiazepines: NOT DETECTED
Cocaine: NOT DETECTED
Opiates: POSITIVE — AB
Tetrahydrocannabinol: NOT DETECTED

## 2022-08-22 LAB — PROTIME-INR
INR: 1.2 (ref 0.8–1.2)
Prothrombin Time: 14.7 seconds (ref 11.4–15.2)

## 2022-08-22 LAB — CBG MONITORING, ED: Glucose-Capillary: 71 mg/dL (ref 70–99)

## 2022-08-22 LAB — MAGNESIUM: Magnesium: 1.8 mg/dL (ref 1.7–2.4)

## 2022-08-22 LAB — APTT: aPTT: 38 seconds — ABNORMAL HIGH (ref 24–36)

## 2022-08-22 LAB — ETHANOL: Alcohol, Ethyl (B): <10 mg/dL (ref <10)

## 2022-08-22 MED ORDER — IOHEXOL 350 MG/ML SOLN
75.0000 mL | Freq: Once | INTRAVENOUS | Status: AC | PRN
  Administered 2022-08-22: 75 mL via INTRAVENOUS

## 2022-08-22 NOTE — ED Provider Notes (Signed)
[place] EMERGENCY DEPARTMENT AT [facility] Provider Note   CSN: [id number] Arrival date & time: 08/22/22  1357     History {Add pertinent medical, surgical, social history, OB history to HPI:1} Chief Complaint  Patient presents with  . Transient Ischemic Attack    Wilfred LacyShirley H Monforte is a 82 y.o. female with HTN, CAD status post CABG x 3, hypothyroidism, paroxysmal A-fib on Eliquis, hemispheric carotid artery syndrome, history of TIA, ascending aortic aneurysm, aortic insufficiency who presents with TIA.   Additional history provided by daughter at bedside.  At ~12:30 PM today, patient had acute onset aphasia, confusion, blurry vision, "inability to think or speak" that lasted approx 3 hours.  Witnessed by daughter.  Has history of TIAs, most recent one was a year ago but didn't present to ED for it. One before that was 1.5 years ago and did spend a night in the hospital. Denies numbness/tingling, asymmetric weakness, SOB, abd pain, N/V/D/C, urinary symptoms, falls, headache. Takes eliquis, hasn't missed any doses, last dose was this afternoon.  Denies any pain at the moment and currently feels back at her baseline.  HPI     Home Medications Prior to Admission medications   Medication Sig Start Date End Date Taking? Authorizing Provider  apixaban (ELIQUIS) 2.5 MG TABS tablet Take 1 tablet (2.5 mg total) by mouth 2 (two) times daily. 04/05/22   Daphine DeutscherMartin, Mary-Margaret, FNP  Cholecalciferol (VITAMIN D3) 125 MCG (5000 UT) CAPS Take 1 capsule (5,000 Units total) by mouth daily. 02/24/22   Roma KayserNida, Gebreselassie W, MD  diltiazem (CARDIZEM) 30 MG tablet Take 1 tablet (30 mg total) by mouth every 6 (six) hours as needed (for rapid heart rate). 04/05/22   Daphine DeutscherMartin, Mary-Margaret, FNP  furosemide (LASIX) 20 MG tablet Take 1 tablet (20 mg total) by mouth daily. 04/05/22   Daphine DeutscherMartin, Mary-Margaret, FNP  hydrocortisone 2.5 % cream APPLY TO AFFECTED AREA TWICE A DAY Patient taking differently:  Apply 1 Application topically daily as needed (itching). 11/13/20   Daphine DeutscherMartin, Mary-Margaret, FNP  isosorbide mononitrate (IMDUR) 60 MG 24 hr tablet TAKE 1 TABLET (60 MG TOTAL) DAILY BY MOUTH. 04/05/22   Daphine DeutscherMartin, Mary-Margaret, FNP  levothyroxine (SYNTHROID) 88 MCG tablet Take 1 tablet (88 mcg total) by mouth daily before breakfast. 04/05/22   Daphine DeutscherMartin, Mary-Margaret, FNP  loratadine (CLARITIN) 10 MG tablet TAKE 1 TABLET BY MOUTH EVERY DAY 11/13/20   Daphine DeutscherMartin, Mary-Margaret, FNP  methocarbamol (ROBAXIN) 500 MG tablet TAKE 1 TABLET (500 MG TOTAL) BY MOUTH EVERY 12 (TWELVE) HOURS AS NEEDED FOR MUSCLE SPASMS. 11/03/21   Daphine DeutscherMartin, Mary-Margaret, FNP  metoprolol tartrate (LOPRESSOR) 100 MG tablet Take 1 tablet (100 mg total) by mouth 2 (two) times daily. 04/05/22   Daphine DeutscherMartin, Mary-Margaret, FNP  nitroGLYCERIN (NITROSTAT) 0.4 MG SL tablet PLACE 1 TABLET UNDER THE TONGUE EVERY 5 MINUTES AS NEEDED FOR CHEST PAIN. 06/12/21   Camnitz, Andree CossWill Hueber, MD  polyethylene glycol (MIRALAX / GLYCOLAX) 17 g packet Take 17 g by mouth daily as needed for mild constipation. 11/25/21   Kathlen ModyAkula, Vijaya, MD  potassium chloride (KLOR-CON) 10 MEQ tablet Take 1 tablet (10 mEq total) by mouth daily. 04/05/22 03/31/23  Daphine DeutscherMartin, Mary-Margaret, FNP  rosuvastatin (CRESTOR) 10 MG tablet Take 10 mg by mouth daily.    [provider]  rosuvastatin (CRESTOR) 20 MG tablet Take 1 tablet (20 mg total) by mouth daily. 04/05/22   Daphine DeutscherMartin, Mary-Margaret, FNP  senna (SENOKOT) 8.6 MG TABS tablet Take 1 tablet (8.6 mg total) by mouth 2 (two)  times daily. 11/25/21   Kathlen Mody, MD  Zoledronic Acid (RECLAST IV) Inject into the vein. Once yearly    [provider]      Allergies    Sulfa antibiotics    Review of Systems   Review of Systems Review of systems Negative for f/c, headache.  A 10 point review of systems was performed and is negative unless otherwise reported in HPI.  Physical Exam Updated Vital Signs BP (!) 140/69   Pulse (!) 55    Temp 98 F (36.7 C) (Oral)   Resp 17   Ht 5\' 2"  (1.575 m)   Wt 49.9 kg   SpO2 91%   BMI 20.12 kg/m  Physical Exam General: Normal appearing female, lying in bed.  HEENT: PERRLA, EOMI, Sclera anicteric, MMM, trachea midline.  Cardiology: RRR, no murmurs/rubs/gallops. BL radial and DP pulses equal bilaterally.  Resp: Normal respiratory rate and effort. CTAB, no wheezes, rhonchi, crackles.  Abd: Soft, non-tender, non-distended. No rebound tenderness or guarding.  GU: Deferred. MSK: No peripheral edema or signs of trauma. Extremities without deformity or TTP. No cyanosis or clubbing. Skin: warm, dry Neuro: A&Ox4, CNs II-XII grossly intact. 5/5 strength in all extremities. Sensation grossly intact. Normal speech. Tongue protrudes midline. Normal FTN BL.  Normal repetition.  No dysarthria. Psych: Normal mood and affect.   1a  Level of consciousness: 0=alert; keenly responsive  1b. LOC questions:  0=Performs both tasks correctly  1c. LOC commands: 0=Performs both tasks correctly  2.  Best Gaze: 0=normal  3.  Visual: 0=No visual loss  4. Facial Palsy: 0=Normal symmetric movement  5a.  Motor left arm: 0=No drift, limb holds 90 (or 45) degrees for full 10 seconds  5b.  Motor right arm: 0=No drift, limb holds 90 (or 45) degrees for full 10 seconds  6a. motor left leg: 0=No drift, limb holds 90 (or 45) degrees for full 10 seconds  6b  Motor right leg:  0=No drift, limb holds 90 (or 45) degrees for full 10 seconds  7. Limb Ataxia: 0=Absent  8.  Sensory: 0=Normal; no sensory loss  9. Best Language:  0=No aphasia, normal  10. Dysarthria: 0=Normal  11. Extinction and Inattention: 0=No abnormality   Total:   0        ED Results / Procedures / Treatments   Labs (all labs ordered are listed, but only abnormal results are displayed) Labs Reviewed  CBC WITH DIFFERENTIAL/PLATELET  BASIC METABOLIC PANEL  MAGNESIUM  URINALYSIS, ROUTINE W REFLEX MICROSCOPIC  RAPID URINE DRUG SCREEN, HOSP  PERFORMED  APTT  PROTIME-INR  ETHANOL  CBG MONITORING, ED    EKG None  Radiology No results found.  Procedures Procedures  {Document cardiac monitor, telemetry assessment procedure when appropriate:1}  Medications Ordered in ED Medications - No data to display  ED Course/ Medical Decision Making/ A&P                          Medical Decision Making Amount and/or Complexity of Data Reviewed Labs: ordered. Radiology: ordered.    This patient presents to the ED for concern of possible TIA, this involves an extensive number of treatment options, and is a complaint that carries with it a high risk of complications and morbidity.  I considered the following differential and admission for this acute, potentially life threatening condition.   MDM:    Given the acute onset of neurological symptoms, TIA is the most concerning etiology of these acute  symptoms.  Patient has had several TIAs in the past and this is consistent with her prior episodes.  However this time she did not have any numbness tingling or weakness that she has had in the past.  Patient is currently NIH 0 and back at her baseline.  Daughter at bedside can confirm.  Also consider other causes of this episode such as electrolyte derangements, hypo-/hyperglycemia, seizure though no loss of consciousness or tonic-clonic movement was reported, alcohol or drug intake the patient denies. Will obtain CTH to r/o ICH and labs. Likely will d/w neurology.  Clinical Course as of 08/22/22 1704  Sun Aug 22, 2022  1614 Glucose-Capillary: 26 [HN]  1615 Opiates(!): POSITIVE Has norco prescribed for cervical DDD [HN]  1658 CT Head Wo Contrast No acute intracranial abnormality. Mild cerebral atrophy. [HN]  1658 WBC(!): 11.8 [HN]  1658 Hemoglobin: 12.9 [HN]    Clinical Course User Index [HN] Loetta Rough, MD     Labs: I Ordered, and personally interpreted labs.  The pertinent results include:  those listed above  Imaging  Studies ordered: I ordered imaging studies including CTH I independently visualized and interpreted imaging. I agree with the radiologist interpretation  Additional history obtained from daughter, chart review.   Cardiac Monitoring: .The patient was maintained on a cardiac monitor.  I personally viewed and interpreted the cardiac monitored which showed an underlying rhythm of: NSR  Reevaluation: After the interventions noted above, I reevaluated the patient and found that they have :{resolved/improved/worsened:23923::"improved"}  Social Determinants of Health: .***  Disposition:  ***  Co morbidities that complicate the patient evaluation . Past Medical History:  Diagnosis Date  . Aneurysm (HCC)   . Aortic insufficiency   . Atrial fibrillation (HCC)   . CAD (coronary artery disease)    CABG LIMA to LAD,vein graft to diagonal,vein graft to OM  . Dyslipidemia   . Essential hypertension 09/16/2013  . GERD (gastroesophageal reflux disease)   . Hypothyroid      Medicines No orders of the defined types were placed in this encounter.   I have reviewed the patients home medicines and have made adjustments as needed  Problem List / ED Course: Problem List Items Addressed This Visit   None        {Document critical care time when appropriate:1} {Document review of labs and clinical decision tools ie heart score, Chads2Vasc2 etc:1}  {Document your independent review of radiology images, and any outside records:1} {Document your discussion with family members, caretakers, and with consultants:1} {Document social determinants of health affecting pt's care:1} {Document your decision making why or why not admission, treatments were needed:1}  This note was created using dictation software, which may contain spelling or grammatical errors.

## 2022-08-22 NOTE — Discharge Instructions (Addendum)
Thank you for coming to [facility] Emergency Department. You were seen for confusion and trouble speaking that resolved.. We did an exam, labs, and imaging, and these showed no acute findings. You have likely had a TIA. We discussed with neurology who stated that you can follow up outpatient. Please call your neurologist in the morning to make an appointment for within the next 2 weeks.  Please follow up with your primary care provider within 1 week.   Do not hesitate to return to the ED or call 911 if you experience: -Worsening symptoms -Confusion -Trouble speaking -Trouble swallowing -Weakness on one side or another -Numbness/tingling -Falls -Lightheadedness, passing out -Fevers/chills -Anything else that concerns you

## 2022-08-22 NOTE — ED Notes (Signed)
Patient transported to CT 

## 2022-08-22 NOTE — ED Triage Notes (Signed)
Patient arrives from home for reported aphagia, visual disturbances and memory issues, that began at 12:15 and were witnessed by family.  On arrival symptoms had resolved. A&O x 4.

## 2022-08-22 NOTE — Progress Notes (Signed)
ON CALL PHONE CONSULT  Call from Dr Jearld Fenton @ [facility] ED   Discussion - 82/F with PMH of TIA, on eliquis, 2h h/o speech deficits now resolved. CTH with NAD. Recommend CTA head and neck and if normal, no changes to management. Can follow outpatient.   -- Milon Dikes, MD Neurologist [facility] Pager: [phone] No charge note

## 2022-08-23 ENCOUNTER — Encounter: Payer: Self-pay | Admitting: Nurse Practitioner

## 2022-08-23 ENCOUNTER — Ambulatory Visit: Payer: Medicare HMO | Admitting: Nurse Practitioner

## 2022-08-23 ENCOUNTER — Telehealth: Payer: Self-pay | Admitting: Nurse Practitioner

## 2022-08-23 VITALS — BP 107/63 | HR 63 | Temp 98.1°F | Resp 20 | Ht 62.0 in | Wt 112.0 lb

## 2022-08-23 DIAGNOSIS — E782 Mixed hyperlipidemia: Secondary | ICD-10-CM

## 2022-08-23 DIAGNOSIS — G451 Carotid artery syndrome (hemispheric): Secondary | ICD-10-CM

## 2022-08-23 DIAGNOSIS — I480 Paroxysmal atrial fibrillation: Secondary | ICD-10-CM

## 2022-08-23 DIAGNOSIS — R52 Pain, unspecified: Secondary | ICD-10-CM

## 2022-08-23 DIAGNOSIS — I10 Essential (primary) hypertension: Secondary | ICD-10-CM

## 2022-08-23 DIAGNOSIS — I7121 Aneurysm of the ascending aorta, without rupture: Secondary | ICD-10-CM

## 2022-08-23 DIAGNOSIS — I25718 Atherosclerosis of autologous vein coronary artery bypass graft(s) with other forms of angina pectoris: Secondary | ICD-10-CM | POA: Diagnosis not present

## 2022-08-23 DIAGNOSIS — E876 Hypokalemia: Secondary | ICD-10-CM

## 2022-08-23 DIAGNOSIS — M5030 Other cervical disc degeneration, unspecified cervical region: Secondary | ICD-10-CM

## 2022-08-23 DIAGNOSIS — E039 Hypothyroidism, unspecified: Secondary | ICD-10-CM

## 2022-08-23 DIAGNOSIS — F419 Anxiety disorder, unspecified: Secondary | ICD-10-CM

## 2022-08-23 MED ORDER — POTASSIUM CHLORIDE ER 10 MEQ PO TBCR
10.0000 meq | EXTENDED_RELEASE_TABLET | Freq: Every day | ORAL | 3 refills | Status: DC
Start: 2022-08-23 — End: 2023-06-30

## 2022-08-23 MED ORDER — ISOSORBIDE MONONITRATE ER 60 MG PO TB24
ORAL_TABLET | ORAL | 1 refills | Status: DC
Start: 2022-08-23 — End: 2023-02-18

## 2022-08-23 MED ORDER — FUROSEMIDE 20 MG PO TABS: 20.0000 mg | ORAL_TABLET | Freq: Every day | ORAL | 1 refills | Status: DC

## 2022-08-23 MED ORDER — HYDROCODONE-ACETAMINOPHEN 5-325 MG PO TABS
1.0000 | ORAL_TABLET | Freq: Four times a day (QID) | ORAL | 0 refills | Status: AC | PRN
Start: 2022-10-22 — End: 2022-11-21

## 2022-08-23 MED ORDER — HYDROCODONE-ACETAMINOPHEN 5-325 MG PO TABS
1.0000 | ORAL_TABLET | Freq: Four times a day (QID) | ORAL | 0 refills | Status: AC | PRN
Start: 2022-09-22 — End: 2022-10-22

## 2022-08-23 MED ORDER — DILTIAZEM HCL 30 MG PO TABS
30.0000 mg | ORAL_TABLET | Freq: Four times a day (QID) | ORAL | 3 refills | Status: DC | PRN
Start: 2022-08-23 — End: 2023-02-18

## 2022-08-23 MED ORDER — APIXABAN 2.5 MG PO TABS: 2.5000 mg | ORAL_TABLET | Freq: Two times a day (BID) | ORAL | 1 refills | Status: DC

## 2022-08-23 MED ORDER — ROSUVASTATIN CALCIUM 20 MG PO TABS: 20.0000 mg | ORAL_TABLET | Freq: Every day | ORAL | 1 refills | Status: DC

## 2022-08-23 MED ORDER — METOPROLOL TARTRATE 100 MG PO TABS
100.0000 mg | ORAL_TABLET | Freq: Two times a day (BID) | ORAL | 1 refills | Status: DC
Start: 2022-08-23 — End: 2022-11-25

## 2022-08-23 MED ORDER — HYDROCODONE-ACETAMINOPHEN 5-325 MG PO TABS
1.0000 | ORAL_TABLET | Freq: Four times a day (QID) | ORAL | 0 refills | Status: AC | PRN
Start: 2022-08-23 — End: 2022-09-22

## 2022-08-23 NOTE — Progress Notes (Signed)
Subjective:    Patient ID: Evelyn [name], female    DOB: 09-04-1940, 82 y.o.   MRN: [id number]   Chief Complaint: medical management of chronic issues     HPI:  Evelyn [name] is a 82 y.o. who identifies as a female who was assigned female at birth.   Social history: Lives with: by herself- her son lives next door and she talks to her daughter daily. Work history: retired   Water engineer in today for follow up of the following chronic medical issues:  1. Essential hypertension No c/o chest pain, sob or headache. Does not check blood pressure at home. BP Readings from Last 3 Encounters:  08/22/22 (!) 148/56  05/27/22 108/66  05/11/22 119/66     2. Paroxysmal atrial fibrillation Is on eliquis with no bleeding issues. Denies palpitations or heart racing.  3. 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 Hemispheric carotid artery syndrome Last saw cardiology on 02/02/22. Review of office note showed no change in plan of care.  6. Acquired hypothyroidism No issues that hseis aware of. Lab Results  Component Value Date   TSH 1.100 05/24/2022     7. Mixed hyperlipidemia Does try to watch diet and stays fairly active  8. Anxiety Is currently on  no anxiey medication.     08/23/2022    8:12 AM 05/11/2022   11:35 AM 04/05/2022   12:06 PM 03/09/2022   12:27 PM  GAD 7 : Generalized Anxiety Score  Nervous, Anxious, on Edge 0 0 0 0  Control/stop worrying 0 0 0 0  Worry too much - different things 0 0 0 0  Trouble relaxing 0 0 0 0  Restless 0 0 0 0  Easily annoyed or irritable 0 0 0 0  Afraid - awful might happen 0 0 0 0  Total GAD 7 Score 0 0 0 0  Anxiety Difficulty Not difficult at all Not difficult at all Not difficult at all Not difficult at all      9. 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  Pain management Patient still has chronic neck and back pain. Rating of pain varies from day to day. She use to be on pain medication, but that was stopped when drug screen failed to show medication in her system. She was in so much pain last time that I agreed to start her back on it. Pain assessment: Cause of pain- DDD Pain location- neck and lower back Pain on scale of 1-10- 5-6/10 Frequency- daily What increases pain-nothing really What makes pain Better-rest helps Effects on ADL - none Any change in general medical condition-none  Current opioids rx- norco 5/325 BID # meds rx- 60 Effectiveness of current meds-helps Adverse reactions from pain meds-none Morphine equivalent- 15 MME  Pill count performed-No Last drug screen - 09/29/21 ( high risk q86m, moderate risk q22m, low risk yearly ) Urine drug screen today- Yes Was the NCCSR reviewed- yes  If yes were their any concerning findings? - no   Overdose risk: 1   Pain contract signed on:10/06/21     New complaints: Patient went to the ED yesterday with confusion. They dx her with TIA. Speech was affected for several hours. They discharged her home.  Allergies  Allergen Reactions   Sulfa Antibiotics Swelling   Outpatient Encounter Medications as  of 08/23/2022  Medication Sig   apixaban (ELIQUIS) 2.5 MG TABS tablet Take 1 tablet (2.5 mg total) by mouth 2 (two) times daily.   Cholecalciferol (VITAMIN D3) 125 MCG (5000 UT) CAPS Take 1 capsule (5,000 Units total) by mouth daily.   diltiazem (CARDIZEM) 30 MG tablet Take 1 tablet (30 mg total) by mouth every 6 (six) hours as needed (for rapid heart rate).   furosemide (LASIX) 20 MG tablet Take 1 tablet (20 mg total) by mouth daily.   hydrocortisone 2.5 % cream APPLY TO AFFECTED AREA TWICE A DAY (Patient taking differently: Apply 1 Application topically daily as needed (itching).)   isosorbide mononitrate (IMDUR) 60 MG 24 hr tablet TAKE 1 TABLET (60 MG TOTAL) DAILY BY MOUTH.    levothyroxine (SYNTHROID) 88 MCG tablet Take 1 tablet (88 mcg total) by mouth daily before breakfast.   loratadine (CLARITIN) 10 MG tablet TAKE 1 TABLET BY MOUTH EVERY DAY   methocarbamol (ROBAXIN) 500 MG tablet TAKE 1 TABLET (500 MG TOTAL) BY MOUTH EVERY 12 (TWELVE) HOURS AS NEEDED FOR MUSCLE SPASMS.   metoprolol tartrate (LOPRESSOR) 100 MG tablet Take 1 tablet (100 mg total) by mouth 2 (two) times daily.   nitroGLYCERIN (NITROSTAT) 0.4 MG SL tablet PLACE 1 TABLET UNDER THE TONGUE EVERY 5 MINUTES AS NEEDED FOR CHEST PAIN.   polyethylene glycol (MIRALAX / GLYCOLAX) 17 g packet Take 17 g by mouth daily as needed for mild constipation.   potassium chloride (KLOR-CON) 10 MEQ tablet Take 1 tablet (10 mEq total) by mouth daily.   rosuvastatin (CRESTOR) 10 MG tablet Take 10 mg by mouth daily.   rosuvastatin (CRESTOR) 20 MG tablet Take 1 tablet (20 mg total) by mouth daily.   senna (SENOKOT) 8.6 MG TABS tablet Take 1 tablet (8.6 mg total) by mouth 2 (two) times daily.   Zoledronic Acid (RECLAST IV) Inject into the vein. Once yearly   No facility-administered encounter medications on file as of 08/23/2022.    Past Surgical History:  Procedure Laterality Date   ABDOMINAL HYSTERECTOMY     BACK SURGERY  2009   CORONARY ANGIOPLASTY  2003   cutting balloon atherectomy in-stent stenosis of the LAD.   CORONARY ANGIOPLASTY WITH STENT PLACEMENT  2002   remote PCI & stenting LAD,   CORONARY ARTERY BYPASS GRAFT  2004   LIMA to LAD,vein graft to diagonal,vein graft to OM   FOOT SURGERY     KYPHOPLASTY N/A 11/24/2021   Procedure: LUMBAR ONE KYPHOPLASTY;  Surgeon: Barnett Abu, MD;  Location: [facility] OR;  Service: Neurosurgery;  Laterality: N/A;  LUMBAR ONE KYPHOPLASTY   LEFT HEART CATH AND CORS/GRAFTS ANGIOGRAPHY N/A 02/23/2017   Procedure: LEFT HEART CATH AND CORS/GRAFTS ANGIOGRAPHY;  Surgeon: Iran Ouch, MD;  Location: [facility] INVASIVE CV LAB;  Service: Cardiovascular;  Laterality: N/A;   LEFT HEART  CATHETERIZATION WITH CORONARY ANGIOGRAM N/A 12/15/2011   Procedure: LEFT HEART CATHETERIZATION WITH CORONARY ANGIOGRAM;  Surgeon: Thurmon Fair, MD;  Location: [facility] CATH LAB;  Service: Cardiovascular;  Laterality: N/A;   LOOP RECORDER INSERTION N/A 06/14/2018   Procedure: LOOP RECORDER INSERTION;  Surgeon: Thurmon Fair, MD;  Location: [facility] INVASIVE CV LAB;  Service: Cardiovascular;  Laterality: N/A;   NECK SURGERY     NM MYOCAR PERF WALL MOTION  12/01/2011   normal study-persistent exercise induced ECG changes raises the concern for ischemia (balanced ischemia).   NOSE SURGERY     PACEMAKER INSERTION     TONSILLECTOMY  1948   TUBAL LIGATION  Family History  Problem Relation Age of Onset   Heart attack Mother    Heart failure Father    Hypertension Father    Heart disease Father    Asthma Maternal Grandmother    Heart disease Sister    Stroke Brother    Voice disorder Daughter        vocal dystonia   Stroke Daughter    Stroke Son    Cancer Sister 76      Review of Systems  Constitutional:  Negative for diaphoresis.  Eyes:  Negative for pain.  Respiratory:  Negative for shortness of breath.   Cardiovascular:  Negative for chest pain, palpitations and leg swelling.  Gastrointestinal:  Negative for abdominal pain.  Endocrine: Negative for polydipsia.  Skin:  Negative for rash.  Neurological:  Negative for dizziness, weakness and headaches.  Hematological:  Does not bruise/bleed easily.  All other systems reviewed and are negative.      Objective:   Physical Exam Vitals and nursing note reviewed.  Constitutional:      General: She is not in acute distress.    Appearance: Normal appearance. She is well-developed.  HENT:     Head: Normocephalic.     Right Ear: Tympanic membrane normal.     Left Ear: Tympanic membrane normal.     Nose: Nose normal.     Mouth/Throat:     Mouth: Mucous membranes are moist.  Eyes:     Pupils: Pupils are equal, round, and reactive  to light.  Neck:     Vascular: No carotid bruit or JVD.  Cardiovascular:     Rate and Rhythm: Normal rate and regular rhythm.     Heart sounds: Normal heart sounds.  Pulmonary:     Effort: Pulmonary effort is normal. No respiratory distress.     Breath sounds: Normal breath sounds. No wheezing or rales.  Chest:     Chest wall: No tenderness.  Abdominal:     General: Bowel sounds are normal. There is no distension or abdominal bruit.     Palpations: Abdomen is soft. There is no hepatomegaly, splenomegaly, mass or pulsatile mass.     Tenderness: There is no abdominal tenderness.  Musculoskeletal:        General: Normal range of motion.     Cervical back: Normal range of motion and neck supple.  Lymphadenopathy:     Cervical: No cervical adenopathy.  Skin:    General: Skin is warm and dry.  Neurological:     General: No focal deficit present.     Mental Status: She is alert and oriented to person, place, and time.     Cranial Nerves: No cranial nerve deficit.     Sensory: No sensory deficit.     Deep Tendon Reflexes: Reflexes are normal and symmetric.  Psychiatric:        Behavior: Behavior normal.        Thought Content: Thought content normal.        Judgment: Judgment normal.    BP 107/63   Pulse 63   Temp 98.1 F (36.7 C) (Temporal)   Resp 20   Ht 5\' 2"  (1.575 m)   Wt 112 lb (50.8 kg)   SpO2 96%   BMI 20.49 kg/m         Assessment & Plan:   Evelyn [name] comes in today with chief complaint of Medical Management of Chronic Issues   Diagnosis and orders addressed:  1. Essential hypertension Low sodium  diet - furosemide (LASIX) 20 MG tablet; Take 1 tablet (20 mg total) by mouth daily.  Dispense: 90 tablet; Refill: 1  2. Paroxysmal atrial fibrillation Avoid caffeine - apixaban (ELIQUIS) 2.5 MG TABS tablet; Take 1 tablet (2.5 mg total) by mouth 2 (two) times daily.  Dispense: 180 tablet; Refill: 1 - diltiazem (CARDIZEM) 30 MG tablet; Take 1 tablet (30 mg  total) by mouth every 6 (six) hours as needed (for rapid heart rate).  Dispense: 30 tablet; Refill: 3 - metoprolol tartrate (LOPRESSOR) 100 MG tablet; Take 1 tablet (100 mg total) by mouth 2 (two) times daily.  Dispense: 180 tablet; Refill: 1  3. 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  Dispense: 90 tablet; Refill: 1 5. Hemispheric carotid artery syndrome Keep yearly follow up with cardiology  6. Acquired hypothyroidism Labs pending  7. Mixed hyperlipidemia Low fat diet - Lipid panel - rosuvastatin (CRESTOR) 20 MG tablet; Take 1 tablet (20 mg total) by mouth daily.  Dispense: 90 tablet; Refill: 1  8. Anxiety stess management  9. Hypokalemia Labs pending - potassium chloride (KLOR-CON) 10 MEQ tablet; Take 1 tablet (10 mEq total) by mouth daily.  Dispense: 90 tablet; Refill: 3  10. DDD (degenerative disc disease), cervical Moist heat 'rest - Drug Screen 10 W/Conf, Se - HYDROcodone-acetaminophen (NORCO) 5-325 MG tablet; Take 1 tablet by mouth every 6 (six) hours as needed for moderate pain.  Dispense: 60 tablet; Refill: 0 - HYDROcodone-acetaminophen (NORCO) 5-325 MG tablet; Take 1 tablet by mouth every 6 (six) hours as needed for moderate pain.  Dispense: 60 tablet; Refill: 0 - HYDROcodone-acetaminophen (NORCO) 5-325 MG tablet; Take 1 tablet by mouth every 6 (six) hours as needed for moderate pain.  Dispense: 60 tablet; Refill: 0  11. Pain management  12. Hospital follow up Hospital records reviewed  Labs pending Health Maintenance reviewed Diet and exercise encouraged  Follow up plan: 3 months   Mary-Margaret Daphine DeutscherMartin, FNP

## 2022-08-23 NOTE — Patient Instructions (Signed)
Transient Ischemic Attack A transient ischemic attack (TIA) causes the same symptoms as a stroke, but the symptoms go away quickly. A TIA happens when blood flow to the brain is blocked. Having a TIA means you may be at risk for a stroke. A TIA is a medical emergency. What are the causes? A TIA is caused by a blocked artery in the head or neck. This means the brain does not get the blood supply it needs. A blockage can be caused by: Fatty buildup in an artery in the head or neck. A blood clot. A tear in an artery. Irritation and swelling (inflammation) of an artery. Sometimes the cause is not known. What increases the risk? Certain things may make you more likely to have a TIA. Some of these are things that you can change, such as: Using products that have nicotine or tobacco. Not being active. Drinking too much alcohol. Using recreational drugs. Health conditions that may increase your risk include: High blood pressure. High cholesterol. Diabetes. Heart disease. A heartbeat that is not regular (atrial fibrillation). Sickle cell disease. Problems with blood clotting. Other risk factors include: Being over the age of 60. Being female. Being very overweight. Sleep problems (sleep apnea). Having a family history of stroke. Having had blood clots, stroke, TIA, or heart attack in the past. What are the signs or symptoms? The symptoms of a TIA are like those of a stroke. They can include: Weakness or loss of feeling in your face, arm, or leg. This often happens on one side of your body. Trouble walking. Trouble moving your arms or legs. Trouble talking or understanding what people are saying. Problems with how you see. Feeling dizzy. Feeling confused. Loss of balance or coordination. Feeling like you may vomit (nausea) or vomiting. Having a very bad headache. If you can, note what time you started to have symptoms. Tell your doctor. How is this treated? The goal of treatment is to  lower the risk for a stroke. This may include: Changes to diet and lifestyle, such as getting regular exercise and stopping smoking. Taking medicines to: Thin the blood. Lower blood pressure. Lower cholesterol. Treating other health conditions, such as diabetes. If testing shows that an artery in your brain is narrow, your doctor may recommend a procedure to: Take the blockage out of your artery. Open or widen an artery in your neck (carotid angioplasty and stenting). Follow these instructions at home: Medicines Take over-the-counter and prescription medicines only as told by your doctor. If you were told to take aspirin or another medicine to thin your blood, use it exactly as told by your doctor. Taking too much of the medicine can cause bleeding. Taking too little of the medicine may not work to treat the problem. Eating and drinking  Eat 5 or more servings of fruits and vegetables each day. Follow instructions from your doctor about your diet. You may need to follow a certain diet to help lower your risk of a stroke. You may need to: Eat a diet that is low in fat and salt. Eat foods with a lot of fiber. Limit carbohydrates and sugar. If you drink alcohol: Limit how much you have to: 0-1 drink a day for women who are not pregnant. 0-2 drinks a day for men. Know how much alcohol is in a drink. In the [place] one drink equals one 12 oz bottle of beer (355 mL), one 5 oz glass of wine (148 mL), or one 1 oz glass of hard   liquor (44 mL). General instructions Keep a healthy weight. Try to get at least 30 minutes of exercise on most days. Get treatment if you have sleep problems. Do not smoke or use any products that contain nicotine or tobacco. If you need help quitting, ask your doctor. Do not use drugs. Keep all follow-up visits. Your doctor will want to know if you have any more symptoms and to check blood labs if any medicines were prescribed. Where to find more  information American Stroke Association: stroke.org Get help right away if: You have chest pain. You have a heartbeat that is not regular. You have any signs of a stroke. "BE FAST" is an easy way to remember the main warning signs: B - Balance. Dizziness, sudden trouble walking, or loss of balance. E - Eyes. Trouble seeing or a change in how you see. F - Face. Sudden weakness or loss of feeling of the face. The face or eyelid may droop on one side. A - Arms. Weakness or loss of feeling in an arm. This happens all of a sudden and most often on one side of the body. S - Speech. Sudden trouble speaking, slurred speech, or trouble understanding what people say. T - Time. Time to call emergency services. Write down what time symptoms started. You have other signs of a stroke, such as: A sudden, very bad headache with no known cause. Feeling like you may vomit. Vomiting. A seizure. These symptoms may be an emergency. Get help right away. Call 911. Do not wait to see if the symptoms will go away. Do not drive yourself to the hospital. This information is not intended to replace advice given to you by your health care provider. Make sure you discuss any questions you have with your health care provider. Document Revised: 10/16/2021 Document Reviewed: 10/16/2021 Elsevier Patient Education  2023 [employer]  

## 2022-08-23 NOTE — Telephone Encounter (Signed)
Contacted patient and she states that she had lost her paper work from her hospital visit with the info that she needed but has since located it since she called last. Advised patient to contact the office with any further needs

## 2022-08-24 ENCOUNTER — Telehealth: Payer: Self-pay

## 2022-08-24 LAB — DRUG SCREEN 10 W/CONF, SERUM

## 2022-08-24 NOTE — Transitions of Care (Post Inpatient/ED Visit) (Signed)
   08/24/2022  Name: Evelyn [name] MRN: [id number] DOB: 03/04/1941  Today's TOC FU Call Status: Today's TOC FU Call Status:: Successful TOC FU Call Competed TOC FU Call Complete Date: 08/24/22  Transition Care Management Follow-up Telephone Call Date of Discharge: 08/22/22 [facility] Type of Discharge: Emergency Department Reason for ED Visit: Other: (TIA) How have you been since you were released from the hospital?: Better Any questions or concerns?: No  Items Reviewed: Did you receive and understand the discharge instructions provided?: Yes Medications obtained and verified?: Yes (Medications Reviewed) Any new allergies since your discharge?: No Dietary orders reviewed?: Yes Do you have support at home?: Yes People in Home: child(ren), adult  Home Care and Equipment/Supplies: Were Home Health Services Ordered?: NA Any new equipment or medical supplies ordered?: NA  Functional Questionnaire: Do you need assistance with bathing/showering or dressing?: No Do you need assistance with meal preparation?: No Do you need assistance with eating?: No Do you have difficulty maintaining continence: No Do you need assistance with getting out of bed/getting out of a chair/moving?: No Do you have difficulty managing or taking your medications?: No  Follow up appointments reviewed: PCP Follow-up appointment confirmed?: NA Specialist Hospital Follow-up appointment confirmed?: No Reason Specialist Follow-Up Not Confirmed: Patient has Specialist Provider Number and will Call for Appointment Do you need transportation to your follow-up appointment?: No Do you understand care options if your condition(s) worsen?: Yes-patient verbalized understanding    SIGNATURE Karena Addison, LPN [facility] Nurse Health Advisor Direct Dial [phone]

## 2022-08-26 LAB — LIPID PANEL
Cholesterol, Total: 122 mg/dL (ref 100–199)
LDL Chol Calc (NIH): 47 mg/dL (ref 0–99)
VLDL Cholesterol Cal: 12 mg/dL (ref 5–40)

## 2022-08-26 LAB — DRUG SCREEN 10 W/CONF, SERUM
Amphetamines, IA: NEGATIVE ng/mL
Benzodiazepines, IA: NEGATIVE ng/mL

## 2022-08-30 ENCOUNTER — Encounter: Payer: Self-pay | Admitting: Neurology

## 2022-08-30 LAB — OPIATES,MS,WB/SP RFX
6-Acetylmorphine: NEGATIVE
Codeine: NEGATIVE ng/mL
Dihydrocodeine: NEGATIVE ng/mL
Hydrocodone: 6.7 ng/mL
Hydromorphone: NEGATIVE ng/mL
Morphine: NEGATIVE ng/mL
Opiate Confirmation: POSITIVE

## 2022-08-30 LAB — DRUG SCREEN 10 W/CONF, SERUM
Cocaine & Metabolite, IA: NEGATIVE ng/mL
Methadone, IA: NEGATIVE ng/mL
Opiates, IA: POSITIVE ng/mL — AB
Phencyclidine, IA: NEGATIVE ng/mL
Propoxyphene, IA: NEGATIVE ng/mL

## 2022-08-30 LAB — LIPID PANEL
Chol/HDL Ratio: 1.9 ratio (ref 0.0–4.4)
HDL: 63 mg/dL (ref >39)
Triglycerides: 51 mg/dL (ref 0–149)

## 2022-08-30 LAB — OXYCODONES,MS,WB/SP RFX
Oxycocone: NEGATIVE ng/mL
Oxycodones Confirmation: NEGATIVE
Oxymorphone: NEGATIVE ng/mL

## 2022-09-07 NOTE — Progress Notes (Unsigned)
Initial neurology clinic note  Evelyn [name] MRN: [id number] DOB: 10-Feb-1941  Referring provider: Loetta Rough, MD  Primary care provider: Bennie Pierini, FNP  Reason for consult:  speech changes  Subjective:  This is Evelyn [name], a 82 y.o. right-handed female with a medical history of HTN, CAD status post CABG x 3, hypothyroidism, paroxysmal A-fib on Eliquis w/ PM?, history of TIA, ascending aortic aneurysm, aortic insufficiency who presents to neurology clinic with speech changes. The patient is alone today.  Patient had symptoms on 08/22/22 around 12:30pm. A friend called and she answered but was unable to talk. This has happened 7 times over the last 8 years, but this episode was the worst. She also had blurry vision during this episode, which had never happened before. She is not sure if she had weakness or numbness. Granddaughter was present and called EMS. She was taken to the ED. Patient's symptoms lasted about 3 hours. She had a lack of energy and had fatigue for a few days after. She had a really bad headache that day, which is unusual, and neck pain as well. She describes the headache as a dull head pain across the head. The neck hurt on the left side of head. She describes phonophobia. She also mentions that with previous headaches she had a "kaleidoscope" in her eyes, but not in this episode. She denies nausea or vomiting. She will get a headache from nitroglycerin, but this was different. She mentions that she had infrequent headaches until a few weeks prior to symptoms. She now has daily headaches. Her headaches can last a few hours to all day. She wakes up with headache sometimes and gets worse with moving around. Position does not seem to affect headaches. She mentions that her allergies have been particularly bad lately and thinks this is behind her headaches. She will take NSAIDs rarely. It does not prevent her for doing normal activities.   In ED, patient  had a normal neurologic exam. CTH and CTA head and neck were unremarkable (aortic atherosclerosis seen).  She has a history of TIAs with previous one about 1 year prior. Patient was previously seen at [facility] by Dr. Lucia Gaskins (09/17/16) for TIA. Per note, patient had acute onset dizziness on 09/01/16. She also mentions previous episodes of difficulty speaking over the last few years at that office visit. Work up for TIA at that time was unremarkable.  Caffeine use: drinks 15-20 cups of coffee per day EtOH use: rare Restrictive diet: No Sleep: sleeps about 5 hours a night for many years, no fatigue or feeling tired throughout the day  MEDICATIONS:  Outpatient Encounter Medications as of 09/09/2022  Medication Sig Note   apixaban (ELIQUIS) 2.5 MG TABS tablet Take 1 tablet (2.5 mg total) by mouth 2 (two) times daily.    Cholecalciferol (VITAMIN D3) 125 MCG (5000 UT) CAPS Take 1 capsule (5,000 Units total) by mouth daily.    diltiazem (CARDIZEM) 30 MG tablet Take 1 tablet (30 mg total) by mouth every 6 (six) hours as needed (for rapid heart rate).    furosemide (LASIX) 20 MG tablet Take 1 tablet (20 mg total) by mouth daily.    [START ON 10/22/2022] HYDROcodone-acetaminophen (NORCO) 5-325 MG tablet Take 1 tablet by mouth every 6 (six) hours as needed for moderate pain.    [START ON 09/22/2022] HYDROcodone-acetaminophen (NORCO) 5-325 MG tablet Take 1 tablet by mouth every 6 (six) hours as needed for moderate pain.    HYDROcodone-acetaminophen (NORCO)  5-325 MG tablet Take 1 tablet by mouth every 6 (six) hours as needed for moderate pain.    hydrocortisone 2.5 % cream APPLY TO AFFECTED AREA TWICE A DAY (Patient taking differently: Apply 1 Application topically daily as needed (itching).)    isosorbide mononitrate (IMDUR) 60 MG 24 hr tablet TAKE 1 TABLET (60 MG TOTAL) DAILY BY MOUTH.    levothyroxine (SYNTHROID) 88 MCG tablet Take 1 tablet (88 mcg total) by mouth daily before breakfast.    loratadine (CLARITIN) 10 MG  tablet TAKE 1 TABLET BY MOUTH EVERY DAY    methocarbamol (ROBAXIN) 500 MG tablet TAKE 1 TABLET (500 MG TOTAL) BY MOUTH EVERY 12 (TWELVE) HOURS AS NEEDED FOR MUSCLE SPASMS.    metoprolol tartrate (LOPRESSOR) 100 MG tablet Take 1 tablet (100 mg total) by mouth 2 (two) times daily.    nitroGLYCERIN (NITROSTAT) 0.4 MG SL tablet PLACE 1 TABLET UNDER THE TONGUE EVERY 5 MINUTES AS NEEDED FOR CHEST PAIN.    polyethylene glycol (MIRALAX / GLYCOLAX) 17 g packet Take 17 g by mouth daily as needed for mild constipation.    potassium chloride (KLOR-CON) 10 MEQ tablet Take 1 tablet (10 mEq total) by mouth daily.    rosuvastatin (CRESTOR) 20 MG tablet Take 1 tablet (20 mg total) by mouth daily.    senna (SENOKOT) 8.6 MG TABS tablet Take 1 tablet (8.6 mg total) by mouth 2 (two) times daily.    Zoledronic Acid (RECLAST IV) Inject into the vein. Once yearly 11/20/2021: Due in December 2023   No facility-administered encounter medications on file as of 09/09/2022.    PAST MEDICAL HISTORY: Past Medical History:  Diagnosis Date   Aneurysm    Aortic insufficiency    Atrial fibrillation    CAD (coronary artery disease)    CABG LIMA to LAD,vein graft to diagonal,vein graft to OM   Dyslipidemia    Essential hypertension 09/16/2013   GERD (gastroesophageal reflux disease)    Hypothyroid     PAST SURGICAL HISTORY: Past Surgical History:  Procedure Laterality Date   ABDOMINAL HYSTERECTOMY     BACK SURGERY  2009   BACK SURGERY     CORONARY ANGIOPLASTY  2003   cutting balloon atherectomy in-stent stenosis of the LAD.   CORONARY ANGIOPLASTY WITH STENT PLACEMENT  2002   remote PCI & stenting LAD,   CORONARY ARTERY BYPASS GRAFT  2004   LIMA to LAD,vein graft to diagonal,vein graft to OM   FOOT SURGERY     KYPHOPLASTY N/A 11/24/2021   Procedure: LUMBAR ONE KYPHOPLASTY;  Surgeon: Barnett Abu, MD;  Location: [facility] OR;  Service: Neurosurgery;  Laterality: N/A;  LUMBAR ONE KYPHOPLASTY   LEFT HEART CATH AND  CORS/GRAFTS ANGIOGRAPHY N/A 02/23/2017   Procedure: LEFT HEART CATH AND CORS/GRAFTS ANGIOGRAPHY;  Surgeon: Iran Ouch, MD;  Location: [facility] INVASIVE CV LAB;  Service: Cardiovascular;  Laterality: N/A;   LEFT HEART CATHETERIZATION WITH CORONARY ANGIOGRAM N/A 12/15/2011   Procedure: LEFT HEART CATHETERIZATION WITH CORONARY ANGIOGRAM;  Surgeon: Thurmon Fair, MD;  Location: [facility] CATH LAB;  Service: Cardiovascular;  Laterality: N/A;   LOOP RECORDER INSERTION N/A 06/14/2018   Procedure: LOOP RECORDER INSERTION;  Surgeon: Thurmon Fair, MD;  Location: [facility] INVASIVE CV LAB;  Service: Cardiovascular;  Laterality: N/A;   NECK SURGERY     NM MYOCAR PERF WALL MOTION  12/01/2011   normal study-persistent exercise induced ECG changes raises the concern for ischemia (balanced ischemia).   NOSE SURGERY     PACEMAKER INSERTION  TONSILLECTOMY  1948   TUBAL LIGATION      ALLERGIES: Allergies  Allergen Reactions   Sulfa Antibiotics Swelling    FAMILY HISTORY: Family History  Problem Relation Age of Onset   Heart attack Mother    Heart failure Father    Hypertension Father    Heart disease Father    Asthma Maternal Grandmother    Heart disease Sister    Stroke Brother    Voice disorder Daughter        vocal dystonia   Stroke Daughter    Stroke Son    Cancer Sister 2    SOCIAL HISTORY: Social History   Tobacco Use   Smoking status: Never   Smokeless tobacco: Never  Vaping Use   Vaping Use: Never used  Substance Use Topics   Alcohol use: No   Drug use: No   Social History   Social History Narrative   Mother of 2 with 4 grandchildren   Right-handed   Caffeine: 8-10 cups coffee   Pacer    Are you right handed or left handed? right   Are you currently employed ?    What is your current occupation? retired   Do you live at home alone? With son   Who lives with you?    What type of home do you live in: 1 story or 2 story? one        Objective:  Vital Signs:  BP (!)  140/72   Pulse (!) 53   Ht 5\' 2"  (1.575 m)   Wt 113 lb (51.3 kg)   SpO2 91%   BMI 20.67 kg/m   General: No acute distress.  Patient appears well-groomed.   Head:  Normocephalic/atraumatic Eyes:  fundi examined but not visualized Neck: supple, Tenderness, especially on left side of neck; reduced range of motion bilaterally Heart: Irregular Lungs: Clear to auscultation bilaterally. Vascular: No carotid bruits.  Neurological Exam: Mental status: alert and oriented, speech fluent and not dysarthric, language intact. Able to name and repeat.  Cranial nerves: CN I: not tested CN II: pupils equal, round and reactive to light, visual fields intact CN III, IV, VI:  full range of motion, no nystagmus, no ptosis CN V: facial sensation intact. CN VII: upper and lower face symmetric CN VIII: hearing intact CN IX, X: gag intact, uvula midline CN XI: sternocleidomastoid and trapezius muscles intact CN XII: tongue midline  Bulk & Tone: normal, no fasciculations. Motor:  muscle strength 5/5 throughout Deep Tendon Reflexes:  2+ throughout  Sensation:  Pinprick sensation intact. Finger to nose testing:  Without dysmetria.   Gait:  Normal station and stride.  Romberg negative.   Labs and Imaging review: Internal labs: Lab Results  Component Value Date   HGBA1C 5.8 (H) 09/17/2016   No results found for: "VITAMINB12" Lab Results  Component Value Date   TSH 1.100 05/24/2022   No results found for: "ESRSEDRATE", "POCTSEDRATE"  Lipid panel (08/23/22): Component     Latest Ref Rng 08/23/2022  Cholesterol, Total     100 - 199 mg/dL 161   Triglycerides     0 - 149 mg/dL 51   HDL Cholesterol     >39 mg/dL 63   VLDL Cholesterol Cal     5 - 40 mg/dL 12   LDL Chol Calc (NIH)     0 - 99 mg/dL 47   Total CHOL/HDL Ratio     0.0 - 4.4 ratio 1.9    Vit D (05/24/22):  50.4  Imaging: CT head wo contrast (08/22/22): FINDINGS: Brain: No evidence of intracranial hemorrhage, acute  infarction, hydrocephalus, extra-axial collection, or mass lesion/mass effect. Mild diffuse cerebral atrophy noted.   Vascular:  No hyperdense vessel or other acute findings.   Skull: No evidence of fracture or other significant bone abnormality.   Sinuses/Orbits:  No acute findings.   Other: None.   IMPRESSION: No acute intracranial abnormality. Mild cerebral atrophy.  CTA head and neck (08/22/22): CTA NECK FINDINGS   Aortic arch: Standard branching. Imaged portion shows no evidence of aneurysm or dissection. No significant stenosis of the major arch vessel origins. Mild aortic atherosclerosis appear   Right carotid system: No evidence of dissection, occlusion, or hemodynamically significant stenosis (greater than 50%).   Left carotid system: No evidence of dissection, occlusion, or hemodynamically significant stenosis (greater than 50%).   Vertebral arteries: No evidence of dissection, occlusion, or hemodynamically significant stenosis (greater than 50%).   Skeleton: No acute osseous abnormality. Degenerative changes in the cervical spine. Status post ACDF C5-C7. Status post median sternotomy   Other neck: Multinodular goiter, most recently evaluated with ultrasound on 11/05/2020.   Upper chest: No focal pulmonary opacity or pleural effusion.   Review of the MIP images confirms the above findings   CTA HEAD FINDINGS   Anterior circulation: Both internal carotid arteries are patent to the termini, without significant stenosis.   A1 segments patent. Normal anterior communicating artery. Anterior cerebral arteries are patent to their distal aspects without significant stenosis.   No M1 stenosis or occlusion. MCA branches perfused to their distal aspects without significant stenosis.   Posterior circulation: Vertebral arteries patent to the vertebrobasilar junction without significant stenosis. Posterior inferior cerebellar arteries patent proximally.   Basilar  patent to its distal aspect without significant stenosis. Superior cerebellar arteries patent proximally.   Patent P1 segments. PCAs perfused to their distal aspects without significant stenosis. The bilateral posterior communicating arteries are not visualized.   Venous sinuses: As permitted by contrast timing, patent.   Anatomic variants: None significant.   Review of the MIP images confirms the above findings   IMPRESSION: 1. No intracranial large vessel occlusion or significant stenosis. 2. No hemodynamically significant stenosis in the neck. 3. Aortic atherosclerosis.  CT lumbar spine wo contrast (04/28/22): IMPRESSION: 1. Compared to prior exam there has been an interval kyphoplasty at the L1 level. No evidence of progressive height loss. No new compression deformities are visualized. 2. Multilevel degenerative changes throughout the lumbar spine with moderate spinal canal narrowing at the L3-L4 and L4-L5 levels, unchanged.  MRI brain wo contrast (09/01/16): FINDINGS: Brain: No restricted diffusion to suggest acute infarction. No midline shift, mass effect, evidence of mass lesion, ventriculomegaly, extra-axial collection or acute intracranial hemorrhage. Cervicomedullary junction and pituitary are within normal limits.   Wallace Cullens and white matter signal is within normal limits for age throughout the brain. No cortical encephalomalacia. No chronic cerebral blood products. Deep gray matter nuclei, brainstem and cerebellum appear normal.   Vascular: Major intracranial vascular flow voids are preserved. Mild intracranial artery tortuosity.   Skull and upper cervical spine: Partially visible cervical ACDF hardware artifact. Chronic C3-C4 disc and endplate degeneration.   Normal bone marrow signal.   Sinuses/Orbits: Normal orbits soft tissues. Visualized paranasal sinuses and mastoids are well pneumatized.   Other: Visible internal auditory structures appear normal.  Negative scalp soft tissues.   IMPRESSION: 1. No acute intracranial abnormality. Normal for age noncontrast MRI appearance of the brain. 2. Chronic cervical  spine degeneration. Partially visible cervical ACDF.  MRI cervical spine wo contrast (06/12/2007): IMPRESSION:  1. C3-4: Spondylosis with bilateral neural foraminal encroachment by osteophytes.  2. C4-5: Spondylosis without apparent neural compressive pathology. Mild foraminal narrowing on the right due to osteophytes.  3. C5-6: Spondylosis with canal narrowing resulting in effacement of the subarachnoid space. Bilateral neural foraminal encroachment could affect either C6 nerve root.   4. C6-7: Spondylosis with mild to moderate foraminal encroachment bilaterally.  5. C7-T1: Facet arthropathy with minimal anterolisthesis. No apparent neural compression.     Loop recorder (11/16/21): ILR summary report received. Battery status OK. Normal device function. No new symptom, tachy, brady, or pause episodes. No new AF episodes. AF burden is 0% of the time.   Echo (10/12/16): Study Conclusions   - Left ventricle: The cavity size was normal. Systolic function was    normal. The estimated ejection fraction was in the range of 55%    to 60%. Mild hypokinesis of the inferior and inferoseptal    myocardium. Doppler parameters are consistent with abnormal left    ventricular relaxation (grade 1 diastolic dysfunction). Doppler    parameters are consistent with indeterminate ventricular filling    pressure.  - Aortic valve: Transvalvular velocity was within the normal range.    There was no stenosis. There was mild regurgitation.  - Aorta: Ascending aortic diameter: 37 mm (S).  - Ascending aorta: The ascending aorta was mildly dilated.  - Mitral valve: Transvalvular velocity was within the normal range.    There was no evidence for stenosis. There was mild regurgitation.  - Right ventricle: The cavity size was normal. Wall thickness was     normal. Systolic function was normal.  - Atrial septum: No defect or patent foramen ovale was identified    by color flow Doppler or saline microcavitation study.  - Tricuspid valve: There was moderate regurgitation.  - Pulmonary arteries: Systolic pressure was within the normal    range. PA peak pressure: 27 mm Hg (S).  LA was normal in size.  Assessment/Plan:  Evelyn [name] is a 82 y.o. female who presents for evaluation of recurrent episodes of aphasia, most recently on 08/22/22 with associated blurry vision and headache. She has a relevant medical history of HTN, CAD status post CABG x 3, hypothyroidism, paroxysmal A-fib on Eliquis w/ PM?, history of TIA, ascending aortic aneurysm, aortic insufficiency. Her neurological examination is essentially normal today. Available diagnostic data is significant for CT head showing no acute process and CTA head and neck showing no large vessel occlusion or significant stenosis.   The etiology of patient's symptoms is currently unclear. She certainly has risk factors for TIA and stroke, which could be responsible for the recent episode, however, recurrent similar episodes over the past 8 years would be unusual for stroke or TIA without a fixed stenosis, which per CTA is not present. Other considerations include migraine or seizure. Patient has no known risk factors for seizure, so this may be less likely, but possible. Migraine is an interesting possible etiology as she describes increased headaches prior to this current episode, perhaps worsened by seasonal allergies. She also describes what sounds like visual aura with previous headaches as well as phonophobia. She also has significant chronic neck pain that may be contributing to recent increase in headaches. If etiology is felt to be migraine, treating may be difficult given co-morbidities, which we discussed. Patient agreed to try non-pharmacologic and natural remedies first.  PLAN: -Blood work: B1,  B12 -MRI brain wo contrast -Physical therapy for neck pain -Discussed cutting back on caffeine as able -Gave OTC/vitamin recommendations for headache -Continue allergy medicine as needed (Claritin) -Patient will call if she another episode or concerning symptoms. -Will consider EEG if episodes recur despite improvement in headaches -Continue eliquis for afib  -Return to clinic in about 3 month  The impression above as well as the plan as outlined below were extensively discussed with the patient who voiced understanding. All questions were answered to their satisfaction  When available, results of the above investigations and possible further recommendations will be communicated to the patient via telephone/MyChart. Patient to call office if not contacted after expected testing turnaround time.   Total time spent reviewing records, interview, history/exam, documentation, and coordination of care on day of encounter:  60 min   Thank you for allowing me to participate in patient's care.  If I can answer any additional questions, I would be pleased to do so.  Jacquelyne Balint, MD   CC: Bennie Pierini, FNP [address] [place] [address]  CC: Referring provider: Loetta Rough, MD [address] [place],  [place] [address]

## 2022-09-09 ENCOUNTER — Encounter: Payer: Self-pay | Admitting: Neurology

## 2022-09-09 ENCOUNTER — Other Ambulatory Visit (INDEPENDENT_AMBULATORY_CARE_PROVIDER_SITE_OTHER): Payer: Medicare HMO

## 2022-09-09 ENCOUNTER — Ambulatory Visit: Payer: Medicare HMO | Admitting: Neurology

## 2022-09-09 VITALS — BP 140/72 | HR 53 | Ht 62.0 in | Wt 113.0 lb

## 2022-09-09 DIAGNOSIS — H539 Unspecified visual disturbance: Secondary | ICD-10-CM

## 2022-09-09 DIAGNOSIS — R29818 Other symptoms and signs involving the nervous system: Secondary | ICD-10-CM

## 2022-09-09 DIAGNOSIS — M542 Cervicalgia: Secondary | ICD-10-CM

## 2022-09-09 DIAGNOSIS — R4701 Aphasia: Secondary | ICD-10-CM

## 2022-09-09 DIAGNOSIS — H538 Other visual disturbances: Secondary | ICD-10-CM | POA: Diagnosis not present

## 2022-09-09 DIAGNOSIS — G43109 Migraine with aura, not intractable, without status migrainosus: Secondary | ICD-10-CM

## 2022-09-09 LAB — VITAMIN B12: Vitamin B-12: 121 pg/mL — ABNORMAL LOW (ref 211–911)

## 2022-09-09 NOTE — Patient Instructions (Addendum)
I saw you today for the episode of not being able to talk. This could have been a TIA or stroke, but maybe not. I will get an MRI of your brain to look into this further. I will also get blood work today.  It is also possible that headaches, specifically migraines are responsible for your symptoms. I would have to be careful with any medications I give you though due to your heart problems and other medical conditions. I would like to try to help your headaches in other ways and see if this helps prevent the episodes as well.  I am referring you to physical therapy for neck pain as this often contributes to headache.  Continue to take your allergy medicine as needed.  Headache recommendation: Limit use of pain relievers to no more than 2 days out of the week.  These medications include acetaminophen, NSAIDs (ibuprofen/Advil/Motrin, naproxen/Aleve, triptans (Imitrex/sumatriptan), Excedrin, and narcotics.  This will help reduce risk of rebound headaches. Be aware of common food triggers:  - Caffeine:  coffee, black tea, cola, Mt. Dew  - Chocolate  - Dairy:  aged cheeses (brie, blue, cheddar, gouda, [place], provolone, [place], Swiss, etc), chocolate milk, buttermilk, sour cream, limit eggs and yogurt  - Nuts, peanut butter  - Alcohol  - Cereals/grains:  FRESH breads (fresh bagels, sourdough, doughnuts), yeast productions  - Processed/canned/aged/cured meats (pre-packaged deli meats, hotdogs)  - MSG/glutamate:  soy sauce, flavor enhancer, pickled/preserved/marinated foods  - Sweeteners:  aspartame (Equal, Nutrasweet).  Sugar and Splenda are okay  - Vegetables:  legumes (lima beans, lentils, snow peas, fava beans, pinto peans, peas, garbanzo beans), sauerkraut, onions, olives, pickles  - Fruit:  avocados, bananas, citrus fruit (orange, lemon, grapefruit), mango  - Other:  Frozen meals, macaroni and cheese Routine exercise Stay adequately hydrated (aim for 64 oz water daily) Keep headache  diary Maintain proper stress management Maintain proper sleep hygiene Do not skip meals Consider supplements:  magnesium citrate  daily, riboflavin  daily, coenzyme Q10  three times daily.  If you have new difficulty speaking, face droop, numbness on one side of the body, weakness on one side of the body, or dizziness/imbalance, this could be the sign of a stroke. Don't wait, please call EMS and be evaluated at the nearest emergency room.   The physicians and staff at [facility] are committed to providing excellent care. You may receive a survey requesting feedback about your experience at our office. We strive to receive "very good" responses to the survey questions. If you feel that your experience would prevent you from giving the office a "very good " response, please contact our office to try to remedy the situation. We may be reached at [phone]. Thank you for taking the time out of your busy day to complete the survey.  Jacquelyne Balint, MD [facility]

## 2022-09-11 ENCOUNTER — Other Ambulatory Visit: Payer: Self-pay | Admitting: "Endocrinology

## 2022-09-13 ENCOUNTER — Telehealth: Payer: Self-pay

## 2022-09-13 LAB — VITAMIN B1: Vitamin B1 (Thiamine): 11 nmol/L (ref 8–30)

## 2022-09-13 NOTE — Telephone Encounter (Signed)
Called pt and informed her of the approval for MRI B. Approval # [id number]. And gave her the Scheduling # for [facility].

## 2022-09-20 ENCOUNTER — Ambulatory Visit (HOSPITAL_COMMUNITY): Payer: Medicare HMO

## 2022-09-21 ENCOUNTER — Telehealth: Payer: Self-pay | Admitting: Nurse Practitioner

## 2022-09-23 ENCOUNTER — Encounter (HOSPITAL_COMMUNITY): Payer: Self-pay

## 2022-09-23 ENCOUNTER — Ambulatory Visit (HOSPITAL_COMMUNITY)
Admission: RE | Admit: 2022-09-23 | Discharge: 2022-09-23 | Disposition: A | Discharge status: Home / Self Care | Payer: Medicare HMO | Source: Ambulatory Visit | Attending: Neurology | Admitting: Neurology

## 2022-09-23 DIAGNOSIS — G43109 Migraine with aura, not intractable, without status migrainosus: Secondary | ICD-10-CM | POA: Diagnosis present

## 2022-09-23 DIAGNOSIS — R29818 Other symptoms and signs involving the nervous system: Secondary | ICD-10-CM | POA: Diagnosis present

## 2022-09-23 DIAGNOSIS — H538 Other visual disturbances: Secondary | ICD-10-CM | POA: Diagnosis present

## 2022-09-23 DIAGNOSIS — M542 Cervicalgia: Secondary | ICD-10-CM

## 2022-09-23 DIAGNOSIS — H539 Unspecified visual disturbance: Secondary | ICD-10-CM

## 2022-09-23 DIAGNOSIS — R4701 Aphasia: Secondary | ICD-10-CM | POA: Diagnosis present

## 2022-09-24 NOTE — Telephone Encounter (Signed)
Patient want to go to [facility] or reclast and cortney says she does not do this.

## 2022-09-27 ENCOUNTER — Other Ambulatory Visit: Payer: Self-pay

## 2022-09-27 ENCOUNTER — Ambulatory Visit: Payer: Medicare HMO | Attending: Neurology | Admitting: Physical Therapy

## 2022-09-27 DIAGNOSIS — H539 Unspecified visual disturbance: Secondary | ICD-10-CM | POA: Insufficient documentation

## 2022-09-27 DIAGNOSIS — M62838 Other muscle spasm: Secondary | ICD-10-CM | POA: Insufficient documentation

## 2022-09-27 DIAGNOSIS — M542 Cervicalgia: Secondary | ICD-10-CM | POA: Insufficient documentation

## 2022-09-27 NOTE — Telephone Encounter (Signed)
Form faxed and given to Irving Burton, FNP nurse incase they have any questions.

## 2022-09-27 NOTE — Therapy (Signed)
OUTPATIENT PHYSICAL THERAPY CERVICAL EVALUATION   Patient Name: Evelyn [name] MRN: [id number] DOB:June 22, 1940, 82 y.o., female Today's Date: 09/27/2022  END OF SESSION:  PT End of Session - 09/27/22 1325     Visit Number 1    Number of Visits 8    Date for PT Re-Evaluation 12/27/22    PT Start Time 1258    PT Stop Time 1327    PT Time Calculation (min) 29 min    Activity Tolerance Patient tolerated treatment well    Behavior During Therapy [facility] for tasks assessed/performed             Past Medical History:  Diagnosis Date   Aneurysm (HCC)    Aortic insufficiency    Atrial fibrillation (HCC)    CAD (coronary artery disease)    CABG LIMA to LAD,vein graft to diagonal,vein graft to OM   Dyslipidemia    Essential hypertension 09/16/2013   GERD (gastroesophageal reflux disease)    Hypothyroid    Past Surgical History:  Procedure Laterality Date   ABDOMINAL HYSTERECTOMY     BACK SURGERY  2009   BACK SURGERY     CORONARY ANGIOPLASTY  2003   cutting balloon atherectomy in-stent stenosis of the LAD.   CORONARY ANGIOPLASTY WITH STENT PLACEMENT  2002   remote PCI & stenting LAD,   CORONARY ARTERY BYPASS GRAFT  2004   LIMA to LAD,vein graft to diagonal,vein graft to OM   FOOT SURGERY     KYPHOPLASTY N/A 11/24/2021   Procedure: LUMBAR ONE KYPHOPLASTY;  Surgeon: Barnett Abu, MD;  Location: [facility] OR;  Service: Neurosurgery;  Laterality: N/A;  LUMBAR ONE KYPHOPLASTY   LEFT HEART CATH AND CORS/GRAFTS ANGIOGRAPHY N/A 02/23/2017   Procedure: LEFT HEART CATH AND CORS/GRAFTS ANGIOGRAPHY;  Surgeon: Iran Ouch, MD;  Location: [facility] INVASIVE CV LAB;  Service: Cardiovascular;  Laterality: N/A;   LEFT HEART CATHETERIZATION WITH CORONARY ANGIOGRAM N/A 12/15/2011   Procedure: LEFT HEART CATHETERIZATION WITH CORONARY ANGIOGRAM;  Surgeon: Thurmon Fair, MD;  Location: [facility] CATH LAB;  Service: Cardiovascular;  Laterality: N/A;   LOOP RECORDER INSERTION N/A 06/14/2018   Procedure: LOOP  RECORDER INSERTION;  Surgeon: Thurmon Fair, MD;  Location: [facility] INVAS[facility] CV LAB;  Service: Cardiovascular;  Laterality: N/A;   NECK SURGERY     NM MYOCAR PERF WALL MOTION  12/01/2011   normal study-persistent exercise induced ECG changes raises the concern for ischemia (balanced ischemia).   NOSE SURGERY     PACEMAKER INSERTION     NOT PACEMAKER - LOOP RECORDER   TONSILLECTOMY  1948   TUBAL LIGATION     Patient Active Problem List   Diagnosis Date Noted   Intractable back pain 11/20/2021   Acquired thrombophilia (HCC) 11/20/2021   Hypokalemia 11/20/2021   Compression fracture of L1 vertebra (HCC) 11/20/2021   Paroxysmal atrial fibrillation (HCC) 12/18/2019   Cervical radiculopathy at C5 08/07/2019   Long term (current) use of anticoagulants 11/25/2018   DDD (degenerative disc disease), cervical 10/17/2018   Mixed hyperlipidemia 10/17/2018   Hemispheric carotid artery syndrome 06/14/2018   Pain management 08/01/2017   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 CABG X 3 2004 12/15/2011   Hypothyroidism 12/15/2011     REFERRING PROVIDER: Jacquelyne Balint MD  REFERRING DIAG: Cervicalgia.  THERAPY DIAG:  Cervicalgia - Plan: PT plan of care cert/re-cert  Other muscle spasm - Plan: PT plan of care cert/re-cert  Rationale for Evaluation and Treatment: Rehabilitation  ONSET DATE: "Several months".  SUBJECTIVE:                                                                                                                                                                                                          SUBJECTIVE STATEMENT: The patient presents to the clinic with c/o left-sided neck pain over the last several months.  She rates her pain at a hight 9/10 today.  She has not found anything really decreases or increases her pain.  She was having headaches but attributed it to allergies and she is nolonger experiencing them.  She had an ED visit in April of this year for an expected TIA.   Hand dominance: Right  PERTINENT HISTORY:  Loop recorded, TIA's, ACDF, lumbar kyphoplasty, OP, aortic aneurysm.  PAIN:  Are you having pain? Yes: NPRS scale: 9./10 Pain location: Left side of neck. Pain description: Shooting and sore. Aggravating factors: See above. Relieving factors: See above.  PRECAUTIONS: Other: Loop recorder.  WEIGHT BEARING RESTRICTIONS: No  FALLS:  Has patient fallen in last 6 months? No  LIVING ENVIRONMENT: Lives with: lives with their family Lives in: House/apartment Has following equipment at home: None  OCCUPATION: Retired.  PLOF: Independent  PATIENT GOALS: Not have neck pain.   OBJECTIVE:   POSTURE: No Significant postural limitations  PALPATION: Tender to palpation over left UT with notable trigger point and some tenderness over scalenes.   CERVICAL ROM:   Left active cervical rotation is 50 degrees and right is 52 degrees.  UPPER EXTREMITY ROM:  WNL.  UPPER EXTREMITY MMT:  Normal bilateral UE strength.  DTR's:  Normal UE DTR's.    PATIENT EDUCATION:  Education details: Discussed correct posture especially while reading. Person educated: Patient Education method: Explanation Education comprehension: verbalized understanding  HOME EXERCISE PROGRAM:   ASSESSMENT:  CLINICAL IMPRESSION: The patient presents to [facility] with c/o left-sided neck pain over the last several months.  She has tenderness with trigger point  over her left UT and some tenderness over her scalene musculature. Her UE DTR's are normal.  She has normal bilateral UE strength.  She has  limited active bilateral cervical rotation.  Patient will benefit from skilled physical therapy intervention to address pain and deficits.  OBJECTIVE IMPAIRMENTS: decreased ROM, increased muscle spasms, and pain.   ACTIVITY LIMITATIONS: sitting  PERSONAL FACTORS: 1 comorbidity: ACDF  are also affecting patient's functional outcome.   REHAB POTENTIAL: Good  CLINICAL DECISION MAKING: Stable/uncomplicated  EVALUATION COMPLEXITY: Low   GOALS:  SHORT TERM GOALS: Target date: 10/11/22  Ind with an HEP.  Goal status: INITIAL   LONG TERM GOALS: Target date: 12/26/22.  Perform ADL's with pain not > 3/10.  Goal status: INITIAL  2.  Read for one hour with pain not > 3/10.  Goal status: INITIAL   PLAN:  PT FREQUENCY: 1-2x/week  PT DURATION: 6 weeks  PLANNED INTERVENTIONS: Therapeutic exercises, Therapeutic activity, Patient/Family education, Self Care, Cryotherapy, Moist heat, and Manual therapy  PLAN FOR NEXT SESSION: STW/M, trigger point release, postural exercises.   Anishka Bushard, [place], PT 09/27/2022, 2:01 PM

## 2022-09-27 NOTE — Telephone Encounter (Signed)
There is a form that needs to be filled out. I will get it and bring it to nurse.

## 2022-10-04 ENCOUNTER — Ambulatory Visit: Payer: Medicare HMO | Admitting: Nurse Practitioner

## 2022-10-06 ENCOUNTER — Ambulatory Visit: Payer: Medicare HMO | Admitting: Physical Therapy

## 2022-10-06 DIAGNOSIS — M542 Cervicalgia: Secondary | ICD-10-CM

## 2022-10-06 DIAGNOSIS — M62838 Other muscle spasm: Secondary | ICD-10-CM

## 2022-10-06 NOTE — Therapy (Signed)
OUTPATIENT PHYSICAL THERAPY CERVICAL EVALUATION   Patient Name: Evelyn [name] MRN: [id number] DOB:Nov 24, 1940, 82 y.o., female Today's Date: 10/06/2022  END OF SESSION:  PT End of Session - 10/06/22 1108     Visit Number 2    Number of Visits 8    Date for PT Re-Evaluation 12/27/22    PT Start Time 1032    PT Stop Time 1109    PT Time Calculation (min) 37 min    Activity Tolerance Patient tolerated treatment well    Behavior During Therapy [facility] for tasks assessed/performed             Past Medical History:  Diagnosis Date   Aneurysm (HCC)    Aortic insufficiency    Atrial fibrillation (HCC)    CAD (coronary artery disease)    CABG LIMA to LAD,vein graft to diagonal,vein graft to OM   Dyslipidemia    Essential hypertension 09/16/2013   GERD (gastroesophageal reflux disease)    Hypothyroid    Past Surgical History:  Procedure Laterality Date   ABDOMINAL HYSTERECTOMY     BACK SURGERY  2009   BACK SURGERY     CORONARY ANGIOPLASTY  2003   cutting balloon atherectomy in-stent stenosis of the LAD.   CORONARY ANGIOPLASTY WITH STENT PLACEMENT  2002   remote PCI & stenting LAD,   CORONARY ARTERY BYPASS GRAFT  2004   LIMA to LAD,vein graft to diagonal,vein graft to OM   FOOT SURGERY     KYPHOPLASTY N/A 11/24/2021   Procedure: LUMBAR ONE KYPHOPLASTY;  Surgeon: Barnett Abu, MD;  Location: [facility];  Service: Neurosurgery;  Laterality: N/A;  LUMBAR ONE KYPHOPLASTY   LEFT HEART CATH AND CORS/GRAFTS ANGIOGRAPHY N/A 02/23/2017   Procedure: LEFT HEART CATH AND CORS/GRAFTS ANGIOGRAPHY;  Surgeon: Iran Ouch, MD;  Location: [facility] INVAS[facility] CV LAB;  Service: Cardiovascular;  Laterality: N/A;   LEFT HEART CATHETERIZATION WITH CORONARY ANGIOGRAM N/A 12/15/2011   Procedure: LEFT HEART CATHETERIZATION WITH CORONARY ANGIOGRAM;  Surgeon: Thurmon Fair, MD;  [facility]: [facility] CATH [facility];  Service: Cardiovascular;  Laterality: N/A;   LOOP RECORDER INSERTION N/A 06/14/2018   Procedure: LOOP  RECORDER INSERTION;  Surgeon: Thurmon Fair, MD;  [facility]: [facility];  Service: Cardiovascular;  Laterality: N/A;   NECK SURGERY     NM MYOCAR PERF WALL MOTION  12/01/2011   normal study-persistent exercise induced ECG changes raises the concern for ischemia (balanced ischemia).   NOSE SURGERY     PACEMAKER INSERTION     NOT PACEMAKER - LOOP RECORDER   TONSILLECTOMY  1948   TUBAL LIGATION     Patient Active Problem List   Diagnosis Date Noted   Intractable back pain 11/20/2021   Acquired thrombophilia (HCC) 11/20/2021   Hypokalemia 11/20/2021   Compression fracture of L1 vertebra (HCC) 11/20/2021   Paroxysmal atrial fibrillation (HCC) 12/18/2019   Cervical radiculopathy at C5 08/07/2019   Long term (current) use of anticoagulants 11/25/2018   DDD (degenerative disc disease), cervical 10/17/2018   Mixed hyperlipidemia 10/17/2018   Hemispheric carotid artery syndrome 06/14/2018   Pain management 08/01/2017   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 CABG X 3 2004 12/15/2011   Hypothyroidism 12/15/2011     REFERRING PROVIDER: Jacquelyne Balint MD  REFERRING DIAG: Cervicalgia.  THERAPY DIAG:  Cervicalgia  Other muscle spasm  Rationale for Evaluation and Treatment: Rehabilitation  ONSET DATE: "Several months".  SUBJECTIVE:                                                                                                                                                                                                          SUBJECTIVE STATEMENT: Pain at a 7.  PERTINENT HISTORY:  Loop recorded, TIA's, ACDF, lumbar kyphoplasty, OP, aortic aneurysm.  PAIN:  Are you having pain? Yes: NPRS scale: 7/10 Pain location: Left side of neck. Pain description: Shooting and sore. Aggravating factors: See above. Relieving factors: See above.  PRECAUTIONS: Other: Loop recorder.  WEIGHT BEARING RESTRICTIONS: No  FALLS:  Has patient fallen in last 6 months? No  LIVING ENVIRONMENT: Lives with: lives with their family Lives in: House/apartment Has following equipment at home: None  OCCUPATION: Retired.  PLOF: Independent  PATIENT GOALS: Not have neck pain.   OBJECTIVE:   POSTURE: No Significant postural limitations  PALPATION: Tender to palpation over left UT with notable trigger point and some tenderness over scalenes.   CERVICAL ROM:   Left active cervical rotation is 50 degrees and right is 52 degrees.  UPPER EXTREMITY ROM:  WNL.  UPPER EXTREMITY MMT:  Normal bilateral UE strength.  DTR's:  Normal UE DTR's.    PATIENT EDUCATION:  Education details: Discussed correct posture especially while reading. Person educated: Patient Education method: Explanation Education comprehension: verbalized understanding  HOME EXERCISE PROGRAM:  TODAY's TX:  STW/M x 23 minutes to patient's left UT with ischemic release technique utilized f/b HMP x 10 minutes.   ASSESSMENT:  CLINICAL IMPRESSION: Patient with increased tone over her left UT.  She did well with STW/M and felt better after treatment.  Instructed patient in chin tucks and gentle pain-free cervical extension.  Excellent demo from patient.    OBJECTIVE IMPAIRMENTS: decreased ROM, increased muscle spasms, and pain.   ACTIVITY LIMITATIONS: sitting  PERSONAL FACTORS: 1 comorbidity: ACDF  are also affecting patient's functional outcome.   REHAB POTENTIAL: Good  CLINICAL DECISION  MAKING: Stable/uncomplicated  EVALUATION COMPLEXITY: Low   GOALS:  SHORT TERM GOALS: Target date: 10/11/22  Ind with an HEP.  Goal status: INITIAL   LONG TERM GOALS: Target date: 12/26/22.  Perform ADL's with  pain not > 3/10.  Goal status: INITIAL  2.  Read for one hour with pain not > 3/10.  Goal status: INITIAL   PLAN:  PT FREQUENCY: 1-2x/week  PT DURATION: 6 weeks  PLANNED INTERVENTIONS: Therapeutic exercises, Therapeutic activity, Patient/Family education, Self Care, Cryotherapy, Moist heat, and Manual therapy  PLAN FOR NEXT SESSION: STW/M, trigger point release, postural exercises.   Amaiah Cristiano, [place], PT 10/06/2022, 11:18 AM

## 2022-10-13 ENCOUNTER — Ambulatory Visit: Payer: Medicare HMO | Admitting: Physical Therapy

## 2022-10-13 DIAGNOSIS — M542 Cervicalgia: Secondary | ICD-10-CM | POA: Diagnosis not present

## 2022-10-13 DIAGNOSIS — M62838 Other muscle spasm: Secondary | ICD-10-CM

## 2022-10-13 NOTE — Therapy (Signed)
OUTPATIENT PHYSICAL THERAPY CERVICAL TREATMENT  Patient Name: Evelyn [name] MRN: [id number] DOB:[birth year], 82 y.o., female Today's Date: 10/13/2022  END OF SESSION:  PT End of Session - 10/13/22 1242     Visit Number 3    Date for PT Re-Evaluation 12/27/22    PT Start Time 1030    PT Stop Time 1109    PT Time Calculation (min) 39 min    Activity Tolerance Patient tolerated treatment well    Behavior During Therapy [facility] for tasks assessed/performed             Past Medical History:  Diagnosis Date   Aneurysm (HCC)    Aortic insufficiency    Atrial fibrillation (HCC)    CAD (coronary artery disease)    CABG LIMA to LAD,vein graft to diagonal,vein graft to OM   Dyslipidemia    Essential hypertension 09/16/2013   GERD (gastroesophageal reflux disease)    Hypothyroid    Past Surgical History:  Procedure Laterality Date   ABDOMINAL HYSTERECTOMY     BACK SURGERY  2009   BACK SURGERY     CORONARY ANGIOPLASTY  2003   cutting balloon atherectomy in-stent stenosis of the LAD.   CORONARY ANGIOPLASTY WITH STENT PLACEMENT  2002   remote PCI & stenting LAD,   CORONARY ARTERY BYPASS GRAFT  2004   LIMA to LAD,vein graft to diagonal,vein graft to OM   FOOT SURGERY     KYPHOPLASTY N/A 11/24/2021   Procedure: LUMBAR ONE KYPHOPLASTY;  Surgeon: Barnett Abu, MD;  Location: [facility] OR;  Service: Neurosurgery;  Laterality: N/A;  LUMBAR ONE KYPHOPLASTY   LEFT HEART CATH AND CORS/GRAFTS ANGIOGRAPHY N/A 02/23/2017   Procedure: LEFT HEART CATH AND CORS/GRAFTS ANGIOGRAPHY;  Surgeon: Iran Ouch, MD;  [facility];  Service: Cardiovascular;  Laterality: N/A;   LEFT HEART CATHETERIZATION WITH CORONARY ANGIOGRAM N/A 12/15/2011   Procedure: LEFT HEART CATHETERIZATION WITH CORONARY ANGIOGRAM;  Surgeon: Thurmon Fair, MD;  Location: [facility] CATH [facility];  Service: Cardiovascular;  Laterality: N/A;   LOOP RECORDER INSERTION N/A 06/14/2018   Procedure: LOOP RECORDER INSERTION;  Surgeon:  Thurmon Fair, MD;  Location: [facility] IN[facility]AS[facility] CV LAB;  Service: Cardiovascular;  Laterality: N/A;   NECK SURGERY     NM MYOCAR PERF WALL MOTION  12/01/2011   normal study-persistent exercise induced ECG changes raises the concern for ischemia (balanced ischemia).   NOSE SURGERY     PACEMAKER INSERTION     NOT PACEMAKER - LOOP RECORDER   TONSILLECTOMY  1948   TUBAL LIGATION     Patient Active Problem List   Diagnosis Date Noted   Intractable back pain 11/20/2021   Acquired thrombophilia (HCC) 11/20/2021   Hypokalemia 11/20/2021   Compression fracture of L1 vertebra (HCC) 11/20/2021   Paroxysmal atrial fibrillation (HCC) 12/18/2019   Cervical radiculopathy at C5 08/07/2019   Long term (current) use of anticoagulants 11/25/2018   DDD (degenerative disc disease), cervical 10/17/2018   Mixed hyperlipidemia 10/17/2018   Hemispheric carotid artery syndrome 06/14/2018   Pain management 08/01/2017   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 CABG X 3 2004 12/15/2011   Hypothyroidism 12/15/2011     REFERRING PROVIDER: Jacquelyne Balint MD  REFERRING DIAG: Cervicalgia.  THERAPY DIAG:  Cervicalgia  Other muscle spasm  Rationale for Evaluation and Treatment: Rehabilitation  ONSET DATE: "Several months".  SUBJECTIVE:                                                                                                                                                                                                          SUBJECTIVE STATEMENT: Pain low.  Last treatment helped.    PERTINENT HISTORY:  Loop recorded, TIA's, ACDF, lumbar kyphoplasty, OP, aortic aneurysm.  PAIN:  Are you having pain? Yes: NPRS scale: 1-2/10 Pain location: Left side of neck. Pain description: Shooting and sore. Aggravating factors: See above. Relieving factors: See above.  PRECAUTIONS: Other: Loop recorder.  WEIGHT BEARING RESTRICTIONS: No  FALLS:  Has patient fallen in last 6 months? No  LIVING ENVIRONMENT: Lives with: lives with their family Lives in: House/apartment Has following equipment at home: None  OCCUPATION: Retired.  PLOF: Independent  PATIENT GOALS: Not have neck pain.   OBJECTIVE:   POSTURE: No Significant postural limitations  PALPATION: Tender to palpation over left UT with notable trigger point and some tenderness over scalenes.   CERVICAL ROM:   Left active cervical rotation is 50 degrees and right is 52 degrees.  UPPER EXTREMITY ROM:  WNL.  UPPER EXTREMITY MMT:  Normal bilateral UE strength.  DTR's:  Normal UE DTR's.    PATIENT EDUCATION:  Education details: Discussed correct posture especially while reading. Person educated: Patient Education method: Explanation Education comprehension: verbalized understanding  HOME EXERCISE PROGRAM:  TODAY's TX:  STW/M x 23 minutes to patient's left UT and Levator Scapulae with ischemic release technique utilized f/b HMP x 10 minutes.   ASSESSMENT:  CLINICAL IMPRESSION: Good response to last treatment with patient presenting to clinic with a lowered pain-level today.  She had increased tone over her left Levator Scapulae with very good response to STW/M.  OBJECTIVE IMPAIRMENTS: decreased ROM, increased muscle spasms, and pain.   ACTIVITY LIMITATIONS: sitting  PERSONAL FACTORS: 1 comorbidity: ACDF  are also affecting patient's functional outcome.   REHAB POTENTIAL: Good  CLINICAL DECISION MAKING:  Stable/uncomplicated  EVALUATION COMPLEXITY: Low   GOALS:  SHORT TERM GOALS: Target date: 10/11/22  Ind with an HEP.  Goal status: INITIAL   LONG TERM GOALS: Target date: 12/26/22.  Perform ADL's with pain not > 3/10.  Goal status:  INITIAL  2.  Read for one hour with pain not > 3/10.  Goal status: INITIAL   PLAN:  PT FREQUENCY: 1-2x/week  PT DURATION: 6 weeks  PLANNED INTERVENTIONS: Therapeutic exercises, Therapeutic activity, Patient/Family education, Self Care, Cryotherapy, Moist heat, and Manual therapy  PLAN FOR NEXT SESSION: STW/M, trigger point release, postural exercises.   Lenetta Piche, [place], PT 10/13/2022, 12:45 PM

## 2022-10-27 ENCOUNTER — Ambulatory Visit: Payer: Medicare HMO | Attending: Neurology

## 2022-10-27 DIAGNOSIS — M542 Cervicalgia: Secondary | ICD-10-CM | POA: Insufficient documentation

## 2022-10-27 DIAGNOSIS — M62838 Other muscle spasm: Secondary | ICD-10-CM | POA: Insufficient documentation

## 2022-10-27 NOTE — Therapy (Signed)
OUTPATIENT PHYSICAL THERAPY CERVICAL TREATMENT  Patient Name: Evelyn [name] MRN: [id number] DOB:12-18-1940, 82 y.o., female Today's Date: 10/27/2022  END OF SESSION:  PT End of Session - 10/27/22 1020     Visit Number 4    Number of Visits 8    Date for PT Re-Evaluation 12/27/22    PT Start Time 1015    PT Stop Time 1052    PT Time Calculation (min) 37 min    Activity Tolerance Patient tolerated treatment well    Behavior During Therapy [facility] for tasks assessed/performed             Past Medical History:  Diagnosis Date   Aneurysm (HCC)    Aortic insufficiency    Atrial fibrillation (HCC)    CAD (coronary artery disease)    CABG LIMA to LAD,vein graft to diagonal,vein graft to OM   Dyslipidemia    Essential hypertension 09/16/2013   GERD (gastroesophageal reflux disease)    Hypothyroid    Past Surgical History:  Procedure Laterality Date   ABDOMINAL HYSTERECTOMY     BACK SURGERY  2009   BACK SURGERY     CORONARY ANGIOPLASTY  2003   cutting balloon atherectomy in-stent stenosis of the LAD.   CORONARY ANGIOPLASTY WITH STENT PLACEMENT  2002   remote PCI & stenting LAD,   CORONARY ARTERY BYPASS GRAFT  2004   LIMA to LAD,vein graft to diagonal,vein graft to OM   FOOT SURGERY     KYPHOPLASTY N/A 11/24/2021   Procedure: LUMBAR ONE KYPHOPLASTY;  Surgeon: Barnett Abu, MD;  Location: [facility] OR;  Service: Neurosurgery;  Laterality: N/A;  LUMBAR ONE KYPHOPLASTY   LEFT HEART CATH AND CORS/GRAFTS ANGIOGRAPHY N/A 02/23/2017   Procedure: LEFT HEART CATH AND CORS/GRAFTS ANGIOGRAPHY;  Surgeon: Iran Ouch, MD;  Location: [facility] INVASIVE CV LAB;  Service: Cardiovascular;  Laterality: N/A;   LEFT HEART CATHETERIZATION WITH CORONARY ANGIOGRAM N/A 12/15/2011   Procedure: LEFT HEART CATHETERIZATION WITH CORONARY ANGIOGRAM;  Surgeon: Thurmon Fair, MD;  Location: [facility] CATH LAB;  Service: Cardiovascular;  Laterality: N/A;   LOOP RECORDER INSERTION N/A 06/14/2018   Procedure: LOOP  RECORDER INSERTION;  Surgeon: Thurmon Fair, MD;  Location: [facility] INVASIVE CV LAB;  Service: Cardiovascular;  Laterality: N/A;   NECK SURGERY     NM MYOCAR PERF WALL MOTION  12/01/2011   normal study-persistent exercise induced ECG changes raises the concern for ischemia (balanced ischemia).   NOSE SURGERY     PACEMAKER INSERTION     NOT PACEMAKER - LOOP RECORDER   TONSILLECTOMY  1948   TUBAL LIGATION     Patient Active Problem List   Diagnosis Date Noted   Intractable back pain 11/20/2021   Acquired thrombophilia (HCC) 11/20/2021   Hypokalemia 11/20/2021   Compression fracture of L1 vertebra (HCC) 11/20/2021   Paroxysmal atrial fibrillation (HCC) 12/18/2019   Cervical radiculopathy at C5 08/07/2019   Long term (current) use of anticoagulants 11/25/2018   DDD (degenerative disc disease), cervical 10/17/2018   Mixed hyperlipidemia 10/17/2018   Hemispheric carotid artery syndrome 06/14/2018   Pain management 08/01/2017   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 CABG X 3 2004 12/15/2011   Hypothyroidism 12/15/2011     REFERRING PROVIDER: Jacquelyne Balint MD  REFERRING DIAG: Cervicalgia.  THERAPY DIAG:  Cervicalgia  Other muscle spasm  Rationale for Evaluation and Treatment: Rehabilitation  ONSET DATE: "Several months".  SUBJECTIVE:                                                                                                                                                                                                          SUBJECTIVE STATEMENT: Pt reported 5/10 neck pain today.   PERTINENT HISTORY:  Loop recorded, TIA's, ACDF, lumbar kyphoplasty, OP, aortic aneurysm.  PAIN:  Are you having pain? Yes: NPRS scale: 5/10 Pain location: Left side of neck. Pain description: Shooting and sore. Aggravating factors: See above. Relieving factors: See above.  PRECAUTIONS: Other: Loop recorder.  WEIGHT BEARING RESTRICTIONS: No  FALLS:  Has patient fallen in last 6 months? No  LIVING ENVIRONMENT: Lives with: lives with their family Lives in: House/apartment Has following equipment at home: None  OCCUPATION: Retired.  PLOF: Independent  PATIENT GOALS: Not have neck pain.   OBJECTIVE:   POSTURE: No Significant postural limitations  PALPATION: Tender to palpation over left UT with notable trigger point and some tenderness over scalenes.   CERVICAL ROM:   Left active cervical rotation is 50 degrees and right is 52 degrees.  UPPER EXTREMITY ROM:  WNL.  UPPER EXTREMITY MMT:  Normal bilateral UE strength.  DTR's:  Normal UE DTR's.    PATIENT EDUCATION:  Education details: Discussed correct posture especially while reading. Person educated: Patient Education method: Explanation Education comprehension: verbalized understanding  HOME EXERCISE PROGRAM:  TODAY's TX:    Manual Therapy Soft Tissue Mobilization: right cervical paraspinals, STW/M to right upper traps, levator scap, and rightcervical paraspinals to decrease pain and tone    ASSESSMENT:  CLINICAL IMPRESSION: Pt arrives for today's treatment session reporting 5/10 neck pain.  MH utilized at beginning of treatment to relax neck musculature.  STW/M performed to right upper trap, levator scap, and cervical paraspinals to decrease pain and tone.  Pt reported good results with manual therapy with pain decreasing to 1/10 at completion of today's treatment session.   OBJECTIVE  IMPAIRMENTS: decreased ROM, increased muscle spasms, and pain.   ACTIVITY LIMITATIONS: sitting  PERSONAL FACTORS: 1 comorbidity: ACDF  are also affecting patient's functional outcome.   REHAB POTENTIAL: Good  CLINICAL DECISION MAKING: Stable/uncomplicated  EVALUATION COMPLEXITY: Low  GOALS:  SHORT TERM GOALS: Target date: 10/11/22  Ind with an HEP.  Goal status: INITIAL   LONG TERM GOALS: Target date: 12/26/22.  Perform ADL's with pain not > 3/10.  Goal status: INITIAL  2.  Read for one hour with pain not > 3/10.  Goal status: INITIAL   PLAN:  PT FREQUENCY: 1-2x/week  PT DURATION: 6 weeks  PLANNED INTERVENTIONS: Therapeutic exercises, Therapeutic activity, Patient/Family education, Self Care, Cryotherapy, Moist heat, and Manual therapy  PLAN FOR NEXT SESSION: STW/M, trigger point release, postural exercises.   Newman Pies, PTA 10/27/2022, 11:07 AM

## 2022-11-03 ENCOUNTER — Ambulatory Visit: Payer: Medicare HMO

## 2022-11-03 DIAGNOSIS — M542 Cervicalgia: Secondary | ICD-10-CM

## 2022-11-03 DIAGNOSIS — M62838 Other muscle spasm: Secondary | ICD-10-CM

## 2022-11-03 NOTE — Therapy (Signed)
OUTPATIENT PHYSICAL THERAPY CERVICAL TREATMENT  Patient Name: Evelyn [name] MRN: [id number] DOB:Apr 01, 1941, 82 y.o., female Today's Date: 11/03/2022  END OF SESSION:  PT End of Session - 11/03/22 1021     Visit Number 5    Number of Visits 8    Date for PT Re-Evaluation 12/27/22    PT Start Time 1015    PT Stop Time 1055    PT Time Calculation (min) 40 min    Activity Tolerance Patient tolerated treatment well    Behavior During Therapy [facility] for tasks assessed/performed             Past Medical History:  Diagnosis Date   Aneurysm (HCC)    Aortic insufficiency    Atrial fibrillation (HCC)    CAD (coronary artery disease)    CABG LIMA to LAD,vein graft to diagonal,vein graft to OM   Dyslipidemia    Essential hypertension 09/16/2013   GERD (gastroesophageal reflux disease)    Hypothyroid    Past Surgical History:  Procedure Laterality Date   ABDOMINAL HYSTERECTOMY     BACK SURGERY  2009   BACK SURGERY     CORONARY ANGIOPLASTY  2003   cutting balloon atherectomy in-stent stenosis of the LAD.   CORONARY ANGIOPLASTY WITH STENT PLACEMENT  2002   remote PCI & stenting LAD,   CORONARY ARTERY BYPASS GRAFT  2004   LIMA to LAD,vein graft to diagonal,vein graft to OM   FOOT SURGERY     KYPHOPLASTY N/A 11/24/2021   Procedure: LUMBAR ONE KYPHOPLASTY;  Surgeon: Barnett Abu, MD;  Location: [facility] OR;  Service: Neurosurgery;  Laterality: N/A;  LUMBAR ONE KYPHOPLASTY   LEFT HEART CATH AND CORS/GRAFTS ANGIOGRAPHY N/A 02/23/2017   Procedure: LEFT HEART CATH AND CORS/GRAFTS ANGIOGRAPHY;  Surgeon: Iran Ouch, MD;  Location: [facility] INV[facility];  Service: Cardiovascular;  Laterality: N/A;   LEFT HEART CATHETERIZATION WITH CORONARY ANGIOGRAM N/A 12/15/2011   Procedure: LEFT HEART CATHETERIZATION WITH CORONARY ANGIOGRAM;  Surgeon: Thurmon Fair, MD;  Location: [facility] CATH [facility];  Service: Cardiovascular;  Laterality: N/A;   LOOP RECORDER INSERTION N/A 06/14/2018   Procedure: LOOP  RECORDER INSERTION;  Surgeon: Thurmon Fair, MD;  Location: [facility] INVAS[facility] CV LAB;  Service: Cardiovascular;  Laterality: N/A;   NECK SURGERY     NM MYOCAR PERF WALL MOTION  12/01/2011   normal study-persistent exercise induced ECG changes raises the concern for ischemia (balanced ischemia).   NOSE SURGERY     PACEMAKER INSERTION     NOT PACEMAKER - LOOP RECORDER   TONSILLECTOMY  1948   TUBAL LIGATION     Patient Active Problem List   Diagnosis Date Noted   Intractable back pain 11/20/2021   Acquired thrombophilia (HCC) 11/20/2021   Hypokalemia 11/20/2021   Compression fracture of L1 vertebra (HCC) 11/20/2021   Paroxysmal atrial fibrillation (HCC) 12/18/2019   Cervical radiculopathy at C5 08/07/2019   Long term (current) use of anticoagulants 11/25/2018   DDD (degenerative disc disease), cervical 10/17/2018   Mixed hyperlipidemia 10/17/2018   Hemispheric carotid artery syndrome 06/14/2018   Pain management 08/01/2017   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 CABG X 3 2004 12/15/2011   Hypothyroidism 12/15/2011     REFERRING PROVIDER: Jacquelyne Balint MD  REFERRING DIAG: Cervicalgia.  THERAPY DIAG:  Cervicalgia  Other muscle spasm  Rationale for Evaluation and Treatment: Rehabilitation  ONSET DATE: "Several months".  SUBJECTIVE:                                                                                                                                                                                                          SUBJECTIVE STATEMENT: Pt reported 5/10 neck pain today.   PERTINENT HISTORY:  Loop recorded, TIA's, ACDF, lumbar kyphoplasty, OP, aortic aneurysm.  PAIN:  Are you having pain? Yes: NPRS scale: 5/10 Pain location: Left side of neck. Pain description: Shooting and sore. Aggravating factors: See above. Relieving factors: See above.  PRECAUTIONS: Other: Loop recorder.  WEIGHT BEARING RESTRICTIONS: No  FALLS:  Has patient fallen in last 6 months? No  LIVING ENVIRONMENT: Lives with: lives with their family Lives in: House/apartment Has following equipment at home: None  OCCUPATION: Retired.  PLOF: Independent  PATIENT GOALS: Not have neck pain.   OBJECTIVE:   POSTURE: No Significant postural limitations  PALPATION: Tender to palpation over left UT with notable trigger point and some tenderness over scalenes.   CERVICAL ROM:   Left active cervical rotation is 50 degrees and right is 52 degrees.  UPPER EXTREMITY ROM:  WNL.  UPPER EXTREMITY MMT:  Normal bilateral UE strength.  DTR's:  Normal UE DTR's.    PATIENT EDUCATION:  Education details: Discussed correct posture especially while reading. Person educated: Patient Education method: Explanation Education comprehension: verbalized understanding  HOME EXERCISE PROGRAM:  TODAY's TX:    Manual Therapy Soft Tissue Mobilization: right cervical paraspinals, STW/M to right upper traps, levator scap, and rightcervical paraspinals to decrease pain and tone    ASSESSMENT:  CLINICAL IMPRESSION: Pt arrives for today's treatment session reporting 5/10 right sided neck pain.  Pt reports that she felt much better after last treatment session, but her relief only lasted about an hour.  Pt with continued good results with STW and MH usage.  Unable to use other modalities due to other medical issues.  Pt reported 2/10 neck pain at completion of today's treatment  session.   OBJECTIVE IMPAIRMENTS: decreased ROM, increased muscle spasms, and pain.   ACTIVITY LIMITATIONS: sitting  PERSONAL FACTORS: 1 comorbidity: ACDF  are also affecting patient's functional outcome.   REHAB POTENTIAL: Good  CLINICAL DECISION MAKING: Stable/uncomplicated  EVALUATION COMPLEXITY: Low   GOALS:  SHORT TERM GOALS: Target date: 10/11/22  Ind with an HEP.  Goal status: IN PROGRESS   LONG TERM GOALS: Target date: 12/26/22.  Perform ADL's with pain not > 3/10.  Goal status: IN PROGRESS  2.  Read for one hour with pain not > 3/10.  Goal status: IN PROGRESS   PLAN:  PT FREQUENCY: 1-2x/week  PT DURATION: 6 weeks  PLANNED INTERVENTIONS: Therapeutic exercises, Therapeutic activity, Patient/Family education, Self Care, Cryotherapy, Moist heat, and Manual therapy  PLAN FOR NEXT SESSION: STW/M, trigger point release, postural exercises.   Newman Pies, PTA 11/03/2022, 11:12 AM

## 2022-11-10 ENCOUNTER — Encounter: Payer: Medicare HMO | Admitting: Physical Therapy

## 2022-11-22 ENCOUNTER — Encounter: Payer: Self-pay | Admitting: Nurse Practitioner

## 2022-11-22 ENCOUNTER — Ambulatory Visit: Payer: Medicare HMO | Admitting: Nurse Practitioner

## 2022-11-22 VITALS — BP 106/69 | HR 50 | Temp 97.4°F | Resp 20 | Ht 62.0 in | Wt 112.0 lb

## 2022-11-22 DIAGNOSIS — M5412 Radiculopathy, cervical region: Secondary | ICD-10-CM | POA: Diagnosis not present

## 2022-11-22 MED ORDER — HYDROCODONE-ACETAMINOPHEN 5-325 MG PO TABS: 1.0000 | ORAL_TABLET | Freq: Four times a day (QID) | ORAL | 0 refills | Status: AC | PRN

## 2022-11-22 MED ORDER — HYDROCODONE-ACETAMINOPHEN 5-325 MG PO TABS: 1.0000 | ORAL_TABLET | Freq: Two times a day (BID) | ORAL | 0 refills | Status: AC

## 2022-11-22 MED ORDER — HYDROCODONE-ACETAMINOPHEN 5-325 MG PO TABS: 1.0000 | ORAL_TABLET | Freq: Four times a day (QID) | ORAL | 0 refills | Status: DC | PRN

## 2022-11-22 NOTE — Progress Notes (Signed)
Subjective:    Patient ID: Evelyn [name], female    DOB: Jul 02, 1940, 82 y.o.   MRN: [id number]   Chief Complaint: pain management  HPI Cervical and back pain Pain assessment: Cause of pain- spondylosis Pain location- neck and  lower back Pain on scale of 1-10- 5/10 Frequency- daily What increases pain-nothing What makes pain Better-rest helps Effects on ADL - none Any change in general medical condition-no  Current opioids rx- norco 5/325 bid # meds rx- 60 Effectiveness of current meds-help Adverse reactions from pain meds-none Morphine equivalent- 20 mme  Pill count performed-No Last drug screen - 08/23/22 ( high risk q108m, moderate risk q21m, low risk yearly ) Urine drug screen today- No Was the [facility] reviewed- yes  If yes were their any concerning findings? 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 CABG X 3 2004 12/15/2011   Hypothyroidism 12/15/2011       Review of Systems  Constitutional:  Negative for diaphoresis.  Eyes:  Negative for pain.  Respiratory:  Negative for shortness of breath.   Cardiovascular:  Negative for chest pain, palpitations and leg swelling.  Gastrointestinal:  Negative for abdominal pain.  Endocrine: Negative for polydipsia.  Skin:  Negative for rash.  Neurological:  Negative for dizziness, weakness and headaches.  Hematological:  Does not bruise/bleed easily.  All other systems reviewed and are negative.      Objective:   Physical Exam Vitals and nursing note reviewed.  Constitutional:      General: She is not in acute distress.    Appearance: Normal appearance. She is well-developed.  Neck:     Vascular: No carotid bruit or JVD.  Cardiovascular:     Rate and Rhythm: Normal rate and regular rhythm.     Heart sounds: Normal heart sounds.  Pulmonary:     Effort: Pulmonary effort is normal. No respiratory distress.     Breath sounds: Normal breath sounds. No wheezing or rales.  Chest:     Chest wall: No tenderness.  Abdominal:     General: There is no distension or abdominal bruit.     Palpations: There is no hepatomegaly, splenomegaly, mass or pulsatile mass.     Tenderness: There is no abdominal tenderness.  Musculoskeletal:     Cervical back: Normal range of motion and neck supple.     Comments: Limited ROM of lumbar spine a neck due to pain with movement Grips equal bil Motor strength and  sensation lower ext equal bil.  Lymphadenopathy:     Cervical: No cervical adenopathy.  Skin:    General: Skin is warm and dry.  Neurological:     Mental Status: She is alert and oriented to person, place, and time.     Deep Tendon Reflexes: Reflexes are normal and symmetric.  Psychiatric:        Behavior: Behavior normal.        Thought Content: Thought content normal.        Judgment: Judgment normal.    BP 106/69    Pulse (!) 50   Temp (!) 97.4 F (36.3 C) (Temporal)   Resp 20   Ht 5\' 2"  (1.575 m)   Wt 112 lb (50.8 kg)   SpO2 95%   BMI 20.49 kg/m         Assessment & Plan:   Evelyn [name] in today with chief complaint of Pain Management   1. Cervical radiculopathy at C5 Pain management  Meds ordered this encounter  Medications   HYDROcodone-acetaminophen (NORCO) 5-325 MG tablet    Sig: Take 1 tablet by mouth every 6 (six) hours as needed for moderate pain.    Dispense:  30 tablet    Refill:  0    Order Specific Question:   Supervising Provider    Answer:   Arville Care A [[id number]]   HYDROcodone-acetaminophen (NORCO) 5-325 MG tablet    Sig: Take 1 tablet by mouth every 6 (six) hours as needed for moderate pain.    Dispense:  60 tablet    Refill:  0    Order Specific Question:   Supervising Provider    Answer:   Arville Care A [[id number]]   HYDROcodone-acetaminophen (NORCO) 5-325 MG tablet    Sig: Take 1 tablet by mouth 2 (two) times daily.    Dispense:  60 tablet    Refill:  0    Order Specific Question:   Supervising Provider    Answer:   Arville Care A [[id number]]      The above assessment and management plan was discussed with the patient. The patient verbalized understanding of and has agreed to the management plan. Patient is aware to call the clinic if symptoms persist or worsen. Patient is aware when to return to the clinic for a follow-up visit. Patient educated on when it is appropriate to go to the emergency department.   [name], FNP

## 2022-11-22 NOTE — Patient Instructions (Signed)
Opioid Pain Medicine Management Opioid pain medicines are strong medicines that are used to treat bad or very bad pain. When you take them for a short time, they can help you: Sleep better. Do better in physical therapy. Feel better during the first few days after you get hurt. Recover from surgery. Only take these medicines if a doctor says that you can. You should only take them for a short time. This is because opioids can be very addictive. This means that they are hard to stop taking. The longer you take opioids, the harder it may be to stop taking them. What are the risks? Opioids can cause problems (side effects). Taking them for more than 3 days raises your chance of problems, such as: Trouble pooping (constipation). Feeling sick to your stomach (nausea). Vomiting. Feeling very sleepy. Confusion. Not being able to stop taking the medicine. Breathing problems. Taking opioids for a long time can make it hard for you to do daily tasks. It can also put you at risk for: Car accidents. Depression. Suicide. Heart attack. Taking too much of the medicine (overdose). This can lead to death. What is a pain treatment plan? A pain treatment plan is a plan made by you and your doctor. Work with your doctor to make a plan for treating your pain. To help you do this: Talk about the goals of your treatment, including: How much pain you might expect to have. How you will manage the pain. Talk about the risks and benefits of taking these medicines for your condition. Remember that a good treatment plan uses more than one approach and lowers the risks of side effects. Tell your doctor about the amount of medicines you take and about any drug or alcohol use. Get your pain medicine prescriptions from only one doctor. Pain can be managed with other treatments. Work with your doctor to find other ways to help your pain, such as: Physical therapy or doing gentle exercises. Counseling. Eating healthy  foods. Massage. Meditation. Other pain medicines. How to use opioid pain medicine safely Taking medicine Take your pain medicine exactly as told by your doctor. Take it only when you need it. If your pain is not too bad, you may take less medicine if your doctor allows. If you have no pain, do not take the medicine unless your doctor tells you to take it. If your pain is very bad, do not take more medicine than your doctor told you to take. Call your doctor to know what to do. Write down the times when you take your pain medicine. Look at the times before you take your next dose. Take other over-the-counter or prescription medicines only as told by your doctor. Keeping yourself and others safe  While you are taking opioids: Do not drive, use machines, or power tools. Do not sign important papers (legal documents). Do not drink alcohol. Do not take sleeping pills. Do not take care of children by yourself. Do not do activities where you need to climb or be in high places, like working on a ladder. Do not go to a lake, river, ocean, swimming pool, or hot tub. Keep your opioids locked up or in a place where children cannot reach them. Do not share your pain medicine with anyone. Stopping your use of opioids If you have been taking opioids for more than a few weeks, you may need to slowly decrease (taper) how much you take until you stop taking them. Doing this can lower your chance   of having symptoms.  Symptoms that come from suddenly stopping the use of opioids include: Pain and cramping in your belly (abdomen). Feeling sick to your stomach (nausea).z Sweating. Feeling very sleepy. Feeling restless. Shaking you cannot control (tremors). Cravings for the medicine. Do not try to stop taking them by yourself. Work with your doctor to stop. Your doctor will help you take less until you are not taking the medicine at all. Getting rid of unused pills Do not save any pills that you did not  use. Get rid of the pills by: Taking them to a take-back program in your area. Bringing them to a pharmacy that receives unused pills. Flushing them down the toilet. Check the label or package insert of your medicine to see whether this is safe to do. Throwing them in the trash. Check the label or package insert of your medicine to see whether this is safe to do. If it is safe to throw them out: Take the pills out of their container. Put the pills into a container you can seal. Mix the pills with used coffee grounds, food scraps, dirt, or cat litter. Put this in the trash. Follow these instructions at home: Activity Do exercises as told by your doctor. Avoid doing things that make your pain worse. Return to your normal activities as told by your doctor. Ask your doctor what activities are safe for you. General instructions You may need to take these actions to prevent or treat constipation: Drink enough fluid to keep your pee (urine) pale yellow. Take over-the-counter or prescription medicines. Eat foods that are high in fiber. These include beans, whole grains, and fresh fruits and vegetables. Limit foods that are high in fat and sugar. These include fried or sweet foods. Keep all follow-up visits. Where to find support If you have been taking opioids for a long time, get help from a local support group or counselor. Ask your doctor about this. Where to find more information Centers for Disease Control and Prevention (CDC): www.cdc.gov [place] Food and Drug Administration (FDA): www.fda.gov Get help right away if: You may have taken too much of an opioid (overdosed). Common symptoms of an overdose: Your breathing is slower or more shallow than normal. You have a very slow heartbeat. Your speech is not normal. You vomit or you feel as if you may vomit. The black centers of your eyes (pupils) are smaller than normal. You have other potential symptoms: You feel very confused. You  faint. You are very sleepy. You have cold skin. You have blue lips or fingernails. You have thoughts of harming yourself or harming others. These symptoms may be an emergency. Get help right away. Call your local emergency services (911 in the [place] Do not wait to see if the symptoms will go away. Do not drive yourself to the hospital. Get help right away if you feel like you may hurt yourself or others, or have thoughts about taking your own life. Go to your nearest emergency room or: Call your local emergency services (911 in the [place] Call the National Poison Control Center at 1-800-222-1222. Call a suicide crisis helpline, such as the National Suicide Prevention Lifeline at 1-800-273-8255 or 988 in the [place] This is open 24 hours a day. Text the Crisis Text Line at 741741. Summary Opioid are strong medicines that are used to treat bad or very bad pain. A pain treatment plan is a plan made by you and your doctor. Work with your doctor to make a   plan for treating your pain. If you think that you or someone else may have taken too much of an opioid, get help right away. This information is not intended to replace advice given to you by your health care provider. Make sure you discuss any questions you have with your health care provider. Document Revised: 11/26/2020 Document Reviewed: 08/13/2020 Elsevier Patient Education  2024 Elsevier Inc.  

## 2022-11-23 ENCOUNTER — Other Ambulatory Visit: Payer: Medicare HMO

## 2022-11-24 ENCOUNTER — Other Ambulatory Visit: Payer: Medicare HMO

## 2022-11-24 LAB — LIPID PANEL
Chol/HDL Ratio: 2 ratio (ref 0.0–4.4)
Cholesterol, Total: 116 mg/dL (ref 100–199)
HDL: 58 mg/dL (ref >39)
LDL Chol Calc (NIH): 44 mg/dL (ref 0–99)
Triglycerides: 68 mg/dL (ref 0–149)
VLDL Cholesterol Cal: 14 mg/dL (ref 5–40)

## 2022-11-24 LAB — T4, FREE: Free T4: 2.16 ng/dL — ABNORMAL HIGH (ref 0.82–1.77)

## 2022-11-24 LAB — TSH: TSH: 0.066 u[IU]/mL — ABNORMAL LOW (ref 0.450–4.500)

## 2022-11-25 ENCOUNTER — Encounter: Payer: Self-pay | Admitting: "Endocrinology

## 2022-11-25 ENCOUNTER — Ambulatory Visit: Payer: Medicare HMO | Admitting: "Endocrinology

## 2022-11-25 ENCOUNTER — Telehealth: Payer: Self-pay | Admitting: Nurse Practitioner

## 2022-11-25 VITALS — BP 80/48 | HR 84 | Ht 62.0 in | Wt 112.2 lb

## 2022-11-25 DIAGNOSIS — E782 Mixed hyperlipidemia: Secondary | ICD-10-CM

## 2022-11-25 DIAGNOSIS — I959 Hypotension, unspecified: Secondary | ICD-10-CM | POA: Insufficient documentation

## 2022-11-25 DIAGNOSIS — I480 Paroxysmal atrial fibrillation: Secondary | ICD-10-CM

## 2022-11-25 DIAGNOSIS — E039 Hypothyroidism, unspecified: Secondary | ICD-10-CM | POA: Diagnosis not present

## 2022-11-25 DIAGNOSIS — E042 Nontoxic multinodular goiter: Secondary | ICD-10-CM

## 2022-11-25 MED ORDER — LEVOTHYROXINE SODIUM 75 MCG PO TABS: 75.0000 ug | ORAL_TABLET | Freq: Every day | ORAL | 1 refills | Status: DC

## 2022-11-25 MED ORDER — METOPROLOL TARTRATE 50 MG PO TABS: 50.0000 mg | ORAL_TABLET | Freq: Two times a day (BID) | ORAL | 1 refills | Status: DC

## 2022-11-25 NOTE — Progress Notes (Signed)
11/25/2022      Endocrinology follow-up note   Subjective:    Patient ID: Evelyn [name], female    DOB: 08-21-1940, PCP Bennie Pierini, FNP   Past Medical History:  Diagnosis Date   Aneurysm [facility])    Aortic insufficiency    Atrial fibrillation (HCC)    CAD (coronary artery disease)    CABG LIMA to LAD,vein graft to diagonal,vein graft to OM   Dyslipidemia    Essential hypertension 09/16/2013   GERD (gastroesophageal reflux disease)    Hypothyroid    Past Surgical History:  Procedure Laterality Date   ABDOMINAL HYSTERECTOMY     BACK SURGERY  2009   BACK SURGERY     CORONARY ANGIOPLASTY  2003   cutting balloon atherectomy in-stent stenosis of the LAD.   CORONARY ANGIOPLASTY WITH STENT PLACEMENT  2002   remote PCI & stenting LAD,   CORONARY ARTERY BYPASS GRAFT  2004   LIMA to LAD,vein graft to diagonal,vein graft to OM   FOOT SURGERY     KYPHOPLASTY N/A 11/24/2021   Procedure: LUMBAR ONE KYPHOPLASTY;  Surgeon: Barnett Abu, MD;  Location: [facility] OR;  Service: Neurosurgery;  Laterality: N/A;  LUMBAR ONE KYPHOPLASTY   LEFT HEART CATH AND CORS/GRAFTS ANGIOGRAPHY N/A 02/23/2017   Procedure: LEFT HEART CATH AND CORS/GRAFTS ANGIOGRAPHY;  Surgeon: Iran Ouch, MD;  Location: [facility] INVASIVE CV LAB;  Service: Cardiovascular;  Laterality: N/A;   LEFT HEART CATHETERIZATION WITH CORONARY ANGIOGRAM N/A 12/15/2011   Procedure: LEFT HEART CATHETERIZATION WITH CORONARY ANGIOGRAM;  Surgeon: Thurmon Fair, MD;  Location: [facility] CATH LAB;  Service: Cardiovascular;  Laterality: N/A;   LOOP RECORDER INSERTION N/A 06/14/2018   Procedure: LOOP RECORDER INSERTION;  Surgeon: Thurmon Fair, MD;  Location: [facility] INVASIVE CV LAB;  Service: Cardiovascular;  Laterality: N/A;   NECK SURGERY     NM MYOCAR PERF WALL MOTION  12/01/2011   normal study-persistent exercise induced ECG changes raises the concern for ischemia (balanced ischemia).   NOSE SURGERY     PACEMAKER INSERTION     NOT PACEMAKER  - LOOP RECORDER   TONSILLECTOMY  1948   TUBAL LIGATION     Social History   Socioeconomic History   Marital status: Widowed    Spouse name: Not on file   Number of children: 2   Years of education: Associates   Highest education level: Associate degree: academic program  Occupational History   Occupation: Retired  Tobacco Use   Smoking status: Never   Smokeless tobacco: Never  Vaping Use   Vaping status: Never Used  Substance and Sexual Activity   Alcohol use: No   Drug use: No   Sexual activity: Not Currently  Other Topics Concern   Not on file  Social History Narrative   Mother of 2 with 4 grandchildren   Right-handed   Caffeine: 8-10 cups coffee   Pacer    Are you right handed or left handed? right   Are you currently employed ?    What is your current occupation? retired   Do you live at home alone? With son   Who lives with you?    What type of home do you live in: 1 story or 2 story? one       Social Determinants of Health   Financial Resource Strain: Low Risk  (06/21/2022)   Overall Financial Resource Strain (CARDIA)    Difficulty of Paying Living Expenses: Not hard at all  Food Insecurity: No Food Insecurity (  06/21/2022)   Hunger Vital Sign    Worried About Running Out of Food in the Last Year: Never true    Ran Out of Food in the Last Year: Never true  Transportation Needs: No Transportation Needs (06/21/2022)   PRAPARE - Administrator, Civil Service (Medical): No    Lack of Transportation (Non-Medical): No  Physical Activity: Insufficiently Active (06/21/2022)   Exercise Vital Sign    Days of Exercise per Week: 3 days    Minutes of Exercise per Session: 30 min  Stress: No Stress Concern Present (06/21/2022)   [employer] of Occupational Health - Occupational Stress Questionnaire    Feeling of Stress : Not at all  Social Connections: Moderately Integrated (06/21/2022)   Social Connection and Isolation Panel [NHANES]    Frequency of  Communication with Friends and Family: More than three times a week    Frequency of Social Gatherings with Friends and Family: More than three times a week    Attends Religious Services: 1 to 4 times per year    Active Member of [employer] or Organizations: Yes    Attends Banker Meetings: More than 4 times per year    Marital Status: Widowed   Outpatient Encounter Medications as of 11/25/2022  Medication Sig   apixaban (ELIQUIS) 2.5 MG TABS tablet Take 1 tablet (2.5 mg total) by mouth 2 (two) times daily.   [facility] D3 125 MCG (5000 UT) capsule TAKE 1 CAPSULE BY MOUTH EVERY DAY   diltiazem (CARDIZEM) 30 MG tablet Take 1 tablet (30 mg total) by mouth every 6 (six) hours as needed (for rapid heart rate).   furosemide (LASIX) 20 MG tablet Take 1 tablet (20 mg total) by mouth daily.   [START ON 01/21/2023] HYDROcodone-acetaminophen (NORCO) 5-325 MG tablet Take 1 tablet by mouth every 6 (six) hours as needed for moderate pain.   [START ON 12/22/2022] HYDROcodone-acetaminophen (NORCO) 5-325 MG tablet Take 1 tablet by mouth every 6 (six) hours as needed for moderate pain.   HYDROcodone-acetaminophen (NORCO) 5-325 MG tablet Take 1 tablet by mouth 2 (two) times daily.   hydrocortisone 2.5 % cream APPLY TO AFFECTED AREA TWICE A DAY (Patient taking differently: Apply 1 Application topically daily as needed (itching).)   isosorbide mononitrate (IMDUR) 60 MG 24 hr tablet TAKE 1 TABLET (60 MG TOTAL) DAILY BY MOUTH.   levothyroxine (SYNTHROID) 75 MCG tablet Take 1 tablet (75 mcg total) by mouth daily before breakfast.   loratadine (CLARITIN) 10 MG tablet TAKE 1 TABLET BY MOUTH EVERY DAY   methocarbamol (ROBAXIN) 500 MG tablet TAKE 1 TABLET (500 MG TOTAL) BY MOUTH EVERY 12 (TWELVE) HOURS AS NEEDED FOR MUSCLE SPASMS.   metoprolol tartrate (LOPRESSOR) 50 MG tablet Take 1 tablet (50 mg total) by mouth 2 (two) times daily.   nitroGLYCERIN (NITROSTAT) 0.4 MG SL tablet PLACE 1 TABLET UNDER THE TONGUE EVERY 5 MINUTES  AS NEEDED FOR CHEST PAIN.   polyethylene glycol (MIRALAX / GLYCOLAX) 17 g packet Take 17 g by mouth daily as needed for mild constipation.   potassium chloride (KLOR-CON) 10 MEQ tablet Take 1 tablet (10 mEq total) by mouth daily.   rosuvastatin (CRESTOR) 20 MG tablet Take 1 tablet (20 mg total) by mouth daily.   senna (SENOKOT) 8.6 MG TABS tablet Take 1 tablet (8.6 mg total) by mouth 2 (two) times daily.   Zoledronic Acid (RECLAST IV) Inject into the vein. Once yearly   [DISCONTINUED] levothyroxine (SYNTHROID) 88 MCG tablet Take  1 tablet (88 mcg total) by mouth daily before breakfast.   [DISCONTINUED] metoprolol tartrate (LOPRESSOR) 100 MG tablet Take 1 tablet (100 mg total) by mouth 2 (two) times daily.   No facility-administered encounter medications on file as of 11/25/2022.   ALLERGIES: Allergies  Allergen Reactions   Sulfa Antibiotics Swelling   VACCINATION STATUS: Immunization History  Administered Date(s) Administered   Fluad Quad(high Dose 65+) 02/29/2020, 03/27/2021, 05/11/2022   Influenza, High Dose Seasonal PF 03/09/2016, 03/02/2017   Moderna Sars-Covid-2 Vaccination 01/02/2020, 01/30/2020   Pneumococcal Conjugate-13 01/11/2018    HPI  82 year old female with medical history as above. She is being seen in follow-up for management of her hypothyroidism and multinodular goiter.  She remains on levothyroxine 88 mcg p.o. daily before breakfast.   She is compliant and has no side effects.  Her previsit thyroid function tests are consistent with significant over replacement.    - She is status post FNA 3 nodules in her thyroid benign findings.  Her previsit surveillance thyroid/neck ultrasound shows stable findings with no concerning nodules at this time.    -She was recently diagnosed with acute coronary syndrome on medical management. -Her previsit labs are consistent with appropriate replacement.  She has hyperlipidemia currently on Crestor 20 mg p.o. nightly. She was found to  have hypotension with blood pressure measuring 80/48.  Patient has been asymptomatic.  She is taking Lasix, metoprolol 100 mg p.o. 2 times daily.  - She denies family history of thyroid dysfunction. She feels occasional choking sensation in her neck, no recent voice change nor shortness of breath. She denies any exposure to neck radiation. - She denies any history of smoking.  Review of Systems Limited as above.  Objective:    BP (!) 80/48   Pulse 84   Ht 5\' 2"  (1.575 m)   Wt 112 lb 3.2 oz (50.9 kg)   BMI 20.52 kg/m   Wt Readings from Last 3 Encounters:  11/25/22 112 lb 3.2 oz (50.9 kg)  11/22/22 112 lb (50.8 kg)  09/09/22 113 lb (51.3 kg)    Physical Exam   CMP     Component Value Date/Time   NA 138 08/22/2022 1545   NA 141 01/01/2022 1231   K 3.7 08/22/2022 1545   CL 105 08/22/2022 1545   CO2 23 08/22/2022 1545   GLUCOSE 98 08/22/2022 1545   BUN 15 08/22/2022 1545   BUN 10 01/01/2022 1231   CREATININE 0.61 08/22/2022 1545   CREATININE 0.59 07/11/2014 0913   CALCIUM 9.1 08/22/2022 1545   PROT 7.0 01/01/2022 1231   ALBUMIN 4.5 01/01/2022 1231   AST 16 01/01/2022 1231   ALT 9 01/01/2022 1231   ALKPHOS 60 01/01/2022 1231   BILITOT 0.4 01/01/2022 1231   GFRNONAA >60 08/22/2022 1545   GFRAA 98 06/26/2020 1116   Recent Results (from the past 2160 hour(s))  Vitamin B1     Status: None   Collection Time: 09/09/22 10:46 AM  Result Value Ref Range   Vitamin B1 (Thiamine) 11 8 - 30 nmol/L    Comment: (Note) Vitamin supplementation within 24 hours prior to blood  draw may affect the accuracy of the results. . This test was developed and its analytical performance  characteristics have been determined by [employer]. It has not been cleared or approved by FDA.  This assay has been validated pursuant to the CLIA  regulations and is used for clinical purposes. . [facility] [address] [facility] [address] [address]  [place] [address] [phone] Junita Push L. Thompson Caul, MD, PhD   Vitamin B12     Status: Abnormal   Collection Time: 09/09/22 10:46 AM  Result Value Ref Range   Vitamin B-12 121 (L) 211 - 911 pg/mL  Lipid panel     Status: None   Collection Time: 11/23/22 10:01 AM  Result Value Ref Range   Cholesterol, Total 116 100 - 199 mg/dL   Triglycerides 68 0 - 149 mg/dL   HDL 58 >91 mg/dL   VLDL Cholesterol Cal 14 5 - 40 mg/dL   LDL Chol Calc (NIH) 44 0 - 99 mg/dL   Chol/HDL Ratio 2.0 0.0 - 4.4 ratio    Comment:                                   T. Chol/HDL Ratio                                             Men  Women                               1/2 Avg.Risk  3.4    3.3                                   Avg.Risk  5.0    4.4                                2X Avg.Risk  9.6    7.1                                3X Avg.Risk 23.4   11.0   TSH     Status: Abnormal   Collection Time: 11/23/22 10:01 AM  Result Value Ref Range   TSH 0.066 (L) 0.450 - 4.500 uIU/mL  T4, free     Status: Abnormal   Collection Time: 11/23/22 10:01 AM  Result Value Ref Range   Free T4 2.16 (H) 0.82 - 1.77 ng/dL   Fine-needle aspiration samples  of 3 nodules in her thyroid is benign  Repeat thyroid ultrasound on April 18, 2019: Right lobe 4 cm x 1.9 mm x 2.6 cm, left lobe 4.1 cm x 1.5 cm x 2.3 cm  There are small stable nodules 3 on the right and one on the left.  2 right-sided nodules and one nodule on the left were previously biopsied with benign findings.   Assessment & Plan:   1.  hypothyroidism 2. Multinodular goiter 3.  Hyperlipidemia 4.  Hypotension  -Her previsit thyroid function tests are consistent with over replacement.  I discussed and lowered her levothyroxine to 75 mcg p.o. daily before breakfast.    - We discussed about the correct intake of her thyroid hormone, on empty stomach at fasting, with water, separated by at least 30 minutes from breakfast and other medications,  and separated by more than 4 hours from calcium, iron,  multivitamins, acid reflux medications (PPIs). -Patient is made aware of the fact that thyroid hormone replacement is needed  for life, dose to be adjusted by periodic monitoring of thyroid function tests.  -  Prior Biopsy off  3 nodules is negative for malignancy.  She had stable findings on her surveillance thyroid ultrasound-right thyroid nodule has decreased in size with interval cystic changes which downgrades it from TR 4 to TR 2 category.  She will not need intervention at this time.   She is advised to continue Crestor 20 mg p.o. nightly. Regarding her asymptomatic hypotension, she is advised to hold her Lasix for now.  I lowered her metoprolol to 50 mg p.o. twice daily.  I also advised her to see her PCP this week for evaluation.  Given her history of paroxysmal atrial fibrillation, she may need assessment with EKG.    -Patient takes opioids for pain syndrome, at risk for adrenal insufficiency.  She will have a.m. cortisol along with her thyroid function test before her next visit in 5 weeks.  - I advised patient to maintain close follow up with Bennie Pierini, FNP for primary care needs.   I spent  25  minutes in the care of the patient today including review of labs from Thyroid Function, CMP, and other relevant labs ; imaging/biopsy records (current and previous including abstractions from other facilities); face-to-face time discussing  her lab results and symptoms, medications doses, her options of short and long term treatment based on the latest standards of care / guidelines;   and documenting the encounter.  Evelyn [name]  participated in the discussions, expressed understanding, and voiced agreement with the above plans.  All questions were answered to her satisfaction. she is encouraged to contact clinic should she have any questions or concerns prior to her return visit.    Follow up plan: Return in about 5 weeks (around 12/30/2022) for Fasting Labs  in AM B4  8.  Marquis Lunch, MD Phone: [phone]  Fax: [phone]  -  This note was partially dictated with voice recognition software. Similar sounding words can be transcribed inadequately or may not  be corrected upon review.  11/25/2022, 3:54 PM

## 2022-11-25 NOTE — Telephone Encounter (Signed)
Appt scheduled for tomorrow with PCP. No symptoms of any kind.

## 2022-11-26 ENCOUNTER — Ambulatory Visit: Payer: Medicare HMO | Admitting: Nurse Practitioner

## 2022-11-26 ENCOUNTER — Encounter: Payer: Self-pay | Admitting: Nurse Practitioner

## 2022-11-26 VITALS — BP 120/67 | HR 59 | Temp 98.0°F | Resp 20 | Ht 62.0 in | Wt 113.0 lb

## 2022-11-26 DIAGNOSIS — I959 Hypotension, unspecified: Secondary | ICD-10-CM

## 2022-11-26 DIAGNOSIS — R079 Chest pain, unspecified: Secondary | ICD-10-CM

## 2022-11-26 NOTE — Progress Notes (Signed)
Subjective:    Patient ID: Evelyn [name], female    DOB: Aug 13, 1940, 82 y.o.   MRN: [id number]   Chief Complaint: needs EKG  HPI  Patient went to see DR. Nida on 11/25/22 no follow u of hypothyroidism. While there her blood pressure was noted to be 80/48. Due to her recent dx of acute coronary syndrome, he wanted her to have an EKG. Patient says she is a little fatigued which is not unusual for her. Denies syncopal or near syncopal events. She was  currently on imdur, cardiozem. lasix and metoprolol that could affect her blood pressure. Dr. Fransico Him stopped her lasix and cut her metoprolol in half. Says she had chest pan yesterday and had to take nitroglycrein BP Readings from Last 3 Encounters:  11/25/22 (!) 80/48  11/22/22 106/69  09/09/22 (!) 140/72    Patient Active Problem List   Diagnosis Date Noted   Hypotension 11/25/2022   Intractable back pain 11/20/2021   Acquired thrombophilia (HCC) 11/20/2021   Hypokalemia 11/20/2021   Compression fracture of L1 vertebra (HCC) 11/20/2021   Paroxysmal atrial fibrillation (HCC) 12/18/2019   Cervical radiculopathy at C5 08/07/2019   Long term (current) use of anticoagulants 11/25/2018   DDD (degenerative disc disease), cervical 10/17/2018   Mixed hyperlipidemia 10/17/2018   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 CABG X 3 2004 12/15/2011   Hypothyroidism  12/15/2011        Review of Systems  Constitutional:  Negative for diaphoresis.  Eyes:  Negative for pain.  Respiratory:  Negative for shortness of breath.   Cardiovascular:  Negative for chest pain, palpitations and leg swelling.  Gastrointestinal:  Negative for abdominal pain.  Endocrine: Negative for polydipsia.  Skin:  Negative for rash.  Neurological:  Negative for dizziness, weakness and headaches.  Hematological:  Does not bruise/bleed easily.  All other systems reviewed and are negative.      Objective:   Physical Exam Vitals reviewed.  Constitutional:      Appearance: Normal appearance.  Cardiovascular:     Rate and Rhythm: Normal rate and regular rhythm.     Heart sounds: Normal heart sounds.  Pulmonary:     Effort: Pulmonary effort is normal.     Breath sounds: Normal breath sounds.  Skin:    General: Skin is warm.  Neurological:     General: No focal deficit present.     Mental Status: She is alert and oriented to person, place, and time.  Psychiatric:        Mood and Affect: Mood normal.        Behavior: Behavior normal.       BP 120/67   Pulse (!) 59   Temp 98 F (36.7 C) (Temporal)   Resp 20   Ht 5\' 2"  (1.575 m)   Wt 113 lb (51.3 kg)   SpO2 95%   BMI 20.67 kg/m  EKG- sinus bradycardia     Assessment & Plan:  Evelyn [name] in today with chief complaint of low blood pressure and Chest Pain   1. Chest pain, unspecified type Needs to follow up with cardiologist - EKG 12-Lead  2. Hypotension, unspecified hypotension type Stay on current meds Keep check of blood pressure    The above assessment and management plan was discussed with the patient. The patient verbalized understanding of and has agreed to the management plan. Patient is aware to call the clinic if symptoms persist or worsen. Patient is aware when to return to the clinic for a follow-up visit. Patient educated on when it is appropriate to go to the emergency  department.   [name], FNP

## 2022-11-26 NOTE — Patient Instructions (Signed)
Chest Wall Pain Chest wall pain is pain in or around the bones and muscles of your chest. Sometimes, an injury causes this pain. Excessive coughing or overuse of arm and chest muscles may also cause chest wall pain. Sometimes, the cause may not be known. This pain may take several weeks or longer to get better. Follow these instructions at home: Managing pain, stiffness, and swelling  If directed, put ice on the painful area: Put ice in a plastic bag. Place a towel between your skin and the bag. Leave the ice on for 20 minutes, 2-3 times per day. Activity Rest as told by your health care provider. Avoid activities that cause pain. These include any activities that use your chest muscles or your abdominal and side muscles to lift heavy items. Ask your health care provider what activities are safe for you. General instructions  Take over-the-counter and prescription medicines only as told by your health care provider. Do not use any products that contain nicotine or tobacco, such as cigarettes, e-cigarettes, and chewing tobacco. These can delay healing after injury. If you need help quitting, ask your health care provider. Keep all follow-up visits as told by your health care provider. This is important. Contact a health care provider if: You have a fever. Your chest pain becomes worse. You have new symptoms. Get help right away if: You have nausea or vomiting. You feel sweaty or light-headed. You have a cough with mucus from your lungs (sputum) or you cough up blood. You develop shortness of breath. These symptoms may represent a serious problem that is an emergency. Do not wait to see if the symptoms will go away. Get medical help right away. Call your local emergency services (911 in the [place] Do not drive yourself to the hospital. Summary Chest wall pain is pain in or around the bones and muscles of your chest. Depending on the cause, it may be treated with ice, rest, medicines, and  avoiding activities that cause pain. Contact a health care provider if you have a fever, worsening chest pain, or new symptoms. Get help right away if you feel light-headed or you develop shortness of breath. These symptoms may be an emergency. This information is not intended to replace advice given to you by your health care provider. Make sure you discuss any questions you have with your health care provider. Document Revised: 04/26/2022 Document Reviewed: 04/26/2022 Elsevier Patient Education  2024 [employer]  

## 2022-11-29 ENCOUNTER — Telehealth: Payer: Self-pay | Admitting: Cardiovascular Disease

## 2022-11-29 NOTE — Telephone Encounter (Signed)
Pt c/o medication issue:  1. Name of Medication: diltiazem (CARDIZEM) 30 MG tablet  metoprolol tartrate (LOPRESSOR) 50 MG tablet   furosemide (LASIX) 20 MG tablet    2. How are you currently taking this medication (dosage and times per day)?   3. Are you having a reaction (difficulty breathing--STAT)?   4. What is your medication issue? Patient states that her endocrinologist has changed her medications and believes it has caused issues with her and she would like to discuss this. Requesting call back to discuss.

## 2022-11-29 NOTE — Telephone Encounter (Signed)
Patient states went to endocrinologist who stopped Lasix, and reduced metoprolol from 100 mg  AM and PM to 50 mg in the AM and PM. Her BP at that appt was 80/48. Advised would send message to provider regarding continuation of lower dose of metoprolol.  Do not see on lkist that Lasix was Dc'd so will need to know if she is to stop or continue. She will keep a log of BP to bring to next appt.    She states she had 2 episodes of chest pain that morning (7am) the day she saw endocrinology. She took her nitroglycerin twice that morning. It was resolved by the time she left for endocrinology (11 am).  Pain starts in back and was excruciating in her chest and her back. She has had several episodes of chest pain over the last several months.  Appt for July 30 with APP unless needs to be seen sooner. Please advise

## 2022-11-29 NOTE — Telephone Encounter (Signed)
OK to keep off lasix, but if her BP is normal now (95/60 or higher), please go back to the previous dose of metoprolol. OK to wait for July 30 appt.

## 2022-11-29 NOTE — Telephone Encounter (Signed)
Discussed with patient the recommendations from provider.  She states understanding. She will restart normal dose of metoprolol as her BP is back in normal range.

## 2022-12-01 ENCOUNTER — Other Ambulatory Visit: Payer: Self-pay

## 2022-12-12 NOTE — Progress Notes (Addendum)
Cardiology Clinic Note   Patient Name: Evelyn [name] Date of Encounter: 12/14/2022  Primary Care Provider:  Bennie Pierini, FNP Primary Cardiologist:  Evelyn Fair, MD  Patient Profile    82 year old female with history of coronary artery disease, history of stents to the LAD followed by bypass surgery (LIMA to LAD, SVG to diagonal, SVG to OM in 2004); repeat catheterization 2008 showed no change in coronary anatomy since 2013 with chronically occluded LAD artery with multiple occluded stents but has good downstream blood flow via bypasses.     Recent TIA which led to implantation of loop recorder that demonstrated the presence of asymptomatic paroxysmal atrial fibrillation with RVR. She is now being followed by EP, Dr. Elberta [name], with CHA2DS2-VASc score of 6 (TIA, gender, hypertension, CAD,) on Eliquis;   Other hx of hyperlipidemia, mild ascending aortic aneurysm, aortic insufficiency, hyperlipidemia and hypertension.    Last seen by Dr. Royann [name] on 12/10/2021.  He noted that she was seen in the emergency room 10 days earlier in the setting of atrial fibrillation with RVR.  Converted spontaneously to normal sinus rhythm.  On the last visit the patient had addition of potassium added due to low to normal potassium level.  Her Eliquis was adjusted for age and small body size to 2.5 mg twice daily.  She was not found to be a candidate for aortic valve replacement with history of previous sternotomy.  She was to follow-up with endocrinology concerning hypothyroidism versus iatrogenic hyperthyroidism.  Since being seen last, she is has been told by her endocrinologist to stop her Lasix and reduce her metoprolol from 100 mg in the a.m. and p.m. to 50 mg in the a.m. and p.m.  She was found to have hypotension with a blood pressure of 80/48.    Per recommendations by Dr. Royann [name] she was okay to stay off Lasix but if her blood pressure was 95/60 or higher she was to go back on previous dose  of metoprolol.  She was to bring in BP record on follow-up visit.  Past Medical History    Past Medical History:  Diagnosis Date   Aneurysm (HCC)    Aortic insufficiency    Atrial fibrillation (HCC)    CAD (coronary artery disease)    CABG LIMA to LAD,vein graft to diagonal,vein graft to OM   Dyslipidemia    Essential hypertension 09/16/2013   GERD (gastroesophageal reflux disease)    Hypothyroid    Past Surgical History:  Procedure Laterality Date   ABDOMINAL HYSTERECTOMY     BACK SURGERY  2009   BACK SURGERY     CORONARY ANGIOPLASTY  2003   cutting balloon atherectomy in-stent stenosis of the LAD.   CORONARY ANGIOPLASTY WITH STENT PLACEMENT  2002   remote PCI & stenting LAD,   CORONARY ARTERY BYPASS GRAFT  2004   LIMA to LAD,vein graft to diagonal,vein graft to OM   FOOT SURGERY     KYPHOPLASTY N/A 11/24/2021   Procedure: LUMBAR ONE KYPHOPLASTY;  Surgeon: Evelyn Abu, MD;  Location: [facility] OR;  Service: Neurosurgery;  Laterality: N/A;  LUMBAR ONE KYPHOPLASTY   LEFT HEART CATH AND CORS/GRAFTS ANGIOGRAPHY N/A 02/23/2017   Procedure: LEFT HEART CATH AND CORS/GRAFTS ANGIOGRAPHY;  Surgeon: Evelyn Ouch, MD;  Location: [facility] INVASIVE CV LAB;  Service: Cardiovascular;  Laterality: N/A;   LEFT HEART CATHETERIZATION WITH CORONARY ANGIOGRAM N/A 12/15/2011   Procedure: LEFT HEART CATHETERIZATION WITH CORONARY ANGIOGRAM;  Surgeon: Evelyn Fair, MD;  Location: [facility] CATH LAB;  Service:  Cardiovascular;  Laterality: N/A;   LOOP RECORDER INSERTION N/A 06/14/2018   Procedure: LOOP RECORDER INSERTION;  Surgeon: Evelyn Fair, MD;  Location: [facility] INVAS[facility] CV [facility];  Service: Cardiovascular;  Laterality: N/A;   NECK SURGERY     NM MYOCAR PERF WALL MOTION  12/01/2011   normal study-persistent exercise induced ECG changes raises the concern for ischemia (balanced ischemia).   NOSE SURGERY     PACEMAKER INSERTION     NOT PACEMAKER - LOOP RECORDER   TONSILLECTOMY  1948   TUBAL LIGATION       Allergies  Allergies  Allergen Reactions   Sulfa Antibiotics Swelling    History of Present Illness    Evelyn [name] returns today for ongoing assessment management of hypertension.  Medications have been adjusted by endocrinologist as above with reduced dose of metoprolol to 50 mg twice a day from 100 mg twice a day and Lasix was discontinued.  She was to keep track of her blood pressure and if blood pressure was 95/60 or above she was to go back up on the metoprolol dose.  She is being followed by Dr. Elberta [name] for atrial fibrillation.  And remains on Eliquis at 2.5 mg twice daily due to age and body habitus.  Evelyn [name] comes today stating that she has had recurrent chest discomfort requiring nitroglycerin 2 times last week, and then on occasion maybe once a week prior to that over the last few months.  She states over the summer she has felt more tired.  Last week she was awakened by rapid heart rhythm and did take a diltiazem 30 mg daily and 2 nitroglycerin as she was having some discomfort with this.  Once heart rate slowed down she had no further discomfort.   She states that sometimes she does have to stop what she is doing because of occasional chest discomfort.  She also continues to have chronic back pain for which she takes hydrocodone every 6 hours as needed.  She tries to remain active takes care of her self goes to the grocery store.  She is planning a beach trip next week to [facility].  She denies dizziness, lightheadedness, or presyncope.  No bleeding issues or excessive bruising.   Home Medications    Current Outpatient Medications  Medication Sig Dispense Refill   apixaban (ELIQUIS) 2.5 MG TABS tablet Take 1 tablet (2.5 mg total) by mouth 2 (two) times daily. 180 tablet 1   [facility] D3 125 MCG (5000 UT) capsule TAKE 1 CAPSULE BY MOUTH EVERY DAY 90 capsule 1   diltiazem (CARDIZEM) 30 MG tablet Take 1 tablet (30 mg total) by mouth every 6 (six) hours as needed (for rapid  heart rate). 30 tablet 3   furosemide (LASIX) 20 MG tablet Take 1 tablet (20 mg total) by mouth daily. 90 tablet 1   [START ON 01/21/2023] HYDROcodone-acetaminophen (NORCO) 5-325 MG tablet Take 1 tablet by mouth every 6 (six) hours as needed for moderate pain. 30 tablet 0   hydrocortisone 2.5 % cream APPLY TO AFFECTED AREA TWICE A DAY (Patient taking differently: Apply 1 Application topically daily as needed (itching).) 28.35 g 6   isosorbide mononitrate (IMDUR) 60 MG 24 hr tablet TAKE 1 TABLET (60 MG TOTAL) DAILY BY MOUTH. 90 tablet 1   levothyroxine (SYNTHROID) 75 MCG tablet Take 1 tablet (75 mcg total) by mouth daily before breakfast. 90 tablet 1   loratadine (CLARITIN) 10 MG tablet TAKE 1 TABLET BY MOUTH EVERY DAY 90 tablet 3  methocarbamol (ROBAXIN) 500 MG tablet TAKE 1 TABLET (500 MG TOTAL) BY MOUTH EVERY 12 (TWELVE) HOURS AS NEEDED FOR MUSCLE SPASMS. 20 tablet 0   metoprolol tartrate (LOPRESSOR) 25 MG tablet Take 1 tablet (25 mg total) by mouth 2 (two) times daily. 60 tablet 3   polyethylene glycol (MIRALAX / GLYCOLAX) 17 g packet Take 17 g by mouth daily as needed for mild constipation. 14 each 0   potassium chloride (KLOR-CON) 10 MEQ tablet Take 1 tablet (10 mEq total) by mouth daily. 90 tablet 3   rosuvastatin (CRESTOR) 20 MG tablet Take 1 tablet (20 mg total) by mouth daily. 90 tablet 1   senna (SENOKOT) 8.6 MG TABS tablet Take 1 tablet (8.6 mg total) by mouth 2 (two) times daily. 120 tablet 0   Zoledronic Acid (RECLAST IV) Inject into the vein. Once yearly     [START ON 12/22/2022] HYDROcodone-acetaminophen (NORCO) 5-325 MG tablet Take 1 tablet by mouth every 6 (six) hours as needed for moderate pain. (Patient not taking: Reported on 12/14/2022) 60 tablet 0   HYDROcodone-acetaminophen (NORCO) 5-325 MG tablet Take 1 tablet by mouth 2 (two) times daily. (Patient not taking: Reported on 12/14/2022) 60 tablet 0   No current facility-administered medications for this visit.     Family History     Family History  Problem Relation Age of Onset   Heart attack Mother    Heart failure Father    Hypertension Father    Heart disease Father    Asthma Maternal Grandmother    Heart disease Sister    Stroke Brother    Voice disorder Daughter        vocal dystonia   Stroke Daughter    Stroke Son    Cancer Sister 34   She indicated that her mother is deceased. She indicated that her father is deceased. She indicated that both of her sisters are deceased. She indicated that her brother is alive. She indicated that her maternal grandmother is deceased. She indicated that her daughter is alive. She indicated that her son is alive.  Social History    Social History   Socioeconomic History   Marital status: Widowed    Spouse name: Not on file   Number of children: 2   Years of education: Associates   Highest education level: Associate degree: academic program  Occupational History   Occupation: Retired  Tobacco Use   Smoking status: Never   Smokeless tobacco: Never  Vaping Use   Vaping status: Never Used  Substance and Sexual Activity   Alcohol use: No   Drug use: No   Sexual activity: Not Currently  Other Topics Concern   Not on file  Social History Narrative   Mother of 2 with 4 grandchildren   Right-handed   Caffeine: 8-10 cups coffee   Pacer    Are you right handed or left handed? right   Are you currently employed ?    What is your current occupation? retired   Do you live at home alone? With son   Who lives with you?    What type of home do you live in: 1 story or 2 story? one       Social Determinants of Health   Financial Resource Strain: Low Risk  (06/21/2022)   Overall Financial Resource Strain (CARDIA)    Difficulty of Paying Living Expenses: Not hard at all  Food Insecurity: No Food Insecurity (06/21/2022)   Hunger Vital Sign    Worried About  Running Out of Food in the Last Year: Never true    Ran Out of Food in the Last Year: Never true   Transportation Needs: No Transportation Needs (06/21/2022)   PRAPARE - Administrator, Civil Service (Medical): No    Lack of Transportation (Non-Medical): No  Physical Activity: Insufficiently Active (06/21/2022)   Exercise Vital Sign    Days of Exercise per Week: 3 days    Minutes of Exercise per Session: 30 min  Stress: No Stress Concern Present (06/21/2022)   [employer] of Occupational Health - Occupational Stress Questionnaire    Feeling of Stress : Not at all  Social Connections: Moderately Integrated (06/21/2022)   Social Connection and Isolation Panel [NHANES]    Frequency of Communication with Friends and Family: More than three times a week    Frequency of Social Gatherings with Friends and Family: More than three times a week    Attends Religious Services: 1 to 4 times per year    Active Member of [employer] or Organizations: Yes    Attends Banker Meetings: More than 4 times per year    Marital Status: Widowed  Intimate Partner Violence: Not At Risk (06/21/2022)   Humiliation, Afraid, Rape, and Kick questionnaire    Fear of Current or Ex-Partner: No    Emotionally Abused: No    Physically Abused: No    Sexually Abused: No     Review of Systems    General:  No chills, fever, night sweats or weight changes.  Cardiovascular:  No chest pain, dyspnea on exertion, edema, orthopnea, palpitations, paroxysmal nocturnal dyspnea. Dermatological: No rash, lesions/masses Respiratory: No cough, dyspnea Urologic: No hematuria, dysuria Abdominal:   No nausea, vomiting, diarrhea, bright red blood per rectum, melena, or hematemesis Neurologic:  No visual changes, wkns, changes in mental status. All other systems reviewed and are otherwise negative except as noted above.  EKG Interpretation Date/Time:  Tuesday December 14 2022 11:33:50 EDT Ventricular Rate:  49 PR Interval:  164 QRS Duration:  88 QT Interval:  432 QTC Calculation: 390 R Axis:   26  Text  Interpretation: Sinus bradycardia Possible Left atrial enlargement Nonspecific ST abnormality When compared with ECG of 22-Aug-2022 14:10, PREVIOUS ECG IS PRESENT Confirmed by Joni Reining [phone]) on 12/14/2022 12:38:26 PM    Physical Exam    VS:  BP (!) 88/58   Pulse (!) 49   Ht 5\' 2"  (1.575 m)   Wt 105 lb 6.4 oz (47.8 kg)   SpO2 98%   BMI 19.28 kg/m  , BMI Body mass index is 19.28 kg/m.     GEN: Well nourished, well developed, in no acute distress. HEENT: normal. Neck: Supple, no JVD, carotid bruits, or masses. Cardiac: RRR, bradycardic, no murmurs, rubs, or gallops. No clubbing, cyanosis, edema.  Radials/DP/PT 2+ and equal bilaterally.  Respiratory:  Respirations regular and unlabored, clear to auscultation bilaterally. GI: Soft, nontender, nondistended, BS + x 4. MS: no deformity or atrophy. Skin: warm and dry, no rash. Neuro:  Strength and sensation are intact. Psych: Normal affect.  EKG Interpretation Date/Time:  Tuesday December 14 2022 11:33:50 EDT Ventricular Rate:  49 PR Interval:  164 QRS Duration:  88 QT Interval:  432 QTC Calculation: 390 R Axis:   26  Text Interpretation: Sinus bradycardia Possible Left atrial enlargement Nonspecific ST abnormality When compared with ECG of 22-Aug-2022 14:10, PREVIOUS ECG IS PRESENT Confirmed by Joni Reining [phone]) on 12/14/2022 12:38:26 PM   Lab Results  Component Value Date   WBC 11.8 (H) 08/22/2022   HGB 12.9 08/22/2022   HCT 39.6 08/22/2022   MCV 94.3 08/22/2022   PLT 206 08/22/2022   Lab Results  Component Value Date   CREATININE 0.61 08/22/2022   BUN 15 08/22/2022   NA 138 08/22/2022   K 3.7 08/22/2022   CL 105 08/22/2022   CO2 23 08/22/2022   Lab Results  Component Value Date   ALT 9 01/01/2022   AST 16 01/01/2022   ALKPHOS 60 01/01/2022   BILITOT 0.4 01/01/2022   Lab Results  Component Value Date   CHOL 116 11/23/2022   HDL 58 11/23/2022   LDLCALC 44 11/23/2022   TRIG 68 11/23/2022   CHOLHDL  2.0 11/23/2022    Lab Results  Component Value Date   HGBA1C 5.8 (H) 09/17/2016     Review of Prior Studies  [facility] 20/09/2016 Dr. Maryruth Hancock EKG Interpretation Date/Time:  Tuesday December 14 2022 11:33:50 EDT Ventricular Rate:  49 PR Interval:  164 QRS Duration:  88 QT Interval:  432 QTC Calculation: 390 R Axis:   26  Text Interpretation: Sinus bradycardia Possible Left atrial enlargement Nonspecific ST abnormality When compared with ECG of 22-Aug-2022 14:10, PREVIOUS ECG IS PRESENT Confirmed by Joni Reining [phone]) on 12/14/2022 12:38:26 PMThe left ventricular systolic function is normal. LV end diastolic pressure is mildly elevated. The left ventricular ejection fraction is 55-65% by visual estimate. Ost LAD to Mid LAD lesion, 100 %stenosed. Ost Cx lesion, 20 %stenosed. LM lesion, 20 %stenosed. Prox RCA lesion, 20 %stenosed. SVG and is normal in caliber and anatomically normal. LIMA and is normal in caliber and anatomically normal.   1. Significant underlying one-vessel coronary artery disease with chronically occluded stents in the LAD. Patent LIMA to LAD and patent SVG to first diagonal. No significant obstructive disease affecting the distal left main or ostial left circumflex. The SVG to OM is known to be occluded and was not injected.   2. Normal LV systolic function mildly elevated left ventricular end-diastolic pressure.   Recommendations: I don't see clear culprit for the patient's symptoms. Some of her symptoms might be related to ischemia in the proximal LAD distribution. Imdur 30 mg once daily was added.    Echocardiogram 10/12/2016  Left ventricle: The cavity size was normal. Systolic function was    normal. The estimated ejection fraction was in the range of 55%    to 60%. Mild hypokinesis of the inferior and inferoseptal    myocardium. Doppler parameters are consistent with abnormal left    ventricular relaxation (grade 1 diastolic dysfunction). Doppler     parameters are consistent with indeterminate ventricular filling    pressure.  - Aortic valve: Transvalvular velocity was within the normal range.    There was no stenosis. There was mild regurgitation.  - Aorta: Ascending aortic diameter: 37 mm (S).  - Ascending aorta: The ascending aorta was mildly dilated.  - Mitral valve: Transvalvular velocity was within the normal range.    There was no evidence for stenosis. There was mild regurgitation.  - Right ventricle: The cavity size was normal. Wall thickness was    normal. Systolic function was normal.  - Atrial septum: No defect or patent foramen ovale was identified    by color flow Doppler or saline microcavitation study.  - Tricuspid valve: There was moderate regurgitation.  - Pulmonary arteries: Systolic pressure was within the normal    range. PA peak pressure: 27 mm Hg (S).  Assessment & Plan   1.  Coronary artery disease: Status post coronary bypass grafting with LIMA to LAD, and SVG to first diagonal, with repeat cardiac catheterization October 2018 revealing patent LIMA to LAD and patent SVG to first diagonal.  She continues to have discomfort in her chest for which she takes nitroglycerin on occasion.  She mostly feels the chest discomfort when she is exerting yourself or when her heart rate is elevated.  She remains on isosorbide 60 mg daily.  Will not make any changes at this time.  Can consider PET scan if necessary to evaluate for ischemia.  Uncertain if her discomfort is related to microvascular ischemia versus her chronic chest discomfort, versus new areas of more significant ischemia.  She is going to follow-up with Dr. Royann [name] in 6 months.  2.  Bradycardia: Despite decreased dose of metoprolol to 50 mg twice daily heart rate remains in the 40s.  I will decrease her metoprolol even further to 25 mg twice daily and will repeat EKG in 1 month to evaluate her response to medication.  This may also help to give her a little bit  more blood pressure.  Uncertain if hypotension and bradycardia are contributing to her chest discomfort.  She is asymptomatic with the bradycardia but does have trouble with exertional activities.  She states she is felt little more tired over the summer.  3.  Paroxysmal Atrial Fibrillation; Patient does have diltiazem 30 mg p.o. which she takes as needed for rapid heart rate.  She did take her diltiazem 1 night last week when she was awakened with rapid heart rhythm.  This did cause some chest discomfort.  She took a nitroglycerin along with the diltiazem.  After about 30 minutes heart rate normalized.  She did take a second nitroglycerin prior to the normalization of heart rate due to discomfort which did improve.  No changes in her medication regimen at this time.  She does have an ILR.  It was recently checked on November 17, 2022 which did not show any evidence of arrhythmias.  4.  Hypotension: The patient is fairly asymptomatic but she does have some generalized fatigue.  Will follow-up with blood pressure when we see her again with for her EKG to evaluate her response to decrease dose of metoprolol.       Signed, Bettey Mare. Liborio Nixon, ANP, AACC   12/14/2022 12:38 PM      Office [phone] Fax [phone]  Notice: This dictation was prepared with Dragon dictation along with smaller phrase technology. Any transcriptional errors that result from this process are unintentional and may not be corrected upon review.

## 2022-12-14 ENCOUNTER — Encounter: Payer: Self-pay | Admitting: Adult Health

## 2022-12-14 ENCOUNTER — Ambulatory Visit: Payer: Medicare HMO | Attending: Adult Health | Admitting: Adult Health

## 2022-12-14 VITALS — BP 88/58 | HR 49 | Ht 62.0 in | Wt 105.4 lb

## 2022-12-14 DIAGNOSIS — R001 Bradycardia, unspecified: Secondary | ICD-10-CM | POA: Diagnosis not present

## 2022-12-14 DIAGNOSIS — I480 Paroxysmal atrial fibrillation: Secondary | ICD-10-CM

## 2022-12-14 DIAGNOSIS — E7800 Pure hypercholesterolemia, unspecified: Secondary | ICD-10-CM

## 2022-12-14 DIAGNOSIS — D6869 Other thrombophilia: Secondary | ICD-10-CM

## 2022-12-14 DIAGNOSIS — I952 Hypotension due to drugs: Secondary | ICD-10-CM

## 2022-12-14 MED ORDER — NITROGLYCERIN 0.4 MG SL SUBL: 0.4000 mg | SUBLINGUAL_TABLET | SUBLINGUAL | 2 refills | Status: DC | PRN

## 2022-12-14 MED ORDER — METOPROLOL TARTRATE 25 MG PO TABS: 25.0000 mg | ORAL_TABLET | Freq: Two times a day (BID) | ORAL | 3 refills | Status: DC

## 2022-12-14 NOTE — Patient Instructions (Addendum)
Medication Instructions:  Decrease Metoprolol Tartrate to 25 mg ( Take 1 Tablet Twice Daily). *If you need a refill on your cardiac medications before your next appointment, please call your pharmacy*   Lab Work: No Labs If you have labs (blood work) drawn today and your tests are completely normal, you will receive your results only by: MyChart Message (if you have MyChart) OR A paper copy in the mail If you have any lab test that is abnormal or we need to change your treatment, we will call you to review the results.   Testing/Procedures: No Testing   Follow-Up: At [facility], you and your health needs are our priority.  As part of our continuing mission to provide you with exceptional heart care, we have created designated Provider Care Teams.  These Care Teams include your primary Cardiologist (physician) and Advanced Practice Providers (APPs -  Physician Assistants and Nurse Practitioners) who all work together to provide you with the care you need, when you need it.  We recommend signing up for the patient portal called "MyChart".  Sign up information is provided on this After Visit Summary.  MyChart is used to connect with patients for Virtual Visits (Telemedicine).  Patients are able to view lab/test results, encounter notes, upcoming appointments, etc.  Non-urgent messages can be sent to your provider as well.   To learn more about what you can do with MyChart, go to [email or web address].    Your next appointment:   1 month(s)  Provider:   Joni Reining, DNP, ANP

## 2022-12-16 ENCOUNTER — Emergency Department (HOSPITAL_COMMUNITY): Payer: Medicare HMO

## 2022-12-16 ENCOUNTER — Encounter (HOSPITAL_COMMUNITY): Payer: Self-pay | Admitting: *Deleted

## 2022-12-16 ENCOUNTER — Emergency Department (HOSPITAL_COMMUNITY)
Admission: EM | Admit: 2022-12-16 | Discharge: 2022-12-16 | Disposition: A | Discharge status: Home / Self Care | Payer: Medicare HMO | Attending: Emergency Medicine | Admitting: Emergency Medicine

## 2022-12-16 ENCOUNTER — Other Ambulatory Visit: Payer: Self-pay

## 2022-12-16 DIAGNOSIS — I10 Essential (primary) hypertension: Secondary | ICD-10-CM | POA: Insufficient documentation

## 2022-12-16 DIAGNOSIS — I2510 Atherosclerotic heart disease of native coronary artery without angina pectoris: Secondary | ICD-10-CM | POA: Insufficient documentation

## 2022-12-16 DIAGNOSIS — Z79899 Other long term (current) drug therapy: Secondary | ICD-10-CM | POA: Insufficient documentation

## 2022-12-16 DIAGNOSIS — E039 Hypothyroidism, unspecified: Secondary | ICD-10-CM | POA: Insufficient documentation

## 2022-12-16 DIAGNOSIS — M549 Dorsalgia, unspecified: Secondary | ICD-10-CM | POA: Diagnosis present

## 2022-12-16 DIAGNOSIS — Z7901 Long term (current) use of anticoagulants: Secondary | ICD-10-CM | POA: Insufficient documentation

## 2022-12-16 DIAGNOSIS — I4891 Unspecified atrial fibrillation: Secondary | ICD-10-CM | POA: Diagnosis not present

## 2022-12-16 DIAGNOSIS — M545 Low back pain, unspecified: Secondary | ICD-10-CM | POA: Diagnosis not present

## 2022-12-16 MED ORDER — OXYCODONE-ACETAMINOPHEN 5-325 MG PO TABS
1.0000 | ORAL_TABLET | Freq: Once | ORAL | Status: AC
  Administered 2022-12-16: 1 via ORAL
  Filled 2022-12-16: qty 1

## 2022-12-16 NOTE — ED Triage Notes (Signed)
Pt reporting lower (right) back pain since waking up this morning. Denies injury. States she has broke her back before and had surgery. Some numbness and tingling in the right finger tips, ongoing for several weeks.

## 2022-12-16 NOTE — ED Notes (Signed)
Pt informed of needed urine

## 2022-12-16 NOTE — ED Notes (Signed)
Back pain 9/10   Hx back surgery last year.  States always has some pain but couldn't get out of bed this am.  Has some numbness in fingers rt hand x 1 month  denies numbness of feet.  Was able to get out of wc and onto bed with assistance

## 2022-12-16 NOTE — ED Provider Notes (Signed)
[facility] Provider Note   CSN: [id number] Arrival date & time: 12/16/22  1050     History  Chief Complaint  Patient presents with   Back Pain    Evelyn [name] is a 82 y.o. female.   Back Pain   82 year old female presents emergency department with complaints of back pain.  Patient with history of chronic back pain with acute worsening upon awakening this morning.  Reports pain in middle and just right of middle in her low back.  Says she has a history of L1 kyphoplasty performed in July of this past year due to L1 fracture and states current pain feels similar.  She is on Norco at baseline for pain and has not had any of her at home medication for pain today.  Denies any saddle anesthesia, bowel/bladder dysfunction, weakness/sensory deficits in lower extremities, fever, history of IV drug use, prolonged corticosteroid use.  Denies any abdominal pain, urinary symptoms, nausea, vomiting.  Past medical history significant for atrial fibrillation on Eliquis, hypertension, aortic insufficiency, CAD, dyslipidemia, GERD, hypothyroidism, chronic back pain, ascending aortic aneurysm, hypertension  Home Medications Prior to Admission medications   Medication Sig Start Date End Date Taking? Authorizing Provider  apixaban (ELIQUIS) 2.5 MG TABS tablet Take 1 tablet (2.5 mg total) by mouth 2 (two) times daily. 08/23/22   Bennie Pierini, FNP  [employer] D3 125 MCG (5000 UT) capsule TAKE 1 CAPSULE BY MOUTH EVERY DAY 09/14/22   Roma Kayser, MD  diltiazem (CARDIZEM) 30 MG tablet Take 1 tablet (30 mg total) by mouth every 6 (six) hours as needed (for rapid heart rate). 08/23/22   Daphine Deutscher Mary-Margaret, FNP  furosemide (LASIX) 20 MG tablet Take 1 tablet (20 mg total) by mouth daily. 08/23/22   Daphine Deutscher Mary-Margaret, FNP  HYDROcodone-acetaminophen (NORCO) 5-325 MG tablet Take 1 tablet by mouth every 6 (six) hours as needed for moderate pain. 01/21/23  02/20/23  Daphine Deutscher Mary-Margaret, FNP  HYDROcodone-acetaminophen (NORCO) 5-325 MG tablet Take 1 tablet by mouth every 6 (six) hours as needed for moderate pain. Patient not taking: Reported on 12/14/2022 12/22/22 01/21/23  Bennie Pierini, FNP  HYDROcodone-acetaminophen (NORCO) 5-325 MG tablet Take 1 tablet by mouth 2 (two) times daily. Patient not taking: Reported on 12/14/2022 11/22/22 12/22/22  Bennie Pierini, FNP  hydrocortisone 2.5 % cream APPLY TO AFFECTED AREA TWICE A DAY Patient taking differently: Apply 1 Application topically daily as needed (itching). 11/13/20   Daphine Deutscher Mary-Margaret, FNP  isosorbide mononitrate (IMDUR) 60 MG 24 hr tablet TAKE 1 TABLET (60 MG TOTAL) DAILY BY MOUTH. 08/23/22   Daphine Deutscher, Mary-Margaret, FNP  levothyroxine (SYNTHROID) 75 MCG tablet Take 1 tablet (75 mcg total) by mouth daily before breakfast. 11/25/22   Nida, Denman George, MD  loratadine (CLARITIN) 10 MG tablet TAKE 1 TABLET BY MOUTH EVERY DAY 11/13/20   Daphine Deutscher, Mary-Margaret, FNP  methocarbamol (ROBAXIN) 500 MG tablet TAKE 1 TABLET (500 MG TOTAL) BY MOUTH EVERY 12 (TWELVE) HOURS AS NEEDED FOR MUSCLE SPASMS. 11/03/21   Daphine Deutscher Mary-Margaret, FNP  metoprolol tartrate (LOPRESSOR) 25 MG tablet Take 1 tablet (25 mg total) by mouth 2 (two) times daily. 12/14/22   Jodelle Gross, NP  polyethylene glycol (MIRALAX / GLYCOLAX) 17 g packet Take 17 g by mouth daily as needed for mild constipation. 11/25/21   Kathlen Mody, MD  potassium chloride (KLOR-CON) 10 MEQ tablet Take 1 tablet (10 mEq total) by mouth daily. 08/23/22 08/18/23  Bennie Pierini, FNP  rosuvastatin (CRESTOR) 20  MG tablet Take 1 tablet (20 mg total) by mouth daily. 08/23/22   Daphine Deutscher, Mary-Margaret, FNP  senna (SENOKOT) 8.6 MG TABS tablet Take 1 tablet (8.6 mg total) by mouth 2 (two) times daily. 11/25/21   Kathlen Mody, MD  Zoledronic Acid (RECLAST IV) Inject into the vein. Once yearly    [provider]      Allergies    Sulfa antibiotics     Review of Systems   Review of Systems  Musculoskeletal:  Positive for back pain.  All other systems reviewed and are negative.   Physical Exam Updated Vital Signs BP (!) 146/62   Pulse 64   Temp 97.6 F (36.4 C)   Resp (!) 22   Ht 5\' 2"  (1.575 m)   Wt 47.6 kg   SpO2 99%   BMI 19.20 kg/m  Physical Exam Vitals and nursing note reviewed.  Constitutional:      General: She is not in acute distress.    Appearance: She is well-developed.  HENT:     Head: Normocephalic and atraumatic.  Eyes:     Conjunctiva/sclera: Conjunctivae normal.  Cardiovascular:     Rate and Rhythm: Normal rate. Rhythm irregular.  Pulmonary:     Effort: Pulmonary effort is normal. No respiratory distress.     Breath sounds: Normal breath sounds. No wheezing, rhonchi or rales.  Abdominal:     Palpations: Abdomen is soft.     Tenderness: There is no abdominal tenderness. There is no guarding.  Musculoskeletal:        General: No swelling.     Cervical back: Neck supple.     Comments: Midline tenderness of lower lumbar spine as well as paraspinal tenderness noted in the right lumbar region.  No sensory deficits along major nerve distributions of lower extremities.  Pedal pulses 2+ bilaterally.  Patient with symmetric strength for bilateral hip flexion/extension, knee flexion/extension, ankle dorsi/plantarflexion 5/5.  Skin:    General: Skin is warm and dry.     Capillary Refill: Capillary refill takes less than 2 seconds.  Neurological:     Mental Status: She is alert.  Psychiatric:        Mood and Affect: Mood normal.     ED Results / Procedures / Treatments   Labs (all labs ordered are listed, but only abnormal results are displayed) Labs Reviewed - No data to display   EKG None  Radiology CT Lumbar Spine Wo Contrast  Result Date: 12/16/2022 CLINICAL DATA:  Lumbar radiculopathy.  Trauma. EXAM: CT LUMBAR SPINE WITHOUT CONTRAST TECHNIQUE: Multidetector CT imaging of the lumbar spine was  performed without intravenous contrast administration. Multiplanar CT image reconstructions were also generated. RADIATION DOSE REDUCTION: This exam was performed according to the departmental dose-optimization program which includes automated exposure control, adjustment of the mA and/or kV according to patient size and/or use of iterative reconstruction technique. COMPARISON:  Lumbar CT 04/28/2022 FINDINGS: Segmentation: Transitional S1 vertebra based on prior numbering. Alignment: Hyperlordosis and mild scoliosis. Vertebrae: Remote L1 compression fracture with cement augmentation. No acute fracture or aggressive bone lesion. Paraspinal and other soft tissues: No perispinal mass or inflammation noted. Disc levels: T12- L1: Posttraumatic superior endplate distortion at L1 with mild retropulsion. Mild disc height loss. No high-grade stenosis or change suspected. L1-L2: Disc narrowing and endplate degeneration. Moderate left foraminal narrowing. L2-L3: Disc collapse and endplate degeneration with left eccentric bulging. Degenerative facet spurring asymmetric to the left. Moderate left foraminal impingement. L3-L4: The disc is narrowed and bulging with  ligamentum flavum thickening. Mild facet spurring. Moderate thecal sac stenosis. L4-L5: Disc narrowing and bulging. Moderate degenerative facet spurring. Moderate triangular narrowing the thecal sac. Patent appearance of the foramina L5-S1:Bulging disc and degenerative facet spurring asymmetric to the right. Mild triangular narrowing of the thecal sac. IMPRESSION: 1. No acute finding. 2. Remote L1 compression fracture with cement augmentation. 3. 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 Up to moderate foraminal narrowing on the left at L1-2 and L2-3. Electronically Signed   By: Tiburcio Pea M.D.   On: 12/16/2022 13:40    Procedures Procedures    Medications Ordered in ED Medications  oxyCODONE-acetaminophen  (PERCOCET/ROXICET) 5-325 MG per tablet 1 tablet (1 tablet Oral Given 12/16/22 1305)    ED Course/ Medical Decision Making/ A&P                                 Medical Decision Making Amount and/or Complexity of Data Reviewed Labs: ordered. Radiology: ordered.  Risk Prescription drug management.   This patient presents to the ED for concern of back pain, this involves an extensive number of treatment options, and is a complaint that carries with it a high risk of complications and morbidity.  The differential diagnosis includes fracture, strain/sprain, dislocation, pyelonephritis, nephrolithiasis, AAA, aortic dissection, cauda equina, spinal epidural abscess   Co morbidities that complicate the patient evaluation  See HPI   Additional history obtained:  Additional history obtained from EMR External records from outside source obtained and reviewed including hospital records   Lab Tests:  N/a   Imaging Studies ordered:  I ordered imaging studies including CT lumbar spine I independently visualized and interpreted imaging which showed no acute finding.  Remote L1 compression fracture with cement augmentation.  Generalized lumbar spine degeneration of chronic moderate spinal stenosis greatest at L3-4 up to moderate foraminal narrowing at L1 and 2 and L2 and 3 I agree with the radiologist interpretation  Cardiac Monitoring: / EKG:  The patient was maintained on a cardiac monitor.  I personally viewed and interpreted the cardiac monitored which showed an underlying rhythm of: Irregular irregular rhythm   Consultations Obtained:  I requested consultation with attending physician Dr. Lynelle Doctor.  Treatment treatment plan going forward   Problem List / ED Course / Critical interventions / Medication management  Back pain I ordered medication including percocet  Reevaluation of the patient after these medicines showed that the patient improved I have reviewed the patients home  medicines and have made adjustments as needed   Social Determinants of Health:  Denies tobacco, licit drug use   Test / Admission - Considered:  Back pain Vitals signs significant for mild hypertension blood pressure 146/62. Otherwise within normal range and stable throughout visit. Imaging studies significant for: See above 82 year old female presents emergency department with complaints of atraumatic back pain in her lower back that began after waking this morning.  On exam, patient with some midline tenderness in her lumbar spine as well as right paraspinal region.  Given reporting of symptoms similar to prior lumbar fracture of which she had subsequent surgery, CT imaging was obtained.  CT imaging was negative for any acute fracture/dislocation but evidence of degenerative changes and postsurgical repair as indicated above.  Patient noted significant improvement/near resolution of symptoms after pain medication was administered on the emergency department.  Patient without red flag signs on HPI/PE for back pain so MRI imaging  was foregone at this point given low suspicion for cauda equina, spinal epidural abscess or other spinal cord injury/compression.  Low suspicion for pyelonephritis/nephrolithiasis given lack of CVA tenderness/urinary symptoms.  Low suspicion for aortic dissection given lack of pulse deficits, significant hypertension and reproducible tenderness in the lumbar spine/paraspinal region..  Recommend continued pain medication regimen at home and follow-up with neurosurgeon in the outpatient setting for further assessment/evaluation.  Treatment plan discussed at length with patient and she acknowledges and was agreeable to said plan.  Patient will well-appearing, afebrile in no acute distress, ambulating independently without assistance. Worrisome signs and symptoms were discussed with the patient, and the patient acknowledged understanding to return to the ED if noticed. Patient was  stable upon discharge.          Final Clinical Impression(s) / ED Diagnoses Final diagnoses:  Acute low back pain, unspecified back pain laterality, unspecified whether sciatica present    Rx / DC Orders ED Discharge Orders     None         Peter Garter, [place] 12/16/22 1544    Linwood Dibbles, MD 12/17/22 [phone]

## 2022-12-16 NOTE — Discharge Instructions (Signed)
As discussed, continue take your pain medication at home for your back pain.  Recommend gentle stretching exercises at home.  Also recommend follow-up with your spinal doctor in the outpatient setting for reevaluation of your symptoms.  Please not hesitate to return to emergency department for worrisome signs and symptoms we discussed become apparent.

## 2022-12-20 ENCOUNTER — Ambulatory Visit: Payer: Medicare HMO

## 2022-12-20 ENCOUNTER — Telehealth: Payer: Self-pay

## 2022-12-20 NOTE — Telephone Encounter (Signed)
Transition Care Management Unsuccessful Follow-up Telephone Call  Date of discharge and from where:  12/16/2022 [facility] ED   Attempts:  1st Attempt  Reason for unsuccessful TCM follow-up call:  No answer and no voicemail.   Patient has follow up appt tomorrow with PCP

## 2022-12-21 ENCOUNTER — Other Ambulatory Visit: Payer: Medicare HMO

## 2022-12-23 ENCOUNTER — Telehealth: Payer: Self-pay | Admitting: Nurse Practitioner

## 2022-12-23 NOTE — Telephone Encounter (Signed)
Evelyn [name] patient

## 2022-12-24 MED ORDER — HYDROCODONE-ACETAMINOPHEN 5-325 MG PO TABS: 1.0000 | ORAL_TABLET | Freq: Four times a day (QID) | ORAL | 0 refills | Status: DC | PRN

## 2022-12-24 NOTE — Telephone Encounter (Signed)
One time rx sent to [facility]. Keep follow up with PCP.   Jannifer Rodney, FNP

## 2022-12-24 NOTE — Telephone Encounter (Signed)
Patient aware and verbalized understanding. °

## 2022-12-30 ENCOUNTER — Ambulatory Visit: Payer: Medicare HMO | Admitting: "Endocrinology

## 2023-01-04 ENCOUNTER — Ambulatory Visit: Payer: Medicare HMO | Admitting: "Endocrinology

## 2023-01-04 ENCOUNTER — Encounter: Payer: Self-pay | Admitting: "Endocrinology

## 2023-01-04 VITALS — BP 126/78 | HR 72 | Ht 62.0 in | Wt 111.2 lb

## 2023-01-04 DIAGNOSIS — E782 Mixed hyperlipidemia: Secondary | ICD-10-CM | POA: Diagnosis not present

## 2023-01-04 DIAGNOSIS — E039 Hypothyroidism, unspecified: Secondary | ICD-10-CM

## 2023-01-04 DIAGNOSIS — I959 Hypotension, unspecified: Secondary | ICD-10-CM

## 2023-01-04 DIAGNOSIS — E042 Nontoxic multinodular goiter: Secondary | ICD-10-CM | POA: Diagnosis not present

## 2023-01-04 NOTE — Progress Notes (Signed)
01/04/2023      Endocrinology follow-up note   Subjective:    Patient ID: Evelyn [name], female    DOB: Apr 18, 1941, PCP Bennie Pierini, FNP   Past Medical History:  Diagnosis Date   Aneurysm [facility])    Aortic insufficiency    Atrial fibrillation (HCC)    CAD (coronary artery disease)    CABG LIMA to LAD,vein graft to diagonal,vein graft to OM   Dyslipidemia    Essential hypertension 09/16/2013   GERD (gastroesophageal reflux disease)    Hypothyroid    Past Surgical History:  Procedure Laterality Date   ABDOMINAL HYSTERECTOMY     BACK SURGERY  2009   BACK SURGERY     CORONARY ANGIOPLASTY  2003   cutting balloon atherectomy in-stent stenosis of the LAD.   CORONARY ANGIOPLASTY WITH STENT PLACEMENT  2002   remote PCI & stenting LAD,   CORONARY ARTERY BYPASS GRAFT  2004   LIMA to LAD,vein graft to diagonal,vein graft to OM   FOOT SURGERY     KYPHOPLASTY N/A 11/24/2021   Procedure: LUMBAR ONE KYPHOPLASTY;  Surgeon: Barnett Abu, MD;  Location: [facility] OR;  Service: Neurosurgery;  Laterality: N/A;  LUMBAR ONE KYPHOPLASTY   LEFT HEART CATH AND CORS/GRAFTS ANGIOGRAPHY N/A 02/23/2017   Procedure: LEFT HEART CATH AND CORS/GRAFTS ANGIOGRAPHY;  Surgeon: Iran Ouch, MD;  Location: [facility] INVASIVE CV LAB;  Service: Cardiovascular;  Laterality: N/A;   LEFT HEART CATHETERIZATION WITH CORONARY ANGIOGRAM N/A 12/15/2011   Procedure: LEFT HEART CATHETERIZATION WITH CORONARY ANGIOGRAM;  Surgeon: Thurmon Fair, MD;  Location: [facility] CATH LAB;  Service: Cardiovascular;  Laterality: N/A;   LOOP RECORDER INSERTION N/A 06/14/2018   Procedure: LOOP RECORDER INSERTION;  Surgeon: Thurmon Fair, MD;  Location: [facility] INVASIVE CV LAB;  Service: Cardiovascular;  Laterality: N/A;   NECK SURGERY     NM MYOCAR PERF WALL MOTION  12/01/2011   normal study-persistent exercise induced ECG changes raises the concern for ischemia (balanced ischemia).   NOSE SURGERY     PACEMAKER INSERTION     NOT PACEMAKER  - LOOP RECORDER   TONSILLECTOMY  1948   TUBAL LIGATION     Social History   Socioeconomic History   Marital status: Widowed    Spouse name: Not on file   Number of children: 2   Years of education: Associates   Highest education level: Associate degree: academic program  Occupational History   Occupation: Retired  Tobacco Use   Smoking status: Never   Smokeless tobacco: Never  Vaping Use   Vaping status: Never Used  Substance and Sexual Activity   Alcohol use: No   Drug use: No   Sexual activity: Not Currently  Other Topics Concern   Not on file  Social History Narrative   Mother of 2 with 4 grandchildren   Right-handed   Caffeine: 8-10 cups coffee   Pacer    Are you right handed or left handed? right   Are you currently employed ?    What is your current occupation? retired   Do you live at home alone? With son   Who lives with you?    What type of home do you live in: 1 story or 2 story? one       Social Determinants of Health   Financial Resource Strain: Low Risk  (06/21/2022)   Overall Financial Resource Strain (CARDIA)    Difficulty of Paying Living Expenses: Not hard at all  Food Insecurity: No Food Insecurity (  06/21/2022)   Hunger Vital Sign    Worried About Running Out of Food in the Last Year: Never true    Ran Out of Food in the Last Year: Never true  Transportation Needs: No Transportation Needs (06/21/2022)   PRAPARE - Administrator, Civil Service (Medical): No    Lack of Transportation (Non-Medical): No  Physical Activity: Insufficiently Active (06/21/2022)   Exercise Vital Sign    Days of Exercise per Week: 3 days    Minutes of Exercise per Session: 30 min  Stress: No Stress Concern Present (06/21/2022)   [employer] of Occupational Health - Occupational Stress Questionnaire    Feeling of Stress : Not at all  Social Connections: Moderately Integrated (06/21/2022)   Social Connection and Isolation Panel [NHANES]    Frequency of  Communication with Friends and Family: More than three times a week    Frequency of Social Gatherings with Friends and Family: More than three times a week    Attends Religious Services: 1 to 4 times per year    Active Member of [employer] or Organizations: Yes    Attends Banker Meetings: More than 4 times per year    Marital Status: Widowed   Outpatient Encounter Medications as of 01/04/2023  Medication Sig   apixaban (ELIQUIS) 2.5 MG TABS tablet Take 1 tablet (2.5 mg total) by mouth 2 (two) times daily.   [employer] D3 125 MCG (5000 UT) capsule TAKE 1 CAPSULE BY MOUTH EVERY DAY   diltiazem (CARDIZEM) 30 MG tablet Take 1 tablet (30 mg total) by mouth every 6 (six) hours as needed (for rapid heart rate).   furosemide (LASIX) 20 MG tablet Take 1 tablet (20 mg total) by mouth daily. (Patient not taking: Reported on 01/04/2023)   HYDROcodone-acetaminophen (NORCO) 5-325 MG tablet Take 1 tablet by mouth every 6 (six) hours as needed for moderate pain. (Patient not taking: Reported on 12/14/2022)   HYDROcodone-acetaminophen (NORCO) 5-325 MG tablet Take 1 tablet by mouth every 6 (six) hours as needed for moderate pain.   hydrocortisone 2.5 % cream APPLY TO AFFECTED AREA TWICE A DAY (Patient taking differently: Apply 1 Application topically daily as needed (itching).)   isosorbide mononitrate (IMDUR) 60 MG 24 hr tablet TAKE 1 TABLET (60 MG TOTAL) DAILY BY MOUTH.   levothyroxine (SYNTHROID) 75 MCG tablet Take 1 tablet (75 mcg total) by mouth daily before breakfast.   loratadine (CLARITIN) 10 MG tablet TAKE 1 TABLET BY MOUTH EVERY DAY   methocarbamol (ROBAXIN) 500 MG tablet TAKE 1 TABLET (500 MG TOTAL) BY MOUTH EVERY 12 (TWELVE) HOURS AS NEEDED FOR MUSCLE SPASMS.   metoprolol tartrate (LOPRESSOR) 25 MG tablet Take 1 tablet (25 mg total) by mouth 2 (two) times daily.   polyethylene glycol (MIRALAX / GLYCOLAX) 17 g packet Take 17 g by mouth daily as needed for mild constipation.   potassium chloride  (KLOR-CON) 10 MEQ tablet Take 1 tablet (10 mEq total) by mouth daily.   rosuvastatin (CRESTOR) 20 MG tablet Take 1 tablet (20 mg total) by mouth daily.   senna (SENOKOT) 8.6 MG TABS tablet Take 1 tablet (8.6 mg total) by mouth 2 (two) times daily.   Zoledronic Acid (RECLAST IV) Inject into the vein. Once yearly   No facility-administered encounter medications on file as of 01/04/2023.   ALLERGIES: Allergies  Allergen Reactions   Sulfa Antibiotics Swelling   VACCINATION STATUS: Immunization History  Administered Date(s) Administered   Fluad Quad(high Dose 65+) 02/29/2020, 03/27/2021,  05/11/2022   Influenza, High Dose Seasonal PF 03/09/2016, 03/02/2017   Moderna Sars-Covid-2 Vaccination 01/02/2020, 01/30/2020   Pneumococcal Conjugate-13 01/11/2018    HPI  82 year old female with medical history as above. She is being seen in follow-up for management of her hypothyroidism and multinodular goiter.  She remains on levothyroxine 75 mcg p.o. daily before breakfast.  Her previsit thyroid function tests are consistent with appropriate replacement.     She is compliant and has no side effects.    - She is status post FNA 3 nodules in her thyroid benign findings.  Her previsit surveillance thyroid/neck ultrasound shows stable findings with no concerning nodules at this time.    -She was recently diagnosed with acute coronary syndrome on medical management. -Her previsit labs are consistent with appropriate replacement.  She has hyperlipidemia currently on Crestor 20 mg p.o. nightly. During her last visit, she was advised to hold her Lasix due to hypotension with blood pressure at 80/48.  She remains only on metoprolol 25 mg p.o. twice daily, her blood pressure today is 126/78.    - She denies family history of thyroid dysfunction. She feels occasional choking sensation in her neck, no recent voice change nor shortness of breath. She denies any exposure to neck radiation. - She denies any  history of smoking.  Review of Systems Limited as above.  Objective:    BP 126/78   Pulse 72   Ht 5\' 2"  (1.575 m)   Wt 111 lb 3.2 oz (50.4 kg)   BMI 20.34 kg/m   Wt Readings from Last 3 Encounters:  01/04/23 111 lb 3.2 oz (50.4 kg)  12/16/22 105 lb (47.6 kg)  12/14/22 105 lb 6.4 oz (47.8 kg)    Physical Exam   CMP     Component Value Date/Time   NA 143 12/21/2022 0829   K 4.4 12/21/2022 0829   CL 106 12/21/2022 0829   CO2 22 12/21/2022 0829   GLUCOSE 93 12/21/2022 0829   GLUCOSE 98 08/22/2022 1545   BUN 8 12/21/2022 0829   CREATININE 0.65 12/21/2022 0829   CREATININE 0.59 07/11/2014 0913   CALCIUM 9.8 12/21/2022 0829   PROT 6.8 12/21/2022 0829   ALBUMIN 4.3 12/21/2022 0829   AST 16 12/21/2022 0829   ALT 10 12/21/2022 0829   ALKPHOS 62 12/21/2022 0829   BILITOT 0.4 12/21/2022 0829   GFRNONAA >60 08/22/2022 1545   GFRAA 98 06/26/2020 1116   Recent Results (from the past 2160 hour(s))  Lipid panel     Status: None   Collection Time: 11/23/22 10:01 AM  Result Value Ref Range   Cholesterol, Total 116 100 - 199 mg/dL   Triglycerides 68 0 - 149 mg/dL   HDL 58 >40 mg/dL   VLDL Cholesterol Cal 14 5 - 40 mg/dL   LDL Chol Calc (NIH) 44 0 - 99 mg/dL   Chol/HDL Ratio 2.0 0.0 - 4.4 ratio    Comment:                                   T. Chol/HDL Ratio                                             Men  Women  1/2 Avg.Risk  3.4    3.3                                   Avg.Risk  5.0    4.4                                2X Avg.Risk  9.6    7.1                                3X Avg.Risk 23.4   11.0   TSH     Status: Abnormal   Collection Time: 11/23/22 10:01 AM  Result Value Ref Range   TSH 0.066 (L) 0.450 - 4.500 uIU/mL  T4, free     Status: Abnormal   Collection Time: 11/23/22 10:01 AM  Result Value Ref Range   Free T4 2.16 (H) 0.82 - 1.77 ng/dL  Cortisol-am, blood     Status: None   Collection Time: 12/21/22  8:29 AM  Result Value Ref  Range   Cortisol - AM 12.6 6.2 - 19.4 ug/dL  Comprehensive metabolic panel     Status: None   Collection Time: 12/21/22  8:29 AM  Result Value Ref Range   Glucose 93 70 - 99 mg/dL   BUN 8 8 - 27 mg/dL   Creatinine, Ser 4.25 0.57 - 1.00 mg/dL   eGFR 87 >95 GL/OVF/6.43   BUN/Creatinine Ratio 12 12 - 28   Sodium 143 134 - 144 mmol/L   Potassium 4.4 3.5 - 5.2 mmol/L   Chloride 106 96 - 106 mmol/L   CO2 22 20 - 29 mmol/L   Calcium 9.8 8.7 - 10.3 mg/dL   Total Protein 6.8 6.0 - 8.5 g/dL   Albumin 4.3 3.7 - 4.7 g/dL   Globulin, Total 2.5 1.5 - 4.5 g/dL   Bilirubin Total 0.4 0.0 - 1.2 mg/dL   Alkaline Phosphatase 62 44 - 121 IU/L   AST 16 0 - 40 IU/L   ALT 10 0 - 32 IU/L  TSH     Status: None   Collection Time: 12/21/22  8:29 AM  Result Value Ref Range   TSH 0.791 0.450 - 4.500 uIU/mL  T4, free     Status: None   Collection Time: 12/21/22  8:29 AM  Result Value Ref Range   Free T4 1.53 0.82 - 1.77 ng/dL   Fine-needle aspiration samples  of 3 nodules in her thyroid is benign  Repeat thyroid ultrasound on April 18, 2019: Right lobe 4 cm x 1.9 mm x 2.6 cm, left lobe 4.1 cm x 1.5 cm x 2.3 cm  There are small stable nodules 3 on the right and one on the left.  2 right-sided nodules and one nodule on the left were previously biopsied with benign findings.   Assessment & Plan:   1.  hypothyroidism 2. Multinodular goiter 3.  Hyperlipidemia 4.  Hypotension  -Her previsit thyroid function tests are consistent with appropriate replacement.  She is advised to continue levothyroxine 75 mcg p.o. daily before breakfast.    - We discussed about the correct intake of her thyroid hormone, on empty stomach at fasting, with water, separated by at least 30 minutes from breakfast and other medications,  and separated by more than 4 hours from calcium, iron,  multivitamins, acid reflux medications (PPIs). -Patient is made aware of the fact that thyroid hormone replacement is needed for life, dose  to be adjusted by periodic monitoring of thyroid function tests.  -  Prior Biopsy off  3 nodules is negative for malignancy.  She had stable findings on her surveillance thyroid ultrasound-right thyroid nodule has decreased in size with interval cystic changes which downgrades it from TR 4 to TR 2 category.  She will not need intervention at this time.   She is advised to continue Crestor 20 mg p.o. nightly.  She is advised to remain off of Lasix until she sees her cardiologist or PCP. She is advised to continue metoprolol 50 mg p.o. twice daily.    I lowered her metoprolol to 25 mg p.o. twice daily.    -Patient takes opioids for pain syndrome, at risk for adrenal insufficiency.  Her previsit a.m. cortisol is normal.   - I advised patient to maintain close follow up with Bennie Pierini, FNP for primary care needs.   I spent  25  minutes in the care of the patient today including review of labs from Thyroid Function, CMP, and other relevant labs ; imaging/biopsy records (current and previous including abstractions from other facilities); face-to-face time discussing  her lab results and symptoms, medications doses, her options of short and long term treatment based on the latest standards of care / guidelines;   and documenting the encounter.  Evelyn [name]  participated in the discussions, expressed understanding, and voiced agreement with the above plans.  All questions were answered to her satisfaction. she is encouraged to contact clinic should she have any questions or concerns prior to her return visit.   Follow up plan: Return in about 1 year (around 01/04/2024) for F/U with Pre-visit Labs.  Marquis Lunch, MD Phone: [phone]  Fax: [phone]  -  This note was partially dictated with voice recognition software. Similar sounding words can be transcribed inadequately or may not  be corrected upon review.  01/04/2023, 4:02 PM

## 2023-01-09 NOTE — Progress Notes (Unsigned)
  ERROR Nurse visit only.

## 2023-01-10 ENCOUNTER — Ambulatory Visit: Payer: Medicare HMO

## 2023-01-10 ENCOUNTER — Ambulatory Visit: Payer: Medicare HMO | Admitting: Adult Health

## 2023-01-10 ENCOUNTER — Encounter: Payer: Self-pay | Admitting: Adult Health

## 2023-01-10 VITALS — BP 118/76 | HR 59 | Ht 62.0 in | Wt 110.6 lb

## 2023-01-10 DIAGNOSIS — I10 Essential (primary) hypertension: Secondary | ICD-10-CM

## 2023-01-10 NOTE — Patient Instructions (Signed)
Medication Instructions:  No Changes *If you need a refill on your cardiac medications before your next appointment, please call your pharmacy*   Lab Work: No labs If you have labs (blood work) drawn today and your tests are completely normal, you will receive your results only by: MyChart Message (if you have MyChart) OR A paper copy in the mail If you have any lab test that is abnormal or we need to change your treatment, we will call you to review the results.   Testing/Procedures: No Testing   Follow-Up: At [facility], you and your health needs are our priority.  As part of our continuing mission to provide you with exceptional heart care, we have created designated Provider Care Teams.  These Care Teams include your primary Cardiologist (physician) and Advanced Practice Providers (APPs -  Physician Assistants and Nurse Practitioners) who all work together to provide you with the care you need, when you need it.  We recommend signing up for the patient portal called "MyChart".  Sign up information is provided on this After Visit Summary.  MyChart is used to connect with patients for Virtual Visits (Telemedicine).  Patients are able to view lab/test results, encounter notes, upcoming appointments, etc.  Non-urgent messages can be sent to your provider as well.   To learn more about what you can do with MyChart, go to [email or web address].    Your next appointment:   3 month(s)  Provider:   Joni Reining, DNP, ANP

## 2023-01-11 ENCOUNTER — Telehealth: Payer: Self-pay | Admitting: *Deleted

## 2023-01-11 NOTE — Telephone Encounter (Signed)
   Pre-operative Risk Assessment    Patient Name: Evelyn [name]  DOB: 07-09-1940 MRN: [id number]      Request for Surgical Clearance    Procedure:   Right L5-TFES1   Date of Surgery:  Clearance TBD                                 Surgeon:  Dr. Aileen Fass Surgeon's Group or Practice Name:  [facility]  Phone number:  [phone] Fax number:  [phone]   Type of Clearance Requested:   - Medical  - Pharmacy:  Hold Apixaban (Eliquis) 3 days prior.   Type of Anesthesia:  Not Indicated   Additional requests/questions:  Pt was seen 01/10/2023 with Joni Reining, NP.  Signed, Emmit Pomfret   01/11/2023, 12:03 PM

## 2023-01-12 NOTE — Telephone Encounter (Signed)
Call placed to [facility]. Per Morrie Sheldon, Injection is going to be done in the office and they don't use any anesthesia.

## 2023-01-12 NOTE — Telephone Encounter (Signed)
Evelyn [name],   You saw this patient on 12/14/2022. Will you please comment on medical clearance for L5 epidural injection? This will be done without anesthesia.   Please route your response to P CV DIV Preop. I will communicate with requesting office once you have given recommendations.   Thank you!  Carlos Levering, NP

## 2023-01-12 NOTE — Telephone Encounter (Signed)
Patient with diagnosis of atrial fibrillation on Eliquis for anticoagulation.    Procedure:   Right L5-TFES1    Date of Surgery:  Clearance TBD   CHA2DS2-VASc Score = 7   This indicates a 11.2% annual risk of stroke. The patient's score is based upon: CHF History: 0 HTN History: 1 Diabetes History: 0 Stroke History: 2 Vascular Disease History: 1 Age Score: 2 Gender Score: 1   Per chart has had multiple TIA, most recent 08/22/2022  CrCl 51 Platelet count 206  Per office protocol, patient can hold Eliquis for 3 days prior to procedure.   Patient will not need bridging with Lovenox (enoxaparin) around procedure.  **This guidance is not considered finalized until pre-operative APP has relayed final recommendations.**

## 2023-01-12 NOTE — Telephone Encounter (Signed)
Please advise holding Eliquis prior to L5 epidural injection.   Thank you!  DW

## 2023-01-12 NOTE — Telephone Encounter (Signed)
Pre-op team,   Please contact requesting office to clarify type of anesthesia for this procedure.   Thank you!  Etta Grandchild. Pattrick Bady, DNP, NP-C  01/12/2023, 7:59 AM [facility] [address] Office [phone] Fax [phone]

## 2023-01-13 NOTE — Telephone Encounter (Signed)
Pt needs her Hydrocodone Rx sent to [facility] in [place] because they have it in stock.

## 2023-01-13 NOTE — Telephone Encounter (Signed)
   Patient Name: Evelyn [name]  DOB: [birth year] MRN: [id number]  Primary Cardiologist: Thurmon Fair, MD  Chart reviewed as part of pre-operative protocol coverage. Pt was seen by Joni Reining, NP on 01/10/2023. Per Joni Reining, NP, "Mrs. Evelyn [name] is okay to proceed with spinal injection. Saw her on follow up to check EKG after reducing the metoprolol due to bradycardia. She has had improvement in HR and energy level without recurrent chest pain. Went to [employer] and walked without chest pain. She was doing well." Therefore, given past medical history and time since last visit, based on ACC/AHA guidelines, [name] is at acceptable risk for the planned procedure without further cardiovascular testing.  Per office protocol, patient can hold Eliquis for 3 days prior to procedure.   Patient will not need bridging with Lovenox (enoxaparin) around procedure. Please resume Eliquis as soon as possible postprocedure, at the discretion of the surgeon.    I will route this recommendation to the requesting party via Epic fax function and remove from pre-op pool.  Please call with questions.  Joylene Grapes, NP 01/13/2023, 8:09 AM

## 2023-01-14 MED ORDER — METOPROLOL TARTRATE 25 MG PO TABS: 25.0000 mg | ORAL_TABLET | Freq: Two times a day (BID) | ORAL | 3 refills | Status: DC

## 2023-01-14 MED ORDER — HYDROCODONE-ACETAMINOPHEN 5-325 MG PO TABS: 1.0000 | ORAL_TABLET | Freq: Four times a day (QID) | ORAL | 0 refills | Status: DC | PRN

## 2023-01-14 NOTE — Addendum Note (Signed)
Addended by: Bennie Pierini on: 01/14/2023 09:43 AM   Modules accepted: Orders

## 2023-01-18 MED ORDER — HYDROCODONE-ACETAMINOPHEN 5-325 MG PO TABS: 1.0000 | ORAL_TABLET | Freq: Four times a day (QID) | ORAL | 0 refills | Status: AC | PRN

## 2023-01-18 NOTE — Telephone Encounter (Signed)
Meds sent to [facility] pharmacy  Meds ordered this encounter  Medications   HYDROcodone-acetaminophen (NORCO) 5-325 MG tablet    Sig: Take 1 tablet by mouth every 6 (six) hours as needed for moderate pain.    Dispense:  30 tablet    Refill:  0    Order Specific Question:   Supervising Provider    Answer:   Nils Pyle [[id number]]   [name], FNP

## 2023-01-18 NOTE — Addendum Note (Signed)
Addended by: Bennie Pierini on: 01/18/2023 11:42 AM   Modules accepted: Orders

## 2023-01-18 NOTE — Telephone Encounter (Signed)
Patient aware and verbalizes understanding. 

## 2023-01-18 NOTE — Telephone Encounter (Signed)
Pt can't contact [facility] to see if they received her Hydrocodone Rx and says they have not received the Rx. Need to resend.

## 2023-02-07 ENCOUNTER — Telehealth: Payer: Self-pay | Admitting: Nurse Practitioner

## 2023-02-07 NOTE — Telephone Encounter (Signed)
  Prescription Request  02/07/2023  Is this a "Controlled Substance" medicine? yes  Have you seen your PCP in the last 2 weeks? LOV 7/12 NOV 10/4  If YES, route message to pool  -  If NO, patient needs to be scheduled for appointment.  What is the name of the medication or equipment? HYDROcodone-acetaminophen (NORCO) 5-325 MG tablet   Have you contacted your pharmacy to request a refill? yes   Which pharmacy would you like this sent to?  [facility] [place], [place] - [address] (Ph: [phone])      Patient notified that their request is being sent to the clinical staff for review and that they should receive a response within 2 business days.

## 2023-02-07 NOTE — Telephone Encounter (Signed)
Called pharmacy to check last time filled. Per pharmacy last filled for #30 on 8/30. Prescribed as q 6 hrs. She cannot fill next refill until 9/30. Is she only supposed to be taking once daily? Please advise

## 2023-02-08 NOTE — Telephone Encounter (Signed)
Cannot have until time

## 2023-02-08 NOTE — Telephone Encounter (Signed)
Patient notified and verbalized understanding. 

## 2023-02-17 ENCOUNTER — Emergency Department (HOSPITAL_COMMUNITY): Payer: Medicare HMO

## 2023-02-17 ENCOUNTER — Other Ambulatory Visit: Payer: Self-pay

## 2023-02-17 ENCOUNTER — Observation Stay (HOSPITAL_COMMUNITY)
Admission: EM | Admit: 2023-02-17 | Discharge: 2023-02-18 | Disposition: A | Discharge status: Home Health | Payer: Medicare HMO | Attending: Family Medicine | Admitting: Family Medicine

## 2023-02-17 DIAGNOSIS — Z7901 Long term (current) use of anticoagulants: Secondary | ICD-10-CM | POA: Diagnosis not present

## 2023-02-17 DIAGNOSIS — S022XXA Fracture of nasal bones, initial encounter for closed fracture: Secondary | ICD-10-CM

## 2023-02-17 DIAGNOSIS — Z955 Presence of coronary angioplasty implant and graft: Secondary | ICD-10-CM | POA: Diagnosis not present

## 2023-02-17 DIAGNOSIS — I10 Essential (primary) hypertension: Secondary | ICD-10-CM | POA: Diagnosis present

## 2023-02-17 DIAGNOSIS — Y92009 Unspecified place in unspecified non-institutional (private) residence as the place of occurrence of the external cause: Secondary | ICD-10-CM

## 2023-02-17 DIAGNOSIS — M6281 Muscle weakness (generalized): Secondary | ICD-10-CM | POA: Insufficient documentation

## 2023-02-17 DIAGNOSIS — I2510 Atherosclerotic heart disease of native coronary artery without angina pectoris: Secondary | ICD-10-CM

## 2023-02-17 DIAGNOSIS — S0231XA Fracture of orbital floor, right side, initial encounter for closed fracture: Secondary | ICD-10-CM

## 2023-02-17 DIAGNOSIS — R2681 Unsteadiness on feet: Secondary | ICD-10-CM | POA: Diagnosis not present

## 2023-02-17 DIAGNOSIS — R55 Syncope and collapse: Secondary | ICD-10-CM | POA: Diagnosis not present

## 2023-02-17 DIAGNOSIS — I482 Chronic atrial fibrillation, unspecified: Secondary | ICD-10-CM

## 2023-02-17 DIAGNOSIS — I480 Paroxysmal atrial fibrillation: Secondary | ICD-10-CM | POA: Insufficient documentation

## 2023-02-17 DIAGNOSIS — S02401A Maxillary fracture, unspecified, initial encounter for closed fracture: Secondary | ICD-10-CM | POA: Diagnosis not present

## 2023-02-17 DIAGNOSIS — E782 Mixed hyperlipidemia: Secondary | ICD-10-CM

## 2023-02-17 DIAGNOSIS — W19XXXA Unspecified fall, initial encounter: Principal | ICD-10-CM

## 2023-02-17 DIAGNOSIS — R2689 Other abnormalities of gait and mobility: Secondary | ICD-10-CM | POA: Insufficient documentation

## 2023-02-17 DIAGNOSIS — I25718 Atherosclerosis of autologous vein coronary artery bypass graft(s) with other forms of angina pectoris: Secondary | ICD-10-CM

## 2023-02-17 DIAGNOSIS — S61216A Laceration without foreign body of right little finger without damage to nail, initial encounter: Secondary | ICD-10-CM | POA: Insufficient documentation

## 2023-02-17 DIAGNOSIS — E039 Hypothyroidism, unspecified: Secondary | ICD-10-CM

## 2023-02-17 LAB — COMPREHENSIVE METABOLIC PANEL
ALT: 16 U/L (ref 0–44)
AST: 21 U/L (ref 15–41)
Albumin: 3.6 g/dL (ref 3.5–5.0)
Alkaline Phosphatase: 45 U/L (ref 38–126)
Anion gap: 12 (ref 5–15)
BUN: 12 mg/dL (ref 8–23)
CO2: 20 mmol/L — ABNORMAL LOW (ref 22–32)
Calcium: 8.6 mg/dL — ABNORMAL LOW (ref 8.9–10.3)
Chloride: 106 mmol/L (ref 98–111)
Creatinine, Ser: 0.72 mg/dL (ref 0.44–1.00)
GFR, Estimated: >60 mL/min (ref >60)
Glucose, Bld: 111 mg/dL — ABNORMAL HIGH (ref 70–99)
Potassium: 3.5 mmol/L (ref 3.5–5.1)
Sodium: 138 mmol/L (ref 135–145)
Total Bilirubin: 0.4 mg/dL (ref 0.3–1.2)
Total Protein: 6.7 g/dL (ref 6.5–8.1)

## 2023-02-17 LAB — URINALYSIS, ROUTINE W REFLEX MICROSCOPIC
Bacteria, UA: NONE SEEN
Bilirubin Urine: NEGATIVE
Glucose, UA: NEGATIVE mg/dL
Ketones, ur: 5 mg/dL — AB
Leukocytes,Ua: NEGATIVE
Nitrite: NEGATIVE
Protein, ur: NEGATIVE mg/dL
Specific Gravity, Urine: 1.01 (ref 1.005–1.030)
pH: 6 (ref 5.0–8.0)

## 2023-02-17 LAB — CBC
HCT: 37.7 % (ref 36.0–46.0)
Hemoglobin: 12.5 g/dL (ref 12.0–15.0)
MCH: 30.9 pg (ref 26.0–34.0)
MCHC: 33.2 g/dL (ref 30.0–36.0)
MCV: 93.3 fL (ref 80.0–100.0)
Platelets: 199 10*3/uL (ref 150–400)
RBC: 4.04 MIL/uL (ref 3.87–5.11)
RDW: 14.9 % (ref 11.5–15.5)
WBC: 7.9 10*3/uL (ref 4.0–10.5)
nRBC: 0 % (ref 0.0–0.2)

## 2023-02-17 LAB — TROPONIN I (HIGH SENSITIVITY)
Troponin I (High Sensitivity): 9 ng/L (ref <18)
Troponin I (High Sensitivity): 13 ng/L (ref <18)

## 2023-02-17 MED ORDER — ONDANSETRON HCL 4 MG/2ML IJ SOLN
4.0000 mg | Freq: Once | INTRAMUSCULAR | Status: AC
  Administered 2023-02-17: 4 mg via INTRAVENOUS
  Filled 2023-02-17: qty 2

## 2023-02-17 MED ORDER — LIDOCAINE HCL (PF) 1 % IJ SOLN
5.0000 mL | Freq: Once | INTRAMUSCULAR | Status: AC
  Administered 2023-02-17: 5 mL
  Filled 2023-02-17: qty 5

## 2023-02-17 NOTE — ED Notes (Signed)
Patient transported to X-ray 

## 2023-02-17 NOTE — ED Provider Notes (Signed)
[place] EMERGENCY DEPARTMENT AT [facility] Provider Note   CSN: [id number] Arrival date & time: 02/17/23  1719     History  Chief Complaint  Patient presents with   Evelyn [name] is a 82 y.o. female.  Pt is a 82 yo female with pmhx significant for hypothyroidism, hld, cad, gerd, afib (on Eliquis).  Pt said she was doing errands all day and developed a headache.  She last remembers getting groceries out of the car and then woke up on the ground.  The pt did hit the right side of her face and has pain in her right hand and pinky.  She did receive morphine by EMS and pain has improved.         Home Medications Prior to Admission medications   Medication Sig Start Date End Date Taking? Authorizing Provider  apixaban (ELIQUIS) 2.5 MG TABS tablet Take 1 tablet (2.5 mg total) by mouth 2 (two) times daily. 08/23/22   Bennie Pierini, FNP  [facility] D3 125 MCG (5000 UT) capsule TAKE 1 CAPSULE BY MOUTH EVERY DAY 09/14/22   Roma Kayser, MD  diltiazem (CARDIZEM) 30 MG tablet Take 1 tablet (30 mg total) by mouth every 6 (six) hours as needed (for rapid heart rate). 08/23/22   Daphine Deutscher Mary-Margaret, FNP  furosemide (LASIX) 20 MG tablet Take 1 tablet (20 mg total) by mouth daily. 08/23/22   Daphine Deutscher Mary-Margaret, FNP  HYDROcodone-acetaminophen (NORCO) 5-325 MG tablet Take 1 tablet by mouth every 6 (six) hours as needed for moderate pain. 01/18/23 02/17/23  Daphine Deutscher, Mary-Margaret, FNP  hydrocortisone 2.5 % cream APPLY TO AFFECTED AREA TWICE A DAY Patient taking differently: Apply 1 Application topically daily as needed (itching). 11/13/20   Daphine Deutscher Mary-Margaret, FNP  isosorbide mononitrate (IMDUR) 60 MG 24 hr tablet TAKE 1 TABLET (60 MG TOTAL) DAILY BY MOUTH. 08/23/22   Daphine Deutscher, Mary-Margaret, FNP  levothyroxine (SYNTHROID) 75 MCG tablet Take 1 tablet (75 mcg total) by mouth daily before breakfast. 11/25/22   Nida, Denman George, MD  loratadine (CLARITIN) 10 MG tablet TAKE 1  TABLET BY MOUTH EVERY DAY 11/13/20   Daphine Deutscher, Mary-Margaret, FNP  methocarbamol (ROBAXIN) 500 MG tablet TAKE 1 TABLET (500 MG TOTAL) BY MOUTH EVERY 12 (TWELVE) HOURS AS NEEDED FOR MUSCLE SPASMS. 11/03/21   Daphine Deutscher Mary-Margaret, FNP  metoprolol tartrate (LOPRESSOR) 25 MG tablet Take 1 tablet (25 mg total) by mouth 2 (two) times daily. 01/14/23   Daphine Deutscher, Mary-Margaret, FNP  polyethylene glycol (MIRALAX / GLYCOLAX) 17 g packet Take 17 g by mouth daily as needed for mild constipation. 11/25/21   Kathlen Mody, MD  potassium chloride (KLOR-CON) 10 MEQ tablet Take 1 tablet (10 mEq total) by mouth daily. 08/23/22 08/18/23  Daphine Deutscher Mary-Margaret, FNP  rosuvastatin (CRESTOR) 20 MG tablet Take 1 tablet (20 mg total) by mouth daily. 08/23/22   Daphine Deutscher, Mary-Margaret, FNP  senna (SENOKOT) 8.6 MG TABS tablet Take 1 tablet (8.6 mg total) by mouth 2 (two) times daily. 11/25/21   Kathlen Mody, MD  Zoledronic Acid (RECLAST IV) Inject into the vein. Once yearly    [provider]      Allergies    Sulfa antibiotics    Review of Systems   Review of Systems  HENT:  Positive for facial swelling.   Musculoskeletal:        Right hand pain  All other systems reviewed and are negative.   Physical Exam Updated Vital Signs BP (!) 145/79   Pulse  93   Temp 98 F (36.7 C)   Resp 16   SpO2 91%  Physical Exam Vitals and nursing note reviewed.  Constitutional:      Appearance: Normal appearance.  HENT:     Head: Normocephalic.     Comments: Bruising and pain to right cheek    Right Ear: External ear normal.     Left Ear: External ear normal.     Nose: Nose normal.     Mouth/Throat:     Mouth: Mucous membranes are moist.     Pharynx: Oropharynx is clear.  Eyes:     Extraocular Movements: Extraocular movements intact.     Conjunctiva/sclera: Conjunctivae normal.     Pupils: Pupils are equal, round, and reactive to light.  Cardiovascular:     Rate and Rhythm: Normal rate and regular rhythm.     Pulses:  Normal pulses.     Heart sounds: Normal heart sounds.  Pulmonary:     Effort: Pulmonary effort is normal.     Breath sounds: Normal breath sounds.  Abdominal:     General: Abdomen is flat. Bowel sounds are normal.     Palpations: Abdomen is soft.  Musculoskeletal:     Cervical back: Normal range of motion and neck supple.     Comments: Right pinky finger pain  Skin:    General: Skin is warm.     Capillary Refill: Capillary refill takes less than 2 seconds.  Neurological:     General: No focal deficit present.     Mental Status: She is alert and oriented to person, place, and time.  Psychiatric:        Mood and Affect: Mood normal.        Behavior: Behavior normal.     ED Results / Procedures / Treatments   Labs (all labs ordered are listed, but only abnormal results are displayed) Labs Reviewed  COMPREHENSIVE METABOLIC PANEL - Abnormal; Notable for the following components:      Result Value   CO2 20 (*)    Glucose, Bld 111 (*)    Calcium 8.6 (*)    All other components within normal limits  URINALYSIS, ROUTINE W REFLEX MICROSCOPIC - Abnormal; Notable for the following components:   Hgb urine dipstick MODERATE (*)    Ketones, ur 5 (*)    All other components within normal limits  CBC  TROPONIN I (HIGH SENSITIVITY)  TROPONIN I (HIGH SENSITIVITY)    EKG EKG Interpretation Date/Time:  Thursday February 17 2023 18:52:38 EDT Ventricular Rate:  62 PR Interval:  176 QRS Duration:  102 QT Interval:  404 QTC Calculation: 411 R Axis:   44  Text Interpretation: Sinus rhythm Probable left atrial enlargement Borderline repolarization abnormality Confirmed by Jacalyn Lefevre [phone]) on 02/17/2023 7:17:59 PM  Radiology DG Foot Complete Left  Result Date: 02/17/2023 CLINICAL DATA:  Pain. EXAM: LEFT FOOT - COMPLETE 3+ VIEW COMPARISON:  None Available. FINDINGS: Postop changes consistent with bunion repair including a healed first metatarsal osteotomy and K-wire fixation. No  evidence of acute fracture or dislocation in the left foot. No focal bone lesion or bone destruction. Mild medial subluxation at the second metatarsal-phalangeal joint is nonspecific but likely chronic. Soft tissues are unremarkable. IMPRESSION: No acute bony abnormalities. Old left first metatarsal bunion repair. Subluxation at the second metatarsal-phalangeal joint is likely chronic. Electronically Signed   By: Burman Nieves M.D.   On: 02/17/2023 22:34   DG Ankle Complete Left  Result Date: 02/17/2023 CLINICAL  DATA:  Pain. EXAM: LEFT ANKLE COMPLETE - 3+ VIEW COMPARISON:  None Available. FINDINGS: There is no evidence of fracture, dislocation, or joint effusion. There is no evidence of arthropathy or other focal bone abnormality. Soft tissues are unremarkable. IMPRESSION: Negative. Electronically Signed   By: Burman Nieves M.D.   On: 02/17/2023 22:33   CT Maxillofacial Wo Contrast  Result Date: 02/17/2023 CLINICAL DATA:  Blunt facial trauma.  Syncope with fall.  Headache. EXAM: CT MAXILLOFACIAL WITHOUT CONTRAST TECHNIQUE: Multidetector CT imaging of the maxillofacial structures was performed. Multiplanar CT image reconstructions were also generated. RADIATION DOSE REDUCTION: This exam was performed according to the departmental dose-optimization program which includes automated exposure control, adjustment of the mA and/or kV according to patient size and/or use of iterative reconstruction technique. COMPARISON:  CT head 08/22/2022 FINDINGS: Osseous: Comminuted and mildly depressed fractures of the anterior nasal bones. Nasal septum appears intact. Fractures of the anterior, lateral, and medial wall of the right maxillary antrum with mild depression of the fracture fragments. Nondepressed fractures of the right inferior orbital wall. Degenerative changes in the temporomandibular joints. Orbits: The globes and extraocular muscles appear intact and symmetrical. Small amount of extraconal gas along the  inferior and medial aspect of the right orbit. No retrobulbar infiltration. Sinuses: Opacification and air-fluid levels in the right maxillary antrum and right sphenoid sinus. Mastoid air cells are clear. Soft tissues: Mild soft tissue swelling/hematoma and soft tissue gas inferior to the right orbit. Limited intracranial: See additional report of CT head. IMPRESSION: 1. Comminuted and mildly depressed anterior nasal bone fractures. 2. Fractures of the anterior, lateral, and medial right maxillary antral wall with mild depression of fracture fragments. Associated air-fluid levels in the right paranasal sinuses. 3. Nondepressed fractures of the right inferior orbital wall. Associated extraconal gas in the right orbit. Small soft tissue hematoma and gas in the subcutaneous fat inferior to the right orbit. Electronically Signed   By: Burman Nieves M.D.   On: 02/17/2023 19:50   CT CERVICAL SPINE WO CONTRAST  Result Date: 02/17/2023 CLINICAL DATA:  Poly trauma, blunt. Syncopal episode with fall. Headache. EXAM: CT CERVICAL SPINE WITHOUT CONTRAST TECHNIQUE: Multidetector CT imaging of the cervical spine was performed without intravenous contrast. Multiplanar CT image reconstructions were also generated. RADIATION DOSE REDUCTION: This exam was performed according to the departmental dose-optimization program which includes automated exposure control, adjustment of the mA and/or kV according to patient size and/or use of iterative reconstruction technique. COMPARISON:  Cervical spine radiographs 06/19/2019. CT cervical spine 05/14/2018 FINDINGS: Alignment: Normal alignment. Skull base and vertebrae: Skull base appears intact. No vertebral compression deformities. No focal bone lesion or bone destruction. Soft tissues and spinal canal: No prevertebral soft tissue swelling. No abnormal paraspinal soft tissue mass or infiltration. Disc levels: Postoperative changes with anterior plate and screw fixation and  intervertebral fusion from C4 through C6. Degenerative changes in the remaining interspace levels with prominent osteophyte formation. Prominent degenerative changes in the facet joints. Uncovertebral and facet joint osteophytes cause bone encroachment upon the neural foramina bilaterally. Upper chest: Left apical calcification. Lung apices are clear. Partially calcified left thyroid gland nodules, largest measuring 1.7 cm diameter. No change since prior study from 10/06/2016. Other: None. IMPRESSION: 1. Normal alignment of the cervical spine. No acute displaced fractures identified. 2. Anterior fixation and intervertebral fusion from C4 through C6. Degenerative changes within the remainder of the cervical spine and in the facet joints. 3. Calcified left thyroid gland nodule. No change since previous study.  Stability for greater than 5 years implies benignity; no biopsy or followup indicated (ref: J Am Coll Radiol. 2015 Feb;12(2): 143-50). Electronically Signed   By: Burman Nieves M.D.   On: 02/17/2023 19:46   CT HEAD WO CONTRAST  Result Date: 02/17/2023 CLINICAL DATA:  Head trauma, moderate to severe. Syncope with fall. Headache. EXAM: CT HEAD WITHOUT CONTRAST TECHNIQUE: Contiguous axial images were obtained from the base of the skull through the vertex without intravenous contrast. RADIATION DOSE REDUCTION: This exam was performed according to the departmental dose-optimization program which includes automated exposure control, adjustment of the mA and/or kV according to patient size and/or use of iterative reconstruction technique. COMPARISON:  MRI brain 09/23/2022.  CT head 08/22/2022 FINDINGS: Brain: No evidence of acute infarction, hemorrhage, hydrocephalus, extra-axial collection or mass lesion/mass effect. Mild diffuse cerebral atrophy. Mild low-attenuation changes in the deep white matter consistent with small vessel ischemia. Vascular: No hyperdense vessel or unexpected calcification. Skull:  Calvarium appears intact. Sinuses/Orbits: See additional report of CT maxillofacial. Other: None. IMPRESSION: No acute intracranial abnormalities. Mild diffuse cerebral atrophy and small vessel ischemic changes. Electronically Signed   By: Burman Nieves M.D.   On: 02/17/2023 19:40   DG Pelvis Portable  Result Date: 02/17/2023 CLINICAL DATA:  Trauma.  Syncope with fall. EXAM: PORTABLE PELVIS 1-2 VIEWS COMPARISON:  None Available. FINDINGS: Degenerative changes in the lower lumbar spine. Pelvis and visualized hips appear intact. No acute displaced fractures identified. No focal bone lesion or bone destruction. SI joints and symphysis pubis are not displaced. Soft tissues are unremarkable. IMPRESSION: No acute bony abnormalities.  Mild degenerative changes. Electronically Signed   By: Burman Nieves M.D.   On: 02/17/2023 19:37   DG Chest Port 1 View  Result Date: 02/17/2023 CLINICAL DATA:  Trauma.  Syncope and fall. EXAM: PORTABLE CHEST 1 VIEW COMPARISON:  11/30/2021 FINDINGS: Postoperative changes in the mediastinum and lower cervical spine. A loop recorder is present. Mild cardiac enlargement. Lungs are clear. No pleural effusions. No pneumothorax. Mediastinal contours appear intact. Calcified and tortuous aorta. Degenerative changes in the spine and shoulders. IMPRESSION: Cardiac enlargement.  No evidence of active pulmonary disease. Electronically Signed   By: Burman Nieves M.D.   On: 02/17/2023 19:35   DG Hand Complete Right  Result Date: 02/17/2023 CLINICAL DATA:  Right hand pain after a fall. EXAM: RIGHT HAND - COMPLETE 3+ VIEW COMPARISON:  None Available. FINDINGS: Prominent diffuse degenerative changes throughout the interphalangeal joints as well as in the STT joints of the right wrist. No evidence of acute fracture or dislocation. No focal bone lesion or bone destruction. Soft tissues are unremarkable. IMPRESSION: Prominent degenerative changes in the right hand. No acute bony  abnormalities. Electronically Signed   By: Burman Nieves M.D.   On: 02/17/2023 19:34    Procedures Procedures    Medications Ordered in ED Medications  lidocaine (PF) (XYLOCAINE) 1 % injection 5 mL (5 mLs Infiltration Given by Other 02/17/23 2118)  ondansetron (ZOFRAN) injection 4 mg (4 mg Intravenous Given 02/17/23 2158)    ED Course/ Medical Decision Making/ A&P                                 Medical Decision Making Amount and/or Complexity of Data Reviewed Labs: ordered. Radiology: ordered.  Risk Prescription drug management. Decision regarding hospitalization.   This patient presents to the ED for concern of fall, this involves an extensive number of  treatment options, and is a complaint that carries with it a high risk of complications and morbidity.  The differential diagnosis includes multiple trauma   Co morbidities that complicate the patient evaluation  hypothyroidism, hld, cad, gerd, afib (on Eliquis)   Additional history obtained:  Additional history obtained from epic chart review External records from outside source obtained and reviewed including EMS report   Lab Tests:  I Ordered, and personally interpreted labs.  The pertinent results include:  cbc nl, cmp nl, ua + ketones, trop nl   Imaging Studies ordered:  I ordered imaging studies including ct head, c-spine, face, chest, pelvis, hand  I independently visualized and interpreted imaging which showed  R hand: Prominent degenerative changes in the right hand. No acute bony  abnormalities.  CXR: Cardiac enlargement.  No evidence of active pulmonary disease.  Pelvis: No acute bony abnormalities.  Mild degenerative changes.  CT head: No acute intracranial abnormalities. Mild diffuse cerebral atrophy  and small vessel ischemic changes.  CT cervical spine:  Normal alignment of the cervical spine. No acute displaced  fractures identified.  2. Anterior fixation and intervertebral fusion from  C4 through C6.  Degenerative changes within the remainder of the cervical spine and  in the facet joints.  3. Calcified left thyroid gland nodule. No change since previous  study. Stability for greater than 5 years implies benignity; no  biopsy or followup indicated (ref: J Am Coll Radiol. 2015 Feb;12(2):  143-50).  CT face:  Comminuted and mildly depressed anterior nasal bone fractures.  2. Fractures of the anterior, lateral, and medial right maxillary  antral wall with mild depression of fracture fragments. Associated  air-fluid levels in the right paranasal sinuses.  3. Nondepressed fractures of the right inferior orbital wall.  Associated extraconal gas in the right orbit. Small soft tissue  hematoma and gas in the subcutaneous fat inferior to the right  orbit.  L foot: No acute bony abnormalities. Old left first metatarsal bunion  repair. Subluxation at the second metatarsal-phalangeal joint is  likely chronic.  L ankle: Negative.  I agree with the radiologist interpretation   Cardiac Monitoring:  The patient was maintained on a cardiac monitor.  I personally viewed and interpreted the cardiac monitored which showed an underlying rhythm of: nsr   Medicines ordered and prescription drug management:   I have reviewed the patients home medicines and have made adjustments as needed   Test Considered:  ct  Consultations Obtained:  I requested consultation with ENT (Dr. Jearld Fenton),  and discussed lab and imaging findings as well as pertinent plan -he recommended outpatient f/u for facial fx Pt d/w Dr. Thomes Dinning ([facility]) for admission   Problem List / ED Course:  Syncope:  etiology unclear.   Multiple facial fx:  none require emergent surgery.  F/u outpatient Finger lac:  sutured by PA Barrett   Reevaluation:  After the interventions noted above, I reevaluated the patient and found that they have :improved   Social Determinants of Health:  Lives at  home   Dispostion:  After consideration of the diagnostic results and the patients response to treatment, I feel that the patent would benefit from admission.          Final Clinical Impression(s) / ED Diagnoses Final diagnoses:  Fall, initial encounter  Closed fracture of nasal bone, initial encounter  Closed fracture of right orbital floor, initial encounter (HCC)  Closed fracture of maxillary sinus, initial encounter (HCC)  Syncope, unspecified syncope type  On  apixaban therapy  Laceration of right little finger without foreign body without damage to nail, initial encounter    Rx / DC Orders ED Discharge Orders     None         Jacalyn Lefevre, MD 02/17/23 2319

## 2023-02-17 NOTE — ED Notes (Signed)
Pt returned from radiology.

## 2023-02-17 NOTE — H&P (Signed)
History and Physical    Patient: Evelyn [name] NWG:[id number] DOB: December 11, 1940 DOA: 02/17/2023 DOS: the patient was seen and examined on 02/18/2023 PCP: Bennie Pierini, FNP  Patient coming from: Home  Chief Complaint:  Chief Complaint  Patient presents with   Fall   HPI: Evelyn [name] is an 82 y.o. female with medical history significant of hypertension, hyperlipidemia chronic atrial fibrillation on Eliquis, CAD status post stent placement, hypothyroidism, chronic pain due to chronic cervical disc disease and prior back and neck surgeries who presents to the emergency department from home via EMS due to syncopal-like event at home.  Patient states that while returning home from running errands, she felt some tightness across her chest and thought it was due to her A-fib.  On getting home, while taking groceries out of the car, she suspected that she probably passed out, because she suddenly woke up on the floor of the driveway.  She was unable to get up, one of her neighbors who saw her came to help and activated EMS.  Morphine was given en route and her pain has improved by the time she gets to the ED.  ED Course:  In the emergency department, BP was 174/76, other vital signs were within normal range.  Workup in the ED showed normal CBC, BMP was normal except for bicarb of 20 and blood glucose of 111.  Troponin x 2 was negative.  Urinalysis was normal. CT maxillofacial without contrast showed comminuted and mildly depressed anterior nasal bone fractures.  Fractures of the anterior, lateral and medial right maxillary antral wall with mild depression of fracture fragments. Associated air-fluid levels in the right paranasal sinuses. 3. Nondepressed fractures of the right inferior orbital wall. Associated extraconal gas in the right orbit. Small soft tissue hematoma and gas in the subcutaneous fat inferior to the right orbit. Other imaging studies including right hand, pelvic, left foot  x-rays showed no acute fracture IV Zofran was given. ENT on-call (Dr. Jearld Fenton) was consulted and recommended no indication for any surgical intervention at this time and the patient can follow-up as an outpatient for facial fracture per ED medical record. Hospitalist was asked to admit patient for further evaluation and management.  Review of Systems: Review of systems as noted in the HPI. All other systems reviewed and are negative.   Past Medical History:  Diagnosis Date   Aneurysm [facility])    Aortic insufficiency    Atrial fibrillation (HCC)    CAD (coronary artery disease)    CABG LIMA to LAD,vein graft to diagonal,vein graft to OM   Dyslipidemia    Essential hypertension 09/16/2013   GERD (gastroesophageal reflux disease)    Hypothyroid    Past Surgical History:  Procedure Laterality Date   ABDOMINAL HYSTERECTOMY     BACK SURGERY  2009   BACK SURGERY     CORONARY ANGIOPLASTY  2003   cutting balloon atherectomy in-stent stenosis of the LAD.   CORONARY ANGIOPLASTY WITH STENT PLACEMENT  2002   remote PCI & stenting LAD,   CORONARY ARTERY BYPASS GRAFT  2004   LIMA to LAD,vein graft to diagonal,vein graft to OM   FOOT SURGERY     KYPHOPLASTY N/A 11/24/2021   Procedure: LUMBAR ONE KYPHOPLASTY;  Surgeon: Barnett Abu, MD;  Location: [facility] OR;  Service: Neurosurgery;  Laterality: N/A;  LUMBAR ONE KYPHOPLASTY   LEFT HEART CATH AND CORS/GRAFTS ANGIOGRAPHY N/A 02/23/2017   Procedure: LEFT HEART CATH AND CORS/GRAFTS ANGIOGRAPHY;  Surgeon: Iran Ouch, MD;  Location: [facility] INVASIVE CV LAB;  Service: Cardiovascular;  Laterality: N/A;   LEFT HEART CATHETERIZATION WITH CORONARY ANGIOGRAM N/A 12/15/2011   Procedure: LEFT HEART CATHETERIZATION WITH CORONARY ANGIOGRAM;  Surgeon: Thurmon Fair, MD;  Location: [facility] CATH LAB;  Service: Cardiovascular;  Laterality: N/A;   LOOP RECORDER INSERTION N/A 06/14/2018   Procedure: LOOP RECORDER INSERTION;  Surgeon: Thurmon Fair, MD;  Location: [facility]  INVASIVE CV LAB;  Service: Cardiovascular;  Laterality: N/A;   NECK SURGERY     NM MYOCAR PERF WALL MOTION  12/01/2011   normal study-persistent exercise induced ECG changes raises the concern for ischemia (balanced ischemia).   NOSE SURGERY     PACEMAKER INSERTION     NOT PACEMAKER - LOOP RECORDER   TONSILLECTOMY  1948   TUBAL LIGATION      Social History:  reports that she has never smoked. She has never used smokeless tobacco. She reports that she does not drink alcohol and does not use drugs.   Allergies  Allergen Reactions   Sulfa Antibiotics Swelling    Family History  Problem Relation Age of Onset   Heart attack Mother    Heart failure Father    Hypertension Father    Heart disease Father    Asthma Maternal Grandmother    Heart disease Sister    Stroke Brother    Voice disorder Daughter        vocal dystonia   Stroke Daughter    Stroke Son    Cancer Sister 52     Prior to Admission medications   Medication Sig Start Date End Date Taking? Authorizing Provider  apixaban (ELIQUIS) 2.5 MG TABS tablet Take 1 tablet (2.5 mg total) by mouth 2 (two) times daily. 08/23/22   Bennie Pierini, FNP  [facility] D3 125 MCG (5000 UT) capsule TAKE 1 CAPSULE BY MOUTH EVERY DAY 09/14/22   Roma Kayser, MD  diltiazem (CARDIZEM) 30 MG tablet Take 1 tablet (30 mg total) by mouth every 6 (six) hours as needed (for rapid heart rate). 08/23/22   Daphine Deutscher Mary-Margaret, FNP  furosemide (LASIX) 20 MG tablet Take 1 tablet (20 mg total) by mouth daily. 08/23/22   Daphine Deutscher, Mary-Margaret, FNP  hydrocortisone 2.5 % cream APPLY TO AFFECTED AREA TWICE A DAY Patient taking differently: Apply 1 Application topically daily as needed (itching). 11/13/20   Daphine Deutscher Mary-Margaret, FNP  isosorbide mononitrate (IMDUR) 60 MG 24 hr tablet TAKE 1 TABLET (60 MG TOTAL) DAILY BY MOUTH. 08/23/22   Daphine Deutscher, Mary-Margaret, FNP  levothyroxine (SYNTHROID) 75 MCG tablet Take 1 tablet (75 mcg total) by mouth daily before  breakfast. 11/25/22   Nida, Denman George, MD  loratadine (CLARITIN) 10 MG tablet TAKE 1 TABLET BY MOUTH EVERY DAY 11/13/20   Daphine Deutscher, Mary-Margaret, FNP  methocarbamol (ROBAXIN) 500 MG tablet TAKE 1 TABLET (500 MG TOTAL) BY MOUTH EVERY 12 (TWELVE) HOURS AS NEEDED FOR MUSCLE SPASMS. 11/03/21   Daphine Deutscher Mary-Margaret, FNP  metoprolol tartrate (LOPRESSOR) 25 MG tablet Take 1 tablet (25 mg total) by mouth 2 (two) times daily. 01/14/23   Daphine Deutscher, Mary-Margaret, FNP  polyethylene glycol (MIRALAX / GLYCOLAX) 17 g packet Take 17 g by mouth daily as needed for mild constipation. 11/25/21   Kathlen Mody, MD  potassium chloride (KLOR-CON) 10 MEQ tablet Take 1 tablet (10 mEq total) by mouth daily. 08/23/22 08/18/23  Daphine Deutscher Mary-Margaret, FNP  rosuvastatin (CRESTOR) 20 MG tablet Take 1 tablet (20 mg total) by mouth daily. 08/23/22   Bennie Pierini, FNP  senna (SENOKOT) 8.6  MG TABS tablet Take 1 tablet (8.6 mg total) by mouth 2 (two) times daily. 11/25/21   Kathlen Mody, MD  Zoledronic Acid (RECLAST IV) Inject into the vein. Once yearly    [provider]    Physical Exam: BP (!) 145/79   Pulse 93   Temp 98 F (36.7 C)   Resp 16   SpO2 91%   General: 82 y.o. year-old female well developed well nourished in no acute distress.  Alert and oriented x3. HEENT: NCAT, EOMI, noted bruise on right cheek normal sinus rhythm at Neck: Supple, trachea medial Cardiovascular: Regular rate and rhythm with no rubs or gallops.  No thyromegaly or JVD noted.  No lower extremity edema. 2/4 pulses in all 4 extremities. Respiratory: Clear to auscultation with no wheezes or rales. Good inspiratory effort. Abdomen: Soft, nontender nondistended with normal bowel sounds x4 quadrants. Muskuloskeletal: No cyanosis, clubbing or edema noted bilaterally Neuro: CN II-XII intact, strength 5/5 x 4, sensation, reflexes intact Skin: No ulcerative lesions noted or rashes Psychiatry: Judgement and insight appear normal. Mood is  appropriate for condition and setting          Labs on Admission:  Basic Metabolic Panel: Recent Labs  Lab 02/17/23 1840  NA 138  K 3.5  CL 106  CO2 20*  GLUCOSE 111*  BUN 12  CREATININE 0.72  CALCIUM 8.6*   Liver Function Tests: Recent Labs  Lab 02/17/23 1840  AST 21  ALT 16  ALKPHOS 45  BILITOT 0.4  PROT 6.7  ALBUMIN 3.6   No results for input(s): "LIPASE", "AMYLASE" in the last 168 hours. No results for input(s): "AMMONIA" in the last 168 hours. CBC: Recent Labs  Lab 02/17/23 1840  WBC 7.9  HGB 12.5  HCT 37.7  MCV 93.3  PLT 199   Cardiac Enzymes: No results for input(s): "CKTOTAL", "CKMB", "CKMBINDEX", "TROPONINI" in the last 168 hours.  BNP (last 3 results) No results for input(s): "BNP" in the last 8760 hours.  ProBNP (last 3 results) No results for input(s): "PROBNP" in the last 8760 hours.  CBG: No results for input(s): "GLUCAP" in the last 168 hours.  Radiological Exams on Admission: DG Foot Complete Left  Result Date: 02/17/2023 CLINICAL DATA:  Pain. EXAM: LEFT FOOT - COMPLETE 3+ VIEW COMPARISON:  None Available. FINDINGS: Postop changes consistent with bunion repair including a healed first metatarsal osteotomy and K-wire fixation. No evidence of acute fracture or dislocation in the left foot. No focal bone lesion or bone destruction. Mild medial subluxation at the second metatarsal-phalangeal joint is nonspecific but likely chronic. Soft tissues are unremarkable. IMPRESSION: No acute bony abnormalities. Old left first metatarsal bunion repair. Subluxation at the second metatarsal-phalangeal joint is likely chronic. Electronically Signed   By: Burman Nieves M.D.   On: 02/17/2023 22:34   DG Ankle Complete Left  Result Date: 02/17/2023 CLINICAL DATA:  Pain. EXAM: LEFT ANKLE COMPLETE - 3+ VIEW COMPARISON:  None Available. FINDINGS: There is no evidence of fracture, dislocation, or joint effusion. There is no evidence of arthropathy or other focal  bone abnormality. Soft tissues are unremarkable. IMPRESSION: Negative. Electronically Signed   By: Burman Nieves M.D.   On: 02/17/2023 22:33   CT Maxillofacial Wo Contrast  Result Date: 02/17/2023 CLINICAL DATA:  Blunt facial trauma.  Syncope with fall.  Headache. EXAM: CT MAXILLOFACIAL WITHOUT CONTRAST TECHNIQUE: Multidetector CT imaging of the maxillofacial structures was performed. Multiplanar CT image reconstructions were also generated. RADIATION DOSE REDUCTION: This exam was  performed according to the departmental dose-optimization program which includes automated exposure control, adjustment of the mA and/or kV according to patient size and/or use of iterative reconstruction technique. COMPARISON:  CT head 08/22/2022 FINDINGS: Osseous: Comminuted and mildly depressed fractures of the anterior nasal bones. Nasal septum appears intact. Fractures of the anterior, lateral, and medial wall of the right maxillary antrum with mild depression of the fracture fragments. Nondepressed fractures of the right inferior orbital wall. Degenerative changes in the temporomandibular joints. Orbits: The globes and extraocular muscles appear intact and symmetrical. Small amount of extraconal gas along the inferior and medial aspect of the right orbit. No retrobulbar infiltration. Sinuses: Opacification and air-fluid levels in the right maxillary antrum and right sphenoid sinus. Mastoid air cells are clear. Soft tissues: Mild soft tissue swelling/hematoma and soft tissue gas inferior to the right orbit. Limited intracranial: See additional report of CT head. IMPRESSION: 1. Comminuted and mildly depressed anterior nasal bone fractures. 2. Fractures of the anterior, lateral, and medial right maxillary antral wall with mild depression of fracture fragments. Associated air-fluid levels in the right paranasal sinuses. 3. Nondepressed fractures of the right inferior orbital wall. Associated extraconal gas in the right orbit.  Small soft tissue hematoma and gas in the subcutaneous fat inferior to the right orbit. Electronically Signed   By: Burman Nieves M.D.   On: 02/17/2023 19:50   CT CERVICAL SPINE WO CONTRAST  Result Date: 02/17/2023 CLINICAL DATA:  Poly trauma, blunt. Syncopal episode with fall. Headache. EXAM: CT CERVICAL SPINE WITHOUT CONTRAST TECHNIQUE: Multidetector CT imaging of the cervical spine was performed without intravenous contrast. Multiplanar CT image reconstructions were also generated. RADIATION DOSE REDUCTION: This exam was performed according to the departmental dose-optimization program which includes automated exposure control, adjustment of the mA and/or kV according to patient size and/or use of iterative reconstruction technique. COMPARISON:  Cervical spine radiographs 06/19/2019. CT cervical spine 05/14/2018 FINDINGS: Alignment: Normal alignment. Skull base and vertebrae: Skull base appears intact. No vertebral compression deformities. No focal bone lesion or bone destruction. Soft tissues and spinal canal: No prevertebral soft tissue swelling. No abnormal paraspinal soft tissue mass or infiltration. Disc levels: Postoperative changes with anterior plate and screw fixation and intervertebral fusion from C4 through C6. Degenerative changes in the remaining interspace levels with prominent osteophyte formation. Prominent degenerative changes in the facet joints. Uncovertebral and facet joint osteophytes cause bone encroachment upon the neural foramina bilaterally. Upper chest: Left apical calcification. Lung apices are clear. Partially calcified left thyroid gland nodules, largest measuring 1.7 cm diameter. No change since prior study from 10/06/2016. Other: None. IMPRESSION: 1. Normal alignment of the cervical spine. No acute displaced fractures identified. 2. Anterior fixation and intervertebral fusion from C4 through C6. Degenerative changes within the remainder of the cervical spine and in the facet  joints. 3. Calcified left thyroid gland nodule. No change since previous study. Stability for greater than 5 years implies benignity; no biopsy or followup indicated (ref: J Am Coll Radiol. 2015 Feb;12(2): 143-50). Electronically Signed   By: Burman Nieves M.D.   On: 02/17/2023 19:46   CT HEAD WO CONTRAST  Result Date: 02/17/2023 CLINICAL DATA:  Head trauma, moderate to severe. Syncope with fall. Headache. EXAM: CT HEAD WITHOUT CONTRAST TECHNIQUE: Contiguous axial images were obtained from the base of the skull through the vertex without intravenous contrast. RADIATION DOSE REDUCTION: This exam was performed according to the departmental dose-optimization program which includes automated exposure control, adjustment of the mA and/or kV according to  patient size and/or use of iterative reconstruction technique. COMPARISON:  MRI brain 09/23/2022.  CT head 08/22/2022 FINDINGS: Brain: No evidence of acute infarction, hemorrhage, hydrocephalus, extra-axial collection or mass lesion/mass effect. Mild diffuse cerebral atrophy. Mild low-attenuation changes in the deep white matter consistent with small vessel ischemia. Vascular: No hyperdense vessel or unexpected calcification. Skull: Calvarium appears intact. Sinuses/Orbits: See additional report of CT maxillofacial. Other: None. IMPRESSION: No acute intracranial abnormalities. Mild diffuse cerebral atrophy and small vessel ischemic changes. Electronically Signed   By: Burman Nieves M.D.   On: 02/17/2023 19:40   DG Pelvis Portable  Result Date: 02/17/2023 CLINICAL DATA:  Trauma.  Syncope with fall. EXAM: PORTABLE PELVIS 1-2 VIEWS COMPARISON:  None Available. FINDINGS: Degenerative changes in the lower lumbar spine. Pelvis and visualized hips appear intact. No acute displaced fractures identified. No focal bone lesion or bone destruction. SI joints and symphysis pubis are not displaced. Soft tissues are unremarkable. IMPRESSION: No acute bony abnormalities.   Mild degenerative changes. Electronically Signed   By: Burman Nieves M.D.   On: 02/17/2023 19:37   DG Chest Port 1 View  Result Date: 02/17/2023 CLINICAL DATA:  Trauma.  Syncope and fall. EXAM: PORTABLE CHEST 1 VIEW COMPARISON:  11/30/2021 FINDINGS: Postoperative changes in the mediastinum and lower cervical spine. A loop recorder is present. Mild cardiac enlargement. Lungs are clear. No pleural effusions. No pneumothorax. Mediastinal contours appear intact. Calcified and tortuous aorta. Degenerative changes in the spine and shoulders. IMPRESSION: Cardiac enlargement.  No evidence of active pulmonary disease. Electronically Signed   By: Burman Nieves M.D.   On: 02/17/2023 19:35   DG Hand Complete Right  Result Date: 02/17/2023 CLINICAL DATA:  Right hand pain after a fall. EXAM: RIGHT HAND - COMPLETE 3+ VIEW COMPARISON:  None Available. FINDINGS: Prominent diffuse degenerative changes throughout the interphalangeal joints as well as in the STT joints of the right wrist. No evidence of acute fracture or dislocation. No focal bone lesion or bone destruction. Soft tissues are unremarkable. IMPRESSION: Prominent degenerative changes in the right hand. No acute bony abnormalities. Electronically Signed   By: Burman Nieves M.D.   On: 02/17/2023 19:34    EKG: I independently viewed the EKG done and my findings are as followed: Normal sinus rhythm at a rate of 62 bpm  Assessment/Plan Present on Admission:  Syncope  Mixed hyperlipidemia  Essential hypertension  CAD, PCI '02, '04. CABG X 3 2004  Principal Problem:   Syncope Active Problems:   CAD, PCI '02, '04. CABG X 3 2004   Essential hypertension   Mixed hyperlipidemia   Fall at home, initial encounter   Fracture of nasal bone   Fracture of right orbital floor (HCC)   Fracture of maxillary sinus (HCC)   Atrial fibrillation, chronic (HCC)  Syncope Continue telemetry and watch for arrhythmias Troponins 9 > 13  EKG personally reviewed  showed normal sinus rhythm at a rate of 62 bpm Echocardiogram done in May 2019 showed LVEF of 55 to 60%.  Mild hypokinesis of the inferior and inferoseptal myocardium.  G1 DD Echocardiogram will be done to rule out significant aortic stenosis or other outflow obstruction, and also to evaluate EF and to rule out segmental/Regional wall motion abnormalities.  Carotid artery Dopplers will be done to rule out hemodynamically significant stenosis  Fall at home Continue fall precaution and neurochecks Continue PT/OT eval and treat  Closed fracture of nasal bone Closed fracture of right orbital floor Closed fracture of maxillary sinus ENT  was consulted and recommended outpatient follow-up per EDP  Chronic atrial fibrillation Continue diltiazem, metoprolol, Eliquis  Acquired hypothyroidism Continue Synthroid  Essential hypertension Continue Lopressor, Cardizem  CAD Continue Crestor, Lopressor, Eliquis  Mixed hyperlipidemia Continue Crestor    DVT prophylaxis: Eliquis   Advance Care Planning: CODE STATUS: Full code  Consults: None  Family Communication: None at bedside  Severity of Illness: The appropriate patient status for this patient is OBSERVATION. Observation status is judged to be reasonable and necessary in order to provide the required intensity of service to ensure the patient's safety. The patient's presenting symptoms, physical exam findings, and initial radiographic and laboratory data in the context of their medical condition is felt to place them at decreased risk for further clinical deterioration. Furthermore, it is anticipated that the patient will be medically stable for discharge from the hospital within 2 midnights of admission.   Author: Frankey Shown, DO 02/18/2023 2:40 AM  For on call review [email or web address].

## 2023-02-17 NOTE — ED Triage Notes (Signed)
Pt came in by ems. Pt told ems she has been running errands all day and on the way home she started to have a headache; pt states as she was getting groceries out of the care and next thing she remembers is waking up on the ground; pt has deformity to right pinky and bruising to right cheek

## 2023-02-18 ENCOUNTER — Other Ambulatory Visit: Payer: Medicare HMO

## 2023-02-18 ENCOUNTER — Observation Stay (HOSPITAL_COMMUNITY): Payer: Medicare HMO

## 2023-02-18 ENCOUNTER — Other Ambulatory Visit (HOSPITAL_COMMUNITY): Payer: Medicare HMO

## 2023-02-18 ENCOUNTER — Other Ambulatory Visit (HOSPITAL_COMMUNITY): Payer: Self-pay | Admitting: *Deleted

## 2023-02-18 ENCOUNTER — Ambulatory Visit: Payer: Medicare HMO | Admitting: Nurse Practitioner

## 2023-02-18 DIAGNOSIS — S022XXA Fracture of nasal bones, initial encounter for closed fracture: Secondary | ICD-10-CM | POA: Diagnosis present

## 2023-02-18 DIAGNOSIS — W19XXXA Unspecified fall, initial encounter: Secondary | ICD-10-CM | POA: Diagnosis not present

## 2023-02-18 DIAGNOSIS — S02401A Maxillary fracture, unspecified, initial encounter for closed fracture: Secondary | ICD-10-CM | POA: Diagnosis present

## 2023-02-18 DIAGNOSIS — I482 Chronic atrial fibrillation, unspecified: Secondary | ICD-10-CM | POA: Diagnosis present

## 2023-02-18 DIAGNOSIS — Y92009 Unspecified place in unspecified non-institutional (private) residence as the place of occurrence of the external cause: Secondary | ICD-10-CM | POA: Diagnosis not present

## 2023-02-18 DIAGNOSIS — R55 Syncope and collapse: Secondary | ICD-10-CM | POA: Diagnosis not present

## 2023-02-18 DIAGNOSIS — S0231XA Fracture of orbital floor, right side, initial encounter for closed fracture: Secondary | ICD-10-CM | POA: Diagnosis not present

## 2023-02-18 LAB — ECHOCARDIOGRAM COMPLETE
Height: 62 in
Weight: 1830.7 [oz_av]

## 2023-02-18 MED ORDER — ISOSORBIDE MONONITRATE ER 30 MG PO TB24
30.0000 mg | ORAL_TABLET | Freq: Every day | ORAL | 2 refills | Status: DC
Start: 2023-02-18 — End: 2023-02-22

## 2023-02-18 MED ORDER — APIXABAN 2.5 MG PO TABS
2.5000 mg | ORAL_TABLET | Freq: Two times a day (BID) | ORAL | Status: DC
  Administered 2023-02-18 (×2): 2.5 mg via ORAL
  Filled 2023-02-18 (×2): qty 1

## 2023-02-18 MED ORDER — ACETAMINOPHEN 325 MG PO TABS
650.0000 mg | ORAL_TABLET | Freq: Four times a day (QID) | ORAL | Status: DC | PRN
  Administered 2023-02-18: 650 mg via ORAL
  Filled 2023-02-18: qty 2

## 2023-02-18 MED ORDER — INFLUENZA VAC A&B SURF ANT ADJ 0.5 ML IM SUSY: 0.5000 mL | PREFILLED_SYRINGE | INTRAMUSCULAR | Status: DC

## 2023-02-18 MED ORDER — LEVOTHYROXINE SODIUM 75 MCG PO TABS
75.0000 ug | ORAL_TABLET | Freq: Every day | ORAL | Status: DC
  Administered 2023-02-18: 75 ug via ORAL
  Filled 2023-02-18: qty 1

## 2023-02-18 MED ORDER — DILTIAZEM HCL 30 MG PO TABS: 30.0000 mg | ORAL_TABLET | Freq: Four times a day (QID) | ORAL | 1 refills | Status: DC | PRN

## 2023-02-18 MED ORDER — METOPROLOL TARTRATE 25 MG PO TABS
25.0000 mg | ORAL_TABLET | Freq: Two times a day (BID) | ORAL | Status: DC
  Administered 2023-02-18 (×2): 25 mg via ORAL
  Filled 2023-02-18 (×2): qty 1

## 2023-02-18 MED ORDER — DILTIAZEM HCL 30 MG PO TABS: 30.0000 mg | ORAL_TABLET | Freq: Four times a day (QID) | ORAL | Status: DC | PRN

## 2023-02-18 MED ORDER — ROSUVASTATIN CALCIUM 20 MG PO TABS
20.0000 mg | ORAL_TABLET | Freq: Every day | ORAL | Status: DC
  Administered 2023-02-18: 20 mg via ORAL
  Filled 2023-02-18: qty 1

## 2023-02-18 NOTE — Care Management Obs Status (Signed)
MEDICARE OBSERVATION STATUS NOTIFICATION   Patient Details  Name: Evelyn [name] MRN: [id number] Date of Birth: 1941-01-30   Medicare Observation Status Notification Given:  Yes    Corey Harold 02/18/2023, 4:05 PM

## 2023-02-18 NOTE — TOC Initial Note (Signed)
Transition of Care [facility]) - Initial/Assessment Note    Patient Details  Name: Evelyn [name] MRN: [id number] Date of Birth: [birth year]  Transition of Care [facility]) CM/SW Contact:    Erin Sons, LCSW Phone Number: 02/18/2023, 2:31 PM  Clinical Narrative:                  CSW met with pt to discuss PT rec. Pt lives at home with her son. Son works but Network engineer has multiple neighbors and also has a daughter near by. She states she does not need any DME. She is agreeable to [facility] referral and has no preference on agency. CSW sent [facility] referral to [facility].   Expected Discharge Plan: Home w Home Health Services Barriers to Discharge: Continued Medical Work up   Patient Goals and CMS Choice     Choice offered to / list presented to : Patient      Expected Discharge Plan and Services       Living arrangements for the past 2 months: Single Family Home                             HH Agency:  Cindie Laroche) Date HH Agency Contacted: 02/18/23 Time HH Agency Contacted: 1431 Representative spoke with at [facility] Agency: Kathi Simpers  Prior Living Arrangements/Services Living arrangements for the past 2 months: Single Family Home Lives with:: Adult Children Patient language and need for interpreter reviewed:: Yes Do you feel safe going back to the place where you live?: Yes      Need for Family Participation in Patient Care: No (Comment) Care giver support system in place?: Yes (comment)   Criminal Activity/Legal Involvement Pertinent to Current Situation/Hospitalization: No - Comment as needed  Activities of Daily Living   ADL Screening (condition at time of admission) Independently performs ADLs?: Yes (appropriate for developmental age) Is the patient deaf or have difficulty hearing?: Yes Does the patient have difficulty seeing, even when wearing glasses/contacts?: No Does the patient have difficulty concentrating, remembering, or making decisions?: No  Permission Sought/Granted                   Emotional Assessment Appearance:: Appears stated age Attitude/Demeanor/Rapport: Engaged Affect (typically observed): Accepting Orientation: : Oriented to Self, Oriented to Place, Oriented to  Time, Oriented to Situation Alcohol / Substance Use: Not Applicable Psych Involvement: No (comment)  Admission diagnosis:  Syncope [R55] Laceration of right little finger without foreign body without damage to nail, initial encounter [S61.216A] Closed fracture of nasal bone, initial encounter [S02.2XXA] Fall, initial encounter [id number]] Closed fracture of maxillary sinus, initial encounter (HCC) [S02.401A] Syncope, unspecified syncope type [R55] Closed fracture of right orbital floor, initial encounter (HCC) [S02.31XA] On apixaban therapy [Z79.01] Patient Active Problem List   Diagnosis Date Noted   Fall at home, initial encounter 02/18/2023   Fracture of nasal bone 02/18/2023   Fracture of right orbital floor (HCC) 02/18/2023   Fracture of maxillary sinus (HCC) 02/18/2023   Atrial fibrillation, chronic (HCC) 02/18/2023   Syncope 02/17/2023   Hypotension 11/25/2022   Intractable back pain 11/20/2021   Acquired thrombophilia (HCC) 11/20/2021   Hypokalemia 11/20/2021   Compression fracture of L1 vertebra (HCC) 11/20/2021   Paroxysmal atrial fibrillation (HCC) 12/18/2019   Cervical radiculopathy at C5 08/07/2019   Long term (current) use of anticoagulants 11/25/2018   DDD (degenerative disc disease), cervical 10/17/2018   Mixed hyperlipidemia 10/17/2018   Hemispheric carotid artery syndrome  06/14/2018   Pain management 08/01/2017   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 CABG X 3 2004 12/15/2011   Hypothyroidism 12/15/2011   PCP:  Bennie Pierini, FNP Pharmacy:   [facility] [phone] - [place] - [address] [place] [address] Phone: [phone] Fax: [phone]  [facility] [place], [place] - [address] [place] [address] Phone: [phone] Fax: [phone]  [facility] [address] [place] [address] Phone: [phone] Fax: [phone]     Social Determinants of Health (SDOH) Social History: SDOH Screenings   Food Insecurity: No Food Insecurity (02/18/2023)  Housing: Low Risk  (02/18/2023)  Transportation Needs: No Transportation Needs (02/18/2023)  Utilities: Not At Risk (02/18/2023)  Alcohol Screen: Low Risk  (06/21/2022)  Depression (PHQ2-9): Low Risk  (11/26/2022)  Financial Resource Strain: Low Risk  (06/21/2022)  Physical Activity: Insufficiently Active (06/21/2022)  Social Connections: Moderately Integrated (06/21/2022)  Stress: No Stress Concern Present (06/21/2022)  Tobacco Use: Low Risk  (01/10/2023)   SDOH Interventions:     Readmission Risk Interventions     No data to display

## 2023-02-18 NOTE — Progress Notes (Signed)
*  PRELIMINARY RESULTS* Echocardiogram 2D Echocardiogram has been performed.  Evelyn [name] 02/18/2023, 5:06 PM

## 2023-02-18 NOTE — Evaluation (Signed)
Physical Therapy Evaluation Patient Details Name: Evelyn [name] MRN: [id number] DOB: 1940/10/31 Today's Date: 02/18/2023  History of Present Illness  Evelyn [name] is an 82 y.o. female with medical history significant of hypertension, hyperlipidemia chronic atrial fibrillation on Eliquis, CAD status post stent placement, hypothyroidism, chronic pain due to chronic cervical disc disease and prior back and neck surgeries who presents to the emergency department from home via EMS due to syncopal-like event at home.  Patient states that while returning home from running errands, she felt some tightness across her chest and thought it was due to her A-fib.  On getting home, while taking groceries out of the car, she suspected that she probably passed out, because she suddenly woke up on the floor of the driveway.  She was unable to get up, one of her neighbors who saw her came to help and activated EMS.  Morphine was given en route and her pain has improved by the time she gets to the ED.   Clinical Impression  Patient functioning near baseline for functional mobility and gait other than having to frequently lean on side rails in hallway during ambulation .  Patient required use of RW for longer distance with good return for use and able able to go up/down stairs in stairwell without loss of balance.  Patient encouraged to ambulate ad lib in room and with nursing staff in hallways.  Plan:  Patient discharged from physical therapy to care of nursing for ambulation daily as tolerated for length of stay.          If plan is discharge home, recommend the following: Help with stairs or ramp for entrance;Assistance with cooking/housework   Can travel by private vehicle        Equipment Recommendations None recommended by PT  Recommendations for Other Services       Functional Status Assessment Patient has had a recent decline in their functional status and demonstrates the ability to make  significant improvements in function in a reasonable and predictable amount of time.     Precautions / Restrictions Precautions Precautions: Fall Restrictions Weight Bearing Restrictions: No      Mobility  Bed Mobility Overal bed mobility: Modified Independent                  Transfers Overall transfer level: Needs assistance Equipment used: Rolling walker (2 wheels), None Transfers: Sit to/from Stand, Bed to chair/wheelchair/BSC Sit to Stand: Supervision   Step pivot transfers: Supervision       General transfer comment: slighly labored movement having to lean on armrest of chair without AD, safer using RW    Ambulation/Gait Ambulation/Gait assistance: Supervision Gait Distance (Feet): 100 Feet Assistive device: Rolling walker (2 wheels), None Gait Pattern/deviations: Decreased step length - right, Decreased step length - left, Decreased stride length Gait velocity: decreased     General Gait Details: slightly labored cadence with frequent leaning on side rails in hallway for support, safer using RW with good return for use with no loss of balance  Stairs Stairs: Yes Stairs assistance: Supervision Stair Management: One rail Right, One rail Left, Step to pattern Number of Stairs: 8 General stair comments: good return for going up/down steps in stair using 1 side rail without loss of balance and understanding acknowledged  Wheelchair Mobility     Tilt Bed    Modified Rankin (Stroke Patients Only)       Balance Overall balance assessment: Needs assistance Sitting-balance support: Feet supported, No upper  extremity supported Sitting balance-Leahy Scale: Good Sitting balance - Comments: seated at EOB   Standing balance support: During functional activity, No upper extremity supported Standing balance-Leahy Scale: Fair Standing balance comment: fair/good using RW                             Pertinent Vitals/Pain Pain Assessment Pain  Assessment: Faces Faces Pain Scale: Hurts a little bit Pain Location: R hand L foot Pain Descriptors / Indicators: Discomfort Pain Intervention(s): Limited activity within patient's tolerance, Monitored during session, Repositioned    Home Living Family/patient expects to be discharged to:: Private residence Living Arrangements: Children Available Help at Discharge: Family;Available PRN/intermittently Type of Home: House Home Access: Stairs to enter Entrance Stairs-Rails: None Entrance Stairs-Number of Steps: 2 and 4   Home Layout: One level Home Equipment: Agricultural consultant (2 wheels)      Prior Function Prior Level of Function : Independent/Modified Independent             Mobility Comments: Tourist information centre manager without AD; drives ADLs Comments: Independent     Extremity/Trunk Assessment   Upper Extremity Assessment Upper Extremity Assessment: Defer to OT evaluation    Lower Extremity Assessment Lower Extremity Assessment: Overall WFL for tasks assessed    Cervical / Trunk Assessment Cervical / Trunk Assessment: Normal  Communication   Communication Communication: No apparent difficulties  Cognition Arousal: Alert Behavior During Therapy: WFL for tasks assessed/performed Overall Cognitive Status: Within Functional Limits for tasks assessed                                          General Comments      Exercises     Assessment/Plan    PT Assessment All further PT needs can be met in the next venue of care  PT Problem List Decreased strength;Decreased activity tolerance;Decreased balance;Decreased mobility       PT Treatment Interventions      PT Goals (Current goals can be found in the Care Plan section)  Acute Rehab PT Goals Patient Stated Goal: return home with family/neighbors to assist PT Goal Formulation: With patient Time For Goal Achievement: 02/18/23 Potential to Achieve Goals: Good    Frequency       Co-evaluation  PT/OT/SLP Co-Evaluation/Treatment: Yes Reason for Co-Treatment: To address functional/ADL transfers PT goals addressed during session: Mobility/safety with mobility;Balance;Proper use of DME         AM-PAC PT "6 Clicks" Mobility  Outcome Measure Help needed turning from your back to your side while in a flat bed without using bedrails?: None Help needed moving from lying on your back to sitting on the side of a flat bed without using bedrails?: None Help needed moving to and from a bed to a chair (including a wheelchair)?: None Help needed standing up from a chair using your arms (e.g., wheelchair or bedside chair)?: None Help needed to walk in hospital room?: A Little Help needed climbing 3-5 steps with a railing? : A Little 6 Click Score: 22    End of Session   Activity Tolerance: Patient tolerated treatment well;Patient limited by fatigue Patient left: in chair;with call bell/phone within reach Nurse Communication: Mobility status PT Visit Diagnosis: Unsteadiness on feet (R26.81);Other abnormalities of gait and mobility (R26.89);Muscle weakness (generalized) (M62.81)    Time: 5784-6962 PT Time Calculation (min) (ACUTE ONLY): 16 min  Charges:   PT Evaluation $PT Eval Low Complexity: 1 Low PT Treatments $Gait Training: 8-22 mins PT General Charges $$ ACUTE PT VISIT: 1 Visit         2:05 PM, 02/18/23 Ocie Bob, MPT Physical Therapist with [facility] [phone] office [phone] mobile phone

## 2023-02-18 NOTE — Discharge Instructions (Signed)
1)follow up with your cardiologist Dr. Royann Shivers and your EP cardiologist Dr. Elberta Fortis as soon as possible to change the battery or replace your loop recorder device 2) follow-up with ear nose and throat doctor for your nasal and facial bone injuries -please call [facility]----at [address], [place],  [place]  [address] Phone # [phone]

## 2023-02-18 NOTE — Evaluation (Signed)
Occupational Therapy Evaluation Patient Details Name: Evelyn [name] MRN: [id number] DOB: March 05, 1941 Today's Date: 02/18/2023   History of Present Illness Evelyn [name] is an 82 y.o. female with medical history significant of hypertension, hyperlipidemia chronic atrial fibrillation on Eliquis, CAD status post stent placement, hypothyroidism, chronic pain due to chronic cervical disc disease and prior back and neck surgeries who presents to the emergency department from home via EMS due to syncopal-like event at home.  Patient states that while returning home from running errands, she felt some tightness across her chest and thought it was due to her A-fib.  On getting home, while taking groceries out of the car, she suspected that she probably passed out, because she suddenly woke up on the floor of the driveway.  She was unable to get up, one of her neighbors who saw her came to help and activated EMS.  Morphine was given en route and her pain has improved by the time she gets to the ED.   Clinical Impression   Pt agreeable to OT and PT co-evaluation. Pt reports independence at baseline. Today pt required CGA while ambulating without RW due to a couple instances of loss of balance. Balance improved with use of RW. Pt also demonstrates decreased strength and coordination in R hand. Pt is operating at level of mod I for seated ADL tasks and does much better with transfers using the RW. Further OT services can be taken care of at next venue of care. Pt is not recommended for further acute OT services and will be discharged to care of nursing staff for remaining length of stay.        If plan is discharge home, recommend the following: A little help with walking and/or transfers;Help with stairs or ramp for entrance    Functional Status Assessment  Patient has had a recent decline in their functional status and demonstrates the ability to make significant improvements in function in a reasonable  and predictable amount of time.  Equipment Recommendations  None recommended by OT           Precautions / Restrictions Precautions Precautions: Fall Restrictions Weight Bearing Restrictions: No      Mobility Bed Mobility Overal bed mobility: Modified Independent                  Transfers Overall transfer level: Needs assistance Equipment used: Rolling walker (2 wheels) Transfers: Sit to/from Stand, Bed to chair/wheelchair/BSC Sit to Stand: Supervision     Step pivot transfers: Supervision     General transfer comment: Unsteady when ambulating without RW, but no physical assist for simple step pivot to chair without AD.      Balance Overall balance assessment: Needs assistance Sitting-balance support: No upper extremity supported, Feet supported Sitting balance-Leahy Scale: Good Sitting balance - Comments: seated at EOB   Standing balance support: No upper extremity supported, During functional activity Standing balance-Leahy Scale: Fair Standing balance comment: poor to fair without AD; 2 instances of loss of balance while ambulating without RW                           ADL either performed or assessed with clinical judgement   ADL Overall ADL's : Needs assistance/impaired     Grooming: Supervision/safety;Modified independent;Standing               Lower Body Dressing: Modified independent;Sitting/lateral leans   Toilet Transfer: Supervision/safety;Modified Independent;Rolling walker (2 wheels)  Functional mobility during ADLs: Contact guard assist General ADL Comments: Noted lose balance a couple times without RW with need for CGA assist while ambulating in the hall.     Vision Baseline Vision/History: 1 Wears glasses Ability to See in Adequate Light: 1 Impaired Patient Visual Report: No change from baseline Vision Assessment?: No apparent visual deficits;Wears glasses for reading     Perception Perception: Not  tested       Praxis Praxis: Not tested       Pertinent Vitals/Pain Pain Assessment Pain Assessment: Faces Faces Pain Scale: Hurts a little bit Pain Location: R hand L foot Pain Descriptors / Indicators: Discomfort Pain Intervention(s): Limited activity within patient's tolerance, Monitored during session, Repositioned     Extremity/Trunk Assessment Upper Extremity Assessment Upper Extremity Assessment: Right hand dominant;RUE deficits/detail RUE Deficits / Details: Pt noted to have stiches near R pinky. Noted 3-/5 grip strength and difficulty with opposition. R hand bruised and swollen. RUE Coordination: decreased fine motor   Lower Extremity Assessment Lower Extremity Assessment: Defer to PT evaluation   Cervical / Trunk Assessment Cervical / Trunk Assessment: Normal   Communication Communication Communication: No apparent difficulties   Cognition Arousal: Alert Behavior During Therapy: WFL for tasks assessed/performed Overall Cognitive Status: Within Functional Limits for tasks assessed                                                        Home Living Family/patient expects to be discharged to:: Private residence Living Arrangements: Children Available Help at Discharge: Family;Available PRN/intermittently Type of Home: House Home Access: Stairs to enter [employer] of Steps: 2 and 4 Entrance Stairs-Rails: None Home Layout: One level     Bathroom Shower/Tub: Chief Strategy Officer: Handicapped height Bathroom Accessibility: Yes How Accessible: Accessible via wheelchair;Accessible via walker Home Equipment: Rolling Walker (2 wheels)          Prior Functioning/Environment Prior Level of Function : Independent/Modified Independent             Mobility Comments: Tourist information centre manager without AD; drives ADLs Comments: Independent                                Co-evaluation PT/OT/SLP  Co-Evaluation/Treatment: Yes Reason for Co-Treatment: To address functional/ADL transfers   OT goals addressed during session: ADL's and self-care      AM-PAC OT "6 Clicks" Daily Activity     Outcome Measure Help from another person eating meals?: None Help from another person taking care of personal grooming?: None Help from another person toileting, which includes using toliet, bedpan, or urinal?: None Help from another person bathing (including washing, rinsing, drying)?: None Help from another person to put on and taking off regular upper body clothing?: None Help from another person to put on and taking off regular lower body clothing?: None 6 Click Score: 24   End of Session Equipment Utilized During Treatment: Rolling walker (2 wheels)  Activity Tolerance: Patient tolerated treatment well Patient left: with call bell/phone within reach;in bed  OT Visit Diagnosis: Unsteadiness on feet (R26.81);Other abnormalities of gait and mobility (R26.89);Muscle weakness (generalized) (M62.81)                Time: 1610-9604 OT Time Calculation (min): 16 min  Charges:  OT General Charges $OT Visit: 1 Visit OT Evaluation $OT Eval Low Complexity: 1 Low  Vona Whiters OT, MOT  Danie Chandler 02/18/2023, 10:02 AM

## 2023-02-18 NOTE — ED Notes (Signed)
ED TO INPATIENT HANDOFF REPORT  ED Nurse Name and Phone #: Manus Gunning, RN   S Name/Age/Gender Evelyn [name] 82 y.o. female Room/Bed: APA03/APA03  Code Status   Code Status: Prior  Home/SNF/Other Home Patient oriented to: self, place, time, and situation Is this baseline? Yes   Triage Complete: Triage complete  Chief Complaint Syncope [R55]  Triage Note Pt came in by ems. Pt told ems she has been running errands all day and on the way home she started to have a headache; pt states as she was getting groceries out of the care and next thing she remembers is waking up on the ground; pt has deformity to right pinky and bruising to right cheek   Allergies Allergies  Allergen Reactions   Sulfa Antibiotics Swelling    Level of Care/Admitting Diagnosis ED Disposition     ED Disposition  Admit   Condition  --   Comment  Hospital Area: [facility] [[id number]]  Level of Care: Med-Surg [16]  Covid Evaluation: Asymptomatic - no recent exposure (last 10 days) testing not required  Diagnosis: Syncope [206001]  Admitting Physician: Frankey Shown [[id number]]  Attending Physician: Frankey Shown [[id number]]          B Medical/Surgery History Past Medical History:  Diagnosis Date   Aneurysm (HCC)    Aortic insufficiency    Atrial fibrillation (HCC)    CAD (coronary artery disease)    CABG LIMA to LAD,vein graft to diagonal,vein graft to OM   Dyslipidemia    Essential hypertension 09/16/2013   GERD (gastroesophageal reflux disease)    Hypothyroid    Past Surgical History:  Procedure Laterality Date   ABDOMINAL HYSTERECTOMY     BACK SURGERY  2009   BACK SURGERY     CORONARY ANGIOPLASTY  2003   cutting balloon atherectomy in-stent stenosis of the LAD.   CORONARY ANGIOPLASTY WITH STENT PLACEMENT  2002   remote PCI & stenting LAD,   CORONARY ARTERY BYPASS GRAFT  2004   LIMA to LAD,vein graft to diagonal,vein graft to OM   FOOT SURGERY     KYPHOPLASTY N/A  11/24/2021   Procedure: LUMBAR ONE KYPHOPLASTY;  Surgeon: Barnett Abu, MD;  Location: [facility] OR;  Service: Neurosurgery;  Laterality: N/A;  LUMBAR ONE KYPHOPLASTY   LEFT HEART CATH AND CORS/GRAFTS ANGIOGRAPHY N/A 02/23/2017   Procedure: LEFT HEART CATH AND CORS/GRAFTS ANGIOGRAPHY;  Surgeon: Iran Ouch, MD;  Location: [facility] INVASIVE CV LAB;  Service: Cardiovascular;  Laterality: N/A;   LEFT HEART CATHETERIZATION WITH CORONARY ANGIOGRAM N/A 12/15/2011   Procedure: LEFT HEART CATHETERIZATION WITH CORONARY ANGIOGRAM;  Surgeon: Thurmon Fair, MD;  Location: [facility] CATH LAB;  Service: Cardiovascular;  Laterality: N/A;   LOOP RECORDER INSERTION N/A 06/14/2018   Procedure: LOOP RECORDER INSERTION;  Surgeon: Thurmon Fair, MD;  Location: [facility] INVASIVE CV LAB;  Service: Cardiovascular;  Laterality: N/A;   NECK SURGERY     NM MYOCAR PERF WALL MOTION  12/01/2011   normal study-persistent exercise induced ECG changes raises the concern for ischemia (balanced ischemia).   NOSE SURGERY     PACEMAKER INSERTION     NOT PACEMAKER - LOOP RECORDER   TONSILLECTOMY  1948   TUBAL LIGATION       A IV Location/Drains/Wounds Patient Lines/Drains/Airways Status     Active Line/Drains/Airways     Name Placement date Placement time Site Days   Peripheral IV 02/17/23 18 G Left Antecubital 02/17/23  1719  Antecubital  1  Intake/Output Last 24 hours No intake or output data in the 24 hours ending 02/18/23 0359  Labs/Imaging Results for orders placed or performed during the hospital encounter of 02/17/23 (from the past 48 hour(s))  Comprehensive metabolic panel     Status: Abnormal   Collection Time: 02/17/23  6:40 PM  Result Value Ref Range   Sodium 138 135 - 145 mmol/L   Potassium 3.5 3.5 - 5.1 mmol/L   Chloride 106 98 - 111 mmol/L   CO2 20 (L) 22 - 32 mmol/L   Glucose, Bld 111 (H) 70 - 99 mg/dL    Comment: Glucose reference range applies only to samples taken after fasting for at least 8  hours.   BUN 12 8 - 23 mg/dL   Creatinine, Ser 1.61 0.44 - 1.00 mg/dL   Calcium 8.6 (L) 8.9 - 10.3 mg/dL   Total Protein 6.7 6.5 - 8.1 g/dL   Albumin 3.6 3.5 - 5.0 g/dL   AST 21 15 - 41 U/L   ALT 16 0 - 44 U/L   Alkaline Phosphatase 45 38 - 126 U/L   Total Bilirubin 0.4 0.3 - 1.2 mg/dL   GFR, Estimated >09 >60 mL/min    Comment: (NOTE) Calculated using the CKD-EPI Creatinine Equation (2021)    Anion gap 12 5 - 15    Comment: Performed at [facility], [address], [place], [place] [address]  CBC     Status: None   Collection Time: 02/17/23  6:40 PM  Result Value Ref Range   WBC 7.9 4.0 - 10.5 K/uL   RBC 4.04 3.87 - 5.11 MIL/uL   Hemoglobin 12.5 12.0 - 15.0 g/dL   HCT 81.1 91.4 - 78.2 %   MCV 93.3 80.0 - 100.0 fL   MCH 30.9 26.0 - 34.0 pg   MCHC 33.2 30.0 - 36.0 g/dL   RDW 95.6 21.3 - 08.6 %   Platelets 199 150 - 400 K/uL   nRBC 0.0 0.0 - 0.2 %    Comment: Performed at [facility], [address] [place], [place] [address]  Troponin I (High Sensitivity)     Status: None   Collection Time: 02/17/23  6:40 PM  Result Value Ref Range   Troponin I (High Sensitivity) 9 <18 ng/L    Comment: (NOTE) Elevated high sensitivity troponin I (hsTnI) values and significant  changes across serial measurements may suggest ACS but many other  chronic and acute conditions are known to elevate hsTnI results.  Refer to the "Links" section for chest pain algorithms and additional  guidance. Performed at [facility], [address] [place], [place] [address]   Urinalysis, Routine w reflex microscopic -Urine, Clean Catch     Status: Abnormal   Collection Time: 02/17/23  9:27 PM  Result Value Ref Range   Color, Urine YELLOW YELLOW   APPearance CLEAR CLEAR   Specific Gravity, Urine 1.010 1.005 - 1.030   pH 6.0 5.0 - 8.0   Glucose, UA NEGATIVE NEGATIVE mg/dL   Hgb urine dipstick MODERATE (A) NEGATIVE   Bilirubin Urine NEGATIVE NEGATIVE   Ketones, ur 5 (A) NEGATIVE mg/dL   Protein, ur  NEGATIVE NEGATIVE mg/dL   Nitrite NEGATIVE NEGATIVE   Leukocytes,Ua NEGATIVE NEGATIVE   RBC / HPF 0-5 0 - 5 RBC/hpf   WBC, UA 0-5 0 - 5 WBC/hpf   Bacteria, UA NONE SEEN NONE SEEN   Squamous Epithelial / HPF 0-5 0 - 5 /HPF   Mucus PRESENT  Comment: Performed at [facility], [address], [place], [place] [address]  Troponin I (High Sensitivity)     Status: None   Collection Time: 02/17/23 10:43 PM  Result Value Ref Range   Troponin I (High Sensitivity) 13 <18 ng/L    Comment: (NOTE) Elevated high sensitivity troponin I (hsTnI) values and significant  changes across serial measurements may suggest ACS but many other  chronic and acute conditions are known to elevate hsTnI results.  Refer to the "Links" section for chest pain algorithms and additional  guidance. Performed at [facility], [address] [place], [place] [address]    DG Foot Complete Left  Result Date: 02/17/2023 CLINICAL DATA:  Pain. EXAM: LEFT FOOT - COMPLETE 3+ VIEW COMPARISON:  None Available. FINDINGS: Postop changes consistent with bunion repair including a healed first metatarsal osteotomy and K-wire fixation. No evidence of acute fracture or dislocation in the left foot. No focal bone lesion or bone destruction. Mild medial subluxation at the second metatarsal-phalangeal joint is nonspecific but likely chronic. Soft tissues are unremarkable. IMPRESSION: No acute bony abnormalities. Old left first metatarsal bunion repair. Subluxation at the second metatarsal-phalangeal joint is likely chronic. Electronically Signed   By: Burman Nieves M.D.   On: 02/17/2023 22:34   DG Ankle Complete Left  Result Date: 02/17/2023 CLINICAL DATA:  Pain. EXAM: LEFT ANKLE COMPLETE - 3+ VIEW COMPARISON:  None Available. FINDINGS: There is no evidence of fracture, dislocation, or joint effusion. There is no evidence of arthropathy or other focal bone abnormality. Soft tissues are unremarkable. IMPRESSION: Negative. Electronically  Signed   By: Burman Nieves M.D.   On: 02/17/2023 22:33   CT Maxillofacial Wo Contrast  Result Date: 02/17/2023 CLINICAL DATA:  Blunt facial trauma.  Syncope with fall.  Headache. EXAM: CT MAXILLOFACIAL WITHOUT CONTRAST TECHNIQUE: Multidetector CT imaging of the maxillofacial structures was performed. Multiplanar CT image reconstructions were also generated. RADIATION DOSE REDUCTION: This exam was performed according to the departmental dose-optimization program which includes automated exposure control, adjustment of the mA and/or kV according to patient size and/or use of iterative reconstruction technique. COMPARISON:  CT head 08/22/2022 FINDINGS: Osseous: Comminuted and mildly depressed fractures of the anterior nasal bones. Nasal septum appears intact. Fractures of the anterior, lateral, and medial wall of the right maxillary antrum with mild depression of the fracture fragments. Nondepressed fractures of the right inferior orbital wall. Degenerative changes in the temporomandibular joints. Orbits: The globes and extraocular muscles appear intact and symmetrical. Small amount of extraconal gas along the inferior and medial aspect of the right orbit. No retrobulbar infiltration. Sinuses: Opacification and air-fluid levels in the right maxillary antrum and right sphenoid sinus. Mastoid air cells are clear. Soft tissues: Mild soft tissue swelling/hematoma and soft tissue gas inferior to the right orbit. Limited intracranial: See additional report of CT head. IMPRESSION: 1. Comminuted and mildly depressed anterior nasal bone fractures. 2. Fractures of the anterior, lateral, and medial right maxillary antral wall with mild depression of fracture fragments. Associated air-fluid levels in the right paranasal sinuses. 3. Nondepressed fractures of the right inferior orbital wall. Associated extraconal gas in the right orbit. Small soft tissue hematoma and gas in the subcutaneous fat inferior to the right orbit.  Electronically Signed   By: Burman Nieves M.D.   On: 02/17/2023 19:50   CT CERVICAL SPINE WO CONTRAST  Result Date: 02/17/2023 CLINICAL DATA:  Poly trauma, blunt. Syncopal episode with fall. Headache. EXAM: CT CERVICAL SPINE WITHOUT CONTRAST TECHNIQUE: Multidetector CT  imaging of the cervical spine was performed without intravenous contrast. Multiplanar CT image reconstructions were also generated. RADIATION DOSE REDUCTION: This exam was performed according to the departmental dose-optimization program which includes automated exposure control, adjustment of the mA and/or kV according to patient size and/or use of iterative reconstruction technique. COMPARISON:  Cervical spine radiographs 06/19/2019. CT cervical spine 05/14/2018 FINDINGS: Alignment: Normal alignment. Skull base and vertebrae: Skull base appears intact. No vertebral compression deformities. No focal bone lesion or bone destruction. Soft tissues and spinal canal: No prevertebral soft tissue swelling. No abnormal paraspinal soft tissue mass or infiltration. Disc levels: Postoperative changes with anterior plate and screw fixation and intervertebral fusion from C4 through C6. Degenerative changes in the remaining interspace levels with prominent osteophyte formation. Prominent degenerative changes in the facet joints. Uncovertebral and facet joint osteophytes cause bone encroachment upon the neural foramina bilaterally. Upper chest: Left apical calcification. Lung apices are clear. Partially calcified left thyroid gland nodules, largest measuring 1.7 cm diameter. No change since prior study from 10/06/2016. Other: None. IMPRESSION: 1. Normal alignment of the cervical spine. No acute displaced fractures identified. 2. Anterior fixation and intervertebral fusion from C4 through C6. Degenerative changes within the remainder of the cervical spine and in the facet joints. 3. Calcified left thyroid gland nodule. No change since previous study.  Stability for greater than 5 years implies benignity; no biopsy or followup indicated (ref: J Am Coll Radiol. 2015 Feb;12(2): 143-50). Electronically Signed   By: Burman Nieves M.D.   On: 02/17/2023 19:46   CT HEAD WO CONTRAST  Result Date: 02/17/2023 CLINICAL DATA:  Head trauma, moderate to severe. Syncope with fall. Headache. EXAM: CT HEAD WITHOUT CONTRAST TECHNIQUE: Contiguous axial images were obtained from the base of the skull through the vertex without intravenous contrast. RADIATION DOSE REDUCTION: This exam was performed according to the departmental dose-optimization program which includes automated exposure control, adjustment of the mA and/or kV according to patient size and/or use of iterative reconstruction technique. COMPARISON:  MRI brain 09/23/2022.  CT head 08/22/2022 FINDINGS: Brain: No evidence of acute infarction, hemorrhage, hydrocephalus, extra-axial collection or mass lesion/mass effect. Mild diffuse cerebral atrophy. Mild low-attenuation changes in the deep white matter consistent with small vessel ischemia. Vascular: No hyperdense vessel or unexpected calcification. Skull: Calvarium appears intact. Sinuses/Orbits: See additional report of CT maxillofacial. Other: None. IMPRESSION: No acute intracranial abnormalities. Mild diffuse cerebral atrophy and small vessel ischemic changes. Electronically Signed   By: Burman Nieves M.D.   On: 02/17/2023 19:40   DG Pelvis Portable  Result Date: 02/17/2023 CLINICAL DATA:  Trauma.  Syncope with fall. EXAM: PORTABLE PELVIS 1-2 VIEWS COMPARISON:  None Available. FINDINGS: Degenerative changes in the lower lumbar spine. Pelvis and visualized hips appear intact. No acute displaced fractures identified. No focal bone lesion or bone destruction. SI joints and symphysis pubis are not displaced. Soft tissues are unremarkable. IMPRESSION: No acute bony abnormalities.  Mild degenerative changes. Electronically Signed   By: Burman Nieves M.D.    On: 02/17/2023 19:37   DG Chest Port 1 View  Result Date: 02/17/2023 CLINICAL DATA:  Trauma.  Syncope and fall. EXAM: PORTABLE CHEST 1 VIEW COMPARISON:  11/30/2021 FINDINGS: Postoperative changes in the mediastinum and lower cervical spine. A loop recorder is present. Mild cardiac enlargement. Lungs are clear. No pleural effusions. No pneumothorax. Mediastinal contours appear intact. Calcified and tortuous aorta. Degenerative changes in the spine and shoulders. IMPRESSION: Cardiac enlargement.  No evidence of active pulmonary disease. Electronically Signed  By: Burman Nieves M.D.   On: 02/17/2023 19:35   DG Hand Complete Right  Result Date: 02/17/2023 CLINICAL DATA:  Right hand pain after a fall. EXAM: RIGHT HAND - COMPLETE 3+ VIEW COMPARISON:  None Available. FINDINGS: Prominent diffuse degenerative changes throughout the interphalangeal joints as well as in the STT joints of the right wrist. No evidence of acute fracture or dislocation. No focal bone lesion or bone destruction. Soft tissues are unremarkable. IMPRESSION: Prominent degenerative changes in the right hand. No acute bony abnormalities. Electronically Signed   By: Burman Nieves M.D.   On: 02/17/2023 19:34    Pending Labs Unresulted Labs (From admission, onward)    None       Vitals/Pain Today's Vitals   02/17/23 2204 02/18/23 0147 02/18/23 0330 02/18/23 0355  BP:   109/82   Pulse:   99   Resp:   17   Temp: 98 F (36.7 C)   98.3 F (36.8 C)  TempSrc:    Oral  SpO2:   93%   PainSc:  Asleep      Isolation Precautions No active isolations  Medications Medications  diltiazem (CARDIZEM) tablet 30 mg (has no administration in time range)  rosuvastatin (CRESTOR) tablet 20 mg (has no administration in time range)  metoprolol tartrate (LOPRESSOR) tablet 25 mg (25 mg Oral Given 02/18/23 0356)  levothyroxine (SYNTHROID) tablet 75 mcg (has no administration in time range)  apixaban (ELIQUIS) tablet 2.5 mg (2.5 mg Oral  Given 02/18/23 0357)  lidocaine (PF) (XYLOCAINE) 1 % injection 5 mL (5 mLs Infiltration Given by Other 02/17/23 2118)  ondansetron (ZOFRAN) injection 4 mg (4 mg Intravenous Given 02/17/23 2158)    Mobility walks     Focused Assessments     R Recommendations: See Admitting Provider Note  Report given to:   Additional Notes: Alert, oriented, and ambulatory. Says she was out running errands all day and had a syncopal episode. Very pleasant individual. Lives at home with son and completes all ADL's independently.

## 2023-02-18 NOTE — Plan of Care (Signed)
  Problem: Education: Goal: Knowledge of General Education information will improve Description: Including pain rating scale, medication(s)/side effects and non-pharmacologic comfort measures Outcome: Progressing   Problem: Activity: Goal: Risk for activity intolerance will decrease Outcome: Progressing   

## 2023-02-18 NOTE — Discharge Summary (Signed)
Evelyn [name], is a 82 y.o. female  DOB Sep 16, 1940  MRN [id number].  Admission date:  02/17/2023  Admitting Physician  Frankey Shown, DO  Discharge Date:  02/18/2023   Primary MD  Bennie Pierini, FNP  Recommendations for primary care physician for things to follow:   1)follow up with your cardiologist Dr. Royann Shivers and your EP cardiologist Dr. Elberta Fortis as soon as possible to change the battery or replace your loop recorder device 2) follow-up with ear nose and throat doctor for your nasal and facial bone injuries -please call [facility] [address]----at [address], [place],  [place]  [address] Phone # [phone]  Admission Diagnosis  Syncope [R55] Laceration of right little finger without foreign body without damage to nail, initial encounter [S61.216A] Closed fracture of nasal bone, initial encounter [S02.2XXA] Fall, initial encounter [id number]] Closed fracture of maxillary sinus, initial encounter (HCC) [S02.401A] Syncope, unspecified syncope type [R55] Closed fracture of right orbital floor, initial encounter (HCC) [S02.31XA] On apixaban therapy [Z79.01]   Discharge Diagnosis  Syncope [R55] Laceration of right little finger without foreign body without damage to nail, initial encounter [S61.216A] Closed fracture of nasal bone, initial encounter [S02.2XXA] Fall, initial encounter [id number]] Closed fracture of maxillary sinus, initial encounter (HCC) [S02.401A] Syncope, unspecified syncope type [R55] Closed fracture of right orbital floor, initial encounter (HCC) [S02.31XA] On apixaban therapy [Z79.01]    Principal Problem:   Syncope Active Problems:   CAD, PCI '02, '04. CABG X 3 2004   Essential hypertension   Mixed hyperlipidemia   Fall at home, initial encounter   Fracture of nasal bone   Fracture of right orbital floor (HCC)   Fracture of maxillary  sinus (HCC)   Atrial fibrillation, chronic (HCC)      Past Medical History:  Diagnosis Date   Aneurysm (HCC)    Aortic insufficiency    Atrial fibrillation (HCC)    CAD (coronary artery disease)    CABG LIMA to LAD,vein graft to diagonal,vein graft to OM   Dyslipidemia    Essential hypertension 09/16/2013   GERD (gastroesophageal reflux disease)    Hypothyroid     Past Surgical History:  Procedure Laterality Date   ABDOMINAL HYSTERECTOMY     BACK SURGERY  2009   BACK SURGERY     CORONARY ANGIOPLASTY  2003   cutting balloon atherectomy in-stent stenosis of the LAD.   CORONARY ANGIOPLASTY WITH STENT PLACEMENT  2002   remote PCI & stenting LAD,   CORONARY ARTERY BYPASS GRAFT  2004   LIMA to LAD,vein graft to diagonal,vein graft to OM   FOOT SURGERY     KYPHOPLASTY N/A 11/24/2021   Procedure: LUMBAR ONE KYPHOPLASTY;  Surgeon: Barnett Abu, MD;  Location: [facility] OR;  Service: Neurosurgery;  Laterality: N/A;  LUMBAR ONE KYPHOPLASTY   LEFT HEART CATH AND CORS/GRAFTS ANGIOGRAPHY N/A 02/23/2017   Procedure: LEFT HEART CATH AND CORS/GRAFTS ANGIOGRAPHY;  Surgeon: Iran Ouch, MD;  Location: [facility] INVASIVE CV LAB;  Service: Cardiovascular;  Laterality: N/A;   LEFT  HEART CATHETERIZATION WITH CORONARY ANGIOGRAM N/A 12/15/2011   Procedure: LEFT HEART CATHETERIZATION WITH CORONARY ANGIOGRAM;  Surgeon: Thurmon Fair, MD;  Location: [facility] CATH LAB;  Service: Cardiovascular;  Laterality: N/A;   LOOP RECORDER INSERTION N/A 06/14/2018   Procedure: LOOP RECORDER INSERTION;  Surgeon: Thurmon Fair, MD;  Location: [facility] INVASIVE CV LAB;  Service: Cardiovascular;  Laterality: N/A;   NECK SURGERY     NM MYOCAR PERF WALL MOTION  12/01/2011   normal study-persistent exercise induced ECG changes raises the concern for ischemia (balanced ischemia).   NOSE SURGERY     PACEMAKER INSERTION     NOT PACEMAKER - LOOP RECORDER   TONSILLECTOMY  1948   TUBAL LIGATION         HPI  from the history and physical  done on the day of admission:   HPI: Evelyn [name] is an 82 y.o. female with medical history significant of hypertension, hyperlipidemia chronic atrial fibrillation on Eliquis, CAD status post stent placement, hypothyroidism, chronic pain due to chronic cervical disc disease and prior back and neck surgeries who presents to the emergency department from home via EMS due to syncopal-like event at home.  Patient states that while returning home from running errands, she felt some tightness across her chest and thought it was due to her A-fib.  On getting home, while taking groceries out of the car, she suspected that she probably passed out, because she suddenly woke up on the floor of the driveway.  She was unable to get up, one of her neighbors who saw her came to help and activated EMS.  Morphine was given en route and her pain has improved by the time she gets to the ED.   ED Course:  In the emergency department, BP was 174/76, other vital signs were within normal range.  Workup in the ED showed normal CBC, BMP was normal except for bicarb of 20 and blood glucose of 111.  Troponin x 2 was negative.  Urinalysis was normal. CT maxillofacial without contrast showed comminuted and mildly depressed anterior nasal bone fractures.  Fractures of the anterior, lateral and medial right maxillary antral wall with mild depression of fracture fragments. Associated air-fluid levels in the right paranasal sinuses. 3. Nondepressed fractures of the right inferior orbital wall. Associated extraconal gas in the right orbit. Small soft tissue hematoma and gas in the subcutaneous fat inferior to the right orbit. Other imaging studies including right hand, pelvic, left foot x-rays showed no acute fracture IV Zofran was given. ENT on-call (Dr. Jearld Fenton) was consulted and recommended no indication for any surgical intervention at this time and the patient can follow-up as an outpatient for facial fracture per ED medical  record. Hospitalist was asked to admit patient for further evaluation and management.   Review of Systems: Review of systems as noted in the HPI. All other systems reviewed and are negative.    Hospital Course:    atrial fibrillation/flutter, coronary artery disease status post CABG, TIA, hypertension, hyperlipidemia, aortic insufficiency, ascending aortic aneurysm    A/p 1)Syncope Continue telemetry and watch for arrhythmias Troponins 9 > 13  EKG  showed normal sinus rhythm at a rate of 62 bpm Echocardiogram done in May 2019 showed LVEF of 55 to 60%.  Mild hypokinesis of the inferior and inferoseptal myocardium.  G1 DD Repeat echocardiogram from 02/18/2023 without significant aortic stenosis or other outflow obstruction, EF 55% mild AR/MR trivial posterior pericardial effusion  and no segmental/Regional wall motion abnormalities.  Carotid  artery Dopplers without hemodynamically significant stenosis -Called [employer] to interrogate patient's implanted loop recorder -Was advised that battery on loop recorder died back in December 09, 2021-This syncopal event was not captured on patient's  loop recorder as it is no longer functioning -Outpatient follow-up with cardiologist Dr. Royann Shivers and EP cardiologist Dr. Elberta Fortis advised -Patient has history of paroxysmal A-fib/flutter with episodes of RVR --Suspect probably had a similar episode   2)Fall at home -Physical therapy eval appreciated -Home health PT requested   3)Closed fracture of nasal bone Closed fracture of right orbital floor Closed fracture of maxillary sinus ENT was consulted and recommended outpatient follow-up per EDP -Patient reminded to follow-up as outpatient with ENT   4) paroxysmal A-fib/flutter with episodes of RVR - -please see discussion above 1  -Chads vascular score is 6 -Continue Eliquis 2.5 mg twice daily -Continue low-dose metoprolol 25 mg twice daily -May use Cardizem as needed elevated heart rate -Outpatient  follow-up with cardiology as above #1   5)Acquired hypothyroidism -Patient TSH WNL -Patient follows with Dr. Delice Bison Continue Synthroid  6)H/o CAD----status post prior angioplasty and stenting, status post prior CABG -Chest pain-free -Troponin and EKG not suggestive of ACS -Continue Crestor and metoprolol -No aspirin as patient is on Eliquis as above   Discharge Condition: stable  Follow UP   Follow-up Information     Suzanna Obey, MD. Schedule an appointment as soon as possible for a visit in 1 week(s).   Specialty: Otolaryngology Contact information: [address] [place] [address] [place] [address] [phone]         Read Drivers, MD. Schedule an appointment as soon as possible for a visit in 1 week(s).   Specialty: Otolaryngology Contact information: [address] [place] [address] [phone]         Croitoru, Rachelle Hora, MD. Schedule an appointment as soon as possible for a visit in 1 week(s).   Specialty: Cardiology Contact information: [address] [place] [address] [phone]         Regan Lemming, MD. Schedule an appointment as soon as possible for a visit in 1 week(s).   Specialty: Cardiology Why: For Loop recorder placement Contact information: [address] [place] [address] [phone]                 Consults obtained - ENT  Diet and Activity recommendation:  As advised  Discharge Instructions     Discharge Instructions     Call MD for:  difficulty breathing, headache or visual disturbances   Complete by: As directed    Call MD for:  persistant dizziness or light-headedness   Complete by: As directed    Call MD for:  persistant nausea and vomiting   Complete by: As directed    Call MD for:  temperature >100.4   Complete by: As directed    Diet - low sodium heart healthy   Complete by: As directed    Discharge instructions   Complete by: As directed    1)follow  up with your cardiologist Dr. Royann Shivers and your EP cardiologist Dr. Elberta Fortis as soon as possible to change the battery or replace your loop recorder device 2) follow-up with ear nose and throat doctor for your nasal and facial bone injuries -please call [facility] [address]----at [address], [place],  [place]  [address] Phone # [phone]   Increase activity slowly   Complete by: As directed  Discharge Medications     Allergies as of 02/18/2023       Reactions   Sulfa Antibiotics Swelling        Medication List     TAKE these medications    acetaminophen 500 MG tablet Commonly known as: TYLENOL Take 500-1,000 mg by mouth every 6 (six) hours as needed for moderate pain.   apixaban 2.5 MG Tabs tablet Commonly known as: Eliquis Take 1 tablet (2.5 mg total) by mouth 2 (two) times daily.   [employer] D3 125 MCG (5000 UT) capsule Generic drug: Cholecalciferol TAKE 1 CAPSULE BY MOUTH EVERY DAY   diltiazem 30 MG tablet Commonly known as: CARDIZEM Take 1 tablet (30 mg total) by mouth every 6 (six) hours as needed (for rapid heart rate over 120 bpm). What changed: reasons to take this   HYDROcodone-acetaminophen 5-325 MG tablet Commonly known as: Norco Take 1 tablet by mouth every 6 (six) hours as needed for moderate pain.   isosorbide mononitrate 30 MG 24 hr tablet Commonly known as: IMDUR Take 1 tablet (30 mg total) by mouth daily. TAKE 1 TABLET (60 MG TOTAL) DAILY BY MOUTH. What changed:  medication strength how much to take how to take this when to take this   levothyroxine 75 MCG tablet Commonly known as: SYNTHROID Take 1 tablet (75 mcg total) by mouth daily before breakfast.   metoprolol tartrate 25 MG tablet Commonly known as: LOPRESSOR Take 1 tablet (25 mg total) by mouth 2 (two) times daily.   nitroGLYCERIN 0.4 MG SL tablet Commonly known as: NITROSTAT PLACE 1 TABLET UNDER THE TONGUE EVERY 5 MINUTES AS NEEDED FOR CHEST  PAIN.   potassium chloride 10 MEQ tablet Commonly known as: KLOR-CON Take 1 tablet (10 mEq total) by mouth daily.   RECLAST IV Inject into the vein. Once yearly   rosuvastatin 20 MG tablet Commonly known as: CRESTOR Take 1 tablet (20 mg total) by mouth daily.        Major procedures and Radiology Reports - PLEASE review detailed and final reports for all details, in brief -    US Carotid Bilateral  Result Date: 02/18/2023 CLINICAL DATA:  82 year old female with a history of syncope EXAM: BILATERAL CAROTID DUPLEX ULTRASOUND TECHNIQUE: Wallace Cullens scale imaging, color Doppler and duplex ultrasound were performed of bilateral carotid and vertebral arteries in the neck. COMPARISON:  None Available. FINDINGS: Criteria: Quantification of carotid stenosis is based on velocity parameters that correlate the residual internal carotid diameter with NASCET-based stenosis levels, using the diameter of the distal internal carotid lumen as the denominator for stenosis measurement. The following velocity measurements were obtained: RIGHT ICA:  Systolic 132 cm/sec, Diastolic 37 cm/sec CCA:  146 cm/sec SYSTOLIC ICA/CCA RATIO:  0.9 ECA:  135 cm/sec LEFT ICA:  Systolic 113 cm/sec, Diastolic 27 cm/sec CCA:  109 cm/sec SYSTOLIC ICA/CCA RATIO:  1.0 ECA:  126 cm/sec Right Brachial SBP: Not acquired Left Brachial SBP: Not acquired RIGHT CAROTID ARTERY: No significant calcifications of the right common carotid artery. Intermediate waveform maintained. Heterogeneous and partially calcified plaque at the right carotid bifurcation. No significant lumen shadowing. Low resistance waveform of the right ICA. No significant tortuosity. RIGHT VERTEBRAL ARTERY: Antegrade flow with low resistance waveform. LEFT CAROTID ARTERY: No significant calcifications of the left common carotid artery. Intermediate waveform maintained. Heterogeneous and partially calcified plaque at the left carotid bifurcation. No significant lumen shadowing. Low  resistance waveform of the left ICA. No significant tortuosity. LEFT VERTEBRAL ARTERY:  Antegrade flow with low  resistance waveform. IMPRESSION: Color duplex indicates minimal heterogeneous and calcified plaque, with no hemodynamically significant stenosis by duplex criteria in the extracranial cerebrovascular circulation. Signed, Yvone Neu. Miachel Roux, RPVI [facility] Electronically Signed   By: Gilmer Mor D.O.   On: 02/18/2023 16:54   DG Foot Complete Left  Result Date: 02/17/2023 CLINICAL DATA:  Pain. EXAM: LEFT FOOT - COMPLETE 3+ VIEW COMPARISON:  None Available. FINDINGS: Postop changes consistent with bunion repair including a healed first metatarsal osteotomy and K-wire fixation. No evidence of acute fracture or dislocation in the left foot. No focal bone lesion or bone destruction. Mild medial subluxation at the second metatarsal-phalangeal joint is nonspecific but likely chronic. Soft tissues are unremarkable. IMPRESSION: No acute bony abnormalities. Old left first metatarsal bunion repair. Subluxation at the second metatarsal-phalangeal joint is likely chronic. Electronically Signed   By: Burman Nieves M.D.   On: 02/17/2023 22:34   DG Ankle Complete Left  Result Date: 02/17/2023 CLINICAL DATA:  Pain. EXAM: LEFT ANKLE COMPLETE - 3+ VIEW COMPARISON:  None Available. FINDINGS: There is no evidence of fracture, dislocation, or joint effusion. There is no evidence of arthropathy or other focal bone abnormality. Soft tissues are unremarkable. IMPRESSION: Negative. Electronically Signed   By: Burman Nieves M.D.   On: 02/17/2023 22:33   CT Maxillofacial Wo Contrast  Result Date: 02/17/2023 CLINICAL DATA:  Blunt facial trauma.  Syncope with fall.  Headache. EXAM: CT MAXILLOFACIAL WITHOUT CONTRAST TECHNIQUE: Multidetector CT imaging of the maxillofacial structures was performed. Multiplanar CT image reconstructions were also  generated. RADIATION DOSE REDUCTION: This exam was performed according to the departmental dose-optimization program which includes automated exposure control, adjustment of the mA and/or kV according to patient size and/or use of iterative reconstruction technique. COMPARISON:  CT head 08/22/2022 FINDINGS: Osseous: Comminuted and mildly depressed fractures of the anterior nasal bones. Nasal septum appears intact. Fractures of the anterior, lateral, and medial wall of the right maxillary antrum with mild depression of the fracture fragments. Nondepressed fractures of the right inferior orbital wall. Degenerative changes in the temporomandibular joints. Orbits: The globes and extraocular muscles appear intact and symmetrical. Small amount of extraconal gas along the inferior and medial aspect of the right orbit. No retrobulbar infiltration. Sinuses: Opacification and air-fluid levels in the right maxillary antrum and right sphenoid sinus. Mastoid air cells are clear. Soft tissues: Mild soft tissue swelling/hematoma and soft tissue gas inferior to the right orbit. Limited intracranial: See additional report of CT head. IMPRESSION: 1. Comminuted and mildly depressed anterior nasal bone fractures. 2. Fractures of the anterior, lateral, and medial right maxillary antral wall with mild depression of fracture fragments. Associated air-fluid levels in the right paranasal sinuses. 3. Nondepressed fractures of the right inferior orbital wall. Associated extraconal gas in the right orbit. Small soft tissue hematoma and gas in the subcutaneous fat inferior to the right orbit. Electronically Signed   By: Burman Nieves M.D.   On: 02/17/2023 19:50   CT CERVICAL SPINE WO CONTRAST  Result Date: 02/17/2023 CLINICAL DATA:  Poly trauma, blunt. Syncopal episode with fall. Headache. EXAM: CT CERVICAL SPINE WITHOUT CONTRAST TECHNIQUE: Multidetector CT imaging of the cervical spine was performed without intravenous contrast.  Multiplanar CT image reconstructions were also generated. RADIATION DOSE REDUCTION: This exam was performed according to the departmental dose-optimization program which includes automated exposure control, adjustment of the mA and/or kV according to patient size and/or use of iterative reconstruction technique. COMPARISON:  Cervical  spine radiographs 06/19/2019. CT cervical spine 05/14/2018 FINDINGS: Alignment: Normal alignment. Skull base and vertebrae: Skull base appears intact. No vertebral compression deformities. No focal bone lesion or bone destruction. Soft tissues and spinal canal: No prevertebral soft tissue swelling. No abnormal paraspinal soft tissue mass or infiltration. Disc levels: Postoperative changes with anterior plate and screw fixation and intervertebral fusion from C4 through C6. Degenerative changes in the remaining interspace levels with prominent osteophyte formation. Prominent degenerative changes in the facet joints. Uncovertebral and facet joint osteophytes cause bone encroachment upon the neural foramina bilaterally. Upper chest: Left apical calcification. Lung apices are clear. Partially calcified left thyroid gland nodules, largest measuring 1.7 cm diameter. No change since prior study from 10/06/2016. Other: None. IMPRESSION: 1. Normal alignment of the cervical spine. No acute displaced fractures identified. 2. Anterior fixation and intervertebral fusion from C4 through C6. Degenerative changes within the remainder of the cervical spine and in the facet joints. 3. Calcified left thyroid gland nodule. No change since previous study. Stability for greater than 5 years implies benignity; no biopsy or followup indicated (ref: J Am Coll Radiol. 2015 Feb;12(2): 143-50). Electronically Signed   By: Burman Nieves M.D.   On: 02/17/2023 19:46   CT HEAD WO CONTRAST  Result Date: 02/17/2023 CLINICAL DATA:  Head trauma, moderate to severe. Syncope with fall. Headache. EXAM: CT HEAD WITHOUT  CONTRAST TECHNIQUE: Contiguous axial images were obtained from the base of the skull through the vertex without intravenous contrast. RADIATION DOSE REDUCTION: This exam was performed according to the departmental dose-optimization program which includes automated exposure control, adjustment of the mA and/or kV according to patient size and/or use of iterative reconstruction technique. COMPARISON:  MRI brain 09/23/2022.  CT head 08/22/2022 FINDINGS: Brain: No evidence of acute infarction, hemorrhage, hydrocephalus, extra-axial collection or mass lesion/mass effect. Mild diffuse cerebral atrophy. Mild low-attenuation changes in the deep white matter consistent with small vessel ischemia. Vascular: No hyperdense vessel or unexpected calcification. Skull: Calvarium appears intact. Sinuses/Orbits: See additional report of CT maxillofacial. Other: None. IMPRESSION: No acute intracranial abnormalities. Mild diffuse cerebral atrophy and small vessel ischemic changes. Electronically Signed   By: Burman Nieves M.D.   On: 02/17/2023 19:40   DG Pelvis Portable  Result Date: 02/17/2023 CLINICAL DATA:  Trauma.  Syncope with fall. EXAM: PORTABLE PELVIS 1-2 VIEWS COMPARISON:  None Available. FINDINGS: Degenerative changes in the lower lumbar spine. Pelvis and visualized hips appear intact. No acute displaced fractures identified. No focal bone lesion or bone destruction. SI joints and symphysis pubis are not displaced. Soft tissues are unremarkable. IMPRESSION: No acute bony abnormalities.  Mild degenerative changes. Electronically Signed   By: Burman Nieves M.D.   On: 02/17/2023 19:37   DG Chest Port 1 View  Result Date: 02/17/2023 CLINICAL DATA:  Trauma.  Syncope and fall. EXAM: PORTABLE CHEST 1 VIEW COMPARISON:  11/30/2021 FINDINGS: Postoperative changes in the mediastinum and lower cervical spine. A loop recorder is present. Mild cardiac enlargement. Lungs are clear. No pleural effusions. No pneumothorax.  Mediastinal contours appear intact. Calcified and tortuous aorta. Degenerative changes in the spine and shoulders. IMPRESSION: Cardiac enlargement.  No evidence of active pulmonary disease. Electronically Signed   By: Burman Nieves M.D.   On: 02/17/2023 19:35   DG Hand Complete Right  Result Date: 02/17/2023 CLINICAL DATA:  Right hand pain after a fall. EXAM: RIGHT HAND - COMPLETE 3+ VIEW COMPARISON:  None Available. FINDINGS: Prominent diffuse degenerative changes throughout the interphalangeal joints as well as in  the STT joints of the right wrist. No evidence of acute fracture or dislocation. No focal bone lesion or bone destruction. Soft tissues are unremarkable. IMPRESSION: Prominent degenerative changes in the right hand. No acute bony abnormalities. Electronically Signed   By: Burman Nieves M.D.   On: 02/17/2023 19:34    Today   Subjective    Evelyn [name] today has no new complaints        -Ambulating without dyspnea on exertion no chest pains or palpitations or dizziness -Eating and drinking well   Patient has been seen and examined prior to discharge   Objective   Blood pressure 135/80, pulse 97, temperature 98.1 F (36.7 C), temperature source Oral, resp. rate 20, height 5\' 2"  (1.575 m), weight 51.9 kg, SpO2 100%.   Intake/Output Summary (Last 24 hours) at 02/18/2023 1742 Last data filed at 02/18/2023 0900 Gross per 24 hour  Intake 120 ml  Output --  Net 120 ml    Exam Gen:- Awake Alert, no acute distress  HEENT:- [place].AT, No sclera icterus Neck-Supple Neck,No JVD,.  Lungs-  CTAB , good air movement bilaterally CV- S1, S2 normal, irregular, but not irregularly irregular Abd-  +ve B.Sounds, Abd Soft, No tenderness, right-sided facial contusion right wrist contusion Extremity/Skin:- No  edema,   good pulses Psych-affect is appropriate, oriented x3 Neuro-no new focal deficits, no tremors    Data Review   CBC w Diff:  Lab Results  Component Value Date   WBC  7.9 02/17/2023   HGB 12.5 02/17/2023   HGB 13.2 01/01/2022   HCT 37.7 02/17/2023   HCT 40.8 01/01/2022   PLT 199 02/17/2023   PLT 220 01/01/2022   LYMPHOPCT 22 08/22/2022   MONOPCT 6 08/22/2022   EOSPCT 0 08/22/2022   BASOPCT 0 08/22/2022    CMP:  Lab Results  Component Value Date   NA 138 02/17/2023   NA 143 12/21/2022   K 3.5 02/17/2023   CL 106 02/17/2023   CO2 20 (L) 02/17/2023   BUN 12 02/17/2023   BUN 8 12/21/2022   CREATININE 0.72 02/17/2023   CREATININE 0.59 07/11/2014   PROT 6.7 02/17/2023   PROT 6.8 12/21/2022   ALBUMIN 3.6 02/17/2023   ALBUMIN 4.3 12/21/2022   BILITOT 0.4 02/17/2023   BILITOT 0.4 12/21/2022   ALKPHOS 45 02/17/2023   AST 21 02/17/2023   ALT 16 02/17/2023  .  Total Discharge time is about 33 minutes  Shon Hale M.D on 02/18/2023 at 5:42 PM  Go to [email or web address] -  for contact info  [facility] - Office  [phone]

## 2023-02-22 ENCOUNTER — Encounter: Payer: Self-pay | Admitting: Nurse Practitioner

## 2023-02-22 ENCOUNTER — Ambulatory Visit: Payer: Medicare HMO | Admitting: Nurse Practitioner

## 2023-02-22 DIAGNOSIS — Z23 Encounter for immunization: Secondary | ICD-10-CM | POA: Diagnosis not present

## 2023-02-22 DIAGNOSIS — S0292XD Unspecified fracture of facial bones, subsequent encounter for fracture with routine healing: Secondary | ICD-10-CM

## 2023-02-22 DIAGNOSIS — Z09 Encounter for follow-up examination after completed treatment for conditions other than malignant neoplasm: Secondary | ICD-10-CM | POA: Diagnosis not present

## 2023-02-22 MED ORDER — ISOSORBIDE MONONITRATE ER 60 MG PO TB24: 60.0000 mg | ORAL_TABLET | Freq: Every day | ORAL | 1 refills | Status: DC

## 2023-02-22 NOTE — Patient Instructions (Signed)
Lymphadenopathy  Lymphadenopathy is when your lymph glands are swollen or larger than normal.  Lymph glands, also called lymph nodes, are clumps of tissue. They filter germs and waste from tissues in your body to your bloodstream. They're part of your body's defense system, or immune system. Lymphadenopathy has different causes, like infection, autoimmune disease, and cancer. Lymphadenopathy can happen wherever you have lymph nodes. The type you have depends on which nodes it's in, such as: Cervical lymphadenopathy. This is in the neck. Mediastinal lymphadenopathy. This is in the chest. Hilar lymphadenopathy. This is in the lungs. Axillary lymphadenopathy. This is in the armpits. Inguinal lymphadenopathy. This is in the groin. Sometimes, fluid and cells that fight infection build up in your lymph nodes. This happens when your immune system reacts to germs or other substances that get into your body. This makes lymph nodes swell and get bigger. Treatment is based on what's thought to be the cause. Sometimes, lymph nodes don't go back to normal size after treatment. If yours don't, your health care provider may order tests to help learn why your glands are still swollen and big. Follow these instructions at home:  Take over-the-counter and prescription medicines only as told by your provider. If you were prescribed antibiotics, do not stop using them, even if you start to feel better. If told, apply heat to swollen lymph nodes as told by your provider. Use the heat source that your provider recommends, such as a moist heat pack or a heating pad. Place a towel between your skin and the heat source. Leave the heat on only for the time told by your provider to avoid injury. If your skin turns bright red, remove the heat right away to prevent burns. The risk of burns is higher if you cannot feel pain, heat, or cold. Check your swollen lymph nodes every day for changes. Check other places where you have  lymph nodes as told. Check for changes such as: More swelling. Sudden growth in size. Redness or pain. Hardness. Contact a health care provider if: You have lymph nodes that: Are still swollen after 2 weeks. Have gotten bigger all of a sudden or the swelling spreads. Are red, painful, or hard. Fluid leaks from the skin near a swollen lymph node. You get a fever, chills, or night sweats. You feel tired. You have a sore throat. Your abdomen hurts. You lose weight without trying. This information is not intended to replace advice given to you by your health care provider. Make sure you discuss any questions you have with your health care provider. Document Revised: 07/28/2022 Document Reviewed: 07/28/2022 Elsevier Patient Education  2024 [employer].
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
## 2023-02-22 NOTE — Addendum Note (Signed)
Addended by: Bennie Pierini on: 02/22/2023 12:10 PM   Modules accepted: Level of Service

## 2023-02-22 NOTE — Addendum Note (Signed)
Addended by: Bennie Pierini on: 02/22/2023 12:10 PM   Modules accepted: Orders

## 2023-02-24 ENCOUNTER — Ambulatory Visit (INDEPENDENT_AMBULATORY_CARE_PROVIDER_SITE_OTHER): Payer: Medicare HMO | Admitting: Otolaryngology

## 2023-02-24 ENCOUNTER — Encounter (INDEPENDENT_AMBULATORY_CARE_PROVIDER_SITE_OTHER): Payer: Self-pay | Admitting: Otolaryngology

## 2023-02-24 VITALS — BP 159/67 | HR 61

## 2023-02-24 DIAGNOSIS — W19XXXA Unspecified fall, initial encounter: Secondary | ICD-10-CM | POA: Diagnosis not present

## 2023-02-24 DIAGNOSIS — S022XXA Fracture of nasal bones, initial encounter for closed fracture: Secondary | ICD-10-CM | POA: Diagnosis not present

## 2023-02-24 NOTE — Progress Notes (Signed)
Evelyn [name] is an 82 y.o. female.   Chief Complaint: nasal fracture HPI: hx of fall and has facial fractures. She has nasal and orbital. Both are not significantly deviated. She  has no complaints of diplopia,malocclusion or deviation of the dorsum. No vision changes  Past Medical History:  Diagnosis Date   Aneurysm (HCC)    Aortic insufficiency    Atrial fibrillation (HCC)    CAD (coronary artery disease)    CABG LIMA to LAD,vein graft to diagonal,vein graft to OM   Dyslipidemia    Essential hypertension 09/16/2013   GERD (gastroesophageal reflux disease)    Hypothyroid     Past Surgical History:  Procedure Laterality Date   ABDOMINAL HYSTERECTOMY     BACK SURGERY  2009   BACK SURGERY     CORONARY ANGIOPLASTY  2003   cutting balloon atherectomy in-stent stenosis of the LAD.   CORONARY ANGIOPLASTY WITH STENT PLACEMENT  2002   remote PCI & stenting LAD,   CORONARY ARTERY BYPASS GRAFT  2004   LIMA to LAD,vein graft to diagonal,vein graft to OM   FOOT SURGERY     KYPHOPLASTY N/A 11/24/2021   Procedure: LUMBAR ONE KYPHOPLASTY;  Surgeon: Barnett Abu, MD;  Location: [facility] OR;  Service: Neurosurgery;  Laterality: N/A;  LUMBAR ONE KYPHOPLASTY   LEFT HEART CATH AND CORS/GRAFTS ANGIOGRAPHY N/A 02/23/2017   Procedure: LEFT HEART CATH AND CORS/GRAFTS ANGIOGRAPHY;  Surgeon: Iran Ouch, MD;  Location: [facility] INVASIVE CV LAB;  Service: Cardiovascular;  Laterality: N/A;   LEFT HEART CATHETERIZATION WITH CORONARY ANGIOGRAM N/A 12/15/2011   Procedure: LEFT HEART CATHETERIZATION WITH CORONARY ANGIOGRAM;  Surgeon: Thurmon Fair, MD;  Location: [facility] CATH LAB;  Service: Cardiovascular;  Laterality: N/A;   LOOP RECORDER INSERTION N/A 06/14/2018   Procedure: LOOP RECORDER INSERTION;  Surgeon: Thurmon Fair, MD;  Location: [facility] INVASIVE CV [facility];  Service: Cardiovascular;  Laterality: N/A;   NECK SURGERY     NM MYOCAR PERF WALL MOTION  12/01/2011   normal study-persistent exercise induced ECG  changes raises the concern for ischemia (balanced ischemia).   NOSE SURGERY     PACEMAKER INSERTION     NOT PACEMAKER - LOOP RECORDER   TONSILLECTOMY  1948   TUBAL LIGATION      Family History  Problem Relation Age of Onset   Heart attack Mother    Heart failure Father    Hypertension Father    Heart disease Father    Asthma Maternal Grandmother    Heart disease Sister    Stroke Brother    Voice disorder Daughter        vocal dystonia   Stroke Daughter    Stroke Son    Cancer Sister 84   Social History:  reports that she has never smoked. She has never used smokeless tobacco. She reports that she does not drink alcohol and does not use drugs.  Allergies:  Allergies  Allergen Reactions   Sulfa Antibiotics Swelling    (Not in a hospital admission)   No results found for this or any previous visit (from the past 48 hour(s)). No results found.    Blood pressure (!) 159/67, pulse 61, SpO2 96%.  PHYSICAL EXAM: Appearance _ awake alert with no distress.  Head- atraumatic and no obvious abnormalities Eyes- PERRL, EOMI, no stepoffno conjunctiva injection or ecchymosis Ears-  Right- Pinna without inflammation or swelling. canal without obstruction or injury. TM within normal limits  Left- Pinna without inflammation or swelling. canal without obstruction  or injury. TM within normal limits Nose- no deviation of dorsum or septum no hematoma. no lesions or masses.  Oc/OP- no malocclusion,  lesions or excessive swelling. Mouth opening normal.  Hp/Larynx- normal voice and no airway issues. No swelling or lesions Neck- no mass or lesions. Normal movement.  Neuro- CNII-XII intact, no sensory deficits.  Lungs- normal effort no distress noted     Assessment/Plan Nasal/orbital  fracture- no indication for surgery for either. She has no deviation or concerns. Follow up as needed.  Suzanna Obey 02/24/2023, 9:31 AM

## 2023-02-28 ENCOUNTER — Ambulatory Visit: Payer: Medicare HMO | Admitting: Family Medicine

## 2023-02-28 ENCOUNTER — Encounter: Payer: Self-pay | Admitting: Family Medicine

## 2023-02-28 VITALS — BP 128/85 | HR 108 | Temp 97.7°F | Ht 62.0 in | Wt 112.6 lb

## 2023-02-28 DIAGNOSIS — S02401A Maxillary fracture, unspecified, initial encounter for closed fracture: Secondary | ICD-10-CM

## 2023-02-28 DIAGNOSIS — S0231XD Fracture of orbital floor, right side, subsequent encounter for fracture with routine healing: Secondary | ICD-10-CM | POA: Diagnosis not present

## 2023-02-28 DIAGNOSIS — K920 Hematemesis: Secondary | ICD-10-CM | POA: Diagnosis not present

## 2023-02-28 DIAGNOSIS — S022XXA Fracture of nasal bones, initial encounter for closed fracture: Secondary | ICD-10-CM

## 2023-02-28 DIAGNOSIS — S61216A Laceration without foreign body of right little finger without damage to nail, initial encounter: Secondary | ICD-10-CM

## 2023-02-28 NOTE — Progress Notes (Signed)
Chief Complaint  Patient presents with   Suture / Staple Removal    HPI  Patient presents today for removal of sutures placed at [facility] E[facility]. She was noted to have facial injuries as well as a laceration of the right 5th finger. She was seen by ENT 3 days ago due to the facial injuries including nasal bone, maxillary sinus and right orbital floor fractures. and has had intermittently coughed up blood since then. It seems to be diminishing, but still occurring. Put a call in to that office on 10/11 but hasn't heard back yet.   She denies dyspnea. No cough or congestion. Here today for removal of sutures at the right fifth finger.   PMH: Smoking status noted Review of Systems  Constitutional: Negative.   HENT: Negative.    Eyes:  Negative for visual disturbance.  Respiratory:  Negative for shortness of breath.   Cardiovascular:  Negative for chest pain.  Gastrointestinal:  Negative for abdominal pain.  Musculoskeletal:  Negative for arthralgias.     Objective: BP 128/85   Pulse (!) 108   Temp 97.7 F (36.5 C)   Ht 5\' 2"  (1.575 m)   Wt 112 lb 9.6 oz (51.1 kg)   SpO2 95%   BMI 20.59 kg/m  Gen: NAD, alert, cooperative with exam HEENT: NCAT, EOMI, PERRL. Nasal  passages clear, moderate enderness of cheeks and nose. The pharynx is free of blood. there is no bruising or facial edema CV: RRR, good S1/S2, no murmur Resp: CTABL, no wheezes, non-labored Ext: No edema, warm. Two sutures removed at R fifth finger. Wound inspected. No further sutures appreciated and no signs of infection noted. Wound healing without dehiscence. Neuro: Alert and oriented, No gross deficits  Assessment and plan:  1. Closed fracture of nasal bone, initial encounter   2. Closed fracture of right orbital floor with routine healing, subsequent encounter   3. Closed fracture of maxillary sinus, initial encounter (HCC)   4. Laceration of right little finger without damage to nail, foreign body presence  unspecified, initial encounter   5. Hematemesis without nausea     At this time there is no sign of ongoing bleeding on facial inspection. Pt was advised to follow up with ENT. She can gargle with salt water to loosen secretions as needed. Go to ED if bleeding recurs.   The sutures were removed uneventfully, wound care reviewed. Report signs of infection.    Follow up as needed.  Mechele Claude, MD

## 2023-03-02 ENCOUNTER — Telehealth: Payer: Self-pay | Admitting: Nurse Practitioner

## 2023-03-02 NOTE — Telephone Encounter (Signed)
  Prescription Request  03/02/2023  Is this a "Controlled Substance" medicine? yes  Have you seen your PCP in the last 2 weeks? Yes on 02/22/2023 by MMM, pt wants to know does she have to see her again to get refill on this med?  If YES, route message to pool  -  If NO, patient needs to be scheduled for appointment.  What is the name of the medication or equipment? hydrocodone  Have you contacted your pharmacy to request a refill? yes   Which pharmacy would you like this sent to? [facility]    Patient notified that their request is being sent to the clinical staff for review and that they should receive a response within 2 business days.

## 2023-03-03 MED ORDER — HYDROCODONE-ACETAMINOPHEN 5-325 MG PO TABS: 1.0000 | ORAL_TABLET | Freq: Two times a day (BID) | ORAL | 0 refills | Status: DC | PRN

## 2023-03-03 NOTE — Telephone Encounter (Signed)
Please review and advise.

## 2023-03-10 ENCOUNTER — Other Ambulatory Visit: Payer: Self-pay | Admitting: "Endocrinology

## 2023-03-11 ENCOUNTER — Other Ambulatory Visit: Payer: Self-pay | Admitting: Adult Health

## 2023-03-21 ENCOUNTER — Ambulatory Visit (INDEPENDENT_AMBULATORY_CARE_PROVIDER_SITE_OTHER): Payer: Medicare HMO

## 2023-03-21 DIAGNOSIS — I480 Paroxysmal atrial fibrillation: Secondary | ICD-10-CM | POA: Diagnosis not present

## 2023-03-21 DIAGNOSIS — I351 Nonrheumatic aortic (valve) insufficiency: Secondary | ICD-10-CM

## 2023-03-21 DIAGNOSIS — I25718 Atherosclerosis of autologous vein coronary artery bypass graft(s) with other forms of angina pectoris: Secondary | ICD-10-CM | POA: Diagnosis not present

## 2023-03-21 DIAGNOSIS — F419 Anxiety disorder, unspecified: Secondary | ICD-10-CM

## 2023-03-21 DIAGNOSIS — Z7901 Long term (current) use of anticoagulants: Secondary | ICD-10-CM | POA: Diagnosis not present

## 2023-03-21 DIAGNOSIS — I10 Essential (primary) hypertension: Secondary | ICD-10-CM

## 2023-03-21 DIAGNOSIS — E039 Hypothyroidism, unspecified: Secondary | ICD-10-CM

## 2023-03-21 DIAGNOSIS — I7121 Aneurysm of the ascending aorta, without rupture: Secondary | ICD-10-CM

## 2023-03-25 ENCOUNTER — Encounter: Payer: Self-pay | Admitting: Nurse Practitioner

## 2023-03-25 ENCOUNTER — Ambulatory Visit: Payer: Self-pay | Admitting: Nurse Practitioner

## 2023-03-25 ENCOUNTER — Ambulatory Visit (INDEPENDENT_AMBULATORY_CARE_PROVIDER_SITE_OTHER): Payer: Medicare HMO | Admitting: Nurse Practitioner

## 2023-03-25 VITALS — BP 151/76 | HR 63 | Temp 97.1°F | Resp 20 | Ht 62.0 in | Wt 114.0 lb

## 2023-03-25 DIAGNOSIS — L03011 Cellulitis of right finger: Secondary | ICD-10-CM | POA: Diagnosis not present

## 2023-03-25 MED ORDER — DOXYCYCLINE HYCLATE 100 MG PO TABS
100.0000 mg | ORAL_TABLET | Freq: Two times a day (BID) | ORAL | 0 refills | Status: DC
Start: 2023-03-25 — End: 2023-06-30

## 2023-03-25 NOTE — Progress Notes (Signed)
Subjective:    Patient ID: Evelyn [name], female    DOB: [birth year], 82 y.o.   MRN: [id number]   Chief Complaint: Finger hot and swelling   HPI  Patient come sin c/o right 5th finger pain. Is swollen and sore to the touch. She broke it when she fell several weeks ago. Has been sore every since. Rate [place] 6/10. Patient Active Problem List   Diagnosis Date Noted   Fall at home, initial encounter 02/18/2023   Fracture of nasal bone 02/18/2023   Fracture of right orbital floor (HCC) 02/18/2023   Fracture of maxillary sinus (HCC) 02/18/2023   Atrial fibrillation, chronic (HCC) 02/18/2023   Syncope 02/17/2023   Hypotension 11/25/2022   Intractable back pain 11/20/2021   Acquired thrombophilia (HCC) 11/20/2021   Hypokalemia 11/20/2021   Compression fracture of L1 vertebra (HCC) 11/20/2021   Paroxysmal atrial fibrillation (HCC) 12/18/2019   Cervical radiculopathy at C5 08/07/2019   Long term (current) use of anticoagulants 11/25/2018   DDD (degenerative disc disease), cervical 10/17/2018   Mixed hyperlipidemia 10/17/2018   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 CABG X 3 2004 12/15/2011   Hypothyroidism 12/15/2011       Review of Systems  Constitutional:  Negative for diaphoresis.  Eyes:  Negative for pain.  Respiratory:  Negative for shortness of breath.   Cardiovascular:  Negative for  chest pain, palpitations and leg swelling.  Gastrointestinal:  Negative for abdominal pain.  Endocrine: Negative for polydipsia.  Skin:  Negative for rash.  Neurological:  Negative for dizziness, weakness and headaches.  Hematological:  Does not bruise/bleed easily.  All other systems reviewed and are negative.      Objective:   Physical Exam Constitutional:      Appearance: Normal appearance.  Cardiovascular:     Rate and Rhythm: Normal rate and regular rhythm.     Heart sounds: Normal heart sounds.  Pulmonary:     Effort: Pulmonary effort is normal.     Breath sounds: Normal breath sounds.  Skin:    General: Skin is warm.     Comments: Erythema and edema of right fifth finger  Neurological:     General: No focal deficit present.     Mental Status: She is alert and oriented to person, place, and time.  Psychiatric:        Mood and Affect: Mood normal.        Behavior: Behavior normal.    BP (!) 151/76   Pulse 63   Temp (!) 97.1 F (36.2 C) (Temporal)   Resp 20   Ht 5\' 2"  (1.575 m)   Wt 114 lb (51.7 kg)   SpO2 96%   BMI 20.85 kg/m         Assessment & Plan:  Evelyn [name] in today with chief complaint of Finger hot and swelling   1. Cellulitis of finger of right hand Clean with antibacterial soap bid RTO prn - doxycycline (  VIBRA-TABS) 100 MG tablet; Take 1 tablet (100 mg total) by mouth 2 (two) times daily. 1 po bid  Dispense: 20 tablet; Refill: 0    The above assessment and management plan was discussed with the patient. The patient verbalized understanding of and has agreed to the management plan. Patient is aware to call the clinic if symptoms persist or worsen. Patient is aware when to return to the clinic for a follow-up visit. Patient educated on when it is appropriate to go to the emergency department.   Evelyn Daphine Deutscher, FNP

## 2023-03-25 NOTE — Telephone Encounter (Signed)
Copied from CRM [id number]. Topic: Clinical - Red Word Triage >> Mar 25, 2023  9:01 AM Georgeanna Harrison H wrote: Red Word that prompted transfer to Nurse Triage: Patient had stitches removed about 2 weeks ago in finger and states there was a question if all had been removed. She now says the finger is red, swollen, and painful and has red streaks.

## 2023-03-25 NOTE — Telephone Encounter (Signed)
Copied from CRM [id number]. Topic: Clinical - Red Word Triage >> Mar 25, 2023  9:01 AM Evelyn [name] wrote: Red Word that prompted transfer to Nurse Triage: Patient had stitches removed about 2 weeks ago in finger and states there was a question if all had been removed. She now says the finger is red, swollen, and painful and has red streaks   Reason for Disposition . [1] SEVERE pain (e.g., excruciating) AND [2] not improved after 2 hours of pain medicine  Protocols used: Finger Pain-A-AH

## 2023-04-01 ENCOUNTER — Ambulatory Visit: Payer: Medicare HMO | Admitting: Nurse Practitioner

## 2023-04-01 ENCOUNTER — Encounter: Payer: Self-pay | Admitting: Nurse Practitioner

## 2023-04-01 VITALS — BP 130/80 | HR 98 | Temp 96.8°F | Resp 20 | Ht 62.0 in | Wt 113.0 lb

## 2023-04-01 DIAGNOSIS — M5030 Other cervical disc degeneration, unspecified cervical region: Secondary | ICD-10-CM | POA: Diagnosis not present

## 2023-04-01 MED ORDER — HYDROCODONE-ACETAMINOPHEN 5-325 MG PO TABS: 1.0000 | ORAL_TABLET | Freq: Two times a day (BID) | ORAL | 0 refills | Status: DC | PRN

## 2023-04-01 MED ORDER — HYDROCODONE-ACETAMINOPHEN 5-325 MG PO TABS: 1.0000 | ORAL_TABLET | Freq: Two times a day (BID) | ORAL | 0 refills | Status: AC | PRN

## 2023-04-01 NOTE — Progress Notes (Signed)
Subjective:    Patient ID: Evelyn [name], female    DOB: December 11, 1940, 82 y.o.   MRN: [id number]   Chief Complaint: Pain Management   HPI  Pain assessment: Cause of pain- DDD Pain location- lower back Pain on scale of 1-10- 7-8/10 Frequency- daily What increases pain-nothing really What makes pain Better-rest helps Effects on ADL - none Any change in general medical condition-none  Current opioids rx- norco 5/325 BID # meds rx- 60 Effectiveness of current meds-helps Adverse reactions from pain meds-none Morphine equivalent-  Pill count performed-No Last drug screen - 08/23/22 ( high risk q45m, moderate risk q44m, low risk yearly ) Urine drug screen today- No Was the [facility] reviewed- yes  If yes were their any concerning findings? - no   Overdose risk: 1   Pain contract signed on:11/23/22  Patient Active Problem List   Diagnosis Date Noted   Fall at home, initial encounter 02/18/2023   Fracture of nasal bone 02/18/2023   Fracture of right orbital floor (HCC) 02/18/2023   Fracture of maxillary sinus (HCC) 02/18/2023   Atrial fibrillation, chronic (HCC) 02/18/2023   Syncope 02/17/2023   Hypotension 11/25/2022   Intractable back pain 11/20/2021   Acquired thrombophilia (HCC) 11/20/2021   Hypokalemia 11/20/2021   Compression fracture of L1 vertebra (HCC) 11/20/2021   Paroxysmal atrial fibrillation (HCC) 12/18/2019   Cervical radiculopathy at C5 08/07/2019   Long term (current) use of anticoagulants 11/25/2018   DDD (degenerative disc disease), cervical 10/17/2018   Mixed hyperlipidemia 10/17/2018   Hemispheric carotid artery syndrome 06/14/2018   Pain management 08/01/2017   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 CABG X 3 2004 12/15/2011   Hypothyroidism 12/15/2011       Review of Systems  Constitutional:  Negative for diaphoresis.  Eyes:  Negative for pain.  Respiratory:  Negative for shortness of breath.   Cardiovascular:  Negative for chest pain, palpitations and leg swelling.  Gastrointestinal:  Negative for abdominal pain.  Endocrine: Negative for polydipsia.  Musculoskeletal:  Positive for back pain.  Skin:  Negative for rash.  Neurological:  Negative for dizziness, weakness and headaches.  Hematological:  Does not bruise/bleed easily.  All other systems reviewed and are negative.      Objective:   Physical Exam Constitutional:      Appearance: Normal appearance.  Cardiovascular:     Rate and Rhythm: Normal rate and regular rhythm.     Heart sounds: Normal heart sounds.  Pulmonary:     Breath sounds: Normal breath sounds.  Musculoskeletal:     Comments: Rises slowly from sitting  to standing Lumbar pain on rotation and extension (-) SLR bil Motor strength and sensation distally intact  Skin:    General: Skin is warm.  Neurological:     General: No focal deficit present.     Mental Status: She is alert and oriented to person, place, and time.  Psychiatric:        Mood and Affect: Mood normal.  Behavior: Behavior normal.    BP 130/80   Pulse 98   Temp (!) 96.8 F (36 C) (Temporal)   Resp 20   Ht 5\' 2"  (1.575 m)   Wt 113 lb (51.3 kg)   SpO2 92%   BMI 20.67 kg/m         Assessment & Plan:   [name] in today with chief complaint of Pain Management   1. DDD (degenerative disc disease), cervical Moist heat Rest  RTO prn  Meds ordered this encounter  Medications    HYDROcodone-acetaminophen (NORCO/VICODIN) 5-325 MG tablet    Sig: Take 1 tablet by mouth 2 (two) times daily as needed for moderate pain (pain score 4-6).    Dispense:  60 tablet    Refill:  0    Order Specific Question:   Supervising Provider    Answer:   Arville Care A [[id number]]   HYDROcodone-acetaminophen (NORCO/VICODIN) 5-325 MG tablet    Sig: Take 1 tablet by mouth 2 (two) times daily as needed for moderate pain (pain score 4-6).    Dispense:  60 tablet    Refill:  0    Order Specific Question:   Supervising Provider    Answer:   Arville Care A [[id number]]   HYDROcodone-acetaminophen (NORCO/VICODIN) 5-325 MG tablet    Sig: Take 1 tablet by mouth 2 (two) times daily as needed for moderate pain (pain score 4-6).    Dispense:  60 tablet    Refill:  0    Order Specific Question:   Supervising Provider    Answer:   Arville Care A [[id number]]       The above assessment and management plan was discussed with the patient. The patient verbalized understanding of and has agreed to the management plan. Patient is aware to call the clinic if symptoms persist or worsen. Patient is aware when to return to the clinic for a follow-up visit. Patient educated on when it is appropriate to go to the emergency department.   Evelyn Daphine Deutscher, FNP

## 2023-04-04 ENCOUNTER — Ambulatory Visit: Payer: Medicare HMO | Attending: Cardiology | Admitting: Cardiology

## 2023-04-04 ENCOUNTER — Encounter: Payer: Self-pay | Admitting: Cardiology

## 2023-04-04 VITALS — BP 130/70 | HR 64 | Ht 62.0 in | Wt 113.6 lb

## 2023-04-04 DIAGNOSIS — R55 Syncope and collapse: Secondary | ICD-10-CM | POA: Diagnosis not present

## 2023-04-04 DIAGNOSIS — I480 Paroxysmal atrial fibrillation: Secondary | ICD-10-CM | POA: Diagnosis not present

## 2023-04-04 DIAGNOSIS — I2510 Atherosclerotic heart disease of native coronary artery without angina pectoris: Secondary | ICD-10-CM

## 2023-04-04 DIAGNOSIS — I639 Cerebral infarction, unspecified: Secondary | ICD-10-CM | POA: Diagnosis not present

## 2023-04-04 DIAGNOSIS — D6869 Other thrombophilia: Secondary | ICD-10-CM

## 2023-04-04 NOTE — Patient Instructions (Addendum)
Medication Instructions:  Your physician recommends that you continue on your current medications as directed. Please refer to the Current Medication list given to you today.  *If you need a refill on your cardiac medications before your next appointment, please call your pharmacy*   Lab Work: None ordered   Testing/Procedures: None ordered   Follow-Up: At [facility], you and your health needs are our priority.  As part of our continuing mission to provide you with exceptional heart care, we have created designated Provider Care Teams.  These Care Teams include your primary Cardiologist (physician) and Advanced Practice Providers (APPs -  Physician Assistants and Nurse Practitioners) who all work together to provide you with the care you need, when you need it.  Your next appointment:   Next available   The format for your next appointment:   In Person  Provider:      Thank you for choosing [facility]!!   Dory Horn, RN [phone]

## 2023-04-04 NOTE — Progress Notes (Signed)
   Electrophysiology Office Note:   Date:  04/04/2023  ID:  NEERA DOGAN, DOB Oct 28, 1940, MRN [id number]  Primary Cardiologist: Thurmon Fair, MD Electrophysiologist: Regan Lemming, MD      History of Present Illness:   Evelyn [name] is a 82 y.o. female with h/o atrial fibrillation/flutter, coronary artery disease post CABG, TIA, hypertension, hyperlipidemia, aortic insufficiency with aortic aneurysm seen today for routine electrophysiology followup.   Since last being seen in our clinic the patient reports doing well today.  She did have an episode of syncope October 2024.  She had been out running errands.  She was at home when her episode occurred.  She felt chest tightness.  She was taking her groceries out of the car and had a sudden syncopal episode.  She had a fracture nasal bone and fracture of the anterolateral medial maxillary antral wall depressed fractures.  Since then, she has done well.  She has no longer having pain.  She has had no further syncopal episodes.  she denies chest pain, palpitations, dyspnea, PND, orthopnea, nausea, vomiting, dizziness, syncope, edema, weight gain, or early satiety.   Review of systems complete and found to be negative unless listed in HPI.   Device History: Medtronic loop recorder implanted 06/14/2018 for Cryptogenic Stroke  EP Information / Studies Reviewed:    EKG is not ordered today. EKG from 02/17/23 reviewed which showed sinus rhythm        Risk Assessment/Calculations:    CHA2DS2-VASc Score = 7   This indicates a 11.2% annual risk of stroke. The patient's score is based upon: CHF History: 0 HTN History: 1 Diabetes History: 0 Stroke History: 2 Vascular Disease History: 1 Age Score: 2 Gender Score: 1             Physical Exam:   VS:  BP 130/70 (BP Location: Left Arm, Patient Position: Sitting, Cuff Size: Normal)   Pulse 64   Ht 5\' 2"  (1.575 m)   Wt 113 lb 9.6 oz (51.5 kg)   SpO2 97%   BMI 20.78 kg/m    Wt  Readings from Last 3 Encounters:  04/04/23 113 lb 9.6 oz (51.5 kg)  04/01/23 113 lb (51.3 kg)  03/25/23 114 lb (51.7 kg)     GEN: Well nourished, well developed in no acute distress NECK: No JVD; No carotid bruits CARDIAC: Regular rate and rhythm, no murmurs, rubs, gallops RESPIRATORY:  Clear to auscultation without rales, wheezing or rhonchi  ABDOMEN: Soft, non-tender, non-distended EXTREMITIES:  No edema; No deformity   ILR Interrogation- reviewed in detail today,  See PACEART report  ASSESSMENT AND PLAN:    Cryptogenic Stroke s/p Medtronic Loop recorder Normal device function See Pace Art report No changes today  2.  Paroxysmal atrial fibrillation: Currently on Eliquis and metoprolol.  Minimal episodes.  3.  Secondary hypercoagulable state: Currently on Eliquis for atrial fibrillation  4.  Coronary artery disease: Post CABG.  No current chest pain.  5.  Hyperlipidemia: Continue Crestor per primary cardiology  6.  Syncope: Unclear cause to her syncope.  She has an ILR, though it was not recording time as the battery has run out.  She would benefit from explant and reimplant.     Follow up with Dr. Elberta Fortis in 2 weeks  Signed, Mathius Birkeland Jorja Loa, MD

## 2023-04-05 NOTE — Progress Notes (Unsigned)
Cardiology Office Note:  .   Date:  04/07/2023  ID:  Evelyn [name], DOB January 28, 1941, MRN [id number] PCP: Bennie Pierini, FNP  [facility] Providers Cardiologist:  Thurmon Fair, MD Electrophysiologist:  Will Jorja Loa, MD }   History of Present Illness: .   Evelyn [name] is a 82 y.o. female with h/o atrial fibrillation/flutter, coronary artery disease post CABG,(LIMA to LAD, SVG to diagonal, SVG to OM in 2004);  TIA, ILR in situ, hypertension, hyperlipidemia, aortic insufficiency. She was not found to be a candidate for aortic valve replacement with history of previous sternotomy. Continues on Eliquis and metoprolol. When last seen by me on 12/14/2022 she was having some rapid HR and did take extra dose of diltiazem and NTG which resolved her symptoms.  She also had complaints of chest discomfort.  Consideration for cardiac PET scan if she continued to be symptomatic. When I saw her last, decreased her dose of metoprolol to 25 mg BID due to bradycardia and weakness. Saw EP on 04/03/2023 at which time she was asymptomatic from a cardiac standpoint. She did report an episode of syncope in October 2024.  She had a fracture nasal bone and fracture of the anterolateral medial maxillary antral wall depressed fractures. Recommendation for explant and reimplant of ILR due to battery running out was made by EP.  She is awaiting insurance approval to move forward.  She comes today feeling "about the same" but has not had any more syncopal episodes.  She states that she occasionally feels her heart racing but it is short-lived.  She states that during the time she passed out she was having chest pain and rapid heart rate for several hours.  Since that time she has not had recurrence of intensity of irregular heart rhythm.  She remains medically compliant.  She also asked about a follow-up CT scan for AAA.  ROS: As above otherwise negative.  Studies Reviewed: .      Echocardiogram  02/18/2023 1. Left ventricular ejection fraction, by estimation, is 55% . The left ventricle has normal function. Left ventricular endocardial border not optimally defined to evaluate regional wall motion. Left ventricular diastolic parameters are indeterminate.  2. Right ventricular systolic function is normal. The right ventricular size is normal. There is moderately elevated pulmonary artery systolic pressure. 3. Left atrial size was mildly dilated.  4. Right atrial size was mildly dilated.  5. The mitral valve is abnormal. Mild mitral valve regurgitation.  6. Tricuspid valve regurgitation is moderate.  7. The aortic valve is tricuspid. There is moderate calcification of the aortic valve. There is moderate thickening of the aortic valve. Aortic valve regurgitation is mild. Aortic valve sclerosis is present, with no evidence of aortic valve stenosis.   Physical Exam:   VS:  BP 122/64   Pulse (!) 55   Ht 5\' 2"  (1.575 m)   Wt 114 lb 6.4 oz (51.9 kg)   SpO2 96%   BMI 20.92 kg/m    Wt Readings from Last 3 Encounters:  04/07/23 114 lb 6.4 oz (51.9 kg)  04/04/23 113 lb 9.6 oz (51.5 kg)  04/01/23 113 lb (51.3 kg)    GEN: Well nourished, well developed in no acute distress NECK: No JVD; No carotid bruits CARDIAC: IRRR, soft systolic murmur, left sternal border, rubs, gallops RESPIRATORY:  Clear to auscultation without rales, wheezing or rhonchi  ABDOMEN: Soft, non-tender, non-distended EXTREMITIES:  No edema; No deformity   ASSESSMENT AND PLAN: .    Atrial  fibrillation: She occasionally has racing heart rate, with associated chest pain.  She has not had any severe episodes since being seen in the ED for same.  She is awaiting insurance approval for ILR replacement due to dead battery.  The patient currently is asymptomatic.  I have advised her if she has a rapid heart rate that lasts longer than 1 hour she is to take an additional metoprolol 25 mg.  She will continue on metoprolol 25 mg daily  for now as her fatigue has improved on lower dose.  Continue apixaban 2.5 mg twice daily.  2.  Coronary artery disease: History of CABG with LIMA to LAD, SVG to diagonal, SVG to OM; most recent cardiac catheterization 02/23/2017, single-vessel coronary artery disease with chronically occluded stents to the LAD, she is asymptomatic concerning chest pain currently.  She only has chest pain when her heart rate is very elevated.  3.  Hypercholesterolemia: Goal of LDL less than 70.  Remains on rosuvastatin 20 mg daily.  Most recent labs in July 2024 LDL 44, HDL 58, total cholesterol 295.  4.  Hypertension: Blood pressure is very well-controlled on current medication regimen.  No changes.  5.  AAA: Repeat CT angio with and without contrast for ongoing surveillance.  Most recent was in 2021 with no changes in aortic dimension.  BMET drawn today.           Signed, Evelyn [name]. Evelyn [name], ANP, AACC

## 2023-04-07 ENCOUNTER — Encounter: Payer: Self-pay | Admitting: Adult Health

## 2023-04-07 ENCOUNTER — Ambulatory Visit: Payer: Medicare HMO | Attending: Adult Health | Admitting: Adult Health

## 2023-04-07 VITALS — BP 122/64 | HR 55 | Ht 62.0 in | Wt 114.4 lb

## 2023-04-07 DIAGNOSIS — I2510 Atherosclerotic heart disease of native coronary artery without angina pectoris: Secondary | ICD-10-CM

## 2023-04-07 DIAGNOSIS — I480 Paroxysmal atrial fibrillation: Secondary | ICD-10-CM

## 2023-04-07 DIAGNOSIS — I7121 Aneurysm of the ascending aorta, without rupture: Secondary | ICD-10-CM

## 2023-04-07 DIAGNOSIS — I252 Old myocardial infarction: Secondary | ICD-10-CM

## 2023-04-07 DIAGNOSIS — I10 Essential (primary) hypertension: Secondary | ICD-10-CM | POA: Diagnosis not present

## 2023-04-07 DIAGNOSIS — E7800 Pure hypercholesterolemia, unspecified: Secondary | ICD-10-CM

## 2023-04-07 NOTE — Patient Instructions (Addendum)
Medication Instructions:  No Changes *If you need a refill on your cardiac medications before your next appointment, please call your pharmacy*   Lab Work: BMET If you have labs (blood work) drawn today and your tests are completely normal, you will receive your results only by: MyChart Message (if you have MyChart) OR A paper copy in the mail If you have any lab test that is abnormal or we need to change your treatment, we will call you to review the results.   Testing/Procedures: [facility] Non-Cardiac CT scanning, (CAT scanning), is a noninvasive, special x-ray that produces cross-sectional images of the body using x-rays and a computer. CT scans help physicians diagnose and treat medical conditions. For some CT exams, a contrast material is used to enhance visibility in the area of the body being studied. CT scans provide greater clarity and reveal more details than regular x-ray exams.    Follow-Up: At [facility], you and your health needs are our priority.  As part of our continuing mission to provide you with exceptional heart care, we have created designated Provider Care Teams.  These Care Teams include your primary Cardiologist (physician) and Advanced Practice Providers (APPs -  Physician Assistants and Nurse Practitioners) who all work together to provide you with the care you need, when you need it.  We recommend signing up for the patient portal called "MyChart".  Sign up information is provided on this After Visit Summary.  MyChart is used to connect with patients for Virtual Visits (Telemedicine).  Patients are able to view lab/test results, encounter notes, upcoming appointments, etc.  Non-urgent messages can be sent to your provider as well.   To learn more about what you can do with MyChart, go to [email or web address].    Your next appointment:   4 month(s)  Provider:   Thurmon Fair, MD

## 2023-04-08 LAB — BASIC METABOLIC PANEL
BUN/Creatinine Ratio: 17 (ref 12–28)
BUN: 11 mg/dL (ref 8–27)
CO2: 23 mmol/L (ref 20–29)
Calcium: 9.5 mg/dL (ref 8.7–10.3)
Chloride: 104 mmol/L (ref 96–106)
Creatinine, Ser: 0.65 mg/dL (ref 0.57–1.00)
Glucose: 82 mg/dL (ref 70–99)
Potassium: 4.6 mmol/L (ref 3.5–5.2)
Sodium: 141 mmol/L (ref 134–144)
eGFR: 87 mL/min/{1.73_m2} (ref >59)

## 2023-04-11 ENCOUNTER — Telehealth: Payer: Self-pay

## 2023-04-11 NOTE — Telephone Encounter (Addendum)
Called patient regarding results. Patient had understanding of results.----- Message from Joni Reining sent at 04/08/2023  7:24 AM EST ----- I have reviewed her labs. All are normal. Can move forward with testing.

## 2023-04-22 ENCOUNTER — Other Ambulatory Visit: Payer: Self-pay | Admitting: Nurse Practitioner

## 2023-04-22 DIAGNOSIS — E782 Mixed hyperlipidemia: Secondary | ICD-10-CM

## 2023-04-25 ENCOUNTER — Ambulatory Visit (HOSPITAL_COMMUNITY): Admission: RE | Admit: 2023-04-25 | Payer: Medicare HMO | Source: Ambulatory Visit

## 2023-05-08 ENCOUNTER — Other Ambulatory Visit: Payer: Self-pay | Admitting: "Endocrinology

## 2023-05-08 ENCOUNTER — Other Ambulatory Visit: Payer: Self-pay | Admitting: Nurse Practitioner

## 2023-05-08 DIAGNOSIS — I480 Paroxysmal atrial fibrillation: Secondary | ICD-10-CM

## 2023-05-08 DIAGNOSIS — M5030 Other cervical disc degeneration, unspecified cervical region: Secondary | ICD-10-CM

## 2023-05-22 ENCOUNTER — Other Ambulatory Visit: Payer: Self-pay | Admitting: "Endocrinology

## 2023-05-22 DIAGNOSIS — E039 Hypothyroidism, unspecified: Secondary | ICD-10-CM

## 2023-06-08 ENCOUNTER — Ambulatory Visit (HOSPITAL_COMMUNITY)
Admission: RE | Admit: 2023-06-08 | Discharge: 2023-06-08 | Disposition: A | Discharge status: Home / Self Care | Payer: Medicare HMO | Source: Ambulatory Visit | Attending: Adult Health | Admitting: Adult Health

## 2023-06-08 DIAGNOSIS — I7121 Aneurysm of the ascending aorta, without rupture: Secondary | ICD-10-CM | POA: Insufficient documentation

## 2023-06-08 MED ORDER — IOHEXOL 350 MG/ML SOLN: 60.0000 mL | Freq: Once | INTRAVENOUS | Status: DC | PRN

## 2023-06-08 MED ORDER — IOHEXOL 350 MG/ML SOLN
60.0000 mL | Freq: Once | INTRAVENOUS | Status: AC | PRN
  Administered 2023-06-08: 60 mL via INTRAVENOUS

## 2023-06-10 ENCOUNTER — Telehealth: Payer: Self-pay

## 2023-06-10 NOTE — Telephone Encounter (Addendum)
Called patient regarding results. Patient had understanding of results.----- Message from Joni Reining sent at 06/10/2023  7:35 AM EST ----- Reviewed the CT scan.  The measurement of the aorta is 3.3 cm which shows no evidence of aneurysm. Thyroid nodules are noted that were there on previous CT scans. Moderate sized hiatal hernia is noted. Some plaque is noted in the aorta. No concerns here.

## 2023-06-23 ENCOUNTER — Telehealth: Payer: Self-pay

## 2023-06-23 ENCOUNTER — Ambulatory Visit: Payer: Medicare HMO

## 2023-06-23 VITALS — Ht 62.0 in | Wt 114.0 lb

## 2023-06-23 DIAGNOSIS — Z0000 Encounter for general adult medical examination without abnormal findings: Secondary | ICD-10-CM | POA: Diagnosis not present

## 2023-06-23 NOTE — Telephone Encounter (Signed)
 Copied from CRM [id number]. Topic: Appointments - Appointment Info/Confirmation >> Jun 23, 2023 11:00 AM [place] wrote: Per patient, home phone not working, she will answer the cell phone [phone]

## 2023-06-23 NOTE — Patient Instructions (Signed)
 Evelyn [name] , Thank you for taking time to come for your Medicare Wellness Visit. I appreciate your ongoing commitment to your health goals. Please review the following plan we discussed and let me know if I can assist you in the future.   Referrals/Orders/Follow-Ups/Clinician Recommendations: Aim for 30 minutes of exercise or brisk walking, 6-8 glasses of water, and 5 servings of fruits and vegetables each day.  This is a list of the screening recommended for you and due dates:  Health Maintenance  Topic Date Due   DEXA scan (bone density measurement)  01/06/2018   Pneumonia Vaccine (2 of 2 - PPSV23 or PCV20) 03/08/2018   COVID-19 Vaccine (3 - 2024-25 season) 01/16/2023   Zoster (Shingles) Vaccine (1 of 2) 07/02/2023*   DTaP/Tdap/Td vaccine (1 - Tdap) 03/31/2024*   Medicare Annual Wellness Visit  06/22/2024   Flu Shot  Completed   HPV Vaccine  Aged Out  *Topic was postponed. The date shown is not the original due date.    Advanced directives: (ACP Link)Information on Advanced Care Planning can be found at [place]  Secretary of [facility] Advance Health Care Directives Advance Health Care Directives ([email or web address]   Next Medicare Annual Wellness Visit scheduled for next year: Yes

## 2023-06-23 NOTE — Progress Notes (Signed)
 Subjective:   Evelyn [name] is a 83 y.o. female who presents for Medicare Annual (Subsequent) preventive examination.  Visit Complete: Virtual I connected with  Orlean VEAR Lunger on 06/23/23 by a video and audio enabled telemedicine application and verified that I am speaking with the correct person using two identifiers.  Patient Location: Home  Provider Location: Home Office  This patient declined Interactive audio and video telecommunications. Therefore the visit was completed with audio only.  I discussed the limitations of evaluation and management by telemedicine. The patient expressed understanding and agreed to proceed.  Vital Signs: Because this visit was a virtual/telehealth visit, some criteria may be missing or patient reported. Any vitals not documented were not able to be obtained and vitals that have been documented are patient reported.  Cardiac Risk Factors include: advanced age (>11men, >61 women);hypertension     Objective:    Today's Vitals   06/23/23 1644  Weight: 114 lb (51.7 kg)  Height: 5' 2 (1.575 m)   Body mass index is 20.85 kg/m.     06/23/2023    4:50 PM 02/18/2023    5:00 AM 09/09/2022    9:39 AM 08/22/2022    6:30 PM 06/21/2022   10:36 AM 11/20/2021    1:28 PM 10/14/2021    8:34 AM  Advanced Directives  Does Patient Have a Medical Advance Directive? No No No No Yes No No  Type of Agricultural Consultant;Living will    Copy of Healthcare Power of Attorney in Chart?     No - copy requested    Would patient like information on creating a medical advance directive? Yes (MAU/Ambulatory/Procedural Areas - Information given) No - Patient declined    No - Patient declined     Current Medications (verified) Outpatient Encounter Medications as of 06/23/2023  Medication Sig   acetaminophen  (TYLENOL ) 500 MG tablet Take 500-1,000 mg by mouth every 6 (six) hours as needed for moderate pain.   Cholecalciferol ([employer] D3) 125 MCG (5000  UT) capsule TAKE 1 CAPSULE BY MOUTH EVERY DAY   diltiazem  (CARDIZEM ) 30 MG tablet Take 1 tablet (30 mg total) by mouth every 6 (six) hours as needed (for rapid heart rate over 120 bpm).   doxycycline  (VIBRA -TABS) 100 MG tablet Take 1 tablet (100 mg total) by mouth 2 (two) times daily. 1 po bid   ELIQUIS  2.5 MG TABS tablet TAKE 1 TABLET BY MOUTH TWICE A DAY   HYDROcodone -acetaminophen  (NORCO/VICODIN) 5-325 MG tablet Take 1 tablet by mouth 2 (two) times daily as needed for moderate pain (pain score 4-6).   isosorbide  mononitrate (IMDUR ) 60 MG 24 hr tablet Take 1 tablet (60 mg total) by mouth daily.   levothyroxine  (SYNTHROID ) 75 MCG tablet TAKE 1 TABLET BY MOUTH DAILY BEFORE BREAKFAST.   metoprolol  tartrate (LOPRESSOR ) 25 MG tablet TAKE 1 TABLET BY MOUTH TWICE A DAY (Patient taking differently: Take 25 mg by mouth 2 (two) times daily. Can Take Additional 25 mg Tablet For Rapid Heart Rate.)   nitroGLYCERIN  (NITROSTAT ) 0.4 MG SL tablet PLACE 1 TABLET UNDER THE TONGUE EVERY 5 MINUTES AS NEEDED FOR CHEST PAIN.   potassium chloride  (KLOR-CON ) 10 MEQ tablet Take 1 tablet (10 mEq total) by mouth daily.   rosuvastatin  (CRESTOR ) 20 MG tablet TAKE 1 TABLET BY MOUTH EVERY DAY   Zoledronic  Acid (RECLAST  IV) Inject into the vein. Once yearly   No facility-administered encounter medications on file as of 06/23/2023.  Allergies (verified) Sulfa antibiotics   History: Past Medical History:  Diagnosis Date   Aneurysm (HCC)    Aortic insufficiency    Atrial fibrillation (HCC)    CAD (coronary artery disease)    CABG LIMA to LAD,vein graft to diagonal,vein graft to OM   Dyslipidemia    Essential hypertension 09/16/2013   GERD (gastroesophageal reflux disease)    Hypothyroid    Past Surgical History:  Procedure Laterality Date   ABDOMINAL HYSTERECTOMY     BACK SURGERY  2009   BACK SURGERY     CORONARY ANGIOPLASTY  2003   cutting balloon atherectomy in-stent stenosis of the LAD.   CORONARY  ANGIOPLASTY WITH STENT PLACEMENT  2002   remote PCI & stenting LAD,   CORONARY ARTERY BYPASS GRAFT  2004   LIMA to LAD,vein graft to diagonal,vein graft to OM   FOOT SURGERY     KYPHOPLASTY N/A 11/24/2021   Procedure: LUMBAR ONE KYPHOPLASTY;  Surgeon: Colon Shove, MD;  Location: [facility] OR;  Service: Neurosurgery;  Laterality: N/A;  LUMBAR ONE KYPHOPLASTY   LEFT HEART CATH AND CORS/GRAFTS ANGIOGRAPHY N/A 02/23/2017   Procedure: LEFT HEART CATH AND CORS/GRAFTS ANGIOGRAPHY;  Surgeon: Darron Deatrice LABOR, MD;  Location: [facility] INVASIVE CV LAB;  Service: Cardiovascular;  Laterality: N/A;   LEFT HEART CATHETERIZATION WITH CORONARY ANGIOGRAM N/A 12/15/2011   Procedure: LEFT HEART CATHETERIZATION WITH CORONARY ANGIOGRAM;  Surgeon: Jerel Balding, MD;  Location: [facility] CATH LAB;  Service: Cardiovascular;  Laterality: N/A;   LOOP RECORDER INSERTION N/A 06/14/2018   Procedure: LOOP RECORDER INSERTION;  Surgeon: Balding Jerel, MD;  Location: [facility] INVAS[facility] CV [facility];  Service: Cardiovascular;  Laterality: N/A;   NECK SURGERY     NM MYOCAR PERF WALL MOTION  12/01/2011   normal study-persistent exercise induced ECG changes raises the concern for ischemia (balanced ischemia).   NOSE SURGERY     PACEMAKER INSERTION     NOT PACEMAKER - LOOP RECORDER   TONSILLECTOMY  1948   TUBAL LIGATION     Family History  Problem Relation Age of Onset   Heart attack Mother    Heart failure Father    Hypertension Father    Heart disease Father    Asthma Maternal Grandmother    Heart disease Sister    Stroke Brother    Voice disorder Daughter        vocal dystonia   Stroke Daughter    Stroke Son    Cancer Sister 61   Social History   Socioeconomic History   Marital status: Widowed    Spouse name: Not on file   Number of children: 2   Years of education: Associates   Highest education level: Associate degree: academic program  Occupational History   Occupation: Retired  Tobacco Use   Smoking status: Never   Smokeless  tobacco: Never  Vaping Use   Vaping status: Never Used  Substance and Sexual Activity   Alcohol use: No   Drug use: No   Sexual activity: Not Currently  Other Topics Concern   Not on file  Social History Narrative   Mother of 2 with 4 grandchildren   Right-handed   Caffeine: 8-10 cups coffee   Pacer    Are you right handed or left handed? right   Are you currently employed ?    What is your current occupation? retired   Do you live at home alone? With son   Who lives with you?    What type of home  do you live in: 1 story or 2 story? one       Social Drivers of Corporate Investment Banker Strain: Low Risk  (06/23/2023)   Overall Financial Resource Strain (CARDIA)    Difficulty of Paying Living Expenses: Not hard at all  Food Insecurity: No Food Insecurity (06/23/2023)   Hunger Vital Sign    Worried About Running Out of Food in the Last Year: Never true    Ran Out of Food in the Last Year: Never true  Transportation Needs: No Transportation Needs (06/23/2023)   PRAPARE - Administrator, Civil Service (Medical): No    Lack of Transportation (Non-Medical): No  Physical Activity: Insufficiently Active (06/23/2023)   Exercise Vital Sign    Days of Exercise per Week: 3 days    Minutes of Exercise per Session: 30 min  Stress: No Stress Concern Present (06/23/2023)   [employer] of Occupational Health - Occupational Stress Questionnaire    Feeling of Stress : Not at all  Social Connections: Moderately Integrated (06/23/2023)   Social Connection and Isolation Panel [NHANES]    Frequency of Communication with Friends and Family: More than three times a week    Frequency of Social Gatherings with Friends and Family: Three times a week    Attends Religious Services: 1 to 4 times per year    Active Member of Clubs or Organizations: Yes    Attends Banker Meetings: 1 to 4 times per year    Marital Status: Widowed    Tobacco Counseling Counseling given: Not  Answered   Clinical Intake:  Pre-visit preparation completed: Yes  Pain : No/denies pain     Diabetes: No  How often do you need to have someone help you when you read instructions, pamphlets, or other written materials from your doctor or pharmacy?: 1 - Never  Interpreter Needed?: No  Information entered by :: Charmaine Bloodgood LPN   Activities of Daily Living    06/23/2023    4:49 PM 02/18/2023    5:00 AM  In your present state of health, do you have any difficulty performing the following activities:  Hearing? 0 1  Vision? 0 0  Difficulty concentrating or making decisions? 0 0  Walking or climbing stairs? 0   Dressing or bathing? 0   Doing errands, shopping? 0 0  Preparing Food and eating ? N   Using the Toilet? N   In the past six months, have you accidently leaked urine? N   Do you have problems with loss of bowel control? N   Managing your Medications? N   Managing your Finances? N   Housekeeping or managing your Housekeeping? N     Patient Care Team: Gladis Mustard, FNP as PCP - General (Family Medicine) Croitoru, Jerel, MD as PCP - Cardiology (Cardiology) Inocencio Soyla Gladis, MD as PCP - Electrophysiology (Cardiology) Lenis Ethelle ORN, MD as Consulting Physician (Endocrinology) Ivin Kocher, MD as Referring Physician (Dermatology) Dermatology, [facility] any recent Medical Services you may have received from other than [facility] providers in the past year (date may be approximate).     Assessment:   This is a routine wellness examination for [place].  Hearing/Vision screen Hearing Screening - Comments:: Denies hearing difficulties   Vision Screening - Comments:: No vision problems; will schedule routine eye exam soon     Goals Addressed   None   Depression Screen    06/23/2023    4:48 PM 04/01/2023  9:18 AM 03/25/2023   11:14 AM 02/28/2023   11:01 AM 02/22/2023   12:16 PM 11/26/2022    8:43 AM 11/22/2022    2:13 PM  PHQ 2/9  Scores  PHQ - 2 Score 0 0 0 0 0 0 0  PHQ- 9 Score  0 1  0 0 0    Fall Risk    06/23/2023    4:49 PM 04/01/2023    9:17 AM 03/25/2023   11:13 AM 02/28/2023   11:01 AM 02/22/2023   12:16 PM  Fall Risk   Falls in the past year? 1 0 1 1 1   Number falls in past yr: 0  0 0 0  Injury with Fall? 1  1 1 1   Risk for fall due to : History of fall(s)  History of fall(s) History of fall(s) History of fall(s)  Follow up Falls prevention discussed;Education provided;Falls evaluation completed  Education provided Falls evaluation completed Education provided    MEDICARE RISK AT HOME: Medicare Risk at Home Any stairs in or around the home?: No If so, are there any without handrails?: No Home free of loose throw rugs in walkways, pet beds, electrical cords, etc?: Yes Adequate lighting in your home to reduce risk of falls?: Yes Life alert?: No Use of a cane, walker or w/c?: No Grab bars in the bathroom?: Yes Shower chair or bench in shower?: No Elevated toilet seat or a handicapped toilet?: Yes  TIMED UP AND GO:  Was the test performed?  No    Cognitive Function:    01/11/2018   10:55 AM  MMSE - Mini Mental State Exam  Orientation to time 5  Orientation to Place 5  Registration 3  Attention/ Calculation 5  Recall 2  Language- name 2 objects 2  Language- repeat 1  Language- follow 3 step command 3  Language- read & follow direction 1  Write a sentence 1  Copy design 1  Total score 29        06/23/2023    4:49 PM 06/21/2022   10:36 AM 02/26/2020   10:39 AM 01/18/2019    9:56 AM  6CIT Screen  What Year? 0 points 0 points 0 points 0 points  What month? 0 points 0 points 0 points 0 points  What time? 0 points 0 points 0 points 0 points  Count back from 20 0 points 0 points 0 points 0 points  Months in reverse 0 points 0 points 0 points 0 points  Repeat phrase 2 points 0 points 0 points   Total Score 2 points 0 points 0 points     Immunizations Immunization History  Administered  Date(s) Administered   Fluad Quad(high Dose 65+) 02/29/2020, 03/27/2021, 05/11/2022   Fluad Trivalent(High Dose 65+) 02/22/2023   Influenza, High Dose Seasonal PF 03/09/2016, 03/02/2017   Moderna Sars-Covid-2 Vaccination 01/02/2020, 01/30/2020   Pneumococcal Conjugate-13 01/11/2018    TDAP status: Due, Education has been provided regarding the importance of this vaccine. Advised may receive this vaccine at local pharmacy or Health Dept. Aware to provide a copy of the vaccination record if obtained from local pharmacy or Health Dept. Verbalized acceptance and understanding.  Flu Vaccine status: Up to date  Pneumococcal vaccine status: Declined,  Education has been provided regarding the importance of this vaccine but patient still declined. Advised may receive this vaccine at local pharmacy or Health Dept. Aware to provide a copy of the vaccination record if obtained from local pharmacy or Health Dept. Verbalized acceptance  and understanding.   Covid-19 vaccine status: Declined, Education has been provided regarding the importance of this vaccine but patient still declined. Advised may receive this vaccine at local pharmacy or Health Dept.or vaccine clinic. Aware to provide a copy of the vaccination record if obtained from local pharmacy or Health Dept. Verbalized acceptance and understanding.  Qualifies for Shingles Vaccine? Yes   Zostavax completed No   Shingrix Completed?: No.    Education has been provided regarding the importance of this vaccine. Patient has been advised to call insurance company to determine out of pocket expense if they have not yet received this vaccine. Advised may also receive vaccine at local pharmacy or Health Dept. Verbalized acceptance and understanding.  Screening Tests Health Maintenance  Topic Date Due   DEXA SCAN  01/06/2018   Pneumonia Vaccine 13+ Years old (2 of 2 - PPSV23 or PCV20) 03/08/2018   COVID-19 Vaccine (3 - 2024-25 season) 01/16/2023   Zoster  Vaccines- Shingrix (1 of 2) 07/02/2023 (Originally 07/25/1990)   DTaP/Tdap/Td (1 - Tdap) 03/31/2024 (Originally 07/25/1959)   Medicare Annual Wellness (AWV)  06/22/2024   INFLUENZA VACCINE  Completed   HPV VACCINES  Aged Out    Health Maintenance  Health Maintenance Due  Topic Date Due   DEXA SCAN  01/06/2018   Pneumonia Vaccine 58+ Years old (2 of 2 - PPSV23 or PCV20) 03/08/2018   COVID-19 Vaccine (3 - 2024-25 season) 01/16/2023    Colorectal cancer screening: No longer required.   Mammogram status: No longer required due to age and preference .  Bone Density status: Ordered today. Pt provided with contact info and advised to call to schedule appt.  Lung Cancer Screening: (Low Dose CT Chest recommended if Age 21-80 years, 20 pack-year currently smoking OR have quit w/in 15years.) does not qualify.   Lung Cancer Screening Referral: n/a  Additional Screening:  Hepatitis C Screening: does not qualify   Vision Screening: Recommended annual ophthalmology exams for early detection of glaucoma and other disorders of the eye. Is the patient up to date with their annual eye exam?  No  Who is the provider or what is the name of the office in which the patient attends annual eye exams? none If pt is not established with a provider, would they like to be referred to a provider to establish care? No .   Dental Screening: Recommended annual dental exams for proper oral hygiene   Community Resource Referral / Chronic Care Management: CRR required this visit?  No   CCM required this visit?  No     Plan:     I have personally reviewed and noted the following in the patient's chart:   Medical and social history Use of alcohol, tobacco or illicit drugs  Current medications and supplements including opioid prescriptions. Patient is not currently taking opioid prescriptions. Functional ability and status Nutritional status Physical activity Advanced directives List of other  physicians Hospitalizations, surgeries, and ER visits in previous 12 months Vitals Screenings to include cognitive, depression, and falls Referrals and appointments  In addition, I have reviewed and discussed with patient certain preventive protocols, quality metrics, and best practice recommendations. A written personalized care plan for preventive services as well as general preventive health recommendations were provided to patient.     Lavelle Pfeiffer [place], [place]   11/16/7972   After Visit Summary: (MyChart) Due to this being a telephonic visit, the after visit summary with patients personalized plan was offered to patient via MyChart  Nurse Notes: No concerns at this time

## 2023-06-30 ENCOUNTER — Encounter: Payer: Self-pay | Admitting: Nurse Practitioner

## 2023-06-30 ENCOUNTER — Ambulatory Visit: Payer: Medicare HMO | Admitting: Nurse Practitioner

## 2023-06-30 VITALS — BP 125/77 | HR 54 | Temp 97.8°F | Ht 62.0 in | Wt 116.0 lb

## 2023-06-30 DIAGNOSIS — R52 Pain, unspecified: Secondary | ICD-10-CM

## 2023-06-30 DIAGNOSIS — G451 Carotid artery syndrome (hemispheric): Secondary | ICD-10-CM

## 2023-06-30 DIAGNOSIS — I10 Essential (primary) hypertension: Secondary | ICD-10-CM

## 2023-06-30 DIAGNOSIS — F419 Anxiety disorder, unspecified: Secondary | ICD-10-CM | POA: Diagnosis not present

## 2023-06-30 DIAGNOSIS — I7121 Aneurysm of the ascending aorta, without rupture: Secondary | ICD-10-CM

## 2023-06-30 DIAGNOSIS — E876 Hypokalemia: Secondary | ICD-10-CM

## 2023-06-30 DIAGNOSIS — I480 Paroxysmal atrial fibrillation: Secondary | ICD-10-CM

## 2023-06-30 DIAGNOSIS — M5030 Other cervical disc degeneration, unspecified cervical region: Secondary | ICD-10-CM

## 2023-06-30 DIAGNOSIS — E039 Hypothyroidism, unspecified: Secondary | ICD-10-CM

## 2023-06-30 DIAGNOSIS — I25718 Atherosclerosis of autologous vein coronary artery bypass graft(s) with other forms of angina pectoris: Secondary | ICD-10-CM

## 2023-06-30 DIAGNOSIS — E782 Mixed hyperlipidemia: Secondary | ICD-10-CM

## 2023-06-30 LAB — LIPID PANEL

## 2023-06-30 MED ORDER — ROSUVASTATIN CALCIUM 20 MG PO TABS
20.0000 mg | ORAL_TABLET | Freq: Every day | ORAL | 1 refills | Status: DC
Start: 2023-06-30 — End: 2023-12-22

## 2023-06-30 MED ORDER — HYDROCODONE-ACETAMINOPHEN 5-325 MG PO TABS: 1.0000 | ORAL_TABLET | Freq: Two times a day (BID) | ORAL | 0 refills | Status: DC | PRN

## 2023-06-30 MED ORDER — DILTIAZEM HCL 30 MG PO TABS
30.0000 mg | ORAL_TABLET | Freq: Four times a day (QID) | ORAL | 1 refills | Status: DC | PRN
Start: 2023-06-30 — End: 2023-07-25

## 2023-06-30 MED ORDER — POTASSIUM CHLORIDE ER 10 MEQ PO TBCR
10.0000 meq | EXTENDED_RELEASE_TABLET | Freq: Every day | ORAL | 1 refills | Status: DC
Start: 2023-06-30 — End: 2023-12-22

## 2023-06-30 MED ORDER — HYDROCODONE-ACETAMINOPHEN 5-325 MG PO TABS: 1.0000 | ORAL_TABLET | Freq: Two times a day (BID) | ORAL | 0 refills | Status: AC | PRN

## 2023-06-30 NOTE — Patient Instructions (Signed)

## 2023-06-30 NOTE — Progress Notes (Signed)
Subjective:    Patient ID: Evelyn [name], female    DOB: 09-27-1940, 83 y.o.   MRN: [id number]   Chief Complaint: medical management of chronic issues     HPI:  Evelyn [name] is a 83 y.o. who identifies as a female who was assigned female at birth.   Social history: Lives with: by herself- her son lives next door and she talks to her daughter daily. Work history: retired   Water engineer in today for follow up of the following chronic medical issues:  1. Essential hypertension No c/o chest pain, sob or headache. Does not check blood pressure at home. BP Readings from Last 3 Encounters:  06/30/23 125/77  04/07/23 122/64  04/04/23 130/70     2. Paroxysmal atrial fibrillation Is on eliquis with no bleeding issues. Denies palpitations or heart racing.  3. 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 Scheduled to see cardiology in november 5. Hemispheric carotid artery syndrome Last saw cardiology on 02/18/23. Review of office note showed no change in plan of care, with no significant stenosis  6. Acquired hypothyroidism No issues that hseis aware of. Lab Results  Component Value Date   TSH 0.791 12/21/2022     7. Mixed hyperlipidemia Does try to watch diet and stays fairly active Lab Results  Component Value Date   CHOL 116 11/23/2022   HDL 58 11/23/2022   LDLCALC 44 11/23/2022   TRIG 68 11/23/2022   CHOLHDL 2.0 11/23/2022     8. Anxiety Is currently on  no anxiey medication.     04/01/2023    9:18 AM 03/25/2023   11:14 AM 02/22/2023   12:17 PM 11/26/2022    8:43 AM  GAD 7 : Generalized Anxiety Score  Nervous, Anxious, on Edge 0 0 0 0  Control/stop worrying 0 0 0 0  Worry too much - different things 0 0 0 0  Trouble relaxing 0 0 0 0  Restless 0 0 0 0  Easily annoyed or irritable 0 0 0 0  Afraid - awful might happen 0 0 0 0  Total GAD 7 Score 0 0 0 0  Anxiety  Difficulty Not difficult at all Not difficult at all Not difficult at all Not difficult at all      9. Hypokalemia No c/o muscle cramps Lab Results  Component Value Date   K 4.6 04/07/2023   10. DDD cervical spine 11. Pain management Patient still has chronic neck and back pain. Rating of pain varies from day to day. She use to be on pain medication, but that was stopped when drug screen failed to show medication in her system. She was in so much pain last time that I agreed to start her back on it. Pain assessment: Cause of pain- DDD Pain location- neck and lower back Pain on scale of 1-10- 5-6/10 Frequency- daily What increases pain-nothing really What makes pain Better-rest helps Effects on ADL - none Any change in general medical condition-none  Current opioids rx- norco 5/325 BID # meds rx- 60 Effectiveness of current meds-helps Adverse reactions from pain meds-none Morphine equivalent- 15 MME  Pill count performed-No Last drug screen - 09/29/21 ( high risk q74m, moderate risk q49m, low risk yearly ) Urine drug screen today- Yes Was the [facility] reviewed- yes  If yes were their any concerning findings? - no   Overdose risk: 1   Pain contract signed on:11/23/22  New complaints: None today  Allergies  Allergen Reactions   Sulfa Antibiotics Swelling   Outpatient Encounter Medications as of 06/30/2023  Medication Sig   acetaminophen (TYLENOL) 500 MG tablet Take 500-1,000 mg by mouth every 6 (six) hours as needed for moderate pain.   Cholecalciferol ([employer] D3) 125 MCG (5000 UT) capsule TAKE 1 CAPSULE BY MOUTH EVERY DAY   diltiazem (CARDIZEM) 30 MG tablet Take 1 tablet (30 mg total) by mouth every 6 (six) hours as needed (for rapid heart rate over 120 bpm).   ELIQUIS 2.5 MG TABS tablet TAKE 1 TABLET BY MOUTH TWICE A DAY   HYDROcodone-acetaminophen (NORCO/VICODIN) 5-325 MG tablet Take 1 tablet by mouth 2 (two) times daily as needed for moderate pain (pain score 4-6).    isosorbide mononitrate (IMDUR) 60 MG 24 hr tablet Take 1 tablet (60 mg total) by mouth daily.   levothyroxine (SYNTHROID) 75 MCG tablet TAKE 1 TABLET BY MOUTH DAILY BEFORE BREAKFAST.   metoprolol tartrate (LOPRESSOR) 25 MG tablet TAKE 1 TABLET BY MOUTH TWICE A DAY (Patient taking differently: Take 25 mg by mouth 2 (two) times daily. Can Take Additional 25 mg Tablet For Rapid Heart Rate.)   nitroGLYCERIN (NITROSTAT) 0.4 MG SL tablet PLACE 1 TABLET UNDER THE TONGUE EVERY 5 MINUTES AS NEEDED FOR CHEST PAIN.   potassium chloride (KLOR-CON) 10 MEQ tablet Take 1 tablet (10 mEq total) by mouth daily.   rosuvastatin (CRESTOR) 20 MG tablet TAKE 1 TABLET BY MOUTH EVERY DAY   Zoledronic Acid (RECLAST IV) Inject into the vein. Once yearly   [DISCONTINUED] doxycycline (VIBRA-TABS) 100 MG tablet Take 1 tablet (100 mg total) by mouth 2 (two) times daily. 1 po bid   No facility-administered encounter medications on file as of 06/30/2023.    Past Surgical History:  Procedure Laterality Date   ABDOMINAL HYSTERECTOMY     BACK SURGERY  2009   BACK SURGERY     CORONARY ANGIOPLASTY  2003   cutting balloon atherectomy in-stent stenosis of the LAD.   CORONARY ANGIOPLASTY WITH STENT PLACEMENT  2002   remote PCI & stenting LAD,   CORONARY ARTERY BYPASS GRAFT  2004   LIMA to LAD,vein graft to diagonal,vein graft to OM   FOOT SURGERY     KYPHOPLASTY N/A 11/24/2021   Procedure: LUMBAR ONE KYPHOPLASTY;  Surgeon: Barnett Abu, MD;  Location: [facility] OR;  Service: Neurosurgery;  Laterality: N/A;  LUMBAR ONE KYPHOPLASTY   LEFT HEART CATH AND CORS/GRAFTS ANGIOGRAPHY N/A 02/23/2017   Procedure: LEFT HEART CATH AND CORS/GRAFTS ANGIOGRAPHY;  Surgeon: Iran Ouch, MD;  Location: [facility] INVASIVE CV LAB;  Service: Cardiovascular;  Laterality: N/A;   LEFT HEART CATHETERIZATION WITH CORONARY ANGIOGRAM N/A 12/15/2011   Procedure: LEFT HEART CATHETERIZATION WITH CORONARY ANGIOGRAM;  Surgeon: Thurmon Fair, MD;  Location: [facility] CATH  LAB;  Service: Cardiovascular;  Laterality: N/A;   LOOP RECORDER INSERTION N/A 06/14/2018   Procedure: LOOP RECORDER INSERTION;  Surgeon: Thurmon Fair, MD;  Location: [facility] INVASIVE CV LAB;  Service: Cardiovascular;  Laterality: N/A;   NECK SURGERY     NM MYOCAR PERF WALL MOTION  12/01/2011   normal study-persistent exercise induced ECG changes raises the concern for ischemia (balanced ischemia).   NOSE SURGERY     PACEMAKER INSERTION     NOT PACEMAKER - LOOP RECORDER   TONSILLECTOMY  1948   TUBAL LIGATION      Family History  Problem Relation Age of Onset   Heart attack Mother    Heart  failure Father    Hypertension Father    Heart disease Father    Asthma Maternal Grandmother    Heart disease Sister    Stroke Brother    Voice disorder Daughter        vocal dystonia   Stroke Daughter    Stroke Son    Cancer Sister 15      Review of Systems  Constitutional:  Negative for diaphoresis.  Eyes:  Negative for pain.  Respiratory:  Negative for shortness of breath.   Cardiovascular:  Negative for chest pain, palpitations and leg swelling.  Gastrointestinal:  Negative for abdominal pain.  Endocrine: Negative for polydipsia.  Skin:  Negative for rash.  Neurological:  Negative for dizziness, weakness and headaches.  Hematological:  Does not bruise/bleed easily.  All other systems reviewed and are negative.      Objective:   Physical Exam Vitals and nursing note reviewed.  Constitutional:      General: She is not in acute distress.    Appearance: Normal appearance. She is well-developed.  HENT:     Head: Normocephalic.     Right Ear: Tympanic membrane normal.     Left Ear: Tympanic membrane normal.     Nose: Nose normal.     Mouth/Throat:     Mouth: Mucous membranes are moist.  Eyes:     Pupils: Pupils are equal, round, and reactive to light.  Neck:     Vascular: No carotid bruit or JVD.  Cardiovascular:     Rate and Rhythm: Normal rate and regular rhythm.      Heart sounds: Normal heart sounds.  Pulmonary:     Effort: Pulmonary effort is normal. No respiratory distress.     Breath sounds: Normal breath sounds. No wheezing or rales.  Chest:     Chest wall: No tenderness.  Abdominal:     General: Bowel sounds are normal. There is no distension or abdominal bruit.     Palpations: Abdomen is soft. There is no hepatomegaly, splenomegaly, mass or pulsatile mass.     Tenderness: There is no abdominal tenderness.  Musculoskeletal:        General: Normal range of motion.     Cervical back: Normal range of motion and neck supple.  Lymphadenopathy:     Cervical: No cervical adenopathy.  Skin:    General: Skin is warm and dry.  Neurological:     General: No focal deficit present.     Mental Status: She is alert and oriented to person, place, and time.     Cranial Nerves: No cranial nerve deficit.     Sensory: No sensory deficit.     Deep Tendon Reflexes: Reflexes are normal and symmetric.  Psychiatric:        Behavior: Behavior normal.        Thought Content: Thought content normal.        Judgment: Judgment normal.    BP 125/77   Pulse (!) 54   Temp 97.8 F (36.6 C) (Temporal)   Ht 5\' 2"  (1.575 m)   Wt 116 lb (52.6 kg)   SpO2 98%   BMI 21.22 kg/m         Assessment & Plan:   Evelyn [name] comes in today with chief complaint of Medical Management of Chronic Issues   Diagnosis and orders addressed:  1. Essential hypertension Low sodium diet - furosemide (LASIX) 20 MG tablet; Take 1 tablet (20 mg total) by mouth daily.  Dispense: 90 tablet;  Refill: 1  2. Paroxysmal atrial fibrillation Avoid caffeine - apixaban (ELIQUIS) 2.5 MG TABS tablet; Take 1 tablet (2.5 mg total) by mouth 2 (two) times daily.  Dispense: 180 tablet; Refill: 1 - diltiazem (CARDIZEM) 30 MG tablet; Take 1 tablet (30 mg total) by mouth every 6 (six) hours as needed (for rapid heart rate).  Dispense: 30 tablet; Refill: 3 - metoprolol tartrate (LOPRESSOR) 100  MG tablet; Take 1 tablet (100 mg total) by mouth 2 (two) times daily.  Dispense: 180 tablet; Refill: 1  3. 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  Dispense: 90 tablet; Refill: 1 5. Hemispheric carotid artery syndrome Keep yearly follow up with cardiology  6. Acquired hypothyroidism Labs pending  7. Mixed hyperlipidemia Low fat diet - Lipid panel - rosuvastatin (CRESTOR) 20 MG tablet; Take 1 tablet (20 mg total) by mouth daily.  Dispense: 90 tablet; Refill: 1  8. Anxiety stess management  9. Hypokalemia Labs pending - potassium chloride (KLOR-CON) 10 MEQ tablet; Take 1 tablet (10 mEq total) by mouth daily.  Dispense: 90 tablet; Refill: 3  10. DDD (degenerative disc disease), cervical Moist heat 'rest - Drug Screen 10 W/Conf, Se - HYDROcodone-acetaminophen (NORCO) 5-325 MG tablet; Take 1 tablet by mouth every 6 (six) hours as needed for moderate pain.  Dispense: 60 tablet; Refill: 0 - HYDROcodone-acetaminophen (NORCO) 5-325 MG tablet; Take 1 tablet by mouth every 6 (six) hours as needed for moderate pain.  Dispense: 60 tablet; Refill: 0 - HYDROcodone-acetaminophen (NORCO) 5-325 MG tablet; Take 1 tablet by mouth every 6 (six) hours as needed for moderate pain.  Dispense: 60 tablet; Refill: 0  11. Pain management  12. Hospital follow up Hospital records reviewed  Labs pending Health Maintenance reviewed Diet and exercise encouraged  Follow up plan: 3 months   [name], FNP

## 2023-07-01 LAB — CMP14+EGFR
ALT: 11 IU/L (ref 0–32)
AST: 16 IU/L (ref 0–40)
Albumin: 4.5 g/dL (ref 3.7–4.7)
Alkaline Phosphatase: 61 IU/L (ref 44–121)
BUN/Creatinine Ratio: 17 (ref 12–28)
BUN: 14 mg/dL (ref 8–27)
Bilirubin Total: 0.4 mg/dL (ref 0.0–1.2)
CO2: 22 mmol/L (ref 20–29)
Calcium: 9.7 mg/dL (ref 8.7–10.3)
Chloride: 101 mmol/L (ref 96–106)
Creatinine, Ser: 0.81 mg/dL (ref 0.57–1.00)
Globulin, Total: 2.7 g/dL (ref 1.5–4.5)
Glucose: 82 mg/dL (ref 70–99)
Potassium: 4.1 mmol/L (ref 3.5–5.2)
Sodium: 140 mmol/L (ref 134–144)
Total Protein: 7.2 g/dL (ref 6.0–8.5)
eGFR: 72 mL/min/{1.73_m2} (ref >59)

## 2023-07-01 LAB — CBC WITH DIFFERENTIAL/PLATELET
Basophils Absolute: 0 10*3/uL (ref 0.0–0.2)
Basos: 0 %
EOS (ABSOLUTE): 0.1 10*3/uL (ref 0.0–0.4)
Eos: 1 %
Hematocrit: 43.2 % (ref 34.0–46.6)
Hemoglobin: 14.3 g/dL (ref 11.1–15.9)
Immature Grans (Abs): 0 10*3/uL (ref 0.0–0.1)
Immature Granulocytes: 0 %
Lymphocytes Absolute: 4 10*3/uL — ABNORMAL HIGH (ref 0.7–3.1)
Lymphs: 42 %
MCH: 31.4 pg (ref 26.6–33.0)
MCHC: 33.1 g/dL (ref 31.5–35.7)
MCV: 95 fL (ref 79–97)
Monocytes Absolute: 0.8 10*3/uL (ref 0.1–0.9)
Monocytes: 9 %
Neutrophils Absolute: 4.6 10*3/uL (ref 1.4–7.0)
Neutrophils: 48 %
Platelets: 246 10*3/uL (ref 150–450)
RBC: 4.56 x10E6/uL (ref 3.77–5.28)
RDW: 13.1 % (ref 11.7–15.4)
WBC: 9.6 10*3/uL (ref 3.4–10.8)

## 2023-07-01 LAB — THYROID PANEL WITH TSH
Free Thyroxine Index: 2.9 (ref 1.2–4.9)
T3 Uptake Ratio: 28 % (ref 24–39)
T4, Total: 10.2 ug/dL (ref 4.5–12.0)
TSH: 1.21 u[IU]/mL (ref 0.450–4.500)

## 2023-07-01 LAB — LIPID PANEL
Chol/HDL Ratio: 2 ratio (ref 0.0–4.4)
Cholesterol, Total: 138 mg/dL (ref 100–199)
HDL: 70 mg/dL
LDL Chol Calc (NIH): 53 mg/dL (ref 0–99)
Triglycerides: 74 mg/dL (ref 0–149)
VLDL Cholesterol Cal: 15 mg/dL (ref 5–40)

## 2023-07-19 ENCOUNTER — Ambulatory Visit (INDEPENDENT_AMBULATORY_CARE_PROVIDER_SITE_OTHER): Payer: Medicare HMO | Admitting: Nurse Practitioner

## 2023-07-19 ENCOUNTER — Encounter: Payer: Self-pay | Admitting: Nurse Practitioner

## 2023-07-19 VITALS — BP 102/64 | HR 57 | Temp 97.9°F | Ht 62.0 in | Wt 115.0 lb

## 2023-07-19 DIAGNOSIS — R1314 Dysphagia, pharyngoesophageal phase: Secondary | ICD-10-CM

## 2023-07-19 NOTE — Patient Instructions (Signed)
 Dysphagia  Dysphagia is trouble swallowing. This condition occurs when solids and liquids stick in a person's throat on the way down to the stomach, or when food takes longer to get to the stomach than usual. You may have problems swallowing food, liquids, or both. You may also have pain while trying to swallow. It may take you more time and effort to swallow something. What are the causes? This condition may be caused by: Muscle problems. These may make it difficult for you to move food and liquids through the esophagus, which is the tube that connects your mouth to your stomach. Blockages. You may have ulcers, scar tissue, or inflammation that blocks the normal passage of food and liquids. Causes of these problems include: Acid reflux from your stomach into your esophagus (gastroesophageal reflux). Infections. Radiation treatment for cancer. Medicines taken without enough fluids to wash them down into your stomach. Stroke. This can affect the nerves and make it difficult to swallow. Nerve problems. These prevent signals from being sent to the muscles of your esophagus to squeeze (contract) and move what you swallow down to your stomach. Globus pharyngeus. This is a common problem that involves a feeling like something is stuck in your throat or a sense of trouble with swallowing, even though nothing is wrong with the swallowing passages. Certain conditions, such as cerebral palsy or Parkinson's disease. What are the signs or symptoms? Common symptoms of this condition include: A feeling that solids or liquids are stuck in your throat on the way down to the stomach. Pain while swallowing. Coughing or gagging while trying to swallow. Other symptoms include: Food moving back from your stomach to your mouth (regurgitation). Noises coming from your throat. Chest discomfort when swallowing. A feeling of fullness when swallowing. Drooling, especially when the throat is blocked. Heartburn. How  is this diagnosed? This condition may be diagnosed by: Barium swallow X-ray. In this test, you will swallow a white liquid that sticks to the inside of your esophagus. X-ray images are then taken. Endoscopy. In this test, a flexible telescope is inserted down your throat to look at your esophagus and your stomach. CT scans or an MRI. How is this treated? Treatment for dysphagia depends on the cause of this condition: If the dysphagia is caused by acid reflux or infection, medicines may be used. These may include antibiotics or heartburn medicines. If the dysphagia is caused by problems with the muscles, swallowing therapy may be used to help you strengthen your swallowing muscles. You may have to do specific exercises to strengthen the muscles or stretch them. If the dysphagia is caused by a blockage or mass, procedures to remove the blockage may be done. You may need surgery and a feeding tube. You may need to make diet changes. Ask your health care provider for specific instructions. Follow these instructions at home: Medicines Take over-the-counter and prescription medicines only as told by your health care provider. If you were prescribed an antibiotic medicine, take it as told by your health care provider. Do not stop taking the antibiotic even if you start to feel better. Eating and drinking  Make any diet changes as told by your health care provider. Work with a diet and nutrition specialist (dietitian) to create an eating plan that will help you get the nutrients you need in order to stay healthy. Eat soft foods that are easier to swallow. Cut your food into small pieces and eat slowly. Take small bites. Eat and drink only when you  are sitting upright. Do not drink alcohol or caffeine. If you need help quitting, ask your health care provider. General instructions Check your weight every day to make sure you are not losing weight. Do not use any products that contain nicotine or  tobacco. These products include cigarettes, chewing tobacco, and vaping devices, such as e-cigarettes. If you need help quitting, ask your health care provider. Keep all follow-up visits. This is important. Contact a health care provider if: You lose weight because you cannot swallow. You cough when you drink liquids. You cough up partially digested food. Get help right away if: You cannot swallow your saliva. You have shortness of breath, a fever, or both. Your voice is hoarse and you have trouble swallowing. These symptoms may represent a serious problem that is an emergency. Do not wait to see if the symptoms will go away. Get medical help right away. Call your local emergency services (911 in the [place] Do not drive yourself to the hospital. Summary Dysphagia is trouble swallowing. This condition occurs when solids and liquids stick in a person's throat on the way down to the stomach. You may cough or gag while trying to swallow. Dysphagia has many possible causes. Treatment for dysphagia depends on the cause of the condition. Keep all follow-up visits. This is important. This information is not intended to replace advice given to you by your health care provider. Make sure you discuss any questions you have with your health care provider. Document Revised: 12/21/2019 Document Reviewed: 12/22/2019 Elsevier Patient Education  2024 [employer].

## 2023-07-19 NOTE — Progress Notes (Signed)
 Subjective:    Patient ID: Evelyn [name], female    DOB: December 25, 1940, 83 y.o.   MRN: [id number]   Chief Complaint: Wants referral to ENT   HPI  Patient in c/o sore  throat with constant drainage. Slight problems swallowing. Wants  to see eNT. Pills are getting hung in her throat. Family history of esophageal cancer Patient Active Problem List   Diagnosis Date Noted   Fall at home, initial encounter 02/18/2023   Fracture of nasal bone 02/18/2023   Fracture of right orbital floor (HCC) 02/18/2023   Fracture of maxillary sinus (HCC) 02/18/2023   Atrial fibrillation, chronic (HCC) 02/18/2023   Syncope 02/17/2023   Hypotension 11/25/2022   Intractable back pain 11/20/2021   Acquired thrombophilia (HCC) 11/20/2021   Hypokalemia 11/20/2021   Compression fracture of L1 vertebra (HCC) 11/20/2021   Paroxysmal atrial fibrillation (HCC) 12/18/2019   Cervical radiculopathy at C5 08/07/2019   Long term (current) use of anticoagulants 11/25/2018   DDD (degenerative disc disease), cervical 10/17/2018   Mixed hyperlipidemia 10/17/2018   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 CABG X 3 2004 12/15/2011   Hypothyroidism 12/15/2011       Review of Systems  Constitutional:  Negative for diaphoresis.  Eyes:  Negative for pain.  Respiratory:  Negative for shortness of breath.   Cardiovascular:  Negative  for chest pain, palpitations and leg swelling.  Gastrointestinal:  Negative for abdominal pain.  Endocrine: Negative for polydipsia.  Skin:  Negative for rash.  Neurological:  Negative for dizziness, weakness and headaches.  Hematological:  Does not bruise/bleed easily.  All other systems reviewed and are negative.      Objective:   Physical Exam Constitutional:      Appearance: Normal appearance.  Cardiovascular:     Rate and Rhythm: Normal rate and regular rhythm.     Heart sounds: Normal heart sounds.  Pulmonary:     Effort: Pulmonary effort is normal.     Breath sounds: Normal breath sounds.  Skin:    General: Skin is warm.  Neurological:     General: No focal deficit present.     Mental Status: She is alert and oriented to person, place, and time.  Psychiatric:        Mood and Affect: Mood normal.        Behavior: Behavior normal.     BP 102/64   Pulse (!) 57   Temp 97.9 F (36.6 C) (Temporal)   Ht 5\' 2"  (1.575 m)   Wt 115 lb (52.2 kg)   SpO2 96%   BMI 21.03 kg/m        Assessment & Plan:   Evelyn [name] in today with chief complaint of Wants referral to ENT   1. Pharyngoesophageal dysphagia (Primary) Chew food well.  Force fluids - Ambulatory referral to Gastroenterology    The above assessment and management plan was discussed with the patient. The patient verbalized understanding of and  has agreed to the management plan. Patient is aware to call the clinic if symptoms persist or worsen. Patient is aware when to return to the clinic for a follow-up visit. Patient educated on when it is appropriate to go to the emergency department.   [name], FNP

## 2023-07-24 ENCOUNTER — Other Ambulatory Visit: Payer: Self-pay | Admitting: Nurse Practitioner

## 2023-07-24 DIAGNOSIS — I480 Paroxysmal atrial fibrillation: Secondary | ICD-10-CM

## 2023-08-17 ENCOUNTER — Ambulatory Visit: Admitting: Gastroenterology

## 2023-08-22 ENCOUNTER — Ambulatory Visit: Payer: Medicare HMO | Attending: Cardiovascular Disease | Admitting: Cardiovascular Disease

## 2023-08-22 ENCOUNTER — Encounter: Payer: Self-pay | Admitting: Cardiovascular Disease

## 2023-08-22 VITALS — BP 112/62 | HR 50 | Ht 62.0 in | Wt 116.2 lb

## 2023-08-22 DIAGNOSIS — I252 Old myocardial infarction: Secondary | ICD-10-CM

## 2023-08-22 DIAGNOSIS — I480 Paroxysmal atrial fibrillation: Secondary | ICD-10-CM

## 2023-08-22 DIAGNOSIS — D6869 Other thrombophilia: Secondary | ICD-10-CM | POA: Diagnosis not present

## 2023-08-22 DIAGNOSIS — E7800 Pure hypercholesterolemia, unspecified: Secondary | ICD-10-CM

## 2023-08-22 DIAGNOSIS — Z8673 Personal history of transient ischemic attack (TIA), and cerebral infarction without residual deficits: Secondary | ICD-10-CM

## 2023-08-22 DIAGNOSIS — I2510 Atherosclerotic heart disease of native coronary artery without angina pectoris: Secondary | ICD-10-CM | POA: Diagnosis not present

## 2023-08-22 DIAGNOSIS — E039 Hypothyroidism, unspecified: Secondary | ICD-10-CM

## 2023-08-22 DIAGNOSIS — I351 Nonrheumatic aortic (valve) insufficiency: Secondary | ICD-10-CM

## 2023-08-22 DIAGNOSIS — I10 Essential (primary) hypertension: Secondary | ICD-10-CM

## 2023-08-22 NOTE — Progress Notes (Unsigned)
 Cardiology Office Note    Date:  08/23/2023   ID:  Evelyn [name], DOB 24-Aug-1940, MRN [id number]  PCP:  Bennie Pierini, FNP  Cardiologist:   Thurmon Fair, MD   Chief Complaint  Patient presents with   Coronary Artery Disease    History of Present Illness:  Evelyn [name] is a 83 y.o. female with long-standing history of coronary artery disease with previous stents to LAD followed by bypass surgery (LIMA to LAD, SVG to diagonal, SVG to OM in 2004), hyperlipidemia, recent TIA which led to implantation of a loop recorder that promptly demonstrated the presence of asymptomatic paroxysmal atrial fibrillation with rapid ventricular response (loop recorder has reached end of service), mild  aortic insufficiency, hypertension, hyperlipidemia., lumbar compression fracture May 2023.  Has has been a pattern across the last few years she has occasional random episodes of chest discomfort that are not associate with activity, often occur at night, usually resolve with a single nitroglycerin tablet.  Chest discomfort is often accompanied or maybe caused by racing heartbeat.  Only on 1 occasion that she had to take 3 consecutive nitroglycerin tablets.    She is only able to take a relatively low dose of scheduled metoprolol tartrate 25 mg twice daily due to bradycardia (heart rate today is 50 bpm).  She does have a standing order for "as needed" diltiazem 30 mg tablets that she can take for rapid palpitations.  Outside of these occasional events she feels well and does not have exertional anginal dyspnea, orthopnea, PND or lower extremity edema or claudication.  She had 1 episode suggestive of possible TIA, a few years ago, but has never actually had a stroke.  She continues to take care of all her own household chores.  Metabolic control is good with an HDL of 70 and an LDL of 53.  She has normal renal function and she does not have diabetes mellitus.  She does not smoke.  Her blood pressure  is excellent.  She had a previous episode suggesting TIA in May 2018.  She had a CT angiogram that did not show any evidence of meaningful extracranial carotid atherosclerosis or stenoses.  She also had an echocardiogram with a bubble study in May 2018 without any evidence of intracardiac shunt.  She had normal carotid ultrasound in 2014.  Angiography in October 2018 showed virtually no change in coronary anatomy since 2013.  She has a chronically occluded LAD artery with multiple occluded stents, but has good downstream blood flow via LIMA-LAD and SVG-diagonal bypasses.  There is probably a significant area of myocardial septum that is ischemic, upstream of the blocked mid LAD.  There was no mention of a SVG bypass to the oblique marginal branch, but the left circumflex coronary artery was widely patent with no more than 20% stenosis (Chest CTA showed that the SVG-OM is now occluded).  Left ventricular regional wall motion and overall systolic function remain normal and the LVEDP was only 14 mmHg. Although the chest CT angiogram suggested there was a 70% stenosis in the left main coronary artery, that potentially could lead to ischemia in the circumflex territory, this was not found to be the case by invasive angiography.  Had an echocardiogram in May 2018, showing normal LVEF 55-60%, with inferior and inferoseptal mild hypokinesis, mildly dilated ascending aorta, mild aortic insufficiency. In September 2017 her treadmill nuclear stress test showed normal myocardial perfusion and EF 62%. CT angiography of the aorta October 2018 showed unchanged aortic root  dimension at 3.3 cm.  The SVG to OM was described as occluded.  CT angiogram performed 06/08/2023 again shows no evidence of aneurysm with a maximum aortic diameter of 3.3 cm.  She has long-standing history of coronary disease, undergoing her first cardiac catheterization in 1999. She received stents to the LAD artery in 2002 and had to have cutting  balloon atherectomy for in-stent restenosis in 2003 followed by a new LAD stent in 2004 for disease progression. Ultimately after again developing restenosis she underwent coronary bypass surgery (LIMA to LAD, SVG to diagonal, SVG to OM in 2004). She had cardiac catheterization in July 2013 and October 2018, without evidence of significant progression. This demonstrated interval total occlusion of the proximal LAD artery although the distant bypasses are all widely patent. On her stress test in 2013 and 2017  the perfusion images were "normal". It is felt that she may have occasional angina pectoris related to ischemia in the proximal septum since there is no retrograde flow from the bypasses to the proximal LAD. Her last echo in May 2018 showed normal EF 55-60%, but reported inferior and inferoseptal mild hypokinesis. 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  CT angiogram of the head and neck performed in May 2018 for TIA showed mild extracranial atherosclerosis. She has treated systemic hypertension and hyperlipidemia and hypothyroidism.  She had previous cervical spine surgery with bone grafting and discectomy in 2009.    Past Medical History:  Diagnosis Date   Aneurysm [facility])    Aortic insufficiency    Atrial fibrillation (HCC)    CAD (coronary artery disease)    CABG LIMA to LAD,vein graft to diagonal,vein graft to OM   Dyslipidemia    Essential hypertension 09/16/2013   GERD (gastroesophageal reflux disease)    Hypothyroid     Past Surgical History:  Procedure Laterality Date   ABDOMINAL HYSTERECTOMY     BACK SURGERY  2009   BACK SURGERY     CORONARY ANGIOPLASTY  2003   cutting balloon atherectomy in-stent stenosis of the LAD.   CORONARY ANGIOPLASTY WITH STENT PLACEMENT  2002   remote PCI & stenting LAD,   CORONARY ARTERY BYPASS GRAFT  2004   LIMA to LAD,vein graft to diagonal,vein graft to OM   FOOT SURGERY     KYPHOPLASTY N/A  11/24/2021   Procedure: LUMBAR ONE KYPHOPLASTY;  Surgeon: Barnett Abu, MD;  Location: [facility] OR;  Service: Neurosurgery;  Laterality: N/A;  LUMBAR ONE KYPHOPLASTY   LEFT HEART CATH AND CORS/GRAFTS ANGIOGRAPHY N/A 02/23/2017   Procedure: LEFT HEART CATH AND CORS/GRAFTS ANGIOGRAPHY;  Surgeon: Iran Ouch, MD;  Location: [facility] INVASIVE CV LAB;  Service: Cardiovascular;  Laterality: N/A;   LEFT HEART CATHETERIZATION WITH CORONARY ANGIOGRAM N/A 12/15/2011   Procedure: LEFT HEART CATHETERIZATION WITH CORONARY ANGIOGRAM;  Surgeon: Thurmon Fair, MD;  Location: [facility] CATH LAB;  Service: Cardiovascular;  Laterality: N/A;   LOOP RECORDER INSERTION N/A 06/14/2018   Procedure: LOOP RECORDER INSERTION;  Surgeon: Thurmon Fair, MD;  Location: [facility] INVASIVE CV LAB;  Service: Cardiovascular;  Laterality: N/A;   NECK SURGERY     NM MYOCAR PERF WALL MOTION  12/01/2011   normal study-persistent exercise induced ECG changes raises the concern for ischemia (balanced ischemia).   NOSE SURGERY     PACEMAKER INSERTION     NOT PACEMAKER - LOOP RECORDER   TONSILLECTOMY  1948   TUBAL LIGATION      Current Medications: Outpatient  Medications Prior to Visit  Medication Sig Dispense Refill   Cholecalciferol ([facility] D3) 125 MCG (5000 UT) capsule TAKE 1 CAPSULE BY MOUTH EVERY DAY 90 capsule 0   ELIQUIS 2.5 MG TABS tablet TAKE 1 TABLET BY MOUTH TWICE A DAY 180 tablet 1   furosemide (LASIX) 20 MG tablet Take 20 mg by mouth daily.     [START ON 08/30/2023] HYDROcodone-acetaminophen (NORCO/VICODIN) 5-325 MG tablet Take 1 tablet by mouth 2 (two) times daily as needed for moderate pain (pain score 4-6). 60 tablet 0   isosorbide mononitrate (IMDUR) 60 MG 24 hr tablet Take 1 tablet (60 mg total) by mouth daily. 90 tablet 1   levothyroxine (SYNTHROID) 75 MCG tablet TAKE 1 TABLET BY MOUTH DAILY BEFORE BREAKFAST. 90 tablet 1   metoprolol tartrate (LOPRESSOR) 25 MG tablet TAKE 1 TABLET BY MOUTH TWICE A DAY (Patient taking differently:  Take 25 mg by mouth 2 (two) times daily. Can Take Additional 25 mg Tablet For Rapid Heart Rate.) 180 tablet 1   potassium chloride (KLOR-CON) 10 MEQ tablet Take 1 tablet (10 mEq total) by mouth daily. 90 tablet 1   rosuvastatin (CRESTOR) 20 MG tablet Take 1 tablet (20 mg total) by mouth daily. 90 tablet 1   Zoledronic Acid (RECLAST IV) Inject into the vein. Once yearly     acetaminophen (TYLENOL) 500 MG tablet Take 500-1,000 mg by mouth every 6 (six) hours as needed for moderate pain. (Patient not taking: Reported on 08/22/2023)     diltiazem (CARDIZEM) 30 MG tablet TAKE 1 TABLET (30 MG TOTAL) BY MOUTH EVERY 6 (SIX) HOURS AS NEEDED FOR RAPID HEART RATE OVER 120 BPM (Patient not taking: Reported on 08/22/2023) 360 tablet 0   HYDROcodone-acetaminophen (NORCO/VICODIN) 5-325 MG tablet Take 1 tablet by mouth 2 (two) times daily as needed for moderate pain (pain score 4-6). 60 tablet 0   nitroGLYCERIN (NITROSTAT) 0.4 MG SL tablet PLACE 1 TABLET UNDER THE TONGUE EVERY 5 MINUTES AS NEEDED FOR CHEST PAIN. (Patient not taking: Reported on 08/22/2023)     No facility-administered medications prior to visit.     Allergies:   Sulfa antibiotics   Social History   Socioeconomic History   Marital status: Widowed    Spouse name: Not on file   Number of children: 2   Years of education: Associates   Highest education level: Associate degree: academic program  Occupational History   Occupation: Retired  Tobacco Use   Smoking status: Never   Smokeless tobacco: Never  Vaping Use   Vaping status: Never Used  Substance and Sexual Activity   Alcohol use: No   Drug use: No   Sexual activity: Not Currently  Other Topics Concern   Not on file  Social History Narrative   Mother of 2 with 4 grandchildren   Right-handed   Caffeine: 8-10 cups coffee   Pacer    Are you right handed or left handed? right   Are you currently employed ?    What is your current occupation? retired   Do you live at home alone? With  son   Who lives with you?    What type of home do you live in: 1 story or 2 story? one       Social Drivers of Corporate investment banker Strain: Low Risk  (06/23/2023)   Overall Financial Resource Strain (CARDIA)    Difficulty of Paying Living Expenses: Not hard at all  Food Insecurity: No Food Insecurity (06/23/2023)  Hunger Vital Sign    Worried About Running Out of Food in the Last Year: Never true    Ran Out of Food in the Last Year: Never true  Transportation Needs: No Transportation Needs (06/23/2023)   PRAPARE - Administrator, Civil Service (Medical): No    Lack of Transportation (Non-Medical): No  Physical Activity: Insufficiently Active (06/23/2023)   Exercise Vital Sign    Days of Exercise per Week: 3 days    Minutes of Exercise per Session: 30 min  Stress: No Stress Concern Present (06/23/2023)   [employer] of Occupational Health - Occupational Stress Questionnaire    Feeling of Stress : Not at all  Social Connections: Moderately Integrated (06/23/2023)   Social Connection and Isolation Panel [NHANES]    Frequency of Communication with Friends and Family: More than three times a week    Frequency of Social Gatherings with Friends and Family: Three times a week    Attends Religious Services: 1 to 4 times per year    Active Member of Clubs or Organizations: Yes    Attends Banker Meetings: 1 to 4 times per year    Marital Status: Widowed     Family History:  The patient's family history includes Asthma in her maternal grandmother; Cancer (age of onset: 57) in her sister; Heart attack in her mother; Heart disease in her father and sister; Heart failure in her father; Hypertension in her father; Stroke in her brother, daughter, and son; Voice disorder in her daughter.   ROS:   Please see the history of present illness.    ROS All other systems are reviewed and are negative.   PHYSICAL EXAM:   VS:  BP 112/62 (BP Location: Left Arm, Patient  Position: Sitting)   Pulse (!) 50   Ht 5\' 2"  (1.575 m)   Wt 116 lb 3.2 oz (52.7 kg)   SpO2 94%   BMI 21.25 kg/m       General: Alert, oriented x3, no distress, she appears lean and fit, looks younger than stated age and appears quite agile Head: no evidence of trauma, PERRL, EOMI, no exophtalmos or lid lag, no myxedema, no xanthelasma; normal ears, nose and oropharynx Neck: normal jugular venous pulsations and no hepatojugular reflux; brisk carotid pulses without delay and no carotid bruits Chest: clear to auscultation, no signs of consolidation by percussion or palpation, normal fremitus, symmetrical and full respiratory excursions Cardiovascular: normal position and quality of the apical impulse, regular rhythm, normal first and second heart sounds, no murmurs, rubs or gallops Abdomen: no tenderness or distention, no masses by palpation, no abnormal pulsatility or arterial bruits, normal bowel sounds, no hepatosplenomegaly Extremities: no clubbing, cyanosis or edema; 2+ radial, ulnar and brachial pulses bilaterally; 2+ right femoral, posterior tibial and dorsalis pedis pulses; 2+ left femoral, posterior tibial and dorsalis pedis pulses; no subclavian or femoral bruits Neurological: grossly nonfocal Psych: Normal mood and affect    Wt Readings from Last 3 Encounters:  08/22/23 116 lb 3.2 oz (52.7 kg)  07/19/23 115 lb (52.2 kg)  06/30/23 116 lb (52.6 kg)      Studies/Labs Reviewed:   ECHO 02/18/2023  IMPRESSIONS Left ventricular ejection fraction, by estimation, is 55%. The left ventricle has normal function. Left ventricular endocardial border not optimally defined to evaluate regional wall motion. Left ventricular diastolic parameters are indeterminate. 1. Right ventricular systolic function is normal. The right ventricular size is normal. There is moderately elevated pulmonary artery systolic pressure.  2. 3. Left atrial size was mildly dilated. 4. Right atrial size was mildly  dilated. 5. The mitral valve is abnormal. Mild mitral valve regurgitation. 6. Tricuspid valve regurgitation is moderate. The aortic valve is tricuspid. There is moderate calcification of the aortic valve. There is moderate thickening of the aortic valve. Aortic valve regurgitation is mild. Aortic valve sclerosis is present, with no evidence of aortic valve stenosis.   EKG:   The tracings from 11/30/2021 were personally reviewed.  The initial tracing shows atrial fibrillation rapid ventricular response, subsequent tracing shows sinus rhythm.    EKG Interpretation Date/Time:  Monday August 22 2023 14:46:32 EDT Ventricular Rate:  50 PR Interval:  168 QRS Duration:  92 QT Interval:  456 QTC Calculation: 415 R Axis:   0  Text Interpretation: Sinus bradycardia Minimal voltage criteria for LVH, may be normal variant ( R in aVL ) When compared with ECG of 17-Feb-2023 18:52, No significant change since last tracing Confirmed by Haleema Vanderheyden ([id number]) on 08/22/2023 3:02:55 PM         06/30/2023: ALT 11; BUN 14; Creatinine, Ser 0.81; Hemoglobin 14.3; Platelets 246; Potassium 4.1; Sodium 140; TSH 1.210   Lipid Panel    Component Value Date/Time   CHOL 138 06/30/2023 1031   TRIG 74 06/30/2023 1031   HDL 70 06/30/2023 1031   CHOLHDL 2.0 06/30/2023 1031   CHOLHDL 2.0 07/11/2014 0913   VLDL 12 07/11/2014 0913   LDLCALC 53 06/30/2023 1031     ASSESSMENT:    1. Paroxysmal atrial fibrillation (HCC)   2. Acquired thrombophilia (HCC)   3. History of TIA (transient ischemic attack)   4. 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 Essential hypertension   6. Hypercholesterolemia   7. Nonrheumatic aortic (valve) insufficiency   8. Acquired hypothyroidism      PLAN:  In order of problems listed above:  AFib/AFlutter: She continues to have very infrequent episodes of atrial fibrillation, but these remain symptomatic and associated chest pain.  When her loop  recorder was still functional that showed rather extreme tachycardia during atrial fibrillation despite medications.  Unable to increase beta-blockers further due to sinus bradycardia.  We referred her to discuss ablation in the past, but the episodes were so infrequent that this was felt to be unnecessary.  Since the episodes are so infrequent we are still following a conservative approach without antiarrhythmic medication or ablation.  Okay to continue taking as needed immediate release diltiazem for spells of symptomatic A-fib with RVR.   Anticoagulation: She has a history of TIA and high embolic risk (CHA2DS2-VASc 6).  Compliant with anticoagulation, dose adjusted for small body size and age.  Has not had any bleeding problems. TIA: She had an episode suggestive of dysarthria/aphasia lasting for about 20 minutes in 2018, this has not recurred.  No evidence of significant carotid disease on CT angiography of the neck.  s. CAD s/p CABG: Infrequent chest pain that is promptly relieved by sublingual nitroglycerin.  Only becomes symptomatic during episodes of rapid atrial fibrillation.  In the past that had some episodes of chest discomfort at rest that could represent vasospasm.   May also have ischemia in the proximal septum downstream of the occluded LAD artery but upstream of the LIMA supplied territory.  Also has an occluded SVG to the oblique marginal artery, although the native circumflex artery is patent.  Not on aspirin due to anticoagulation.  On beta-blockers and long-acting nitrates.  HTN: Well-controlled on beta-blocker monotherapy. HLP: All lipid parameters are within target range on rosuvastatin.  Continue AI: Mild by most recent echocardiogram report in Oct 2024.  Due to aortoannular ectasia.  Asymptomatic and no audible murmur on exam.  Unlikely to be an aortic valve replacement candidate since she has had previous sternotomy. Asc Ao Aneurysm: She was labeled as having this in the past, but the  last 2 CT angiogram of the chest have shown normal and stable ascending aortic diameter of 3.3 cm. Acquired hypothyroidism/iatrogenic hyperthyroidism: Multinodular goiter.  Normal TSH 06/30/2023.  Clinically euthyroid.   Medication Adjustments/Labs and Tests Ordered: Current medicines are reviewed at length with the patient today.  Concerns regarding medicines are outlined above.  Medication changes, Labs and Tests ordered today are listed in the Patient Instructions below. Patient Instructions  Medication Instructions:  No changes *If you need a refill on your cardiac medications before your next appointment, please call your pharmacy*  Follow-Up: At [facility], you and your health needs are our priority.  As part of our continuing mission to provide you with exceptional heart care, our providers are all part of one team.  This team includes your primary Cardiologist (physician) and Advanced Practice Providers or APPs (Physician Assistants and Nurse Practitioners) who all work together to provide you with the care you need, when you need it.  Your next appointment:   1 year(s)  Provider:   Thurmon Fair, MD     We recommend signing up for the patient portal called "MyChart".  Sign up information is provided on this After Visit Summary.  MyChart is used to connect with patients for Virtual Visits (Telemedicine).  Patients are able to view lab/test results, encounter notes, upcoming appointments, etc.  Non-urgent messages can be sent to your provider as well.   To learn more about what you can do with MyChart, go to [email or web address].         1st Floor: - Lobby - Registration  - Pharmacy  - Lab - Cafe  2nd Floor: - PV Lab - Diagnostic Testing (echo, CT, nuclear med)  3rd Floor: - Vacant  4th Floor: - TCTS (cardiothoracic surgery) - AFib Clinic - Structural Heart Clinic - Vascular Surgery  - Vascular Ultrasound  5th Floor: - HeartCare Cardiology  (general and EP) - Clinical Pharmacy for coumadin, hypertension, lipid, weight-loss medications, and med management appointments    Valet parking services will be available as well.       Signed, Thurmon Fair, MD  08/23/2023 2:07 PM    [facility] [address] [place], [place], [place]  [address] Phone: [phone]; Fax: [phone]

## 2023-08-22 NOTE — Patient Instructions (Signed)
 Medication Instructions:  No changes *If you need a refill on your cardiac medications before your next appointment, please call your pharmacy*  Follow-Up: At [facility], you and your health needs are our priority.  As part of our continuing mission to provide you with exceptional heart care, our providers are all part of one team.  This team includes your primary Cardiologist (physician) and Advanced Practice Providers or APPs (Physician Assistants and Nurse Practitioners) who all work together to provide you with the care you need, when you need it.  Your next appointment:   1 year(s)  Provider:   Thurmon Fair, MD     We recommend signing up for the patient portal called "MyChart".  Sign up information is provided on this After Visit Summary.  MyChart is used to connect with patients for Virtual Visits (Telemedicine).  Patients are able to view lab/test results, encounter notes, upcoming appointments, etc.  Non-urgent messages can be sent to your provider as well.   To learn more about what you can do with MyChart, go to [email or web address].        1st Floor: - Lobby - Registration  - Pharmacy  - Lab - Cafe  2nd Floor: - PV Lab - Diagnostic Testing (echo, CT, nuclear med)  3rd Floor: - Vacant  4th Floor: - TCTS (cardiothoracic surgery) - AFib Clinic - Structural Heart Clinic - Vascular Surgery  - Vascular Ultrasound  5th Floor: - HeartCare Cardiology (general and EP) - Clinical Pharmacy for coumadin, hypertension, lipid, weight-loss medications, and med management appointments    Valet parking services will be available as well.

## 2023-08-28 ENCOUNTER — Other Ambulatory Visit: Payer: Self-pay | Admitting: "Endocrinology

## 2023-08-28 DIAGNOSIS — E039 Hypothyroidism, unspecified: Secondary | ICD-10-CM

## 2023-09-03 ENCOUNTER — Other Ambulatory Visit: Payer: Self-pay | Admitting: Nurse Practitioner

## 2023-09-03 DIAGNOSIS — I25718 Atherosclerosis of autologous vein coronary artery bypass graft(s) with other forms of angina pectoris: Secondary | ICD-10-CM

## 2023-09-23 ENCOUNTER — Ambulatory Visit: Payer: Medicare HMO | Admitting: Nurse Practitioner

## 2023-09-23 ENCOUNTER — Encounter: Payer: Self-pay | Admitting: Nurse Practitioner

## 2023-09-23 VITALS — BP 117/78 | HR 70 | Temp 97.5°F | Ht 62.0 in | Wt 116.0 lb

## 2023-09-23 DIAGNOSIS — M5030 Other cervical disc degeneration, unspecified cervical region: Secondary | ICD-10-CM

## 2023-09-23 MED ORDER — HYDROCODONE-ACETAMINOPHEN 5-325 MG PO TABS: 1.0000 | ORAL_TABLET | Freq: Two times a day (BID) | ORAL | 0 refills | Status: AC | PRN

## 2023-09-23 MED ORDER — HYDROCODONE-ACETAMINOPHEN 5-325 MG PO TABS: 1.0000 | ORAL_TABLET | Freq: Two times a day (BID) | ORAL | 0 refills | Status: DC | PRN

## 2023-09-23 NOTE — Progress Notes (Signed)
 Subjective:    Patient ID: Evelyn [name], female    DOB: 1941/03/22, 83 y.o.   MRN: [id number]   Chief Complaint: pain management  HPI  Pain assessment: Cause of pain- DDD Pain location- lower back Pain on scale of 1-10- 7-8/10 Frequency- daily What increases pain-nothing really What makes pain Better-rest helps Effects on ADL - none Any change in general medical condition-none  Current opioids rx- norco 5/325 BID # meds rx- 60 Effectiveness of current meds-helps Adverse reactions from pain meds-none Morphine  equivalent-  Pill count performed-No Last drug screen - 08/23/22 ( high risk q44m, moderate risk q72m, low risk yearly ) Urine drug screen today- No Was the [facility] reviewed- yes  If yes were their any concerning findings? - no   Overdose risk: 1   Pain contract signed on:11/23/22  Patient Active Problem List   Diagnosis Date Noted   Fall at home, initial encounter 02/18/2023   Fracture of nasal bone 02/18/2023   Fracture of right orbital floor (HCC) 02/18/2023   Fracture of maxillary sinus (HCC) 02/18/2023   Atrial fibrillation, chronic (HCC) 02/18/2023   Syncope 02/17/2023   Hypotension 11/25/2022   Intractable back pain 11/20/2021   Acquired thrombophilia (HCC) 11/20/2021   Hypokalemia 11/20/2021   Compression fracture of L1 vertebra (HCC) 11/20/2021   Paroxysmal atrial fibrillation (HCC) 12/18/2019   Cervical radiculopathy at C5 08/07/2019   Long term (current) use of anticoagulants 11/25/2018   DDD (degenerative disc disease), cervical 10/17/2018   Mixed hyperlipidemia 10/17/2018   Hemispheric carotid artery syndrome 06/14/2018   Pain management 08/01/2017   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 CABG X 3 2004 12/15/2011   Hypothyroidism 12/15/2011       Review of Systems  Constitutional:  Negative for diaphoresis.  Eyes:  Negative for pain.  Respiratory:  Negative for shortness of breath.   Cardiovascular:  Negative for chest pain, palpitations and leg swelling.  Gastrointestinal:  Negative for abdominal pain.  Endocrine: Negative for polydipsia.  Musculoskeletal:  Positive for back pain.  Skin:  Negative for rash.  Neurological:  Negative for dizziness, weakness and headaches.  Hematological:  Does not bruise/bleed easily.  All other systems reviewed and are negative.      Objective:   Physical Exam Constitutional:      Appearance: Normal appearance.  Cardiovascular:     Rate and Rhythm: Normal rate and regular rhythm.     Heart sounds: Normal heart sounds.  Pulmonary:     Breath sounds: Normal breath sounds.  Musculoskeletal:     Comments: Rises slowly from sitting  to standing Lumbar pain on rotation and extension (-) SLR bil Motor strength and sensation distally intact  Skin:    General: Skin is warm.  Neurological:     General: No focal deficit present.     Mental Status: She is alert and oriented to person, place, and time.  Psychiatric:        Mood and Affect: Mood normal.  Behavior: Behavior normal.    BP 117/78   Pulse 70   Temp (!) 97.5 F (36.4 C) (Temporal)   Ht 5\' 2"  (1.575 m)   Wt 116 lb (52.6 kg)   SpO2 91%   BMI 21.22 kg/m          Assessment & Plan:   Evelyn [name] in today with chief complaint of pain management  1. DDD (degenerative disc disease), cervical Moist heat Rest  RTO prn  Meds ordered this encounter  Medications    HYDROcodone -acetaminophen  (NORCO/VICODIN) 5-325 MG tablet    Sig: Take 1 tablet by mouth 2 (two) times daily as needed for moderate pain (pain score 4-6).    Dispense:  60 tablet    Refill:  0    Supervising Provider:   DETTINGER, JOSHUA A [[id number]]   HYDROcodone -acetaminophen  (NORCO/VICODIN) 5-325 MG tablet    Sig: Take 1 tablet by mouth 2 (two) times daily as needed for moderate pain (pain score 4-6).    Dispense:  60 tablet    Refill:  0    Supervising Provider:   DETTINGER, JOSHUA A [[id number]]   HYDROcodone -acetaminophen  (NORCO/VICODIN) 5-325 MG tablet    Sig: Take 1 tablet by mouth 2 (two) times daily as needed for moderate pain (pain score 4-6).    Dispense:  60 tablet    Refill:  0    Supervising Provider:   Jolyne Needs A [[id number]]        The above assessment and management plan was discussed with the patient. The patient verbalized understanding of and has agreed to the management plan. Patient is aware to call the clinic if symptoms persist or worsen. Patient is aware when to return to the clinic for a follow-up visit. Patient educated on when it is appropriate to go to the emergency department.   Mary-Margaret Gaylyn Keas, FNP

## 2023-10-19 ENCOUNTER — Ambulatory Visit: Admitting: Gastroenterology

## 2023-10-19 ENCOUNTER — Encounter: Payer: Self-pay | Admitting: Gastroenterology

## 2023-10-19 ENCOUNTER — Other Ambulatory Visit (INDEPENDENT_AMBULATORY_CARE_PROVIDER_SITE_OTHER)

## 2023-10-19 VITALS — BP 146/74 | HR 56 | Ht 62.0 in

## 2023-10-19 DIAGNOSIS — R1011 Right upper quadrant pain: Secondary | ICD-10-CM

## 2023-10-19 DIAGNOSIS — K449 Diaphragmatic hernia without obstruction or gangrene: Secondary | ICD-10-CM | POA: Diagnosis not present

## 2023-10-19 DIAGNOSIS — R195 Other fecal abnormalities: Secondary | ICD-10-CM

## 2023-10-19 DIAGNOSIS — I25718 Atherosclerosis of autologous vein coronary artery bypass graft(s) with other forms of angina pectoris: Secondary | ICD-10-CM

## 2023-10-19 DIAGNOSIS — I482 Chronic atrial fibrillation, unspecified: Secondary | ICD-10-CM

## 2023-10-19 LAB — COMPREHENSIVE METABOLIC PANEL WITH GFR
ALT: 11 U/L (ref 0–35)
AST: 15 U/L (ref 0–37)
Albumin: 4.2 g/dL (ref 3.5–5.2)
Alkaline Phosphatase: 48 U/L (ref 39–117)
BUN: 13 mg/dL (ref 6–23)
CO2: 26 meq/L (ref 19–32)
Calcium: 9.4 mg/dL (ref 8.4–10.5)
Chloride: 107 meq/L (ref 96–112)
Creatinine, Ser: 0.59 mg/dL (ref 0.40–1.20)
GFR: 82.29 mL/min (ref >60.00)
Glucose, Bld: 86 mg/dL (ref 70–99)
Potassium: 3.8 meq/L (ref 3.5–5.1)
Sodium: 139 meq/L (ref 135–145)
Total Bilirubin: 0.6 mg/dL (ref 0.2–1.2)
Total Protein: 7.1 g/dL (ref 6.0–8.3)

## 2023-10-19 LAB — CBC WITH DIFFERENTIAL/PLATELET
Basophils Absolute: 0 10*3/uL (ref 0.0–0.1)
Basophils Relative: 0.6 % (ref 0.0–3.0)
Eosinophils Absolute: 0.1 10*3/uL (ref 0.0–0.7)
Eosinophils Relative: 1 % (ref 0.0–5.0)
HCT: 38.5 % (ref 36.0–46.0)
Hemoglobin: 13.1 g/dL (ref 12.0–15.0)
Lymphocytes Relative: 37.1 % (ref 12.0–46.0)
Lymphs Abs: 2.7 10*3/uL (ref 0.7–4.0)
MCHC: 34 g/dL (ref 30.0–36.0)
MCV: 92.4 fl (ref 78.0–100.0)
Monocytes Absolute: 0.7 10*3/uL (ref 0.1–1.0)
Monocytes Relative: 9.9 % (ref 3.0–12.0)
Neutro Abs: 3.8 10*3/uL (ref 1.4–7.7)
Neutrophils Relative %: 51.4 % (ref 43.0–77.0)
Platelets: 200 10*3/uL (ref 150.0–400.0)
RBC: 4.17 Mil/uL (ref 3.87–5.11)
RDW: 14.1 % (ref 11.5–15.5)
WBC: 7.3 10*3/uL (ref 4.0–10.5)

## 2023-10-19 NOTE — Patient Instructions (Addendum)
 Your provider has requested that you go to the basement level for lab work before leaving today. Press "B" on the elevator. The lab is located at the first door on the left as you exit the elevator.  You have been scheduled for an abdominal ultrasound at [facility] (1st floor of hospital) on 10/31/23 at 10:30am. Please arrive 15 minutes prior to your appointment for registration. Make certain not to have anything to eat or drink 6 hours prior to your appointment. Should you need to reschedule your appointment, please contact radiology at [phone]. This test typically takes about 30 minutes to perform.  _______________________________________________________  If your blood pressure at your visit was 140/90 or greater, please contact your primary care physician to follow up on this.  _______________________________________________________  If you are age 85 or older, your body mass index should be between 23-30. Your Body mass index is 21.22 kg/m. If this is out of the aforementioned range listed, please consider follow up with your Primary Care Provider.  If you are age 5 or younger, your body mass index should be between 19-25. Your Body mass index is 21.22 kg/m. If this is out of the aformentioned range listed, please consider follow up with your Primary Care Provider.   ________________________________________________________  The [facility] providers would like to encourage you to use MYCHART to communicate with providers for non-urgent requests or questions.  Due to long hold times on the telephone, sending your provider a message by [facility] may be a faster and more efficient way to get a response.  Please allow 48 business hours for a response.  Please remember that this is for non-urgent requests.  _______________________________________________________   It was a pleasure to see you today!  Thank you for trusting me with your gastrointestinal care!    Scott E.Cherryl Corona,  MD

## 2023-10-19 NOTE — Progress Notes (Unsigned)
 HPI :     Dark stools for about 3-4 stools. Not feeling dizzy  Pain lasted all day. Did not eat. Black stools     Past Medical History:  Diagnosis Date   Aneurysm [facility])    Aortic insufficiency    Atrial fibrillation (HCC)    CAD (coronary artery disease)    CABG LIMA to LAD,vein graft to diagonal,vein graft to OM   Dyslipidemia    Essential hypertension 09/16/2013   GERD (gastroesophageal reflux disease)    Hypothyroid      Past Surgical History:  Procedure Laterality Date   ABDOMINAL HYSTERECTOMY     BACK SURGERY  2009   BACK SURGERY     CORONARY ANGIOPLASTY  2003   cutting balloon atherectomy in-stent stenosis of the LAD.   CORONARY ANGIOPLASTY WITH STENT PLACEMENT  2002   remote PCI & stenting LAD,   CORONARY ARTERY BYPASS GRAFT  2004   LIMA to LAD,vein graft to diagonal,vein graft to OM   FOOT SURGERY     KYPHOPLASTY N/A 11/24/2021   Procedure: LUMBAR ONE KYPHOPLASTY;  Surgeon: Elna Haggis, MD;  Location: [facility] OR;  Service: Neurosurgery;  Laterality: N/A;  LUMBAR ONE KYPHOPLASTY   LEFT HEART CATH AND CORS/GRAFTS ANGIOGRAPHY N/A 02/23/2017   Procedure: LEFT HEART CATH AND CORS/GRAFTS ANGIOGRAPHY;  Surgeon: Wenona Hamilton, MD;  Location: [facility] INVASIVE CV LAB;  Service: Cardiovascular;  Laterality: N/A;   LEFT HEART CATHETERIZATION WITH CORONARY ANGIOGRAM N/A 12/15/2011   Procedure: LEFT HEART CATHETERIZATION WITH CORONARY ANGIOGRAM;  Surgeon: Luana Rumple, MD;  Location: [facility] CATH LAB;  Service: Cardiovascular;  Laterality: N/A;   LOOP RECORDER INSERTION N/A 06/14/2018   Procedure: LOOP RECORDER INSERTION;  Surgeon: Luana Rumple, MD;  Location: [facility] INVASIVE CV LAB;  Service: Cardiovascular;  Laterality: N/A;   NECK SURGERY     NM MYOCAR PERF WALL MOTION  12/01/2011   normal study-persistent exercise induced ECG changes raises the concern for ischemia (balanced ischemia).   NOSE SURGERY     PACEMAKER INSERTION     NOT PACEMAKER - LOOP RECORDER    TONSILLECTOMY  1948   TUBAL LIGATION     Family History  Problem Relation Age of Onset   Heart attack Mother    Heart failure Father    Hypertension Father    Heart disease Father    Asthma Maternal Grandmother    Heart disease Sister    Stroke Brother    Voice disorder Daughter        vocal dystonia   Stroke Daughter    Stroke Son    Cancer Sister 16   Social History   Tobacco Use   Smoking status: Never   Smokeless tobacco: Never  Vaping Use   Vaping status: Never Used  Substance Use Topics   Alcohol use: No   Drug use: No   Current Outpatient Medications  Medication Sig Dispense Refill   acetaminophen  (TYLENOL ) 500 MG tablet Take 500-1,000 mg by mouth every 6 (six) hours as needed for moderate pain (pain score 4-6).     Cholecalciferol ([employer] D3) 125 MCG (5000 UT) capsule TAKE 1 CAPSULE BY MOUTH EVERY DAY 90 capsule 0   diltiazem  (CARDIZEM ) 30 MG tablet TAKE 1 TABLET (30 MG TOTAL) BY MOUTH EVERY 6 (SIX) HOURS AS NEEDED FOR RAPID HEART RATE OVER 120 BPM 360 tablet 0   ELIQUIS  2.5 MG TABS tablet TAKE 1 TABLET BY MOUTH TWICE A DAY 180 tablet 1   furosemide  (LASIX ) 20  MG tablet Take 20 mg by mouth daily.     [START ON 12/23/2023] HYDROcodone -acetaminophen  (NORCO/VICODIN) 5-325 MG tablet Take 1 tablet by mouth 2 (two) times daily as needed for moderate pain (pain score 4-6). 60 tablet 0   [START ON 10/23/2023] HYDROcodone -acetaminophen  (NORCO/VICODIN) 5-325 MG tablet Take 1 tablet by mouth 2 (two) times daily as needed for moderate pain (pain score 4-6). 60 tablet 0   HYDROcodone -acetaminophen  (NORCO/VICODIN) 5-325 MG tablet Take 1 tablet by mouth 2 (two) times daily as needed for moderate pain (pain score 4-6). 60 tablet 0   isosorbide  mononitrate (IMDUR ) 60 MG 24 hr tablet TAKE 1 TABLET (60 MG TOTAL) DAILY BY MOUTH. 90 tablet 0   levothyroxine  (SYNTHROID ) 75 MCG tablet Please specify directions, refills and quantity 90 tablet 0   metoprolol  tartrate (LOPRESSOR ) 25 MG tablet TAKE 1  TABLET BY MOUTH TWICE A DAY (Patient taking differently: Take 25 mg by mouth 2 (two) times daily. Can Take Additional 25 mg Tablet For Rapid Heart Rate.) 180 tablet 1   nitroGLYCERIN  (NITROSTAT ) 0.4 MG SL tablet      potassium chloride  (KLOR-CON ) 10 MEQ tablet Take 1 tablet (10 mEq total) by mouth daily. 90 tablet 1   rosuvastatin  (CRESTOR ) 20 MG tablet Take 1 tablet (20 mg total) by mouth daily. 90 tablet 1   Zoledronic  Acid (RECLAST  IV) Inject into the vein. Once yearly     No current facility-administered medications for this visit.   Allergies  Allergen Reactions   Sulfa Antibiotics Swelling     Review of Systems: All systems reviewed and negative except where noted in HPI.    No results found.  Physical Exam: There were no vitals taken for this visit. Constitutional: Pleasant,well-developed, ***female in no acute distress. HEENT: Normocephalic and atraumatic. Conjunctivae are normal. No scleral icterus. Neck supple.  Cardiovascular: Normal rate, regular rhythm.  Pulmonary/chest: Effort normal and breath sounds normal. No wheezing, rales or rhonchi. Abdominal: Soft, nondistended, nontender. Bowel sounds active throughout. There are no masses palpable. No hepatomegaly. Extremities: no edema Lymphadenopathy: No cervical adenopathy noted. Neurological: Alert and oriented to person place and time. Skin: Skin is warm and dry. No rashes noted. Psychiatric: Normal mood and affect. Behavior is normal.  CBC    Component Value Date/Time   WBC 9.6 06/30/2023 1031   WBC 7.9 02/17/2023 1840   RBC 4.56 06/30/2023 1031   RBC 4.04 02/17/2023 1840   HGB 14.3 06/30/2023 1031   HCT 43.2 06/30/2023 1031   PLT 246 06/30/2023 1031   MCV 95 06/30/2023 1031   MCH 31.4 06/30/2023 1031   MCH 30.9 02/17/2023 1840   MCHC 33.1 06/30/2023 1031   MCHC 33.2 02/17/2023 1840   RDW 13.1 06/30/2023 1031   LYMPHSABS 4.0 (H) 06/30/2023 1031   MONOABS 0.7 08/22/2022 1545   EOSABS 0.1 06/30/2023 1031    BASOSABS 0.0 06/30/2023 1031    CMP     Component Value Date/Time   NA 140 06/30/2023 1031   K 4.1 06/30/2023 1031   CL 101 06/30/2023 1031   CO2 22 06/30/2023 1031   GLUCOSE 82 06/30/2023 1031   GLUCOSE 111 (H) 02/17/2023 1840   BUN 14 06/30/2023 1031   CREATININE 0.81 06/30/2023 1031   CREATININE 0.59 07/11/2014 0913   CALCIUM  9.7 06/30/2023 1031   PROT 7.2 06/30/2023 1031   ALBUMIN 4.5 06/30/2023 1031   AST 16 06/30/2023 1031   ALT 11 06/30/2023 1031   ALKPHOS 61 06/30/2023 1031   BILITOT 0.4  06/30/2023 1031   GFRNONAA >60 02/17/2023 1840   GFRAA 98 06/26/2020 1116       Latest Ref Rng & Units 06/30/2023   10:31 AM 02/17/2023    6:40 PM 08/22/2022    3:45 PM  CBC EXTENDED  WBC 3.4 - 10.8 x10E3/uL 9.6  7.9  11.8   RBC 3.77 - 5.28 x10E6/uL 4.56  4.04  4.20   Hemoglobin 11.1 - 15.9 g/dL 16.1  09.6  04.5   HCT 34.0 - 46.6 % 43.2  37.7  39.6   Platelets 150 - 450 x10E3/uL 246  199  206   NEUT# 1.4 - 7.0 x10E3/uL 4.6   8.5   Lymph# 0.7 - 3.1 x10E3/uL 4.0   2.6       ASSESSMENT AND PLAN:  Gaylyn Keas, Mary-Margaret, *

## 2023-10-22 ENCOUNTER — Other Ambulatory Visit: Payer: Self-pay | Admitting: Nurse Practitioner

## 2023-10-22 DIAGNOSIS — I480 Paroxysmal atrial fibrillation: Secondary | ICD-10-CM

## 2023-10-24 ENCOUNTER — Ambulatory Visit: Payer: Self-pay | Admitting: Gastroenterology

## 2023-10-24 NOTE — Progress Notes (Signed)
 Evelyn [name],  Please let Evelyn [name] know her labs looked great, not concerning at all for bleeding of the upper GI tract.   Awaiting results of stool test and ultrasound.

## 2023-10-28 ENCOUNTER — Other Ambulatory Visit

## 2023-10-28 ENCOUNTER — Ambulatory Visit (HOSPITAL_COMMUNITY)

## 2023-10-28 DIAGNOSIS — R195 Other fecal abnormalities: Secondary | ICD-10-CM

## 2023-10-28 DIAGNOSIS — R1011 Right upper quadrant pain: Secondary | ICD-10-CM

## 2023-10-30 LAB — H. PYLORI ANTIGEN, STOOL: H pylori Ag, Stl: NEGATIVE

## 2023-11-01 NOTE — Progress Notes (Signed)
 Evelyn [name],  Your stool test for H. Pylori was negative (normal).  Will await ultrasound results.  Maya,  Please relay above message.

## 2023-11-04 ENCOUNTER — Ambulatory Visit (HOSPITAL_COMMUNITY)
Admission: RE | Admit: 2023-11-04 | Discharge: 2023-11-04 | Disposition: A | Discharge status: Home / Self Care | Source: Ambulatory Visit | Attending: Gastroenterology | Admitting: Gastroenterology

## 2023-11-04 DIAGNOSIS — R1011 Right upper quadrant pain: Secondary | ICD-10-CM | POA: Insufficient documentation

## 2023-11-04 DIAGNOSIS — R195 Other fecal abnormalities: Secondary | ICD-10-CM | POA: Insufficient documentation

## 2023-11-10 NOTE — Progress Notes (Signed)
 Evelyn [name],  Your ultrasound was normal.  No gallstones.  Have you had any recurrence of your abdominal pain?

## 2023-11-24 NOTE — Telephone Encounter (Signed)
 Inbound call from patient, following up on US  results, she states she has not had any more abdominal pain reoccurrence only indigestion. Patient states she does not have MyChart and could not access results note.

## 2023-12-18 ENCOUNTER — Other Ambulatory Visit: Payer: Self-pay | Admitting: "Endocrinology

## 2023-12-18 ENCOUNTER — Other Ambulatory Visit: Payer: Self-pay | Admitting: Adult Health

## 2023-12-18 DIAGNOSIS — E039 Hypothyroidism, unspecified: Secondary | ICD-10-CM

## 2023-12-19 ENCOUNTER — Telehealth: Payer: Self-pay | Admitting: "Endocrinology

## 2023-12-19 ENCOUNTER — Other Ambulatory Visit: Payer: Self-pay | Admitting: *Deleted

## 2023-12-19 DIAGNOSIS — I959 Hypotension, unspecified: Secondary | ICD-10-CM

## 2023-12-19 DIAGNOSIS — I480 Paroxysmal atrial fibrillation: Secondary | ICD-10-CM

## 2023-12-19 DIAGNOSIS — E039 Hypothyroidism, unspecified: Secondary | ICD-10-CM

## 2023-12-19 DIAGNOSIS — E042 Nontoxic multinodular goiter: Secondary | ICD-10-CM

## 2023-12-19 DIAGNOSIS — M5030 Other cervical disc degeneration, unspecified cervical region: Secondary | ICD-10-CM

## 2023-12-19 DIAGNOSIS — E782 Mixed hyperlipidemia: Secondary | ICD-10-CM

## 2023-12-19 NOTE — Telephone Encounter (Signed)
 Pt needs labs updated

## 2023-12-19 NOTE — Telephone Encounter (Signed)
 Labs updated

## 2023-12-22 ENCOUNTER — Ambulatory Visit: Admitting: Nurse Practitioner

## 2023-12-22 ENCOUNTER — Encounter: Payer: Self-pay | Admitting: Nurse Practitioner

## 2023-12-22 VITALS — BP 120/62 | HR 54 | Temp 97.7°F | Ht 62.0 in | Wt 115.0 lb

## 2023-12-22 DIAGNOSIS — E876 Hypokalemia: Secondary | ICD-10-CM

## 2023-12-22 DIAGNOSIS — M5030 Other cervical disc degeneration, unspecified cervical region: Secondary | ICD-10-CM

## 2023-12-22 DIAGNOSIS — I7121 Aneurysm of the ascending aorta, without rupture: Secondary | ICD-10-CM

## 2023-12-22 DIAGNOSIS — I25718 Atherosclerosis of autologous vein coronary artery bypass graft(s) with other forms of angina pectoris: Secondary | ICD-10-CM

## 2023-12-22 DIAGNOSIS — I480 Paroxysmal atrial fibrillation: Secondary | ICD-10-CM

## 2023-12-22 DIAGNOSIS — G451 Carotid artery syndrome (hemispheric): Secondary | ICD-10-CM

## 2023-12-22 DIAGNOSIS — F419 Anxiety disorder, unspecified: Secondary | ICD-10-CM

## 2023-12-22 DIAGNOSIS — E782 Mixed hyperlipidemia: Secondary | ICD-10-CM

## 2023-12-22 DIAGNOSIS — I10 Essential (primary) hypertension: Secondary | ICD-10-CM

## 2023-12-22 DIAGNOSIS — Z0001 Encounter for general adult medical examination with abnormal findings: Secondary | ICD-10-CM | POA: Diagnosis not present

## 2023-12-22 DIAGNOSIS — E039 Hypothyroidism, unspecified: Secondary | ICD-10-CM

## 2023-12-22 DIAGNOSIS — Z0000 Encounter for general adult medical examination without abnormal findings: Secondary | ICD-10-CM

## 2023-12-22 LAB — LIPID PANEL

## 2023-12-22 MED ORDER — HYDROCODONE-ACETAMINOPHEN 5-325 MG PO TABS: 1.0000 | ORAL_TABLET | Freq: Two times a day (BID) | ORAL | 0 refills | Status: DC | PRN

## 2023-12-22 MED ORDER — ROSUVASTATIN CALCIUM 20 MG PO TABS: 20.0000 mg | ORAL_TABLET | Freq: Every day | ORAL | 1 refills | Status: AC

## 2023-12-22 MED ORDER — HYDROCODONE-ACETAMINOPHEN 5-325 MG PO TABS: 1.0000 | ORAL_TABLET | Freq: Two times a day (BID) | ORAL | 0 refills | Status: AC | PRN

## 2023-12-22 MED ORDER — DILTIAZEM HCL 30 MG PO TABS: ORAL_TABLET | ORAL | 1 refills | Status: AC

## 2023-12-22 MED ORDER — APIXABAN 2.5 MG PO TABS
2.5000 mg | ORAL_TABLET | Freq: Two times a day (BID) | ORAL | 1 refills | Status: AC
Start: 2023-12-22 — End: ?

## 2023-12-22 MED ORDER — ISOSORBIDE MONONITRATE ER 60 MG PO TB24: 60.0000 mg | ORAL_TABLET | Freq: Every day | ORAL | 1 refills | Status: AC

## 2023-12-22 MED ORDER — POTASSIUM CHLORIDE ER 10 MEQ PO TBCR: 10.0000 meq | EXTENDED_RELEASE_TABLET | Freq: Every day | ORAL | 1 refills | Status: AC

## 2023-12-22 MED ORDER — FUROSEMIDE 20 MG PO TABS: 20.0000 mg | ORAL_TABLET | Freq: Every day | ORAL | 1 refills | Status: DC

## 2023-12-22 NOTE — Progress Notes (Signed)
 Subjective:    Patient ID: Evelyn [name], female    DOB: [birth year], 83 y.o.   MRN: [id number]   Chief Complaint: annual physical    HPI:  Evelyn [name] is a 83 y.o. who identifies as a female who was assigned female at birth.   Social history: Lives with: by herself- her son lives next door and she talks to her daughter daily. Work history: retired   Water engineer in today for follow up of the following chronic medical issues:  1. Essential hypertension No c/o chest pain, sob or headache. Does not check blood pressure at home. BP Readings from Last 3 Encounters:  10/19/23 (!) 146/74  09/23/23 117/78  08/22/23 112/62     2. Paroxysmal atrial fibrillation Is on eliquis  with no bleeding issues. Denies palpitations or heart racing.  3. 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 Scheduled to see cardiology in november 5. Hemispheric carotid artery syndrome Last saw cardiology on 08/22/23. Review of office note showed no change in plan of care, with no significant stenosis  6. Acquired hypothyroidism No issues that hseis aware of. Lab Results  Component Value Date   TSH 1.210 06/30/2023     7. Mixed hyperlipidemia Does try to watch diet and stays fairly active Lab Results  Component Value Date   CHOL 138 06/30/2023   HDL 70 06/30/2023   LDLCALC 53 06/30/2023   TRIG 74 06/30/2023   CHOLHDL 2.0 06/30/2023   The ASCVD Risk score (Arnett DK, et al., 2019) failed to calculate for the following reasons:   The 2019 ASCVD risk score is only valid for ages 17 to 68   Risk score cannot be calculated because patient has a medical history suggesting prior/existing ASCVD   8. Anxiety Is currently on  no anxiey medication.     09/23/2023    9:47 AM 04/01/2023    9:18 AM 03/25/2023   11:14 AM 02/22/2023   12:17 PM  GAD 7 : Generalized Anxiety Score  Nervous, Anxious, on Edge 0 0 0 0   Control/stop worrying 0 0 0 0  Worry too much - different things 0 0 0 0  Trouble relaxing 0 0 0 0  Restless 0 0 0 0  Easily annoyed or irritable 0 0 0 0  Afraid - awful might happen 0 0 0 0  Total GAD 7 Score 0 0 0 0  Anxiety Difficulty Not difficult at all Not difficult at all Not difficult at all Not difficult at all      9. Hypokalemia No c/o muscle cramps Lab Results  Component Value Date   K 3.8 10/19/2023   10. DDD cervical spine 11. Pain management Patient still has chronic neck and back pain. Rating of pain varies from day to day. She use to be on pain medication, but that was stopped when drug screen failed to show medication in her system. She was in so much pain last time that I agreed to start her back on it. Pain assessment: Cause of pain- DDD Pain location- neck and lower back Pain on scale of 1-10- 5-6/10 Frequency- daily What increases pain-nothing really What makes pain Better-rest helps Effects on ADL - none Any change in general medical condition-none  Current opioids rx- norco 5/325 BID # meds rx- 60 Effectiveness of current meds-helps Adverse reactions from pain meds-none Morphine  equivalent- 15 MME  Pill count performed-No Last drug screen -  09/29/21 ( high risk q92m, moderate risk q84m, low risk yearly ) Urine drug screen today- Yes Was the [facility] reviewed- yes  If yes were their any concerning findings? - no   Overdose risk: 1   Pain contract signed on:11/23/22     New complaints: None today  Allergies  Allergen Reactions   Sulfa Antibiotics Swelling   Outpatient Encounter Medications as of 12/22/2023  Medication Sig   acetaminophen  (TYLENOL ) 500 MG tablet Take 500-1,000 mg by mouth every 6 (six) hours as needed for moderate pain (pain score 4-6).   Cholecalciferol ([employer] D3) 125 MCG (5000 UT) capsule TAKE 1 CAPSULE BY MOUTH EVERY DAY   diltiazem  (CARDIZEM ) 30 MG tablet TAKE 1 TABLET (30 MG TOTAL) BY MOUTH EVERY 6 (SIX) HOURS AS NEEDED  FOR RAPID HEART RATE OVER 120 BPM   ELIQUIS  2.5 MG TABS tablet TAKE 1 TABLET BY MOUTH TWICE A DAY   furosemide  (LASIX ) 20 MG tablet Take 20 mg by mouth daily.   [START ON 12/23/2023] HYDROcodone -acetaminophen  (NORCO/VICODIN) 5-325 MG tablet Take 1 tablet by mouth 2 (two) times daily as needed for moderate pain (pain score 4-6).   isosorbide  mononitrate (IMDUR ) 60 MG 24 hr tablet TAKE 1 TABLET (60 MG TOTAL) DAILY BY MOUTH.   levothyroxine  (SYNTHROID ) 75 MCG tablet TAKE 1 TABLET BY MOUTH DAILY BEFORE BREAKFAST-DR AUTHORIZED CHANGE IN MANUFACTURER   metoprolol  tartrate (LOPRESSOR ) 25 MG tablet TAKE 1 TABLET BY MOUTH TWICE A DAY   nitroGLYCERIN  (NITROSTAT ) 0.4 MG SL tablet    potassium chloride  (KLOR-CON ) 10 MEQ tablet Take 1 tablet (10 mEq total) by mouth daily.   rosuvastatin  (CRESTOR ) 20 MG tablet Take 1 tablet (20 mg total) by mouth daily.   Zoledronic  Acid (RECLAST  IV) Inject into the vein. Once yearly   No facility-administered encounter medications on file as of 12/22/2023.    Past Surgical History:  Procedure Laterality Date   ABDOMINAL HYSTERECTOMY     BACK SURGERY  2009   BACK SURGERY     CORONARY ANGIOPLASTY  2003   cutting balloon atherectomy in-stent stenosis of the LAD.   CORONARY ANGIOPLASTY WITH STENT PLACEMENT  2002   remote PCI & stenting LAD,   CORONARY ARTERY BYPASS GRAFT  2004   LIMA to LAD,vein graft to diagonal,vein graft to OM   FOOT SURGERY     KYPHOPLASTY N/A 11/24/2021   Procedure: LUMBAR ONE KYPHOPLASTY;  Surgeon: Colon Shove, MD;  Location: [facility] OR;  Service: Neurosurgery;  Laterality: N/A;  LUMBAR ONE KYPHOPLASTY   LEFT HEART CATH AND CORS/GRAFTS ANGIOGRAPHY N/A 02/23/2017   Procedure: LEFT HEART CATH AND CORS/GRAFTS ANGIOGRAPHY;  Surgeon: Darron Deatrice LABOR, MD;  Location: [facility];  Service: Cardiovascular;  Laterality: N/A;   LEFT HEART CATHETERIZATION WITH CORONARY ANGIOGRAM N/A 12/15/2011   Procedure: LEFT HEART CATHETERIZATION WITH CORONARY  ANGIOGRAM;  Surgeon: Jerel Balding, MD;  Location: [facility] CATH [facility];  Service: Cardiovascular;  Laterality: N/A;   LOOP RECORDER INSERTION N/A 06/14/2018   Procedure: LOOP RECORDER INSERTION;  Surgeon: Balding Jerel, MD;  Location: [facility];  Service: Cardiovascular;  Laterality: N/A;   NECK SURGERY     NM MYOCAR PERF WALL MOTION  12/01/2011   normal study-persistent exercise induced ECG changes raises the concern for ischemia (balanced ischemia).   NOSE SURGERY     PACEMAKER INSERTION     NOT PACEMAKER - LOOP RECORDER   TONSILLECTOMY  1948   TUBAL LIGATION      Family History  Problem Relation Age of Onset   Heart attack Mother    Heart failure Father    Hypertension Father    Heart disease Father    Asthma Maternal Grandmother    Heart disease Sister    Stroke Brother    Voice disorder Daughter        vocal dystonia   Stroke Daughter    Stroke Son    Cancer Sister 6      Review of Systems  Constitutional:  Negative for diaphoresis.  Eyes:  Negative for pain.  Respiratory:  Negative for shortness of breath.   Cardiovascular:  Negative for chest pain, palpitations and leg swelling.  Gastrointestinal:  Negative for abdominal pain.  Endocrine: Negative for polydipsia.  Skin:  Negative for rash.  Neurological:  Negative for dizziness, weakness and headaches.  Hematological:  Does not bruise/bleed easily.  All other systems reviewed and are negative.      Objective:   Physical Exam Vitals and nursing note reviewed.  Constitutional:      General: She is not in acute distress.    Appearance: Normal appearance. She is well-developed.  HENT:     Head: Normocephalic.     Right Ear: Tympanic membrane normal.     Left Ear: Tympanic membrane normal.     Nose: Nose normal.     Mouth/Throat:     Mouth: Mucous membranes are moist.  Eyes:     Pupils: Pupils are equal, round, and reactive to light.  Neck:     Vascular: No carotid bruit or JVD.  Cardiovascular:      Rate and Rhythm: Normal rate and regular rhythm.     Heart sounds: Normal heart sounds.  Pulmonary:     Effort: Pulmonary effort is normal. No respiratory distress.     Breath sounds: Normal breath sounds. No wheezing or rales.  Chest:     Chest wall: No tenderness.  Abdominal:     General: Bowel sounds are normal. There is no distension or abdominal bruit.     Palpations: Abdomen is soft. There is no hepatomegaly, splenomegaly, mass or pulsatile mass.     Tenderness: There is no abdominal tenderness.  Musculoskeletal:        General: Normal range of motion.     Cervical back: Normal range of motion and neck supple.  Lymphadenopathy:     Cervical: No cervical adenopathy.  Skin:    General: Skin is warm and dry.  Neurological:     General: No focal deficit present.     Mental Status: She is alert and oriented to person, place, and time.     Cranial Nerves: No cranial nerve deficit.     Sensory: No sensory deficit.     Deep Tendon Reflexes: Reflexes are normal and symmetric.  Psychiatric:        Behavior: Behavior normal.        Thought Content: Thought content normal.        Judgment: Judgment normal.    BP 120/62   Pulse (!) 54   Temp 97.7 F (36.5 C) (Temporal)   Ht 5' 2 (1.575 m)   Wt 115 lb (52.2 kg)   SpO2 94%   BMI 21.03 kg/m          Assessment & Plan:   Evelyn [name] comes in today with chief complaint of annual physical  Diagnosis and orders addressed:  1. Essential hypertension Low sodium diet - furosemide  (LASIX ) 20 MG tablet; Take 1  tablet (20 mg total) by mouth daily.  Dispense: 90 tablet; Refill: 1  2. Paroxysmal atrial fibrillation Avoid caffeine - apixaban  (ELIQUIS ) 2.5 MG TABS tablet; Take 1 tablet (2.5 mg total) by mouth 2 (two) times daily.  Dispense: 180 tablet; Refill: 1 - diltiazem  (CARDIZEM ) 30 MG tablet; Take 1 tablet (30 mg total) by mouth every 6 (six) hours as needed (for rapid heart rate).  Dispense: 30 tablet; Refill: 3 -  metoprolol  tartrate (LOPRESSOR ) 100 MG tablet; Take 1 tablet (100 mg total) by mouth 2 (two) times daily.  Dispense: 180 tablet; Refill: 1  3. 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  Dispense: 90 tablet; Refill: 1 5. Hemispheric carotid artery syndrome Keep yearly follow up with cardiology  6. Acquired hypothyroidism Labs pending  7. Mixed hyperlipidemia Low fat diet - Lipid panel - rosuvastatin  (CRESTOR ) 20 MG tablet; Take 1 tablet (20 mg total) by mouth daily.  Dispense: 90 tablet; Refill: 1  8. Anxiety stess management  9. Hypokalemia Labs pending - potassium chloride  (KLOR-CON ) 10 MEQ tablet; Take 1 tablet (10 mEq total) by mouth daily.  Dispense: 90 tablet; Refill: 3  10. DDD (degenerative disc disease), cervical Moist heat 'rest - Drug Screen 10 W/Conf, Se - HYDROcodone -acetaminophen  (NORCO) 5-325 MG tablet; Take 1 tablet by mouth every 6 (six) hours as needed for moderate pain.  Dispense: 60 tablet; Refill: 0 - HYDROcodone -acetaminophen  (NORCO) 5-325 MG tablet; Take 1 tablet by mouth every 6 (six) hours as needed for moderate pain.  Dispense: 60 tablet; Refill: 0 - HYDROcodone -acetaminophen  (NORCO) 5-325 MG tablet; Take 1 tablet by mouth every 6 (six) hours as needed for moderate pain.  Dispense: 60 tablet; Refill: 0   Labs pending Health Maintenance reviewed Diet and exercise encouraged  Follow up plan: 3 months   Mary-Margaret Gladis, FNP

## 2023-12-22 NOTE — Patient Instructions (Signed)

## 2023-12-22 NOTE — Addendum Note (Signed)
 Addended by: Dagny Fiorentino, MARY-MARGARET on: 12/22/2023 12:21 PM   Modules accepted: Orders

## 2023-12-23 ENCOUNTER — Ambulatory Visit: Payer: Self-pay | Admitting: Nurse Practitioner

## 2023-12-23 LAB — CBC WITH DIFFERENTIAL/PLATELET
Basophils Absolute: 0 x10E3/uL (ref 0.0–0.2)
Basos: 0 %
EOS (ABSOLUTE): 0.1 x10E3/uL (ref 0.0–0.4)
Eos: 1 %
Hematocrit: 42.2 % (ref 34.0–46.6)
Hemoglobin: 13.6 g/dL (ref 11.1–15.9)
Immature Grans (Abs): 0 x10E3/uL (ref 0.0–0.1)
Immature Granulocytes: 0 %
Lymphocytes Absolute: 4.3 x10E3/uL — ABNORMAL HIGH (ref 0.7–3.1)
Lymphs: 48 %
MCH: 31.3 pg (ref 26.6–33.0)
MCHC: 32.2 g/dL (ref 31.5–35.7)
MCV: 97 fL (ref 79–97)
Monocytes Absolute: 0.8 x10E3/uL (ref 0.1–0.9)
Monocytes: 9 %
Neutrophils Absolute: 3.8 x10E3/uL (ref 1.4–7.0)
Neutrophils: 42 %
Platelets: 235 x10E3/uL (ref 150–450)
RBC: 4.35 x10E6/uL (ref 3.77–5.28)
RDW: 12.6 % (ref 11.7–15.4)
WBC: 9 x10E3/uL (ref 3.4–10.8)

## 2023-12-23 LAB — LIPID PANEL
Cholesterol, Total: 123 mg/dL (ref 100–199)
HDL: 69 mg/dL (ref >39)
LDL CALC COMMENT:: 1.8 ratio (ref 0.0–4.4)
LDL Chol Calc (NIH): 41 mg/dL (ref 0–99)
Triglycerides: 63 mg/dL (ref 0–149)
VLDL Cholesterol Cal: 13 mg/dL (ref 5–40)

## 2023-12-23 LAB — THYROID PANEL WITH TSH
Free Thyroxine Index: 3 (ref 1.2–4.9)
T3 Uptake Ratio: 29 (ref 24–39)
T4, Total: 10.4 ug/dL (ref 4.5–12.0)
TSH: 0.537 u[IU]/mL (ref 0.450–4.500)

## 2023-12-23 LAB — CMP14+EGFR
ALT: 12 IU/L (ref 0–32)
AST: 17 IU/L (ref 0–40)
Albumin: 4.5 g/dL (ref 3.7–4.7)
Alkaline Phosphatase: 62 IU/L (ref 44–121)
BUN/Creatinine Ratio: 20 (ref 12–28)
BUN: 13 mg/dL (ref 8–27)
Bilirubin Total: 0.5 mg/dL (ref 0.0–1.2)
CO2: 21 mmol/L (ref 20–29)
Calcium: 9.8 mg/dL (ref 8.7–10.3)
Chloride: 104 mmol/L (ref 96–106)
Creatinine, Ser: 0.65 mg/dL (ref 0.57–1.00)
Globulin, Total: 2.5 g/dL (ref 1.5–4.5)
Glucose: 85 mg/dL (ref 70–99)
Potassium: 4.1 mmol/L (ref 3.5–5.2)
Sodium: 139 mmol/L (ref 134–144)
Total Protein: 7 g/dL (ref 6.0–8.5)
eGFR: 86 mL/min/1.73 (ref >59)

## 2023-12-23 LAB — VITAMIN D 25 HYDROXY (VIT D DEFICIENCY, FRACTURES): Vit D, 25-Hydroxy: 50.1 ng/mL (ref 30.0–100.0)

## 2023-12-26 LAB — TOXASSURE SELECT 13 (MW), URINE

## 2023-12-30 ENCOUNTER — Other Ambulatory Visit

## 2023-12-31 LAB — TSH: TSH: 0.256 u[IU]/mL — ABNORMAL LOW (ref 0.450–4.500)

## 2023-12-31 LAB — T4, FREE: Free T4: 1.58 ng/dL (ref 0.82–1.77)

## 2024-01-04 ENCOUNTER — Ambulatory Visit: Payer: Medicare HMO | Admitting: "Endocrinology

## 2024-01-04 ENCOUNTER — Encounter: Payer: Self-pay | Admitting: "Endocrinology

## 2024-01-04 VITALS — BP 96/78 | HR 60 | Ht 62.0 in | Wt 115.8 lb

## 2024-01-04 DIAGNOSIS — I10 Essential (primary) hypertension: Secondary | ICD-10-CM | POA: Diagnosis not present

## 2024-01-04 DIAGNOSIS — E039 Hypothyroidism, unspecified: Secondary | ICD-10-CM

## 2024-01-04 DIAGNOSIS — E782 Mixed hyperlipidemia: Secondary | ICD-10-CM

## 2024-01-04 DIAGNOSIS — E042 Nontoxic multinodular goiter: Secondary | ICD-10-CM

## 2024-01-04 MED ORDER — LEVOTHYROXINE SODIUM 125 MCG PO TABS
62.5000 ug | ORAL_TABLET | Freq: Every day | ORAL | 1 refills | Status: DC
Start: 2024-01-04 — End: 2024-04-04

## 2024-01-04 NOTE — Progress Notes (Signed)
 01/04/2024      Endocrinology follow-up note   Subjective:    Patient ID: Evelyn [name], female    DOB: 15-Nov-1940, PCP [name] Mustard, FNP   Past Medical History:  Diagnosis Date   Aneurysm [facility])    Aortic insufficiency    Atrial fibrillation (HCC)    CAD (coronary artery disease)    CABG LIMA to LAD,vein graft to diagonal,vein graft to OM   Dyslipidemia    Essential hypertension 09/16/2013   GERD (gastroesophageal reflux disease)    Hypothyroid    Past Surgical History:  Procedure Laterality Date   ABDOMINAL HYSTERECTOMY     BACK SURGERY  2009   BACK SURGERY     CORONARY ANGIOPLASTY  2003   cutting balloon atherectomy in-stent stenosis of the LAD.   CORONARY ANGIOPLASTY WITH STENT PLACEMENT  2002   remote PCI & stenting LAD,   CORONARY ARTERY BYPASS GRAFT  2004   LIMA to LAD,vein graft to diagonal,vein graft to OM   FOOT SURGERY     KYPHOPLASTY N/A 11/24/2021   Procedure: LUMBAR ONE KYPHOPLASTY;  Surgeon: Colon Shove, MD;  Location: [facility] OR;  Service: Neurosurgery;  Laterality: N/A;  LUMBAR ONE KYPHOPLASTY   LEFT HEART CATH AND CORS/GRAFTS ANGIOGRAPHY N/A 02/23/2017   Procedure: LEFT HEART CATH AND CORS/GRAFTS ANGIOGRAPHY;  Surgeon: Darron Deatrice LABOR, MD;  Location: [facility] INVASIVE CV LAB;  Service: Cardiovascular;  Laterality: N/A;   LEFT HEART CATHETERIZATION WITH CORONARY ANGIOGRAM N/A 12/15/2011   Procedure: LEFT HEART CATHETERIZATION WITH CORONARY ANGIOGRAM;  Surgeon: Jerel Balding, MD;  Location: [facility] CATH LAB;  Service: Cardiovascular;  Laterality: N/A;   LOOP RECORDER INSERTION N/A 06/14/2018   Procedure: LOOP RECORDER INSERTION;  Surgeon: Balding Jerel, MD;  Location: [facility] INVASIVE CV LAB;  Service: Cardiovascular;  Laterality: N/A;   NECK SURGERY     NM MYOCAR PERF WALL MOTION  12/01/2011   normal study-persistent exercise induced ECG changes raises the concern for ischemia (balanced ischemia).   NOSE SURGERY     PACEMAKER INSERTION     NOT PACEMAKER  - LOOP RECORDER   TONSILLECTOMY  1948   TUBAL LIGATION     Social History   Socioeconomic History   Marital status: Widowed    Spouse name: Not on file   Number of children: 2   Years of education: Associates   Highest education level: Associate degree: academic program  Occupational History   Occupation: Retired  Tobacco Use   Smoking status: Never   Smokeless tobacco: Never  Vaping Use   Vaping status: Never Used  Substance and Sexual Activity   Alcohol use: No   Drug use: No   Sexual activity: Not Currently  Other Topics Concern   Not on file  Social History Narrative   Mother of 2 with 4 grandchildren   Right-handed   Caffeine: 8-10 cups coffee   Pacer    Are you right handed or left handed? right   Are you currently employed ?    What is your current occupation? retired   Do you live at home alone? With son   Who lives with you?    What type of home do you live in: 1 story or 2 story? one       Social Drivers of Corporate investment banker Strain: Low Risk  (06/23/2023)   Overall Financial Resource Strain (CARDIA)    Difficulty of Paying Living Expenses: Not hard at all  Food Insecurity: No Food Insecurity (  06/23/2023)   Hunger Vital Sign    Worried About Running Out of Food in the Last Year: Never true    Ran Out of Food in the Last Year: Never true  Transportation Needs: No Transportation Needs (06/23/2023)   PRAPARE - Administrator, Civil Service (Medical): No    Lack of Transportation (Non-Medical): No  Physical Activity: Insufficiently Active (06/23/2023)   Exercise Vital Sign    Days of Exercise per Week: 3 days    Minutes of Exercise per Session: 30 min  Stress: No Stress Concern Present (06/23/2023)   [employer] of Occupational Health - Occupational Stress Questionnaire    Feeling of Stress : Not at all  Social Connections: Moderately Integrated (06/23/2023)   Social Connection and Isolation Panel    Frequency of Communication with  Friends and Family: More than three times a week    Frequency of Social Gatherings with Friends and Family: Three times a week    Attends Religious Services: 1 to 4 times per year    Active Member of Clubs or Organizations: Yes    Attends Banker Meetings: 1 to 4 times per year    Marital Status: Widowed   Outpatient Encounter Medications as of 01/04/2024  Medication Sig   acetaminophen  (TYLENOL ) 500 MG tablet Take 500-1,000 mg by mouth every 6 (six) hours as needed for moderate pain (pain score 4-6).   apixaban  (ELIQUIS ) 2.5 MG TABS tablet Take 1 tablet (2.5 mg total) by mouth 2 (two) times daily.   Cholecalciferol (CVS D3) 125 MCG (5000 UT) capsule TAKE 1 CAPSULE BY MOUTH EVERY DAY   diltiazem  (CARDIZEM ) 30 MG tablet TAKE 1 TABLET (30 MG TOTAL) BY MOUTH EVERY 6 (SIX) HOURS AS NEEDED FOR RAPID HEART RATE OVER 120 BPM   furosemide  (LASIX ) 20 MG tablet Take 1 tablet (20 mg total) by mouth daily. (Patient not taking: Reported on 01/04/2024)   [START ON 03/22/2024] HYDROcodone -acetaminophen  (NORCO/VICODIN) 5-325 MG tablet Take 1 tablet by mouth 2 (two) times daily as needed for moderate pain (pain score 4-6).   [START ON 02/21/2024] HYDROcodone -acetaminophen  (NORCO/VICODIN) 5-325 MG tablet Take 1 tablet by mouth 2 (two) times daily as needed for moderate pain (pain score 4-6).   [START ON 01/22/2024] HYDROcodone -acetaminophen  (NORCO/VICODIN) 5-325 MG tablet Take 1 tablet by mouth 2 (two) times daily as needed for moderate pain (pain score 4-6).   isosorbide  mononitrate (IMDUR ) 60 MG 24 hr tablet Take 1 tablet (60 mg total) by mouth daily.   levothyroxine  (SYNTHROID ) 125 MCG tablet Take 0.5 tablets (62.5 mcg total) by mouth daily before breakfast.   metoprolol  tartrate (LOPRESSOR ) 25 MG tablet TAKE 1 TABLET BY MOUTH TWICE A DAY   nitroGLYCERIN  (NITROSTAT ) 0.4 MG SL tablet    potassium chloride  (KLOR-CON ) 10 MEQ tablet Take 1 tablet (10 mEq total) by mouth daily.   rosuvastatin  (CRESTOR ) 20  MG tablet Take 1 tablet (20 mg total) by mouth daily.   Zoledronic  Acid (RECLAST  IV) Inject into the vein. Once yearly   [DISCONTINUED] levothyroxine  (SYNTHROID ) 75 MCG tablet TAKE 1 TABLET BY MOUTH DAILY BEFORE BREAKFAST-DR AUTHORIZED CHANGE IN MANUFACTURER   No facility-administered encounter medications on file as of 01/04/2024.   ALLERGIES: Allergies  Allergen Reactions   Sulfa Antibiotics Swelling   VACCINATION STATUS: Immunization History  Administered Date(s) Administered   Fluad Quad(high Dose 65+) 02/29/2020, 03/27/2021, 05/11/2022   Fluad Trivalent(High Dose 65+) 02/22/2023   Influenza, High Dose Seasonal PF 03/09/2016, 03/02/2017   Moderna  Sars-Covid-2 Vaccination 01/02/2020, 01/30/2020   Pneumococcal Conjugate-13 01/11/2018    HPI  83 year old female with medical history as above. She is being seen in follow-up for management of her hypothyroidism and multinodular goiter.  She remains on levothyroxine  75 mcg p.o. daily before breakfast.  Her previsit thyroid  function tests are consistent with slight over replacement.      She is compliant and has no side effects.    - She is status post FNA 3 nodules in her thyroid  benign findings.  Her previsit surveillance thyroid /neck ultrasound shows stable findings with no concerning nodules at this time.    -She was recently diagnosed with acute coronary syndrome on medical management. -Her previsit labs are consistent with appropriate replacement.  She has hyperlipidemia currently on Crestor  20 mg p.o. nightly.   She remains only on metoprolol  25 mg p.o. twice daily, her blood pressure today is controlled at 96/78 mmHg.   - She denies family history of thyroid  dysfunction. She feels occasional choking sensation in her neck, no recent voice change nor shortness of breath. She denies any exposure to neck radiation. - She denies any history of smoking.  Review of Systems Limited as above.  Objective:    BP 96/78   Pulse 60    Ht 5' 2 (1.575 m)   Wt 115 lb 12.8 oz (52.5 kg)   BMI 21.18 kg/m   Wt Readings from Last 3 Encounters:  01/04/24 115 lb 12.8 oz (52.5 kg)  12/22/23 115 lb (52.2 kg)  09/23/23 116 lb (52.6 kg)    Physical Exam   CMP     Component Value Date/Time   NA 139 12/22/2023 1226   K 4.1 12/22/2023 1226   CL 104 12/22/2023 1226   CO2 21 12/22/2023 1226   GLUCOSE 85 12/22/2023 1226   GLUCOSE 86 10/19/2023 1059   BUN 13 12/22/2023 1226   CREATININE 0.65 12/22/2023 1226   CREATININE 0.59 07/11/2014 0913   CALCIUM  9.8 12/22/2023 1226   PROT 7.0 12/22/2023 1226   ALBUMIN 4.5 12/22/2023 1226   AST 17 12/22/2023 1226   ALT 12 12/22/2023 1226   ALKPHOS 62 12/22/2023 1226   BILITOT 0.5 12/22/2023 1226   GFRNONAA >60 02/17/2023 1840   GFRAA 98 06/26/2020 1116   Recent Results (from the past 2160 hours)  Comp Met (CMET)     Status: None   Collection Time: 10/19/23 10:59 AM  Result Value Ref Range   Sodium 139 135 - 145 mEq/L   Potassium 3.8 3.5 - 5.1 mEq/L   Chloride 107 96 - 112 mEq/L   CO2 26 19 - 32 mEq/L   Glucose, Bld 86 70 - 99 mg/dL   BUN 13 6 - 23 mg/dL   Creatinine, Ser 9.40 0.40 - 1.20 mg/dL   Total Bilirubin 0.6 0.2 - 1.2 mg/dL   Alkaline Phosphatase 48 39 - 117 U/L   AST 15 0 - 37 U/L   ALT 11 0 - 35 U/L   Total Protein 7.1 6.0 - 8.3 g/dL   Albumin 4.2 3.5 - 5.2 g/dL   GFR 17.70 >39.99 mL/min    Comment: Calculated using the CKD-EPI Creatinine Equation (2021)   Calcium  9.4 8.4 - 10.5 mg/dL  CBC w/Diff     Status: None   Collection Time: 10/19/23 10:59 AM  Result Value Ref Range   WBC 7.3 4.0 - 10.5 K/uL   RBC 4.17 3.87 - 5.11 Mil/uL   Hemoglobin 13.1 12.0 - 15.0 g/dL  HCT 38.5 36.0 - 46.0 %   MCV 92.4 78.0 - 100.0 fl   MCHC 34.0 30.0 - 36.0 g/dL   RDW 85.8 88.4 - 84.4 %   Platelets 200.0 150.0 - 400.0 K/uL   Neutrophils Relative % 51.4 43.0 - 77.0 %   Lymphocytes Relative 37.1 12.0 - 46.0 %   Monocytes Relative 9.9 3.0 - 12.0 %   Eosinophils Relative 1.0  0.0 - 5.0 %   Basophils Relative 0.6 0.0 - 3.0 %   Neutro Abs 3.8 1.4 - 7.7 K/uL   Lymphs Abs 2.7 0.7 - 4.0 K/uL   Monocytes Absolute 0.7 0.1 - 1.0 K/uL   Eosinophils Absolute 0.1 0.0 - 0.7 K/uL   Basophils Absolute 0.0 0.0 - 0.1 K/uL  H. pylori antigen, stool     Status: None   Collection Time: 10/28/23 12:21 PM  Result Value Ref Range   H pylori Ag, Stl Negative Negative  CBC with Differential/Platelet     Status: Abnormal   Collection Time: 12/22/23 12:26 PM  Result Value Ref Range   WBC 9.0 3.4 - 10.8 x10E3/uL   RBC 4.35 3.77 - 5.28 x10E6/uL   Hemoglobin 13.6 11.1 - 15.9 g/dL   Hematocrit 57.7 65.9 - 46.6 %   MCV 97 79 - 97 fL   MCH 31.3 26.6 - 33.0 pg   MCHC 32.2 31.5 - 35.7 g/dL   RDW 87.3 88.2 - 84.5 %   Platelets 235 150 - 450 x10E3/uL   Neutrophils 42 Not Estab. %   Lymphs 48 Not Estab. %   Monocytes 9 Not Estab. %   Eos 1 Not Estab. %   Basos 0 Not Estab. %   Neutrophils Absolute 3.8 1.4 - 7.0 x10E3/uL   Lymphocytes Absolute 4.3 (H) 0.7 - 3.1 x10E3/uL   Monocytes Absolute 0.8 0.1 - 0.9 x10E3/uL   EOS (ABSOLUTE) 0.1 0.0 - 0.4 x10E3/uL   Basophils Absolute 0.0 0.0 - 0.2 x10E3/uL   Immature Granulocytes 0 Not Estab. %   Immature Grans (Abs) 0.0 0.0 - 0.1 x10E3/uL  CMP14+EGFR     Status: None   Collection Time: 12/22/23 12:26 PM  Result Value Ref Range   Glucose 85 70 - 99 mg/dL   BUN 13 8 - 27 mg/dL   Creatinine, Ser 9.34 0.57 - 1.00 mg/dL   eGFR 86 >40 fO/fpw/8.26   BUN/Creatinine Ratio 20 12 - 28   Sodium 139 134 - 144 mmol/L   Potassium 4.1 3.5 - 5.2 mmol/L   Chloride 104 96 - 106 mmol/L   CO2 21 20 - 29 mmol/L   Calcium  9.8 8.7 - 10.3 mg/dL   Total Protein 7.0 6.0 - 8.5 g/dL   Albumin 4.5 3.7 - 4.7 g/dL   Globulin, Total 2.5 1.5 - 4.5 g/dL   Bilirubin Total 0.5 0.0 - 1.2 mg/dL   Alkaline Phosphatase 62 44 - 121 IU/L   AST 17 0 - 40 IU/L   ALT 12 0 - 32 IU/L  Lipid panel     Status: None   Collection Time: 12/22/23 12:26 PM  Result Value Ref Range    Cholesterol, Total 123 100 - 199 mg/dL   Triglycerides 63 0 - 149 mg/dL   HDL 69 >60 mg/dL   VLDL Cholesterol Cal 13 5 - 40 mg/dL   LDL Chol Calc (NIH) 41 0 - 99 mg/dL   Chol/HDL Ratio 1.8 0.0 - 4.4 ratio    Comment:  T. Chol/HDL Ratio                                             Men  Women                               1/2 Avg.Risk  3.4    3.3                                   Avg.Risk  5.0    4.4                                2X Avg.Risk  9.6    7.1                                3X Avg.Risk 23.4   11.0   Thyroid  Panel With TSH     Status: None   Collection Time: 12/22/23 12:26 PM  Result Value Ref Range   TSH 0.537 0.450 - 4.500 uIU/mL   T4, Total 10.4 4.5 - 12.0 ug/dL   T3 Uptake Ratio 29 24 - 39 %   Free Thyroxine Index 3.0 1.2 - 4.9  VITAMIN D  25 Hydroxy (Vit-D Deficiency, Fractures)     Status: None   Collection Time: 12/22/23 12:26 PM  Result Value Ref Range   Vit D, 25-Hydroxy 50.1 30.0 - 100.0 ng/mL    Comment: Vitamin D  deficiency has been defined by the Institute of Medicine and an Endocrine Society practice guideline as a level of serum 25-OH vitamin D  less than 20 ng/mL (1,2). The Endocrine Society went on to further define vitamin D  insufficiency as a level between 21 and 29 ng/mL (2). 1. IOM (Institute of Medicine). 2010. Dietary reference    intakes for calcium  and D. [place]: The    [employer]. 2. Holick MF, Binkley Genola, Bischoff-Ferrari HA, et al.    Evaluation, treatment, and prevention of vitamin D     deficiency: an Endocrine Society clinical practice    guideline. JCEM. 2011 Jul; 96(7):1911-30.   ToxASSURE Select 13 (MW), Urine     Status: None   Collection Time: 12/22/23  3:28 PM  Result Value Ref Range   Summary FINAL     Comment: ==================================================================== ToxASSURE Select 13 (MW) ==================================================================== Test                              Result       Flag       Units  Drug Present and Declared for Prescription Verification   Hydrocodone                     1521         EXPECTED   ng/mg creat   Hydromorphone                   205          EXPECTED   ng/mg creat   Dihydrocodeine  286          EXPECTED   ng/mg creat   Norhydrocodone                 1812         EXPECTED   ng/mg creat    Sources of hydrocodone  include scheduled prescription medications.    Hydromorphone , dihydrocodeine and norhydrocodone are expected    metabolites of hydrocodone . Hydromorphone  and dihydrocodeine are    also available as scheduled prescription medications.  ==================================================================== Test                      Result    Flag   Units      Ref Range   Creatinine              42                mg/dL      >=79 ==================================================================== Declared Medications:  The flagging and interpretation on this report are based on the  following declared medications.  Unexpected results may arise from  inaccuracies in the declared medications.   **Note: The testing scope of this panel includes these medications:   Hydrocodone    **Note: The testing scope of this panel does not include the  following reported medications:   Acetaminophen  (Tylenol )  Acetaminophen   Apixaban  (Eliquis )  Diltiazem  (Cardizem )  Furosemide  (Lasix )  Isosorbide   Levothyroxine  (Synthroid )  Metoprolol  (Lopressor )  Nitroglycerin  (Nitrostat )  Potassium (Klor-Con )  Rosuvastatin  (Crestor )  Vitamin D3  Zolendronic Acid (Reclast ) ==================================================================== For clinical consultation, please call [phone]. ====================================================================   TSH     Status: Abnormal   Collection Time: 12/30/23  9:17 AM  Result Value Ref Range   TSH 0.256 (L) 0.450 - 4.500 uIU/mL  T4, Free      Status: None   Collection Time: 12/30/23  9:17 AM  Result Value Ref Range   Free T4 1.58 0.82 - 1.77 ng/dL   Fine-needle aspiration samples  of 3 nodules in her thyroid  is benign  Repeat thyroid  ultrasound on April 18, 2019: Right lobe 4 cm x 1.9 mm x 2.6 cm, left lobe 4.1 cm x 1.5 cm x 2.3 cm  There are small stable nodules 3 on the right and one on the left.  2 right-sided nodules and one nodule on the left were previously biopsied with benign findings.   Assessment & Plan:   1.  hypothyroidism 2. Multinodular goiter 3.  Hyperlipidemia   -Her previsit thyroid  function tests are consistent with slight over replacement.  I discussed and lowered her levothyroxine  to 62.5 mcg p.o. daily before breakfast    - We discussed about the correct intake of her thyroid  hormone, on empty stomach at fasting, with water, separated by at least 30 minutes from breakfast and other medications,  and separated by more than 4 hours from calcium , iron, multivitamins, acid reflux medications (PPIs). -Patient is made aware of the fact that thyroid  hormone replacement is needed for life, dose to be adjusted by periodic monitoring of thyroid  function tests.  -  Prior Biopsy off  3 nodules is negative for malignancy.  She had stable findings on her surveillance thyroid  ultrasound-right thyroid  nodule has decreased in size with interval cystic changes which downgrades it from TR 4 to TR 2 category.  She will not need intervention at this time.   She is advised to continue Crestor  20 mg p.o. nightly.  She is advised to maintain close follow-up  with her cardiologist, and PCP.    I lowered her metoprolol  to 25 mg p.o. twice daily.    -Patient takes opioids for pain syndrome, at risk for adrenal insufficiency.  Her previsit a.m. cortisol is normal.   - I advised patient to maintain close follow up with [name] Mustard, FNP for primary care needs.   I spent  22  minutes in the care of the patient today  including review of labs from Thyroid  Function, CMP, and other relevant labs ; imaging/biopsy records (current and previous including abstractions from other facilities); face-to-face time discussing  her lab results and symptoms, medications doses, her options of short and long term treatment based on the latest standards of care / guidelines;   and documenting the encounter.  Evelyn [name]  participated in the discussions, expressed understanding, and voiced agreement with the above plans.  All questions were answered to her satisfaction. she is encouraged to contact clinic should she have any questions or concerns prior to her return visit.   Follow up plan: Return in about 6 months (around 07/06/2024) for F/U with Pre-visit Labs.  Ranny Earl, MD Phone: [phone]  Fax: [phone]  -  This note was partially dictated with voice recognition software. Similar sounding words can be transcribed inadequately or may not  be corrected upon review.  01/04/2024, 12:08 PM

## 2024-02-01 ENCOUNTER — Ambulatory Visit

## 2024-02-01 ENCOUNTER — Encounter: Payer: Self-pay | Admitting: Family Medicine

## 2024-02-01 VITALS — BP 116/60 | HR 51 | Temp 97.5°F | Ht 62.0 in | Wt 115.2 lb

## 2024-02-01 DIAGNOSIS — M7632 Iliotibial band syndrome, left leg: Secondary | ICD-10-CM | POA: Diagnosis not present

## 2024-02-01 MED ORDER — METHYLPREDNISOLONE ACETATE 80 MG/ML IJ SUSP
80.0000 mg | Freq: Once | INTRAMUSCULAR | Status: AC
  Administered 2024-02-01: 80 mg via INTRAMUSCULAR

## 2024-02-01 NOTE — Progress Notes (Signed)
 BP 116/60 (Cuff Size: Normal)   Pulse (!) 51   Temp (!) 97.5 F (36.4 C) (Temporal)   Ht 5' 2 (1.575 m)   Wt 115 lb 3.2 oz (52.3 kg)   SpO2 95%   BMI 21.07 kg/m    Subjective:   Patient ID: Evelyn [name], female    DOB: Nov 25, 1940, 83 y.o.   MRN: [id number]  HPI: Evelyn [name] is a 83 y.o. female presenting on 02/01/2024 for Leg Pain (left)   Discussed the use of AI scribe software for clinical note transcription with the patient, who gave verbal consent to proceed.  History of Present Illness   Evelyn [name] is an 83 year old female who presents with leg pain.  She has been experiencing leg pain for the past three weeks, located on the outside and back of her leg, extending from the hip down to the ankle, with the outside being the worst. The pain does not change with walking or resting and is particularly troublesome at night, affecting her sleep. No recent falls, twists, or injuries have been reported.  She has a history of a broken back, which occurred approximately three years ago, and underwent surgery involving a fusion or rod placement about two years ago. The surgery has done well, although she still experiences some tenderness in the area.  The pain is more pronounced when bending her knee and is located below the knee, particularly on the outside. She also mentions having knee problems. No pain is felt when lifting her right leg, although she feels pressure.          Relevant past medical, surgical, family and social history reviewed and updated as indicated. Interim medical history since our last visit reviewed. Allergies and medications reviewed and updated.  Review of Systems  Constitutional:  Negative for chills and fever.  Respiratory:  Negative for chest tightness and shortness of breath.   Cardiovascular:  Negative for chest pain and leg swelling.  Musculoskeletal:  Positive for arthralgias and myalgias. Negative for back pain and gait problem.   Skin:  Negative for rash.  Neurological:  Negative for light-headedness and headaches.  Psychiatric/Behavioral:  Negative for agitation and behavioral problems.   All other systems reviewed and are negative.   Per HPI unless specifically indicated above   Allergies as of 02/01/2024       Reactions   Sulfa Antibiotics Swelling        Medication List        Accurate as of February 01, 2024 11:39 AM. If you have any questions, ask your nurse or doctor.          STOP taking these medications    furosemide  20 MG tablet Commonly known as: LASIX        TAKE these medications    acetaminophen  500 MG tablet Commonly known as: TYLENOL  Take 500-1,000 mg by mouth every 6 (six) hours as needed for moderate pain (pain score 4-6).   apixaban  2.5 MG Tabs tablet Commonly known as: Eliquis  Take 1 tablet (2.5 mg total) by mouth 2 (two) times daily.   CVS D3 125 MCG (5000 UT) capsule Generic drug: Cholecalciferol TAKE 1 CAPSULE BY MOUTH EVERY DAY   diltiazem  30 MG tablet Commonly known as: CARDIZEM  TAKE 1 TABLET (30 MG TOTAL) BY MOUTH EVERY 6 (SIX) HOURS AS NEEDED FOR RAPID HEART RATE OVER 120 BPM   HYDROcodone -acetaminophen  5-325 MG tablet Commonly known as: NORCO/VICODIN Take 1 tablet by mouth 2 (two)  times daily as needed for moderate pain (pain score 4-6).   HYDROcodone -acetaminophen  5-325 MG tablet Commonly known as: NORCO/VICODIN Take 1 tablet by mouth 2 (two) times daily as needed for moderate pain (pain score 4-6). Start taking on: February 21, 2024   HYDROcodone -acetaminophen  5-325 MG tablet Commonly known as: NORCO/VICODIN Take 1 tablet by mouth 2 (two) times daily as needed for moderate pain (pain score 4-6). Start taking on: March 22, 2024   isosorbide  mononitrate 60 MG 24 hr tablet Commonly known as: IMDUR  Take 1 tablet (60 mg total) by mouth daily.   levothyroxine  125 MCG tablet Commonly known as: SYNTHROID  Take 0.5 tablets (62.5 mcg total) by mouth  daily before breakfast.   metoprolol  tartrate 25 MG tablet Commonly known as: LOPRESSOR  TAKE 1 TABLET BY MOUTH TWICE A DAY   nitroGLYCERIN  0.4 MG SL tablet Commonly known as: NITROSTAT    potassium chloride  10 MEQ tablet Commonly known as: KLOR-CON  Take 1 tablet (10 mEq total) by mouth daily.   RECLAST  IV Inject into the vein. Once yearly   rosuvastatin  20 MG tablet Commonly known as: CRESTOR  Take 1 tablet (20 mg total) by mouth daily.         Objective:   BP 116/60 (Cuff Size: Normal)   Pulse (!) 51   Temp (!) 97.5 F (36.4 C) (Temporal)   Ht 5' 2 (1.575 m)   Wt 115 lb 3.2 oz (52.3 kg)   SpO2 95%   BMI 21.07 kg/m   Wt Readings from Last 3 Encounters:  02/01/24 115 lb 3.2 oz (52.3 kg)  01/04/24 115 lb 12.8 oz (52.5 kg)  12/22/23 115 lb (52.2 kg)    Physical Exam Physical Exam   MUSCULOSKELETAL: Right leg with pain on palpation from hip to ankle, worse on lateral aspect. Pain in right knee on flexion, tenderness present.     Pain is worse between the hip and the knee on the lateral aspect.    Assessment & Plan:   Problem List Items Addressed This Visit   None Visit Diagnoses       It band syndrome, left    -  Primary   Relevant Medications   methylPREDNISolone  acetate (DEPO-MEDROL ) injection 80 mg (Start on 02/01/2024 11:45 AM)          Right iliotibial band syndrome Persistent lateral leg pain, exacerbated by movement and palpation, likely due to increased activity. - Administered steroid injection to reduce inflammation.  Depo-Medrol  80 mg intramuscular - Provided list of stretches and exercises for home use. - Advised using a frozen water bottle for icing and relief.          Follow up plan: Return if symptoms worsen or fail to improve.  Counseling provided for all of the vaccine components No orders of the defined types were placed in this encounter.   Fonda Levins, MD [facility] 02/01/2024, 11:39  AM

## 2024-02-23 ENCOUNTER — Ambulatory Visit: Admitting: Family Medicine

## 2024-02-23 ENCOUNTER — Encounter: Payer: Self-pay | Admitting: Family Medicine

## 2024-02-23 VITALS — BP 147/75 | HR 52 | Temp 98.0°F | Ht 62.0 in | Wt 115.2 lb

## 2024-02-23 DIAGNOSIS — H9190 Unspecified hearing loss, unspecified ear: Secondary | ICD-10-CM

## 2024-02-23 DIAGNOSIS — H6123 Impacted cerumen, bilateral: Secondary | ICD-10-CM | POA: Diagnosis not present

## 2024-02-23 DIAGNOSIS — Z23 Encounter for immunization: Secondary | ICD-10-CM

## 2024-02-23 NOTE — Patient Instructions (Addendum)
 Carbamide Peroxide Ear Solution What is this medication? CARBAMIDE PEROXIDE (CAR bah mide per OX ide) treats earwax buildup. It works by softening and loosening the earwax, which makes it easier to remove. This medicine may be used for other purposes; ask your health care provider or pharmacist if you have questions. COMMON BRAND NAME(S): Auro Ear, Auro Earache Relief, Clearcanal, Clinere, Debrox, Ear Drops, Ear Wax Removal, Ear Wax Remover, Earwax Treatment, Murine, Thera-Ear How should I use this medication? This medication is only for use in the outer ear canal. Follow the directions carefully. Wash hands before and after use. The solution may be warmed by holding the bottle in the hand for 1 to 2 minutes. Lie with the affected ear facing upward. Place four or five drops into the ear canal. After the drops are instilled, remain lying with the affected ear upward for 10 minutes to help the drops stay in the ear canal. Repeat for the opposite ear. Do not touch the tip of the dropper to the ear, fingertips, or other surface. Do not rinse the dropper after use.  Do this daily for 10 days What if I miss a dose? If you miss a dose, use it as soon as you can. If it is almost time for your next dose, use only that dose. Do not use double or extra doses. What may interact with this medication? Interactions are not expected. Do not use any other ear products without asking your care team. This list may not describe all possible interactions. Give your health care provider a list of all the medicines, herbs, non-prescription drugs, or dietary supplements you use. Also tell them if you smoke, drink alcohol, or use illegal drugs. Some items may interact with your medicine. What should I watch for while using this medication? This medication is not for long-term use. Contact your care team if your condition does not start to get better within a few days or if you notice burning, redness, itching, or  swelling. What side effects may I notice from receiving this medication? Side effects that you should report to your care team as soon as possible: Allergic reactions--skin rash, itching, hives, swelling of the face, lips, tongue, or throat Worsening ear pain Side effects that usually do not require medical attention (report to your care team if they continue or are bothersome): Dizziness Itching or pain in the ear after use This list may not describe all possible side effects. Call your doctor for medical advice about side effects. You may report side effects to FDA at 1-800-FDA-1088. Where should I keep my medication? Keep out of the reach of children and pets. Store at room temperature between 15 and 30 degrees C (59 and 86 degrees F) in a tight, light-resistant container. Keep bottle away from excessive heat and direct sunlight. Throw away any unused medication after the expiration date. NOTE: This sheet is a summary. It may not cover all possible information. If you have questions about this medicine, talk to your doctor, pharmacist, or health care provider.  2024 Elsevier/Gold Standard (2021-06-03 00:00:00)

## 2024-02-23 NOTE — Progress Notes (Signed)
 Chief Complaint  Patient presents with   ear discomfort    HPI  Patient presents today for Discussed the use of AI scribe software for clinical note transcription with the patient, who gave verbal consent to proceed.  History of Present Illness Evelyn [name] is an 83 year old female who presents with hearing difficulties due to ear wax buildup.  She has been experiencing difficulty hearing with her hearing aids for the past three to four weeks. The sound from her TV is described as 'a little fuzzy', and she has had trouble inserting her hearing aids properly.  Yesterday, her hearing aids were cleaned, and a significant amount of ear wax was noted in both ears.     PMH: Smoking status noted Review of Systems  Objective: BP (!) 147/75   Pulse (!) 52   Temp 98 F (36.7 C)   Ht 5' 2 (1.575 m)   Wt 115 lb 3.2 oz (52.3 kg)   SpO2 96%   BMI 21.07 kg/m  Gen: NAD, alert, cooperative with exam HEENT: NCAT, EOMI, PERRL. Bilateral cerumen impaction CV: RRR, good S1/S2, no murmur Resp: CTABL, no wheezes, non-labored Ext: No edema, warm Neuro: Alert and oriented, No gross deficits  Hard of hearing  Encounter for immunization -     Flu vaccine HIGH DOSE PF(Fluzone Trivalent)  Bilateral impacted cerumen    Assessment and Plan Assessment & Plan Impacted cerumen, bilateral   Bilateral impacted cerumen has caused difficulty with hearing aids for three to four weeks. Significant ear wax accumulation may further impair hearing. Multiple attempts at lavage and cerumen spoon were unsuccessful. Patient complained of excessive pain. Efforts aborted after3 attempts and pt given handout to use debrox to soften wax.  Follow up 2 weeks for lavage      Butler Der, MD

## 2024-02-23 NOTE — Progress Notes (Deleted)
    Subjective:    Patient ID: Evelyn [name], female    DOB: 1940/12/18, 83 y.o.   MRN: [id number]   HPI: Evelyn [name] is a 83 y.o. female presenting for       02/23/2024   10:35 AM 02/01/2024   11:19 AM 12/22/2023   12:06 PM 09/23/2023    9:47 AM 06/30/2023   10:02 AM  Depression screen PHQ 2/9  Decreased Interest 0 0 0 0 0  Down, Depressed, Hopeless 0 0 0 0 0  PHQ - 2 Score 0 0 0 0 0     Relevant past medical, surgical, family and social history reviewed and updated as indicated.  Interim medical history since our last visit reviewed. Allergies and medications reviewed and updated.  ROS:  Review of Systems Do this daily for 10 days  Social History   Tobacco Use  Smoking Status Never  Smokeless Tobacco Never       Objective:     Wt Readings from Last 3 Encounters:  02/23/24 115 lb 3.2 oz (52.3 kg)  02/01/24 115 lb 3.2 oz (52.3 kg)  01/04/24 115 lb 12.8 oz (52.5 kg)     Exam deferred. Video visit performed.   Assessment & Plan:  Encounter for immunization -     Flu vaccine HIGH DOSE PF(Fluzone Trivalent)      Evelyn [name] was seen today for ear discomfort.  Diagnoses and all orders for this visit:  Encounter for immunization -     Flu vaccine HIGH DOSE PF(Fluzone Trivalent)    Virtual Visit  Note  I discussed the limitations, risks, security and privacy concerns of performing an evaluation and management service by video and the availability of in person appointments. The patient was identified with two identifiers. Pt.expressed understanding and agreed to proceed. Pt. Is at home. Dr. Zollie is in his office.  Follow Up Instructions:   I discussed the assessment and treatment plan with the patient. The patient was provided an opportunity to ask questions and all were answered. The patient agreed with the plan and demonstrated an understanding of the instructions.   The patient was advised to call back or seek an in-person evaluation if the symptoms  worsen or if the condition fails to improve as anticipated.   Total minutes contact time: ***   Follow up plan: No follow-ups on file.  Butler Zollie, MD [facility]

## 2024-02-25 ENCOUNTER — Other Ambulatory Visit: Payer: Self-pay | Admitting: Adult Health

## 2024-02-25 ENCOUNTER — Other Ambulatory Visit: Payer: Self-pay | Admitting: "Endocrinology

## 2024-02-28 ENCOUNTER — Ambulatory Visit: Payer: Self-pay

## 2024-02-28 NOTE — Telephone Encounter (Signed)
 Saw Dr Zollie on 02/23/2024 for this. Patient is requesting Rx for ears. Dr Zollie, can you advise please.

## 2024-02-28 NOTE — Telephone Encounter (Signed)
 FYI Only or Action Required?: FYI only for provider.  Patient was last seen in primary care on 02/23/2024 by Zollie Lowers, MD.  Called Nurse Triage reporting Cerumen Impaction.  Symptoms began several weeks ago.  Interventions attempted: OTC medications: Debrox.  Symptoms are: unchanged.  Triage Disposition: Information or Advice Only Call  Patient/caregiver understands and will follow disposition?: Yes Reason for Disposition  Health information question, no triage required and triager able to answer question  Answer Assessment - Initial Assessment Questions Advised patient they were unable to get any earwax out at last visit, the Debrox drops are to soften the wax for the lavage she has on 10/16. Checked for sooner appointment than Thursday and there were none. Advised patient to keep using Debrox until appointment on 10/16  1. REASON FOR CALL: What is the main reason for your call? or How can I best help you?     Patient still having trouble hearing, wanted PCP to send in medication for ears  2. SYMPTOMS : Do you have any symptoms?      Denies new or worsening  Protocols used: Information Only Call - No Triage-A-AH  Copied from CRM [id number]. Topic: Clinical - Medication Question >> Feb 28, 2024  9:04 AM Tobias CROME wrote: Reason for CRM: Patient states she is still unable to hear well out of her ears. Patient states she was seen last week by Dr. Zollie for ear irrigation and however she is still unable to hear.Patient states she was in the office for close to an hour and at one point it was painful. Patient inquiring if Mary-Margaret could send something in for her ears.

## 2024-02-28 NOTE — Telephone Encounter (Signed)
 No prescription will work better than the debrox. Keep using it and the ears will be much easier to flush out this time.

## 2024-03-01 ENCOUNTER — Ambulatory Visit: Admitting: Nurse Practitioner

## 2024-03-01 ENCOUNTER — Encounter: Payer: Self-pay | Admitting: Nurse Practitioner

## 2024-03-01 VITALS — BP 141/75 | HR 73 | Temp 98.1°F | Ht 62.0 in | Wt 115.0 lb

## 2024-03-01 DIAGNOSIS — H6123 Impacted cerumen, bilateral: Secondary | ICD-10-CM

## 2024-03-01 NOTE — Progress Notes (Signed)
 Subjective:    Patient ID: Evelyn [name], female    DOB: July 11, 1940, 83 y.o.   MRN: [id number]   Chief Complaint: Can't hear out of both ears (Had ears cleaned out last Friday and hasn't been able to hear since then)   HPI Patient come sin today c/o both ears stopped up. Started last week. Hearing has decreased. She was seen last Friday and had  both ears washed out and hearing has gotten worsed.  Patient Active Problem List   Diagnosis Date Noted   Fall at home, initial encounter 02/18/2023   Fracture of nasal bone 02/18/2023   Fracture of right orbital floor (HCC) 02/18/2023   Fracture of maxillary sinus (HCC) 02/18/2023   Atrial fibrillation, chronic (HCC) 02/18/2023   Syncope 02/17/2023   Hypotension 11/25/2022   Intractable back pain 11/20/2021   Acquired thrombophilia 11/20/2021   Hypokalemia 11/20/2021   Compression fracture of L1 vertebra (HCC) 11/20/2021   Paroxysmal atrial fibrillation (HCC) 12/18/2019   Cervical radiculopathy at C5 08/07/2019   Long term (current) use of anticoagulants 11/25/2018   DDD (degenerative disc disease), cervical 10/17/2018   Mixed hyperlipidemia 10/17/2018   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 CABG X 3 2004 12/15/2011   Hypothyroidism 12/15/2011       Review of Systems  Constitutional:  Negative for diaphoresis.  Eyes:  Negative for pain.   Respiratory:  Negative for shortness of breath.   Cardiovascular:  Negative for chest pain, palpitations and leg swelling.  Gastrointestinal:  Negative for abdominal pain.  Endocrine: Negative for polydipsia.  Skin:  Negative for rash.  Neurological:  Negative for dizziness, weakness and headaches.  Hematological:  Does not bruise/bleed easily.  All other systems reviewed and are negative.      Objective:   Physical Exam Constitutional:      Appearance: Normal appearance.  HENT:     Right Ear: There is impacted cerumen.     Left Ear: There is impacted cerumen.  Cardiovascular:     Rate and Rhythm: Normal rate and regular rhythm.     Heart sounds: Normal heart sounds.  Pulmonary:     Effort: Pulmonary effort is normal.     Breath sounds: Normal breath sounds.  Skin:    General: Skin is warm.  Neurological:     General: No focal deficit present.     Mental Status: She is alert and oriented to person, place, and time.  Psychiatric:        Mood and Affect: Mood normal.        Behavior: Behavior normal.     BP (!) 141/75   Pulse 73   Temp 98.1 F (36.7 C) (Temporal)   Ht 5' 2 (1.575 m)   Wt 115 lb (52.2 kg)   SpO2 97%   BMI 21.03 kg/m    S/P- bilateral ear lavage and curetting    Assessment & Plan:  Evelyn [name] in today with chief  complaint of Can't hear out of both ears (Had ears cleaned out last Friday and hasn't been able to hear since then)   1. Bilateral impacted cerumen (Primary) Debrox OTC 2-3 times a week.  Do not use qtips in ears RTO for routine follow up    The above assessment and management plan was discussed with the patient. The patient verbalized understanding of and has agreed to the management plan. Patient is aware to call the clinic if symptoms persist or worsen. Patient is aware when to return to the clinic for a follow-up visit. Patient educated on when it is appropriate to go to the emergency department.   Mary-Margaret Gladis,  FNP

## 2024-03-01 NOTE — Patient Instructions (Signed)
 Earwax Buildup, Adult Your ears make something called earwax. It helps keep germs called bacteria away and protects the skin in your ears. Sometimes, too much earwax can build up. This can cause discomfort or make it harder to hear. What are the causes? Earwax buildup can happen when you have too much earwax in your ears. Earwax is made in the outer part of your ear canal. It's supposed to fall out in small amounts over time. But if your ears aren't able to clean themselves like they should, earwax can build up. What increases the risk? You're more likely to get earwax buildup if: You clean your ears with cotton swabs. You pick at your ears. You use earplugs or in-ear headphones a lot. You wear hearing aids. You may also be more likely to get it if: You're female. You're older. Your ears naturally make more earwax. You have narrow ear canals or extra hair in your ears. Your earwax is too thick or sticky. You have eczema. You're dehydrated. This means there's not enough fluid in your body. What are the signs or symptoms? Symptoms of earwax buildup include: Not being able to hear as well. A feeling of fullness in your ear. Feeling like your ear is plugged. Fluid coming from your ear. Ear pain or an itchy ear. Ringing in your ear. Coughing or problems with balance. How is this diagnosed? Earwax buildup may be diagnosed based on your symptoms, medical history, and an ear exam. During the exam, your health care provider will look into your ear with a tool called an otoscope. You may also have tests, such as a hearing test. How is this treated? Earwax buildup may be treated by: Using ear drops. Having the earwax removed by a provider. The provider may: Flush the ear with water. Use a tool called a curette that has a loop on the end. Use a suction device. Having surgery. This may be done in severe cases. Follow these instructions at home:  Cleaning your ears Clean your ears as told  by your provider. You can clean the outside of your ears with a washcloth or tissue. Do not overclean your ears. Do not put anything into your ear unless told. This includes cotton swabs. General instructions Take over-the-counter and prescription medicines only as told by your provider. Drink enough fluid to keep your pee (urine) pale yellow. This helps thin the earwax. If you have hearing aids, clean them as told. Keep all follow-up visits. If earwax builds up in your ears often or if you use hearing aids, ask your provider how often you should have your ears cleaned. Contact a health care provider if: Your ear pain gets worse. You have a fever. You have pus, blood, or other fluid coming from your ear. You have hearing loss. You have ringing in your ears that won't go away. You feel like the room is spinning. This is called vertigo. Your symptoms don't get better with treatment. This information is not intended to replace advice given to you by your health care provider. Make sure you discuss any questions you have with your health care provider. Document Revised: 07/15/2022 Document Reviewed: 07/15/2022 Elsevier Patient Education  2024 [employer].

## 2024-03-26 NOTE — Telephone Encounter (Signed)
 Copied from CRM [id number]. Topic: Referral - Request for Referral >> Mar 26, 2024 10:31 AM Graeme ORN wrote: Did the patient discuss referral with their provider in the last year? Yes (If No - schedule appointment) (If Yes - send message)  Appointment offered? No  Type of order/referral and detailed reason for visit: ENT - advised to see by [facility] - unsure if she needs referral or if there is one provider would suggest   Preference of office, provider, location: [place] if possible if not, [facility]   If referral order, have you been seen by this specialty before? No (If Yes, this issue or another issue? When? Where?  Can we respond through MyChart? No

## 2024-04-01 ENCOUNTER — Other Ambulatory Visit: Payer: Self-pay | Admitting: "Endocrinology

## 2024-04-01 DIAGNOSIS — I10 Essential (primary) hypertension: Secondary | ICD-10-CM

## 2024-04-03 ENCOUNTER — Ambulatory Visit: Admitting: Nurse Practitioner

## 2024-04-03 ENCOUNTER — Encounter: Payer: Self-pay | Admitting: Nurse Practitioner

## 2024-04-03 VITALS — BP 133/75 | HR 64 | Temp 97.2°F | Ht 62.0 in | Wt 117.0 lb

## 2024-04-03 DIAGNOSIS — H9203 Otalgia, bilateral: Secondary | ICD-10-CM | POA: Diagnosis not present

## 2024-04-03 MED ORDER — FLUTICASONE PROPIONATE 50 MCG/ACT NA SUSP: 2.0000 | Freq: Every day | NASAL | 6 refills | Status: AC

## 2024-04-03 NOTE — Addendum Note (Signed)
 Addended by: Ivorie Uplinger, MARY-MARGARET on: 04/03/2024 08:48 AM   Modules accepted: Orders

## 2024-04-03 NOTE — Progress Notes (Addendum)
 Subjective:    Patient ID: Evelyn [name], female    DOB: 1940/06/25, 83 y.o.   MRN: [id number]   Chief Complaint: Ear Pain (Bilateral and having itching)   HPI  Patient has been having issues with her ears. She has had them cleaned out 2x in the last several months. Today she is c/o bil ear pain and itching. Wants referral to ENT.  Patient Active Problem List   Diagnosis Date Noted   Fall at home, initial encounter 02/18/2023   Fracture of nasal bone 02/18/2023   Fracture of right orbital floor (HCC) 02/18/2023   Fracture of maxillary sinus (HCC) 02/18/2023   Atrial fibrillation, chronic (HCC) 02/18/2023   Syncope 02/17/2023   Hypotension 11/25/2022   Intractable back pain 11/20/2021   Acquired thrombophilia 11/20/2021   Hypokalemia 11/20/2021   Compression fracture of L1 vertebra (HCC) 11/20/2021   Paroxysmal atrial fibrillation (HCC) 12/18/2019   Cervical radiculopathy at C5 08/07/2019   Long term (current) use of anticoagulants 11/25/2018   DDD (degenerative disc disease), cervical 10/17/2018   Mixed hyperlipidemia 10/17/2018   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 CABG X 3 2004 12/15/2011   Hypothyroidism 12/15/2011       Review of Systems     Objective:   Physical Exam Constitutional:      Appearance: Normal appearance.  HENT:     Right Ear: Tympanic membrane and ear canal  normal.     Left Ear: Tympanic membrane and ear canal normal.  Cardiovascular:     Rate and Rhythm: Normal rate and regular rhythm.     Heart sounds: Normal heart sounds.  Pulmonary:     Breath sounds: Normal breath sounds.  Skin:    General: Skin is warm.  Neurological:     General: No focal deficit present.     Mental Status: She is alert and oriented to person, place, and time.     BP 133/75   Pulse 64   Temp (!) 97.2 F (36.2 C) (Temporal)   Ht 5' 2 (1.575 m)   Wt 117 lb (53.1 kg)   SpO2 95%   BMI 21.40 kg/m        Assessment & Plan:   Evelyn [name] in today with chief complaint of Ear Pain (Bilateral and having itching)   1. Otalgia of both ears (Primary) Do not use qtips - Ambulatory referral to ENT  Meds ordered this encounter  Medications   fluticasone (FLONASE) 50 MCG/ACT nasal spray    Sig: Place 2 sprays into both nostrils daily.    Dispense:  16 g    Refill:  6    Supervising Provider:   MARYANNE CHEW A [[id number]]     The above assessment and management plan was discussed with the patient. The patient verbalized understanding of and has agreed to the management plan. Patient is aware to call the clinic if symptoms persist or worsen. Patient is aware when to return  to the clinic for a follow-up visit. Patient educated on when it is appropriate to go to the emergency department.   Mary-Margaret Gladis, FNP

## 2024-04-03 NOTE — Patient Instructions (Signed)
Earache, Adult An earache, or ear pain, can be caused by many things, including: An infection. Ear wax buildup. Ear pressure. Something in the ear that should not be there (foreign body). A sore throat. Tooth problems. Jaw problems. Treatment of the earache will depend on the cause. If the cause is not clear or cannot be known, you may need to watch your symptoms until your earache goes away or until a cause is found. Follow these instructions at home: Medicines Take or apply over-the-counter and prescription medicines only as told by your health care provider. If you were prescribed antibiotics, use them as told by your health care provider. Do not stop using the antibiotic even if you start to feel better. Do not put anything in your ear other than medicine that is prescribed by your health care provider. Managing pain     If directed, apply heat to the affected area as often as told by your health care provider. Use the heat source that your health care provider recommends, such as a moist heat pack or a heating pad. Place a towel between your skin and the heat source. Leave the heat on for 20-30 minutes. If your skin turns bright red, remove the heat right away to prevent burns. The risk of burns is higher if you cannot feel pain, heat, or cold. If directed, put ice on the affected area. To do this: Put ice in a plastic bag. Place a towel between your skin and the bag. Leave the ice on for 20 minutes, 2-3 times a day. If your skin turns bright red, remove the ice right away to prevent skin damage. The risk of skin damage is higher if you cannot feel pain, heat, or cold.  General instructions Pay attention to any changes in your symptoms. Try resting in an upright position instead of lying down. This may help to reduce pressure in your ear and relieve pain. Chew gum if it helps to relieve your ear pain. Treat any allergies as told by your health care provider. Drink enough fluid  to keep your urine pale yellow. It is up to you to get the results of any tests that were done. Ask your health care provider, or the department that is doing the tests, when your results will be ready. Contact a health care provider if: Your pain does not improve within 2 days. Your earache gets worse. You have new symptoms. You have a fever. Get help right away if: You have a severe headache. You have a stiff neck. You have trouble swallowing. You have redness or swelling behind your ear. You have fluid or blood coming from your ear. You have hearing loss. You feel dizzy. This information is not intended to replace advice given to you by your health care provider. Make sure you discuss any questions you have with your health care provider. Document Revised: 09/14/2021 Document Reviewed: 09/14/2021 Elsevier Patient Education  2024 Elsevier Inc.  

## 2024-04-19 ENCOUNTER — Encounter (INDEPENDENT_AMBULATORY_CARE_PROVIDER_SITE_OTHER): Payer: Self-pay

## 2024-04-20 ENCOUNTER — Ambulatory Visit: Payer: Self-pay | Admitting: Nurse Practitioner

## 2024-04-20 ENCOUNTER — Encounter: Payer: Self-pay | Admitting: Nurse Practitioner

## 2024-04-20 VITALS — BP 140/22 | HR 61 | Temp 97.6°F | Ht 62.0 in | Wt 116.0 lb

## 2024-04-20 DIAGNOSIS — M5030 Other cervical disc degeneration, unspecified cervical region: Secondary | ICD-10-CM

## 2024-04-20 MED ORDER — HYDROCODONE-ACETAMINOPHEN 5-325 MG PO TABS: 1.0000 | ORAL_TABLET | Freq: Two times a day (BID) | ORAL | 0 refills | Status: AC | PRN

## 2024-04-20 NOTE — Progress Notes (Signed)
 Subjective:    Patient ID: Evelyn [name], female    DOB: [birth year], 83 y.o.   MRN: [id number]   Chief Complaint: pain management  HPI  Pain assessment: Cause of pain- DDD Pain location- lower back Pain on scale of 1-10- 7-8/10 Frequency- daily What increases pain-nothing really What makes pain Better-rest helps Effects on ADL - none Any change in general medical condition-none  Current opioids rx- norco 5/325 BID # meds rx- 60 Effectiveness of current meds-helps Adverse reactions from pain meds-none Morphine  equivalent-  Pill count performed-No Last drug screen - 08/23/22 ( high risk q15m, moderate risk q7m, low risk yearly ) Urine drug screen today- No Was the [facility] reviewed- yes  If yes were their any concerning findings? - no   Overdose risk: 1   Pain contract signed on:11/23/22  Patient Active Problem List   Diagnosis Date Noted   Fall at home, initial encounter 02/18/2023   Fracture of nasal bone 02/18/2023   Fracture of right orbital floor (HCC) 02/18/2023   Fracture of maxillary sinus (HCC) 02/18/2023   Atrial fibrillation, chronic (HCC) 02/18/2023   Syncope 02/17/2023   Hypotension 11/25/2022   Intractable back pain 11/20/2021   Acquired thrombophilia 11/20/2021   Hypokalemia 11/20/2021   Compression fracture of L1 vertebra (HCC) 11/20/2021   Paroxysmal atrial fibrillation (HCC) 12/18/2019   Cervical radiculopathy at C5 08/07/2019   Long term (current) use of anticoagulants 11/25/2018   DDD (degenerative disc disease), cervical 10/17/2018   Mixed hyperlipidemia 10/17/2018   Hemispheric carotid artery syndrome 06/14/2018   Pain management 08/01/2017   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 CABG X 3 2004 12/15/2011   Hypothyroidism 12/15/2011       Review of Systems  Constitutional:  Negative for diaphoresis.  Eyes:  Negative for pain.  Respiratory:  Negative for shortness of breath.   Cardiovascular:  Negative for chest pain, palpitations and leg swelling.  Gastrointestinal:  Negative for abdominal pain.  Endocrine: Negative for polydipsia.  Musculoskeletal:  Positive for back pain.  Skin:  Negative for rash.  Neurological:  Negative for dizziness, weakness and headaches.  Hematological:  Does not bruise/bleed easily.  All other systems reviewed and are negative.      Objective:   Physical Exam Constitutional:      Appearance: Normal appearance.  Cardiovascular:     Rate and Rhythm: Normal rate and regular rhythm.     Heart sounds: Normal heart sounds.  Pulmonary:     Breath sounds: Normal breath sounds.  Musculoskeletal:     Comments: Rises slowly from sitting  to standing Lumbar pain on rotation and extension (-) SLR bil Motor strength and sensation distally intact  Skin:    General: Skin is warm.  Neurological:     General: No focal deficit present.     Mental Status: She is alert and oriented to person, place, and time.  Psychiatric:        Mood and Affect: Mood normal.        Behavior:  Behavior normal.    BP (!) 140/22   Pulse 61   Temp 97.6 F (36.4 C) (Temporal)   Ht 5' 2 (1.575 m)   Wt 116 lb (52.6 kg)   SpO2 93%   BMI 21.22 kg/m           Assessment & Plan:   Evelyn [name] in today with chief complaint of pain management  1. DDD (degenerative disc disease), cervical Moist heat Rest  RTO prn  Meds ordered this encounter  Medications   HYDROcodone -acetaminophen   (NORCO/VICODIN) 5-325 MG tablet    Sig: Take 1 tablet by mouth 2 (two) times daily as needed for moderate pain (pain score 4-6).    Dispense:  60 tablet    Refill:  0    Supervising Provider:   DETTINGER, JOSHUA A [[id number]]   HYDROcodone -acetaminophen  (NORCO/VICODIN) 5-325 MG tablet    Sig: Take 1 tablet by mouth 2 (two) times daily as needed for moderate pain (pain score 4-6).    Dispense:  60 tablet    Refill:  0    Supervising Provider:   DETTINGER, JOSHUA A [[id number]]   HYDROcodone -acetaminophen  (NORCO/VICODIN) 5-325 MG tablet    Sig: Take 1 tablet by mouth 2 (two) times daily as needed for moderate pain (pain score 4-6).    Dispense:  60 tablet    Refill:  0    Supervising Provider:   MARYANNE CHEW A [[id number]]     The above assessment and management plan was discussed with the patient. The patient verbalized understanding of and has agreed to the management plan. Patient is aware to call the clinic if symptoms persist or worsen. Patient is aware when to return to the clinic for a follow-up visit. Patient educated on when it is appropriate to go to the emergency department.   Mary-Margaret Lunger, FNP

## 2024-05-01 ENCOUNTER — Encounter (INDEPENDENT_AMBULATORY_CARE_PROVIDER_SITE_OTHER): Payer: Self-pay | Admitting: Physician Assistant

## 2024-05-01 ENCOUNTER — Ambulatory Visit (INDEPENDENT_AMBULATORY_CARE_PROVIDER_SITE_OTHER): Admitting: Physician Assistant

## 2024-05-01 VITALS — BP 125/71 | HR 57 | Ht 62.0 in | Wt 115.0 lb

## 2024-05-01 DIAGNOSIS — H6123 Impacted cerumen, bilateral: Secondary | ICD-10-CM
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
## 2024-05-26 ENCOUNTER — Other Ambulatory Visit: Payer: Self-pay | Admitting: "Endocrinology

## 2024-06-07 ENCOUNTER — Telehealth: Payer: Self-pay | Admitting: "Endocrinology

## 2024-06-07 ENCOUNTER — Other Ambulatory Visit: Payer: Self-pay | Admitting: *Deleted

## 2024-06-07 DIAGNOSIS — I10 Essential (primary) hypertension: Secondary | ICD-10-CM

## 2024-06-07 DIAGNOSIS — I959 Hypotension, unspecified: Secondary | ICD-10-CM

## 2024-06-07 DIAGNOSIS — M5030 Other cervical disc degeneration, unspecified cervical region: Secondary | ICD-10-CM

## 2024-06-07 DIAGNOSIS — I480 Paroxysmal atrial fibrillation: Secondary | ICD-10-CM

## 2024-06-07 DIAGNOSIS — E042 Nontoxic multinodular goiter: Secondary | ICD-10-CM

## 2024-06-07 DIAGNOSIS — E039 Hypothyroidism, unspecified: Secondary | ICD-10-CM

## 2024-06-07 DIAGNOSIS — E782 Mixed hyperlipidemia: Secondary | ICD-10-CM

## 2024-06-07 NOTE — Telephone Encounter (Signed)
 Pt needs labs updated

## 2024-06-07 NOTE — Telephone Encounter (Signed)
 Labs have been update

## 2024-07-06 ENCOUNTER — Ambulatory Visit: Admitting: "Endocrinology

## 2024-07-06 ENCOUNTER — Ambulatory Visit: Admitting: Nurse Practitioner

## 2024-08-08 ENCOUNTER — Ambulatory Visit: Admitting: "Endocrinology
